# Patient Record
Sex: Female | Born: 1950 | Race: Black or African American | Hispanic: No | Marital: Single | Smoking: Former smoker
Health system: Southern US, Community
[De-identification: ages and names within clinical notes are randomized; demographics above are authoritative.]

## PROBLEM LIST (undated history)

## (undated) DIAGNOSIS — M1990 Unspecified osteoarthritis, unspecified site: Secondary | ICD-10-CM

## (undated) DIAGNOSIS — I330 Acute and subacute infective endocarditis: Secondary | ICD-10-CM

## (undated) DIAGNOSIS — I10 Essential (primary) hypertension: Secondary | ICD-10-CM

## (undated) DIAGNOSIS — S83209A Unspecified tear of unspecified meniscus, current injury, unspecified knee, initial encounter: Secondary | ICD-10-CM

## (undated) DIAGNOSIS — R9431 Abnormal electrocardiogram [ECG] [EKG]: Secondary | ICD-10-CM

## (undated) DIAGNOSIS — R9389 Abnormal findings on diagnostic imaging of other specified body structures: Secondary | ICD-10-CM

## (undated) DIAGNOSIS — R011 Cardiac murmur, unspecified: Secondary | ICD-10-CM

## (undated) DIAGNOSIS — N280 Ischemia and infarction of kidney: Secondary | ICD-10-CM

## (undated) DIAGNOSIS — E785 Hyperlipidemia, unspecified: Secondary | ICD-10-CM

## (undated) DIAGNOSIS — D689 Coagulation defect, unspecified: Secondary | ICD-10-CM

## (undated) HISTORY — PX: POLYPECTOMY: SHX149

## (undated) HISTORY — DX: Abnormal electrocardiogram (ECG) (EKG): R94.31

## (undated) HISTORY — DX: Coagulation defect, unspecified: D68.9

## (undated) HISTORY — DX: Hyperlipidemia, unspecified: E78.5

## (undated) HISTORY — DX: Abnormal findings on diagnostic imaging of other specified body structures: R93.89

---

## 1976-03-27 HISTORY — PX: TUBAL LIGATION: SHX77

## 1997-03-27 HISTORY — PX: KNEE SURGERY: SHX244

## 1998-01-22 ENCOUNTER — Emergency Department (HOSPITAL_COMMUNITY): Admission: EM | Admit: 1998-01-22 | Discharge: 1998-01-22 | Payer: Self-pay | Admitting: Emergency Medicine

## 1998-03-27 DIAGNOSIS — S83209A Unspecified tear of unspecified meniscus, current injury, unspecified knee, initial encounter: Secondary | ICD-10-CM

## 1998-03-27 DIAGNOSIS — M1990 Unspecified osteoarthritis, unspecified site: Secondary | ICD-10-CM

## 1998-03-27 HISTORY — DX: Unspecified tear of unspecified meniscus, current injury, unspecified knee, initial encounter: S83.209A

## 1998-03-27 HISTORY — DX: Unspecified osteoarthritis, unspecified site: M19.90

## 1998-06-24 ENCOUNTER — Ambulatory Visit (HOSPITAL_BASED_OUTPATIENT_CLINIC_OR_DEPARTMENT_OTHER): Admission: RE | Admit: 1998-06-24 | Discharge: 1998-06-24 | Payer: Self-pay | Admitting: *Deleted

## 1998-08-28 ENCOUNTER — Encounter: Payer: Self-pay | Admitting: Emergency Medicine

## 1998-08-28 ENCOUNTER — Emergency Department (HOSPITAL_COMMUNITY): Admission: EM | Admit: 1998-08-28 | Discharge: 1998-08-28 | Payer: Self-pay | Admitting: Emergency Medicine

## 1999-01-11 ENCOUNTER — Other Ambulatory Visit: Admission: RE | Admit: 1999-01-11 | Discharge: 1999-01-11 | Payer: Self-pay | Admitting: Obstetrics

## 1999-03-26 ENCOUNTER — Emergency Department (HOSPITAL_COMMUNITY): Admission: EM | Admit: 1999-03-26 | Discharge: 1999-03-26 | Payer: Self-pay | Admitting: Emergency Medicine

## 1999-03-26 ENCOUNTER — Encounter: Payer: Self-pay | Admitting: Emergency Medicine

## 1999-11-26 ENCOUNTER — Inpatient Hospital Stay (HOSPITAL_COMMUNITY): Admission: AD | Admit: 1999-11-26 | Discharge: 1999-11-26 | Payer: Self-pay | Admitting: Obstetrics

## 2000-01-10 ENCOUNTER — Other Ambulatory Visit: Admission: RE | Admit: 2000-01-10 | Discharge: 2000-01-10 | Payer: Self-pay | Admitting: Obstetrics

## 2000-02-09 ENCOUNTER — Emergency Department (HOSPITAL_COMMUNITY): Admission: EM | Admit: 2000-02-09 | Discharge: 2000-02-09 | Payer: Self-pay | Admitting: Emergency Medicine

## 2000-02-28 ENCOUNTER — Ambulatory Visit (HOSPITAL_COMMUNITY): Admission: RE | Admit: 2000-02-28 | Discharge: 2000-02-28 | Payer: Self-pay | Admitting: Obstetrics

## 2000-02-28 ENCOUNTER — Encounter: Payer: Self-pay | Admitting: Obstetrics

## 2000-04-06 ENCOUNTER — Emergency Department (HOSPITAL_COMMUNITY): Admission: EM | Admit: 2000-04-06 | Discharge: 2000-04-07 | Payer: Self-pay | Admitting: Emergency Medicine

## 2000-12-07 ENCOUNTER — Emergency Department (HOSPITAL_COMMUNITY): Admission: EM | Admit: 2000-12-07 | Discharge: 2000-12-08 | Payer: Self-pay

## 2002-08-03 ENCOUNTER — Emergency Department (HOSPITAL_COMMUNITY): Admission: EM | Admit: 2002-08-03 | Discharge: 2002-08-04 | Payer: Self-pay | Admitting: Emergency Medicine

## 2002-08-04 ENCOUNTER — Encounter: Payer: Self-pay | Admitting: Emergency Medicine

## 2003-03-25 ENCOUNTER — Ambulatory Visit (HOSPITAL_COMMUNITY): Admission: RE | Admit: 2003-03-25 | Discharge: 2003-03-25 | Payer: Self-pay | Admitting: Obstetrics

## 2003-03-27 ENCOUNTER — Emergency Department (HOSPITAL_COMMUNITY): Admission: AD | Admit: 2003-03-27 | Discharge: 2003-03-27 | Payer: Self-pay | Admitting: Internal Medicine

## 2003-06-22 ENCOUNTER — Emergency Department (HOSPITAL_COMMUNITY): Admission: EM | Admit: 2003-06-22 | Discharge: 2003-06-22 | Payer: Self-pay | Admitting: Emergency Medicine

## 2003-10-14 ENCOUNTER — Emergency Department (HOSPITAL_COMMUNITY): Admission: EM | Admit: 2003-10-14 | Discharge: 2003-10-14 | Payer: Self-pay | Admitting: *Deleted

## 2004-01-13 ENCOUNTER — Emergency Department (HOSPITAL_COMMUNITY): Admission: EM | Admit: 2004-01-13 | Discharge: 2004-01-14 | Payer: Self-pay | Admitting: Emergency Medicine

## 2004-05-02 ENCOUNTER — Ambulatory Visit: Payer: Self-pay | Admitting: Internal Medicine

## 2005-01-18 ENCOUNTER — Emergency Department (HOSPITAL_COMMUNITY): Admission: EM | Admit: 2005-01-18 | Discharge: 2005-01-18 | Payer: Self-pay | Admitting: Emergency Medicine

## 2005-08-19 ENCOUNTER — Emergency Department (HOSPITAL_COMMUNITY): Admission: EM | Admit: 2005-08-19 | Discharge: 2005-08-20 | Payer: Self-pay | Admitting: Emergency Medicine

## 2005-11-14 ENCOUNTER — Emergency Department (HOSPITAL_COMMUNITY): Admission: EM | Admit: 2005-11-14 | Discharge: 2005-11-14 | Payer: Self-pay | Admitting: Emergency Medicine

## 2005-11-16 ENCOUNTER — Emergency Department (HOSPITAL_COMMUNITY): Admission: EM | Admit: 2005-11-16 | Discharge: 2005-11-16 | Payer: Self-pay | Admitting: Emergency Medicine

## 2006-02-19 ENCOUNTER — Emergency Department (HOSPITAL_COMMUNITY): Admission: EM | Admit: 2006-02-19 | Discharge: 2006-02-19 | Payer: Self-pay | Admitting: Family Medicine

## 2006-08-10 ENCOUNTER — Emergency Department (HOSPITAL_COMMUNITY): Admission: EM | Admit: 2006-08-10 | Discharge: 2006-08-10 | Payer: Self-pay | Admitting: *Deleted

## 2007-01-11 ENCOUNTER — Emergency Department (HOSPITAL_COMMUNITY): Admission: EM | Admit: 2007-01-11 | Discharge: 2007-01-11 | Payer: Self-pay | Admitting: *Deleted

## 2007-06-04 ENCOUNTER — Emergency Department (HOSPITAL_COMMUNITY): Admission: EM | Admit: 2007-06-04 | Discharge: 2007-06-04 | Payer: Self-pay | Admitting: Emergency Medicine

## 2007-08-10 ENCOUNTER — Emergency Department (HOSPITAL_COMMUNITY): Admission: EM | Admit: 2007-08-10 | Discharge: 2007-08-10 | Payer: Self-pay | Admitting: Emergency Medicine

## 2007-11-29 ENCOUNTER — Emergency Department (HOSPITAL_COMMUNITY): Admission: EM | Admit: 2007-11-29 | Discharge: 2007-11-29 | Payer: Self-pay | Admitting: Emergency Medicine

## 2008-03-27 DIAGNOSIS — I10 Essential (primary) hypertension: Secondary | ICD-10-CM

## 2008-03-27 HISTORY — PX: ABDOMINAL SURGERY: SHX537

## 2008-03-27 HISTORY — PX: COLON SURGERY: SHX602

## 2008-03-27 HISTORY — DX: Essential (primary) hypertension: I10

## 2008-03-28 ENCOUNTER — Emergency Department (HOSPITAL_COMMUNITY): Admission: EM | Admit: 2008-03-28 | Discharge: 2008-03-29 | Payer: Self-pay | Admitting: Emergency Medicine

## 2008-08-06 ENCOUNTER — Emergency Department (HOSPITAL_COMMUNITY): Admission: EM | Admit: 2008-08-06 | Discharge: 2008-08-06 | Payer: Self-pay | Admitting: Emergency Medicine

## 2008-08-08 ENCOUNTER — Inpatient Hospital Stay (HOSPITAL_COMMUNITY): Admission: EM | Admit: 2008-08-08 | Discharge: 2008-08-15 | Payer: Self-pay | Admitting: Emergency Medicine

## 2008-08-08 ENCOUNTER — Emergency Department (HOSPITAL_COMMUNITY): Admission: EM | Admit: 2008-08-08 | Discharge: 2008-08-08 | Payer: Self-pay | Admitting: Emergency Medicine

## 2008-08-16 ENCOUNTER — Inpatient Hospital Stay (HOSPITAL_COMMUNITY): Admission: EM | Admit: 2008-08-16 | Discharge: 2008-08-20 | Payer: Self-pay | Admitting: Emergency Medicine

## 2008-10-24 ENCOUNTER — Emergency Department (HOSPITAL_COMMUNITY): Admission: EM | Admit: 2008-10-24 | Discharge: 2008-10-24 | Payer: Self-pay | Admitting: Emergency Medicine

## 2009-03-04 ENCOUNTER — Emergency Department (HOSPITAL_COMMUNITY): Admission: EM | Admit: 2009-03-04 | Discharge: 2009-03-04 | Payer: Self-pay | Admitting: Emergency Medicine

## 2009-03-27 DIAGNOSIS — E785 Hyperlipidemia, unspecified: Secondary | ICD-10-CM

## 2009-03-27 HISTORY — DX: Hyperlipidemia, unspecified: E78.5

## 2009-06-15 ENCOUNTER — Emergency Department (HOSPITAL_COMMUNITY): Admission: EM | Admit: 2009-06-15 | Discharge: 2009-06-15 | Payer: Self-pay | Admitting: Emergency Medicine

## 2009-08-17 ENCOUNTER — Emergency Department (HOSPITAL_COMMUNITY): Admission: EM | Admit: 2009-08-17 | Discharge: 2009-08-17 | Payer: Self-pay | Admitting: Emergency Medicine

## 2009-10-22 ENCOUNTER — Encounter: Admission: RE | Admit: 2009-10-22 | Discharge: 2009-10-22 | Payer: Self-pay | Admitting: Internal Medicine

## 2009-11-28 ENCOUNTER — Emergency Department (HOSPITAL_COMMUNITY): Admission: EM | Admit: 2009-11-28 | Discharge: 2009-11-28 | Payer: Self-pay | Admitting: Emergency Medicine

## 2010-01-07 ENCOUNTER — Emergency Department (HOSPITAL_COMMUNITY): Admission: EM | Admit: 2010-01-07 | Discharge: 2010-01-08 | Payer: Self-pay | Admitting: Emergency Medicine

## 2010-03-27 DIAGNOSIS — R9431 Abnormal electrocardiogram [ECG] [EKG]: Secondary | ICD-10-CM

## 2010-03-27 HISTORY — DX: Abnormal electrocardiogram (ECG) (EKG): R94.31

## 2010-06-09 LAB — URINALYSIS, ROUTINE W REFLEX MICROSCOPIC
Bilirubin Urine: NEGATIVE
Ketones, ur: NEGATIVE mg/dL
Nitrite: NEGATIVE
Protein, ur: NEGATIVE mg/dL
Urobilinogen, UA: 0.2 mg/dL (ref 0.0–1.0)
pH: 6 (ref 5.0–8.0)

## 2010-06-13 LAB — URINALYSIS, ROUTINE W REFLEX MICROSCOPIC
Bilirubin Urine: NEGATIVE
Glucose, UA: NEGATIVE mg/dL
Hgb urine dipstick: NEGATIVE
Ketones, ur: NEGATIVE mg/dL
Protein, ur: NEGATIVE mg/dL
pH: 5 (ref 5.0–8.0)

## 2010-06-13 LAB — COMPREHENSIVE METABOLIC PANEL
ALT: 15 U/L (ref 0–35)
AST: 20 U/L (ref 0–37)
Albumin: 3.9 g/dL (ref 3.5–5.2)
Alkaline Phosphatase: 73 U/L (ref 39–117)
BUN: 8 mg/dL (ref 6–23)
CO2: 28 mEq/L (ref 19–32)
Calcium: 9.3 mg/dL (ref 8.4–10.5)
Chloride: 106 mEq/L (ref 96–112)
Creatinine, Ser: 0.77 mg/dL (ref 0.4–1.2)
GFR calc Af Amer: >60 mL/min (ref >60)
GFR calc non Af Amer: >60 mL/min (ref >60)
Glucose, Bld: 94 mg/dL (ref 70–99)
Potassium: 4 mEq/L (ref 3.5–5.1)
Sodium: 140 mEq/L (ref 135–145)
Total Bilirubin: 0.4 mg/dL (ref 0.3–1.2)
Total Protein: 6.9 g/dL (ref 6.0–8.3)

## 2010-06-13 LAB — CBC
HCT: 36.1 % (ref 36.0–46.0)
Hemoglobin: 12.3 g/dL (ref 12.0–15.0)
MCHC: 34 g/dL (ref 30.0–36.0)
MCV: 95.4 fL (ref 78.0–100.0)
Platelets: 215 10*3/uL (ref 150–400)
RBC: 3.78 MIL/uL — ABNORMAL LOW (ref 3.87–5.11)
RDW: 14.4 % (ref 11.5–15.5)
WBC: 6.9 10*3/uL (ref 4.0–10.5)

## 2010-06-13 LAB — DIFFERENTIAL
Basophils Absolute: 0.1 10*3/uL (ref 0.0–0.1)
Basophils Relative: 2 % — ABNORMAL HIGH (ref 0–1)
Eosinophils Absolute: 0.2 10*3/uL (ref 0.0–0.7)
Eosinophils Relative: 3 % (ref 0–5)
Lymphocytes Relative: 33 % (ref 12–46)
Lymphs Abs: 2.3 10*3/uL (ref 0.7–4.0)
Monocytes Absolute: 0.4 10*3/uL (ref 0.1–1.0)
Monocytes Relative: 5 % (ref 3–12)
Neutro Abs: 4 10*3/uL (ref 1.7–7.7)
Neutrophils Relative %: 57 % (ref 43–77)

## 2010-06-28 ENCOUNTER — Emergency Department (HOSPITAL_COMMUNITY)
Admission: EM | Admit: 2010-06-28 | Discharge: 2010-06-29 | Disposition: A | Discharge status: Home / Self Care | Payer: BC Managed Care – PPO | Attending: Emergency Medicine | Admitting: Emergency Medicine

## 2010-06-28 DIAGNOSIS — M79609 Pain in unspecified limb: Secondary | ICD-10-CM | POA: Insufficient documentation

## 2010-06-28 DIAGNOSIS — I10 Essential (primary) hypertension: Secondary | ICD-10-CM | POA: Insufficient documentation

## 2010-06-28 DIAGNOSIS — S335XXA Sprain of ligaments of lumbar spine, initial encounter: Secondary | ICD-10-CM | POA: Insufficient documentation

## 2010-06-28 DIAGNOSIS — X500XXA Overexertion from strenuous movement or load, initial encounter: Secondary | ICD-10-CM | POA: Insufficient documentation

## 2010-06-28 DIAGNOSIS — M545 Low back pain, unspecified: Secondary | ICD-10-CM | POA: Insufficient documentation

## 2010-06-28 DIAGNOSIS — Y9289 Other specified places as the place of occurrence of the external cause: Secondary | ICD-10-CM | POA: Insufficient documentation

## 2010-06-28 LAB — POCT CARDIAC MARKERS
Troponin i, poc: <0.05 ng/mL (ref 0.00–0.09)
Troponin i, poc: <0.05 ng/mL (ref 0.00–0.09)

## 2010-06-28 LAB — DIFFERENTIAL
Basophils Absolute: 0.1 10*3/uL (ref 0.0–0.1)
Basophils Relative: 1 % (ref 0–1)
Eosinophils Absolute: 0.2 10*3/uL (ref 0.0–0.7)
Lymphs Abs: 2.4 10*3/uL (ref 0.7–4.0)
Neutrophils Relative %: 63 % (ref 43–77)

## 2010-06-28 LAB — BASIC METABOLIC PANEL
Chloride: 105 mEq/L (ref 96–112)
GFR calc non Af Amer: >60 mL/min (ref >60)
Potassium: 4.1 mEq/L (ref 3.5–5.1)
Sodium: 139 mEq/L (ref 135–145)

## 2010-06-28 LAB — CBC
MCV: 96.8 fL (ref 78.0–100.0)
Platelets: 237 10*3/uL (ref 150–400)
WBC: 8.2 10*3/uL (ref 4.0–10.5)

## 2010-06-28 LAB — D-DIMER, QUANTITATIVE: D-Dimer, Quant: 0.26 ug/mL-FEU (ref 0.00–0.48)

## 2010-07-03 LAB — CBC
MCHC: 31.5 g/dL (ref 30.0–36.0)
Platelets: 224 10*3/uL (ref 150–400)
RDW: 14.1 % (ref 11.5–15.5)

## 2010-07-03 LAB — URINALYSIS, ROUTINE W REFLEX MICROSCOPIC
Nitrite: NEGATIVE
Specific Gravity, Urine: 1.014 (ref 1.005–1.030)
pH: 6 (ref 5.0–8.0)

## 2010-07-03 LAB — COMPREHENSIVE METABOLIC PANEL
ALT: 12 U/L (ref 0–35)
Albumin: 3.8 g/dL (ref 3.5–5.2)
Alkaline Phosphatase: 58 U/L (ref 39–117)
Calcium: 9 mg/dL (ref 8.4–10.5)
Potassium: 3.9 mEq/L (ref 3.5–5.1)
Sodium: 138 mEq/L (ref 135–145)
Total Protein: 6.3 g/dL (ref 6.0–8.3)

## 2010-07-03 LAB — URINE MICROSCOPIC-ADD ON

## 2010-07-03 LAB — DIFFERENTIAL
Basophils Relative: 1 % (ref 0–1)
Lymphs Abs: 2.1 10*3/uL (ref 0.7–4.0)
Monocytes Absolute: 0.5 10*3/uL (ref 0.1–1.0)
Monocytes Relative: 6 % (ref 3–12)
Neutro Abs: 5.2 10*3/uL (ref 1.7–7.7)

## 2010-07-05 LAB — BASIC METABOLIC PANEL
BUN: 9 mg/dL (ref 6–23)
BUN: 9 mg/dL (ref 6–23)
CO2: 22 mEq/L (ref 19–32)
CO2: 31 mEq/L (ref 19–32)
Calcium: 8.3 mg/dL — ABNORMAL LOW (ref 8.4–10.5)
Calcium: 8.6 mg/dL (ref 8.4–10.5)
Calcium: 9.6 mg/dL (ref 8.4–10.5)
Chloride: 103 mEq/L (ref 96–112)
Chloride: 105 mEq/L (ref 96–112)
Chloride: 109 mEq/L (ref 96–112)
Creatinine, Ser: 0.66 mg/dL (ref 0.4–1.2)
Creatinine, Ser: 0.86 mg/dL (ref 0.4–1.2)
GFR calc Af Amer: >60 mL/min (ref >60)
GFR calc Af Amer: >60 mL/min (ref >60)
GFR calc Af Amer: >60 mL/min (ref >60)
GFR calc non Af Amer: >60 mL/min (ref >60)
GFR calc non Af Amer: >60 mL/min (ref >60)
GFR calc non Af Amer: >60 mL/min (ref >60)
GFR calc non Af Amer: >60 mL/min (ref >60)
Glucose, Bld: 101 mg/dL — ABNORMAL HIGH (ref 70–99)
Glucose, Bld: 104 mg/dL — ABNORMAL HIGH (ref 70–99)
Glucose, Bld: 125 mg/dL — ABNORMAL HIGH (ref 70–99)
Glucose, Bld: 135 mg/dL — ABNORMAL HIGH (ref 70–99)
Glucose, Bld: 162 mg/dL — ABNORMAL HIGH (ref 70–99)
Potassium: 3.6 mEq/L (ref 3.5–5.1)
Potassium: 3.6 mEq/L (ref 3.5–5.1)
Potassium: 3.9 mEq/L (ref 3.5–5.1)
Potassium: 4.3 mEq/L (ref 3.5–5.1)
Sodium: 135 mEq/L (ref 135–145)
Sodium: 138 mEq/L (ref 135–145)
Sodium: 140 mEq/L (ref 135–145)
Sodium: 140 mEq/L (ref 135–145)
Sodium: 140 mEq/L (ref 135–145)
Sodium: 141 mEq/L (ref 135–145)

## 2010-07-05 LAB — POCT CARDIAC MARKERS
Troponin i, poc: <0.05 ng/mL (ref 0.00–0.09)
Troponin i, poc: <0.05 ng/mL (ref 0.00–0.09)

## 2010-07-05 LAB — URINALYSIS, ROUTINE W REFLEX MICROSCOPIC
Nitrite: NEGATIVE
Specific Gravity, Urine: 1.017 (ref 1.005–1.030)
pH: 6 (ref 5.0–8.0)

## 2010-07-05 LAB — CBC
HCT: 28.8 % — ABNORMAL LOW (ref 36.0–46.0)
HCT: 28.9 % — ABNORMAL LOW (ref 36.0–46.0)
HCT: 29.1 % — ABNORMAL LOW (ref 36.0–46.0)
HCT: 51.3 % — ABNORMAL HIGH (ref 36.0–46.0)
Hemoglobin: 10 g/dL — ABNORMAL LOW (ref 12.0–15.0)
Hemoglobin: 10 g/dL — ABNORMAL LOW (ref 12.0–15.0)
Hemoglobin: 17.1 g/dL — ABNORMAL HIGH (ref 12.0–15.0)
Hemoglobin: 9.8 g/dL — ABNORMAL LOW (ref 12.0–15.0)
Hemoglobin: 9.8 g/dL — ABNORMAL LOW (ref 12.0–15.0)
MCHC: 33.3 g/dL (ref 30.0–36.0)
MCHC: 34.4 g/dL (ref 30.0–36.0)
MCHC: 33.5 g/dL (ref 30.0–36.0)
MCV: 94.5 fL (ref 78.0–100.0)
MCV: 94.6 fL (ref 78.0–100.0)
MCV: 94.7 fL (ref 78.0–100.0)
MCV: 95.8 fL (ref 78.0–100.0)
Platelets: 178 10*3/uL (ref 150–400)
RBC: 3.07 MIL/uL — ABNORMAL LOW (ref 3.87–5.11)
RBC: 3.1 MIL/uL — ABNORMAL LOW (ref 3.87–5.11)
RBC: 5.36 MIL/uL — ABNORMAL HIGH (ref 3.87–5.11)
RDW: 13.2 % (ref 11.5–15.5)
RDW: 13.4 % (ref 11.5–15.5)
RDW: 13.4 % (ref 11.5–15.5)
RDW: 13.9 % (ref 11.5–15.5)
RDW: 13.7 % (ref 11.5–15.5)
WBC: 6.9 10*3/uL (ref 4.0–10.5)
WBC: 7.6 10*3/uL (ref 4.0–10.5)
WBC: 8.3 10*3/uL (ref 4.0–10.5)
WBC: 8.7 10*3/uL (ref 4.0–10.5)

## 2010-07-05 LAB — DIFFERENTIAL
Basophils Absolute: 0 10*3/uL (ref 0.0–0.1)
Basophils Relative: 0 % (ref 0–1)
Eosinophils Absolute: 0.3 10*3/uL (ref 0.0–0.7)
Eosinophils Relative: 2 % (ref 0–5)
Eosinophils Relative: 5 % (ref 0–5)
Lymphocytes Relative: 24 % (ref 12–46)
Lymphs Abs: 1.9 10*3/uL (ref 0.7–4.0)
Lymphs Abs: 1.6 10*3/uL (ref 0.7–4.0)
Monocytes Absolute: 0.8 10*3/uL (ref 0.1–1.0)
Monocytes Relative: 10 % (ref 3–12)
Monocytes Relative: 9 % (ref 3–12)
Monocytes Relative: 5 % (ref 3–12)
Neutro Abs: 5.9 10*3/uL (ref 1.7–7.7)
Neutrophils Relative %: 68 % (ref 43–77)
Neutrophils Relative %: 65 % (ref 43–77)

## 2010-07-05 LAB — POCT I-STAT, CHEM 8
BUN: 7 mg/dL (ref 6–23)
Calcium, Ion: 1.1 mmol/L — ABNORMAL LOW (ref 1.12–1.32)
Chloride: 103 mEq/L (ref 96–112)
Glucose, Bld: 87 mg/dL (ref 70–99)
TCO2: 25 mmol/L (ref 0–100)

## 2010-07-05 LAB — COMPREHENSIVE METABOLIC PANEL
ALT: 12 U/L (ref 0–35)
AST: 18 U/L (ref 0–37)
Calcium: 9 mg/dL (ref 8.4–10.5)
GFR calc Af Amer: >60 mL/min (ref >60)
Sodium: 141 mEq/L (ref 135–145)
Total Protein: 6.9 g/dL (ref 6.0–8.3)

## 2010-07-05 LAB — URINE MICROSCOPIC-ADD ON

## 2010-07-25 ENCOUNTER — Emergency Department (HOSPITAL_COMMUNITY)
Admission: EM | Admit: 2010-07-25 | Discharge: 2010-07-26 | Disposition: A | Discharge status: Home / Self Care | Payer: Self-pay | Attending: Emergency Medicine | Admitting: Emergency Medicine

## 2010-07-25 DIAGNOSIS — L988 Other specified disorders of the skin and subcutaneous tissue: Secondary | ICD-10-CM | POA: Insufficient documentation

## 2010-07-25 DIAGNOSIS — M545 Low back pain, unspecified: Secondary | ICD-10-CM | POA: Insufficient documentation

## 2010-07-25 DIAGNOSIS — T148XXA Other injury of unspecified body region, initial encounter: Secondary | ICD-10-CM | POA: Insufficient documentation

## 2010-07-25 DIAGNOSIS — I10 Essential (primary) hypertension: Secondary | ICD-10-CM | POA: Insufficient documentation

## 2010-07-25 DIAGNOSIS — X500XXA Overexertion from strenuous movement or load, initial encounter: Secondary | ICD-10-CM | POA: Insufficient documentation

## 2010-07-25 DIAGNOSIS — Y9269 Other specified industrial and construction area as the place of occurrence of the external cause: Secondary | ICD-10-CM | POA: Insufficient documentation

## 2010-07-26 LAB — URINALYSIS, ROUTINE W REFLEX MICROSCOPIC
Glucose, UA: NEGATIVE mg/dL
Ketones, ur: NEGATIVE mg/dL
Specific Gravity, Urine: 1.021 (ref 1.005–1.030)
pH: 5.5 (ref 5.0–8.0)

## 2010-08-09 NOTE — H&P (Signed)
NAME:  [name], [name]              ACCOUNT NO.:  [id number]   MEDICAL RECORD NO.:  [id number]           PATIENT TYPE:   LOCATION:                                 FACILITY:   PHYSICIAN:  Gabrielle Dare. Janee Morn, M.D.DATE OF BIRTH:  November 10, 1950   DATE OF ADMISSION:  DATE OF DISCHARGE:                              HISTORY & PHYSICAL   CHIEF COMPLAINT:  Nausea, vomiting, and abdominal pain.   HISTORY OF PRESENT ILLNESS:  [name] is a 60 year old African  American female who developed generalized abdominal pain at  approximately 3 a.m. this morning.  There was some associated nausea and  vomiting.  The patient came to [facility] Emergency Department.  CT scan  of the abdomen and pelvis was done that demonstrates small-bowel  obstruction that seems to be in her distal small bowel.  The patient has  had bowel movements since she has been here and her pain is somewhat  improved   PAST MEDICAL HISTORY:  Negative.   PAST SURGICAL HISTORY:  Tubal ligation.   ALLERGIES:  PENICILLIN.   MEDICATIONS:  None.   SOCIAL HISTORY:  She smokes cigarettes, does not drink alcohol.  She  works as a Arboriculturist.   REVIEW OF SYSTEMS:  GI complaints as above, otherwise negative.   PHYSICAL EXAMINATION:  VITAL SIGNS:  Temperature 97.2, blood pressure  198/83, heart rate 70, respirations 18.  GENERAL:  She is awake, alert.  HEENT:  Poor dentition.  Pupils are equal.  LUNGS:  Clear to auscultation with good respiratory effort.  HEART:  Regular with no murmurs.  ABDOMEN:  Distended, but soft.  There is mild tenderness.  She has few  bowel sounds present.  No masses are felt.  SKIN:  Warm and dry.  EXTREMITIES:  No edema.  NEUROLOGIC:  No noted deficits.   LABORATORY STUDIES:  Show white blood cell count 6.5, hemoglobin 13.2.  Basic metabolic panel is normal.  Liver function test is within normal  limits.  Lipase is 23.   IMPRESSION AND PLAN:  1. Partial small-bowel obstruction possibly secondary to  adhesions.      Plan will be to admit, give her IV fluids, and      bowel rest.  We will place NG tube if she develops further nausea      and vomiting.  If she does not open up with the next 24-48 hours,      she may need surgery.  2. Hypertension.  We will treat her symptomatically here and she will      need a primary care Giancarlo Askren on discharge.      Gabrielle Dare Janee Morn, M.D.  Electronically Signed     BET/MEDQ  D:  08/08/2008  T:  08/09/2008  Job:  [id number]

## 2010-08-09 NOTE — Op Note (Signed)
NAME:  [name], [name]              ACCOUNT NO.:  [id number]   MEDICAL RECORD NO.:  [id number]           PATIENT TYPE:   LOCATION:                                 FACILITY:   PHYSICIAN:  Sandria Bales. Ezzard Standing, Denise.D.  DATE OF BIRTH:  14-Mar-1951   DATE OF PROCEDURE:  08/09/2008  DATE OF DISCHARGE:                               OPERATIVE REPORT   Date of Surgery - 09 Aug 2008   PREOPERATIVE DIAGNOSIS:  Small bowel obstruction.   POSTOPERATIVE DIAGNOSIS:  Closed loop small bowel obstruction.   PROCEDURE:  Laparoscopic converted to open enterolysis of adhesions.   SURGEON:  Sandria Bales. Ezzard Standing, MD   ANESTHESIA:  General endotracheal.   ESTIMATED BLOOD LOSS:  Minimal.   INDICATIONS FOR PROCEDURE:  [name] is a 60 year old black female who  was admitted to [facility] by Dr. Violeta Gelinas on Aug 08, 2008.  She has no primary medical doctor.  She presented with a bowel  obstruction which after 24 hours of n.p.o. and IV fluids is no better by  x-ray.  Clinically, she looks good, but she also has bumped her white  blood count up, so I discussed with her about proceeding with  laparoscopic exploration and possible open surgery.   The potential complications of exploration include, but are not limited  to, bleeding, infection, the need for bowel resection, and the  possibility I cannot do this laparoscopically I have to do it open.   OPERATIVE NOTE:  The patient placed in a supine position.  Both her arms  were tucked.  She had an OG tube, Foley catheter in place.  Her abdomen  was prepped with DuraPrep and then a time-out was held identifying the  patient and the procedure.   I started with an infraumbilical incision with sharp dissection carried  down to the abdominal cavity.  I placed a 12 mm Hasson trocar into the  infraumbilical incision.  I placed a 5-mm trocar in the right abdomen  and a 5-mm trocar in the left abdomen.  She had noticeably dilated bowel  which already had some  inflammatory exudate and some redness to the  serosa of the bowel.  I tried to get to the pelvis where I could not  find any adhesions and I could not move the bowel well enough without  fear of perforating the bowel and I could not find an adhesive band.   Because the bowel was so distended and I was afraid of injurying the  bowel, I went on and made a midline incision for abdominal exploration.  I eviscerated her small bowel and  pulling the bowel up I snapped an  adhesion that looked like an omental adhesion to the retroperitoneum.  I  identified another adhesions that I divided.  I then ran the entire  small bowel from the ligament of Treitz to the terminal ileum and I  found a close looped obstruction with evidence of a band across the  bowel.  The bowel which was closed loop was hemorrhagic looking but  still appeared viable.  I did not think at  this time she needed a bowel  resection.   I milked some of the contents back to the stomach.  I replaced the OG  with an NG tube.  I sucked out about 500 mL of succus entericus.  I  reinspected the bowel which looked viable.  I irrigated the abdomen with  a liter of saline.  I returned all the bowel to the abdominal cavity.  I  closed the abdomen with 2 running #1 PDS sutures, closed the skin with a  skin staple.   The patient tolerated the procedure well, was transported to recovery  room in good condition.  We will leave her Foley catheter and NG tube in  overnight.  Her sponge and needle count were correct at the end of the  case.      Sandria Bales. Ezzard Standing, Denise.D.  Electronically Signed     DHN/MEDQ  D:  08/09/2008  T:  08/10/2008  Job:  [id number]

## 2010-08-09 NOTE — Discharge Summary (Signed)
[name], [name]              ACCOUNT NO.:  [id number]   MEDICAL RECORD NO.:  [id number]          PATIENT TYPE:  INP   LOCATION:  5121                         FACILITY:  [facility]   PHYSICIAN:  Clovis Pu. Cornett, M.D.DATE OF BIRTH:  01-28-1951   DATE OF ADMISSION:  08/16/2008  DATE OF DISCHARGE:  08/20/2008                               DISCHARGE SUMMARY   CHIEF COMPLAINT/REASON FOR ADMISSION:  Ms. [name] is a 60 year old  female patient who underwent a laparoscopic converted to open  enterolysis of adhesions by Dr. Ezzard Standing on Aug 09, 2008 for a closed loop  small bowel obstruction.  She remained in the hospital for several days  postop had difficulty with an ileus and actually about 48 hours before  discharge had to have her diet placed back to n.p.o./clears on Friday  before discharge because of nausea and vomiting due to the ileus.  Subsequently, she markedly improved over the week and was discharged  home but represented back to the hospital on Aug 16, 2008 for nausea  without emesis and feeling of bloating.  On exam her abdomen was  distended with a few bowel sounds, nontender.  Incisions were okay.  Abdominal x-rays showed air-fluid levels but some air in the colon  consistent with an ileus.  She was subsequently admitted with  postoperative ileus and plans were to make her n.p.o., bowel rest and  began IV fluid hydration.   ADMITTING DIAGNOSES:  1. Postoperative ileus.  2. Uncontrolled hypertension new diagnosis last admission.   HOSPITAL COURSE:  The patient was admitted, placed on n.p.o. status, V  fluid hydration, started later on IV medications, converted to oral  medications for hypertension.  She had been placed on clonidine and  labetalol the previous admission.  Over the next several days, the  patient progressed and was able to have her diet advanced.  Her pain was  controlled with combination of Toradol and Tylenol, minimize the IV  narcotics and by hospital day  #3 which would be postoperative day #10  from the initial surgery the patient was tolerating full liquid diet,  was passing flatus and her diet was advanced to full liquids and pain  medications were changed to Vicodin and p.o. ibuprofen.   By hospital day #4 postoperative day #11 the patient was afebrile, BP  was 149/77, pulse 65 and regular, respirations 18.  She was satting 97%  on room air.  Her abdomen was benign.  Her staples had been removed this  admission.  Steri-Strips were intact.  Bowel sounds were present.  She  had had a bowel movement and was tolerating full liquid diet and was  requesting egg, sausage and grits for breakfast.  Plan was to advance  diet, allow the patient to have a solid food for breakfast and lunch and  if tolerated this could discharge home after 3 p.m. today.   FINAL DISCHARGE DIAGNOSES:  1. Postoperative ileus resolved.  2. Hypertension, better controlled on two medications.   DISCHARGE MEDICATIONS:  The patient was in the following home  medications:  Vicodin 1-2 tablets every 4 hours as needed  for pain.   New medications include:  1. Over-the-counter ibuprofen 2-3 tablets every 8 hours as needed for      pain in addition to Vicodin.  2. Clonidine 0.2 mg b.i.d. for hypertension.  3. Labetalol 200 mg p.o. b.i.d. for hypertension.   OTHER INSTRUCTIONS:  Diet no restrictions.  Return to work in 5 weeks.   ACTIVITY:  Increase activity slowly.  May walk up steps.  May shower.  No lifting more than 15 pounds for 5 weeks.  No driving for 1 week.   WOUND CARE:  Allow Steri-Strips to fall off.   FOLLOW UP:  1. She needs to call Dr. Allene Pyo office to be seen in 1-2 weeks or if      she has made an appointment she needs to keep previous appointment.  2. She is to follow up with her family doctor regarding high blood      pressure and the fact she was started on new medications.  I have      given her two refills on the antihypertensive medications in  the      event she has difficulty getting an appointment with her primary or      establishing with a primary care physician in the event she does      not have a regular primary care physician.      [name], N.P.      Thomas A. Cornett, M.D.  Electronically Signed    ALE/MEDQ  D:  08/20/2008  T:  08/20/2008  Job:  [id number]   cc:   Sandria Bales. Ezzard Standing, M.D.

## 2010-10-13 ENCOUNTER — Other Ambulatory Visit: Payer: Self-pay | Admitting: Internal Medicine

## 2010-10-13 DIAGNOSIS — Z1231 Encounter for screening mammogram for malignant neoplasm of breast: Secondary | ICD-10-CM

## 2010-10-28 ENCOUNTER — Ambulatory Visit
Admission: RE | Admit: 2010-10-28 | Discharge: 2010-10-28 | Disposition: A | Discharge status: Home / Self Care | Payer: BC Managed Care – PPO | Source: Ambulatory Visit | Attending: Internal Medicine | Admitting: Internal Medicine

## 2010-10-28 ENCOUNTER — Ambulatory Visit: Payer: Self-pay

## 2010-10-28 DIAGNOSIS — Z1231 Encounter for screening mammogram for malignant neoplasm of breast: Secondary | ICD-10-CM

## 2010-12-06 ENCOUNTER — Emergency Department (HOSPITAL_COMMUNITY)
Admission: EM | Admit: 2010-12-06 | Discharge: 2010-12-07 | Disposition: A | Discharge status: Home / Self Care | Payer: BC Managed Care – PPO | Attending: Emergency Medicine | Admitting: Emergency Medicine

## 2010-12-06 DIAGNOSIS — Z79899 Other long term (current) drug therapy: Secondary | ICD-10-CM | POA: Insufficient documentation

## 2010-12-06 DIAGNOSIS — L298 Other pruritus: Secondary | ICD-10-CM | POA: Insufficient documentation

## 2010-12-06 DIAGNOSIS — L2989 Other pruritus: Secondary | ICD-10-CM | POA: Insufficient documentation

## 2010-12-06 DIAGNOSIS — I10 Essential (primary) hypertension: Secondary | ICD-10-CM | POA: Insufficient documentation

## 2010-12-06 DIAGNOSIS — R21 Rash and other nonspecific skin eruption: Secondary | ICD-10-CM | POA: Insufficient documentation

## 2010-12-06 DIAGNOSIS — M129 Arthropathy, unspecified: Secondary | ICD-10-CM | POA: Insufficient documentation

## 2011-01-20 ENCOUNTER — Observation Stay (HOSPITAL_COMMUNITY)
Admission: EM | Admit: 2011-01-20 | Discharge: 2011-01-22 | DRG: 464 | Disposition: A | Discharge status: Home / Self Care | Payer: BC Managed Care – PPO | Attending: Internal Medicine | Admitting: Internal Medicine

## 2011-01-20 DIAGNOSIS — R9431 Abnormal electrocardiogram [ECG] [EKG]: Principal | ICD-10-CM | POA: Insufficient documentation

## 2011-01-20 DIAGNOSIS — R5381 Other malaise: Secondary | ICD-10-CM | POA: Insufficient documentation

## 2011-01-20 DIAGNOSIS — I10 Essential (primary) hypertension: Secondary | ICD-10-CM | POA: Insufficient documentation

## 2011-01-20 DIAGNOSIS — E785 Hyperlipidemia, unspecified: Secondary | ICD-10-CM | POA: Insufficient documentation

## 2011-01-20 DIAGNOSIS — J069 Acute upper respiratory infection, unspecified: Secondary | ICD-10-CM | POA: Insufficient documentation

## 2011-01-21 ENCOUNTER — Emergency Department (HOSPITAL_COMMUNITY): Payer: BC Managed Care – PPO

## 2011-01-21 LAB — CBC
HCT: 39.1 % (ref 36.0–46.0)
Hemoglobin: 12.7 g/dL (ref 12.0–15.0)
Hemoglobin: 11.5 g/dL — ABNORMAL LOW (ref 12.0–15.0)
MCH: 31.4 pg (ref 26.0–34.0)
MCH: 31.8 pg (ref 26.0–34.0)
MCHC: 33.7 g/dL (ref 30.0–36.0)
MCV: 96.5 fL (ref 78.0–100.0)
MCV: 94.2 fL (ref 78.0–100.0)
Platelets: 216 10*3/uL (ref 150–400)
RBC: 4.05 MIL/uL (ref 3.87–5.11)
RBC: 3.62 MIL/uL — ABNORMAL LOW (ref 3.87–5.11)
WBC: 13.9 10*3/uL — ABNORMAL HIGH (ref 4.0–10.5)

## 2011-01-21 LAB — HEPATIC FUNCTION PANEL
ALT: 13 U/L (ref 0–35)
Alkaline Phosphatase: 77 U/L (ref 39–117)
Bilirubin, Direct: 0.1 mg/dL (ref 0.0–0.3)
Indirect Bilirubin: 0.5 mg/dL (ref 0.3–0.9)
Total Protein: 6.6 g/dL (ref 6.0–8.3)

## 2011-01-21 LAB — POCT I-STAT TROPONIN I: Troponin i, poc: 0 ng/mL (ref 0.00–0.08)

## 2011-01-21 LAB — POCT I-STAT, CHEM 8
BUN: 16 mg/dL (ref 6–23)
Calcium, Ion: 1.19 mmol/L (ref 1.12–1.32)
Chloride: 103 mEq/L (ref 96–112)
Potassium: 3.7 mEq/L (ref 3.5–5.1)

## 2011-01-21 LAB — DIFFERENTIAL
Lymphocytes Relative: 17 % (ref 12–46)
Lymphs Abs: 2.3 10*3/uL (ref 0.7–4.0)
Monocytes Relative: 5 % (ref 3–12)
Neutro Abs: 10.5 10*3/uL — ABNORMAL HIGH (ref 1.7–7.7)
Neutrophils Relative %: 76 % (ref 43–77)

## 2011-01-21 LAB — URINALYSIS, ROUTINE W REFLEX MICROSCOPIC
Glucose, UA: NEGATIVE mg/dL
Hgb urine dipstick: NEGATIVE
Specific Gravity, Urine: 1.018 (ref 1.005–1.030)

## 2011-01-21 LAB — URINE MICROSCOPIC-ADD ON

## 2011-01-21 LAB — SEDIMENTATION RATE: Sed Rate: 22 mm/hr (ref 0–22)

## 2011-01-22 LAB — URINE CULTURE

## 2011-01-25 NOTE — H&P (Signed)
NAMEKEATON, STIREWALT NO.:  [id number]  MEDICAL RECORD NO.:  [id number]  LOCATION:  2037                         FACILITY:  [facility]  PHYSICIAN:  Tera Mater. Evlyn Kanner, M.D. DATE OF BIRTH:  1950-12-17  DATE OF ADMISSION:  01/20/2011 DATE OF DISCHARGE:                             HISTORY & PHYSICAL   Ms Trego is a 60 year old black female with history of hypertension, hyperlipidemia, and prior abnormal EKG.  She presents today for evaluation of malaise.  The workup included among other things an EKG, which shows some diffuse T-wave changes.  Based on this and the fact that this appeared to have changed, she is brought in for evaluation. She has had a visit with [place] in August that was relatively stable.  She has had known EKG abnormality with the last check on Aug 02, 2010. Clearly, it has changed, however, since then.  She has been relatively healthy.  Her weight has been stable in the past year.  She cannotreally remember her last illnesses of note.  She has some GI issues back in April.  She has had a workup in the past with cardiac issues in our chart and has had an echo.  She has had no breathing trouble or chest pain.  Her bowels have been working well.  She has had no fevers, chills, or sweats.  She has just been a bit achy in the last few days. She notes no headaches or visual complaints.  She has had no nocturia. She has been sleeping well.  Weight has been stable.  She has noticed no peripheral edema.  She has had no awakenings at night.  She does not find it harder to walk across the room or get out come up stairs.  Her only exposure has been she has had her son who had a viral illness.  An echocardiogram dated February 15, 2009, showed normal resting LV function, normal chamber size, normal aortic root, no pericardial effusion, normal mitral tricuspid and aortic valves.  Pulmonic valve was not visualized.  Borderline LVH was present.  No aortic stenosis  or insufficiency was noted.  PAST SURGERY HISTORY: 1. Abnormal EKG. 2. Hyperlipidemia. 3. Hypertension. 4. Prior small bowel obstruction. 5. She has had a left ankle fracture.  SURGICAL SURGERY:  Small bowel obstruction surgery back in 2010, right knee surgery, and a tubal ligation.  SOCIAL HISTORY:  She is a single mother of 2 grown children.  She has not smoked since May 2010.  She has not been a smoker.  Mother has hypertension hyperlipidemia, congestive heart failure, but is not alive.  Father has type 2 diabetes, COPD, and possibly cancer her.  MEDICATION LIST: 1. Clonidine 0.2 twice a day. 2. Trandate or labetalol 200 mg twice daily. 3. Meloxicam 15 once daily. 4. Benicar 40 once daily.  ALLERGIES:  Listed as PENICILLIN.  PHYSICAL EXAMINATION:  Here on the floor. VITAL SIGNS:  Blood pressure is 164/70, pulse 83, respirations 14, temperature 99.1, and O2 saturation 99%. GENERAL:  She is a healthy-appearing black female with appearance consistent with stated age. HEENT:  Extraocular movements are intact.  There is no lid lag, exophthalmos, or periorbital edema.  Eyes  are conjugate.  Oral mucous membranes are moist and pharynx appears clear. NECK:  Supple.  No thyroid enlargement.  No bruits are heard. LUNGS:  Clear without wheezes, rales, or rhonchi.  She is moving air well.  No accessory muscle use. HEART:  Regular with systolic murmur that is soft.  I do not hear any extra sounds. ABDOMEN:  Soft and nontender with no masses. EXTREMITIES:  Strong distal pulses.  No edema. NEUROLOGIC:  The patient is awake and alert.  She is mentating well. Speech is clear.  No tremors present. SKIN:  No pigmentary changes or acanthosis.  No evidence hallucination or delusions.  LAB DATA:  Her troponins have been negative now for cycle to 2.  Urine was specific gravity 1.018, pH 5.5, small leukocytes, but only 3-6 white cells.  White count is 13,900, hemoglobin 12.7, and platelets  236,000. Chemistries:  Sodium was 139, potassium 3.8, chloride 103, CO2 was 27, BUN 16, creatinine 0.80, glucose is 94.  RADIOLOGY TESTING:  Two-view chest, no evidence acute cardiopulmonary disease.  No cardiomegaly is noted.  In summary, we have a 60 year old black female presenting with fairly nonspecific symptoms of malaise and now diffuse EKG changes.  Her EKG reviewed here does not show flipped Ts especially in V2 through V6, now with deep downward deflections.  Also changed are in II, III, and F, these II, II, and F also deeply down.  Flat Ts are present in aVL and V1.  This clearly has a bit progressed since the last check.  The differential of these diffuse EKG changes is large.  I do not think this represents a myocardial infarction.  We could be seeing a myocarditis or pleural effusion or perhaps a hypothyroidism.  At the present time, Cardiology was called, but declined to admit.  We are going to check an echo, a sedimentation rate, and TSH.  I suspect this should be a short hospital stay.  There is no evidence of any ongoing ischemia.  Some of the workup may need to be done as an outpatient.  Certainly if ejection fraction is down or other problems, we will be following those as needed.          ______________________________ Tera Mater Evlyn Kanner, M.D.     SAS/MEDQ  D:  01/21/2011  T:  01/21/2011  Job:  [id number]  Electronically Signed by Adrian Prince M.D. on 01/25/2011 04:10:37 PM

## 2011-01-25 NOTE — Discharge Summary (Signed)
  [name], KIDNEY NO.:  [id number]  MEDICAL RECORD NO.:  [id number]  LOCATION:  2037                         FACILITY:  [facility]  PHYSICIAN:  [name], M.D. DATE OF BIRTH:  1951-01-18  DATE OF ADMISSION:  01/21/2011 DATE OF DISCHARGE:  01/22/2011                              DISCHARGE SUMMARY   DISCHARGE DIAGNOSES: 1. Abnormal EKG, echo pending.  No further cardiac issues. 2. Generalized myalgias and weakness, clinically somewhat improved.  CONSULTATIONS:  None.  PROCEDURES:  Echocardiogram.  [name] is a 60 year old, black female who presented the emergency room with weakness.  An EKG was done and found diffuse T-wave abnormalities.  Cardiology was called and they refused to admit and somehow I have not come by to see the patient either.  Fortunately, the patient has done particularly well.  She has had an echo, results of which are pending.  She is feeling stronger.  The achiness is gone away. She has only had one low-grade fever to 99.3 and none since.  Blood pressure is stable at 122/64.  She is eating well.  I think we can continue this workup as an outpatient.  She has no chest pain or shortness of breath.  LAB DATA:  Thus far included TSH is normal at 1.724.  Sedimentation rate is 22.  Liver function testing was perfectly normal.  White count was 10,300, hemoglobin 10.5, and platelets 216,000.  Troponin was cycled and was 0.00 and 0.00.  Urine microscopic with 3-6 whites, 0-2 reds, yellow and cloudy with small leukocytes. Initial white count was 13,900.  I- STAT revealed a sodium of 139, potassium 3.7, chloride 103, CO2 is 27, BUN 16, creatinine 0.8, glucose of 94.  Two-view chest x-ray showed no active cardiopulmonary disease.  Her EKG is actually fairly abnormal, shows a sinus rhythm.  There are deep T-wave inversions in lead II, more minor in leads 3 and F, deep inversions in V2, V3, V4, V5, and lesser in V6.  V1 is sort of flat,  aVL is flat and minimal inversion in lead 1. She is upright in aVR.  This was a difference from before.  In summary, we have admission of a 60 year old black female with weakness and abnormal EKG.  Workup is still in progress.  Hopefully, we can resolve these issues fairly promptly.  I do not see any direct evidence of myocarditis from the lab testing.  There is not a thyroid dysfunction.  She has followup tomorrow in our office.          ______________________________ [name], M.D.     SAS/MEDQ  D:  01/22/2011  T:  01/22/2011  Job:  [id number]  Electronically Signed by Adrian Prince M.D. on 01/25/2011 04:10:40 PM

## 2011-02-22 ENCOUNTER — Encounter: Payer: Self-pay | Admitting: Emergency Medicine

## 2011-02-22 ENCOUNTER — Emergency Department (HOSPITAL_COMMUNITY)
Admission: EM | Admit: 2011-02-22 | Discharge: 2011-02-23 | Disposition: A | Discharge status: Home / Self Care | Payer: BC Managed Care – PPO | Attending: Emergency Medicine | Admitting: Emergency Medicine

## 2011-02-22 DIAGNOSIS — M25561 Pain in right knee: Secondary | ICD-10-CM

## 2011-02-22 DIAGNOSIS — M7989 Other specified soft tissue disorders: Secondary | ICD-10-CM | POA: Insufficient documentation

## 2011-02-22 DIAGNOSIS — M25562 Pain in left knee: Secondary | ICD-10-CM

## 2011-02-22 DIAGNOSIS — M25569 Pain in unspecified knee: Secondary | ICD-10-CM | POA: Insufficient documentation

## 2011-02-22 DIAGNOSIS — I10 Essential (primary) hypertension: Secondary | ICD-10-CM | POA: Insufficient documentation

## 2011-02-22 DIAGNOSIS — M25469 Effusion, unspecified knee: Secondary | ICD-10-CM | POA: Insufficient documentation

## 2011-02-22 HISTORY — DX: Essential (primary) hypertension: I10

## 2011-02-22 NOTE — ED Notes (Signed)
C/o bilateral knees swelling x 1 week.  No known injury.

## 2011-02-23 ENCOUNTER — Emergency Department (HOSPITAL_COMMUNITY): Payer: BC Managed Care – PPO

## 2011-02-23 MED ORDER — OXYCODONE-ACETAMINOPHEN 5-325 MG PO TABS: 1.0000 | ORAL_TABLET | ORAL | Status: AC | PRN

## 2011-02-23 MED ORDER — IBUPROFEN 200 MG PO TABS
600.0000 mg | ORAL_TABLET | Freq: Once | ORAL | Status: AC
  Administered 2011-02-23: 600 mg via ORAL
  Filled 2011-02-23: qty 3

## 2011-02-23 MED ORDER — OXYCODONE-ACETAMINOPHEN 5-325 MG PO TABS
2.0000 | ORAL_TABLET | Freq: Once | ORAL | Status: AC
  Administered 2011-02-23: 2 via ORAL
  Filled 2011-02-23: qty 2

## 2011-02-23 NOTE — ED Provider Notes (Signed)
History     CSN: [id number] Arrival date & time: 02/22/2011 11:53 PM   First MD Initiated Contact with Patient 02/23/11 0210      Chief Complaint  Patient presents with  . Joint Swelling    (Consider location/radiation/quality/duration/timing/severity/associated sxs/prior treatment) The history is provided by the patient.   patient reports bilateral knee pain right greater than left for 1 week.  Her symptoms are worsened by ambulating.  Improved by nothing.  She's tried her Mobic at home without improvement.  She reports she's had knee pain intermittently for years however it worsened over the last week.  She denies fevers and chills.  She denies erythema.  She does report some mild swelling to the right knee.  Her pain is constant and it is moderate in severity  Past Medical History  Diagnosis Date  . Hypertension     History reviewed. No pertinent past surgical history.  No family history on file.  History  Substance Use Topics  . Smoking status: Former Games developer  . Smokeless tobacco: Not on file  . Alcohol Use: No    OB History    Grav Para Term Preterm Abortions TAB SAB Ect Mult Living                  Review of Systems  All other systems reviewed and are negative.    Allergies  Penicillins  Home Medications   Current Outpatient Rx  Name Route Sig Dispense Refill  . CLONIDINE HCL 0.2 MG PO TABS Oral Take 0.2 mg by mouth 2 (two) times daily.      Marland Kitchen LABETALOL HCL 200 MG PO TABS Oral Take 200 mg by mouth 2 (two) times daily.      . MELOXICAM 15 MG PO TABS Oral Take 15 mg by mouth daily.      Carma Leaven M PLUS PO TABS Oral Take 1 tablet by mouth daily.      Marland Kitchen RABEPRAZOLE SODIUM 20 MG PO TBEC Oral Take 20 mg by mouth daily.        BP 189/74  Pulse 59  Temp(Src) 97.4 F (36.3 C) (Oral)  Resp 18  SpO2 99%  Physical Exam  Constitutional: She is oriented to person, place, and time. She appears well-developed and well-nourished.  HENT:  Head: Normocephalic.    Eyes: EOM are normal.  Neck: Normal range of motion.  Pulmonary/Chest: Effort normal.  Musculoskeletal: Normal range of motion.       All joint effusion of right knee.  Right knee with mild pain with range of motion however the patient is able to fully range that knee.  Right knee without erythema or warmth.  Left knee without evidence of joint effusion.  There is no erythema or warmth of the left knee.  Bilateral lower extremities with normal pulses and sensation bilateral  Neurological: She is alert and oriented to person, place, and time.  Psychiatric: She has a normal mood and affect.    ED Course  Procedures (including critical care time)  Labs Reviewed - No data to display Dg Knee Complete 4 Views Left  02/23/2011  *RADIOLOGY REPORT*  Clinical Data: Left knee pain and swelling.  LEFT KNEE - COMPLETE 4+ VIEW  Comparison: Left tibia / fibula radiographs performed 02/19/2006  Findings: There is no evidence of fracture or dislocation.  The joint spaces are preserved.  No significant degenerative change is seen; the patellofemoral joint is grossly unremarkable in appearance.  No significant joint effusion is seen.  The visualized soft tissues are normal in appearance.  IMPRESSION: No evidence of fracture or dislocation.  Original Report Authenticated By: Tonia Ghent, M.D.   Dg Knee Complete 4 Views Right  02/23/2011  *RADIOLOGY REPORT*  Clinical Data: Right knee pain and swelling.  RIGHT KNEE - COMPLETE 4+ VIEW  Comparison: None.  Findings: There is no evidence of fracture or dislocation.  The joint spaces are preserved.  Marginal osteophytes are seen arising at the medial and lateral compartments.  A small joint effusion is seen, and there is mild apparent edema within Hoffa's fat pad.  The visualized soft tissues are otherwise unremarkable in appearance.  IMPRESSION:  1.  No evidence of fracture or dislocation. 2.  Small joint effusion seen; mild apparent edema within Hoffa's fat pad. 3.   Mild degenerative change at the right knee.  Original Report Authenticated By: Tonia Ghent, M.D.   Personally reviewed the x-rays  1. Pain in right knee   2. Pain in left knee       MDM  I suspect this is arthritis of her right knee.  She also reports some pain in her left knee however the right is her major complaint.  Doubt septic arthritis.  Doubt gout.  Home with pain medicine.  Patient will be referred to both orthopedics and back to her primary care physician.  Having some unreasonable for the primary care physician to initiate a basic rheumatologic workup as an outpatient.        Lyanne Co, MD 02/23/11 [phone]

## 2011-02-23 NOTE — ED Notes (Signed)
Patient c/o bilateral knee pain , states her knees have been hurting x 1 week. Denies injury.

## 2011-04-13 ENCOUNTER — Other Ambulatory Visit: Payer: Self-pay | Admitting: Oncology

## 2011-05-20 ENCOUNTER — Emergency Department (HOSPITAL_COMMUNITY): Payer: BC Managed Care – PPO

## 2011-05-20 ENCOUNTER — Emergency Department (HOSPITAL_COMMUNITY)
Admission: EM | Admit: 2011-05-20 | Discharge: 2011-05-20 | Disposition: A | Discharge status: Home / Self Care | Payer: BC Managed Care – PPO | Attending: Emergency Medicine | Admitting: Emergency Medicine

## 2011-05-20 ENCOUNTER — Encounter (HOSPITAL_COMMUNITY): Payer: Self-pay | Admitting: *Deleted

## 2011-05-20 DIAGNOSIS — H9209 Otalgia, unspecified ear: Secondary | ICD-10-CM | POA: Insufficient documentation

## 2011-05-20 DIAGNOSIS — M25569 Pain in unspecified knee: Secondary | ICD-10-CM | POA: Insufficient documentation

## 2011-05-20 DIAGNOSIS — M25469 Effusion, unspecified knee: Secondary | ICD-10-CM | POA: Insufficient documentation

## 2011-05-20 DIAGNOSIS — G8929 Other chronic pain: Secondary | ICD-10-CM | POA: Insufficient documentation

## 2011-05-20 DIAGNOSIS — S00419A Abrasion of unspecified ear, initial encounter: Secondary | ICD-10-CM

## 2011-05-20 DIAGNOSIS — M25561 Pain in right knee: Secondary | ICD-10-CM

## 2011-05-20 HISTORY — DX: Unspecified osteoarthritis, unspecified site: M19.90

## 2011-05-20 MED ORDER — CIPROFLOXACIN-HYDROCORTISONE 0.2-1 % OT SUSP
3.0000 [drp] | Freq: Two times a day (BID) | OTIC | Status: DC
  Administered 2011-05-20: 3 [drp] via OTIC
  Filled 2011-05-20: qty 10

## 2011-05-20 NOTE — ED Provider Notes (Signed)
History     CSN: [id number]  Arrival date & time 05/20/11  1916   First MD Initiated Contact with Patient 05/20/11 2139      Chief Complaint  Patient presents with  . Otalgia  . Knee Pain    (Consider location/radiation/quality/duration/timing/severity/associated sxs/prior treatment) Patient is a 61 y.o. female presenting with ear pain and knee pain. The history is provided by the patient.  Otalgia This is a new problem. The current episode started yesterday. There is pain in the right ear. The problem occurs constantly. The problem has not changed since onset.There has been no fever. The pain is moderate. Pertinent negatives include no headaches, no hearing loss, no rhinorrhea, no sore throat, no abdominal pain, no vomiting, no neck pain, no cough and no rash. Ear discharge: small amt blood.  Knee Pain This is a chronic (no new injury) problem. The current episode started more than 1 year ago. The problem occurs intermittently. The problem has been waxing and waning. Associated symptoms include joint swelling. Pertinent negatives include no abdominal pain, chest pain, chills, congestion, coughing, fever, headaches, nausea, neck pain, rash, sore throat, vomiting or weakness. The symptoms are aggravated by walking. She has tried nothing for the symptoms.    Past Medical History  Diagnosis Date  . Hypertension   . Arthritis     Past Surgical History  Procedure Date  . Abdominal surgery   . Knee surgery     No family history on file.  History  Substance Use Topics  . Smoking status: Former Games developer  . Smokeless tobacco: Not on file  . Alcohol Use: No     Review of Systems  Constitutional: Negative for fever and chills.  HENT: Positive for ear pain. Negative for hearing loss, nosebleeds, congestion, sore throat, rhinorrhea, trouble swallowing, neck pain and neck stiffness. Ear discharge: small amt blood.   Eyes: Positive for visual disturbance. Negative for pain.    Respiratory: Negative for cough and shortness of breath.   Cardiovascular: Negative for chest pain.  Gastrointestinal: Negative for nausea, vomiting and abdominal pain.  Musculoskeletal: Positive for joint swelling. Negative for back pain and gait problem.  Skin: Negative for rash and wound.  Neurological: Negative for dizziness, syncope, weakness and headaches.  Psychiatric/Behavioral: Negative for confusion.    Allergies  Penicillins  Home Medications   Current Outpatient Rx  Name Route Sig Dispense Refill  . ANTIPYRINE-BENZOCAINE 5.4-1.4 % OT SOLN Both Ears Place 3 drops into both ears every 2 (two) hours as needed. For ear pain    . CLONIDINE HCL 0.2 MG PO TABS Oral Take 0.2 mg by mouth 2 (two) times daily.     Marland Kitchen LABETALOL HCL 200 MG PO TABS Oral Take 200 mg by mouth 2 (two) times daily.      Carma Leaven M PLUS PO TABS Oral Take 1 tablet by mouth daily.        BP 131/64  Pulse 65  Temp(Src) 98.2 F (36.8 C) (Oral)  SpO2 99%  Physical Exam  Nursing note and vitals reviewed. Constitutional: She is oriented to person, place, and time. She appears well-developed and well-nourished. No distress.  HENT:  Head: Normocephalic and atraumatic.  Nose: Nose normal.  Mouth/Throat: Oropharynx is clear and moist. No oropharyngeal exudate.       Abrasion to 6:00 location in the right ear canal with small amount of dried blood present. No tenderness to palpation of the tragus or pinna. No posterior auricular pain to palpation. Bilateral TMs  are normal. Hearing is intact to finger rub bilaterally.  Eyes: Conjunctivae are normal. Pupils are equal, round, and reactive to light.  Neck: Normal range of motion. Neck supple.  Cardiovascular: Normal rate and regular rhythm.   Pulmonary/Chest: Effort normal. No respiratory distress.  Abdominal: Soft. She exhibits no distension. There is no tenderness.  Musculoskeletal: Normal range of motion.       Right knee: She exhibits swelling. She exhibits  normal range of motion, no ecchymosis, no deformity, no erythema, normal alignment, no LCL laxity, normal patellar mobility and no MCL laxity. tenderness found. Lateral joint line tenderness noted. No medial joint line and no patellar tendon tenderness noted.       Legs: Lymphadenopathy:    She has no cervical adenopathy.  Neurological: She is alert and oriented to person, place, and time. No cranial nerve deficit.  Skin: Skin is warm and dry. No rash noted.  Psychiatric: She has a normal mood and affect.    ED Course  Procedures (including critical care time)  Labs Reviewed - No data to display Dg Knee Complete 4 Views Right  05/20/2011  *RADIOLOGY REPORT*  Clinical Data: 61 year old female with pain and swelling.  RIGHT KNEE - COMPLETE 4+ VIEW  Comparison: 02/23/2011.  Findings: Moderate joint effusion similar to prior exam. Osteopenia.  Stable joint spaces.  Lateral compartment degenerative spurring.  No acute fracture or dislocation.  IMPRESSION: Moderate joint effusion.  No acute fracture or dislocation.  Original Report Authenticated By: Ulla Potash III, M.D.     1. Right knee pain   2. Ear canal abrasion       MDM  Chronic knee pain no new injury. X-ray with moderate joint effusion. Mild tenderness to palpation over the lateral joint line. Knee sleeve placed for comfort and 2-D been reduction of swelling. Discussed with patient that an orthopedic followup may be appropriate given the duration of her pain.  Abrasion to the right ear canal. No surrounding erythema or other signs of infection. Will place the patient on a short course of antibiotic ear drops to prevent infection. Advised against use of Q-tips in the ear.        Shaaron Adler, [place] 05/20/11 2228

## 2011-05-20 NOTE — Discharge Instructions (Signed)
Use the antibiotic ear drops twice a day for the next one week. You can use ibuprofen as needed for pain. Wear the knee sleeve as needed to help reduce swelling and for comfort.     Knee Pain The knee is the complex joint between your thigh and your lower leg. It is made up of bones, tendons, ligaments, and cartilage. The bones that make up the knee are:  The femur in the thigh.   The tibia and fibula in the lower leg.   The patella or kneecap riding in the groove on the lower femur.  CAUSES  Knee pain is a common complaint with many causes. A few of these causes are:  Injury, such as:   A ruptured ligament or tendon injury.   Torn cartilage.   Medical conditions, such as:   Gout   Arthritis   Infections   Overuse, over training or overdoing a physical activity.  Knee pain can be minor or severe. Knee pain can accompany debilitating injury. Minor knee problems often respond well to self-care measures or get well on their own. More serious injuries may need medical intervention or even surgery. SYMPTOMS The knee is complex. Symptoms of knee problems can vary widely. Some of the problems are:  Pain with movement and weight bearing.   Swelling and tenderness.   Buckling of the knee.   Inability to straighten or extend your knee.   Your knee locks and you cannot straighten it.   Warmth and redness with pain and fever.   Deformity or dislocation of the kneecap.  DIAGNOSIS  Determining what is wrong may be very straight forward such as when there is an injury. It can also be challenging because of the complexity of the knee. Tests to make a diagnosis may include:  Your caregiver taking a history and doing a physical exam.   Routine X-rays can be used to rule out other problems. X-rays will not reveal a cartilage tear. Some injuries of the knee can be diagnosed by:   Arthroscopy a surgical technique by which a small video camera is inserted through tiny incisions on the  sides of the knee. This procedure is used to examine and repair internal knee joint problems. Tiny instruments can be used during arthroscopy to repair the torn knee cartilage (meniscus).   Arthrography is a radiology technique. A contrast liquid is directly injected into the knee joint. Internal structures of the knee joint then become visible on X-ray film.   An MRI scan is a non x-ray radiology procedure in which magnetic fields and a computer produce two- or three-dimensional images of the inside of the knee. Cartilage tears are often visible using an MRI scanner. MRI scans have largely replaced arthrography in diagnosing cartilage tears of the knee.   Blood work.   Examination of the fluid that helps to lubricate the knee joint (synovial fluid). This is done by taking a sample out using a needle and a syringe.  TREATMENT The treatment of knee problems depends on the cause. Some of these treatments are:  Depending on the injury, proper casting, splinting, surgery or physical therapy care will be needed.   Give yourself adequate recovery time. Do not overuse your joints. If you begin to get sore during workout routines, back off. Slow down or do fewer repetitions.   For repetitive activities such as cycling or running, maintain your strength and nutrition.   Alternate muscle groups. For example if you are a weight lifter, work  the upper body on one day and the lower body the next.   Either tight or weak muscles do not give the proper support for your knee. Tight or weak muscles do not absorb the stress placed on the knee joint. Keep the muscles surrounding the knee strong.   Take care of mechanical problems.   If you have flat feet, orthotics or special shoes may help. See your caregiver if you need help.   Arch supports, sometimes with wedges on the inner or outer aspect of the heel, can help. These can shift pressure away from the side of the knee most bothered by osteoarthritis.   A  brace called an "unloader" brace also may be used to help ease the pressure on the most arthritic side of the knee.   If your caregiver has prescribed crutches, braces, wraps or ice, use as directed. The acronym for this is PRICE. This means protection, rest, ice, compression and elevation.   Nonsteroidal anti-inflammatory drugs (NSAID's), can help relieve pain. But if taken immediately after an injury, they may actually increase swelling. Take NSAID's with food in your stomach. Stop them if you develop stomach problems. Do not take these if you have a history of ulcers, stomach pain or bleeding from the bowel. Do not take without your caregiver's approval if you have problems with fluid retention, heart failure, or kidney problems.   For ongoing knee problems, physical therapy may be helpful.   Glucosamine and chondroitin are over-the-counter dietary supplements. Both may help relieve the pain of osteoarthritis in the knee. These medicines are different from the usual anti-inflammatory drugs. Glucosamine may decrease the rate of cartilage destruction.   Injections of a corticosteroid drug into your knee joint may help reduce the symptoms of an arthritis flare-up. They may provide pain relief that lasts a few months. You may have to wait a few months between injections. The injections do have a small increased risk of infection, water retention and elevated blood sugar levels.   Hyaluronic acid injected into damaged joints may ease pain and provide lubrication. These injections may work by reducing inflammation. A series of shots may give relief for as long as 6 months.   Topical painkillers. Applying certain ointments to your skin may help relieve the pain and stiffness of osteoarthritis. Ask your pharmacist for suggestions. Many over the-counter products are approved for temporary relief of arthritis pain.   In some countries, doctors often prescribe topical NSAID's for relief of chronic conditions  such as arthritis and tendinitis. A review of treatment with NSAID creams found that they worked as well as oral medications but without the serious side effects.  PREVENTION  Maintain a healthy weight. Extra pounds put more strain on your joints.   Get strong, stay limber. Weak muscles are a common cause of knee injuries. Stretching is important. Include flexibility exercises in your workouts.   Be smart about exercise. If you have osteoarthritis, chronic knee pain or recurring injuries, you may need to change the way you exercise. This does not mean you have to stop being active. If your knees ache after jogging or playing basketball, consider switching to swimming, water aerobics or other low-impact activities, at least for a few days a week. Sometimes limiting high-impact activities will provide relief.   Make sure your shoes fit well. Choose footwear that is right for your sport.   Protect your knees. Use the proper gear for knee-sensitive activities. Use kneepads when playing volleyball or laying carpet. Buckle  your seat belt every time you drive. Most shattered kneecaps occur in car accidents.   Rest when you are tired.  SEEK MEDICAL CARE IF:  You have knee pain that is continual and does not seem to be getting better.  SEEK IMMEDIATE MEDICAL CARE IF:  Your knee joint feels hot to the touch and you have a high fever. MAKE SURE YOU:   Understand these instructions.   Will watch your condition.   Will get help right away if you are not doing well or get worse.  Document Released: 01/08/2007 Document Revised: 11/23/2010 Document Reviewed: 01/08/2007 [facility] Patient Information 2012 [place], [place].       Abrasions Abrasions are skin scrapes. Their treatment depends on how large and deep the abrasion is. Abrasions do not extend through all layers of the skin. A cut or lesion through all skin layers is called a laceration. HOME CARE INSTRUCTIONS   If you were given a dressing,  change it at least once a day or as instructed by your caregiver. If the bandage sticks, soak it off with a solution of water or hydrogen peroxide.   Twice a day, wash the area with soap and water to remove all the cream/ointment. You may do this in a sink, under a tub faucet, or in a shower. Rinse off the soap and pat dry with a clean towel. Look for signs of infection (see below).   Reapply cream/ointment according to your caregiver's instruction. This will help prevent infection and keep the bandage from sticking. Telfa or gauze over the wound and under the dressing or wrap will also help keep the bandage from sticking.   If the bandage becomes wet, dirty, or develops a foul smell, change it as soon as possible.   Only take over-the-counter or prescription medicines for pain, discomfort, or fever as directed by your caregiver.  SEEK IMMEDIATE MEDICAL CARE IF:   Increasing pain in the wound.   Signs of infection develop: redness, swelling, surrounding area is tender to touch, or pus coming from the wound.   You have a fever.   Any foul smell coming from the wound or dressing.  Most skin wounds heal within ten days. Facial wounds heal faster. However, an infection may occur despite proper treatment. You should have the wound checked for signs of infection within 24 to 48 hours or sooner if problems arise. If you were not given a wound-check appointment, look closely at the wound yourself on the second day for early signs of infection listed above. MAKE SURE YOU:   Understand these instructions.   Will watch your condition.   Will get help right away if you are not doing well or get worse.  Document Released: 12/21/2004 Document Revised: 11/23/2010 Document Reviewed: 10/30/2007 [facility] Patient Information 2012 [place], [place].

## 2011-05-20 NOTE — ED Notes (Signed)
Called ortho tech to apply knee sleeve.

## 2011-05-20 NOTE — Progress Notes (Signed)
Orthopedic Tech Progress Note Patient Details:  [name] 01-Jul-1950 [id number]  Other Ortho Devices Type of Ortho Device: Knee Sleeve Ortho Device Location: (R) LE Ortho Device Interventions: Casandra Doffing 05/20/2011, 10:06 PM

## 2011-05-20 NOTE — ED Notes (Signed)
Pt is here for pain in her right ear that began this morning.  Pt states that it feels like an ear infection.  Pt has had some bleeding from her right ear since she cleaned it this am.  Pt can hear out of her right ear.  Pt also has pain in her right knee, pt states that she arthritis in that knee and she feels like it is flared up.  No injury with this.  Pt ambulatory.  No fever

## 2011-05-21 NOTE — ED Provider Notes (Signed)
Medical screening examination/treatment/procedure(s) were performed by non-physician practitioner and as supervising physician I was immediately available for consultation/collaboration.   Lyanne Co, MD 05/21/11 [phone]

## 2011-06-01 ENCOUNTER — Encounter (HOSPITAL_COMMUNITY): Payer: Self-pay | Admitting: Emergency Medicine

## 2011-06-01 ENCOUNTER — Other Ambulatory Visit: Payer: Self-pay

## 2011-06-01 ENCOUNTER — Emergency Department (HOSPITAL_COMMUNITY)
Admission: EM | Admit: 2011-06-01 | Discharge: 2011-06-01 | Disposition: A | Discharge status: Home / Self Care | Payer: BC Managed Care – PPO | Attending: Emergency Medicine | Admitting: Emergency Medicine

## 2011-06-01 ENCOUNTER — Emergency Department (HOSPITAL_COMMUNITY): Payer: BC Managed Care – PPO

## 2011-06-01 DIAGNOSIS — R51 Headache: Secondary | ICD-10-CM | POA: Insufficient documentation

## 2011-06-01 DIAGNOSIS — M542 Cervicalgia: Secondary | ICD-10-CM | POA: Insufficient documentation

## 2011-06-01 DIAGNOSIS — I10 Essential (primary) hypertension: Secondary | ICD-10-CM | POA: Insufficient documentation

## 2011-06-01 DIAGNOSIS — H9209 Otalgia, unspecified ear: Secondary | ICD-10-CM | POA: Insufficient documentation

## 2011-06-01 DIAGNOSIS — M129 Arthropathy, unspecified: Secondary | ICD-10-CM | POA: Insufficient documentation

## 2011-06-01 DIAGNOSIS — Z79899 Other long term (current) drug therapy: Secondary | ICD-10-CM | POA: Insufficient documentation

## 2011-06-01 LAB — POCT I-STAT, CHEM 8
BUN: 17 mg/dL (ref 6–23)
Chloride: 107 mEq/L (ref 96–112)
Creatinine, Ser: 0.7 mg/dL (ref 0.50–1.10)
Hemoglobin: 13.6 g/dL (ref 12.0–15.0)
Potassium: 3.6 mEq/L (ref 3.5–5.1)
Sodium: 142 mEq/L (ref 135–145)

## 2011-06-01 NOTE — ED Notes (Signed)
Patient is resting comfortably. 

## 2011-06-01 NOTE — ED Notes (Signed)
Pt ambulated with a steady gait;VSS; A&Ox3; no signs of distress; respirations even and unlabored; skin warm and dry; no questions at this time.  

## 2011-06-01 NOTE — ED Notes (Signed)
Pt. Reports elevated blood pressure today , states checked her bp at [facility] = 188/71.  Denies nausea or headache.

## 2011-06-01 NOTE — ED Notes (Signed)
Pt returned from CT; no signs of distress.  

## 2011-06-01 NOTE — ED Notes (Signed)
Pt complaining chair uncomfortable; moved to room with a bed.

## 2011-06-01 NOTE — ED Notes (Signed)
Patient transported to CT 

## 2011-06-01 NOTE — Discharge Instructions (Signed)

## 2011-06-01 NOTE — ED Provider Notes (Signed)
Medical screening examination/treatment/procedure(s) were performed by non-physician practitioner and as supervising physician I was immediately available for consultation/collaboration.  Olof Marcil P Trezure Cronk, MD 06/01/11 0400 

## 2011-06-01 NOTE — ED Provider Notes (Signed)
History     CSN: [id number]  Arrival date & time 06/01/11  0010   First MD Initiated Contact with Patient 06/01/11 0136      Chief Complaint  Patient presents with  . Hypertension    (Consider location/radiation/quality/duration/timing/severity/associated sxs/prior treatment) HPI Comments: Patient reports, posterior left neck discomfort, right ear pain, and hypertension.  She is checked, it several times at outpatient pharmacies with elevated readings.  She states she is taking her antihypertensives on a regular basis.  She has not called Dr.Avva, since she noticed this on Sunday  Patient is a 61 y.o. female presenting with hypertension. The history is provided by the patient.  Hypertension This is a new problem. The current episode started yesterday. The problem occurs constantly. Pertinent negatives include no abdominal pain, chills, congestion, fever, rash, sore throat, visual change or weakness. The symptoms are aggravated by exertion. She has tried nothing for the symptoms. The treatment provided no relief.    Past Medical History  Diagnosis Date  . Hypertension   . Arthritis     Past Surgical History  Procedure Date  . Abdominal surgery   . Knee surgery     No family history on file.  History  Substance Use Topics  . Smoking status: Former Games developer  . Smokeless tobacco: Not on file  . Alcohol Use: No    OB History    Grav Para Term Preterm Abortions TAB SAB Ect Mult Living                  Review of Systems  Constitutional: Negative for fever and chills.  HENT: Positive for ear pain. Negative for congestion and sore throat.   Eyes: Negative for visual disturbance.  Respiratory: Negative for shortness of breath.   Cardiovascular: Negative for palpitations and leg swelling.  Gastrointestinal: Negative for abdominal pain.  Genitourinary: Negative for dysuria.  Musculoskeletal: Negative for back pain.  Skin: Negative for rash.  Neurological: Negative for  dizziness and weakness.    Allergies  Penicillins  Home Medications   Current Outpatient Rx  Name Route Sig Dispense Refill  . ANTIPYRINE-BENZOCAINE 5.4-1.4 % OT SOLN Both Ears Place 3 drops into both ears every 2 (two) hours as needed. For ear pain    . CLONIDINE HCL 0.2 MG PO TABS Oral Take 0.2 mg by mouth 2 (two) times daily.     . IBUPROFEN 200 MG PO TABS Oral Take 400 mg by mouth every 6 (six) hours as needed. For pain or fever    . LABETALOL HCL 200 MG PO TABS Oral Take 200 mg by mouth 2 (two) times daily.      Carma Leaven M PLUS PO TABS Oral Take 1 tablet by mouth daily.        BP 159/72  Pulse 63  Temp(Src) 97.4 F (36.3 C) (Oral)  Resp 18  SpO2 99%  Physical Exam  Constitutional: She is oriented to person, place, and time. She appears well-developed and well-nourished.  HENT:  Head: Normocephalic.  Eyes: Pupils are equal, round, and reactive to light.  Neck: Normal range of motion.  Cardiovascular: Normal rate.   Pulmonary/Chest: Effort normal.  Musculoskeletal: Normal range of motion.  Neurological: She is alert and oriented to person, place, and time.  Skin: Skin is warm and dry.    ED Course  Procedures (including critical care time)   Labs Reviewed  POCT I-STAT, CHEM 8   Ct Head Wo Contrast  06/01/2011  *RADIOLOGY REPORT*  Clinical  Data: Hypertension, headache, left neck pain  CT HEAD WITHOUT CONTRAST  Technique:  Contiguous axial images were obtained from the base of the skull through the vertex without contrast.  Comparison: None  Findings: Normal ventricular morphology. No midline shift or mass effect. Incidental note of benign basal ganglia calcifications. Otherwise normal appearance of brain parenchyma. No intracranial hemorrhage, mass lesion or evidence of acute infarction. No extra-axial fluid collections. Visualized paranasal sinuses and mastoid air cells clear. No acute osseous findings.  IMPRESSION: No acute intracranial abnormalities.  Original Report  Authenticated By: Lollie Marrow, M.D.     1. Neck pain on left side   2. Hypertension     ED ECG REPORT   Date: 06/01/2011  EKG Time: 3:00 AM  Rate: 68  Rhythm: normal sinus rhythm,  unchanged from previous tracings  Axisnormal  Intervals:none  ST&T Change: LVH ST and T wave abnormal but same as last EKG  Narrative Interpretation: stable abnormal EKG            MDM  We'll check i-STAT, and EKG, and considering head CT to rule out stroke, although I think this is unlikely        Arman Filter, NP 06/01/11 0300  Arman Filter, NP 06/01/11 0301  Arman Filter, NP 06/01/11 0301

## 2011-07-29 ENCOUNTER — Encounter (HOSPITAL_COMMUNITY): Payer: Self-pay | Admitting: *Deleted

## 2011-07-29 ENCOUNTER — Emergency Department (HOSPITAL_COMMUNITY): Payer: BC Managed Care – PPO

## 2011-07-29 ENCOUNTER — Emergency Department (HOSPITAL_COMMUNITY)
Admission: EM | Admit: 2011-07-29 | Discharge: 2011-07-29 | Disposition: A | Discharge status: Home / Self Care | Payer: BC Managed Care – PPO | Attending: Emergency Medicine | Admitting: Emergency Medicine

## 2011-07-29 DIAGNOSIS — R059 Cough, unspecified: Secondary | ICD-10-CM | POA: Insufficient documentation

## 2011-07-29 DIAGNOSIS — B349 Viral infection, unspecified: Secondary | ICD-10-CM

## 2011-07-29 DIAGNOSIS — I10 Essential (primary) hypertension: Secondary | ICD-10-CM | POA: Insufficient documentation

## 2011-07-29 DIAGNOSIS — R05 Cough: Secondary | ICD-10-CM | POA: Insufficient documentation

## 2011-07-29 DIAGNOSIS — B9789 Other viral agents as the cause of diseases classified elsewhere: Secondary | ICD-10-CM | POA: Insufficient documentation

## 2011-07-29 LAB — POCT I-STAT, CHEM 8
BUN: 11 mg/dL (ref 6–23)
Chloride: 103 mEq/L (ref 96–112)
Glucose, Bld: 88 mg/dL (ref 70–99)
HCT: 40 % (ref 36.0–46.0)
Potassium: 4 mEq/L (ref 3.5–5.1)

## 2011-07-29 MED ORDER — HYDROCOD POLST-CHLORPHEN POLST 10-8 MG/5ML PO LQCR: 5.0000 mL | Freq: Two times a day (BID) | ORAL | Status: DC | PRN

## 2011-07-29 MED ORDER — ALBUTEROL SULFATE HFA 108 (90 BASE) MCG/ACT IN AERS
2.0000 | INHALATION_SPRAY | Freq: Once | RESPIRATORY_TRACT | Status: AC
  Administered 2011-07-29: 2 via RESPIRATORY_TRACT
  Filled 2011-07-29: qty 6.7

## 2011-07-29 NOTE — ED Provider Notes (Signed)
History     CSN: [id number]  Arrival date & time 07/29/11  0003   First MD Initiated Contact with Patient 07/29/11 0048     1:22 AM HPI Patient reports flulike symptoms began several days ago. States symptoms include cough, myalgias, stomach cramping, fatigue, sinus congestion. Reports cough is productive with yellow sputum. Denies fever, nausea, vomiting, diarrhea, neck pain, sore throat, and ear pain.  Patient is a 61 y.o. female presenting with flu symptoms. The history is provided by the patient.  Influenza This is a new problem. The current episode started in the past 7 days. The problem occurs constantly. The problem has been unchanged. Associated symptoms include chills, congestion and coughing. Pertinent negatives include no abdominal pain, chest pain, diaphoresis, fatigue, fever, headaches, myalgias, nausea, neck pain, numbness, sore throat, vomiting or weakness. The symptoms are aggravated by nothing. Treatments tried: sudafed. The treatment provided no relief.    Past Medical History  Diagnosis Date  . Hypertension   . Arthritis     Past Surgical History  Procedure Date  . Abdominal surgery   . Knee surgery     No family history on file.  History  Substance Use Topics  . Smoking status: Former Smoker    Quit date: 07/29/2010  . Smokeless tobacco: Not on file  . Alcohol Use: No    OB History    Grav Para Term Preterm Abortions TAB SAB Ect Mult Living                  Review of Systems  Constitutional: Positive for chills. Negative for fever, diaphoresis and fatigue.  HENT: Positive for congestion and rhinorrhea. Negative for sore throat, sneezing, trouble swallowing, neck pain, postnasal drip and sinus pressure.   Respiratory: Positive for cough. Negative for shortness of breath and wheezing.   Cardiovascular: Negative for chest pain and leg swelling.  Gastrointestinal: Negative for nausea, vomiting and abdominal pain.  Genitourinary: Negative for dysuria,  urgency and hematuria.  Musculoskeletal: Negative for myalgias.  Neurological: Negative for dizziness, weakness, numbness and headaches.  All other systems reviewed and are negative.    Allergies  Penicillins  Home Medications   Current Outpatient Rx  Name Route Sig Dispense Refill  . CLONIDINE HCL 0.2 MG PO TABS Oral Take 0.2 mg by mouth 2 (two) times daily.     . IBUPROFEN 200 MG PO TABS Oral Take 200-800 mg by mouth every 6 (six) hours as needed. For pain or fever    . LABETALOL HCL 200 MG PO TABS Oral Take 200 mg by mouth 2 (two) times daily.      . ADULT MULTIVITAMIN W/MINERALS CH Oral Take 1 tablet by mouth daily.      BP 189/87  Pulse 73  Temp(Src) 98.3 F (36.8 C) (Oral)  Resp 16  SpO2 100%  Physical Exam  Vitals reviewed. Constitutional: She is oriented to person, place, and time. Vital signs are normal. She appears well-developed and well-nourished.  HENT:  Head: Normocephalic and atraumatic.  Right Ear: External ear normal.  Left Ear: External ear normal.  Nose: Nose normal.  Mouth/Throat: Oropharynx is clear and moist. No oropharyngeal exudate.  Eyes: Conjunctivae are normal. Pupils are equal, round, and reactive to light.  Neck: Normal range of motion. Neck supple.  Cardiovascular: Normal rate, regular rhythm and normal heart sounds.  Exam reveals no friction rub.   No murmur heard. Pulmonary/Chest: Effort normal and breath sounds normal. She has no wheezes. She has no rhonchi.  She has no rales. She exhibits no tenderness.       Dry hacking cough   Abdominal: Soft. Bowel sounds are normal. She exhibits no distension and no mass. There is no tenderness. There is no rebound and no guarding.  Musculoskeletal: Normal range of motion.  Neurological: She is alert and oriented to person, place, and time. Coordination normal.  Skin: Skin is warm and dry. No rash noted. No erythema. No pallor.    ED Course  Procedures  Results for orders placed during the  hospital encounter of 07/29/11  POCT I-STAT, CHEM 8      Component Value Range   Sodium 142  135 - 145 (mEq/L)   Potassium 4.0  3.5 - 5.1 (mEq/L)   Chloride 103  96 - 112 (mEq/L)   BUN 11  6 - 23 (mg/dL)   Creatinine, Ser 1.61  0.50 - 1.10 (mg/dL)   Glucose, Bld 88  70 - 99 (mg/dL)   Calcium, Ion 0.96  0.45 - 1.32 (mmol/L)   TCO2 29  0 - 100 (mmol/L)   Hemoglobin 13.6  12.0 - 15.0 (g/dL)   HCT 40.9  81.1 - 91.4 (%)   Dg Chest 2 View  07/29/2011  *RADIOLOGY REPORT*  Clinical Data: Productive cough; history of smoking.  CHEST - 2 VIEW  Comparison: Chest radiograph performed 01/21/2011  Findings: The lungs are well-aerated.  Mild chronic peribronchial thickening is noted.  There is no evidence of focal opacification, pleural effusion or pneumothorax.  The heart is normal in size; calcification is seen within the aortic arch.  No acute osseous abnormalities are seen.  Mild right convex lumbar scoliosis is noted.  IMPRESSION:  1.  No acute cardiopulmonary process seen. 2.  Mild right convex lumbar scoliosis noted.  Original Report Authenticated By: Tonia Ghent, M.D.     MDM   Patient reports symptoms improved after inhaler. Will discharge home with diagnosis of viral infection. Advised return for worsening symptoms. Patient voices understanding and is ready for discharge      Thomasene Lot, Cordelia Poche 07/29/11 7829

## 2011-07-29 NOTE — ED Notes (Signed)
PA aware of BP and pt ok for d/c.  Pt taking BP meds when she gets home.

## 2011-07-29 NOTE — ED Notes (Signed)
Pt back from x-ray.  Pt reports feeling better after inhaler treatment.

## 2011-07-29 NOTE — ED Notes (Signed)
C/o cough and body aches & intermitant stomach cramping, (denies: fever, nvd, dizziness). States, "back feels tired & have sinus congestion". Productive cough yellow.

## 2011-07-29 NOTE — ED Notes (Signed)
Patient transported to X-ray 

## 2011-07-30 NOTE — ED Provider Notes (Signed)
Medical screening examination/treatment/procedure(s) were performed by non-physician practitioner and as supervising physician I was immediately available for consultation/collaboration.  Shelda Jakes, MD 07/30/11 2046

## 2011-10-09 ENCOUNTER — Other Ambulatory Visit: Payer: Self-pay | Admitting: Internal Medicine

## 2011-10-09 DIAGNOSIS — Z1231 Encounter for screening mammogram for malignant neoplasm of breast: Secondary | ICD-10-CM

## 2011-10-17 ENCOUNTER — Emergency Department (HOSPITAL_COMMUNITY)
Admission: EM | Admit: 2011-10-17 | Discharge: 2011-10-17 | Disposition: A | Discharge status: Home / Self Care | Payer: BC Managed Care – PPO | Attending: Emergency Medicine | Admitting: Emergency Medicine

## 2011-10-17 ENCOUNTER — Encounter (HOSPITAL_COMMUNITY): Payer: Self-pay | Admitting: Emergency Medicine

## 2011-10-17 ENCOUNTER — Emergency Department (HOSPITAL_COMMUNITY): Payer: BC Managed Care – PPO

## 2011-10-17 DIAGNOSIS — S6390XA Sprain of unspecified part of unspecified wrist and hand, initial encounter: Secondary | ICD-10-CM | POA: Insufficient documentation

## 2011-10-17 DIAGNOSIS — IMO0002 Reserved for concepts with insufficient information to code with codable children: Secondary | ICD-10-CM | POA: Insufficient documentation

## 2011-10-17 DIAGNOSIS — I10 Essential (primary) hypertension: Secondary | ICD-10-CM | POA: Insufficient documentation

## 2011-10-17 DIAGNOSIS — M25569 Pain in unspecified knee: Secondary | ICD-10-CM | POA: Insufficient documentation

## 2011-10-17 DIAGNOSIS — S63601A Unspecified sprain of right thumb, initial encounter: Secondary | ICD-10-CM

## 2011-10-17 MED ORDER — ACETAMINOPHEN 325 MG PO TABS
650.0000 mg | ORAL_TABLET | Freq: Once | ORAL | Status: AC
  Administered 2011-10-17: 650 mg via ORAL
  Filled 2011-10-17: qty 2

## 2011-10-17 MED ORDER — HYDROCODONE-ACETAMINOPHEN 5-325 MG PO TABS: 2.0000 | ORAL_TABLET | ORAL | Status: AC | PRN

## 2011-10-17 NOTE — ED Notes (Addendum)
Pt reports for 3-4 days pt has had swelling to R thumb and R knee; pt reports that slammed thumb in door, but does not know of injury to knee; pt CMS intact, was ambulatory at triage; pt does have swelling to R thumb and knee;  BP slightly high at triage- pt reports has not taken her second dose of BP meds yet

## 2011-10-17 NOTE — ED Notes (Signed)
Transported to [employer] via wheelchair

## 2011-10-17 NOTE — Progress Notes (Signed)
Orthopedic Tech Progress Note Patient Details:  [name] [birth year] [id number]  Ortho Devices Type of Ortho Device: Thumb spica splint Splint Material: Plaster Ortho Device/Splint Location: (R) UE Ortho Device/Splint Interventions: Application   Jennye Moccasin 10/17/2011, 7:22 PM

## 2011-10-17 NOTE — ED Notes (Signed)
Transported from [employer] via wheelchair per [employer]

## 2011-10-17 NOTE — ED Provider Notes (Addendum)
History  This chart was scribed for Doug Sou, MD by Ladona Ridgel Day. This patient was seen in room TR11C/TR11C and the patient's care was started at 1653.   CSN: [id number]  Arrival date & time 10/17/11  1653   None     Chief Complaint  Patient presents with  . Knee Pain  . Finger Injury    The history is provided by the patient. No language interpreter was used.   [name] is a 61 y.o. female who presents to the Emergency Department with chronic knee pain complaining of constant swelling/pain of her right knee suffers from right knee pain for one year worse with walking improved with rest. She states a previous knee surgery in 2000 and has always been giving her problems. She also states right thumb pain that has worsened after she jammed it on a door 2 weeks ago and now presents with swelling pain is worst at MCP joint worse with movement improved with remaining still. She has tried taking ibuprofen without any relief from her pain symptoms. She is a previous smoker, denies drinking alcohol or taking illegal drugs. She takes medicine for her HTN, denies any other injuries or illnesses at this time.    Past Medical History  Diagnosis Date  . Hypertension   . Arthritis     Past Surgical History  Procedure Date  . Knee surgery   . Abdominal surgery     for bowel blockage    History reviewed. No pertinent family history.  History  Substance Use Topics  . Smoking status: Former Smoker    Quit date: 07/29/2010  . Smokeless tobacco: Not on file  . Alcohol Use: No    OB History    Grav Para Term Preterm Abortions TAB SAB Ect Mult Living                  Review of Systems  Constitutional: Negative.  Negative for fever and chills.  HENT: Negative.  Negative for congestion, rhinorrhea, neck pain and neck stiffness.   Respiratory: Negative.  Negative for shortness of breath.   Cardiovascular: Negative.  Negative for chest pain.  Gastrointestinal: Negative.  Negative  for nausea, vomiting and abdominal pain.  Musculoskeletal: Positive for joint swelling ( right thumb/right knee swelling) and arthralgias. Negative for back pain.  Skin: Negative.  Negative for color change and pallor.  Neurological: Negative.  Negative for syncope.  Hematological: Negative.   Psychiatric/Behavioral: Negative.   All other systems reviewed and are negative.    Allergies  Penicillins  Home Medications   Current Outpatient Rx  Name Route Sig Dispense Refill  . CLONIDINE HCL 0.2 MG PO TABS Oral Take 0.2 mg by mouth 2 (two) times daily.     . IBUPROFEN 200 MG PO TABS Oral Take 200-800 mg by mouth every 6 (six) hours as needed. For pain    . LABETALOL HCL 200 MG PO TABS Oral Take 200 mg by mouth 2 (two) times daily.      . ADULT MULTIVITAMIN W/MINERALS CH Oral Take 1 tablet by mouth daily.    Marland Kitchen PRAVASTATIN SODIUM 40 MG PO TABS Oral Take 40 mg by mouth daily.      Triage Vitals: BP 186/67  Pulse 61  Temp 98.6 F (37 C) (Oral)  Resp 16  SpO2 97%  Physical Exam  Nursing note and vitals reviewed. Constitutional: She is oriented to person, place, and time. She appears well-developed and well-nourished.  HENT:  Head: Normocephalic and  atraumatic.  Eyes: Conjunctivae and EOM are normal.  Neck: Neck supple. No tracheal deviation present. No thyromegaly present.  Cardiovascular: Normal rate.   Pulmonary/Chest: Effort normal.  Abdominal: She exhibits no distension.  Musculoskeletal: Normal range of motion. She exhibits no edema and no tenderness.       Right upper extremity skin intact hand mildly swollen and tender at MCP joint of thumb. Pain with active motion. Right lower extremity tender anterior knee with mild swelling not red or warm  Neurological: She is alert and oriented to person, place, and time. Coordination normal.       Ambulatory upon exam and gait intact.   Skin: Skin is warm and dry. No rash noted.  Psychiatric: She has a normal mood and affect.    X-rays reviewed by me ED Course  Procedures (including critical care time) DIAGNOSTIC STUDIES: Oxygen Saturation is 97% on room air, adequate by my interpretation.    COORDINATION OF CARE: At 705 PM Discussed treatment plan with patient which includes splint for her thumb, and pain medicine. Patient agrees.   Labs Reviewed - No data to display Dg Knee Complete 4 Views Right  10/17/2011  *RADIOLOGY REPORT*  Clinical Data: Knee pain and swelling  RIGHT KNEE - COMPLETE 4+ VIEW  Comparison: 05/20/2011  Findings: Depression of the lateral tibial plateau is unchanged may be due to old injury.  There is spurring medially and laterally. There is a joint effusion.  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  Joint effusion.  Negative for acute fracture.  Original Report Authenticated By: Camelia Phenes, M.D.   Dg Finger Thumb Right  10/17/2011  *RADIOLOGY REPORT*  Clinical Data: Thumb injury 2 weeks ago.  Pain and swelling  RIGHT THUMB 2+V  Comparison: None.  Findings: Negative for fracture. There appears to be subluxation of the first metacarpal phalangeal joint which may indicate ligament injury or ligament laxity.  Mild degenerative change in the first metacarpal phalangeal joint.  IMPRESSION:  Negative for fracture.  Mild subluxation of the first MCP which may be due to acute or chronic injury.  Original Report Authenticated By: Camelia Phenes, M.D.     No diagnosis found.  X-rays reviewed by me  MDM  Right thumb pain due to subacute injury and sprain. Effusion right knee seen on x-ray likely chronic, strongly doubt septic arthritis with chronic knee pain. Most likely secondary to osteoarthritis Plan thumb spica splint Orthopedic referral Prescription hydrocodone-A. Pap Patient is encouraged to take her blood pressure medicine when she gets home she has not taken it today Diagnosis #1 sprain right thumb #2 chronic pain  right knee #3 hypertension  I personally performed the services described in this documentation, which was scribed in my presence. The recorded information has been reviewed and considered.      In the for right does  Doug Sou, MD 10/17/11 0981  Doug Sou, MD 10/17/11 1924

## 2011-11-03 ENCOUNTER — Ambulatory Visit: Payer: BC Managed Care – PPO

## 2011-11-23 ENCOUNTER — Ambulatory Visit
Admission: RE | Admit: 2011-11-23 | Discharge: 2011-11-23 | Disposition: A | Discharge status: Home / Self Care | Payer: BC Managed Care – PPO | Source: Ambulatory Visit | Attending: Internal Medicine | Admitting: Internal Medicine

## 2011-11-23 DIAGNOSIS — Z1231 Encounter for screening mammogram for malignant neoplasm of breast: Secondary | ICD-10-CM

## 2011-12-27 DIAGNOSIS — K5900 Constipation, unspecified: Secondary | ICD-10-CM | POA: Insufficient documentation

## 2011-12-27 DIAGNOSIS — I10 Essential (primary) hypertension: Secondary | ICD-10-CM | POA: Insufficient documentation

## 2011-12-27 DIAGNOSIS — R109 Unspecified abdominal pain: Secondary | ICD-10-CM | POA: Insufficient documentation

## 2011-12-27 DIAGNOSIS — M2550 Pain in unspecified joint: Secondary | ICD-10-CM | POA: Insufficient documentation

## 2011-12-27 NOTE — ED Notes (Signed)
Pt reports legs hurting, having headache, and mid abd pain since Sunday--reports she states that this symptoms are similar to when she had a bowel blockage; LBM 10/1 --used stool softener

## 2011-12-28 ENCOUNTER — Encounter (HOSPITAL_COMMUNITY): Payer: Self-pay | Admitting: Emergency Medicine

## 2011-12-28 ENCOUNTER — Emergency Department (HOSPITAL_COMMUNITY): Payer: BC Managed Care – PPO

## 2011-12-28 ENCOUNTER — Emergency Department (HOSPITAL_COMMUNITY)
Admission: EM | Admit: 2011-12-28 | Discharge: 2011-12-28 | Disposition: A | Discharge status: Home / Self Care | Payer: BC Managed Care – PPO | Attending: Emergency Medicine | Admitting: Emergency Medicine

## 2011-12-28 DIAGNOSIS — I10 Essential (primary) hypertension: Secondary | ICD-10-CM

## 2011-12-28 DIAGNOSIS — M2550 Pain in unspecified joint: Secondary | ICD-10-CM

## 2011-12-28 DIAGNOSIS — K5900 Constipation, unspecified: Secondary | ICD-10-CM

## 2011-12-28 LAB — CBC WITH DIFFERENTIAL/PLATELET
Eosinophils Relative: 3 % (ref 0–5)
HCT: 37.1 % (ref 36.0–46.0)
Lymphocytes Relative: 27 % (ref 12–46)
Lymphs Abs: 2.8 10*3/uL (ref 0.7–4.0)
MCV: 94.2 fL (ref 78.0–100.0)
Neutro Abs: 6.6 10*3/uL (ref 1.7–7.7)
Platelets: 222 10*3/uL (ref 150–400)
RBC: 3.94 MIL/uL (ref 3.87–5.11)
WBC: 10.2 10*3/uL (ref 4.0–10.5)

## 2011-12-28 LAB — URINALYSIS, ROUTINE W REFLEX MICROSCOPIC
Glucose, UA: NEGATIVE mg/dL
Hgb urine dipstick: NEGATIVE
Leukocytes, UA: NEGATIVE
Protein, ur: NEGATIVE mg/dL
Specific Gravity, Urine: 1.017 (ref 1.005–1.030)
pH: 6.5 (ref 5.0–8.0)

## 2011-12-28 LAB — BASIC METABOLIC PANEL
CO2: 26 mEq/L (ref 19–32)
Calcium: 9.4 mg/dL (ref 8.4–10.5)
Chloride: 107 mEq/L (ref 96–112)
Glucose, Bld: 88 mg/dL (ref 70–99)
Sodium: 143 mEq/L (ref 135–145)

## 2011-12-28 MED ORDER — IBUPROFEN 800 MG PO TABS
800.0000 mg | ORAL_TABLET | Freq: Once | ORAL | Status: AC
  Administered 2011-12-28: 800 mg via ORAL
  Filled 2011-12-28: qty 1

## 2011-12-28 MED ORDER — POLYETHYLENE GLYCOL 3350 17 G PO PACK: 17.0000 g | PACK | Freq: Every day | ORAL | Status: DC

## 2011-12-28 MED ORDER — LABETALOL HCL 200 MG PO TABS
200.0000 mg | ORAL_TABLET | Freq: Once | ORAL | Status: AC
  Administered 2011-12-28: 200 mg via ORAL
  Filled 2011-12-28: qty 1

## 2011-12-28 MED ORDER — LABETALOL HCL 5 MG/ML IV SOLN
20.0000 mg | Freq: Once | INTRAVENOUS | Status: AC
  Administered 2011-12-28: 20 mg via INTRAVENOUS
  Filled 2011-12-28: qty 4

## 2011-12-28 NOTE — ED Provider Notes (Signed)
History     CSN: [id number]  Arrival date & time 12/27/11  2349   First MD Initiated Contact with Patient 12/28/11 0116      Chief Complaint  Patient presents with  . Multiple Complaints     (Consider location/radiation/quality/duration/timing/severity/associated sxs/prior treatment) HPI 61 year old female presents to emergency department with several complaints. She reports tenderness in bilateral knees and the bottoms of her feet, and constipation. Patient is concerned that she may have a another bowel obstruction. She reports she had similar symptoms with leg pain when she had her prior bowel obstruction. Patient reports she had a bowel movement today, has been using over-the-counter stool softeners to assist with having BM. She's reports she stays hungry all the time no matter how much she eats, but has nausea. She denies any vomiting. She denies any chest pain or shortness of breath. She denies any abdominal distention or abdominal pain. No fevers no chills no sick contacts. She reports a mild dull headache. She reports Dr. Jacinto Halim., her cardiologist, recently changed her blood pressure medications. She is unsure of the strength and name of the new medications, but has been taking them as prescribed since Monday. Past Medical History  Diagnosis Date  . Hypertension   . Arthritis     Past Surgical History  Procedure Date  . Knee surgery   . Abdominal surgery     for bowel blockage  . Tubal ligation     History reviewed. No pertinent family history.  History  Substance Use Topics  . Smoking status: Former Smoker    Quit date: 07/29/2010  . Smokeless tobacco: Not on file  . Alcohol Use: No    OB History    Grav Para Term Preterm Abortions TAB SAB Ect Mult Living                  Review of Systems  All other systems reviewed and are negative.    Allergies  Penicillins  Home Medications   Current Outpatient Rx  Name Route Sig Dispense Refill  . IBUPROFEN 200  MG PO TABS Oral Take 200-800 mg by mouth every 6 (six) hours as needed. For pain    . LABETALOL HCL 200 MG PO TABS Oral Take 200 mg by mouth 2 (two) times daily.      . ADULT MULTIVITAMIN W/MINERALS CH Oral Take 1 tablet by mouth daily.    Marland Kitchen PRESCRIPTION MEDICATION Oral Take 1 tablet by mouth daily.    . CRESTOR PO Oral Take 1 tablet by mouth daily.      BP 196/92  Pulse 70  Temp 98.3 F (36.8 C) (Oral)  Resp 16  SpO2 99%  Physical Exam  Nursing note and vitals reviewed. Constitutional: She is oriented to person, place, and time. She appears well-developed and well-nourished. No distress.       Hypertension noted. Patient was unaware that her blood pressure was elevated.  HENT:  Head: Normocephalic and atraumatic.  Nose: Nose normal.  Mouth/Throat: Oropharynx is clear and moist.  Eyes: Conjunctivae normal and EOM are normal. Pupils are equal, round, and reactive to light.  Neck: Normal range of motion. Neck supple. No JVD present. No tracheal deviation present. No thyromegaly present.  Cardiovascular: Normal rate, regular rhythm, normal heart sounds and intact distal pulses.  Exam reveals no gallop and no friction rub.   No murmur heard. Pulmonary/Chest: Effort normal and breath sounds normal. No stridor. No respiratory distress. She has no wheezes. She has no rales.  She exhibits no tenderness.  Abdominal: Soft. Bowel sounds are normal. She exhibits no distension and no mass. There is no tenderness. There is no rebound and no guarding.  Musculoskeletal: Normal range of motion. She exhibits tenderness (mild tenderness with palpation of right knee, no crepitus no effusion no deformity noted). She exhibits no edema.  Lymphadenopathy:    She has no cervical adenopathy.  Neurological: She is alert and oriented to person, place, and time. No cranial nerve deficit. She exhibits normal muscle tone. Coordination normal.  Skin: Skin is warm and dry. No rash noted. No erythema. No pallor.    Psychiatric: She has a normal mood and affect. Her behavior is normal. Judgment and thought content normal.    ED Course  Procedures (including critical care time)   Labs Reviewed  CBC WITH DIFFERENTIAL  BASIC METABOLIC PANEL  URINALYSIS, ROUTINE W REFLEX MICROSCOPIC   Dg Abd Acute W/chest  12/28/2011  *RADIOLOGY REPORT*  Clinical Data: Mid abdominal pain  ACUTE ABDOMEN SERIES (ABDOMEN 2 VIEW & CHEST 1 VIEW)  Comparison: 01/08/2010 lumbar spine radiograph, 08/17/2009 CT abdomen pelvis  Findings: Lungs are clear.  Aortic atherosclerosis.  S-shaped scoliosis.  No free intraperitoneal air.  Nonobstructive bowel gas pattern.  Organ outlines normal where seen.  Pelvic calcification likely corresponds to a fibroid.  IMPRESSION: Nonobstructive bowel gas pattern.   Original Report Authenticated By: Waneta Martins, M.D.      1. Constipation   2. Hypertension   3. Arthralgia of multiple joints       MDM  61 year old female who has concerns for repeat bowel obstruction, but has no significant symptoms other than constipation which has improved with over-the-counter stool softeners. Patient is noted to be hypertensive. This may be contributing to her headache. I am unsure the significance of the pain in bilateral feet and knees, exam of these areas shows only some mild tenderness in the right knee which may be due to arthritis. Unable to verify patient's home medications with her pharmacy as they are closed. Will treat with labetalol as it appears patient is on this medication at home. We'll check baseline labs. Will refer back to her cardiologist for management of her blood pressure medications and her primary care Dr. if all workup here is negative.        Olivia Mackie, MD 12/28/11 [phone]

## 2012-03-07 ENCOUNTER — Encounter (HOSPITAL_COMMUNITY): Payer: Self-pay

## 2012-03-07 ENCOUNTER — Emergency Department (HOSPITAL_COMMUNITY)
Admission: EM | Admit: 2012-03-07 | Discharge: 2012-03-07 | Disposition: A | Discharge status: Home / Self Care | Payer: BC Managed Care – PPO | Attending: Emergency Medicine | Admitting: Emergency Medicine

## 2012-03-07 DIAGNOSIS — I10 Essential (primary) hypertension: Secondary | ICD-10-CM | POA: Insufficient documentation

## 2012-03-07 DIAGNOSIS — J019 Acute sinusitis, unspecified: Secondary | ICD-10-CM | POA: Insufficient documentation

## 2012-03-07 DIAGNOSIS — G8929 Other chronic pain: Secondary | ICD-10-CM | POA: Insufficient documentation

## 2012-03-07 DIAGNOSIS — Z791 Long term (current) use of non-steroidal anti-inflammatories (NSAID): Secondary | ICD-10-CM | POA: Insufficient documentation

## 2012-03-07 DIAGNOSIS — Z87891 Personal history of nicotine dependence: Secondary | ICD-10-CM | POA: Insufficient documentation

## 2012-03-07 DIAGNOSIS — Z79899 Other long term (current) drug therapy: Secondary | ICD-10-CM | POA: Insufficient documentation

## 2012-03-07 DIAGNOSIS — J329 Chronic sinusitis, unspecified: Secondary | ICD-10-CM

## 2012-03-07 DIAGNOSIS — M129 Arthropathy, unspecified: Secondary | ICD-10-CM | POA: Insufficient documentation

## 2012-03-07 DIAGNOSIS — K029 Dental caries, unspecified: Secondary | ICD-10-CM | POA: Insufficient documentation

## 2012-03-07 MED ORDER — ACETAMINOPHEN 325 MG PO TABS
975.0000 mg | ORAL_TABLET | Freq: Once | ORAL | Status: AC
  Administered 2012-03-07: 975 mg via ORAL
  Filled 2012-03-07: qty 3

## 2012-03-07 MED ORDER — AZITHROMYCIN 250 MG PO TABS
500.0000 mg | ORAL_TABLET | Freq: Once | ORAL | Status: AC
  Administered 2012-03-07: 500 mg via ORAL
  Filled 2012-03-07: qty 2

## 2012-03-07 MED ORDER — AZITHROMYCIN 250 MG PO TABS: 250.0000 mg | ORAL_TABLET | Freq: Every day | ORAL | Status: DC

## 2012-03-07 NOTE — ED Provider Notes (Signed)
History   This chart was scribed for Doug Sou, MD by Leone Payor, ED Scribe. This patient was seen in room TR06C/TR06C and the patient's care was started at 1125.   CSN: [id number]  Arrival date & time 03/07/12  1048   None     Chief Complaint  Patient presents with  . Cough     The history is provided by the patient. No language interpreter was used.    [name] is a 61 y.o. female who presents to the Emergency Department complaining of new, gradually worsening cough productive of yellow and white mucous starting 1 week ago. Pt states taking Sudafed with mild relief. She has associated chills, head congestion, ear pain, mild runny nose. Pt denies any fever. Pt also complains of intermittent stomach cramps of a couple months, unchanged for which she has been followed by by her primary care physician Dr.AVVA. She states she eats normally and has normal bowel movements, denies losing any weight.    PCP Dr. Virgel Manifold at [facility]  Pt has h/o HTN, arthritis, knee surgery, abdominal surgery, tubal ligation.  Pt is a former smoker but denies alcohol use. Past Medical History  Diagnosis Date  . Hypertension   . Arthritis     Past Surgical History  Procedure Date  . Knee surgery   . Abdominal surgery     for bowel blockage  . Tubal ligation     No family history on file.  History  Substance Use Topics  . Smoking status: Former Smoker    Quit date: 07/29/2010  . Smokeless tobacco: Not on file  . Alcohol Use: No    No OB history provided.   Review of Systems  Constitutional: Positive for chills. Negative for fever.  HENT: Positive for ear pain, congestion (head congestion) and rhinorrhea.        Dental pain  Respiratory: Positive for cough (productive of yellow and white mucous ).   Cardiovascular: Negative.   Gastrointestinal: Positive for abdominal pain.  Musculoskeletal: Negative.   Skin: Negative.   Neurological: Negative.   Hematological:  Negative.   Psychiatric/Behavioral: Negative.   All other systems reviewed and are negative.    Allergies  Penicillins  Home Medications   Current Outpatient Rx  Name  Route  Sig  Dispense  Refill  . IBUPROFEN 200 MG PO TABS   Oral   Take 200-800 mg by mouth every 6 (six) hours as needed. For pain         . LABETALOL HCL 200 MG PO TABS   Oral   Take 200 mg by mouth 2 (two) times daily.           . ADULT MULTIVITAMIN W/MINERALS CH   Oral   Take 1 tablet by mouth daily.           BP 147/57  Pulse 59  Temp 98.8 F (37.1 C) (Oral)  Resp 18  SpO2 100%  Physical Exam  Nursing note and vitals reviewed. Constitutional: She appears well-developed and well-nourished.  HENT:  Head: Normocephalic and atraumatic.       Poor dentition diffusely multiple dental caries bilateral tympanic membranes normal  Eyes: Conjunctivae normal are normal. Pupils are equal, round, and reactive to light.  Neck: Neck supple. No tracheal deviation present. No thyromegaly present.  Cardiovascular: Normal rate and regular rhythm.   No murmur heard. Pulmonary/Chest: Effort normal and breath sounds normal.  Abdominal: Soft. Bowel sounds are normal. She exhibits no distension. There is  no tenderness.  Musculoskeletal: Normal range of motion. She exhibits no edema and no tenderness.  Lymphadenopathy:    She has no cervical adenopathy.  Neurological: She is alert. Coordination normal.  Skin: Skin is warm and dry. No rash noted.  Psychiatric: She has a normal mood and affect.    ED Course  Procedures (including critical care time)  DIAGNOSTIC STUDIES: Oxygen Saturation is 100% on room air, normal by my interpretation.    COORDINATION OF CARE:     Labs Reviewed - No data to display No results found.   No diagnosis found.    MDM  Patient suffered from sinus pain. In light of poor dentition will treat with antibiotic, prescription Zithromax. Tylenol for pain. Dental referral. She  is encouraged to follow up with Dr. Laban Emperor regarding chronic abdominal pain.  Diagnosis #1 sinusitis #2 cough #3 chronic abdominal pain #4 multiple dental caries        Doug Sou, MD 03/07/12 2032

## 2012-03-07 NOTE — Discharge Instructions (Signed)
Dental Caries   Use saline nasal spray one squirt in each nostril every 2 hours while awake. Take Tylenol as directed for pain. Call Dr.Avva to further investigate your abdominal cramps Tooth decay (dental caries, cavities) is the most common of all oral diseases. It occurs in all ages but is more common in children and young adults.  CAUSES  Bacteria in your mouth combine with foods (particularly sugars and starches) to produce plaque. Plaque is a substance that sticks to the hard surfaces of teeth. The bacteria in the plaque produce acids that attack the enamel of teeth. Repeated acid attacks dissolve the enamel and create holes in the teeth. Root surfaces of teeth may also get these holes.  Other contributing factors include:   Frequent snacking and drinking of cavity-producing foods and liquids.  Poor oral hygiene.  Dry mouth.  Substance abuse such as methamphetamine.  Broken or poor fitting dental restorations.  Eating disorders.  Gastroesophageal reflux disease (GERD).  Certain radiation treatments to the head and neck. SYMPTOMS  At first, dental decay appears as white, chalky areas on the enamel. In this early stage, symptoms are seldom present. As the decay progresses, pits and holes may appear on the enamel surfaces. Progression of the decay will lead to softening of the hard layers of the tooth. At this point you may experience some pain or achy feeling after sweet, hot, or cold foods or drinks are consumed. If left untreated, the decay will reach the internal structures of the tooth and produce severe pain. Extensive dental treatment, such as root canal therapy, may be needed to save the tooth at this late stage of decay development.  DIAGNOSIS  Most cavities will be detected during regular check-ups. A thorough medical and dental history will be taken by the dentist. The dentist will use instruments to check the surfaces of your teeth for any breakdown or discoloration. Some  dentists have special instruments, such as lasers, that detect tooth decay. Dental X-rays may also show some cavities that are not visible to the eye (such as between the contact areas of the teeth). TREATMENT  Treatment involves removal of the tooth decay and replacement with a restorative material such as silver, gold, or composite (white) material. However, if the decay involves a large area of the tooth and there is little remaining healthy tooth structure, a cap (crown) will be fitted over the remaining structure. If the decay involves the center part of the tooth (pulp), root canal treatment will be needed before any type of dental restoration is placed. If the tooth is severely destroyed by the decay process, leaving the remaining tooth structures unrestorable, the tooth will need to be pulled (extracted). Some early tooth decay may be reversed by fluoride treatments and thorough brushing and flossing at home. PREVENTION   Eat healthy foods. Restrict the amount of sugary, starchy foods and liquids you consume. Avoid frequent snacking and drinking of unhealthy foods and liquids.  Sealants can help with prevention of cavities. Sealants are composite resins applied onto the biting surfaces of teeth at risk for decay. They smooth out the pits and grooves and prevent food from being trapped in them. This is done in early childhood before tooth decay has started.  Fluoride tablets may also be prescribed to children between 6 months and 2 years of age if your drinking water is not fluoridated. The fluoride absorbed by the tooth enamel makes teeth less susceptible to decay. Thorough daily cleaning with a toothbrush and dental  floss is the best way to prevent cavities. Use of a fluoride toothpaste is highly recommended. Fluoride mouth rinses may be used in specific cases.  Topical application of fluoride by your dentist is important in children.  Regular visits with a dentist for checkups and cleanings  are also important. SEEK IMMEDIATE DENTAL CARE IF:  You have a fever.  You develop redness and swelling of your face, jaw, or neck.  You develop swelling around a tooth.  You are unable to open your mouth or cannot swallow.  You have severe pain uncontrolled by pain medicine. Document Released: 12/03/2001 Document Revised: 06/05/2011 Document Reviewed: 08/18/2010 [facility] Patient Information 2013 [place], [place].

## 2012-03-07 NOTE — ED Notes (Addendum)
Pt sts she feels like she has an sinus infection. Has had a cough for a week, with yellow and white mucus. No fever. Does get cold chills occasionally

## 2012-05-06 ENCOUNTER — Other Ambulatory Visit: Payer: Self-pay | Admitting: Orthopedic Surgery

## 2012-05-14 ENCOUNTER — Encounter (HOSPITAL_BASED_OUTPATIENT_CLINIC_OR_DEPARTMENT_OTHER): Payer: Self-pay | Admitting: *Deleted

## 2012-05-14 NOTE — Progress Notes (Signed)
Pt may need to r/s if WC does not approve Will come for bmet if surg Friday-had ekg 3/13

## 2012-05-17 ENCOUNTER — Ambulatory Visit (HOSPITAL_BASED_OUTPATIENT_CLINIC_OR_DEPARTMENT_OTHER): Admit: 2012-05-17 | Payer: BC Managed Care – PPO | Admitting: Orthopedic Surgery

## 2012-05-17 HISTORY — DX: Cardiac murmur, unspecified: R01.1

## 2012-05-17 SURGERY — REPAIR, LIGAMENT, ULNAR COLLATERAL
Anesthesia: Choice | Site: Thumb | Laterality: Right

## 2012-05-29 ENCOUNTER — Other Ambulatory Visit: Payer: Self-pay | Admitting: Orthopedic Surgery

## 2012-05-30 NOTE — Progress Notes (Signed)
Spoke with patient to obtain pre-operative information. Patient stated she did not schedule the surgery for 06/05/12 and that date would not work for her. Is waiting on WC . Spoke with Dr. Merrilee Seashore office who thought surgery date had been scheduled by WC. They will contact patient and arrange to reschedule patient for a different date.

## 2012-06-05 ENCOUNTER — Ambulatory Visit (HOSPITAL_BASED_OUTPATIENT_CLINIC_OR_DEPARTMENT_OTHER): Admit: 2012-06-05 | Payer: BC Managed Care – PPO | Admitting: Orthopedic Surgery

## 2012-06-05 ENCOUNTER — Encounter (HOSPITAL_BASED_OUTPATIENT_CLINIC_OR_DEPARTMENT_OTHER): Payer: Self-pay

## 2012-06-05 SURGERY — REPAIR, LIGAMENT, ULNAR COLLATERAL
Anesthesia: Choice | Site: Thumb | Laterality: Right

## 2012-06-07 ENCOUNTER — Encounter (HOSPITAL_COMMUNITY): Payer: Self-pay | Admitting: *Deleted

## 2012-06-07 DIAGNOSIS — M25569 Pain in unspecified knee: Secondary | ICD-10-CM | POA: Insufficient documentation

## 2012-06-07 DIAGNOSIS — Z8719 Personal history of other diseases of the digestive system: Secondary | ICD-10-CM | POA: Insufficient documentation

## 2012-06-07 DIAGNOSIS — M25469 Effusion, unspecified knee: Secondary | ICD-10-CM | POA: Insufficient documentation

## 2012-06-07 DIAGNOSIS — Z87891 Personal history of nicotine dependence: Secondary | ICD-10-CM | POA: Insufficient documentation

## 2012-06-07 DIAGNOSIS — Z79899 Other long term (current) drug therapy: Secondary | ICD-10-CM | POA: Insufficient documentation

## 2012-06-07 DIAGNOSIS — R350 Frequency of micturition: Secondary | ICD-10-CM | POA: Insufficient documentation

## 2012-06-07 DIAGNOSIS — R011 Cardiac murmur, unspecified: Secondary | ICD-10-CM | POA: Insufficient documentation

## 2012-06-07 DIAGNOSIS — I10 Essential (primary) hypertension: Secondary | ICD-10-CM | POA: Insufficient documentation

## 2012-06-07 DIAGNOSIS — Z8739 Personal history of other diseases of the musculoskeletal system and connective tissue: Secondary | ICD-10-CM | POA: Insufficient documentation

## 2012-06-07 NOTE — ED Notes (Signed)
C/o bilateral leg pain & swelling, also bilateral upper groin/leg pain, and urinary frequency & general skin itching (denies: vaginal pain, fever, nvd), also mentions back feels tired.

## 2012-06-08 ENCOUNTER — Emergency Department (HOSPITAL_COMMUNITY)
Admission: EM | Admit: 2012-06-08 | Discharge: 2012-06-08 | Disposition: A | Discharge status: Home / Self Care | Payer: BC Managed Care – PPO | Attending: Emergency Medicine | Admitting: Emergency Medicine

## 2012-06-08 ENCOUNTER — Emergency Department (HOSPITAL_COMMUNITY): Payer: BC Managed Care – PPO

## 2012-06-08 DIAGNOSIS — M25561 Pain in right knee: Secondary | ICD-10-CM

## 2012-06-08 DIAGNOSIS — M25461 Effusion, right knee: Secondary | ICD-10-CM

## 2012-06-08 LAB — URINALYSIS, ROUTINE W REFLEX MICROSCOPIC
Bilirubin Urine: NEGATIVE
Glucose, UA: NEGATIVE mg/dL
Ketones, ur: NEGATIVE mg/dL
Leukocytes, UA: NEGATIVE
Protein, ur: NEGATIVE mg/dL

## 2012-06-08 NOTE — ED Notes (Signed)
Pt refused ace wrap said she has some at home

## 2012-06-08 NOTE — ED Notes (Signed)
Pt at d/c desk, wanting to speak with CN, pt "unhappy with and disagrees with dx".

## 2012-06-08 NOTE — ED Notes (Signed)
Refused ice to knee.

## 2012-06-08 NOTE — ED Provider Notes (Signed)
History     CSN: [id number]  Arrival date & time 06/07/12  2348   First MD Initiated Contact with Patient 06/08/12 [phone]      Chief Complaint  Patient presents with  . Leg Pain   HPI  History provided by the patient. Patient is a 62 year old female with history of hypertension and arthritis who presents with complaints of increasing right knee pain and swelling. Patient reports his symptoms have been increasing over the past few days. Pain is worse with some movements and walking. Patient has tried to rest the leg and use ibuprofen without significant improvements. Patient also complains of some urinary frequency and pressure that radiates some into bilateral thighs as if she was "having a urine infection". She denies dysuria or hematuria. Denies any flank pain. Denies any fever, chills or sweats. Denies any skin changes, erythema or increased warmth. Denies any other aggravating or alleviating factors. Denies any other associated symptoms.    Past Medical History  Diagnosis Date  . Hypertension   . Arthritis   . GI bleed     hx  . Heart murmur     Past Surgical History  Procedure Laterality Date  . Knee surgery    . Abdominal surgery      for bowel blockage  . Tubal ligation    . Colonoscopy  2010    History reviewed. No pertinent family history.  History  Substance Use Topics  . Smoking status: Former Smoker    Quit date: 07/29/2010  . Smokeless tobacco: Not on file  . Alcohol Use: No    OB History   Grav Para Term Preterm Abortions TAB SAB Ect Mult Living                  Review of Systems  Respiratory: Negative for shortness of breath.   Cardiovascular: Negative for chest pain.  All other systems reviewed and are negative.    Allergies  Penicillins  Home Medications   Current Outpatient Rx  Name  Route  Sig  Dispense  Refill  . diltiazem (DILACOR XR) 180 MG 24 hr capsule   Oral   Take 180 mg by mouth daily.         Marland Kitchen ibuprofen (ADVIL,MOTRIN)  200 MG tablet   Oral   Take 200-800 mg by mouth every 6 (six) hours as needed. For pain         . labetalol (NORMODYNE) 200 MG tablet   Oral   Take 200 mg by mouth 2 (two) times daily.           . Multiple Vitamin (MULITIVITAMIN WITH MINERALS) TABS   Oral   Take 1 tablet by mouth daily.         . pravastatin (PRAVACHOL) 40 MG tablet   Oral   Take 40 mg by mouth every evening.         . rosuvastatin (CRESTOR) 20 MG tablet   Oral   Take 20 mg by mouth daily.           BP 134/55  Pulse 54  Temp(Src) 97.8 F (36.6 C) (Oral)  Resp 18  SpO2 100%  Physical Exam  Nursing note and vitals reviewed. Constitutional: She is oriented to person, place, and time. She appears well-developed and well-nourished. No distress.  HENT:  Head: Normocephalic.  Cardiovascular: Normal rate and regular rhythm.   Pulmonary/Chest: Effort normal and breath sounds normal. No respiratory distress.  Abdominal: Soft.  Musculoskeletal: Normal range of  motion. She exhibits edema and tenderness.  Mild to moderate swelling around the right knee. No significant reduced range of motion. There is positive ballottement and signs of effusion. Negative anterior posterior drawer test. No increased laxity with valgus rare stress. Normal distal sensations and pulses in foot.  Neurological: She is alert and oriented to person, place, and time.  Skin: Skin is warm and dry. No rash noted.  Psychiatric: She has a normal mood and affect. Her behavior is normal.    ED Course  Procedures   Results for orders placed during the hospital encounter of 06/08/12  URINALYSIS, ROUTINE W REFLEX MICROSCOPIC      Result Value Range   Color, Urine YELLOW  YELLOW   APPearance CLEAR  CLEAR   Specific Gravity, Urine 1.020  1.005 - 1.030   pH 5.5  5.0 - 8.0   Glucose, UA NEGATIVE  NEGATIVE mg/dL   Hgb urine dipstick NEGATIVE  NEGATIVE   Bilirubin Urine NEGATIVE  NEGATIVE   Ketones, ur NEGATIVE  NEGATIVE mg/dL    Protein, ur NEGATIVE  NEGATIVE mg/dL   Urobilinogen, UA 0.2  0.0 - 1.0 mg/dL   Nitrite NEGATIVE  NEGATIVE   Leukocytes, UA NEGATIVE  NEGATIVE      Dg Knee Complete 4 Views Right  06/08/2012  *RADIOLOGY REPORT*  Clinical Data: Pain and limited range of motion in the right knee.  RIGHT KNEE - COMPLETE 4+ VIEW  Comparison: 10/17/2011 and 05/20/2011.  Findings: Again demonstrated, there is depression of the lateral tibial plateau with cortical irregularity and productive osteophytosis consistent with old fracture deformity and stable since previous study.  No acute fractures are appreciated.  Mild degenerative changes in the medial compartment. Small right knee effusion, larger than on the previous study.  This could indicate occult fracture, infection or inflammation, or response injury. Correlation with clinical examination is recommended.  Patella appears intact.  No focal bone lesion or bone destruction.  IMPRESSION: Old fracture deformity of the lateral tibial plateau is stable.  No acute fractures demonstrated.  Small effusion.   Original Report Authenticated By: Burman Nieves, M.D.      1. Knee pain, right   2. Knee effusion, right       MDM  Patient seen and evaluated. Patient in no acute distress. Patient has slight limp favoring right knee but otherwise normal gait. No specific injury or trauma.       Angus Seller, PA-C 06/08/12 [phone]

## 2012-06-08 NOTE — ED Provider Notes (Signed)
Medical screening examination/treatment/procedure(s) were performed by non-physician practitioner and as supervising physician I was immediately available for consultation/collaboration.   Hanley Seamen, MD 06/08/12 [phone]

## 2012-06-08 NOTE — ED Notes (Signed)
CN speaking with pt

## 2012-06-08 NOTE — ED Notes (Signed)
Refused ace wrap, ice and pain meds at this time. Would not sign for her D/C papers or listen to instructions. States she is tired and ready to go home.

## 2012-07-06 ENCOUNTER — Emergency Department (HOSPITAL_COMMUNITY)
Admission: EM | Admit: 2012-07-06 | Discharge: 2012-07-06 | Disposition: A | Discharge status: Home / Self Care | Payer: BC Managed Care – PPO | Attending: Emergency Medicine | Admitting: Emergency Medicine

## 2012-07-06 ENCOUNTER — Encounter (HOSPITAL_COMMUNITY): Payer: Self-pay | Admitting: Physical Medicine and Rehabilitation

## 2012-07-06 ENCOUNTER — Emergency Department (HOSPITAL_COMMUNITY): Payer: BC Managed Care – PPO

## 2012-07-06 DIAGNOSIS — Z87891 Personal history of nicotine dependence: Secondary | ICD-10-CM | POA: Insufficient documentation

## 2012-07-06 DIAGNOSIS — Z8739 Personal history of other diseases of the musculoskeletal system and connective tissue: Secondary | ICD-10-CM | POA: Insufficient documentation

## 2012-07-06 DIAGNOSIS — R109 Unspecified abdominal pain: Secondary | ICD-10-CM | POA: Insufficient documentation

## 2012-07-06 DIAGNOSIS — R011 Cardiac murmur, unspecified: Secondary | ICD-10-CM | POA: Insufficient documentation

## 2012-07-06 DIAGNOSIS — R1110 Vomiting, unspecified: Secondary | ICD-10-CM | POA: Insufficient documentation

## 2012-07-06 DIAGNOSIS — Z8719 Personal history of other diseases of the digestive system: Secondary | ICD-10-CM | POA: Insufficient documentation

## 2012-07-06 DIAGNOSIS — I10 Essential (primary) hypertension: Secondary | ICD-10-CM | POA: Insufficient documentation

## 2012-07-06 DIAGNOSIS — Z79899 Other long term (current) drug therapy: Secondary | ICD-10-CM | POA: Insufficient documentation

## 2012-07-06 DIAGNOSIS — R197 Diarrhea, unspecified: Secondary | ICD-10-CM | POA: Insufficient documentation

## 2012-07-06 LAB — URINALYSIS, ROUTINE W REFLEX MICROSCOPIC
Glucose, UA: NEGATIVE mg/dL
Specific Gravity, Urine: 1.013 (ref 1.005–1.030)
Urobilinogen, UA: 0.2 mg/dL (ref 0.0–1.0)

## 2012-07-06 LAB — CBC WITH DIFFERENTIAL/PLATELET
Basophils Absolute: 0 10*3/uL (ref 0.0–0.1)
Eosinophils Absolute: 0.2 10*3/uL (ref 0.0–0.7)
Eosinophils Relative: 3 % (ref 0–5)
MCH: 31.9 pg (ref 26.0–34.0)
MCV: 91.4 fL (ref 78.0–100.0)
Platelets: 273 10*3/uL (ref 150–400)
RDW: 13.4 % (ref 11.5–15.5)
WBC: 8.4 10*3/uL (ref 4.0–10.5)

## 2012-07-06 LAB — COMPREHENSIVE METABOLIC PANEL
AST: 25 U/L (ref 0–37)
Albumin: 3.8 g/dL (ref 3.5–5.2)
Calcium: 9.5 mg/dL (ref 8.4–10.5)
Creatinine, Ser: 0.64 mg/dL (ref 0.50–1.10)
Total Protein: 8 g/dL (ref 6.0–8.3)

## 2012-07-06 LAB — URINE MICROSCOPIC-ADD ON

## 2012-07-06 LAB — LIPASE, BLOOD: Lipase: 22 U/L (ref 11–59)

## 2012-07-06 MED ORDER — SODIUM CHLORIDE 0.9 % IV SOLN
1000.0000 mL | Freq: Once | INTRAVENOUS | Status: AC
  Administered 2012-07-06: 1000 mL via INTRAVENOUS

## 2012-07-06 MED ORDER — SODIUM CHLORIDE 0.9 % IV SOLN: 1000.0000 mL | INTRAVENOUS | Status: DC

## 2012-07-06 MED ORDER — ONDANSETRON 8 MG PO TBDP: 8.0000 mg | ORAL_TABLET | Freq: Three times a day (TID) | ORAL | Status: DC | PRN

## 2012-07-06 MED ORDER — ONDANSETRON HCL 4 MG/2ML IJ SOLN
4.0000 mg | Freq: Once | INTRAMUSCULAR | Status: AC
  Administered 2012-07-06: 4 mg via INTRAVENOUS
  Filled 2012-07-06: qty 2

## 2012-07-06 NOTE — ED Notes (Signed)
Pt aware of need of urine specimen; pt unable to urinate at this time

## 2012-07-06 NOTE — ED Provider Notes (Signed)
History    CSN: [id number] Arrival date & time 07/06/12  1134 First MD Initiated Contact with Patient 07/06/12 1206      Chief Complaint  Patient presents with  . Abdominal Pain  . Emesis  . Diarrhea    Patient is a 62 y.o. female presenting with abdominal pain, vomiting, and diarrhea. The history is provided by the patient.  Abdominal Pain Pain location:  RLQ and LLQ Pain quality: aching   Pain radiates to:  Does not radiate Pain severity:  Moderate Onset quality:  Gradual Duration:  1 day Timing:  Constant Context comment:  Pt noticed it after eating yesterday afternoon. Associated symptoms: diarrhea and vomiting   Emesis Associated symptoms: abdominal pain and diarrhea   Diarrhea Associated symptoms: abdominal pain and vomiting   She has vomited about 10 times and has had the same amount of diarrhea.  NO blood in her stool.  She has had prior abdominal surgery for a blockage.  Past Medical History  Diagnosis Date  . Hypertension   . Arthritis   . GI bleed     hx  . Heart murmur     Past Surgical History  Procedure Laterality Date  . Knee surgery    . Abdominal surgery      for bowel blockage  . Tubal ligation    . Colonoscopy  2010    No family history on file.  History  Substance Use Topics  . Smoking status: Former Smoker    Quit date: 07/29/2010  . Smokeless tobacco: Not on file  . Alcohol Use: No    OB History   Grav Para Term Preterm Abortions TAB SAB Ect Mult Living                  Review of Systems  Gastrointestinal: Positive for vomiting, abdominal pain and diarrhea.  All other systems reviewed and are negative.    Allergies  Penicillins  Home Medications   Current Outpatient Rx  Name  Route  Sig  Dispense  Refill  . diltiazem (DILACOR XR) 180 MG 24 hr capsule   Oral   Take 180 mg by mouth daily.         Marland Kitchen ibuprofen (ADVIL,MOTRIN) 200 MG tablet   Oral   Take 200-800 mg by mouth every 6 (six) hours as needed. For pain          . labetalol (NORMODYNE) 200 MG tablet   Oral   Take 200 mg by mouth 2 (two) times daily.           . Multiple Vitamin (MULITIVITAMIN WITH MINERALS) TABS   Oral   Take 1 tablet by mouth daily.         . rosuvastatin (CRESTOR) 20 MG tablet   Oral   Take 20 mg by mouth daily.         . ondansetron (ZOFRAN ODT) 8 MG disintegrating tablet   Oral   Take 1 tablet (8 mg total) by mouth every 8 (eight) hours as needed for nausea.   20 tablet   0     BP 142/81  Pulse 66  Temp(Src) 98.4 F (36.9 C) (Oral)  Resp 18  SpO2 100%  Physical Exam  Nursing note and vitals reviewed. Constitutional: She appears well-developed and well-nourished. No distress.  HENT:  Head: Normocephalic and atraumatic.  Right Ear: External ear normal.  Left Ear: External ear normal.  Eyes: Conjunctivae are normal. Right eye exhibits no discharge. Left eye exhibits  no discharge. No scleral icterus.  Neck: Neck supple. No tracheal deviation present.  Cardiovascular: Normal rate, regular rhythm and intact distal pulses.   Pulmonary/Chest: Effort normal and breath sounds normal. No stridor. No respiratory distress. She has no wheezes. She has no rales.  Abdominal: Soft. Bowel sounds are normal. She exhibits no distension. There is no tenderness. There is no rebound and no guarding.  Musculoskeletal: She exhibits no edema and no tenderness.  Neurological: She is alert. She has normal strength. No sensory deficit. Cranial nerve deficit:  no gross defecits noted. She exhibits normal muscle tone. She displays no seizure activity. Coordination normal.  Skin: Skin is warm and dry. No rash noted.  Psychiatric: She has a normal mood and affect.    ED Course  Procedures (including critical care time)  Labs Reviewed  COMPREHENSIVE METABOLIC PANEL - Abnormal; Notable for the following:    Glucose, Bld 102 (*)    All other components within normal limits  CBC WITH DIFFERENTIAL  LIPASE, BLOOD   URINALYSIS, ROUTINE W REFLEX MICROSCOPIC   Dg Abd Acute W/chest  07/06/2012  *RADIOLOGY REPORT*  Clinical Data: Abdominal pain.  Nausea and vomiting.  Diarrhea. Current history of hypertension and hypercholesterolemia.  ACUTE ABDOMEN SERIES (ABDOMEN 2 VIEW & CHEST 1 VIEW)  Comparison: Acute abdomen series 12/28/2011.  Findings: Bowel gas pattern unremarkable without evidence of obstruction or significant ileus.  No evidence of free air or significant air fluid levels on the erect image.  Expected stool burden in the colon.  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  Phleboliths low in the right side of the pelvis.  No visible opaque urinary tract calculi.  Thoracolumbar scoliosis convex right and severe degenerative changes throughout the lumbar spine.  Cardiac silhouette normal in size, unchanged.  Thoracic aorta atherosclerotic, unchanged.  Hilar and mediastinal contours otherwise unremarkable.  Lungs clear.  Bronchovascular markings normal.  Pulmonary vascularity normal.  No pneumothorax.  No pleural effusions.  IMPRESSION: No acute abdominal or pulmonary abnormalities.   Original Report Authenticated By: Hulan Saas, M.D.      1. Vomiting and diarrhea       MDM  Obstruction. While she was in the emergency department she has been able to eat and drink. She is feeling better after nausea medications and fluids.  Suspect viral GE.  No obstruction noted.  Will dc home.        Celene Kras, MD 07/06/12 [phone]

## 2012-07-06 NOTE — ED Notes (Signed)
Pt undressed, in gown, on continuous pulse oximetry and blood pressure cuff 

## 2012-07-06 NOTE — ED Notes (Signed)
Pt up ambulatory to the bathroom at this time; pt will attempt to provide an urine specimen as well

## 2012-07-06 NOTE — ED Notes (Signed)
Pt presents to department for evaluation of N/V/D and lower abdominal pain. Ongoing x3 week. No relief of symptoms. 8/10 pain at the time. Denies urinary problems. Denies vaginal bleeding/discharge. Pt is conscious alert and oriented x4.

## 2012-07-06 NOTE — ED Notes (Signed)
Pt has returned from being out of the department; placed back on continuous pulse oximetry and blood pressure cuff 

## 2012-10-06 ENCOUNTER — Emergency Department (HOSPITAL_COMMUNITY)
Admission: EM | Admit: 2012-10-06 | Discharge: 2012-10-06 | Disposition: A | Discharge status: Home / Self Care | Payer: BC Managed Care – PPO | Attending: Emergency Medicine | Admitting: Emergency Medicine

## 2012-10-06 ENCOUNTER — Encounter (HOSPITAL_COMMUNITY): Payer: Self-pay | Admitting: *Deleted

## 2012-10-06 ENCOUNTER — Emergency Department (HOSPITAL_COMMUNITY): Payer: BC Managed Care – PPO

## 2012-10-06 DIAGNOSIS — M545 Low back pain, unspecified: Secondary | ICD-10-CM | POA: Insufficient documentation

## 2012-10-06 DIAGNOSIS — Z9889 Other specified postprocedural states: Secondary | ICD-10-CM | POA: Insufficient documentation

## 2012-10-06 DIAGNOSIS — M5136 Other intervertebral disc degeneration, lumbar region: Secondary | ICD-10-CM

## 2012-10-06 DIAGNOSIS — M1710 Unilateral primary osteoarthritis, unspecified knee: Secondary | ICD-10-CM | POA: Insufficient documentation

## 2012-10-06 DIAGNOSIS — Z880 Allergy status to penicillin: Secondary | ICD-10-CM | POA: Insufficient documentation

## 2012-10-06 DIAGNOSIS — M4180 Other forms of scoliosis, site unspecified: Secondary | ICD-10-CM

## 2012-10-06 DIAGNOSIS — R011 Cardiac murmur, unspecified: Secondary | ICD-10-CM | POA: Insufficient documentation

## 2012-10-06 DIAGNOSIS — M4120 Other idiopathic scoliosis, site unspecified: Secondary | ICD-10-CM | POA: Insufficient documentation

## 2012-10-06 DIAGNOSIS — M51379 Other intervertebral disc degeneration, lumbosacral region without mention of lumbar back pain or lower extremity pain: Secondary | ICD-10-CM | POA: Insufficient documentation

## 2012-10-06 DIAGNOSIS — M1711 Unilateral primary osteoarthritis, right knee: Secondary | ICD-10-CM

## 2012-10-06 DIAGNOSIS — Z79899 Other long term (current) drug therapy: Secondary | ICD-10-CM | POA: Insufficient documentation

## 2012-10-06 DIAGNOSIS — Z8719 Personal history of other diseases of the digestive system: Secondary | ICD-10-CM | POA: Insufficient documentation

## 2012-10-06 DIAGNOSIS — I10 Essential (primary) hypertension: Secondary | ICD-10-CM | POA: Insufficient documentation

## 2012-10-06 DIAGNOSIS — M5137 Other intervertebral disc degeneration, lumbosacral region: Secondary | ICD-10-CM | POA: Insufficient documentation

## 2012-10-06 DIAGNOSIS — IMO0002 Reserved for concepts with insufficient information to code with codable children: Secondary | ICD-10-CM | POA: Insufficient documentation

## 2012-10-06 DIAGNOSIS — Z87891 Personal history of nicotine dependence: Secondary | ICD-10-CM | POA: Insufficient documentation

## 2012-10-06 LAB — URINALYSIS, ROUTINE W REFLEX MICROSCOPIC
Leukocytes, UA: NEGATIVE
Nitrite: NEGATIVE
Protein, ur: NEGATIVE mg/dL
Specific Gravity, Urine: 1.019 (ref 1.005–1.030)
Urobilinogen, UA: 0.2 mg/dL (ref 0.0–1.0)

## 2012-10-06 LAB — CBC WITH DIFFERENTIAL/PLATELET
Basophils Absolute: 0.1 10*3/uL (ref 0.0–0.1)
Basophils Relative: 1 % (ref 0–1)
Eosinophils Absolute: 0.3 10*3/uL (ref 0.0–0.7)
HCT: 36.1 % (ref 36.0–46.0)
Hemoglobin: 12.3 g/dL (ref 12.0–15.0)
Lymphocytes Relative: 25 % (ref 12–46)
Lymphs Abs: 1.9 10*3/uL (ref 0.7–4.0)
MCV: 93.3 fL (ref 78.0–100.0)
RBC: 3.87 MIL/uL (ref 3.87–5.11)
RDW: 13.7 % (ref 11.5–15.5)
WBC: 7.7 10*3/uL (ref 4.0–10.5)

## 2012-10-06 LAB — COMPREHENSIVE METABOLIC PANEL
Albumin: 3.8 g/dL (ref 3.5–5.2)
Alkaline Phosphatase: 86 U/L (ref 39–117)
BUN: 16 mg/dL (ref 6–23)
Potassium: 3.9 mEq/L (ref 3.5–5.1)
Sodium: 138 mEq/L (ref 135–145)
Total Protein: 7 g/dL (ref 6.0–8.3)

## 2012-10-06 MED ORDER — IBUPROFEN 800 MG PO TABS
800.0000 mg | ORAL_TABLET | Freq: Once | ORAL | Status: AC
  Administered 2012-10-06: 800 mg via ORAL
  Filled 2012-10-06: qty 1

## 2012-10-06 MED ORDER — MELOXICAM 15 MG PO TABS: 15.0000 mg | ORAL_TABLET | Freq: Every day | ORAL | Status: DC

## 2012-10-06 NOTE — ED Notes (Addendum)
Pt is here with bilateral knee pain and right lower lateral abdominal pain (3 days).  No nvd.  Pt is complaining of lower back pain with walking

## 2012-10-06 NOTE — ED Provider Notes (Signed)
History    CSN: [id number] Arrival date & time 10/06/12  1020  First MD Initiated Contact with Patient 10/06/12 1117     No chief complaint on file.  (Consider location/radiation/quality/duration/timing/severity/associated sxs/prior Treatment) HPI  [name] is a(n) 62 y.o. female who presents  With chief complaint of bilateral knee and lower back pain.  Patient states that she has had approximately one week of right-sided lower back pain.  She has noticed some swelling.  She states that the pain is constant however she has mild relief with 400 mg ibuprofen twice a day.  Pain is worse when getting up from a sitting position.  She has some relief with ambulation and that lying flat.  Denies weakness, loss of bowel/bladder function or saddle anesthesia. Denies neck stiffness, headache, rash.  Denies fever or recent procedures to back.  Patient denies any urinary symptoms.  She has no symptoms of ureteral colic or urinary tract infection.  She denies any abdominal pain, nausea, vomiting, diarrhea, hematochezia or melena.  Patient denies constipation. The patient also complains of bilateral knee pain.  She states that her knees always have pain she has a history of osteoarthritis and previous arthroscopy of the right knee.  She states that she has noticed swelling and increased pain over a week.  She has only been taking ibuprofen.  She denies any injury to her back or knee. Denies fevers, chills, myalgias, arthralgias. Denies DOE, SOB, chest tightness or pressure, radiation to left arm, jaw or back, or diaphoresis.Denies headaches, light headedness, weakness, visual disturbances.     Past Medical History  Diagnosis Date  . Hypertension   . Arthritis   . GI bleed     hx  . Heart murmur    Past Surgical History  Procedure Laterality Date  . Knee surgery    . Abdominal surgery      for bowel blockage  . Tubal ligation    . Colonoscopy  2010   No family history on file. History   Substance Use Topics  . Smoking status: Former Smoker    Quit date: 07/29/2010  . Smokeless tobacco: Not on file  . Alcohol Use: No   OB History   Grav Para Term Preterm Abortions TAB SAB Ect Mult Living                 Review of Systems Ten systems reviewed and are negative for acute change, except as noted in the HPI.   Allergies  Penicillins  Home Medications   Current Outpatient Rx  Name  Route  Sig  Dispense  Refill  . diltiazem (DILACOR XR) 180 MG 24 hr capsule   Oral   Take 180 mg by mouth daily.         Marland Kitchen ibuprofen (ADVIL,MOTRIN) 200 MG tablet   Oral   Take 200-800 mg by mouth every 6 (six) hours as needed. For pain         . labetalol (NORMODYNE) 200 MG tablet   Oral   Take 200 mg by mouth 2 (two) times daily.           . Multiple Vitamin (MULITIVITAMIN WITH MINERALS) TABS   Oral   Take 1 tablet by mouth daily.         . rosuvastatin (CRESTOR) 20 MG tablet   Oral   Take 20 mg by mouth daily.          BP 153/71  Pulse 60  Temp(Src) 98.2 F (36.8  C) (Oral)  Resp 18  SpO2 100% Physical Exam Physical Exam  Nursing note and vitals reviewed. Constitutional: She is oriented to person, place, and time. She appears well-developed and well-nourished. No distress.  HENT:  Head: Normocephalic and atraumatic.  Eyes: Conjunctivae normal and EOM are normal. Pupils are equal, round, and reactive to light. No scleral icterus.  Neck: Normal range of motion.  Cardiovascular: Normal rate, regular rhythm and normal heart sounds.  Exam reveals no gallop and no friction rub.   No murmur heard. Pulmonary/Chest: Effort normal and breath sounds normal. No respiratory distress.  Abdominal: Soft. Bowel sounds are normal. She exhibits no distension and no mass. There is no tenderness. There is no guarding.  no CVA tenderness  Musculoskeletal:   Back: No midline tenderness.  Patient appears to have dextro scoliosis of the lumbar spine and lower thoracic spine  making the appearance of the right side different from the left.  She is no tenderness to palpation in the muscles.  Range of motion is not limited due to her pain.  Knee: Patient was range of motion bilateral knees.  No crepitus.  She is tender to palpation along the medial joint line bilaterally.  Patient has positive effusion and ballottement test in the right knee.  Multiple well-healed scars from previous arthroscopy.  Heat redness.  Gait is antalgic.   Neurological: She is alert and oriented to person, place, and time.  normal deep tendon reflexes and strength. Skin: Skin is warm and dry. She is not diaphoretic.    ED Course  Procedures (including critical care time) Labs Reviewed  COMPREHENSIVE METABOLIC PANEL - Abnormal; Notable for the following:    GFR calc non Af Amer 75 (*)    GFR calc Af Amer 86 (*)    All other components within normal limits  URINALYSIS, ROUTINE W REFLEX MICROSCOPIC - Abnormal; Notable for the following:    Color, Urine AMBER (*)    All other components within normal limits  CBC WITH DIFFERENTIAL   Dg Lumbar Spine Complete  10/06/2012   *RADIOLOGY REPORT*  Clinical Data: Low back pain.  No known injury.  LUMBAR SPINE - COMPLETE 4+ VIEW  Comparison: None.  Findings: No evidence of acute fracture, spondylolysis, or spondylolisthesis.  Moderate to severe degenerative disc disease is seen at all lumbar levels.  There is moderate rotatory dextroscoliosis.  Lower lumbar facet DJD seen mainly at levels of L4-5 and L5-S1.  IMPRESSION:  1.  No acute findings. 2.  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   Original Report Authenticated By: Myles Rosenthal, M.D.   Dg Knee Complete 4 Views Left  10/06/2012   *RADIOLOGY REPORT*  Clinical Data: Bilateral knee pain.  LEFT KNEE - COMPLETE 4+ VIEW  Comparison: Plain films left knee 02/23/2011.  Findings: Joint spaces are preserved.  There is no fracture or dislocation.  No joint effusion is identified.   IMPRESSION: Negative exam.   Original Report Authenticated By: Holley Dexter, M.D.   Dg Knee Complete 4 Views Right  10/06/2012   *RADIOLOGY REPORT*  Clinical Data: Bilateral knee pain.  RIGHT KNEE - COMPLETE 4+ VIEW  Comparison: Plain films right knee 06/08/2012.  Findings: There is no acute bony or joint abnormality. 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  No joint effusion is noted.  IMPRESSION: No acute finding.  Tricompartmental osteoarthritis.   Original Report Authenticated By: Holley Dexter, M.D.   1. DDD (degenerative disc disease), lumbar   2. Dextroscoliosis   3.  Osteoarthritis of right knee     MDM  11:55 AM Filed Vitals:   10/06/12 1027  BP: 153/71  Pulse: 60  Temp: 98.2 F (36.8 C)  Resp: 18   Patient's symptoms appear to be musculoskeletal in origin.  Am obtaining imaging.  Labs are unremarkable and urine is negative.  I feel the patient is under treating her pain with only 400 mg of ibuprofen twice a day.   12:56 PM Paitent with OA of the Right knee, Severe Lumbar DDD with rotary dextroscoliosis. Patient will be discharged with NSAIDS and follow up with her orthopedist.     Arthor Captain, PA-C 10/06/12 [phone]

## 2012-10-07 ENCOUNTER — Other Ambulatory Visit: Payer: Self-pay

## 2012-10-07 DIAGNOSIS — Z1231 Encounter for screening mammogram for malignant neoplasm of breast: Secondary | ICD-10-CM

## 2012-10-09 NOTE — ED Provider Notes (Signed)
Medical screening examination/treatment/procedure(s) were performed by non-physician practitioner and as supervising physician I was immediately available for consultation/collaboration. Devoria Albe, MD, Armando Gang   Ward Givens, MD 10/09/12 [phone]

## 2012-10-23 ENCOUNTER — Emergency Department (INDEPENDENT_AMBULATORY_CARE_PROVIDER_SITE_OTHER)
Admission: EM | Admit: 2012-10-23 | Discharge: 2012-10-23 | Disposition: A | Discharge status: Home / Self Care | Payer: Worker's Compensation | Source: Home / Self Care

## 2012-10-23 ENCOUNTER — Encounter (HOSPITAL_COMMUNITY): Payer: Self-pay | Admitting: *Deleted

## 2012-10-23 DIAGNOSIS — T6391XA Toxic effect of contact with unspecified venomous animal, accidental (unintentional), initial encounter: Secondary | ICD-10-CM

## 2012-10-23 DIAGNOSIS — T63461A Toxic effect of venom of wasps, accidental (unintentional), initial encounter: Secondary | ICD-10-CM

## 2012-10-23 MED ORDER — TRIAMCINOLONE ACETONIDE 0.025 % EX OINT: TOPICAL_OINTMENT | Freq: Two times a day (BID) | CUTANEOUS | Status: DC

## 2012-10-23 NOTE — ED Notes (Signed)
C/o bee sting to R upper forehead.  Still having pain.  Minimal swelling noted.

## 2012-10-23 NOTE — ED Provider Notes (Signed)
[name] is a 62 y.o. female who presents to Urgent Care today for bee sting upper right forehead. Patient was stung by a bee last night while at work. She continues to have some pain at the site of the bee sting on her. Forehead and is here today for evaluation and management. She tried taking some ibuprofen which helped a bit. She denies any swelling itching or trouble breathing or swallowing. She feels well otherwise and denies any allergies to bee stings. The pain as moderate and not worse with anything.    PMH reviewed. Hypertension. Allergic to penicillins History  Substance Use Topics  . Smoking status: Former Smoker    Quit date: 07/29/2010  . Smokeless tobacco: Not on file  . Alcohol Use: No   ROS as above Medications reviewed. No current facility-administered medications for this encounter.   Current Outpatient Prescriptions  Medication Sig Dispense Refill  . diltiazem (DILACOR XR) 180 MG 24 hr capsule Take 180 mg by mouth daily.      Marland Kitchen labetalol (NORMODYNE) 200 MG tablet Take 200 mg by mouth 2 (two) times daily.        . Multiple Vitamin (MULITIVITAMIN WITH MINERALS) TABS Take 1 tablet by mouth daily.      Marland Kitchen OVER THE COUNTER MEDICATION Apply 1 application topically 2 (two) times daily as needed. Anti-Itch cream      . rosuvastatin (CRESTOR) 20 MG tablet Take 20 mg by mouth daily.      Marland Kitchen triamcinolone (KENALOG) 0.025 % ointment Apply topically 2 (two) times daily.  15 g  0    Exam:  BP 162/51  Pulse 54  Temp(Src) 98.2 F (36.8 C) (Oral)  Resp 16  SpO2 100% Gen: Well NAD HEENT: EOMI,  MMM Lungs: CTABL Nl WOB Heart: RRR no MRG Abd: NABS, NT, ND Exts: Non edematous BL  LE, warm and well perfused.  Skin: Tiny papule at the site of the bee sting in the upper right forehead without any surrounding erythema or induration or tenderness.   No results found for this or any previous visit (from the past 24 hour(s)). No results found.  Assessment and Plan: 62 y.o.  female with bee sting right upper forehead. Reassured patient. Prescribe small amount of triamcinolone cream as needed. Back to work Monday. Discussed warning signs or symptoms. Please see discharge instructions. Patient expresses understanding.      Rodolph Bong, MD 10/23/12 [phone]

## 2012-11-24 ENCOUNTER — Emergency Department (HOSPITAL_COMMUNITY)
Admission: EM | Admit: 2012-11-24 | Discharge: 2012-11-24 | Disposition: A | Discharge status: Home / Self Care | Payer: BC Managed Care – PPO | Attending: Emergency Medicine | Admitting: Emergency Medicine

## 2012-11-24 ENCOUNTER — Emergency Department (HOSPITAL_COMMUNITY): Payer: BC Managed Care – PPO

## 2012-11-24 ENCOUNTER — Encounter (HOSPITAL_COMMUNITY): Payer: Self-pay

## 2012-11-24 DIAGNOSIS — Z79899 Other long term (current) drug therapy: Secondary | ICD-10-CM | POA: Insufficient documentation

## 2012-11-24 DIAGNOSIS — Z9889 Other specified postprocedural states: Secondary | ICD-10-CM | POA: Insufficient documentation

## 2012-11-24 DIAGNOSIS — Z880 Allergy status to penicillin: Secondary | ICD-10-CM | POA: Insufficient documentation

## 2012-11-24 DIAGNOSIS — M549 Dorsalgia, unspecified: Secondary | ICD-10-CM

## 2012-11-24 DIAGNOSIS — I10 Essential (primary) hypertension: Secondary | ICD-10-CM | POA: Insufficient documentation

## 2012-11-24 DIAGNOSIS — M1710 Unilateral primary osteoarthritis, unspecified knee: Secondary | ICD-10-CM | POA: Insufficient documentation

## 2012-11-24 DIAGNOSIS — G8929 Other chronic pain: Secondary | ICD-10-CM | POA: Insufficient documentation

## 2012-11-24 DIAGNOSIS — M545 Low back pain, unspecified: Secondary | ICD-10-CM | POA: Insufficient documentation

## 2012-11-24 DIAGNOSIS — R110 Nausea: Secondary | ICD-10-CM | POA: Insufficient documentation

## 2012-11-24 DIAGNOSIS — Z87891 Personal history of nicotine dependence: Secondary | ICD-10-CM | POA: Insufficient documentation

## 2012-11-24 DIAGNOSIS — R011 Cardiac murmur, unspecified: Secondary | ICD-10-CM | POA: Insufficient documentation

## 2012-11-24 DIAGNOSIS — Z8719 Personal history of other diseases of the digestive system: Secondary | ICD-10-CM | POA: Insufficient documentation

## 2012-11-24 MED ORDER — KETOROLAC TROMETHAMINE 30 MG/ML IJ SOLN
30.0000 mg | Freq: Once | INTRAMUSCULAR | Status: AC
  Administered 2012-11-24: 30 mg via INTRAMUSCULAR
  Filled 2012-11-24: qty 1

## 2012-11-24 MED ORDER — HYDROCODONE-ACETAMINOPHEN 5-325 MG PO TABS
1.0000 | ORAL_TABLET | Freq: Once | ORAL | Status: DC
  Filled 2012-11-24: qty 1

## 2012-11-24 MED ORDER — HYDROCODONE-ACETAMINOPHEN 5-325 MG PO TABS: 1.0000 | ORAL_TABLET | Freq: Four times a day (QID) | ORAL | Status: DC | PRN

## 2012-11-24 NOTE — ED Notes (Signed)
Dr. Horton at the bedside.  

## 2012-11-24 NOTE — ED Notes (Signed)
MD spoke at length with patient about plan of care and discharge. She recommended OTC Motrin for inflammation/pain at home. Pt refused plan of care. Prescription written.

## 2012-11-24 NOTE — ED Provider Notes (Addendum)
CSN: [id number]     Arrival date & time 11/24/12  0820 History   First MD Initiated Contact with Patient 11/24/12 [phone]     Chief Complaint  Patient presents with  . Back Pain  . Knee Pain   (Consider location/radiation/quality/duration/timing/severity/associated sxs/prior Treatment) HPI This is a 62 year old female with a history of hypertension, arthritis who presents with back pain and knee pain. She was seen in early July for the same. At that time she was noted to have arthritis of the knee.  Patient reports that she has had the same pain since discharge from the hospital. She's been taking Tylenol but it has not been helping. She states her pain is getting worse. She states that she now has increasing pain in her left knee more than her right knee. She has been ambulatory. She denies any injury. Patient also reports low back pain. It is left-sided. She denies any weakness, numbness, or tingling in her bilateral lower extremities. She denies any urinary retention or bowel incontinence.  Patient further denies any fever, headache, chest pain, shortness of breath , abdominal pain coming urinary symptoms, focal weakness or numbness. She does endorse intermittent nausea which is not new. Past Medical History  Diagnosis Date  . Hypertension   . Arthritis   . GI bleed     hx  . Heart murmur    Past Surgical History  Procedure Laterality Date  . Knee surgery Right 1999  . Tubal ligation  1979  . Abdominal surgery  2010    for bowel blockage   Family History  Problem Relation Age of Onset  . Hypertension Mother   . Heart failure Father   . Hypertension Father    History  Substance Use Topics  . Smoking status: Former Smoker    Quit date: 07/29/2010  . Smokeless tobacco: Not on file  . Alcohol Use: No   OB History   Grav Para Term Preterm Abortions TAB SAB Ect Mult Living                 Review of Systems  Constitutional: Negative for fever.  HENT: Negative for neck  stiffness.   Respiratory: Negative for cough, chest tightness and shortness of breath.   Cardiovascular: Negative for chest pain.  Gastrointestinal: Positive for nausea. Negative for vomiting and abdominal pain.  Genitourinary: Negative for dysuria.  Musculoskeletal: Positive for back pain, joint swelling and arthralgias. Negative for gait problem.  Skin: Negative for wound.  Neurological: Negative for weakness and numbness.  Psychiatric/Behavioral: The patient is not nervous/anxious.   All other systems reviewed and are negative.    Allergies  Penicillins  Home Medications   Current Outpatient Rx  Name  Route  Sig  Dispense  Refill  . acetaminophen (TYLENOL) 500 MG tablet   Oral   Take 1,000 mg by mouth every 6 (six) hours as needed for pain.         Marland Kitchen diltiazem (DILACOR XR) 180 MG 24 hr capsule   Oral   Take 180 mg by mouth daily.         Marland Kitchen labetalol (NORMODYNE) 200 MG tablet   Oral   Take 200 mg by mouth 2 (two) times daily.          . Multiple Vitamin (MULITIVITAMIN WITH MINERALS) TABS   Oral   Take 1 tablet by mouth daily.         Marland Kitchen OVER THE COUNTER MEDICATION   Topical   Apply 1  application topically 2 (two) times daily as needed. Anti-Itch cream         . rosuvastatin (CRESTOR) 20 MG tablet   Oral   Take 20 mg by mouth daily.          BP 170/66  Pulse 57  Temp(Src) 97.6 F (36.4 C) (Oral)  Resp 16  Ht 5\' 7"  (1.702 m)  Wt 150 lb (68.04 kg)  BMI 23.49 kg/m2  SpO2 100% Physical Exam  Nursing note and vitals reviewed. Constitutional: She is oriented to person, place, and time. She appears well-developed. No distress.  HENT:  Head: Normocephalic and atraumatic.  Neck: Normal range of motion. Neck supple.  Cardiovascular: Normal rate, regular rhythm and normal heart sounds.   No murmur heard. Pulmonary/Chest: Effort normal and breath sounds normal. No respiratory distress.  Abdominal: Soft. Bowel sounds are normal. There is no tenderness.   Musculoskeletal:  No midline tenderness to palpation of the thoracic or lumbar spines. There is tenderness to palpation over the left paraspinous muscles without spasm noted.  Focused examination of the patient's left knee shows a small knee effusion. There is tenderness to palpation along the heel joint line. Patient has full range of motion both passively and actively. There is no evidence of erythema. Right knee also has a moderate effusion. There is full range of motion of this knee.  Neurological: She is alert and oriented to person, place, and time. She has normal reflexes.  Skin: Skin is warm and dry.  Psychiatric: She has a normal mood and affect.    ED Course  Procedures (including critical care time) Labs Review Labs Reviewed - No data to display Imaging Review Dg Knee 2 Views Left  11/24/2012   *RADIOLOGY REPORT*  Clinical Data: Knee pain  LEFT KNEE - 1-2 VIEW  Comparison: 10/06/2012  Findings: Normal alignment without fracture or effusion.  Preserved joint spaces.  No significant arthropathy.  No soft tissue abnormality.  IMPRESSION: No acute finding   Original Report Authenticated By: Judie Petit. Miles Costain, M.D.   Dg Knee 2 Views Right  11/24/2012   *RADIOLOGY REPORT*  Clinical Data: Knee pain  RIGHT KNEE - 1-2 VIEW  Comparison: 10/06/2012  Findings: Similar pattern of tricompartmental osteoarthritis, more pronounced in the lateral compartment.  Small joint effusion on the lateral view.  Normal alignment without fracture.  IMPRESSION: Tricompartmental osteoarthritis.  Small joint effusion.   Original Report Authenticated By: Judie Petit. Miles Costain, M.D.    MDM   1. Arthritis of knee   2. Back pain      This is a 62 year old female who presents with worsening of her chronic low back pain and knee pain. She is nontoxic-appearing on exam. She has no evidence of cauda equina. Both knee joints have full range of motion of have low suspicion for septic joint. Patient endorses that her pain is the same pain  she had one month ago when she presented but that it is just worse and she can't take it anymore. She does have a primary care physician but has not followed up with them. Plain films of bilateral knees were obtained. Patient was given IM Toradol after review of her labs shows a stable creatinine which is normal. The patient is driving and narcotic pain medication was deferred. Bilateral knee films show continued arthritis in the right knee and normal left knee films. Patient was encouraged to use Tylenol and ibuprofen as needed for both her back and knee pain. I discussed with her that narcotic pain  medication was inappropriate for these chronic symptoms.  She will followup with her primary care physician.  After history, exam, and medical workup I feel the patient has been appropriately medically screened and is safe for discharge home. Pertinent diagnoses were discussed with the patient. Patient was given return precautions.  Shon Baton, MD 11/24/12 [phone]  I was called back into the room following discharge as the patient was requesting pain medication. I had a long discussion with the patient regarding pain management home. After the Toradol, she continued to endorse left knee pain. Reexamination continues to show good range of motion and no swelling or erythema to the joint. I still have very low suspicion for septic knee.   I feel the patient's pain is most likely secondary to inflammatory change and arthritic changes. The most appropriate pain management is anti-inflammatories including ibuprofen scheduled for the next several days. The patient seemed very frustrated with this response. Given that she was unable to sleep last night secondary to her pain, I told her I would prescribe her 2 pills of Norco but that I still felt the most appropriate treatment of her pain was anti-inflammatories. She was encouraged to followup with her primary care physician for continued pain management. Patient was  given strict return precautions if she has developed fever, redness or increasing swelling of the site. She stated understanding.  Shon Baton, MD 11/24/12 1016

## 2012-11-24 NOTE — ED Notes (Signed)
Pt states she is having lower back pain and left knee and leg pain x 2 weeks. Also reports some nausea for the past 2 days.

## 2012-11-26 ENCOUNTER — Ambulatory Visit
Admission: RE | Admit: 2012-11-26 | Discharge: 2012-11-26 | Disposition: A | Discharge status: Home / Self Care | Payer: BC Managed Care – PPO | Source: Ambulatory Visit

## 2012-11-26 DIAGNOSIS — Z1231 Encounter for screening mammogram for malignant neoplasm of breast: Secondary | ICD-10-CM

## 2013-02-15 ENCOUNTER — Encounter (HOSPITAL_COMMUNITY): Payer: Self-pay | Admitting: Emergency Medicine

## 2013-02-15 ENCOUNTER — Emergency Department (HOSPITAL_COMMUNITY)
Admission: EM | Admit: 2013-02-15 | Discharge: 2013-02-15 | Disposition: A | Discharge status: Home / Self Care | Payer: BC Managed Care – PPO | Attending: Emergency Medicine | Admitting: Emergency Medicine

## 2013-02-15 DIAGNOSIS — Z79899 Other long term (current) drug therapy: Secondary | ICD-10-CM | POA: Insufficient documentation

## 2013-02-15 DIAGNOSIS — IMO0001 Reserved for inherently not codable concepts without codable children: Secondary | ICD-10-CM | POA: Insufficient documentation

## 2013-02-15 DIAGNOSIS — Z880 Allergy status to penicillin: Secondary | ICD-10-CM | POA: Insufficient documentation

## 2013-02-15 DIAGNOSIS — R6889 Other general symptoms and signs: Secondary | ICD-10-CM

## 2013-02-15 DIAGNOSIS — M549 Dorsalgia, unspecified: Secondary | ICD-10-CM | POA: Insufficient documentation

## 2013-02-15 DIAGNOSIS — R51 Headache: Secondary | ICD-10-CM | POA: Insufficient documentation

## 2013-02-15 DIAGNOSIS — G8929 Other chronic pain: Secondary | ICD-10-CM

## 2013-02-15 DIAGNOSIS — R112 Nausea with vomiting, unspecified: Secondary | ICD-10-CM | POA: Insufficient documentation

## 2013-02-15 DIAGNOSIS — R109 Unspecified abdominal pain: Secondary | ICD-10-CM | POA: Insufficient documentation

## 2013-02-15 DIAGNOSIS — M129 Arthropathy, unspecified: Secondary | ICD-10-CM | POA: Insufficient documentation

## 2013-02-15 DIAGNOSIS — M25569 Pain in unspecified knee: Secondary | ICD-10-CM | POA: Insufficient documentation

## 2013-02-15 DIAGNOSIS — I10 Essential (primary) hypertension: Secondary | ICD-10-CM | POA: Insufficient documentation

## 2013-02-15 DIAGNOSIS — Z91040 Latex allergy status: Secondary | ICD-10-CM | POA: Insufficient documentation

## 2013-02-15 DIAGNOSIS — K0889 Other specified disorders of teeth and supporting structures: Secondary | ICD-10-CM

## 2013-02-15 DIAGNOSIS — Z87891 Personal history of nicotine dependence: Secondary | ICD-10-CM | POA: Insufficient documentation

## 2013-02-15 DIAGNOSIS — K089 Disorder of teeth and supporting structures, unspecified: Secondary | ICD-10-CM | POA: Insufficient documentation

## 2013-02-15 DIAGNOSIS — R011 Cardiac murmur, unspecified: Secondary | ICD-10-CM | POA: Insufficient documentation

## 2013-02-15 DIAGNOSIS — R519 Headache, unspecified: Secondary | ICD-10-CM

## 2013-02-15 DIAGNOSIS — Z8719 Personal history of other diseases of the digestive system: Secondary | ICD-10-CM | POA: Insufficient documentation

## 2013-02-15 DIAGNOSIS — R197 Diarrhea, unspecified: Secondary | ICD-10-CM | POA: Insufficient documentation

## 2013-02-15 LAB — URINALYSIS, ROUTINE W REFLEX MICROSCOPIC
Bilirubin Urine: NEGATIVE
Glucose, UA: NEGATIVE mg/dL
Hgb urine dipstick: NEGATIVE
Ketones, ur: NEGATIVE mg/dL
Leukocytes, UA: NEGATIVE
Nitrite: NEGATIVE
Protein, ur: NEGATIVE mg/dL
Specific Gravity, Urine: 1.023 (ref 1.005–1.030)
Urobilinogen, UA: 0.2 mg/dL (ref 0.0–1.0)
pH: 6 (ref 5.0–8.0)

## 2013-02-15 LAB — CBC WITH DIFFERENTIAL/PLATELET
Basophils Absolute: 0 10*3/uL (ref 0.0–0.1)
Basophils Relative: 0 % (ref 0–1)
Eosinophils Absolute: 0.3 10*3/uL (ref 0.0–0.7)
Eosinophils Relative: 4 % (ref 0–5)
HCT: 37 % (ref 36.0–46.0)
Hemoglobin: 12.1 g/dL (ref 12.0–15.0)
Lymphocytes Relative: 23 % (ref 12–46)
Lymphs Abs: 2 10*3/uL (ref 0.7–4.0)
MCH: 31.4 pg (ref 26.0–34.0)
MCHC: 32.7 g/dL (ref 30.0–36.0)
MCV: 96.1 fL (ref 78.0–100.0)
Monocytes Absolute: 0.6 10*3/uL (ref 0.1–1.0)
Monocytes Relative: 6 % (ref 3–12)
Neutro Abs: 6 10*3/uL (ref 1.7–7.7)
Neutrophils Relative %: 67 % (ref 43–77)
Platelets: 269 10*3/uL (ref 150–400)
RBC: 3.85 MIL/uL — ABNORMAL LOW (ref 3.87–5.11)
RDW: 13.8 % (ref 11.5–15.5)
WBC: 9 10*3/uL (ref 4.0–10.5)

## 2013-02-15 LAB — COMPREHENSIVE METABOLIC PANEL
ALT: 16 U/L (ref 0–35)
AST: 25 U/L (ref 0–37)
Albumin: 3.6 g/dL (ref 3.5–5.2)
Alkaline Phosphatase: 90 U/L (ref 39–117)
BUN: 17 mg/dL (ref 6–23)
CO2: 28 mEq/L (ref 19–32)
Calcium: 9.4 mg/dL (ref 8.4–10.5)
Chloride: 105 mEq/L (ref 96–112)
Creatinine, Ser: 0.8 mg/dL (ref 0.50–1.10)
GFR calc Af Amer: 89 mL/min — ABNORMAL LOW (ref >90)
GFR calc non Af Amer: 77 mL/min — ABNORMAL LOW (ref >90)
Glucose, Bld: 77 mg/dL (ref 70–99)
Potassium: 3.6 mEq/L (ref 3.5–5.1)
Sodium: 144 mEq/L (ref 135–145)
Total Bilirubin: 0.3 mg/dL (ref 0.3–1.2)
Total Protein: 6.9 g/dL (ref 6.0–8.3)

## 2013-02-15 MED ORDER — METOCLOPRAMIDE HCL 10 MG PO TABS: 10.0000 mg | ORAL_TABLET | Freq: Four times a day (QID) | ORAL | Status: DC | PRN

## 2013-02-15 MED ORDER — MORPHINE SULFATE 4 MG/ML IJ SOLN
4.0000 mg | Freq: Once | INTRAMUSCULAR | Status: AC
  Administered 2013-02-15: 4 mg via INTRAVENOUS
  Filled 2013-02-15: qty 1

## 2013-02-15 NOTE — ED Provider Notes (Signed)
CSN: [id number]     Arrival date & time 02/15/13  1650 History   First MD Initiated Contact with Patient 02/15/13 1732     Chief Complaint  Patient presents with  . Headache  . Abdominal Pain   (Consider location/radiation/quality/duration/timing/severity/associated sxs/prior Treatment) HPI Pt is a 62yo female with hx of HTN and GI bleed c/o right frontal headache similar to previous headaches that started on Wednesday, 11/19.  Pain is constant, aching and throbbing, 7/10.  Has taken tylenol sinus with moderate relief at times.  Also c/o intermittent bilateral side abdominal pain that is aching and sharp on occasion but denies pain at this time. Denies fever, n/v/d. Reports hx of bowel blockage requiring abdominal surgery in 2010 but reports normal BMs. Denies recent travel or sick contacts. Denies dizziness, chest pain, SOB, change in vision. Denies numbness or tingling.   Past Medical History  Diagnosis Date  . Hypertension   . Arthritis   . GI bleed     hx  . Heart murmur    Past Surgical History  Procedure Laterality Date  . Knee surgery Right 1999  . Tubal ligation  1979  . Abdominal surgery  2010    for bowel blockage   Family History  Problem Relation Age of Onset  . Hypertension Mother   . Heart failure Father   . Hypertension Father    History  Substance Use Topics  . Smoking status: Former Smoker    Quit date: 07/29/2010  . Smokeless tobacco: Not on file  . Alcohol Use: No   OB History   Grav Para Term Preterm Abortions TAB SAB Ect Mult Living                 Review of Systems  Constitutional: Negative for fever and chills.  Respiratory: Negative for shortness of breath.   Cardiovascular: Negative for chest pain.  Gastrointestinal: Positive for nausea, vomiting, abdominal pain and diarrhea.  Genitourinary: Positive for flank pain. Negative for dysuria, urgency, frequency, decreased urine volume, vaginal bleeding, vaginal discharge, vaginal pain, menstrual  problem and pelvic pain.  Neurological: Positive for headaches. Negative for dizziness and light-headedness.  All other systems reviewed and are negative.    Allergies  Shrimp; Latex; and Penicillins  Home Medications   Current Outpatient Rx  Name  Route  Sig  Dispense  Refill  . Aspirin-Caffeine (BAYER BACK & BODY PAIN EX ST) 500-32.5 MG TABS   Oral   Take 1 tablet by mouth daily as needed (for pain).         Marland Kitchen diltiazem (DILACOR XR) 180 MG 24 hr capsule   Oral   Take 180 mg by mouth daily.         Marland Kitchen labetalol (NORMODYNE) 200 MG tablet   Oral   Take 200 mg by mouth 2 (two) times daily.          . Multiple Vitamin (MULITIVITAMIN WITH MINERALS) TABS   Oral   Take 1 tablet by mouth daily.         . Pseudoephedrine-Naproxen Na (ALEVE-D SINUS & COLD) 120-220 MG TB12   Oral   Take 1 tablet by mouth daily as needed (for sinus relief).         . rosuvastatin (CRESTOR) 20 MG tablet   Oral   Take 20 mg by mouth every morning.          . metoCLOPramide (REGLAN) 10 MG tablet   Oral   Take 1 tablet (10 mg  total) by mouth every 6 (six) hours as needed for nausea or vomiting (nausea and vomiting associated with headaches.).   15 tablet   0    BP 169/65  Pulse 71  Temp(Src) 97.9 F (36.6 C) (Oral)  Resp 20  Ht 5\' 3"  (1.6 m)  Wt 152 lb (68.947 kg)  BMI 26.93 kg/m2  SpO2 100% Physical Exam  Nursing note and vitals reviewed. Constitutional: She is oriented to person, place, and time. She appears well-developed and well-nourished. No distress.  Pt lying comfortably in exam bed, lights in room turned off. NAD.     HENT:  Head: Normocephalic and atraumatic.  Eyes: Conjunctivae and EOM are normal. Pupils are equal, round, and reactive to light. Right eye exhibits no discharge. Left eye exhibits no discharge. No scleral icterus.  Neck: Normal range of motion. Neck supple.  Cardiovascular: Normal rate, regular rhythm and normal heart sounds.   Pulmonary/Chest:  Effort normal and breath sounds normal. No respiratory distress. She has no wheezes. She has no rales. She exhibits no tenderness.  Abdominal: Soft. Bowel sounds are normal. She exhibits no distension and no mass. There is no tenderness. There is no rebound and no guarding.  Soft, non-distended, non-tender  Musculoskeletal: Normal range of motion. She exhibits no edema and no tenderness.  Neurological: She is alert and oriented to person, place, and time. No cranial nerve deficit. Coordination normal.  CN II-XII in tact, no focal deficit, nl finger to nose coordination. Nl sensation, 5/5 strength in all major muscle groups. Neg romberg and nl gait.  Skin: Skin is warm and dry. She is not diaphoretic.    ED Course  Procedures (including critical care time) Labs Review Labs Reviewed  CBC WITH DIFFERENTIAL - Abnormal; Notable for the following:    RBC 3.85 (*)    All other components within normal limits  COMPREHENSIVE METABOLIC PANEL - Abnormal; Notable for the following:    GFR calc non Af Amer 77 (*)    GFR calc Af Amer 89 (*)    All other components within normal limits  URINALYSIS, ROUTINE W REFLEX MICROSCOPIC   Imaging Review No results found.  EKG Interpretation   None       MDM   1. Drug-seeking behavior   2. Multiple complaints   3. Headache   4. Chronic knee pain, right   5. Tooth pain   6. Side pain    Pt initially c/o right frontal headache, described as gradual in onset, similar to previous believes it may be a migraine or tension headache. Also c/o intermittent side pain.  On exam: normal neuro exam, abdomen: soft, non-distended, non-tender. Pt stated her sides no longer hurt.  Will get basic labs due to pt's age as well as UA for c/o side pain although unlikely as pt denies vaginal or urinary complaints, no pelvic exam necessary at this time.  Tx for pain: morphine   CBC and CMP: unremarkable.   UA: unremarkable.  When pt was given pain medication, pt was  asked by nurse how she was doing, pt stated when she is in pain her BP goes up and stated her right knee was in pain.  Pt did not mention headache or abdominal pain.     When I went to check on pt after morphine was given, pt asked what was still being put into her IV (pointing to saline bag).  I asked again how pain was and pt asked if she was still receiving pain  medication and wanted to know how long it takes to kick in.  Pt would not answer 'yes' or 'no' as to whether or not her pain had improved after being given medication.  When UA was being discussed with pt, she asked if tingling in her foot could be due to gout and also asked if her teeth could be source of her pain.  Also demanded to have a [place] sandwhich before being discharged home.  Tried to ask pt how her headache was doing but pt would not answer and kept asking different questions about her chronic knee pain and if she could get a free dental referral, although she did not mention dental pain during initial H&P.  Discussed with pt: labs were normal and her neuro exam was also normal.  Pt advised to f/u with Dr. Leanord Asal for dental pain in 24-48hrs and to f/u with her PCP for ongoing healthcare needs as well as her orthopedist for chronic knee pain.  Pt demanded to speak with Dr. Silverio Lay who also examined pt and agreed she may be discharged home, will give Reglan PRN for her headaches.  Return precautions provided.    Junius Finner, PA-C 02/15/13 2042

## 2013-02-15 NOTE — ED Notes (Signed)
Pt pulled out her IV and placed in garbage.  When ask why she did this pt stated that she was getting ready to be discharged.

## 2013-02-15 NOTE — ED Provider Notes (Signed)
Medical screening examination/treatment/procedure(s) were conducted as a shared visit with non-physician practitioner(s) and myself.  I personally evaluated the patient during the encounter.  EKG Interpretation   None        [name] is a 62 y.o. female here with pain. States headaches, toothache, knee pain, back pain. Upon further questioning, she has some myalgias and sinus congestion. Labs nl, UA nl. I think she likely has some myalgias. She is well appearing. She asked to speak to me. I updated her with her results and reassured her. Will give prn reglan for headaches and refer her to a dentist.    Richardean Canal, MD 02/15/13 2200

## 2013-02-15 NOTE — ED Notes (Signed)
Pt states she's had a frontal headache since Wednesday.  Pt states she's taken Advil Sinus with no relief.  Pt also c/o lower abdominal pain since Wednesday as well.  Pt denies N/V/D.

## 2013-02-15 NOTE — ED Provider Notes (Signed)
9:45 - Patient, on her way out of the ED following d/c, read through her diagnoses and became very angry about her diagnosis of "drug seeking behavior". She, at this time, insisted to speak with a provider. I sat down with the patient and explained that neither the doctor or physician assistant who took care of her today were still in the emergency department. I told the patient that I am not able to speak to the reasons why she was diagnosed with "drug seeking behavior" and that I have no ability to change this diagnosis in her chart. Patient verbalized understanding. I did tell the patient that I would contact Dr. Silverio Lay and Junius Finner regarding her complaint and that she would be contacted by a hospital administrator regarding her experience early this coming week. Patient verbalized comfort and understanding with this plan as well. I asked the patient whether she had any additional complaints at this time to which she replied, "no". Patient left the emergency department today with no unaddressed concerns. Dr. Wayland Salinas made aware.  Antony Madura, PA-C 02/15/13 2211

## 2013-02-15 NOTE — ED Notes (Signed)
Pt st's she has had a headache for approx 3 days.   St's has taken OTC meds without relief.  Pt alert and oriented x's 3, skin warm and dry color appropriate.  Pt denies any nausea or vomiting

## 2013-02-16 NOTE — ED Provider Notes (Signed)
Medical screening examination/treatment/procedure(s) were performed by non-physician practitioner and as supervising physician I was immediately available for consultation/collaboration.  EKG Interpretation   None         Joya Gaskins, MD 02/16/13 [phone]

## 2013-05-30 ENCOUNTER — Encounter (HOSPITAL_COMMUNITY): Payer: Self-pay | Admitting: Emergency Medicine

## 2013-05-30 DIAGNOSIS — G8929 Other chronic pain: Secondary | ICD-10-CM | POA: Insufficient documentation

## 2013-05-30 DIAGNOSIS — Z8719 Personal history of other diseases of the digestive system: Secondary | ICD-10-CM | POA: Insufficient documentation

## 2013-05-30 DIAGNOSIS — Z91040 Latex allergy status: Secondary | ICD-10-CM | POA: Insufficient documentation

## 2013-05-30 DIAGNOSIS — H6120 Impacted cerumen, unspecified ear: Secondary | ICD-10-CM | POA: Insufficient documentation

## 2013-05-30 DIAGNOSIS — R011 Cardiac murmur, unspecified: Secondary | ICD-10-CM | POA: Insufficient documentation

## 2013-05-30 DIAGNOSIS — Z87891 Personal history of nicotine dependence: Secondary | ICD-10-CM | POA: Insufficient documentation

## 2013-05-30 DIAGNOSIS — I10 Essential (primary) hypertension: Secondary | ICD-10-CM | POA: Insufficient documentation

## 2013-05-30 DIAGNOSIS — Z79899 Other long term (current) drug therapy: Secondary | ICD-10-CM | POA: Insufficient documentation

## 2013-05-30 DIAGNOSIS — M25569 Pain in unspecified knee: Secondary | ICD-10-CM | POA: Insufficient documentation

## 2013-05-30 DIAGNOSIS — M129 Arthropathy, unspecified: Secondary | ICD-10-CM | POA: Insufficient documentation

## 2013-05-30 DIAGNOSIS — Z880 Allergy status to penicillin: Secondary | ICD-10-CM | POA: Insufficient documentation

## 2013-05-30 NOTE — ED Notes (Signed)
The pt is c/o lt ear pain for 2 days.  Unknown if temp

## 2013-05-31 ENCOUNTER — Emergency Department (HOSPITAL_COMMUNITY)
Admission: EM | Admit: 2013-05-31 | Discharge: 2013-05-31 | Disposition: A | Discharge status: Home / Self Care | Payer: BC Managed Care – PPO | Attending: Emergency Medicine | Admitting: Emergency Medicine

## 2013-05-31 DIAGNOSIS — H6120 Impacted cerumen, unspecified ear: Secondary | ICD-10-CM

## 2013-05-31 MED ORDER — DOCUSATE SODIUM 50 MG/5ML PO LIQD
50.0000 mg | Freq: Once | ORAL | Status: AC
  Administered 2013-05-31: 50 mg via OTIC
  Filled 2013-05-31: qty 10

## 2013-05-31 MED ORDER — IBUPROFEN 400 MG PO TABS
800.0000 mg | ORAL_TABLET | Freq: Once | ORAL | Status: AC
  Administered 2013-05-31: 800 mg via ORAL
  Filled 2013-05-31: qty 2

## 2013-05-31 NOTE — Discharge Instructions (Signed)
Cerumen Impaction A cerumen impaction is when the wax in your ear forms a plug. This plug usually causes reduced hearing. Sometimes it also causes an earache or dizziness. Removing a cerumen impaction can be difficult and painful. The wax sticks to the ear canal. The canal is sensitive and bleeds easily. If you try to remove a heavy wax buildup with a cotton tipped swab, you may push it in further. Irrigation with water, suction, and small ear curettes may be used to clear out the wax. If the impaction is fixed to the skin in the ear canal, ear drops may be needed for a few days to loosen the wax. People who build up a lot of wax frequently can use ear wax removal products available in your local drugstore. SEEK MEDICAL CARE IF:  You develop an earache, increased hearing loss, or marked dizziness. Document Released: 04/20/2004 Document Revised: 06/05/2011 Document Reviewed: 06/10/2009 [employer] Patient Information 2014 [employer].  

## 2013-05-31 NOTE — ED Provider Notes (Signed)
CSN: [id number]     Arrival date & time 05/30/13  2346 History   First MD Initiated Contact with Patient 05/31/13 0051     Chief Complaint  Patient presents with  . Otalgia     (Consider location/radiation/quality/duration/timing/severity/associated sxs/prior Treatment) Patient is a 63 y.o. female presenting with ear pain. The history is provided by the patient.  Otalgia Location:  Left Quality:  Aching Severity:  Mild Onset quality:  Gradual Duration:  2 days Timing:  Constant Relieved by:  None tried Worsened by:  Nothing tried Ineffective treatments:  None tried Associated symptoms: no ear discharge, no fever, no headaches, no rhinorrhea, no sore throat and no tinnitus   Risk factors: no chronic ear infection     Past Medical History  Diagnosis Date  . Hypertension   . Arthritis   . GI bleed     hx  . Heart murmur    Past Surgical History  Procedure Laterality Date  . Knee surgery Right 1999  . Tubal ligation  1979  . Abdominal surgery  2010    for bowel blockage   Family History  Problem Relation Age of Onset  . Hypertension Mother   . Heart failure Father   . Hypertension Father    History  Substance Use Topics  . Smoking status: Former Smoker    Quit date: 07/29/2010  . Smokeless tobacco: Not on file  . Alcohol Use: No   OB History   Grav Para Term Preterm Abortions TAB SAB Ect Mult Living                 Review of Systems  Constitutional: Negative for fever and chills.  HENT: Positive for ear pain. Negative for ear discharge, rhinorrhea, sore throat and tinnitus.   Musculoskeletal: Positive for arthralgias.  Skin: Negative for wound.  Neurological: Negative for dizziness and headaches.      Allergies  Shrimp; Latex; and Penicillins  Home Medications   Current Outpatient Rx  Name  Route  Sig  Dispense  Refill  . Aspirin-Caffeine (BAYER BACK & BODY PAIN EX ST) 500-32.5 MG TABS   Oral   Take 1 tablet by mouth daily as needed (for  pain).         Marland Kitchen diltiazem (DILACOR XR) 180 MG 24 hr capsule   Oral   Take 180 mg by mouth daily.         Marland Kitchen labetalol (NORMODYNE) 200 MG tablet   Oral   Take 200 mg by mouth 2 (two) times daily.          . metoCLOPramide (REGLAN) 10 MG tablet   Oral   Take 1 tablet (10 mg total) by mouth every 6 (six) hours as needed for nausea or vomiting (nausea and vomiting associated with headaches.).   15 tablet   0   . Multiple Vitamin (MULITIVITAMIN WITH MINERALS) TABS   Oral   Take 1 tablet by mouth daily.         . Pseudoephedrine-Naproxen Na (ALEVE-D SINUS & COLD) 120-220 MG TB12   Oral   Take 1 tablet by mouth daily as needed (for sinus relief).         . rosuvastatin (CRESTOR) 20 MG tablet   Oral   Take 20 mg by mouth every morning.           BP 199/67  Pulse 59  Temp(Src) 97.8 F (36.6 C) (Oral)  Resp 18  SpO2 100% Physical Exam  Nursing note and  vitals reviewed. Constitutional: She is oriented to person, place, and time. She appears well-developed and well-nourished.  HENT:  Head: Normocephalic.  Right Ear: Hearing, tympanic membrane and external ear normal.  Left Ear: External ear normal. No swelling.  Cerumen impaction L ear   Eyes: Pupils are equal, round, and reactive to light.  Neck: Normal range of motion.  Cardiovascular: Normal rate.   Pulmonary/Chest: Effort normal.  Musculoskeletal: Normal range of motion. She exhibits no edema and no tenderness.  Chronic bilateral knee pain   Lymphadenopathy:    She has no cervical adenopathy.  Neurological: She is alert and oriented to person, place, and time.  Skin: Skin is warm and dry. No erythema.    ED Course  Procedures (including critical care time) Labs Review Labs Reviewed - No data to display Imaging Review No results found.   EKG Interpretation None      MDM   Final diagnoses:  Cerumen impaction         Garald Balding, NP 05/31/13 3810

## 2013-06-01 NOTE — ED Provider Notes (Signed)
Medical screening examination/treatment/procedure(s) were performed by non-physician practitioner and as supervising physician I was immediately available for consultation/collaboration.   Teressa Lower, MD 06/01/13 [phone]

## 2013-06-04 ENCOUNTER — Encounter (HOSPITAL_COMMUNITY): Payer: Self-pay | Admitting: Emergency Medicine

## 2013-06-04 ENCOUNTER — Emergency Department (HOSPITAL_COMMUNITY)
Admission: EM | Admit: 2013-06-04 | Discharge: 2013-06-05 | Disposition: A | Discharge status: Home / Self Care | Payer: BC Managed Care – PPO | Attending: Emergency Medicine | Admitting: Emergency Medicine

## 2013-06-04 DIAGNOSIS — R011 Cardiac murmur, unspecified: Secondary | ICD-10-CM | POA: Insufficient documentation

## 2013-06-04 DIAGNOSIS — M25469 Effusion, unspecified knee: Secondary | ICD-10-CM | POA: Insufficient documentation

## 2013-06-04 DIAGNOSIS — Z880 Allergy status to penicillin: Secondary | ICD-10-CM | POA: Insufficient documentation

## 2013-06-04 DIAGNOSIS — M25462 Effusion, left knee: Secondary | ICD-10-CM

## 2013-06-04 DIAGNOSIS — I10 Essential (primary) hypertension: Secondary | ICD-10-CM | POA: Insufficient documentation

## 2013-06-04 DIAGNOSIS — Z79899 Other long term (current) drug therapy: Secondary | ICD-10-CM | POA: Insufficient documentation

## 2013-06-04 DIAGNOSIS — M129 Arthropathy, unspecified: Secondary | ICD-10-CM | POA: Insufficient documentation

## 2013-06-04 DIAGNOSIS — Z91040 Latex allergy status: Secondary | ICD-10-CM | POA: Insufficient documentation

## 2013-06-04 DIAGNOSIS — Z87891 Personal history of nicotine dependence: Secondary | ICD-10-CM | POA: Insufficient documentation

## 2013-06-04 DIAGNOSIS — Z9889 Other specified postprocedural states: Secondary | ICD-10-CM | POA: Insufficient documentation

## 2013-06-04 DIAGNOSIS — Z8719 Personal history of other diseases of the digestive system: Secondary | ICD-10-CM | POA: Insufficient documentation

## 2013-06-04 NOTE — ED Notes (Signed)
Pt reports non-traumatic left knee pain, progressively worse - pt saw her orthopedist yesterday and was given a cortisone injection into the left knee, pt states she has never experienced pain this severe to her knee before. Pt tearful during assessment.

## 2013-06-05 ENCOUNTER — Emergency Department (HOSPITAL_COMMUNITY): Payer: BC Managed Care – PPO

## 2013-06-05 MED ORDER — IBUPROFEN 200 MG PO TABS
400.0000 mg | ORAL_TABLET | Freq: Once | ORAL | Status: AC
  Administered 2013-06-05: 400 mg via ORAL
  Filled 2013-06-05: qty 2

## 2013-06-05 MED ORDER — HYDROCODONE-ACETAMINOPHEN 5-325 MG PO TABS: 1.0000 | ORAL_TABLET | Freq: Four times a day (QID) | ORAL | Status: DC | PRN

## 2013-06-05 MED ORDER — HYDROCODONE-ACETAMINOPHEN 5-325 MG PO TABS
1.0000 | ORAL_TABLET | Freq: Once | ORAL | Status: AC
  Administered 2013-06-05: 1 via ORAL
  Filled 2013-06-05: qty 1

## 2013-06-05 NOTE — Discharge Instructions (Signed)
Knee Effusion  Knee effusion means you have fluid in your knee. The knee may be more difficult to bend and move. HOME CARE  Use crutches or a brace as told by your doctor.  Put ice on the injured area.  Put ice in a plastic bag.  Place a towel between your skin and the bag.  Leave the ice on for 15-20 minutes, 03-04 times a day.  Raise (elevate) your knee as much as possible.  Only take medicine as told by your doctor.  You may need to do strengthening exercises. Ask your doctor.  Continue with your normal diet and activities as told by your doctor. GET HELP RIGHT AWAY IF:  You have more puffiness (swelling) in your knee.  You see redness, puffiness, or have more pain in your knee.  You have a temperature by mouth above 102 F (38.9 C).  You get a rash.  You have trouble breathing.  You have a reaction to any medicine you are taking.  You have a lot of pain when you move your knee. MAKE SURE YOU:  Understand these instructions.  Will watch your condition.  Will get help right away if you are not doing well or get worse. Document Released: 04/15/2010 Document Revised: 06/05/2011 Document Reviewed: 04/15/2010 [facility] Patient Information 2014 [place], [place]. Call your orthopedist in the morning to set appointment for followup

## 2013-06-05 NOTE — ED Provider Notes (Signed)
CSN: [id number]     Arrival date & time 06/04/13  2339 History   First MD Initiated Contact with Patient 06/05/13 [phone]     Chief Complaint  Patient presents with  . Knee Pain     (Consider location/radiation/quality/duration/timing/severity/associated sxs/prior Treatment) HPI Comments: Patient states, that she was seen by [facility] orthopedics.  2, days, ago, and had her left knee joint injected.  This has not helped.  Her pain at all.  She has not taken any other pain medicine.  She has not contacted [facility] orthopedics to inform them that the injection.  Has not relieved her discomfort  Patient is a 63 y.o. female presenting with knee pain. The history is provided by the patient.  Knee Pain Location:  Knee Injury: no   Knee location:  L knee Pain details:    Quality:  Aching   Radiates to:  Does not radiate   Severity:  Severe   Onset quality:  Unable to specify   Timing:  Constant   Progression:  Worsening Chronicity:  Chronic Dislocation: no   Foreign body present:  No foreign bodies Tetanus status:  Unknown Prior injury to area:  No Relieved by:  Nothing Worsened by:  Nothing tried Ineffective treatments:  None tried Associated symptoms: no fever     Past Medical History  Diagnosis Date  . Hypertension   . Arthritis   . GI bleed     hx  . Heart murmur    Past Surgical History  Procedure Laterality Date  . Knee surgery Right 1999  . Tubal ligation  1979  . Abdominal surgery  2010    for bowel blockage   Family History  Problem Relation Age of Onset  . Hypertension Mother   . Heart failure Father   . Hypertension Father    History  Substance Use Topics  . Smoking status: Former Smoker    Quit date: 07/29/2010  . Smokeless tobacco: Not on file  . Alcohol Use: No   OB History   Grav Para Term Preterm Abortions TAB SAB Ect Mult Living                 Review of Systems  Constitutional: Negative for fever.  Musculoskeletal: Positive for joint  swelling.  Skin: Negative for rash and wound.  All other systems reviewed and are negative.      Allergies  Shrimp; Latex; and Penicillins  Home Medications   Current Outpatient Rx  Name  Route  Sig  Dispense  Refill  . Aspirin-Caffeine (BAYER BACK & BODY PAIN EX ST) 500-32.5 MG TABS   Oral   Take 1 tablet by mouth daily as needed (for pain).         Marland Kitchen diltiazem (DILACOR XR) 180 MG 24 hr capsule   Oral   Take 180 mg by mouth daily.         Marland Kitchen labetalol (NORMODYNE) 200 MG tablet   Oral   Take 200 mg by mouth 2 (two) times daily.          . Multiple Vitamin (MULITIVITAMIN WITH MINERALS) TABS   Oral   Take 1 tablet by mouth daily.         . rosuvastatin (CRESTOR) 20 MG tablet   Oral   Take 20 mg by mouth every morning.          Marland Kitchen HYDROcodone-acetaminophen (NORCO/VICODIN) 5-325 MG per tablet   Oral   Take 1 tablet by mouth every 6 (six) hours  as needed for moderate pain.   8 tablet   0    BP 192/87  Pulse 82  Temp(Src) 98.5 F (36.9 C) (Oral)  Resp 22  Ht 5\' 3"  (1.6 m)  Wt 150 lb (68.04 kg)  BMI 26.58 kg/m2  SpO2 100% Physical Exam  Vitals reviewed. Constitutional: She appears well-developed and well-nourished.  HENT:  Head: Normocephalic.  Eyes: Pupils are equal, round, and reactive to light.  Neck: Normal range of motion.  Cardiovascular: Normal rate.   Pulmonary/Chest: Effort normal.  Musculoskeletal: She exhibits no edema and no tenderness.  Neurological: She is alert.  Skin: Skin is warm. No rash noted. No erythema.    ED Course  Procedures (including critical care time) Labs Review Labs Reviewed - No data to display Imaging Review Dg Knee Complete 4 Views Left  06/05/2013   CLINICAL DATA Left knee pain.  EXAM LEFT KNEE - COMPLETE 4+ VIEW  COMPARISON November 24, 2012.  FINDINGS There is no evidence of fracture or dislocation. Mild suprapatellar joint effusion is noted. There is no evidence of arthropathy or other focal bone abnormality.  Soft tissues are unremarkable.  IMPRESSION Mild suprapatellar joint effusion is noted. No other abnormality seen in the left knee.  SIGNATURE  Electronically Signed   By: Sabino Dick M.D.   On: 06/05/2013 02:59     EKG Interpretation None      MDM  X-ray show, she is a small joint, effusion.  She's been placed in a knee sleeve for compression.  I suspect, applied.  She's been instructed to elevate her leg and follow up with her orthopedist Final diagnoses:  Knee effusion, left         Garald Balding, NP 06/05/13 [phone]

## 2013-06-06 NOTE — ED Provider Notes (Signed)
Medical screening examination/treatment/procedure(s) were performed by non-physician practitioner and as supervising physician I was immediately available for consultation/collaboration.   EKG Interpretation None       Varney Biles, MD 06/06/13 1930

## 2013-07-10 ENCOUNTER — Encounter (HOSPITAL_COMMUNITY): Payer: Self-pay | Admitting: Emergency Medicine

## 2013-07-10 ENCOUNTER — Emergency Department (HOSPITAL_COMMUNITY)
Admission: EM | Admit: 2013-07-10 | Discharge: 2013-07-11 | Disposition: A | Discharge status: Home / Self Care | Payer: BC Managed Care – PPO | Attending: Emergency Medicine | Admitting: Emergency Medicine

## 2013-07-10 DIAGNOSIS — M1712 Unilateral primary osteoarthritis, left knee: Secondary | ICD-10-CM

## 2013-07-10 DIAGNOSIS — Z91040 Latex allergy status: Secondary | ICD-10-CM | POA: Insufficient documentation

## 2013-07-10 DIAGNOSIS — Z79899 Other long term (current) drug therapy: Secondary | ICD-10-CM | POA: Insufficient documentation

## 2013-07-10 DIAGNOSIS — I10 Essential (primary) hypertension: Secondary | ICD-10-CM | POA: Insufficient documentation

## 2013-07-10 DIAGNOSIS — M1710 Unilateral primary osteoarthritis, unspecified knee: Secondary | ICD-10-CM | POA: Insufficient documentation

## 2013-07-10 DIAGNOSIS — IMO0002 Reserved for concepts with insufficient information to code with codable children: Secondary | ICD-10-CM | POA: Insufficient documentation

## 2013-07-10 DIAGNOSIS — Z87891 Personal history of nicotine dependence: Secondary | ICD-10-CM | POA: Insufficient documentation

## 2013-07-10 DIAGNOSIS — K029 Dental caries, unspecified: Secondary | ICD-10-CM | POA: Insufficient documentation

## 2013-07-10 DIAGNOSIS — Z880 Allergy status to penicillin: Secondary | ICD-10-CM | POA: Insufficient documentation

## 2013-07-10 DIAGNOSIS — R011 Cardiac murmur, unspecified: Secondary | ICD-10-CM | POA: Insufficient documentation

## 2013-07-10 DIAGNOSIS — Z8719 Personal history of other diseases of the digestive system: Secondary | ICD-10-CM | POA: Insufficient documentation

## 2013-07-10 NOTE — ED Notes (Signed)
Pt reports hx of arthritis and c/o of L knee pain that has been on going for some time. Pt states that she is limping which is new to her and has swelling to L knee.. Pt alert and sitting in wheelchair.

## 2013-07-11 ENCOUNTER — Emergency Department (HOSPITAL_COMMUNITY): Payer: BC Managed Care – PPO

## 2013-07-11 MED ORDER — KETOROLAC TROMETHAMINE 60 MG/2ML IM SOLN
30.0000 mg | Freq: Once | INTRAMUSCULAR | Status: AC
  Administered 2013-07-11: 30 mg via INTRAMUSCULAR
  Filled 2013-07-11: qty 2

## 2013-07-11 MED ORDER — HYDROCODONE-ACETAMINOPHEN 5-325 MG PO TABS
1.0000 | ORAL_TABLET | Freq: Four times a day (QID) | ORAL | Status: DC | PRN
Start: 2013-07-11 — End: 2013-09-22

## 2013-07-11 NOTE — ED Provider Notes (Signed)
CSN: [id number]     Arrival date & time 07/10/13  2339 History   First MD Initiated Contact with Patient 07/11/13 0010     Chief Complaint  Patient presents with  . Knee Pain     (Consider location/radiation/quality/duration/timing/severity/associated sxs/prior Treatment) HPI This is a 63 year old female with a history of left knee pain for the last 2 months. It is acutely worsened over the last several days and the pain is now severe. Pain is worse with palpation, movement or ambulation. She is having to ambulate with a cane now. She denies any acute injury. She's been taking ibuprofen but this is no longer relieving her pain. There is some swelling of that knee.  Past Medical History  Diagnosis Date  . Hypertension   . Arthritis   . GI bleed     hx  . Heart murmur    Past Surgical History  Procedure Laterality Date  . Knee surgery Right 1999  . Tubal ligation  1979  . Abdominal surgery  2010    for bowel blockage   Family History  Problem Relation Age of Onset  . Hypertension Mother   . Heart failure Father   . Hypertension Father    History  Substance Use Topics  . Smoking status: Former Smoker    Quit date: 07/29/2010  . Smokeless tobacco: Not on file  . Alcohol Use: No   OB History   Grav Para Term Preterm Abortions TAB SAB Ect Mult Living                 Review of Systems  All other systems reviewed and are negative.   Allergies  Shrimp; Latex; and Penicillins  Home Medications   Prior to Admission medications   Medication Sig Start Date End Date Taking? Authorizing Provider  Aspirin-Caffeine (BAYER BACK & BODY PAIN EX ST) 500-32.5 MG TABS Take 1 tablet by mouth daily as needed (for pain).    Historical Provider, MD  diltiazem (DILACOR XR) 180 MG 24 hr capsule Take 180 mg by mouth daily.    Historical Provider, MD  HYDROcodone-acetaminophen (NORCO/VICODIN) 5-325 MG per tablet Take 1 tablet by mouth every 6 (six) hours as needed for moderate pain.  06/05/13   Garald Balding, NP  labetalol (NORMODYNE) 200 MG tablet Take 200 mg by mouth 2 (two) times daily.     Historical Provider, MD  Multiple Vitamin (MULITIVITAMIN WITH MINERALS) TABS Take 1 tablet by mouth daily.    Historical Provider, MD  rosuvastatin (CRESTOR) 20 MG tablet Take 20 mg by mouth every morning.     Historical Provider, MD   BP 192/79  Pulse 82  Temp(Src) 98.2 F (36.8 C) (Oral)  Resp 20  SpO2 99%  Physical Exam General: Well-developed, well-nourished female in no acute distress; appearance consistent with age of record HENT: normocephalic; atraumatic; poor dentition with multiple caries Eyes: pupils equal, round and reactive to light; extraocular muscles intact Neck: supple Heart: regular rate and rhythm Lungs: clear to auscultation bilaterally Abdomen: soft; nondistended; nontender; bowel sounds present Extremities: Surgical changes to right knee; small effusion of left knee with tenderness to percussion of the patella and pain on passive range of motion without gross instability; pulses normal Neurologic: Awake, alert and oriented; motor function intact in all extremities and symmetric; no facial droop Skin: Warm and dry Psychiatric: Tearful    ED Course  Procedures (including critical care time)    MDM  Nursing notes and vitals signs, including pulse oximetry,  reviewed.  Summary of this visit's results, reviewed by myself:  Imaging Studies: Dg Knee Complete 4 Views Left  07/11/2013   CLINICAL DATA:  No known injury, knee pain  EXAM: LEFT KNEE - COMPLETE 4+ VIEW  COMPARISON:  DG KNEE COMPLETE 4 VIEWS*L* dated 06/05/2013  FINDINGS: No acute fracture or dislocation. Small joint effusion. 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  IMPRESSION: No acute osseous injury of the left knee.  Small joint effusion.   Electronically Signed   By: Kathreen Devoid   On: 07/11/2013 01:10   1:25 AM Significant relief with IM Toradol. Her right knee surgery  was done by [facility] and we will refer her back for evaluation of her left knee.     Wynetta Fines, MD 07/11/13 [phone]

## 2013-07-11 NOTE — Discharge Instructions (Signed)
Osteoarthritis Osteoarthritis is a disease that causes soreness and swelling (inflammation) of a joint. It occurs when the cartilage at the affected joint wears down. Cartilage acts as a cushion, covering the ends of bones where they meet to form a joint. Osteoarthritis is the most common form of arthritis. It often occurs in older people. The joints affected most often by this condition include those in the:  Ends of the fingers.  Thumbs.  Neck.  Lower back.  Knees.  Hips. CAUSES  Over time, the cartilage that covers the ends of bones begins to wear away. This causes bone to rub on bone, producing pain and stiffness in the affected joints.  RISK FACTORS Certain factors can increase your chances of having osteoarthritis, including:  Older age.  Excessive body weight.  Overuse of joints. SIGNS AND SYMPTOMS   Pain, swelling, and stiffness in the joint.  Over time, the joint may lose its normal shape.  Small deposits of bone (osteophytes) may grow on the edges of the joint.  Bits of bone or cartilage can break off and float inside the joint space. This may cause more pain and damage. DIAGNOSIS  Your health care provider will do a physical exam and ask about your symptoms. Various tests may be ordered, such as:  X-rays of the affected joint.  An MRI scan.  Blood tests to rule out other types of arthritis.  Joint fluid tests. This involves using a needle to draw fluid from the joint and examining the fluid under a microscope. TREATMENT  Goals of treatment are to control pain and improve joint function. Treatment plans may include:  A prescribed exercise program that allows for rest and joint relief.  A weight control plan.  Pain relief techniques, such as:  Properly applied heat and cold.  Electric pulses delivered to nerve endings under the skin (transcutaneous electrical nerve stimulation, TENS).  Massage.  Certain nutritional supplements.  Medicines to  control pain, such as:  Acetaminophen.  Nonsteroidal anti-inflammatory drugs (NSAIDs), such as naproxen.  Narcotic or central-acting agents, such as tramadol.  Corticosteroids. These can be given orally or as an injection.  Surgery to reposition the bones and relieve pain (osteotomy) or to remove loose pieces of bone and cartilage. Joint replacement may be needed in advanced states of osteoarthritis. HOME CARE INSTRUCTIONS   Only take over-the-counter or prescription medicines as directed by your health care provider. Take all medicines exactly as instructed.  Maintain a healthy weight. Follow your health care provider's instructions for weight control. This may include dietary instructions.  Exercise as directed. Your health care provider can recommend specific types of exercise. These may include:  Strengthening exercises These are done to strengthen the muscles that support joints affected by arthritis. They can be performed with weights or with exercise bands to add resistance.  Aerobic activities These are exercises, such as brisk walking or low-impact aerobics, that get your heart pumping.  Range-of-motion activities These keep your joints limber.  Balance and agility exercises These help you maintain daily living skills.  Rest your affected joints as directed by your health care provider.  Follow up with your health care provider as directed. SEEK MEDICAL CARE IF:   Your skin turns red.  You develop a rash in addition to your joint pain.  You have worsening joint pain. SEEK IMMEDIATE MEDICAL CARE IF:  You have a significant loss of weight or appetite.  You have a fever along with joint or muscle aches.  You have   night sweats. FOR MORE INFORMATION  National Institute of Arthritis and Musculoskeletal and Skin Diseases: www.niams.nih.gov National Institute on Aging: www.nia.nih.gov American College of Rheumatology: www.rheumatology.org Document Released: 03/13/2005  Document Revised: 01/01/2013 Document Reviewed: 11/18/2012 [employer] Patient Information 2014 [employer].  

## 2013-07-29 ENCOUNTER — Other Ambulatory Visit: Payer: Self-pay | Admitting: Orthopedic Surgery

## 2013-09-19 ENCOUNTER — Other Ambulatory Visit: Payer: Self-pay | Admitting: Orthopedic Surgery

## 2013-09-22 ENCOUNTER — Encounter (HOSPITAL_COMMUNITY): Payer: Self-pay | Admitting: Pharmacy Technician

## 2013-09-23 NOTE — Patient Instructions (Addendum)
[name]  09/23/2013                           YOUR PROCEDURE IS SCHEDULED ON: 09/25/13 at 10:00 am               [place]  SIGNS TO SHORT STAY CENTER                 ARRIVE AT SHORT STAY AT: 8:00 am               CALL THIS NUMBER IF ANY PROBLEMS THE DAY OF SURGERY :               [phone]                                REMEMBER:   Do not eat food or drink liquids AFTER MIDNIGHT                 Take these medicines the morning of surgery with               A SIPS OF WATER :   DILTIAZEM / LABETALOL      Do not wear jewelry, make-up   Do not wear lotions, powders, or perfumes.   Do not shave legs or underarms 12 hrs. before surgery (men may shave face)  Do not bring valuables to the hospital.  Contacts, dentures or bridgework may not be worn into surgery.  Leave suitcase in the car. After surgery it may be brought to your room.  For patients admitted to the hospital more than one night, checkout time is            11:00 AM                                                       The day of discharge.   Patients discharged the day of surgery will not be allowed to drive home.            If going home same day of surgery, must have someone stay with you              FIRST 24 hrs at home and arrange for some one to drive you              home from hospital.   ________________________________________________________________________                                                                        [place]  Before surgery, you can play an important role.  Because skin is not sterile, your skin needs to be as free of germs as possible.  You can reduce the number of germs on your skin  by washing with CHG (chlorahexidine gluconate) soap before surgery.  CHG is an antiseptic cleaner which kills germs and bonds with the skin to continue killing germs even after  washing. Please DO NOT use if you have an allergy to CHG or antibacterial soaps.  If your skin becomes reddened/irritated stop using the CHG and inform your nurse when you arrive at Short Stay. Do not shave (including legs and underarms) for at least 48 hours prior to the first CHG shower.  You may shave your face. Please follow these instructions carefully:   1.  Shower with CHG Soap the night before surgery and the  morning of Surgery.   2.  If you choose to wash your hair, wash your hair first as usual with your  normal  Shampoo.   3.  After you shampoo, rinse your hair and body thoroughly to remove the  shampoo.                                         4.  Use CHG as you would any other liquid soap.  You can apply chg directly  to the skin and wash . Gently wash with scrungie or clean wascloth    5.  Apply the CHG Soap to your body ONLY FROM THE NECK DOWN.   Do not use on open                           Wound or open sores. Avoid contact with eyes, ears mouth and genitals (private parts).                        Genitals (private parts) with your normal soap.              6.  Wash thoroughly, paying special attention to the area where your surgery  will be performed.   7.  Thoroughly rinse your body with warm water from the neck down.   8.  DO NOT shower/wash with your normal soap after using and rinsing off  the CHG Soap .                9.  Pat yourself dry with a clean towel.             10.  Wear clean pajamas.             11.  Place clean sheets on your bed the night of your first shower and do not  sleep with pets.  Day of Surgery : Do not apply any lotions/deodorants the morning of surgery.  Please wear clean clothes to the hospital/surgery center.  FAILURE TO FOLLOW THESE INSTRUCTIONS MAY RESULT IN THE CANCELLATION OF YOUR SURGERY    PATIENT  SIGNATURE_________________________________  ______________________________________________________________________     [name]  An incentive spirometer is a tool that can help keep your lungs clear and active. This tool measures how well you are filling your lungs with each breath. Taking long deep breaths may help reverse or decrease the chance of developing breathing (pulmonary) problems (especially infection) following:  A long period of time when you are unable to move or be active. BEFORE THE PROCEDURE   If the spirometer includes an indicator to show your best effort, your nurse or respiratory therapist will set it to a desired goal.  If possible, sit up straight or lean slightly forward. Try not to slouch.  Hold the incentive spirometer in an upright position. INSTRUCTIONS FOR USE  1. Sit on the edge of your bed if possible, or sit up as far as you can in bed or on a chair. 2. Hold the incentive spirometer in an upright position. 3. Breathe out normally. 4. Place the mouthpiece in your mouth and seal your lips tightly around it. 5. Breathe in slowly and as deeply as possible, raising the piston or the ball toward the top of the column. 6. Hold your breath for 3-5 seconds or for as long as possible. Allow the piston or ball to fall to the bottom of the column. 7. Remove the mouthpiece from your mouth and breathe out normally. 8. Rest for a few seconds and repeat Steps 1 through 7 at least 10 times every 1-2 hours when you are awake. Take your time and take a few normal breaths between deep breaths. 9. The spirometer may include an indicator to show your best effort. Use the indicator as a goal to work toward during each repetition. 10. After each set of 10 deep breaths, practice coughing to be sure your lungs are clear. If you have an incision (the cut made at the time of surgery), support your incision when coughing by placing a pillow or rolled up towels firmly  against it. Once you are able to get out of bed, walk around indoors and cough well. You may stop using the incentive spirometer when instructed by your caregiver.  RISKS AND COMPLICATIONS  Take your time so you do not get dizzy or light-headed.  If you are in pain, you may need to take or ask for pain medication before doing incentive spirometry. It is harder to take a deep breath if you are having pain. AFTER USE  Rest and breathe slowly and easily.  It can be helpful to keep track of a log of your progress. Your caregiver can provide you with a simple table to help with this. If you are using the spirometer at home, follow these instructions: [place] IF:   You are having difficultly using the spirometer.  You have trouble using the spirometer as often as instructed.  Your pain medication is not giving enough relief while using the spirometer.  You develop fever of 100.5 F (38.1 C) or higher. SEEK IMMEDIATE MEDICAL CARE IF:   You cough up bloody sputum that had not been present before.  You develop fever of 102 F (38.9 C) or greater.  You develop worsening pain at or near the incision site. MAKE SURE YOU:   Understand these instructions.  Will watch your condition.  Will get help right away if you are not doing well or get worse. Document Released: 07/24/2006 Document Revised: 06/05/2011 Document Reviewed: 09/24/2006 [facility] Patient Information 2014 [place], [place].   ________________________________________________________________________

## 2013-09-24 ENCOUNTER — Encounter (INDEPENDENT_AMBULATORY_CARE_PROVIDER_SITE_OTHER): Payer: Self-pay

## 2013-09-24 ENCOUNTER — Other Ambulatory Visit: Payer: Self-pay | Admitting: Orthopedic Surgery

## 2013-09-24 ENCOUNTER — Encounter (HOSPITAL_COMMUNITY)
Admission: RE | Admit: 2013-09-24 | Discharge: 2013-09-24 | Disposition: A | Discharge status: Home / Self Care | Payer: BC Managed Care – PPO | Source: Ambulatory Visit | Attending: Specialist | Admitting: Specialist

## 2013-09-24 ENCOUNTER — Encounter (HOSPITAL_COMMUNITY): Payer: Self-pay

## 2013-09-24 ENCOUNTER — Ambulatory Visit (HOSPITAL_COMMUNITY)
Admission: RE | Admit: 2013-09-24 | Discharge: 2013-09-24 | Disposition: A | Discharge status: Home / Self Care | Payer: BC Managed Care – PPO | Source: Ambulatory Visit | Attending: Anesthesiology | Admitting: Anesthesiology

## 2013-09-24 HISTORY — DX: Unspecified tear of unspecified meniscus, current injury, unspecified knee, initial encounter: S83.209A

## 2013-09-24 LAB — CBC
HCT: 40.4 % (ref 36.0–46.0)
HEMOGLOBIN: 13 g/dL (ref 12.0–15.0)
MCH: 30.5 pg (ref 26.0–34.0)
MCHC: 32.2 g/dL (ref 30.0–36.0)
MCV: 94.8 fL (ref 78.0–100.0)
Platelets: 322 10*3/uL (ref 150–400)
RBC: 4.26 MIL/uL (ref 3.87–5.11)
RDW: 14.1 % (ref 11.5–15.5)
WBC: 8 10*3/uL (ref 4.0–10.5)

## 2013-09-24 LAB — BASIC METABOLIC PANEL
Anion gap: 11 (ref 5–15)
BUN: 20 mg/dL (ref 6–23)
CO2: 28 mEq/L (ref 19–32)
Calcium: 9.5 mg/dL (ref 8.4–10.5)
Chloride: 103 mEq/L (ref 96–112)
Creatinine, Ser: 0.84 mg/dL (ref 0.50–1.10)
GFR calc Af Amer: 83 mL/min — ABNORMAL LOW (ref >90)
GFR, EST NON AFRICAN AMERICAN: 72 mL/min — AB (ref >90)
GLUCOSE: 80 mg/dL (ref 70–99)
POTASSIUM: 4 meq/L (ref 3.7–5.3)
Sodium: 142 mEq/L (ref 137–147)

## 2013-09-24 NOTE — H&P (Signed)
[name] is an 63 y.o. female.   Chief Complaint: L knee pain HPI: The patient is a 62 year old female who presents today for follow up of their knee. The patient is being followed for their left knee pain. They are now 9 1/2 months out from when symptoms began. Symptoms reported today include: pain, giving way and instability. and report their pain level to be mild. Current treatment includes: NSAIDs (Bayer prn). The following medication has been used for pain control: Hydrocodone (prn).The patient is working with restrictions of no pivoting, squatting, or kneeling, and should sit and stand as needed.  [name] follows up for her left knee, reports ongoing severe pain both with activity and with rest. She has been trying to limit activity as much as possible. She does sometimes have trouble sleeping at night due to pain. She is working with restrictions, she is a Sports coach. She also notes ongoing instability and swelling. She had no relief with prior steriod injection. She has a hinged brace at home.  She did not have any initial injury to the knee. She has never been told she has RA, CVA, sickle cell, lupus. She is a former smoker but is not currently smoking. She is not a heavy drinker. She does have high cholesterol.  Past Medical History  Diagnosis Date  . Hypertension   . Arthritis   . Heart murmur   . Meniscus tear     L KNEE    Past Surgical History  Procedure Laterality Date  . Knee surgery Right 1999  . Tubal ligation  1979  . Abdominal surgery  2010    for bowel blockage    Family History  Problem Relation Age of Onset  . Hypertension Mother   . Heart failure Father   . Hypertension Father    Social History:  reports that she quit smoking about 3 years ago. She does not have any smokeless tobacco history on file. She reports that she does not drink alcohol or use illicit drugs.  Allergies:  Allergies  Allergen Reactions  . Shrimp [Shellfish Allergy] Hives and  Itching  . Ace Inhibitors   . Latex Itching  . Penicillins Itching and Rash     (Not in a hospital admission)  Results for orders placed during the hospital encounter of 09/24/13 (from the past 48 hour(s))  CBC     Status: None   Collection Time    09/24/13 10:00 AM      Result Value Ref Range   WBC 8.0  4.0 - 10.5 K/uL   RBC 4.26  3.87 - 5.11 MIL/uL   Hemoglobin 13.0  12.0 - 15.0 g/dL   HCT 40.4  36.0 - 46.0 %   MCV 94.8  78.0 - 100.0 fL   MCH 30.5  26.0 - 34.0 pg   MCHC 32.2  30.0 - 36.0 g/dL   RDW 14.1  11.5 - 15.5 %   Platelets 322  150 - 400 K/uL  BASIC METABOLIC PANEL     Status: Abnormal   Collection Time    09/24/13 10:00 AM      Result Value Ref Range   Sodium 142  137 - 147 mEq/L   Potassium 4.0  3.7 - 5.3 mEq/L   Chloride 103  96 - 112 mEq/L   CO2 28  19 - 32 mEq/L   Glucose, Bld 80  70 - 99 mg/dL   BUN 20  6 - 23 mg/dL   Creatinine, Ser 0.84  0.50 - 1.10 mg/dL   Calcium 9.5  8.4 - 10.5 mg/dL   GFR calc non Af Amer 72 (*) >90 mL/min   GFR calc Af Amer 83 (*) >90 mL/min   Comment: (NOTE)     The eGFR has been calculated using the CKD EPI equation.     This calculation has not been validated in all clinical situations.     eGFR's persistently <90 mL/min signify possible Chronic Kidney     Disease.   Anion gap 11  5 - 15   Dg Chest 2 View  09/24/2013   CLINICAL DATA:  Preop knee arthroscopy  EXAM: CHEST  2 VIEW  COMPARISON:  07/06/2012  FINDINGS: The heart size and mediastinal contours are within normal limits. Both lungs are clear. The visualized skeletal structures are unremarkable.  IMPRESSION: No active cardiopulmonary disease.   Electronically Signed   By: Franchot Gallo M.D.   On: 09/24/2013 10:43    Review of Systems  Constitutional: Negative.   HENT: Negative.   Eyes: Negative.   Respiratory: Negative.   Cardiovascular: Negative.   Gastrointestinal: Negative.   Genitourinary: Negative.   Musculoskeletal: Positive for joint pain.  Skin: Negative.    Neurological: Negative.   Psychiatric/Behavioral: Negative.     There were no vitals taken for this visit. Physical Exam  Constitutional: She is oriented to person, place, and time. She appears well-developed and well-nourished.  HENT:  Head: Normocephalic and atraumatic.  Eyes: Conjunctivae and EOM are normal. Pupils are equal, round, and reactive to light.  Neck: Normal range of motion. Neck supple.  GI: Soft. Bowel sounds are normal.  Musculoskeletal:  Left Knee: Inspection and Palpation:Tenderness- medial knee tender to palpation (medial femoral condyle) and medial joint line tender to palpation. no tenderness to palpation of the superior calf, no tenderness to palpation of the pes anserine bursa, no tenderness to palpation of the quadriceps tendon, no tenderness to palpation of the patellar tendon, no tenderness to palpation of the patella, no tenderness to palpation of the lateral joint line, no tenderness to palpation of the fibular head and no tenderness to palpation of the peroneal nerve. Crepitus- mild. Patellar Tendon- no pain to palpation of the patellar tendon. Swelling- periarticular swelling present. Effusion- mild. Tissue tension/texture is - soft. Pulses- 2+. Sensation- intact to light touch. Skin- Color- no ecchymosis and no erythema. Strength and Tone:Quadriceps- 5/5. Hamstrings- 5/5. ROM: Flexion:AROM- 110 . Extension:AROM- 0 . Stability- Valgus Laxity at 30- None. Valgus Laxity at 0- None. Varus Laxity at 30- None. Varus Laxity at 0- None. Lachman- Negative. Anterior Drawer Test- Negative. Posterior Drawer Test- Negative. Deformities/Malalignments/Discrepancies- no deformities noted. Special Tests:McMurray Test (lateral) - negative. McMurray Test (medial)- painful. Patellar Compression Pain- mild pain.  Neurological: She is alert and oriented to person, place, and time. She has normal reflexes.  Skin: Skin is warm and dry.   Psychiatric: She has a normal mood and affect.    AP standing B/L knee xrays from 12/2012 with more notable valgus alignment right knee, nearly bone-on-bone R lateral compartment. Left knee mild tricompartmental DJD.  MRI L knee images and report reviewed today with extruded MMT, LMT, 1.9 x 1.5 cm area of AVN over the entire weightbearing surface medial femoral condyle with associated marrow edema. Moderate effusion. Baker's cyst.  Repeat standing xrays 07/2013 demonstrate moderate L knee medial joint space narrowing, with visible AVN lesion medial femoral condyle on AP view.   Assessment/Plan L knee MMT, LMT, DJD, AVN medial femoral condyle  Pt with ongoing L knee pain medial compartment x 11.5 months without injury, refractory to relative rest, activity modification, steroid injection, NSAIDs, pain medications, HEP, bracing, ice, elevation, with moderate DJD on xrays, meniscal tears, DJD and AVN medial femoral condyle noted on MRI and on updated xrays. Her AVN lesion covers the weightbearing surface of the medial femoral condyle and is causing her constant pain, also contributing to her instability along with her MMT. We discussed tx options. She does still have an open joint space, it has not fully collapsed and she does not have bone-on-bone changes. Given her ongoing symptoms and radiographic findings, would recommend proceeding with L knee arthroscopy, partial medial and lateral meniscectomies, debridement, and microfracture/drilling medial femoral condyle. Discussed the procedure itself as well as risks, complications and alternatives, including but not limited to DVT, PE, infx, bleeding, failure of procedure, need for secondary procedure, ongoing pain/symptoms, anesthesia risk, even stroke or death. Also discussed typical post-op protocols, time out of work, ice and elevation, HEP/quad strengthening, possible formal PT. Discussed need for DVT ppx post-op with ASA per protocol. We discussed the  possibility of worsening pain and stiffness post-operatively due to DJD and the possible need for future steroid injections, viscosupplementation, repeat arthroscopy, even TKA All questions were answered. Patient desires to proceed with surgery. We discussed restricted weightbearing for even up to 6-8 weeks post-op to allow this to heal as her AVN lesion covers the weightbearing surface of the medial femoral condyle. Recommend short term disability for work. In the interim, she will continue to work with restrictions. We discussed TKA if refractory. Will Rx Norco for pain, continue bracing and limiting her activity and weightbearing in the interim, ice and elevation for swelling. She has no hx of MRSA or DVT. PCN allergy rash only, no anaphylaxis. Will proceed accodingly and she will follow up 10-14 days post-op for suture removal. She will call with any questions or concerns in the interim.  Plan L knee arthroscopy, partial medial and lateral meniscectomies, debridement, microfracture/drilling  Markell Sciascia M. PA-C for Dr. Tonita Cong 09/24/2013, 9:02 PM

## 2013-09-25 ENCOUNTER — Encounter (HOSPITAL_COMMUNITY)
Admission: RE | Disposition: A | Discharge status: Home / Self Care | Payer: Self-pay | Source: Ambulatory Visit | Attending: Specialist

## 2013-09-25 ENCOUNTER — Ambulatory Visit (HOSPITAL_COMMUNITY): Payer: BC Managed Care – PPO | Admitting: *Deleted

## 2013-09-25 ENCOUNTER — Encounter (HOSPITAL_COMMUNITY): Payer: Self-pay | Admitting: *Deleted

## 2013-09-25 ENCOUNTER — Encounter (HOSPITAL_COMMUNITY): Payer: BC Managed Care – PPO | Admitting: *Deleted

## 2013-09-25 ENCOUNTER — Ambulatory Visit (HOSPITAL_COMMUNITY)
Admission: RE | Admit: 2013-09-25 | Discharge: 2013-09-25 | Disposition: A | Discharge status: Home / Self Care | Payer: BC Managed Care – PPO | Source: Ambulatory Visit | Attending: Specialist | Admitting: Specialist

## 2013-09-25 DIAGNOSIS — Z91040 Latex allergy status: Secondary | ICD-10-CM | POA: Insufficient documentation

## 2013-09-25 DIAGNOSIS — M899 Disorder of bone, unspecified: Secondary | ICD-10-CM | POA: Insufficient documentation

## 2013-09-25 DIAGNOSIS — M23302 Other meniscus derangements, unspecified lateral meniscus, unspecified knee: Secondary | ICD-10-CM | POA: Insufficient documentation

## 2013-09-25 DIAGNOSIS — M949 Disorder of cartilage, unspecified: Secondary | ICD-10-CM

## 2013-09-25 DIAGNOSIS — M659 Unspecified synovitis and tenosynovitis, unspecified site: Secondary | ICD-10-CM | POA: Insufficient documentation

## 2013-09-25 DIAGNOSIS — S83242A Other tear of medial meniscus, current injury, left knee, initial encounter: Secondary | ICD-10-CM

## 2013-09-25 DIAGNOSIS — Z79899 Other long term (current) drug therapy: Secondary | ICD-10-CM | POA: Insufficient documentation

## 2013-09-25 DIAGNOSIS — Z87891 Personal history of nicotine dependence: Secondary | ICD-10-CM | POA: Insufficient documentation

## 2013-09-25 DIAGNOSIS — M23305 Other meniscus derangements, unspecified medial meniscus, unspecified knee: Secondary | ICD-10-CM | POA: Insufficient documentation

## 2013-09-25 DIAGNOSIS — M1710 Unilateral primary osteoarthritis, unspecified knee: Secondary | ICD-10-CM | POA: Insufficient documentation

## 2013-09-25 DIAGNOSIS — M8708 Idiopathic aseptic necrosis of bone, other site: Secondary | ICD-10-CM | POA: Insufficient documentation

## 2013-09-25 DIAGNOSIS — I10 Essential (primary) hypertension: Secondary | ICD-10-CM | POA: Insufficient documentation

## 2013-09-25 DIAGNOSIS — Z880 Allergy status to penicillin: Secondary | ICD-10-CM | POA: Insufficient documentation

## 2013-09-25 HISTORY — PX: KNEE ARTHROSCOPY: SHX127

## 2013-09-25 SURGERY — ARTHROSCOPY, KNEE
Anesthesia: General | Site: Knee | Laterality: Left

## 2013-09-25 MED ORDER — MIDAZOLAM HCL 2 MG/2ML IJ SOLN
INTRAMUSCULAR | Status: AC
  Filled 2013-09-25: qty 2

## 2013-09-25 MED ORDER — CEFAZOLIN SODIUM-DEXTROSE 2-3 GM-% IV SOLR
INTRAVENOUS | Status: AC
  Filled 2013-09-25: qty 50

## 2013-09-25 MED ORDER — FENTANYL CITRATE 0.05 MG/ML IJ SOLN
INTRAMUSCULAR | Status: AC
  Filled 2013-09-25: qty 2

## 2013-09-25 MED ORDER — BUPIVACAINE-EPINEPHRINE (PF) 0.5% -1:200000 IJ SOLN
INTRAMUSCULAR | Status: AC
  Filled 2013-09-25: qty 30

## 2013-09-25 MED ORDER — DOCUSATE SODIUM 100 MG PO CAPS: 100.0000 mg | ORAL_CAPSULE | Freq: Two times a day (BID) | ORAL | Status: DC | PRN

## 2013-09-25 MED ORDER — MIDAZOLAM HCL 5 MG/5ML IJ SOLN
INTRAMUSCULAR | Status: DC | PRN
  Administered 2013-09-25 (×2): 1 mg via INTRAVENOUS

## 2013-09-25 MED ORDER — HYDRALAZINE HCL 20 MG/ML IJ SOLN
INTRAMUSCULAR | Status: DC | PRN
  Administered 2013-09-25: 5 mg via INTRAVENOUS

## 2013-09-25 MED ORDER — HYDRALAZINE HCL 20 MG/ML IJ SOLN
INTRAMUSCULAR | Status: AC
  Filled 2013-09-25: qty 1

## 2013-09-25 MED ORDER — KETOROLAC TROMETHAMINE 15 MG/ML IJ SOLN
INTRAMUSCULAR | Status: DC | PRN
  Administered 2013-09-25: 15 mg via INTRAVENOUS

## 2013-09-25 MED ORDER — BUPIVACAINE-EPINEPHRINE 0.5% -1:200000 IJ SOLN
INTRAMUSCULAR | Status: DC | PRN
  Administered 2013-09-25: 20 mL

## 2013-09-25 MED ORDER — LIDOCAINE HCL (CARDIAC) 20 MG/ML IV SOLN
INTRAVENOUS | Status: AC
  Filled 2013-09-25: qty 5

## 2013-09-25 MED ORDER — LIDOCAINE HCL 1 % IJ SOLN
INTRAMUSCULAR | Status: DC | PRN
  Administered 2013-09-25: 50 mg via INTRADERMAL

## 2013-09-25 MED ORDER — EPINEPHRINE HCL 1 MG/ML IJ SOLN
INTRAMUSCULAR | Status: AC
Start: 2013-09-25 — End: 2013-09-25
  Filled 2013-09-25: qty 2

## 2013-09-25 MED ORDER — EPINEPHRINE HCL 1 MG/ML IJ SOLN
INTRAMUSCULAR | Status: DC | PRN
  Administered 2013-09-25: 2 mL

## 2013-09-25 MED ORDER — LACTATED RINGERS IV SOLN
INTRAVENOUS | Status: DC
  Administered 2013-09-25: 1000 mL via INTRAVENOUS

## 2013-09-25 MED ORDER — METHOCARBAMOL 500 MG PO TABS: 500.0000 mg | ORAL_TABLET | Freq: Three times a day (TID) | ORAL | Status: DC | PRN

## 2013-09-25 MED ORDER — FENTANYL CITRATE 0.05 MG/ML IJ SOLN
INTRAMUSCULAR | Status: DC | PRN
  Administered 2013-09-25: 50 ug via INTRAVENOUS
  Administered 2013-09-25 (×2): 25 ug via INTRAVENOUS
  Administered 2013-09-25: 50 ug via INTRAVENOUS
  Administered 2013-09-25 (×2): 25 ug via INTRAVENOUS

## 2013-09-25 MED ORDER — ONDANSETRON HCL 4 MG/2ML IJ SOLN
INTRAMUSCULAR | Status: DC | PRN
  Administered 2013-09-25: 4 mg via INTRAVENOUS

## 2013-09-25 MED ORDER — PROPOFOL 10 MG/ML IV BOLUS
INTRAVENOUS | Status: AC
  Filled 2013-09-25: qty 20

## 2013-09-25 MED ORDER — CEFAZOLIN SODIUM-DEXTROSE 2-3 GM-% IV SOLR
2.0000 g | INTRAVENOUS | Status: AC
  Administered 2013-09-25: 2 g via INTRAVENOUS

## 2013-09-25 MED ORDER — LACTATED RINGERS IR SOLN
Status: DC | PRN
  Administered 2013-09-25: 6000 mL

## 2013-09-25 MED ORDER — PROPOFOL 10 MG/ML IV BOLUS
INTRAVENOUS | Status: DC | PRN
  Administered 2013-09-25: 180 mg via INTRAVENOUS

## 2013-09-25 MED ORDER — HYDROCODONE-ACETAMINOPHEN 7.5-325 MG PO TABS: 1.0000 | ORAL_TABLET | ORAL | Status: DC | PRN

## 2013-09-25 SURGICAL SUPPLY — 17 items
BLADE 4.2CUDA (BLADE) ×2 IMPLANT
BLADE CUDA SHAVER 3.5 (BLADE) ×3 IMPLANT
CLOTH 2% CHLOROHEXIDINE 3PK (PERSONAL CARE ITEMS) ×3 IMPLANT
DRSG EMULSION OIL 3X3 NADH (GAUZE/BANDAGES/DRESSINGS) ×3 IMPLANT
DURAPREP 26ML APPLICATOR (WOUND CARE) ×3 IMPLANT
GLOVE BIOGEL PI IND STRL 7.5 (GLOVE) IMPLANT
GLOVE BIOGEL PI INDICATOR 7.5 (GLOVE) ×2
GLOVE SURG SS PI 7.5 STRL IVOR (GLOVE) ×2 IMPLANT
GLOVE SURG SS PI 8.0 STRL IVOR (GLOVE) ×4 IMPLANT
GOWN STRL REUS W/TWL XL LVL3 (GOWN DISPOSABLE) ×5 IMPLANT
MANIFOLD NEPTUNE II (INSTRUMENTS) ×3 IMPLANT
PACK ARTHROSCOPY WL (CUSTOM PROCEDURE TRAY) ×3 IMPLANT
PADDING CAST COTTON 6X4 STRL (CAST SUPPLIES) ×3 IMPLANT
SET ARTHROSCOPY TUBING (MISCELLANEOUS) ×3
SET ARTHROSCOPY TUBING LN (MISCELLANEOUS) ×1 IMPLANT
SUT ETHILON 4 0 PS 2 18 (SUTURE) ×3 IMPLANT
WRAP KNEE MAXI GEL POST OP (GAUZE/BANDAGES/DRESSINGS) ×3 IMPLANT

## 2013-09-25 NOTE — Progress Notes (Signed)
Crutches given to patient for home use with instructions given on use by [facility]

## 2013-09-25 NOTE — Progress Notes (Signed)
Orthopedic Tech Progress Note Patient Details:  [name] 28-Oct-1950 [id number] Fit pt. for crutches.  Provided training on same. Ortho Devices Type of Ortho Device: Crutches   Darrol Poke 09/25/2013, 12:52 PM

## 2013-09-25 NOTE — Transfer of Care (Signed)
Immediate Anesthesia Transfer of Care Note  Patient: [name]  Procedure(s) Performed: Procedure(s): LEFT KNEE ARTHROSCOPY WITH PARTIAL MEDIAL AND LATERAL  MENISCECTOMY/DEBRIDEMENT/MICRO FRACTURE MEDIAL FEMORAL CONDYLE, REMOVAL OF OSTEOCONDRAL FRAGMENT (Left)  Patient Location: PACU  Anesthesia Type:General  Level of Consciousness: awake, alert , oriented and patient cooperative  Airway & Oxygen Therapy: Patient Spontanous Breathing and Patient connected to face mask oxygen  Post-op Assessment: Report given to PACU RN and Post -op Vital signs reviewed and stable  Post vital signs: Reviewed and stable  Complications: No apparent anesthesia complications

## 2013-09-25 NOTE — Brief Op Note (Signed)
09/25/2013  11:04 AM  PATIENT:  [name]  63 y.o. female  PRE-OPERATIVE DIAGNOSIS:  left knee medial mensical tear and degenerative joint disease  POST-OPERATIVE DIAGNOSIS:  left knee medial and lateral mensical tear and degenerative joint disease, avascular necrosis  PROCEDURE:  Procedure(s): LEFT KNEE ARTHROSCOPY WITH PARTIAL MEDIAL AND LATERAL  MENISCECTOMY/DEBRIDEMENT/MICRO FRACTURE MEDIAL FEMORAL CONDYLE, REMOVAL OF OSTEOCONDRAL FRAGMENT (Left)  SURGEON:  Surgeon(s) and Role:    * Johnn Hai, MD - Primary  PHYSICIAN ASSISTANT:   ASSISTANTS: Bissell   ANESTHESIA:   general  EBL:     BLOOD ADMINISTERED:none  DRAINS: none   LOCAL MEDICATIONS USED:  MARCAINE     SPECIMEN:  No Specimen  DISPOSITION OF SPECIMEN:  N/A  COUNTS:  YES  TOURNIQUET:  * No tourniquets in log *  DICTATION: .Other Dictation: Dictation Number [id number]  PLAN OF CARE: Discharge to home after PACU  PATIENT DISPOSITION:  PACU - hemodynamically stable.   Delay start of Pharmacological VTE agent (>24hrs) due to surgical blood loss or risk of bleeding: no

## 2013-09-25 NOTE — Discharge Instructions (Signed)
ARTHROSCOPIC KNEE SURGERY HOME CARE INSTRUCTIONS   PAIN You will be expected to have a moderate amount of pain in the affected knee for approximately two weeks.  However, the first two to four days will be the most severe in terms of the pain you will experience.  Prescriptions have been provided for you to take as needed for the pain.  The pain can be markedly reduced by using the ice/compressive bandage given.  Exchange the ice packs whenever they thaw.  During the night, keep the bandage on because it will still provide some compression for the swelling.  Also, keep the leg elevated on pillows above your heart, and this will help alleviate the pain and swelling.  MEDICATION Prescriptions have been provided to take as needed for pain. To prevent blood clots, take Aspirin 325mg  daily with a meal if not on a blood thinner and if no history of stomach ulcers.  ACTIVITY It is preferred that you stay on bedrest for approximately 24 hours.  However, you may go to the bathroom with help.  After this, you can start to be up and about progressively more.  Remember that the swelling may still increase after three to four days if you are up and doing too much.  You may put as much weight on the affected leg as pain will allow.  Use your crutches for comfort and safety.  However, as soon as you are able, you may discard the crutches and go without them.   DRESSING Keep the current dressing as dry as possible.  Two days after your surgery, you may remove the ice/compressive wrap, and surgical dressing.  You may now take a shower, but do not scrub the sounds directly with soap.  Let water rinse over these and gently wipe with your hand.  Reapply band-aids over the puncture wounds and more gauze if needed.  A slight amount of thin drainage can be normal at this time, and do not let it frighten you.  Reapply the ice/compressive wrap.  You may now repeat this every day each time you shower.  SYMPTOMS TO REPORT TO  YOUR DOCTOR  -Extreme pain.  -Extreme swelling.  -Temperature above 101 degrees that does not come down with acetaminophen     (Tylenol).  -Any changes in the feeling, color or movement of your toes.  -Extreme redness, heat, swelling or drainage at your incision  EXERCISE It is preferred that you begin to exercise on the day of your surgery.  Straight leg raises and short arc quads should be begun the afternoon or evening of surgery and continued until you come back for your follow-up appointment.   Attached is an instruction sheet on how to perform these two simple exercises.  Do these at least three times per day if not more.  You may bend your knee as much as is comfortable.  The puncture wounds may occasionally be slightly uncomfortable with bending of the knee.  Do not let this frighten you.  It is important to keep your knee motion, but do not overdo it.  If you have significant pain, simply do not bend the knee as far.   You will be given more exercises to perform at your first return visit.   Weight bear with knee immobilizer on as tolerated. May remove when sitting or lying down.   RETURN APPOINTMENT Please make an appointment to be seen by your doctor in 14 days from your surgery.  Patient Signature:  ________________________________________________________  Nurse's Signature:  ________________________________________________________

## 2013-09-25 NOTE — Anesthesia Preprocedure Evaluation (Signed)
Anesthesia Evaluation  Patient identified by MRN, date of birth, ID band Patient awake    Reviewed: Allergy & Precautions, H&P , NPO status , Patient's Chart, lab work & pertinent test results  Airway Mallampati: II TM Distance: >3 FB     Dental  (+) Dental Advisory Given   Pulmonary former smoker,  breath sounds clear to auscultation        Cardiovascular hypertension, Pt. on medications + Valvular Problems/Murmurs Rhythm:Regular Rate:Normal     Neuro/Psych negative neurological ROS  negative psych ROS   GI/Hepatic negative GI ROS, Neg liver ROS,   Endo/Other  negative endocrine ROS  Renal/GU negative Renal ROS     Musculoskeletal negative musculoskeletal ROS (+)   Abdominal   Peds  Hematology negative hematology ROS (+)   Anesthesia Other Findings   Reproductive/Obstetrics negative OB ROS                           Anesthesia Physical Anesthesia Plan  ASA: II  Anesthesia Plan: General   Post-op Pain Management:    Induction: Intravenous  Airway Management Planned: LMA  Additional Equipment:   Intra-op Plan:   Post-operative Plan: Extubation in OR  Informed Consent: I have reviewed the patients History and Physical, chart, labs and discussed the procedure including the risks, benefits and alternatives for the proposed anesthesia with the patient or authorized representative who has indicated his/her understanding and acceptance.   Dental advisory given  Plan Discussed with: CRNA  Anesthesia Plan Comments:         Anesthesia Quick Evaluation

## 2013-09-25 NOTE — Op Note (Signed)
NAME:  [name], [name] NO.:  [id number]  MEDICAL RECORD NO.:  [id number]  LOCATION:  [facility]                         FACILITY:  [facility]  PHYSICIAN:  Susa Day, M.D.    DATE OF BIRTH:  1950/07/04  DATE OF PROCEDURE:  09/25/2013 DATE OF DISCHARGE:  09/25/2013                              OPERATIVE REPORT   PREOPERATIVE DIAGNOSES: 1. Avascular necrosis medial femoral condyle. 2. Medial and lateral meniscus. 3. Osteochondral defect, medial femoral condyle.  POSTOPERATIVE DIAGNOSES: 1. Avascular necrosis medial femoral condyle. 2. Medial and lateral meniscus. 3. Osteochondral defect, medial femoral condyle. 4. Synovitis.  PROCEDURE PERFORMED: 1. Left knee arthroscopy. 2. Partial medial and lateral meniscectomies. 3. Removal of osteochondral fragment, medial femoral condyle. 4. Microfracture technique, medial femoral condyle. 5. Synovectomy medial suprapatellar pouch.  ANESTHESIA:  General.  ASSISTANT:  Cleophas Dunker, PA, who was needed to hold the leg in the valgus abducted position to gain access to the medial femoral condyle for excision of the osteochondral fragment and microfracture technique.  BRIEF HISTORY:  A 63 year old with knee pain, locking, popping, giving way.  We began by MRI, chondral fragment noted, indicated for excision. Meniscal tears were noted, indicated for partial meniscectomy.  We discussed risks and benefits including bleeding, infection, damage to neurovascular structures, no change in symptoms, worsening of symptoms, DVT, PE, anesthetic complications, and need for total knee arthroplasty, etc.  DESCRIPTION OF PROCEDURE:  The patient was in supine position, after the induction of adequate general anesthesia, 2 g Kefzol, left lower extremity was prepped, draped in usual sterile fashion.  A lateral parapatellar portal was fashioned with a #11 blade.  Ingress cannula atraumatically placed.  Irrigant was utilized to insufflate  the joint. Under direct visualization, a medial parapatellar portal was fashioned with a #11 blade after localization with 18-gauge needle sparing the medial meniscus.  Noted was a large defect medial femoral condyle and osteochondral fragment displaced hinged posteriorly this was on the posterior portion of the medial femoral condyle.  There was a complex tear of the medial meniscus.  Hypertrophic synovitis was noted. Meticulously excised, the osteochondral fragment removing the fragments with pituitary and a straight biter.  This is mainly from the posterior weightbearing surface.  Measuring 25 x 20 mm in length.  An approximately 3 mm in depth.  After excision of the osteochondral fragment and the edges to a good stable osteochondral architecture. There was a complex tear of the medial meniscus.  We excised with a straight basket, contoured with 3.5 Cuda and 4.2 Cuda shaver to a stable base.  Evacuated all loose fragments.  We then used the microfracture technique with a curved awl penetrating the osseous surface after shaving.  We took 3 fill 4 mm depth.  These were then spaced by 4-5 mm and we used anywhere from 15-20 of them.  With separate microfracture techniques, we actually got good bleeding bone with each microfracture and then we debrided the following that.  This was to a good stable base.  Again, this was on the posterior weightbearing surface.  We brought her knee into extension and the actual extension weightbearing surface was intact and the surface of the tibia showed some minor  grade 3 change with no grade 4 changes.  The ACL and PCL was unremarkable.  Lateral compartment revealed a radial tear lateral meniscus.  We debrided that and excised it to a stable base.  Approximately 50% of posterior third and middle third of the meniscus medially was excised and approximately 30% of the middle third of the lateral meniscus was excised.  The femoral condyle and tibial  plateau was unremarkable and there was no osteochondral defect.  Suprapatellar pouch revealed normal patellofemoral tracking.  There was hypertrophic synovium here and in the medial compartment.  We proceeded with extensive synovectomy for that purpose.  Next, revisited all compartments, no further pathology amenable to arthroscopic intervention; therefore, removed all instrumentation.  Portals were closed with 4-0 nylon simple sutures.  A 0.25% Marcaine with epinephrine was infiltrated in the joint.  Wound was dressed sterilely.  Awoken without difficulty, placed in a knee immobilizer, extubated, and transported to the recovery room in satisfactory condition.  The patient tolerated the procedure well.  No complications.  Again, Cleophas Dunker, PA she was utilized in access to the posterior medial aspect of the femoral condyle which was difficult and allowed access for the microfracture technique.     Susa Day, M.D.     Geralynn Rile  D:  09/25/2013  T:  09/25/2013  Job:  [id number]

## 2013-09-25 NOTE — Anesthesia Postprocedure Evaluation (Signed)
Anesthesia Post Note  Patient: [name]  Procedure(s) Performed: Procedure(s) (LRB): LEFT KNEE ARTHROSCOPY WITH PARTIAL MEDIAL AND LATERAL  MENISCECTOMY/DEBRIDEMENT/MICRO FRACTURE MEDIAL FEMORAL CONDYLE, REMOVAL OF OSTEOCONDRAL FRAGMENT (Left)  Anesthesia type: General  Patient location: PACU  Post pain: Pain level controlled  Post assessment: Post-op Vital signs reviewed  Last Vitals: BP 136/60  Pulse 55  Temp(Src) 36.3 C (Oral)  Resp 16  SpO2 100%  Post vital signs: Reviewed  Level of consciousness: sedated  Complications: No apparent anesthesia complications

## 2013-09-25 NOTE — Progress Notes (Signed)
Patient instructed to use immobilizer on leg when up

## 2013-09-25 NOTE — Interval H&P Note (Signed)
History and Physical Interval Note:  09/25/2013 7:23 AM  [name]  has presented today for surgery, with the diagnosis of left knee medial mensical tear and DJD  The various methods of treatment have been discussed with the patient and family. After consideration of risks, benefits and other options for treatment, the patient has consented to  Procedure(s): LEFT KNEE ARTHROSCOPY WITH PARTIAL LATERAL AND MEDIAL MENISECTOMY/DEBRIDEMENT/MICRO FRACTURE/DRILLING/MEDIAL FEMORAL CONDYLE (Left) as a surgical intervention .  The patient's history has been reviewed, patient examined, no change in status, stable for surgery.  I have reviewed the patient's chart and labs.  Questions were answered to the patient's satisfaction.     Rever Pichette C

## 2013-09-25 NOTE — H&P (View-Only) (Signed)
[name] is an 63 y.o. female.   Chief Complaint: L knee pain HPI: The patient is a 63 year old female who presents today for follow up of their knee. The patient is being followed for their left knee pain. They are now 9 1/2 months out from when symptoms began. Symptoms reported today include: pain, giving way and instability. and report their pain level to be mild. Current treatment includes: NSAIDs (Bayer prn). The following medication has been used for pain control: Hydrocodone (prn).The patient is working with restrictions of no pivoting, squatting, or kneeling, and should sit and stand as needed.  [name] follows up for her left knee, reports ongoing severe pain both with activity and with rest. [name] has been trying to limit activity as much as possible. [name] does sometimes have trouble sleeping at night due to pain. 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 [name] also notes ongoing instability and swelling. [name] had no relief with prior steriod injection. [name] has a hinged brace at home.  [name] did not have any initial injury to the knee. [name] has never been told [name] has RA, CVA, sickle cell, lupus. [name] is a former smoker but is not currently smoking. [name] is not a heavy drinker. [name] does have high cholesterol.  Past Medical History  Diagnosis Date  . Hypertension   . Arthritis   . Heart murmur   . Meniscus tear     L KNEE    Past Surgical History  Procedure Laterality Date  . Knee surgery Right 1999  . Tubal ligation  1979  . Abdominal surgery  2010    for bowel blockage    Family History  Problem Relation Age of Onset  . Hypertension Mother   . Heart failure Father   . Hypertension Father    Social History:  reports that [name] quit smoking about 3 years ago. [name] does not have any smokeless tobacco history on file. [name] reports that [name] does not drink alcohol or use illicit drugs.  Allergies:  Allergies  Allergen Reactions  . Shrimp [Shellfish Allergy] Hives and  Itching  . Ace Inhibitors   . Latex Itching  . Penicillins Itching and Rash     (Not in a hospital admission)  Results for orders placed during the hospital encounter of 09/24/13 (from the past 48 hour(s))  CBC     Status: None   Collection Time    09/24/13 10:00 AM      Result Value Ref Range   WBC 8.0  4.0 - 10.5 K/uL   RBC 4.26  3.87 - 5.11 MIL/uL   Hemoglobin 13.0  12.0 - 15.0 g/dL   HCT 40.4  36.0 - 46.0 %   MCV 94.8  78.0 - 100.0 fL   MCH 30.5  26.0 - 34.0 pg   MCHC 32.2  30.0 - 36.0 g/dL   RDW 14.1  11.5 - 15.5 %   Platelets 322  150 - 400 K/uL  BASIC METABOLIC PANEL     Status: Abnormal   Collection Time    09/24/13 10:00 AM      Result Value Ref Range   Sodium 142  137 - 147 mEq/L   Potassium 4.0  3.7 - 5.3 mEq/L   Chloride 103  96 - 112 mEq/L   CO2 28  19 - 32 mEq/L   Glucose, Bld 80  70 - 99 mg/dL   BUN 20  6 - 23 mg/dL   Creatinine, Ser 0.84  0.50 - 1.10 mg/dL   Calcium 9.5  8.4 - 10.5 mg/dL   GFR calc non Af Amer 72 (*) >90 mL/min   GFR calc Af Amer 83 (*) >90 mL/min   Comment: (NOTE)     The eGFR has been calculated using the CKD EPI equation.     This calculation has not been validated in all clinical situations.     eGFR's persistently <90 mL/min signify possible Chronic Kidney     Disease.   Anion gap 11  5 - 15   Dg Chest 2 View  09/24/2013   CLINICAL DATA:  Preop knee arthroscopy  EXAM: CHEST  2 VIEW  COMPARISON:  07/06/2012  FINDINGS: The heart size and mediastinal contours are within normal limits. Both lungs are clear. The visualized skeletal structures are unremarkable.  IMPRESSION: No active cardiopulmonary disease.   Electronically Signed   By: Franchot Gallo M.D.   On: 09/24/2013 10:43    Review of Systems  Constitutional: Negative.   HENT: Negative.   Eyes: Negative.   Respiratory: Negative.   Cardiovascular: Negative.   Gastrointestinal: Negative.   Genitourinary: Negative.   Musculoskeletal: Positive for joint pain.  Skin: Negative.    Neurological: Negative.   Psychiatric/Behavioral: Negative.     There were no vitals taken for this visit. Physical Exam  Constitutional: [name] is oriented to person, place, and time. [name] appears well-developed and well-nourished.  HENT:  Head: Normocephalic and atraumatic.  Eyes: Conjunctivae and EOM are normal. Pupils are equal, round, and reactive to light.  Neck: Normal range of motion. Neck supple.  GI: Soft. Bowel sounds are normal.  Musculoskeletal:  Left Knee: Inspection and Palpation:Tenderness- medial knee tender to palpation (medial femoral condyle) and medial joint line tender to palpation. no tenderness to palpation of the superior calf, no tenderness to palpation of the pes anserine bursa, no tenderness to palpation of the quadriceps tendon, no tenderness to palpation of the patellar tendon, no tenderness to palpation of the patella, no tenderness to palpation of the lateral joint line, no tenderness to palpation of the fibular head and no tenderness to palpation of the peroneal nerve. Crepitus- mild. Patellar Tendon- no pain to palpation of the patellar tendon. Swelling- periarticular swelling present. Effusion- mild. Tissue tension/texture is - soft. Pulses- 2+. Sensation- intact to light touch. Skin- Color- no ecchymosis and no erythema. Strength and Tone:Quadriceps- 5/5. Hamstrings- 5/5. ROM: Flexion:AROM- 110 . Extension:AROM- 0 . Stability- Valgus Laxity at 30- None. Valgus Laxity at 0- None. Varus Laxity at 30- None. Varus Laxity at 0- None. Lachman- Negative. Anterior Drawer Test- Negative. Posterior Drawer Test- Negative. Deformities/Malalignments/Discrepancies- no deformities noted. Special Tests:McMurray Test (lateral) - negative. McMurray Test (medial)- painful. Patellar Compression Pain- mild pain.  Neurological: [name] is alert and oriented to person, place, and time. [name] has normal reflexes.  Skin: Skin is warm and dry.   Psychiatric: [name] has a normal mood and affect.    AP standing B/L knee xrays from 12/2012 with more notable valgus alignment right knee, nearly bone-on-bone R lateral compartment. Left knee mild tricompartmental DJD.  MRI L knee images and report reviewed today with extruded MMT, LMT, 1.9 x 1.5 cm area of AVN over the entire weightbearing surface medial femoral condyle with associated marrow edema. Moderate effusion. Baker's cyst.  Repeat standing xrays 07/2013 demonstrate moderate L knee medial joint space narrowing, with visible AVN lesion medial femoral condyle on AP view.   Assessment/Plan L knee MMT, LMT, DJD, AVN medial femoral condyle  Pt with ongoing L knee pain medial compartment x 11.5 months without injury, refractory to relative rest, activity modification, steroid injection, NSAIDs, pain medications, HEP, bracing, ice, elevation, with moderate DJD on xrays, meniscal tears, DJD and AVN medial femoral condyle noted on MRI and on updated xrays. Her AVN lesion covers the weightbearing surface of the medial femoral condyle and is causing her constant pain, also contributing to her instability along with her MMT. We discussed tx options. [name] does still have an open joint space, it has not fully collapsed and [name] does not have bone-on-bone changes. Given her ongoing symptoms and radiographic findings, would recommend proceeding with L knee arthroscopy, partial medial and lateral meniscectomies, debridement, and microfracture/drilling medial femoral condyle. Discussed the procedure itself as well as risks, complications and alternatives, including but not limited to DVT, PE, infx, bleeding, failure of procedure, need for secondary procedure, ongoing pain/symptoms, anesthesia risk, even stroke or death. Also discussed typical post-op protocols, time out of work, ice and elevation, HEP/quad strengthening, possible formal PT. Discussed need for DVT ppx post-op with ASA per protocol. We discussed the  possibility of worsening pain and stiffness post-operatively due to DJD and the possible need for future steroid injections, viscosupplementation, repeat arthroscopy, even TKA All questions were answered. Patient desires to proceed with surgery. We discussed restricted weightbearing for even up to 6-8 weeks post-op to allow this to heal as her AVN lesion covers the weightbearing surface of the medial femoral condyle. Recommend short term disability for work. In the interim, [name] will continue to work with restrictions. We discussed TKA if refractory. Will Rx Norco for pain, continue bracing and limiting her activity and weightbearing in the interim, ice and elevation for swelling. [name] has no hx of MRSA or DVT. PCN allergy rash only, no anaphylaxis. Will proceed accodingly and [name] will follow up 10-14 days post-op for suture removal. [name] will call with any questions or concerns in the interim.  Plan L knee arthroscopy, partial medial and lateral meniscectomies, debridement, microfracture/drilling  Aileana Hodder M. PA-C for Dr. Tonita Cong 09/24/2013, 9:02 PM

## 2013-09-29 ENCOUNTER — Encounter (HOSPITAL_COMMUNITY): Payer: Self-pay | Admitting: Specialist

## 2013-10-21 ENCOUNTER — Emergency Department (HOSPITAL_COMMUNITY)
Admission: EM | Admit: 2013-10-21 | Discharge: 2013-10-21 | Disposition: A | Discharge status: Home / Self Care | Payer: BC Managed Care – PPO | Attending: Emergency Medicine | Admitting: Emergency Medicine

## 2013-10-21 ENCOUNTER — Encounter (HOSPITAL_COMMUNITY): Payer: Self-pay | Admitting: Emergency Medicine

## 2013-10-21 DIAGNOSIS — I10 Essential (primary) hypertension: Secondary | ICD-10-CM | POA: Insufficient documentation

## 2013-10-21 DIAGNOSIS — R011 Cardiac murmur, unspecified: Secondary | ICD-10-CM | POA: Insufficient documentation

## 2013-10-21 DIAGNOSIS — H5710 Ocular pain, unspecified eye: Secondary | ICD-10-CM | POA: Insufficient documentation

## 2013-10-21 DIAGNOSIS — Z91040 Latex allergy status: Secondary | ICD-10-CM | POA: Insufficient documentation

## 2013-10-21 DIAGNOSIS — Z87891 Personal history of nicotine dependence: Secondary | ICD-10-CM | POA: Insufficient documentation

## 2013-10-21 DIAGNOSIS — H00019 Hordeolum externum unspecified eye, unspecified eyelid: Secondary | ICD-10-CM | POA: Insufficient documentation

## 2013-10-21 DIAGNOSIS — M129 Arthropathy, unspecified: Secondary | ICD-10-CM | POA: Insufficient documentation

## 2013-10-21 DIAGNOSIS — Z79899 Other long term (current) drug therapy: Secondary | ICD-10-CM | POA: Insufficient documentation

## 2013-10-21 DIAGNOSIS — Z880 Allergy status to penicillin: Secondary | ICD-10-CM | POA: Insufficient documentation

## 2013-10-21 DIAGNOSIS — H00026 Hordeolum internum left eye, unspecified eyelid: Secondary | ICD-10-CM

## 2013-10-21 MED ORDER — POLYMYXIN B-TRIMETHOPRIM 10000-0.1 UNIT/ML-% OP SOLN: 1.0000 [drp] | OPHTHALMIC | Status: DC

## 2013-10-21 NOTE — Discharge Instructions (Signed)
Use antibiotic eyedrops until symptoms resolve. Refer to attached documents for more information.

## 2013-10-21 NOTE — ED Notes (Signed)
Reports waking up this am with pain and itching to left eye, denies injury to her eye and denies vision changes.

## 2013-10-21 NOTE — ED Provider Notes (Signed)
Medical screening examination/treatment/procedure(s) were performed by non-physician practitioner and as supervising physician I was immediately available for consultation/collaboration.   EKG Interpretation None        Hoy Morn, MD 10/21/13 1640

## 2013-10-21 NOTE — ED Notes (Signed)
PT denies injury to eye, but states she noticed edema and pain yesterday that persists today. Mild periorbital edema noted to L eye. Sclera and iris clear. Denies visual changes (blurred, double vision).

## 2013-10-21 NOTE — ED Provider Notes (Signed)
CSN: [id number]     Arrival date & time 10/21/13  1339 History  This chart was scribed for Alvina Chou PA-C working with Hoy Morn, MD by Stacy Gardner, ED scribe. This patient was seen in room TR04C/TR04C and the patient's care was started at 2:31 PM. First MD Initiated Contact with Patient 10/21/13 1357     Chief Complaint  Patient presents with  . Eye Pain     (Consider location/radiation/quality/duration/timing/severity/associated sxs/prior Treatment) Patient is a 63 y.o. female presenting with eye pain. The history is provided by the patient and medical records. No language interpreter was used.  Eye Pain   HPI Comments: [name] is a 63 y.o. female with hx of HTN who presents to the Emergency Department complaining of left eye pain after waking up this morning. She has the associated symptoms of eye itching.  Denies changes to vision. Denies injury or trauma to her eye. Denies vision changes.    Past Medical History  Diagnosis Date  . Hypertension   . Arthritis   . Heart murmur   . Meniscus tear     L KNEE   Past Surgical History  Procedure Laterality Date  . Knee surgery Right 1999  . Tubal ligation  1979  . Abdominal surgery  2010    for bowel blockage  . Knee arthroscopy Left 09/25/2013    Procedure: LEFT KNEE ARTHROSCOPY WITH PARTIAL MEDIAL AND LATERAL  MENISCECTOMY/DEBRIDEMENT/MICRO FRACTURE MEDIAL FEMORAL CONDYLE, REMOVAL OF OSTEOCONDRAL FRAGMENT;  Surgeon: Johnn Hai, MD;  Location: [facility];  Service: Orthopedics;  Laterality: Left;   Family History  Problem Relation Age of Onset  . Hypertension Mother   . Heart failure Father   . Hypertension Father    History  Substance Use Topics  . Smoking status: Former Smoker    Quit date: 07/29/2010  . Smokeless tobacco: Not on file  . Alcohol Use: No   OB History   Grav Para Term Preterm Abortions TAB SAB Ect Mult Living                 Review of Systems  Eyes: Positive for pain and  itching. Negative for visual disturbance.  All other systems reviewed and are negative.     Allergies  Shrimp; Ace inhibitors; Latex; and Penicillins  Home Medications   Prior to Admission medications   Medication Sig Start Date End Date Taking? Authorizing Provider  acetaminophen (TYLENOL) 500 MG tablet Take 1,000 mg by mouth every 6 (six) hours as needed for mild pain.    Historical Provider, MD  Aspirin-Caffeine (BAYER BACK & BODY PAIN EX ST) 500-32.5 MG TABS Take 1 tablet by mouth daily as needed (for pain).    Historical Provider, MD  diltiazem (DILACOR XR) 180 MG 24 hr capsule Take 180 mg by mouth every morning.     Historical Provider, MD  docusate sodium (COLACE) 100 MG capsule Take 1 capsule (100 mg total) by mouth 2 (two) times daily as needed for mild constipation. 09/25/13   Johnn Hai, MD  HYDROcodone-acetaminophen (NORCO) 7.5-325 MG per tablet Take 1 tablet by mouth every 4 (four) hours as needed. 09/25/13   Johnn Hai, MD  ibuprofen (ADVIL,MOTRIN) 200 MG tablet Take 400 mg by mouth every 6 (six) hours as needed for headache or mild pain.    Historical Provider, MD  labetalol (NORMODYNE) 200 MG tablet Take 200 mg by mouth 2 (two) times daily.     Historical Provider, MD  methocarbamol (  ROBAXIN) 500 MG tablet Take 1 tablet (500 mg total) by mouth 3 (three) times daily between meals as needed for muscle spasms. 09/25/13   Johnn Hai, MD  Multiple Vitamin (MULITIVITAMIN WITH MINERALS) TABS Take 1 tablet by mouth daily.    Historical Provider, MD   BP 150/64  Pulse 62  Temp(Src) 98.1 F (36.7 C) (Oral)  Resp 17  Ht 5\' 5"  (1.651 m)  Wt 155 lb (70.308 kg)  BMI 25.79 kg/m2  SpO2 97% Physical Exam  Nursing note and vitals reviewed. Constitutional: She is oriented to person, place, and time. She appears well-developed and well-nourished. No distress.  HENT:  Head: Normocephalic and atraumatic.  Left lower lid internal hordeolum. No drainage. There is tenderness to  palpation of the left lower lid.   Eyes: Conjunctivae and EOM are normal.  Neck: Normal range of motion.  Cardiovascular: Normal rate and regular rhythm.  Exam reveals no gallop and no friction rub.   No murmur heard. Pulmonary/Chest: Effort normal and breath sounds normal. She has no wheezes. She has no rales. She exhibits no tenderness.  Musculoskeletal: Normal range of motion.  Neurological: She is alert and oriented to person, place, and time. Coordination normal.  Speech is goal-oriented. Moves limbs without ataxia.   Skin: Skin is warm and dry.  Psychiatric: She has a normal mood and affect. Her behavior is normal.    ED Course  Procedures (including critical care time) DIAGNOSTIC STUDIES: Oxygen Saturation is 97% on room air, normal by my interpretation.    COORDINATION OF CARE:  2:30 PM Discussed course of care with pt . Pt understands and agrees.   Labs Review Labs Reviewed - No data to display  Imaging Review No results found.   EKG Interpretation None      MDM   Final diagnoses:  Hordeolum eyelid, internal, left    2:38 PM Patient will be discharged with polytrim eyedrops for hordeolum. Vitals stable and patient afebrile.   I personally performed the services described in this documentation, which was scribed in my presence. The recorded information has been reviewed and is accurate.    Alvina Chou, PA-C 10/21/13 1440

## 2013-11-11 ENCOUNTER — Encounter (HOSPITAL_COMMUNITY): Payer: Self-pay | Admitting: Emergency Medicine

## 2013-11-11 ENCOUNTER — Emergency Department (HOSPITAL_COMMUNITY)
Admission: EM | Admit: 2013-11-11 | Discharge: 2013-11-11 | Disposition: A | Discharge status: Home / Self Care | Payer: BC Managed Care – PPO | Attending: Emergency Medicine | Admitting: Emergency Medicine

## 2013-11-11 DIAGNOSIS — Z7982 Long term (current) use of aspirin: Secondary | ICD-10-CM | POA: Diagnosis not present

## 2013-11-11 DIAGNOSIS — M129 Arthropathy, unspecified: Secondary | ICD-10-CM | POA: Diagnosis not present

## 2013-11-11 DIAGNOSIS — Z87891 Personal history of nicotine dependence: Secondary | ICD-10-CM | POA: Insufficient documentation

## 2013-11-11 DIAGNOSIS — Z87828 Personal history of other (healed) physical injury and trauma: Secondary | ICD-10-CM | POA: Insufficient documentation

## 2013-11-11 DIAGNOSIS — Z79899 Other long term (current) drug therapy: Secondary | ICD-10-CM | POA: Insufficient documentation

## 2013-11-11 DIAGNOSIS — Z880 Allergy status to penicillin: Secondary | ICD-10-CM | POA: Diagnosis not present

## 2013-11-11 DIAGNOSIS — Z91040 Latex allergy status: Secondary | ICD-10-CM | POA: Insufficient documentation

## 2013-11-11 DIAGNOSIS — R011 Cardiac murmur, unspecified: Secondary | ICD-10-CM | POA: Diagnosis not present

## 2013-11-11 DIAGNOSIS — R1084 Generalized abdominal pain: Secondary | ICD-10-CM | POA: Diagnosis present

## 2013-11-11 DIAGNOSIS — I10 Essential (primary) hypertension: Secondary | ICD-10-CM | POA: Diagnosis not present

## 2013-11-11 LAB — URINALYSIS, ROUTINE W REFLEX MICROSCOPIC
Bilirubin Urine: NEGATIVE
GLUCOSE, UA: NEGATIVE mg/dL
HGB URINE DIPSTICK: NEGATIVE
KETONES UR: NEGATIVE mg/dL
Leukocytes, UA: NEGATIVE
Nitrite: NEGATIVE
PROTEIN: NEGATIVE mg/dL
Specific Gravity, Urine: 1.025 (ref 1.005–1.030)
UROBILINOGEN UA: 0.2 mg/dL (ref 0.0–1.0)
pH: 5.5 (ref 5.0–8.0)

## 2013-11-11 LAB — CBC WITH DIFFERENTIAL/PLATELET
Basophils Absolute: 0.1 10*3/uL (ref 0.0–0.1)
Basophils Relative: 1 % (ref 0–1)
EOS ABS: 0.3 10*3/uL (ref 0.0–0.7)
EOS PCT: 3 % (ref 0–5)
HEMATOCRIT: 37.6 % (ref 36.0–46.0)
HEMOGLOBIN: 12.6 g/dL (ref 12.0–15.0)
Lymphocytes Relative: 23 % (ref 12–46)
Lymphs Abs: 2.7 10*3/uL (ref 0.7–4.0)
MCH: 31.5 pg (ref 26.0–34.0)
MCHC: 33.5 g/dL (ref 30.0–36.0)
MCV: 94 fL (ref 78.0–100.0)
MONOS PCT: 6 % (ref 3–12)
Monocytes Absolute: 0.7 10*3/uL (ref 0.1–1.0)
Neutro Abs: 8.1 10*3/uL — ABNORMAL HIGH (ref 1.7–7.7)
Neutrophils Relative %: 67 % (ref 43–77)
Platelets: 296 10*3/uL (ref 150–400)
RBC: 4 MIL/uL (ref 3.87–5.11)
RDW: 14.1 % (ref 11.5–15.5)
WBC: 11.9 10*3/uL — ABNORMAL HIGH (ref 4.0–10.5)

## 2013-11-11 LAB — COMPREHENSIVE METABOLIC PANEL
ALK PHOS: 104 U/L (ref 39–117)
ALT: 30 U/L (ref 0–35)
AST: 31 U/L (ref 0–37)
Albumin: 4 g/dL (ref 3.5–5.2)
Anion gap: 14 (ref 5–15)
BUN: 17 mg/dL (ref 6–23)
CALCIUM: 9.9 mg/dL (ref 8.4–10.5)
CO2: 25 mEq/L (ref 19–32)
Chloride: 103 mEq/L (ref 96–112)
Creatinine, Ser: 0.68 mg/dL (ref 0.50–1.10)
GLUCOSE: 93 mg/dL (ref 70–99)
Potassium: 3.8 mEq/L (ref 3.7–5.3)
Sodium: 142 mEq/L (ref 137–147)
TOTAL PROTEIN: 8.1 g/dL (ref 6.0–8.3)
Total Bilirubin: 0.4 mg/dL (ref 0.3–1.2)

## 2013-11-11 LAB — I-STAT CG4 LACTIC ACID, ED: Lactic Acid, Venous: 0.81 mmol/L (ref 0.5–2.2)

## 2013-11-11 LAB — LIPASE, BLOOD: Lipase: 20 U/L (ref 11–59)

## 2013-11-11 MED ORDER — DICYCLOMINE HCL 20 MG PO TABS: 20.0000 mg | ORAL_TABLET | Freq: Two times a day (BID) | ORAL | Status: DC

## 2013-11-11 MED ORDER — OMEPRAZOLE 20 MG PO CPDR: DELAYED_RELEASE_CAPSULE | ORAL | Status: DC

## 2013-11-11 NOTE — ED Provider Notes (Signed)
CSN: [id number]     Arrival date & time 11/11/13  0533 History   First MD Initiated Contact with Patient 11/11/13 [phone]     Chief Complaint  Patient presents with  . Abdominal Pain     (Consider location/radiation/quality/duration/timing/severity/associated sxs/prior Treatment) HPI Comments: Patients with history of small bowel obstruction presents with complaint of left abdominal cramping that is been intermittent for the past several months. Pain occurs infrequently. This morning it was worse and it lasted several minutes before resolving completely. Patient did not have associated symptoms including fever, nausea, vomiting, diarrhea. Her last bowel movement was this morning and was normal without blood. No urinary symptoms other than increased frequency. She denies dysuria or hematuria. She states that she does not have a history of a colonoscopy. Patient is a former smoker without a significant history of PAD. Pain is not brought on by oral intake. No treatments prior to arrival. Onset of symptoms acute. Course is resolved. Nothing makes symptoms better or worse.  Patient is a 63 y.o. female presenting with abdominal pain. The history is provided by the patient and medical records.  Abdominal Pain Associated symptoms: no chest pain, no cough, no diarrhea, no dysuria, no fever, no nausea, no sore throat and no vomiting     Past Medical History  Diagnosis Date  . Hypertension   . Arthritis   . Heart murmur   . Meniscus tear     L KNEE   Past Surgical History  Procedure Laterality Date  . Knee surgery Right 1999  . Tubal ligation  1979  . Abdominal surgery  2010    for bowel blockage  . Knee arthroscopy Left 09/25/2013    Procedure: LEFT KNEE ARTHROSCOPY WITH PARTIAL MEDIAL AND LATERAL  MENISCECTOMY/DEBRIDEMENT/MICRO FRACTURE MEDIAL FEMORAL CONDYLE, REMOVAL OF OSTEOCONDRAL FRAGMENT;  Surgeon: Johnn Hai, MD;  Location: [facility];  Service: Orthopedics;  Laterality: Left;   Family  History  Problem Relation Age of Onset  . Hypertension Mother   . Heart failure Father   . Hypertension Father    History  Substance Use Topics  . Smoking status: Former Smoker    Quit date: 07/29/2010  . Smokeless tobacco: Never Used  . Alcohol Use: No   OB History   Grav Para Term Preterm Abortions TAB SAB Ect Mult Living                 Review of Systems  Constitutional: Negative for fever.  HENT: Negative for rhinorrhea and sore throat.   Eyes: Negative for redness.  Respiratory: Negative for cough.   Cardiovascular: Negative for chest pain.  Gastrointestinal: Positive for abdominal pain. Negative for nausea, vomiting and diarrhea.  Genitourinary: Negative for dysuria.  Musculoskeletal: Negative for myalgias.  Skin: Negative for rash.  Neurological: Negative for headaches.    Allergies  Shrimp; Ace inhibitors; Latex; and Penicillins  Home Medications   Prior to Admission medications   Medication Sig Start Date End Date Taking? Authorizing Provider  aspirin 325 MG EC tablet Take 325 mg by mouth daily.   Yes Historical Provider, MD  diltiazem (DILACOR XR) 180 MG 24 hr capsule Take 180 mg by mouth every morning.    Yes Historical Provider, MD  HYDROcodone-acetaminophen (NORCO) 7.5-325 MG per tablet Take 1 tablet by mouth every 6 (six) hours as needed for moderate pain.   Yes Historical Provider, MD  labetalol (NORMODYNE) 200 MG tablet Take 200 mg by mouth 2 (two) times daily.    Yes Historical  Provider, MD  Multiple Vitamin (MULITIVITAMIN WITH MINERALS) TABS Take 1 tablet by mouth daily.   Yes Historical Provider, MD  trimethoprim-polymyxin b (POLYTRIM) ophthalmic solution Place 1 drop into the left eye every 4 (four) hours. 10/21/13  Yes Kaitlyn Szekalski, PA-C   BP 175/82  Pulse 70  Temp(Src) 98 F (36.7 C) (Oral)  Resp 16  Ht 5\' 5"  (1.651 m)  Wt 150 lb (68.04 kg)  BMI 24.96 kg/m2  SpO2 100%  Physical Exam  Nursing note and vitals reviewed. Constitutional:  She appears well-developed and well-nourished.  HENT:  Head: Normocephalic and atraumatic.  Eyes: Conjunctivae are normal. Right eye exhibits no discharge. Left eye exhibits no discharge.  Neck: Normal range of motion. Neck supple.  Cardiovascular: Normal rate, regular rhythm and normal heart sounds.   Pulmonary/Chest: Effort normal and breath sounds normal.  Abdominal: Soft. There is no tenderness.  Neurological: She is alert.  Skin: Skin is warm and dry.  Psychiatric: She has a normal mood and affect.    ED Course  Procedures (including critical care time) Labs Review Labs Reviewed  CBC WITH DIFFERENTIAL - Abnormal; Notable for the following:    WBC 11.9 (*)    Neutro Abs 8.1 (*)    All other components within normal limits  COMPREHENSIVE METABOLIC PANEL  LIPASE, BLOOD  URINALYSIS, ROUTINE W REFLEX MICROSCOPIC  I-STAT CG4 LACTIC ACID, ED    Imaging Review No results found.   EKG Interpretation None      6:10 AM Patient seen and examined. Work-up initiated. Pt currently asymptomatic.    Vital signs reviewed and are as follows: BP 175/82  Pulse 70  Temp(Src) 98 F (36.7 C) (Oral)  Resp 16  Ht 5\' 5"  (1.651 m)  Wt 150 lb (68.04 kg)  BMI 24.96 kg/m2  SpO2 100%  8:27 AM Labs reviewed with patient. On re-exam, she continues to have no tenderness. Abdomen is soft. She has not had recurrence of pain while in ED.   Will d/c to home with trial of bentyl and omeprazole. She sees [facility] and can follow-up for recheck.   The patient was urged to return to the Emergency Department immediately with worsening of current symptoms, worsening abdominal pain, persistent vomiting, blood noted in stools, fever, or any other concerns. The patient verbalized understanding.    MDM   Final diagnoses:  Generalized abdominal pain   Patient with generalized abdominal cramping, resolved in ED. Abd is soft, non-tender here. Labs ordered and are reassuring. Mild leukocytosis is  only abnormal finding. Lactate ordered to screen for mesenteric ischemia and is neg. No focal right upper or right lower quadrant tenderness. Symptoms not consistent with SBO. Given benign exam and reassuring labs, do not feel as though imaging is indicated at this time. Very low suspicion for serious intraabdominal pathology.   No dangerous or life-threatening conditions suspected or identified by history, physical exam, and by work-up. No indications for hospitalization identified.     Carlisle Cater, PA-C 11/11/13 0830

## 2013-11-11 NOTE — Discharge Instructions (Signed)
Please read and follow all provided instructions.  Your diagnoses today include:  1. Generalized abdominal pain     Tests performed today include:  Blood counts and electrolytes  Blood tests to check liver and kidney function  Blood tests to check pancreas function  Urine test to look for infection and pregnancy (in women)  Vital signs. See below for your results today.   Medications prescribed:   Omeprazole (Prilosec) - stomach acid reducer  This medication can be found over-the-counter   Bentyl - medication for abdominal spasm  Take any prescribed medications only as directed.  Home care instructions:   Follow any educational materials contained in this packet.  Follow-up instructions: Please follow-up with your primary care provider in the next 2 days for further evaluation of your symptoms.    Return instructions:  SEEK IMMEDIATE MEDICAL ATTENTION IF:  The pain does not go away or becomes severe   A temperature above 101F develops   Repeated vomiting occurs (multiple episodes)   The pain becomes localized to portions of the abdomen. The right side could possibly be appendicitis. In an adult, the left lower portion of the abdomen could be colitis or diverticulitis.   Blood is being passed in stools or vomit (bright red or black tarry stools)   You develop chest pain, difficulty breathing, dizziness or fainting, or become confused, poorly responsive, or inconsolable (young children)  If you have any other emergent concerns regarding your health  Additional Information: Abdominal (belly) pain can be caused by many things. Your caregiver performed an examination and possibly ordered blood/urine tests and imaging (CT scan, x-rays, ultrasound). Many cases can be observed and treated at home after initial evaluation in the emergency department. Even though you are being discharged home, abdominal pain can be unpredictable. Therefore, you need a repeated exam if your  pain does not resolve, returns, or worsens. Most patients with abdominal pain don't have to be admitted to the hospital or have surgery, but serious problems like appendicitis and gallbladder attacks can start out as nonspecific pain. Many abdominal conditions cannot be diagnosed in one visit, so follow-up evaluations are very important.  Your vital signs today were: BP 158/69   Pulse 68   Temp(Src) 97.6 F (36.4 C) (Oral)   Resp 18   Ht 5\' 5"  (1.651 m)   Wt 150 lb (68.04 kg)   BMI 24.96 kg/m2   SpO2 99% If your blood pressure (bp) was elevated above 135/85 this visit, please have this repeated by your doctor within one month. --------------

## 2013-11-11 NOTE — ED Notes (Signed)
Bed: MP53 Expected date:  Expected time:  Means of arrival:  Comments: EMS/abdominal pain for 6 months-hypertensive

## 2013-11-11 NOTE — ED Notes (Signed)
Per EMS, pt has been having LLQ ABD pain for the last six months. Pt has been to her PCP and they have been unable to find any cause. Pt is "tired of the pain" and called EMS.

## 2013-11-15 NOTE — ED Provider Notes (Signed)
Medical screening examination/treatment/procedure(s) were performed by non-physician practitioner and as supervising physician I was immediately available for consultation/collaboration.   EKG Interpretation None       Varney Biles, MD 11/15/13 [phone]

## 2013-12-11 ENCOUNTER — Encounter: Payer: Self-pay | Admitting: Family Medicine

## 2013-12-11 ENCOUNTER — Ambulatory Visit: Payer: BC Managed Care – PPO | Attending: Family Medicine | Admitting: Family Medicine

## 2013-12-11 VITALS — BP 159/68 | HR 59 | Temp 98.0°F | Resp 16 | Ht 62.0 in | Wt 152.0 lb

## 2013-12-11 DIAGNOSIS — Z91199 Patient's noncompliance with other medical treatment and regimen due to unspecified reason: Secondary | ICD-10-CM | POA: Insufficient documentation

## 2013-12-11 DIAGNOSIS — Z7982 Long term (current) use of aspirin: Secondary | ICD-10-CM | POA: Insufficient documentation

## 2013-12-11 DIAGNOSIS — Z9119 Patient's noncompliance with other medical treatment and regimen: Secondary | ICD-10-CM | POA: Diagnosis not present

## 2013-12-11 DIAGNOSIS — I10 Essential (primary) hypertension: Secondary | ICD-10-CM | POA: Diagnosis not present

## 2013-12-11 MED ORDER — DILTIAZEM HCL ER COATED BEADS 180 MG PO CP24: 180.0000 mg | ORAL_CAPSULE | Freq: Every day | ORAL | Status: DC

## 2013-12-11 NOTE — Progress Notes (Signed)
   Subjective:    Patient ID: [name], female    DOB: 1951/02/16, 63 y.o.   MRN: [id number] CC: establish care, HTN  HPI  1. CHRONIC HYPERTENSION  Disease Monitoring  Blood pressure range: checks at drug store, cannot recall number   Chest pain: no   Dyspnea: no   Claudication: no   Medication compliance: yes, but out since 11/16/13  Medication Side Effects  Lightheadedness: no   Urinary frequency: no \  Edema: no   Impotence: no   Preventitive Healthcare:  Exercise: not now following knee surgery 7/15, previously walked 2 mi twice a week   Diet Pattern: 3 meals per day   Salt Restriction: yes, use Ms. Dash   Soc Hx: non smoker  Review of Systems As per HPI     Objective:   Physical Exam BP 159/68  Pulse 59  Temp(Src) 98 F (36.7 C) (Oral)  Resp 16  Ht 5\' 2"  (1.575 m)  Wt 152 lb (68.947 kg)  BMI 27.79 kg/m2  SpO2 99% General appearance: alert, cooperative and no distress Lungs: clear to auscultation bilaterally Heart: regular rate and rhythm and systolic murmur: early systolic 3/6, decrescendo at 2nd right intercostal space Extremities: extremities normal, atraumatic, no cyanosis or edema       Assessment & Plan:

## 2013-12-11 NOTE — Assessment & Plan Note (Signed)
A: BP above goal Meds: non compliant P: Restart diltiazem F/u in 3 months CMP

## 2013-12-11 NOTE — Patient Instructions (Signed)
Ms. Gettinger,  Thank you for coming in today. It was a pleasure meeting you. I look forward to be a primary doctor.  1. For high blood pressure: Restart diltiazem Return in 3 months for HTN f/u and physical   Dr. Adrian Blackwater

## 2013-12-12 LAB — COMPLETE METABOLIC PANEL WITH GFR
ALT: 15 U/L (ref 0–35)
AST: 19 U/L (ref 0–37)
Albumin: 4 g/dL (ref 3.5–5.2)
Alkaline Phosphatase: 91 U/L (ref 39–117)
BUN: 12 mg/dL (ref 6–23)
CALCIUM: 9.2 mg/dL (ref 8.4–10.5)
CHLORIDE: 102 meq/L (ref 96–112)
CO2: 28 meq/L (ref 19–32)
CREATININE: 0.73 mg/dL (ref 0.50–1.10)
GFR, EST NON AFRICAN AMERICAN: 87 mL/min
Glucose, Bld: 83 mg/dL (ref 70–99)
Potassium: 3.9 mEq/L (ref 3.5–5.3)
Sodium: 140 mEq/L (ref 135–145)
Total Bilirubin: 0.5 mg/dL (ref 0.2–1.2)
Total Protein: 6.9 g/dL (ref 6.0–8.3)

## 2013-12-17 ENCOUNTER — Other Ambulatory Visit: Payer: Self-pay

## 2013-12-17 DIAGNOSIS — Z1231 Encounter for screening mammogram for malignant neoplasm of breast: Secondary | ICD-10-CM

## 2013-12-18 ENCOUNTER — Telehealth: Payer: Self-pay | Admitting: *Deleted

## 2013-12-18 NOTE — Telephone Encounter (Signed)
Pt aware of lab results 

## 2013-12-18 NOTE — Telephone Encounter (Signed)
Message copied by Betti Cruz on Thu Dec 18, 2013  2:41 PM ------      Message from: Boykin Nearing      Created: Fri Dec 12, 2013  9:39 AM       Normal CMP      Continue current medication regimen ------

## 2014-01-02 ENCOUNTER — Ambulatory Visit
Admission: RE | Admit: 2014-01-02 | Discharge: 2014-01-02 | Disposition: A | Discharge status: Home / Self Care | Payer: Worker's Compensation | Source: Ambulatory Visit

## 2014-01-02 ENCOUNTER — Telehealth: Payer: Self-pay | Admitting: Family Medicine

## 2014-01-02 ENCOUNTER — Other Ambulatory Visit: Payer: Self-pay | Admitting: Family Medicine

## 2014-01-02 DIAGNOSIS — I10 Essential (primary) hypertension: Secondary | ICD-10-CM

## 2014-01-02 DIAGNOSIS — Z1231 Encounter for screening mammogram for malignant neoplasm of breast: Secondary | ICD-10-CM

## 2014-01-02 MED ORDER — LABETALOL HCL 100 MG PO TABS: 100.0000 mg | ORAL_TABLET | Freq: Two times a day (BID) | ORAL | Status: DC

## 2014-01-02 NOTE — Assessment & Plan Note (Signed)
A: patient came in today. Upset. Requesting labetalol which she has reportedly been on since 2014. Taking diltiazem. P: BP elevated today, pulse 83  Changed to labetalol. Dc cardizem RN f/u in 2 weeks if BP elevated and pulse can tolerate it, increase labetalol to 200 mg BID

## 2014-01-02 NOTE — Telephone Encounter (Signed)
Pt presented to [facility] pharmacy and tried to obtain refill for labetalol (NORMODYNE,TRANDATE) but was told that it is not part of her current med list by pharmacy.  Pt states this is a medication she takes regularly and requests a scripts.  Pt is present at the clinic and nurse was notified of pt's concern.

## 2014-01-08 DIAGNOSIS — M1990 Unspecified osteoarthritis, unspecified site: Secondary | ICD-10-CM | POA: Insufficient documentation

## 2014-01-08 DIAGNOSIS — E876 Hypokalemia: Secondary | ICD-10-CM | POA: Diagnosis not present

## 2014-01-08 DIAGNOSIS — Z880 Allergy status to penicillin: Secondary | ICD-10-CM | POA: Insufficient documentation

## 2014-01-08 DIAGNOSIS — Z87828 Personal history of other (healed) physical injury and trauma: Secondary | ICD-10-CM | POA: Diagnosis not present

## 2014-01-08 DIAGNOSIS — R51 Headache: Secondary | ICD-10-CM | POA: Insufficient documentation

## 2014-01-08 DIAGNOSIS — Z79899 Other long term (current) drug therapy: Secondary | ICD-10-CM | POA: Insufficient documentation

## 2014-01-08 DIAGNOSIS — K029 Dental caries, unspecified: Secondary | ICD-10-CM | POA: Diagnosis not present

## 2014-01-08 DIAGNOSIS — Z91040 Latex allergy status: Secondary | ICD-10-CM | POA: Diagnosis not present

## 2014-01-08 DIAGNOSIS — K088 Other specified disorders of teeth and supporting structures: Secondary | ICD-10-CM | POA: Insufficient documentation

## 2014-01-08 DIAGNOSIS — R011 Cardiac murmur, unspecified: Secondary | ICD-10-CM | POA: Diagnosis not present

## 2014-01-08 DIAGNOSIS — I10 Essential (primary) hypertension: Secondary | ICD-10-CM | POA: Insufficient documentation

## 2014-01-08 DIAGNOSIS — Z87891 Personal history of nicotine dependence: Secondary | ICD-10-CM | POA: Insufficient documentation

## 2014-01-09 ENCOUNTER — Emergency Department (HOSPITAL_COMMUNITY)
Admission: EM | Admit: 2014-01-09 | Discharge: 2014-01-09 | Disposition: A | Discharge status: Home / Self Care | Payer: BC Managed Care – PPO | Attending: Emergency Medicine | Admitting: Emergency Medicine

## 2014-01-09 ENCOUNTER — Encounter (HOSPITAL_COMMUNITY): Payer: Self-pay | Admitting: Emergency Medicine

## 2014-01-09 DIAGNOSIS — R519 Headache, unspecified: Secondary | ICD-10-CM

## 2014-01-09 DIAGNOSIS — E876 Hypokalemia: Secondary | ICD-10-CM

## 2014-01-09 DIAGNOSIS — K029 Dental caries, unspecified: Secondary | ICD-10-CM

## 2014-01-09 DIAGNOSIS — R51 Headache: Secondary | ICD-10-CM

## 2014-01-09 LAB — CBC WITH DIFFERENTIAL/PLATELET
Basophils Absolute: 0.1 10*3/uL (ref 0.0–0.1)
Basophils Relative: 1 % (ref 0–1)
EOS ABS: 0.3 10*3/uL (ref 0.0–0.7)
Eosinophils Relative: 3 % (ref 0–5)
HCT: 35.4 % — ABNORMAL LOW (ref 36.0–46.0)
HEMOGLOBIN: 11.3 g/dL — AB (ref 12.0–15.0)
LYMPHS ABS: 2.4 10*3/uL (ref 0.7–4.0)
LYMPHS PCT: 30 % (ref 12–46)
MCH: 30.3 pg (ref 26.0–34.0)
MCHC: 31.9 g/dL (ref 30.0–36.0)
MCV: 94.9 fL (ref 78.0–100.0)
MONOS PCT: 5 % (ref 3–12)
Monocytes Absolute: 0.4 10*3/uL (ref 0.1–1.0)
NEUTROS ABS: 4.9 10*3/uL (ref 1.7–7.7)
Neutrophils Relative %: 61 % (ref 43–77)
PLATELETS: 325 10*3/uL (ref 150–400)
RBC: 3.73 MIL/uL — ABNORMAL LOW (ref 3.87–5.11)
RDW: 13.7 % (ref 11.5–15.5)
WBC: 8.1 10*3/uL (ref 4.0–10.5)

## 2014-01-09 LAB — COMPREHENSIVE METABOLIC PANEL
ALK PHOS: 97 U/L (ref 39–117)
ALT: 17 U/L (ref 0–35)
AST: 21 U/L (ref 0–37)
Albumin: 3.5 g/dL (ref 3.5–5.2)
Anion gap: 11 (ref 5–15)
BILIRUBIN TOTAL: 0.2 mg/dL — AB (ref 0.3–1.2)
BUN: 12 mg/dL (ref 6–23)
CHLORIDE: 103 meq/L (ref 96–112)
CO2: 26 meq/L (ref 19–32)
Calcium: 9.4 mg/dL (ref 8.4–10.5)
Creatinine, Ser: 0.72 mg/dL (ref 0.50–1.10)
GFR calc non Af Amer: 89 mL/min — ABNORMAL LOW (ref >90)
GLUCOSE: 111 mg/dL — AB (ref 70–99)
POTASSIUM: 3.2 meq/L — AB (ref 3.7–5.3)
SODIUM: 140 meq/L (ref 137–147)
TOTAL PROTEIN: 7.3 g/dL (ref 6.0–8.3)

## 2014-01-09 MED ORDER — POTASSIUM CHLORIDE CRYS ER 20 MEQ PO TBCR: 20.0000 meq | EXTENDED_RELEASE_TABLET | Freq: Two times a day (BID) | ORAL | Status: DC

## 2014-01-09 MED ORDER — ERYTHROMYCIN BASE 500 MG PO TABS: 500.0000 mg | ORAL_TABLET | Freq: Four times a day (QID) | ORAL | Status: DC

## 2014-01-09 MED ORDER — SODIUM CHLORIDE 0.9 % IV SOLN
INTRAVENOUS | Status: DC
  Administered 2014-01-09: 02:00:00 via INTRAVENOUS

## 2014-01-09 MED ORDER — METOCLOPRAMIDE HCL 5 MG/ML IJ SOLN
10.0000 mg | Freq: Once | INTRAMUSCULAR | Status: AC
  Administered 2014-01-09: 10 mg via INTRAVENOUS
  Filled 2014-01-09: qty 2

## 2014-01-09 MED ORDER — DIPHENHYDRAMINE HCL 50 MG/ML IJ SOLN
25.0000 mg | Freq: Once | INTRAMUSCULAR | Status: AC
  Administered 2014-01-09: 25 mg via INTRAVENOUS
  Filled 2014-01-09: qty 1

## 2014-01-09 MED ORDER — POTASSIUM CHLORIDE CRYS ER 20 MEQ PO TBCR
40.0000 meq | EXTENDED_RELEASE_TABLET | Freq: Once | ORAL | Status: AC
  Administered 2014-01-09: 40 meq via ORAL
  Filled 2014-01-09: qty 2

## 2014-01-09 MED ORDER — TRAMADOL HCL 50 MG PO TABS: 50.0000 mg | ORAL_TABLET | Freq: Four times a day (QID) | ORAL | Status: DC | PRN

## 2014-01-09 NOTE — ED Notes (Signed)
Pt. reports elevated blood pressure  190/100  this evening with headache / sinus pressure and right lower dental pain .

## 2014-01-09 NOTE — Discharge Instructions (Signed)
Call the dentist office today to get an appointment to have your teeth evaluated. Take the antibiotics until gone. Call Dr Adrian Blackwater office today to clarify your blood pressure medications. Recheck as needed.

## 2014-01-09 NOTE — ED Provider Notes (Signed)
CSN: [id number]     Arrival date & time 01/08/14  2354 History   First MD Initiated Contact with Patient 01/09/14 0053     Chief Complaint  Patient presents with  . Hypertension  . Headache     (Consider location/radiation/quality/duration/timing/severity/associated sxs/prior Treatment) HPI Patient states this morning she started having a frontal headache that she describes as pressure without throbbing. She denies nausea, vomiting, or blurred vision. She denies any numbness or tingling of her extremities. She states she gets these headaches before and she relates it to tension although she denies being under any extra stress today. She states the last time she had the headache was 3 months ago.  Patient also states she has been having sneezing and a dry cough without fever. She denies sore throat or rhinorrhea. She states she went to the fire department today her blood pressure was 190/100. She states about one week ago she changed physicians and her labetalol was changed from 200 mg twice a day to 100 mg twice a day. She states she was told to stop the diltiazem.  PCP [facility] Dr Gwendel Hanson  Past Medical History  Diagnosis Date  . Heart murmur   . Meniscus tear     L KNEE  . Arthritis 2000  . Hypertension 2010   Past Surgical History  Procedure Laterality Date  . Knee surgery Right 1999  . Tubal ligation  1979  . Abdominal surgery  2010    for bowel blockage  . Knee arthroscopy Left 09/25/2013    Procedure: LEFT KNEE ARTHROSCOPY WITH PARTIAL MEDIAL AND LATERAL  MENISCECTOMY/DEBRIDEMENT/MICRO FRACTURE MEDIAL FEMORAL CONDYLE, REMOVAL OF OSTEOCONDRAL FRAGMENT;  Surgeon: Johnn Hai, MD;  Location: [facility];  Service: Orthopedics;  Laterality: Left;   Family History  Problem Relation Age of Onset  . Hypertension Mother   . Heart disease Mother     has a pacemaker   . Heart failure Father   . Hypertension Father   . Cancer Neg Hx    History  Substance Use  Topics  . Smoking status: Former Smoker    Quit date: 07/29/2010  . Smokeless tobacco: Never Used  . Alcohol Use: No   Lives at home   OB History   Grav Para Term Preterm Abortions TAB SAB Ect Mult Living                 Review of Systems  All other systems reviewed and are negative.     Allergies  Shrimp; Ace inhibitors; Latex; and Penicillins  Home Medications   Prior to Admission medications   Medication Sig Start Date End Date Taking? Authorizing Provider  aspirin 325 MG EC tablet Take 325 mg by mouth daily.   Yes Historical Provider, MD  labetalol (NORMODYNE) 100 MG tablet Take 1 tablet (100 mg total) by mouth 2 (two) times daily. 01/02/14  Yes Josalyn C Funches, MD  Multiple Vitamin (MULITIVITAMIN WITH MINERALS) TABS Take 1 tablet by mouth daily.   Yes Historical Provider, MD  erythromycin (E-MYCIN) 500 MG tablet Take 1 tablet (500 mg total) by mouth 4 (four) times daily. 01/09/14   Janice Norrie, MD  potassium chloride SA (K-DUR,KLOR-CON) 20 MEQ tablet Take 1 tablet (20 mEq total) by mouth 2 (two) times daily. 01/09/14   Janice Norrie, MD  traMADol (ULTRAM) 50 MG tablet Take 1 tablet (50 mg total) by mouth every 6 (six) hours as needed for moderate pain. 01/09/14   Janice Norrie,  MD   BP 164/70  Pulse 70  Temp(Src) 98.2 F (36.8 C) (Oral)  Resp 11  Ht 5\' 5"  (1.651 m)  Wt 150 lb (68.04 kg)  BMI 24.96 kg/m2  SpO2 100%  Vital signs normal except hypertension, at the time of my exam her blood pressure was 147/65  Physical Exam  Nursing note and vitals reviewed. Constitutional: She is oriented to person, place, and time. She appears well-developed and well-nourished.  Non-toxic appearance. She does not appear ill. No distress.  HENT:  Head: Normocephalic and atraumatic.  Right Ear: External ear normal.  Left Ear: External ear normal.  Nose: Nose normal. No mucosal edema or rhinorrhea.  Mouth/Throat: Mucous membranes are normal. No dental abscesses or uvula swelling.   Patient is noted to have multiple teeth that are rotted to the gumline. She indicates the ones on her right lower jaw near the incisors are the ones hurting today.  Eyes: Conjunctivae and EOM are normal. Pupils are equal, round, and reactive to light.  Neck: Normal range of motion and full passive range of motion without pain. Neck supple.  Cardiovascular: Normal rate, regular rhythm and normal heart sounds.  Exam reveals no gallop and no friction rub.   No murmur heard. Pulmonary/Chest: Effort normal and breath sounds normal. No respiratory distress. She has no wheezes. She has no rhonchi. She has no rales. She exhibits no tenderness and no crepitus.  Abdominal: Soft. Normal appearance and bowel sounds are normal. She exhibits no distension. There is no tenderness. There is no rebound and no guarding.  Musculoskeletal: Normal range of motion. She exhibits no edema and no tenderness.  Moves all extremities well.   Neurological: She is alert and oriented to person, place, and time. She has normal strength. No cranial nerve deficit.  Skin: Skin is warm, dry and intact. No rash noted. No erythema. No pallor.  Psychiatric: She has a normal mood and affect. Her speech is normal and behavior is normal. Her mood appears not anxious.    ED Course  Procedures (including critical care time)  Medications  0.9 %  sodium chloride infusion ( Intravenous New Bag/Given 01/09/14 0152)  metoCLOPramide (REGLAN) injection 10 mg (10 mg Intravenous Given 01/09/14 0153)  diphenhydrAMINE (BENADRYL) injection 25 mg (25 mg Intravenous Given 01/09/14 0153)  potassium chloride SA (K-DUR,KLOR-CON) CR tablet 40 mEq (40 mEq Oral Given 01/09/14 0152)    Review of patient's records shows Dr. Adrian Blackwater had told her to restart the diltiazem.There was no mention of the labetalol.  Recheck at discharge. Patient states her headache is gone. Patient is sleeping in no distress.  Labs Review Results for orders placed during the  hospital encounter of 01/09/14  CBC WITH DIFFERENTIAL      Result Value Ref Range   WBC 8.1  4.0 - 10.5 K/uL   RBC 3.73 (*) 3.87 - 5.11 MIL/uL   Hemoglobin 11.3 (*) 12.0 - 15.0 g/dL   HCT 35.4 (*) 36.0 - 46.0 %   MCV 94.9  78.0 - 100.0 fL   MCH 30.3  26.0 - 34.0 pg   MCHC 31.9  30.0 - 36.0 g/dL   RDW 13.7  11.5 - 15.5 %   Platelets 325  150 - 400 K/uL   Neutrophils Relative % 61  43 - 77 %   Neutro Abs 4.9  1.7 - 7.7 K/uL   Lymphocytes Relative 30  12 - 46 %   Lymphs Abs 2.4  0.7 - 4.0 K/uL   Monocytes  Relative 5  3 - 12 %   Monocytes Absolute 0.4  0.1 - 1.0 K/uL   Eosinophils Relative 3  0 - 5 %   Eosinophils Absolute 0.3  0.0 - 0.7 K/uL   Basophils Relative 1  0 - 1 %   Basophils Absolute 0.1  0.0 - 0.1 K/uL  COMPREHENSIVE METABOLIC PANEL      Result Value Ref Range   Sodium 140  137 - 147 mEq/L   Potassium 3.2 (*) 3.7 - 5.3 mEq/L   Chloride 103  96 - 112 mEq/L   CO2 26  19 - 32 mEq/L   Glucose, Bld 111 (*) 70 - 99 mg/dL   BUN 12  6 - 23 mg/dL   Creatinine, Ser 0.72  0.50 - 1.10 mg/dL   Calcium 9.4  8.4 - 10.5 mg/dL   Total Protein 7.3  6.0 - 8.3 g/dL   Albumin 3.5  3.5 - 5.2 g/dL   AST 21  0 - 37 U/L   ALT 17  0 - 35 U/L   Alkaline Phosphatase 97  39 - 117 U/L   Total Bilirubin 0.2 (*) 0.3 - 1.2 mg/dL   GFR calc non Af Amer 89 (*) >90 mL/min   GFR calc Af Amer >90  >90 mL/min   Anion gap 11  5 - 15   Laboratory interpretation all normal except hypokalemia     Imaging Review No results found.   EKG Interpretation None      MDM   Final diagnoses:  Headache, unspecified headache type  Hypokalemia  Pain due to dental caries    New Prescriptions   ERYTHROMYCIN (E-MYCIN) 500 MG TABLET    Take 1 tablet (500 mg total) by mouth 4 (four) times daily.   POTASSIUM CHLORIDE SA (K-DUR,KLOR-CON) 20 MEQ TABLET    Take 1 tablet (20 mEq total) by mouth 2 (two) times daily.   TRAMADOL (ULTRAM) 50 MG TABLET    Take 1 tablet (50 mg total) by mouth every 6 (six) hours  as needed for moderate pain.    Plan discharge   Rolland Porter, MD, Alanson Aly, MD 01/09/14 [id number]

## 2014-01-12 ENCOUNTER — Emergency Department (HOSPITAL_COMMUNITY)
Admission: EM | Admit: 2014-01-12 | Discharge: 2014-01-13 | Disposition: A | Discharge status: Home / Self Care | Payer: BC Managed Care – PPO | Attending: Emergency Medicine | Admitting: Emergency Medicine

## 2014-01-12 ENCOUNTER — Encounter (HOSPITAL_COMMUNITY): Payer: Self-pay | Admitting: Emergency Medicine

## 2014-01-12 ENCOUNTER — Telehealth: Payer: Self-pay | Admitting: *Deleted

## 2014-01-12 DIAGNOSIS — Z880 Allergy status to penicillin: Secondary | ICD-10-CM | POA: Diagnosis not present

## 2014-01-12 DIAGNOSIS — R109 Unspecified abdominal pain: Secondary | ICD-10-CM | POA: Diagnosis present

## 2014-01-12 DIAGNOSIS — Z9851 Tubal ligation status: Secondary | ICD-10-CM | POA: Insufficient documentation

## 2014-01-12 DIAGNOSIS — Z792 Long term (current) use of antibiotics: Secondary | ICD-10-CM | POA: Insufficient documentation

## 2014-01-12 DIAGNOSIS — Z79899 Other long term (current) drug therapy: Secondary | ICD-10-CM | POA: Insufficient documentation

## 2014-01-12 DIAGNOSIS — M1990 Unspecified osteoarthritis, unspecified site: Secondary | ICD-10-CM | POA: Diagnosis not present

## 2014-01-12 DIAGNOSIS — Z9889 Other specified postprocedural states: Secondary | ICD-10-CM | POA: Diagnosis not present

## 2014-01-12 DIAGNOSIS — Z87891 Personal history of nicotine dependence: Secondary | ICD-10-CM | POA: Diagnosis not present

## 2014-01-12 DIAGNOSIS — I10 Essential (primary) hypertension: Secondary | ICD-10-CM | POA: Diagnosis not present

## 2014-01-12 DIAGNOSIS — R011 Cardiac murmur, unspecified: Secondary | ICD-10-CM | POA: Diagnosis not present

## 2014-01-12 LAB — CBC WITH DIFFERENTIAL/PLATELET
Basophils Absolute: 0.1 10*3/uL (ref 0.0–0.1)
Basophils Relative: 1 % (ref 0–1)
EOS ABS: 0.3 10*3/uL (ref 0.0–0.7)
Eosinophils Relative: 3 % (ref 0–5)
HEMATOCRIT: 37 % (ref 36.0–46.0)
HEMOGLOBIN: 12.4 g/dL (ref 12.0–15.0)
Lymphocytes Relative: 30 % (ref 12–46)
Lymphs Abs: 2.6 10*3/uL (ref 0.7–4.0)
MCH: 31.3 pg (ref 26.0–34.0)
MCHC: 33.5 g/dL (ref 30.0–36.0)
MCV: 93.4 fL (ref 78.0–100.0)
MONO ABS: 0.4 10*3/uL (ref 0.1–1.0)
MONOS PCT: 5 % (ref 3–12)
NEUTROS PCT: 61 % (ref 43–77)
Neutro Abs: 5.5 10*3/uL (ref 1.7–7.7)
Platelets: 364 10*3/uL (ref 150–400)
RBC: 3.96 MIL/uL (ref 3.87–5.11)
RDW: 13.5 % (ref 11.5–15.5)
WBC: 8.8 10*3/uL (ref 4.0–10.5)

## 2014-01-12 LAB — URINALYSIS, ROUTINE W REFLEX MICROSCOPIC
Bilirubin Urine: NEGATIVE
Glucose, UA: NEGATIVE mg/dL
Hgb urine dipstick: NEGATIVE
Ketones, ur: NEGATIVE mg/dL
LEUKOCYTES UA: NEGATIVE
NITRITE: NEGATIVE
Protein, ur: NEGATIVE mg/dL
SPECIFIC GRAVITY, URINE: 1.015 (ref 1.005–1.030)
Urobilinogen, UA: 0.2 mg/dL (ref 0.0–1.0)
pH: 6 (ref 5.0–8.0)

## 2014-01-12 LAB — COMPREHENSIVE METABOLIC PANEL
ALBUMIN: 3.8 g/dL (ref 3.5–5.2)
ALT: 16 U/L (ref 0–35)
ANION GAP: 13 (ref 5–15)
AST: 22 U/L (ref 0–37)
Alkaline Phosphatase: 97 U/L (ref 39–117)
BUN: 11 mg/dL (ref 6–23)
CO2: 27 mEq/L (ref 19–32)
Calcium: 9.4 mg/dL (ref 8.4–10.5)
Chloride: 100 mEq/L (ref 96–112)
Creatinine, Ser: 0.71 mg/dL (ref 0.50–1.10)
GFR calc Af Amer: >90 mL/min (ref >90)
GFR calc non Af Amer: 89 mL/min — ABNORMAL LOW (ref >90)
Glucose, Bld: 88 mg/dL (ref 70–99)
Potassium: 3.7 mEq/L (ref 3.7–5.3)
Sodium: 140 mEq/L (ref 137–147)
TOTAL PROTEIN: 8 g/dL (ref 6.0–8.3)
Total Bilirubin: 0.2 mg/dL — ABNORMAL LOW (ref 0.3–1.2)

## 2014-01-12 MED ORDER — IOHEXOL 300 MG/ML  SOLN
20.0000 mL | INTRAMUSCULAR | Status: AC
  Administered 2014-01-12: 20 mL via ORAL

## 2014-01-12 MED ORDER — FENTANYL CITRATE 0.05 MG/ML IJ SOLN
50.0000 ug | Freq: Once | INTRAMUSCULAR | Status: AC
  Administered 2014-01-12: 50 ug via INTRAVENOUS
  Filled 2014-01-12: qty 2

## 2014-01-12 MED ORDER — ONDANSETRON HCL 4 MG/2ML IJ SOLN
4.0000 mg | Freq: Once | INTRAMUSCULAR | Status: AC
  Administered 2014-01-12: 4 mg via INTRAVENOUS
  Filled 2014-01-12: qty 2

## 2014-01-12 MED ORDER — SODIUM CHLORIDE 0.9 % IV BOLUS (SEPSIS)
500.0000 mL | Freq: Once | INTRAVENOUS | Status: AC
  Administered 2014-01-12: 500 mL via INTRAVENOUS

## 2014-01-12 NOTE — ED Notes (Signed)
Pt to ED with c/o lower abd pain onset after eating at [facility] earlier today.  Also c/o nausea without vomiting.   Pt st's she has had lower back pain x's 2 days and thinks she may have UTI as well

## 2014-01-12 NOTE — Telephone Encounter (Signed)
Pt aware of mammogram results 

## 2014-01-12 NOTE — ED Provider Notes (Signed)
CSN: [id number]     Arrival date & time 01/12/14  1910 History   First MD Initiated Contact with Patient 01/12/14 2200     Chief Complaint  Patient presents with  . Abdominal Pain     (Consider location/radiation/quality/duration/timing/severity/associated sxs/prior Treatment) Patient is a 63 y.o. female presenting with abdominal pain. The history is provided by the patient.  Abdominal Pain Associated symptoms: nausea   Associated symptoms: no chest pain, no diarrhea, no shortness of breath and no vomiting    patient's had lower abdominal pain for the last few days. It is dull and crampy. She states she's had some urinary frequency. She's had nausea without vomiting. No diarrhea or constipation. No fevers. She states she's had some pain in her back also. No trauma.  Past Medical History  Diagnosis Date  . Heart murmur   . Meniscus tear     L KNEE  . Arthritis 2000  . Hypertension 2010   Past Surgical History  Procedure Laterality Date  . Knee surgery Right 1999  . Tubal ligation  1979  . Abdominal surgery  2010    for bowel blockage  . Knee arthroscopy Left 09/25/2013    Procedure: LEFT KNEE ARTHROSCOPY WITH PARTIAL MEDIAL AND LATERAL  MENISCECTOMY/DEBRIDEMENT/MICRO FRACTURE MEDIAL FEMORAL CONDYLE, REMOVAL OF OSTEOCONDRAL FRAGMENT;  Surgeon: Johnn Hai, MD;  Location: [facility];  Service: Orthopedics;  Laterality: Left;   Family History  Problem Relation Age of Onset  . Hypertension Mother   . Heart disease Mother     has a pacemaker   . Heart failure Father   . Hypertension Father   . Cancer Neg Hx    History  Substance Use Topics  . Smoking status: Former Smoker    Quit date: 07/29/2010  . Smokeless tobacco: Never Used  . Alcohol Use: No   OB History   Grav Para Term Preterm Abortions TAB SAB Ect Mult Living                 Review of Systems  Constitutional: Negative for activity change and appetite change.  Eyes: Negative for pain.  Respiratory: Negative  for chest tightness and shortness of breath.   Cardiovascular: Negative for chest pain and leg swelling.  Gastrointestinal: Positive for nausea and abdominal pain. Negative for vomiting and diarrhea.  Genitourinary: Positive for frequency. Negative for flank pain.  Musculoskeletal: Negative for back pain and neck stiffness.  Skin: Negative for rash.  Neurological: Negative for weakness, numbness and headaches.  Psychiatric/Behavioral: Negative for behavioral problems.      Allergies  Shrimp; Ace inhibitors; Latex; and Penicillins  Home Medications   Prior to Admission medications   Medication Sig Start Date End Date Taking? Authorizing Provider  aspirin 325 MG EC tablet Take 325 mg by mouth daily.   Yes Historical Provider, MD  labetalol (NORMODYNE) 100 MG tablet Take 1 tablet (100 mg total) by mouth 2 (two) times daily. 01/02/14  Yes Josalyn C Funches, MD  Multiple Vitamin (MULITIVITAMIN WITH MINERALS) TABS Take 1 tablet by mouth daily.   Yes Historical Provider, MD  potassium chloride SA (K-DUR,KLOR-CON) 20 MEQ tablet Take 1 tablet (20 mEq total) by mouth 2 (two) times daily. 01/09/14  Yes Janice Norrie, MD  erythromycin (E-MYCIN) 500 MG tablet Take 1 tablet (500 mg total) by mouth 4 (four) times daily. 01/09/14   Janice Norrie, MD  traMADol (ULTRAM) 50 MG tablet Take 1 tablet (50 mg total) by mouth every 6 (six) hours  as needed for moderate pain. 01/09/14   Janice Norrie, MD   BP 172/77  Pulse 65  Temp(Src) 97.9 F (36.6 C) (Oral)  Resp 22  Ht 5\' 5"  (1.651 m)  Wt 153 lb 7 oz (69.599 kg)  BMI 25.53 kg/m2  SpO2 100% Physical Exam  Nursing note and vitals reviewed. Constitutional: She is oriented to person, place, and time. She appears well-developed and well-nourished.  HENT:  Head: Normocephalic and atraumatic.  Eyes: EOM are normal. Pupils are equal, round, and reactive to light.  Cardiovascular: Normal rate, regular rhythm and normal heart sounds.   No murmur  heard. Pulmonary/Chest: Effort normal and breath sounds normal. No respiratory distress. She has no wheezes. She has no rales.  Abdominal: Soft. She exhibits no distension. There is tenderness. There is no rebound and no guarding.  Tenderness in right lower quadrant. No rebound or guarding. No hernias palpated  Musculoskeletal: Normal range of motion.  Neurological: She is alert and oriented to person, place, and time. No cranial nerve deficit.  Skin: Skin is warm and dry.  Psychiatric: She has a normal mood and affect. Her speech is normal.    ED Course  Procedures (including critical care time) Labs Review Labs Reviewed  COMPREHENSIVE METABOLIC PANEL - Abnormal; Notable for the following:    Total Bilirubin 0.2 (*)    GFR calc non Af Amer 89 (*)    All other components within normal limits  CBC WITH DIFFERENTIAL  URINALYSIS, ROUTINE W REFLEX MICROSCOPIC    Imaging Review No results found.   EKG Interpretation None      MDM   Final diagnoses:  None    Patient with right lower quadrant abdominal pain. Lab work reassuring. No UTI. Due to tenderness will get CT scan. If negative can likely discharge home.    Jasper Riling. Alvino Chapel, MD 01/12/14 2350

## 2014-01-13 ENCOUNTER — Encounter (HOSPITAL_COMMUNITY): Payer: Self-pay

## 2014-01-13 ENCOUNTER — Emergency Department (HOSPITAL_COMMUNITY): Payer: BC Managed Care – PPO

## 2014-01-13 MED ORDER — ONDANSETRON 4 MG PO TBDP: 4.0000 mg | ORAL_TABLET | Freq: Three times a day (TID) | ORAL | Status: DC | PRN

## 2014-01-13 MED ORDER — IOHEXOL 300 MG/ML  SOLN
100.0000 mL | Freq: Once | INTRAMUSCULAR | Status: AC | PRN
  Administered 2014-01-13: 100 mL via INTRAVENOUS

## 2014-01-13 MED ORDER — HYDROCODONE-ACETAMINOPHEN 5-325 MG PO TABS: 1.0000 | ORAL_TABLET | Freq: Two times a day (BID) | ORAL | Status: DC | PRN

## 2014-01-13 NOTE — ED Notes (Signed)
MD at bedside. 

## 2014-01-13 NOTE — Discharge Instructions (Signed)
Abdominal Pain Ms. Toppins, you were seen today for abdominal pain. Your laboratory studies and CT scan did not reveal a cause for your abdominal pain. Followup with your regular Dr. within 3 days for continued treatment. If any of your symptoms worsen come back to the emergency department immediately for repeat evaluation. Thank you. Many things can cause belly (abdominal) pain. Most times, the belly pain is not dangerous. Many cases of belly pain can be watched and treated at home. HOME CARE   Do not take medicines that help you go poop (laxatives) unless told to by your doctor.  Only take medicine as told by your doctor.  Eat or drink as told by your doctor. Your doctor will tell you if you should be on a special diet. GET HELP IF:  You do not know what is causing your belly pain.  You have belly pain while you are sick to your stomach (nauseous) or have runny poop (diarrhea).  You have pain while you pee or poop.  Your belly pain wakes you up at night.  You have belly pain that gets worse or better when you eat.  You have belly pain that gets worse when you eat fatty foods.  You have a fever. GET HELP RIGHT AWAY IF:   The pain does not go away within 2 hours.  You keep throwing up (vomiting).  The pain changes and is only in the right or left part of the belly.  You have bloody or tarry looking poop. MAKE SURE YOU:   Understand these instructions.  Will watch your condition.  Will get help right away if you are not doing well or get worse. Document Released: 08/30/2007 Document Revised: 03/18/2013 Document Reviewed: 11/20/2012 [facility] Patient Information 2015 [place], [place]. This information is not intended to replace advice given to you by your health care provider. Make sure you discuss any questions you have with your health care provider.

## 2014-01-13 NOTE — ED Provider Notes (Signed)
Patient was signed out to me and pending CT scan. CT scan did not reveal any cause for right lower quadrant pain, appendix was seen and was normal. Upon my repeat assessment patient's abdomen is soft and nontender. She states she still has pain in her belly allergies no tenderness on exam. She was told to hold her primary care physician within 3 days, will be discharged with pain medication and nausea medication. Her vital signs remain within her normal limits and she is safe for discharge.  Everlene Balls, MD 01/13/14 [phone]

## 2014-01-16 ENCOUNTER — Ambulatory Visit: Payer: BC Managed Care – PPO | Attending: Family Medicine

## 2014-01-16 MED ORDER — ONDANSETRON 4 MG PO TBDP: 4.0000 mg | ORAL_TABLET | Freq: Three times a day (TID) | ORAL | Status: DC | PRN

## 2014-01-16 MED ORDER — AMLODIPINE BESYLATE 10 MG PO TABS: 10.0000 mg | ORAL_TABLET | Freq: Every day | ORAL | Status: DC

## 2014-01-16 NOTE — Progress Notes (Unsigned)
Patient ID: [name], female   DOB: [birth year], 63 y.o.   MRN: [id number] Pt comes in for blood pressure recheck s/p hypertension during last visit Pt requested Labetalol medication which is effective for her C/o slight headache. Denies chest pain or sob BP- 176/72 64 Reported to Dr. Adrian Blackwater for review Orders to add Amlodipine 10 mg tab to regimen

## 2014-01-16 NOTE — Patient Instructions (Signed)
Start taking Labetalol 100 mg ad ordered 1 tablet twice a day Start new medication Norvasc 10 mg tab and return in 2 weeks for nurse visit BP check

## 2014-02-17 ENCOUNTER — Other Ambulatory Visit: Payer: Self-pay | Admitting: Family Medicine

## 2014-02-18 ENCOUNTER — Telehealth: Payer: Self-pay | Admitting: Family Medicine

## 2014-02-18 DIAGNOSIS — I10 Essential (primary) hypertension: Secondary | ICD-10-CM

## 2014-02-18 MED ORDER — LABETALOL HCL 100 MG PO TABS: 100.0000 mg | ORAL_TABLET | Freq: Two times a day (BID) | ORAL | Status: DC

## 2014-02-18 NOTE — Telephone Encounter (Signed)
Please call patient,  One month labetolol refill sent to [facility]. F/u appt needed for HTN w/in next 1-3 weeks

## 2014-02-23 NOTE — Telephone Encounter (Signed)
Left message with information, if any question please return  Call    Please call patient,  One month labetolol refill sent to [facility]. F/u appt needed for HTN w/in next 1-3 weeks

## 2014-03-24 ENCOUNTER — Encounter: Payer: Self-pay | Admitting: Family Medicine

## 2014-03-24 ENCOUNTER — Ambulatory Visit: Payer: BC Managed Care – PPO | Attending: Family Medicine | Admitting: Family Medicine

## 2014-03-24 DIAGNOSIS — I10 Essential (primary) hypertension: Secondary | ICD-10-CM

## 2014-03-24 MED ORDER — CLONIDINE HCL 0.1 MG PO TABS
0.1000 mg | ORAL_TABLET | Freq: Once | ORAL | Status: AC
Start: 2014-03-24 — End: 2014-03-24
  Administered 2014-03-24: 0.1 mg via ORAL

## 2014-03-24 MED ORDER — AMLODIPINE BESYLATE 10 MG PO TABS: 10.0000 mg | ORAL_TABLET | Freq: Every day | ORAL | Status: DC

## 2014-03-24 MED ORDER — LABETALOL HCL 200 MG PO TABS: 200.0000 mg | ORAL_TABLET | Freq: Two times a day (BID) | ORAL | Status: DC

## 2014-03-24 NOTE — Progress Notes (Signed)
   Subjective:    Patient ID: [name], female    DOB: 05/23/1950, 63 y.o.   MRN: [id number] CC: medical clearance for L knee replacement  HPI 63 yo F with history of b/l knee OA s/p b/l knee arthroscopy presents for f/u visit:   1. HTN: taking labetalol and Norvasc. No medication this AM. Denies HA, CP, SOB, LE edema.   Soc Hx: former smoker, quit in 2012  Review of Systems As per HPI     Objective:   Physical Exam BP 180/80 mmHg  Pulse 79  Temp(Src) 98.1 F (36.7 C) (Oral)  Resp 18  Ht 5\' 2"  (1.575 m)  Wt 151 lb (68.493 kg)  BMI 27.61 kg/m2  SpO2 99%  BP Readings from Last 3 Encounters:  03/24/14 180/80  01/16/14 176/72  01/13/14 153/71  General appearance: alert, cooperative and no distress Lungs: clear to auscultation bilaterally Heart: regular rate and rhythm, S1, S2 normal, no murmur, click, rub or gallop  Ext: no edema   Clonidine 0.1 mg x one given in office      Assessment & Plan:

## 2014-03-24 NOTE — Assessment & Plan Note (Signed)
A: elevated above goal Med: questionable compliance  P:  Refilled labetalol 200 mg BID and Norvasc 10 mg daily to onsite pharmacy Please bring medications to follow up appointment.  Close f/u in 10-14 days with RN F/u with me in 4 weeks  Will need two normal BP readings prior to surgical clearance.

## 2014-03-24 NOTE — Progress Notes (Signed)
Needs clearance for surgery. Stated going to have Lt Knee surgery on surgery

## 2014-03-24 NOTE — Patient Instructions (Addendum)
Ms. Hoey,  Thank you for coming in today,  Refilled labetalol 200 mg BID and Norvasc 10 mg daily to onsite pharmacy Please bring medications to follow up appointment.   Close f/u in 10-14 days with RN for HTN F/u with me in 3- 4 weeks for HTN   Will need two normal BP readings prior to surgical clearance.   Dr. Adrian Blackwater

## 2014-03-25 ENCOUNTER — Telehealth: Payer: Self-pay | Admitting: Family Medicine

## 2014-03-25 NOTE — Telephone Encounter (Signed)
Expand All Collapse All   Patient presents to clinic today to drop off paperwork from [facility] for pre-operative clearance. Upon completion, patient would like form faxed back to [facility] at [phone]. Please follow up and assist.

## 2014-03-25 NOTE — Telephone Encounter (Signed)
Patient presents to clinic today to drop off paperwork from [facility] for pre-operative clearance. Upon completion, patient would like form faxed back to [facility] at [phone]. Please follow up and assist.

## 2014-03-26 NOTE — Telephone Encounter (Signed)
As we discussed in the office, patient will need f/u in the office for BP control prior to being cleared for surgery. Patient must schedule nurse visit and PCP visit if she has not already done so.  BP Readings from Last 3 Encounters:  03/24/14 170/78  01/16/14 176/72  01/13/14 153/71

## 2014-04-04 ENCOUNTER — Encounter (HOSPITAL_COMMUNITY): Payer: Self-pay | Admitting: Family Medicine

## 2014-04-04 ENCOUNTER — Emergency Department (HOSPITAL_COMMUNITY)
Admission: EM | Admit: 2014-04-04 | Discharge: 2014-04-04 | Disposition: A | Discharge status: Home / Self Care | Payer: BC Managed Care – PPO | Attending: Emergency Medicine | Admitting: Emergency Medicine

## 2014-04-04 DIAGNOSIS — M79675 Pain in left toe(s): Secondary | ICD-10-CM

## 2014-04-04 DIAGNOSIS — I10 Essential (primary) hypertension: Secondary | ICD-10-CM | POA: Insufficient documentation

## 2014-04-04 DIAGNOSIS — Z79899 Other long term (current) drug therapy: Secondary | ICD-10-CM | POA: Diagnosis not present

## 2014-04-04 DIAGNOSIS — R109 Unspecified abdominal pain: Secondary | ICD-10-CM | POA: Diagnosis not present

## 2014-04-04 DIAGNOSIS — R011 Cardiac murmur, unspecified: Secondary | ICD-10-CM | POA: Diagnosis not present

## 2014-04-04 DIAGNOSIS — K0510 Chronic gingivitis, plaque induced: Secondary | ICD-10-CM

## 2014-04-04 DIAGNOSIS — Z91040 Latex allergy status: Secondary | ICD-10-CM | POA: Diagnosis not present

## 2014-04-04 DIAGNOSIS — W2203XA Walked into furniture, initial encounter: Secondary | ICD-10-CM | POA: Diagnosis not present

## 2014-04-04 DIAGNOSIS — S99922A Unspecified injury of left foot, initial encounter: Secondary | ICD-10-CM | POA: Diagnosis not present

## 2014-04-04 DIAGNOSIS — K047 Periapical abscess without sinus: Secondary | ICD-10-CM | POA: Diagnosis not present

## 2014-04-04 DIAGNOSIS — Y9289 Other specified places as the place of occurrence of the external cause: Secondary | ICD-10-CM | POA: Insufficient documentation

## 2014-04-04 DIAGNOSIS — Z87891 Personal history of nicotine dependence: Secondary | ICD-10-CM | POA: Diagnosis not present

## 2014-04-04 DIAGNOSIS — Z7982 Long term (current) use of aspirin: Secondary | ICD-10-CM | POA: Insufficient documentation

## 2014-04-04 DIAGNOSIS — Y9389 Activity, other specified: Secondary | ICD-10-CM | POA: Diagnosis not present

## 2014-04-04 DIAGNOSIS — K0501 Acute gingivitis, non-plaque induced: Secondary | ICD-10-CM | POA: Diagnosis not present

## 2014-04-04 DIAGNOSIS — Z880 Allergy status to penicillin: Secondary | ICD-10-CM | POA: Insufficient documentation

## 2014-04-04 DIAGNOSIS — Y998 Other external cause status: Secondary | ICD-10-CM | POA: Insufficient documentation

## 2014-04-04 DIAGNOSIS — M1990 Unspecified osteoarthritis, unspecified site: Secondary | ICD-10-CM | POA: Insufficient documentation

## 2014-04-04 DIAGNOSIS — K088 Other specified disorders of teeth and supporting structures: Secondary | ICD-10-CM | POA: Diagnosis present

## 2014-04-04 LAB — CBC WITH DIFFERENTIAL/PLATELET
BASOS PCT: 1 % (ref 0–1)
Basophils Absolute: 0.1 10*3/uL (ref 0.0–0.1)
EOS PCT: 2 % (ref 0–5)
Eosinophils Absolute: 0.2 10*3/uL (ref 0.0–0.7)
HCT: 37.5 % (ref 36.0–46.0)
Hemoglobin: 12.4 g/dL (ref 12.0–15.0)
Lymphocytes Relative: 28 % (ref 12–46)
Lymphs Abs: 2.4 10*3/uL (ref 0.7–4.0)
MCH: 31.3 pg (ref 26.0–34.0)
MCHC: 33.1 g/dL (ref 30.0–36.0)
MCV: 94.7 fL (ref 78.0–100.0)
MONO ABS: 0.4 10*3/uL (ref 0.1–1.0)
MONOS PCT: 5 % (ref 3–12)
Neutro Abs: 5.5 10*3/uL (ref 1.7–7.7)
Neutrophils Relative %: 64 % (ref 43–77)
Platelets: 288 10*3/uL (ref 150–400)
RBC: 3.96 MIL/uL (ref 3.87–5.11)
RDW: 13.5 % (ref 11.5–15.5)
WBC: 8.5 10*3/uL (ref 4.0–10.5)

## 2014-04-04 LAB — URINALYSIS, ROUTINE W REFLEX MICROSCOPIC
Bilirubin Urine: NEGATIVE
GLUCOSE, UA: NEGATIVE mg/dL
Hgb urine dipstick: NEGATIVE
Ketones, ur: NEGATIVE mg/dL
LEUKOCYTES UA: NEGATIVE
Nitrite: NEGATIVE
Protein, ur: NEGATIVE mg/dL
SPECIFIC GRAVITY, URINE: 1.018 (ref 1.005–1.030)
UROBILINOGEN UA: 0.2 mg/dL (ref 0.0–1.0)
pH: 5.5 (ref 5.0–8.0)

## 2014-04-04 LAB — I-STAT CHEM 8, ED
BUN: 9 mg/dL (ref 6–23)
CALCIUM ION: 1.13 mmol/L (ref 1.13–1.30)
CHLORIDE: 105 meq/L (ref 96–112)
Creatinine, Ser: 0.7 mg/dL (ref 0.50–1.10)
Glucose, Bld: 87 mg/dL (ref 70–99)
HEMATOCRIT: 40 % (ref 36.0–46.0)
Hemoglobin: 13.6 g/dL (ref 12.0–15.0)
Potassium: 3.6 mmol/L (ref 3.5–5.1)
Sodium: 142 mmol/L (ref 135–145)
TCO2: 25 mmol/L (ref 0–100)

## 2014-04-04 MED ORDER — CLINDAMYCIN HCL 150 MG PO CAPS: 300.0000 mg | ORAL_CAPSULE | Freq: Four times a day (QID) | ORAL | Status: DC

## 2014-04-04 MED ORDER — OXYCODONE-ACETAMINOPHEN 5-325 MG PO TABS
1.0000 | ORAL_TABLET | Freq: Once | ORAL | Status: AC
  Administered 2014-04-04: 1 via ORAL
  Filled 2014-04-04: qty 1

## 2014-04-04 MED ORDER — HYDROCODONE-ACETAMINOPHEN 5-325 MG PO TABS: 1.0000 | ORAL_TABLET | Freq: Four times a day (QID) | ORAL | Status: DC | PRN

## 2014-04-04 NOTE — Discharge Instructions (Signed)
Ibuprofen for pain. norco for severe pain. Take clindamycin for infection until all gone. Please follow up with your primary care doctor as scheduled for recheck and with a dentist.   Dental Abscess A dental abscess is a collection of infected fluid (pus) from a bacterial infection in the inner part of the tooth (pulp). It usually occurs at the end of the tooth's root.  CAUSES   Severe tooth decay.  Trauma to the tooth that allows bacteria to enter into the pulp, such as a broken or chipped tooth. SYMPTOMS   Severe pain in and around the infected tooth.  Swelling and redness around the abscessed tooth or in the mouth or face.  Tenderness.  Pus drainage.  Bad breath.  Bitter taste in the mouth.  Difficulty swallowing.  Difficulty opening the mouth.  Nausea.  Vomiting.  Chills.  Swollen neck glands. DIAGNOSIS   A medical and dental history will be taken.  An examination will be performed by tapping on the abscessed tooth.  X-rays may be taken of the tooth to identify the abscess. TREATMENT The goal of treatment is to eliminate the infection. You may be prescribed antibiotic medicine to stop the infection from spreading. A root canal may be performed to save the tooth. If the tooth cannot be saved, it may be pulled (extracted) and the abscess may be drained.  HOME CARE INSTRUCTIONS  Only take over-the-counter or prescription medicines for pain, fever, or discomfort as directed by your caregiver.  Rinse your mouth (gargle) often with salt water ( tsp salt in 8 oz [250 ml] of warm water) to relieve pain or swelling.  Do not drive after taking pain medicine (narcotics).  Do not apply heat to the outside of your face.  Return to your dentist for further treatment as directed. SEEK MEDICAL CARE IF:  Your pain is not helped by medicine.  Your pain is getting worse instead of better. SEEK IMMEDIATE MEDICAL CARE IF:  You have a fever or persistent symptoms for more  than 2-3 days.  You have a fever and your symptoms suddenly get worse.  You have chills or a very bad headache.  You have problems breathing or swallowing.  You have trouble opening your mouth.  You have swelling in the neck or around the eye. Document Released: 03/13/2005 Document Revised: 12/06/2011 Document Reviewed: 06/21/2010 [facility] Patient Information 2015 [place], [place]. This information is not intended to replace advice given to you by your health care provider. Make sure you discuss any questions you have with your health care provider.

## 2014-04-04 NOTE — ED Notes (Signed)
Per pt sts right side pain, nausea, dental pain and toe pain.

## 2014-04-04 NOTE — ED Provider Notes (Signed)
CSN: [id number]     Arrival date & time 04/04/14  1242 History   First MD Initiated Contact with Patient 04/04/14 1256     Chief Complaint  Patient presents with  . Abdominal Pain  . Dental Pain  . Toe Pain     (Consider location/radiation/quality/duration/timing/severity/associated sxs/prior Treatment) HPI [name] is a 64 y.o. female with past hx of htn, presents to ED with multiple complaints. Pt states she is having dental pain with gum swelling, left great toe pain, abdominal pain. States toothache started 1 week ago, states has bad dentition, unable to see a dentist due to no insurance. States toe pain began 3 days ago after she hit it on a piece of furniture. No bruising, swelling. No difficulty walking. Pain only with palpation. Pt has not tried anything for pain. Pt also states she developed right sided abdominal pain/flank pain. Onset of pain today. Hx of the same. Denies fever, chills, nausea, vomiting, urinary sytmptoms. No other complaints.   Past Medical History  Diagnosis Date  . Heart murmur   . Meniscus tear     L KNEE  . Arthritis 2000  . Hypertension 2010   Past Surgical History  Procedure Laterality Date  . Knee surgery Right 1999  . Tubal ligation  1979  . Abdominal surgery  2010    for bowel blockage  . Knee arthroscopy Left 09/25/2013    Procedure: LEFT KNEE ARTHROSCOPY WITH PARTIAL MEDIAL AND LATERAL  MENISCECTOMY/DEBRIDEMENT/MICRO FRACTURE MEDIAL FEMORAL CONDYLE, REMOVAL OF OSTEOCONDRAL FRAGMENT;  Surgeon: Johnn Hai, MD;  Location: [facility];  Service: Orthopedics;  Laterality: Left;   Family History  Problem Relation Age of Onset  . Hypertension Mother   . Heart disease Mother     has a pacemaker   . Heart failure Father   . Hypertension Father   . Cancer Neg Hx    History  Substance Use Topics  . Smoking status: Former Smoker    Quit date: 07/29/2010  . Smokeless tobacco: Never Used  . Alcohol Use: No   OB History    No data available      Review of Systems  Constitutional: Negative for fever and chills.  HENT: Positive for dental problem. Negative for facial swelling.   Respiratory: Negative for cough, chest tightness and shortness of breath.   Cardiovascular: Negative for chest pain, palpitations and leg swelling.  Gastrointestinal: Positive for abdominal pain. Negative for nausea, vomiting and diarrhea.  Genitourinary: Negative for dysuria, flank pain, vaginal bleeding, vaginal discharge, vaginal pain and pelvic pain.  Musculoskeletal: Positive for joint swelling and arthralgias. Negative for neck pain and neck stiffness.  Skin: Negative for rash.  Neurological: Negative for dizziness, weakness and headaches.  All other systems reviewed and are negative.     Allergies  Shrimp; Ace inhibitors; Latex; and Penicillins  Home Medications   Prior to Admission medications   Medication Sig Start Date End Date Taking? Authorizing Provider  amLODipine (NORVASC) 10 MG tablet Take 1 tablet (10 mg total) by mouth daily. 03/24/14   Minerva Ends, MD  aspirin 325 MG EC tablet Take 325 mg by mouth daily.    Historical Provider, MD  labetalol (NORMODYNE) 200 MG tablet Take 1 tablet (200 mg total) by mouth 2 (two) times daily. 03/24/14   Minerva Ends, MD  Multiple Vitamin (MULITIVITAMIN WITH MINERALS) TABS Take 1 tablet by mouth daily.    Historical Provider, MD  potassium chloride SA (K-DUR,KLOR-CON) 20 MEQ tablet Take 1  tablet (20 mEq total) by mouth 2 (two) times daily. 01/09/14   Janice Norrie, MD   BP 133/55 mmHg  Pulse 66  Temp(Src) 98.3 F (36.8 C)  Resp 18  SpO2 100% Physical Exam  Constitutional: She is oriented to person, place, and time. She appears well-developed and well-nourished. No distress.  HENT:  Head: Normocephalic.  Poor dentition, most teeth missing. Multiple decayed teeth. Lower gum erythematous, there is a dental abscess over right lower lateral incisor. TTP. No swelling under the tongue. No  trismus  Eyes: Conjunctivae are normal.  Neck: Neck supple.  Cardiovascular: Normal rate, regular rhythm and normal heart sounds.   Pulmonary/Chest: Effort normal and breath sounds normal. No respiratory distress. She has no wheezes. She has no rales.  Abdominal: Soft. Bowel sounds are normal. She exhibits no distension. There is no tenderness. There is no rebound.  Musculoskeletal: She exhibits no edema.  Left great toe with onychomycosis, otherwise normal, non tender, full rom.   Lymphadenopathy:    She has no cervical adenopathy.  Neurological: She is alert and oriented to person, place, and time.  Skin: Skin is warm and dry.  Psychiatric: She has a normal mood and affect. Her behavior is normal.  Nursing note and vitals reviewed.   ED Course  Procedures (including critical care time) Labs Review Labs Reviewed  CBC WITH DIFFERENTIAL  URINALYSIS, ROUTINE W REFLEX MICROSCOPIC  I-STAT CHEM 8, ED    Imaging Review No results found.   EKG Interpretation None      MDM   Final diagnoses:  Dental abscess  Gingivitis  Abdominal pain, unspecified abdominal location  Great toe pain, left   Pt here with multiple complaints. Complaining of dental pain, abdominal pain, left great toe pain. Patient does have a dental abscess, gingivitis, multiple dental caries. She will need antibiotics and pain management. Her abdomen on exam is nontender, no peritoneal signs, no concern for acute abdomen or intra-abdominal infections. Patient's toe has fungal infection of the nail, otherwise normal, minimal tenderness, no bruising, do not think she needs imaging at this time. She is ambulating on it without difficulties. Will get labs, urinalysis, Percocet for pain.  3:34 PM Pt's labs and UA negative. Home with clindamycin, norco. Pt has an apt with pcp in 3 days, will give referral to dentist.   Filed Vitals:   04/04/14 1345 04/04/14 1400 04/04/14 1530 04/04/14 1531  BP: 133/55 124/54 141/63    Pulse: 66 64 59   Temp:      Resp:   16 16  SpO2: 100% 99% 99%      Renold Genta, PA-C 04/04/14 [place], MD 04/04/14 1735

## 2014-04-07 ENCOUNTER — Ambulatory Visit: Payer: BC Managed Care – PPO | Attending: Family Medicine | Admitting: *Deleted

## 2014-04-07 VITALS — BP 129/65 | HR 63 | Temp 98.1°F | Resp 16

## 2014-04-07 DIAGNOSIS — I10 Essential (primary) hypertension: Secondary | ICD-10-CM

## 2014-04-07 DIAGNOSIS — Z87891 Personal history of nicotine dependence: Secondary | ICD-10-CM | POA: Insufficient documentation

## 2014-04-07 NOTE — Patient Instructions (Signed)
DASH Eating Plan °DASH stands for "Dietary Approaches to Stop Hypertension." The DASH eating plan is a healthy eating plan that has been shown to reduce high blood pressure (hypertension). Additional health benefits may include reducing the risk of type 2 diabetes mellitus, heart disease, and stroke. The DASH eating plan may also help with weight loss. °WHAT DO I NEED TO KNOW ABOUT THE DASH EATING PLAN? °For the DASH eating plan, you will follow these general guidelines: °· Choose foods with a percent daily value for sodium of less than 5% (as listed on the food label). °· Use salt-free seasonings or herbs instead of table salt or sea salt. °· Check with your health care provider or pharmacist before using salt substitutes. °· Eat lower-sodium products, often labeled as "lower sodium" or "no salt added." °· Eat fresh foods. °· Eat more vegetables, fruits, and low-fat dairy products. °· Choose whole grains. Look for the word "whole" as the first word in the ingredient list. °· Choose fish and skinless chicken or turkey more often than red meat. Limit fish, poultry, and meat to 6 oz (170 g) each day. °· Limit sweets, desserts, sugars, and sugary drinks. °· Choose heart-healthy fats. °· Limit cheese to 1 oz (28 g) per day. °· Eat more home-cooked food and less restaurant, buffet, and fast food. °· Limit fried foods. °· Cook foods using methods other than frying. °· Limit canned vegetables. If you do use them, rinse them well to decrease the sodium. °· When eating at a restaurant, ask that your food be prepared with less salt, or no salt if possible. °WHAT FOODS CAN I EAT? °Seek help from a dietitian for individual calorie needs. °Grains °Whole grain or whole wheat bread. Brown rice. Whole grain or whole wheat pasta. Quinoa, bulgur, and whole grain cereals. Low-sodium cereals. Corn or whole wheat flour tortillas. Whole grain cornbread. Whole grain crackers. Low-sodium crackers. °Vegetables °Fresh or frozen vegetables  (raw, steamed, roasted, or grilled). Low-sodium or reduced-sodium tomato and vegetable juices. Low-sodium or reduced-sodium tomato sauce and paste. Low-sodium or reduced-sodium canned vegetables.  °Fruits °All fresh, canned (in natural juice), or frozen fruits. °Meat and Other Protein Products °Ground beef (85% or leaner), grass-fed beef, or beef trimmed of fat. Skinless chicken or turkey. Ground chicken or turkey. Pork trimmed of fat. All fish and seafood. Eggs. Dried beans, peas, or lentils. Unsalted nuts and seeds. Unsalted canned beans. °Dairy °Low-fat dairy products, such as skim or 1% milk, 2% or reduced-fat cheeses, low-fat ricotta or cottage cheese, or plain low-fat yogurt. Low-sodium or reduced-sodium cheeses. °Fats and Oils °Tub margarines without trans fats. Light or reduced-fat mayonnaise and salad dressings (reduced sodium). Avocado. Safflower, olive, or canola oils. Natural peanut or almond butter. °Other °Unsalted popcorn and pretzels. °The items listed above may not be a complete list of recommended foods or beverages. Contact your dietitian for more options. °WHAT FOODS ARE NOT RECOMMENDED? °Grains °White bread. White pasta. White rice. Refined cornbread. Bagels and croissants. Crackers that contain trans fat. °Vegetables °Creamed or fried vegetables. Vegetables in a cheese sauce. Regular canned vegetables. Regular canned tomato sauce and paste. Regular tomato and vegetable juices. °Fruits °Dried fruits. Canned fruit in light or heavy syrup. Fruit juice. °Meat and Other Protein Products °Fatty cuts of meat. Ribs, chicken wings, bacon, sausage, bologna, salami, chitterlings, fatback, hot dogs, bratwurst, and packaged luncheon meats. Salted nuts and seeds. Canned beans with salt. °Dairy °Whole or 2% milk, cream, half-and-half, and cream cheese. Whole-fat or sweetened yogurt. Full-fat   cheeses or blue cheese. Nondairy creamers and whipped toppings. Processed cheese, cheese spreads, or cheese  curds. °Condiments °Onion and garlic salt, seasoned salt, table salt, and sea salt. Canned and packaged gravies. Worcestershire sauce. Tartar sauce. Barbecue sauce. Teriyaki sauce. Soy sauce, including reduced sodium. Steak sauce. Fish sauce. Oyster sauce. Cocktail sauce. Horseradish. Ketchup and mustard. Meat flavorings and tenderizers. Bouillon cubes. Hot sauce. Tabasco sauce. Marinades. Taco seasonings. Relishes. °Fats and Oils °Butter, stick margarine, lard, shortening, ghee, and bacon fat. Coconut, palm kernel, or palm oils. Regular salad dressings. °Other °Pickles and olives. Salted popcorn and pretzels. °The items listed above may not be a complete list of foods and beverages to avoid. Contact your dietitian for more information. °WHERE CAN I FIND MORE INFORMATION? °National Heart, [employer], [employer]: www.nhlbi.nih.gov/health/health-topics/topics/dash/ °Document Released: 03/02/2011 Document Revised: 07/28/2013 Document Reviewed: 01/15/2013 °ExitCare® Patient Information ©2015 [employer]. This information is not intended to replace advice given to you by your health care provider. Make sure you discuss any questions you have with your health care provider. ° °

## 2014-04-07 NOTE — Progress Notes (Signed)
Patient presents for BP check Med list reviewed; states taking all meds as directed States just left dentist office where 4 teeth were pulled Discussed need for low sodium diet and using Mrs. Dash as alternative to salt  BP 129/65 P 63 R 16  T 98.1 oral SPO2  100%  Provided literature on DASH Eating Plan  Patient advised to call for med refills at least 7 days before running out so as not to go without. Patient aware that she is to f/u with PCP 4 weeks from last visit (Due 04/21/14)

## 2014-04-16 ENCOUNTER — Ambulatory Visit: Payer: Self-pay | Admitting: Family Medicine

## 2014-04-21 ENCOUNTER — Telehealth: Payer: Self-pay | Admitting: *Deleted

## 2014-04-21 NOTE — Telephone Encounter (Signed)
Left voice message to return call Pt need F/U visit for BP check before clearance for surgery per Dr Adrian Blackwater

## 2014-04-22 ENCOUNTER — Encounter: Payer: Self-pay | Admitting: Family Medicine

## 2014-04-22 NOTE — Progress Notes (Signed)
Patient ID: [name], female   DOB: [birth year], 64 y.o.   MRN: [id number] Medical clearance form filled out and placed in to be faxed box. Recommended postponing Sx until pt's BP normal x 2 visit. BP normal x 1 visit so far.  Next visit on 04/27/14

## 2014-04-27 ENCOUNTER — Ambulatory Visit: Payer: BC Managed Care – PPO | Attending: Family Medicine | Admitting: Family Medicine

## 2014-04-27 ENCOUNTER — Encounter: Payer: Self-pay | Admitting: Family Medicine

## 2014-04-27 VITALS — BP 150/80 | HR 60 | Temp 98.0°F | Resp 16 | Ht 65.0 in | Wt 156.0 lb

## 2014-04-27 DIAGNOSIS — H5213 Myopia, bilateral: Secondary | ICD-10-CM | POA: Diagnosis not present

## 2014-04-27 DIAGNOSIS — H5210 Myopia, unspecified eye: Secondary | ICD-10-CM | POA: Insufficient documentation

## 2014-04-27 DIAGNOSIS — I10 Essential (primary) hypertension: Secondary | ICD-10-CM | POA: Insufficient documentation

## 2014-04-27 DIAGNOSIS — Z87891 Personal history of nicotine dependence: Secondary | ICD-10-CM | POA: Diagnosis not present

## 2014-04-27 DIAGNOSIS — R9431 Abnormal electrocardiogram [ECG] [EKG]: Secondary | ICD-10-CM | POA: Insufficient documentation

## 2014-04-27 MED ORDER — LABETALOL HCL 200 MG PO TABS: 200.0000 mg | ORAL_TABLET | Freq: Two times a day (BID) | ORAL | Status: DC

## 2014-04-27 NOTE — Progress Notes (Signed)
Blood pressure recheck in order to get medical clearance for surgery Patient took HTN  medications this morning

## 2014-04-27 NOTE — Assessment & Plan Note (Signed)
A; reported hx of nearsightedness, request eye exam P: Vision screen done Referral to optometry

## 2014-04-27 NOTE — Progress Notes (Signed)
   Subjective:    Patient ID: [name], female    DOB: [birth year], 64 y.o.   MRN: [id number] CC: f/u HTN, request referral to eye doctor  HPI 64 yo F f/u:  1. HTN: compliant with norvasc and labetalol. Took medication this AM. Denies HA, vision changes, CP, SOB, swelling and fatigue. Is awaiting medical clearance letter for L knee surgery to be done by. Dr. Tonita Cong at [place] once BP controlled.   2. Request vision screen: hx of nearsightedness. No eye exam in many years. Denies vision change.   Soc Hx: former smoker, quit in 2012  Review of Systems As per HPI     Objective:   Physical Exam BP 158/74 mmHg  Pulse 60  Temp(Src) 98 F (36.7 C)  Resp 16  Ht 5\' 5"  (1.651 m)  Wt 156 lb (70.761 kg)  BMI 25.96 kg/m2  SpO2 100%  BP Readings from Last 3 Encounters:  04/27/14 158/74  04/07/14 129/65  04/04/14 141/63  General appearance: alert, cooperative and no distress Lungs: clear to auscultation bilaterally Heart: regular rate and rhythm, S1, S2 normal, no murmur, click, rub or gallop Extremities: extremities normal, atraumatic, no cyanosis or edema     Assessment & Plan:

## 2014-04-27 NOTE — Patient Instructions (Addendum)
Mrs. Hamor,  Thank you for coming in today.  1. HTN: goal BP is < 140/90  BP Readings from Last 3 Encounters:  04/27/14 150/80  04/07/14 129/65  04/04/14 141/63   Please continue current regimen. F/u in 1 week with nurse for BP check. If doing well at this appt will send the medical clearance letter to your surgeon.   2. Vision screen: 20/40 referral to optometry.   See me in 3 months for HTN.   Dr. Adrian Blackwater

## 2014-04-27 NOTE — Assessment & Plan Note (Signed)
A: improved, at goal last visit with RN. Slightly above this visit. ? White coat HTN.  Med: compliant P: Continue current BP meds F/u in 1 week with RN F/u with me in 3 months  Medical clearance letter to Dr. Bernadette Hoit written

## 2014-05-15 ENCOUNTER — Ambulatory Visit: Payer: BC Managed Care – PPO | Attending: Family Medicine | Admitting: *Deleted

## 2014-05-15 VITALS — BP 139/75 | HR 75 | Temp 98.1°F | Resp 20

## 2014-05-15 DIAGNOSIS — R9431 Abnormal electrocardiogram [ECG] [EKG]: Secondary | ICD-10-CM | POA: Diagnosis not present

## 2014-05-15 DIAGNOSIS — I10 Essential (primary) hypertension: Secondary | ICD-10-CM | POA: Diagnosis not present

## 2014-05-15 NOTE — Progress Notes (Signed)
Patient presents for BP check Med list reviewed; states taking all meds as directed Using mrs Dash instead of salt. Choosing low sodium foods Discussed walking 30 minutes per day for exercise  BP 139/75 P 75 R 20  T  98.1 oral SPO2  100%  Patient advised to call for med refills at least 7 days before running out so as not to go without. Patient aware that she is to f/u with PCP 3 months from last visit (Due 07/26/14)

## 2014-05-15 NOTE — Patient Instructions (Signed)

## 2014-06-05 ENCOUNTER — Encounter (HOSPITAL_COMMUNITY): Payer: Self-pay | Admitting: Emergency Medicine

## 2014-06-05 ENCOUNTER — Emergency Department (HOSPITAL_COMMUNITY)
Admission: EM | Admit: 2014-06-05 | Discharge: 2014-06-05 | Disposition: A | Discharge status: Home / Self Care | Payer: BC Managed Care – PPO | Attending: Emergency Medicine | Admitting: Emergency Medicine

## 2014-06-05 DIAGNOSIS — R202 Paresthesia of skin: Secondary | ICD-10-CM | POA: Diagnosis not present

## 2014-06-05 DIAGNOSIS — H578 Other specified disorders of eye and adnexa: Secondary | ICD-10-CM | POA: Diagnosis not present

## 2014-06-05 DIAGNOSIS — J302 Other seasonal allergic rhinitis: Secondary | ICD-10-CM | POA: Insufficient documentation

## 2014-06-05 DIAGNOSIS — R011 Cardiac murmur, unspecified: Secondary | ICD-10-CM | POA: Insufficient documentation

## 2014-06-05 DIAGNOSIS — M7989 Other specified soft tissue disorders: Secondary | ICD-10-CM | POA: Insufficient documentation

## 2014-06-05 DIAGNOSIS — R1084 Generalized abdominal pain: Secondary | ICD-10-CM | POA: Diagnosis present

## 2014-06-05 DIAGNOSIS — M1990 Unspecified osteoarthritis, unspecified site: Secondary | ICD-10-CM | POA: Diagnosis not present

## 2014-06-05 DIAGNOSIS — R0981 Nasal congestion: Secondary | ICD-10-CM | POA: Diagnosis not present

## 2014-06-05 DIAGNOSIS — J3489 Other specified disorders of nose and nasal sinuses: Secondary | ICD-10-CM | POA: Insufficient documentation

## 2014-06-05 DIAGNOSIS — J3089 Other allergic rhinitis: Secondary | ICD-10-CM

## 2014-06-05 DIAGNOSIS — Z880 Allergy status to penicillin: Secondary | ICD-10-CM | POA: Insufficient documentation

## 2014-06-05 DIAGNOSIS — Z7982 Long term (current) use of aspirin: Secondary | ICD-10-CM | POA: Insufficient documentation

## 2014-06-05 DIAGNOSIS — Z87891 Personal history of nicotine dependence: Secondary | ICD-10-CM | POA: Diagnosis not present

## 2014-06-05 DIAGNOSIS — M791 Myalgia: Secondary | ICD-10-CM | POA: Insufficient documentation

## 2014-06-05 DIAGNOSIS — Z79899 Other long term (current) drug therapy: Secondary | ICD-10-CM | POA: Diagnosis not present

## 2014-06-05 DIAGNOSIS — Z91040 Latex allergy status: Secondary | ICD-10-CM | POA: Insufficient documentation

## 2014-06-05 DIAGNOSIS — I10 Essential (primary) hypertension: Secondary | ICD-10-CM | POA: Insufficient documentation

## 2014-06-05 LAB — URINALYSIS, ROUTINE W REFLEX MICROSCOPIC
Bilirubin Urine: NEGATIVE
GLUCOSE, UA: NEGATIVE mg/dL
HGB URINE DIPSTICK: NEGATIVE
Ketones, ur: NEGATIVE mg/dL
Leukocytes, UA: NEGATIVE
Nitrite: NEGATIVE
PH: 5.5 (ref 5.0–8.0)
Protein, ur: NEGATIVE mg/dL
SPECIFIC GRAVITY, URINE: 1.018 (ref 1.005–1.030)
Urobilinogen, UA: 0.2 mg/dL (ref 0.0–1.0)

## 2014-06-05 LAB — I-STAT CHEM 8, ED
BUN: 10 mg/dL (ref 6–23)
CREATININE: 0.8 mg/dL (ref 0.50–1.10)
Calcium, Ion: 1.19 mmol/L (ref 1.13–1.30)
Chloride: 102 mmol/L (ref 96–112)
Glucose, Bld: 86 mg/dL (ref 70–99)
HCT: 41 % (ref 36.0–46.0)
Hemoglobin: 13.9 g/dL (ref 12.0–15.0)
Potassium: 3.3 mmol/L — ABNORMAL LOW (ref 3.5–5.1)
Sodium: 140 mmol/L (ref 135–145)
TCO2: 20 mmol/L (ref 0–100)

## 2014-06-05 LAB — CBC WITH DIFFERENTIAL/PLATELET
BASOS ABS: 0.1 10*3/uL (ref 0.0–0.1)
BASOS PCT: 1 % (ref 0–1)
Eosinophils Absolute: 0.4 10*3/uL (ref 0.0–0.7)
Eosinophils Relative: 4 % (ref 0–5)
HCT: 36.7 % (ref 36.0–46.0)
Hemoglobin: 12.1 g/dL (ref 12.0–15.0)
LYMPHS ABS: 3.3 10*3/uL (ref 0.7–4.0)
Lymphocytes Relative: 36 % (ref 12–46)
MCH: 30.1 pg (ref 26.0–34.0)
MCHC: 33 g/dL (ref 30.0–36.0)
MCV: 91.3 fL (ref 78.0–100.0)
Monocytes Absolute: 0.5 10*3/uL (ref 0.1–1.0)
Monocytes Relative: 5 % (ref 3–12)
Neutro Abs: 4.8 10*3/uL (ref 1.7–7.7)
Neutrophils Relative %: 54 % (ref 43–77)
Platelets: 255 10*3/uL (ref 150–400)
RBC: 4.02 MIL/uL (ref 3.87–5.11)
RDW: 13.7 % (ref 11.5–15.5)
WBC: 9.1 10*3/uL (ref 4.0–10.5)

## 2014-06-05 MED ORDER — LORATADINE 10 MG PO TABS: 10.0000 mg | ORAL_TABLET | Freq: Every day | ORAL | Status: DC | PRN

## 2014-06-05 NOTE — ED Notes (Signed)
Patient not happy with diagnoses, patient wants to be seen by another physician.  Patient was told that she is discharged and can sign back in to be seen by another physician.

## 2014-06-05 NOTE — ED Provider Notes (Signed)
CSN: [id number]     Arrival date & time 06/05/14  1947 History  This chart was scribed for Julianne Rice, MD by Delphia Grates, ED Scribe. This patient was seen in room D36C/D36C and the patient's care was started at 11:07 PM.     Chief Complaint  Patient presents with  . Abdominal Pain    The history is provided by the patient. No language interpreter was used.     HPI Comments: [name] is a 64 y.o. female who presents to the Emergency Department with multiple complaints. Initially the patient complained of a week of bilateral eye itching and redness. She denies any trauma. She admits to nasal congestion and sinus pressure. She's been taking Benadryl for this. She now complains of bilateral feet swelling with tingling sensation. This is also been going on for about a week. No known trauma. No recent extended travel or surgeries. The patient did not volunteer that she had abdominal pain but when asked specifically she admitted to generalized cramping abdominal pain onset 1 week ago. She reports normal BMs. She denies nausea, vomiting. She's had no fever or chills. She's had urinary frequency without dysuria, hematuria or urgency. She states she drinks a lot of water.  PCP: Minerva Ends, MD  Past Medical History  Diagnosis Date  . Heart murmur   . Meniscus tear     L KNEE  . Arthritis 2000  . Hypertension 2010  . Abnormal EKG 2012    hospitalized for T wave inversion in lateral leads with [facility] chest pains, normal ECHO, negative trops, no cardiology consult. recent EKG 7/15 stable T wave inversions.    Past Surgical History  Procedure Laterality Date  . Knee surgery Right 1999  . Tubal ligation  1979  . Abdominal surgery  2010    for bowel blockage  . Knee arthroscopy Left 09/25/2013    Procedure: LEFT KNEE ARTHROSCOPY WITH PARTIAL MEDIAL AND LATERAL  MENISCECTOMY/DEBRIDEMENT/MICRO FRACTURE MEDIAL FEMORAL CONDYLE, REMOVAL OF OSTEOCONDRAL FRAGMENT;  Surgeon: Johnn Hai, MD;  Location: [facility];  Service: Orthopedics;  Laterality: Left;   Family History  Problem Relation Age of Onset  . Hypertension Mother   . Heart disease Mother     has a pacemaker   . Heart failure Father   . Hypertension Father   . Cancer Neg Hx    History  Substance Use Topics  . Smoking status: Former Smoker    Quit date: 07/29/2010  . Smokeless tobacco: Never Used  . Alcohol Use: No   OB History    No data available     Review of Systems  Constitutional: Negative for fever and chills.  HENT: Positive for congestion, rhinorrhea and sinus pressure.   Eyes: Positive for redness and itching. Negative for visual disturbance.  Respiratory: Negative for cough, chest tightness and shortness of breath.   Cardiovascular: Positive for leg swelling. Negative for chest pain and palpitations.  Gastrointestinal: Positive for abdominal pain. Negative for nausea, vomiting, diarrhea, constipation and abdominal distention.  Genitourinary: Negative for dysuria, hematuria and flank pain.  Musculoskeletal: Positive for myalgias. Negative for back pain, neck pain and neck stiffness.  Skin: Negative for rash and wound.  Neurological: Negative for dizziness, syncope, weakness, numbness and headaches.  All other systems reviewed and are negative.     Allergies  Shrimp; Ace inhibitors; Latex; and Penicillins  Home Medications   Prior to Admission medications   Medication Sig Start Date End Date Taking? Authorizing Provider  amLODipine (NORVASC) 10 MG tablet Take 1 tablet (10 mg total) by mouth daily. 03/24/14   Boykin Nearing, MD  aspirin 325 MG EC tablet Take 325 mg by mouth daily.    Historical Provider, MD  labetalol (NORMODYNE) 200 MG tablet Take 1 tablet (200 mg total) by mouth 2 (two) times daily. 04/27/14   Boykin Nearing, MD  loratadine (CLARITIN) 10 MG tablet Take 1 tablet (10 mg total) by mouth daily as needed for allergies, rhinitis or itching. 06/05/14   Julianne Rice,  MD  Multiple Vitamin (MULITIVITAMIN WITH MINERALS) TABS Take 1 tablet by mouth daily.    Historical Provider, MD   Triage Vitals: BP 164/75 mmHg  Pulse 69  Temp(Src) 97.8 F (36.6 C) (Oral)  Resp 18  Ht 5\' 5"  (1.651 m)  Wt 152 lb (68.947 kg)  BMI 25.29 kg/m2  SpO2 96%  Physical Exam  Constitutional: She is oriented to person, place, and time. She appears well-developed and well-nourished. No distress.  Patient is in no distress. She appears very comfortable.  HENT:  Head: Normocephalic and atraumatic.  Mouth/Throat: Oropharynx is clear and moist. No oropharyngeal exudate.  Rhinorrhea present. Mild nasal tyurbinate swelling bilaterally. No sinus tenderness with percussion.  Eyes: EOM are normal. Pupils are equal, round, and reactive to light.  Very mild conjunctival injection bilaterally. Clear discharge. No foreign bodies visualized.  Neck: Normal range of motion. Neck supple. No tracheal deviation present.  Cardiovascular: Normal rate and regular rhythm.  Exam reveals no gallop and no friction rub.   No murmur heard. Pulmonary/Chest: Effort normal and breath sounds normal. No respiratory distress. She has no wheezes. She has no rales. She exhibits no tenderness.  Abdominal: Soft. Bowel sounds are normal. She exhibits no distension and no mass. There is no tenderness. There is no rebound and no guarding.  Musculoskeletal: Normal range of motion. She exhibits no edema or tenderness.  No CVA tenderness bilaterally. No calf tenderness with percussion. No obvious swelling. No pitting edema. 2+ dorsalis pedis and posterior tibial pulses. No joint effusions. No warmth or redness noted to the lower extremities.  Neurological: She is alert and oriented to person, place, and time.  5/5 motor in all extremities. Sensation is fully intact.  Skin: Skin is warm and dry. No rash noted. No erythema.  Psychiatric: She has a normal mood and affect. Her behavior is normal.  Nursing note and vitals  reviewed.   ED Course  Procedures (including critical care time)  DIAGNOSTIC STUDIES: Oxygen Saturation is 96% on room air, adequate by my interpretation.    COORDINATION OF CARE: At 2313 Discussed treatment plan with patient. Patient agrees.   Labs Review Labs Reviewed  I-STAT CHEM 8, ED - Abnormal; Notable for the following:    Potassium 3.3 (*)    All other components within normal limits  CBC WITH DIFFERENTIAL/PLATELET  URINALYSIS, ROUTINE W REFLEX MICROSCOPIC    Imaging Review No results found.   EKG Interpretation None      MDM   Final diagnoses:  Environmental and seasonal allergies  Generalized abdominal pain   I personally performed the services described in this documentation, which was scribed in my presence. The recorded information has been reviewed and is accurate.  Patient with multiple complaints. Rhinorrhea and eye itching likely due to seasonal allergies. Will start on Claritin. Abdominal exam is benign. She has normal abdominal labs with a normal white blood cell count and UA. Do not believe imaging is necessary at this point. I  cannot appreciate any swelling to her lower extremities. She has no calf tenderness and I have low suspicion for DVT. She's advised to keep her feet elevated and to follow-up with her primary physician for continued symptoms.   Julianne Rice, MD 06/05/14 2337

## 2014-06-05 NOTE — Discharge Instructions (Signed)
Hay Fever Hay fever is an allergic reaction to particles in the air. It cannot be passed from person to person. It cannot be cured, but it can be controlled. CAUSES  Hay fever is caused by something that triggers an allergic reaction (allergens). The following are examples of allergens:  Ragweed.  Feathers.  Animal dander.  Grass and tree pollens.  Cigarette smoke.  House dust.  Pollution. SYMPTOMS   Sneezing.  Runny or stuffy nose.  Tearing eyes.  Itchy eyes, nose, mouth, throat, skin, or other area.  Sore throat.  Headache.  Decreased sense of smell or taste. DIAGNOSIS Your caregiver will perform a physical exam and ask questions about the symptoms you are having.Allergy testing may be done to determine exactly what triggers your hay fever.  TREATMENT   Over-the-counter medicines may help symptoms. These include:  Antihistamines.  Decongestants. These may help with nasal congestion.  Your caregiver may prescribe medicines if over-the-counter medicines do not work.  Some people benefit from allergy shots when other medicines are not helpful. HOME CARE INSTRUCTIONS   Avoid the allergen that is causing your symptoms, if possible.  Take all medicine as told by your caregiver. SEEK MEDICAL CARE IF:   You have severe allergy symptoms and your current medicines are not helping.  Your treatment was working at one time, but you are now experiencing symptoms.  You have sinus congestion and pressure.  You develop a fever or headache.  You have thick nasal discharge.  You have asthma and have a worsening cough and wheezing. SEEK IMMEDIATE MEDICAL CARE IF:   You have swelling of your tongue or lips.  You have trouble breathing.  You feel lightheaded or like you are going to faint.  You have cold sweats.  You have a fever. Document Released: 03/13/2005 Document Revised: 06/05/2011 Document Reviewed: 06/08/2010 [facility] Patient Information 2015  [place], [place]. This information is not intended to replace advice given to you by your health care provider. Make sure you discuss any questions you have with your health care provider.   Abdominal Pain, Women Abdominal (stomach, pelvic, or belly) pain can be caused by many things. It is important to tell your doctor:  The location of the pain.  Does it come and go or is it present all the time?  Are there things that start the pain (eating certain foods, exercise)?  Are there other symptoms associated with the pain (fever, nausea, vomiting, diarrhea)? All of this is helpful to know when trying to find the cause of the pain. CAUSES   Stomach: virus or bacteria infection, or ulcer.  Intestine: appendicitis (inflamed appendix), regional ileitis (Crohn's disease), ulcerative colitis (inflamed colon), irritable bowel syndrome, diverticulitis (inflamed diverticulum of the colon), or cancer of the stomach or intestine.  Gallbladder disease or stones in the gallbladder.  Kidney disease, kidney stones, or infection.  Pancreas infection or cancer.  Fibromyalgia (pain disorder).  Diseases of the female organs:  Uterus: fibroid (non-cancerous) tumors or infection.  Fallopian tubes: infection or tubal pregnancy.  Ovary: cysts or tumors.  Pelvic adhesions (scar tissue).  Endometriosis (uterus lining tissue growing in the pelvis and on the pelvic organs).  Pelvic congestion syndrome (female organs filling up with blood just before the menstrual period).  Pain with the menstrual period.  Pain with ovulation (producing an egg).  Pain with an IUD (intrauterine device, birth control) in the uterus.  Cancer of the female organs.  Functional pain (pain not caused by a disease, may improve  without treatment).  Psychological pain.  Depression. DIAGNOSIS  Your doctor will decide the seriousness of your pain by doing an examination.  Blood tests.  X-rays.  Ultrasound.  CT scan  (computed tomography, special type of X-ray).  MRI (magnetic resonance imaging).  Cultures, for infection.  Barium enema (dye inserted in the large intestine, to better view it with X-rays).  Colonoscopy (looking in intestine with a lighted tube).  Laparoscopy (minor surgery, looking in abdomen with a lighted tube).  Major abdominal exploratory surgery (looking in abdomen with a large incision). TREATMENT  The treatment will depend on the cause of the pain.   Many cases can be observed and treated at home.  Over-the-counter medicines recommended by your caregiver.  Prescription medicine.  Antibiotics, for infection.  Birth control pills, for painful periods or for ovulation pain.  Hormone treatment, for endometriosis.  Nerve blocking injections.  Physical therapy.  Antidepressants.  Counseling with a psychologist or psychiatrist.  Minor or major surgery. HOME CARE INSTRUCTIONS   Do not take laxatives, unless directed by your caregiver.  Take over-the-counter pain medicine only if ordered by your caregiver. Do not take aspirin because it can cause an upset stomach or bleeding.  Try a clear liquid diet (broth or water) as ordered by your caregiver. Slowly move to a bland diet, as tolerated, if the pain is related to the stomach or intestine.  Have a thermometer and take your temperature several times a day, and record it.  Bed rest and sleep, if it helps the pain.  Avoid sexual intercourse, if it causes pain.  Avoid stressful situations.  Keep your follow-up appointments and tests, as your caregiver orders.  If the pain does not go away with medicine or surgery, you may try:  Acupuncture.  Relaxation exercises (yoga, meditation).  Group therapy.  Counseling. SEEK MEDICAL CARE IF:   You notice certain foods cause stomach pain.  Your home care treatment is not helping your pain.  You need stronger pain medicine.  You want your IUD removed.  You  feel faint or lightheaded.  You develop nausea and vomiting.  You develop a rash.  You are having side effects or an allergy to your medicine. SEEK IMMEDIATE MEDICAL CARE IF:   Your pain does not go away or gets worse.  You have a fever.  Your pain is felt only in portions of the abdomen. The right side could possibly be appendicitis. The left lower portion of the abdomen could be colitis or diverticulitis.  You are passing blood in your stools (bright red or black tarry stools, with or without vomiting).  You have blood in your urine.  You develop chills, with or without a fever.  You pass out. MAKE SURE YOU:   Understand these instructions.  Will watch your condition.  Will get help right away if you are not doing well or get worse. Document Released: 01/08/2007 Document Revised: 07/28/2013 Document Reviewed: 01/28/2009 [facility] Patient Information 2015 [place], [place]. This information is not intended to replace advice given to you by your health care provider. Make sure you discuss any questions you have with your health care provider.

## 2014-06-05 NOTE — ED Notes (Signed)
Pt c/o abd pain with urinary frequency, burning, itching of eyes.  Pt c/o swelling to bil ankles and pain in bottom of feet.  Onset 1 week ago

## 2014-06-06 NOTE — ED Notes (Signed)
Pt has ripped up her prescription.

## 2014-06-10 ENCOUNTER — Ambulatory Visit: Payer: Self-pay | Admitting: Family Medicine

## 2014-06-11 ENCOUNTER — Ambulatory Visit: Payer: BC Managed Care – PPO | Attending: Family Medicine | Admitting: Family Medicine

## 2014-06-11 ENCOUNTER — Encounter: Payer: Self-pay | Admitting: Family Medicine

## 2014-06-11 VITALS — BP 151/76 | HR 62 | Temp 97.9°F | Resp 16 | Ht 65.0 in | Wt 156.0 lb

## 2014-06-11 DIAGNOSIS — M25473 Effusion, unspecified ankle: Secondary | ICD-10-CM | POA: Diagnosis not present

## 2014-06-11 DIAGNOSIS — R198 Other specified symptoms and signs involving the digestive system and abdomen: Secondary | ICD-10-CM | POA: Insufficient documentation

## 2014-06-11 DIAGNOSIS — Z0000 Encounter for general adult medical examination without abnormal findings: Secondary | ICD-10-CM | POA: Diagnosis not present

## 2014-06-11 DIAGNOSIS — H578 Other specified disorders of eye and adnexa: Secondary | ICD-10-CM | POA: Insufficient documentation

## 2014-06-11 DIAGNOSIS — B351 Tinea unguium: Secondary | ICD-10-CM

## 2014-06-11 DIAGNOSIS — Z114 Encounter for screening for human immunodeficiency virus [HIV]: Secondary | ICD-10-CM

## 2014-06-11 DIAGNOSIS — M79676 Pain in unspecified toe(s): Secondary | ICD-10-CM

## 2014-06-11 DIAGNOSIS — R350 Frequency of micturition: Secondary | ICD-10-CM | POA: Insufficient documentation

## 2014-06-11 DIAGNOSIS — R1912 Hyperactive bowel sounds: Secondary | ICD-10-CM | POA: Insufficient documentation

## 2014-06-11 DIAGNOSIS — M7989 Other specified soft tissue disorders: Secondary | ICD-10-CM | POA: Diagnosis not present

## 2014-06-11 DIAGNOSIS — H1013 Acute atopic conjunctivitis, bilateral: Secondary | ICD-10-CM | POA: Insufficient documentation

## 2014-06-11 DIAGNOSIS — Z87891 Personal history of nicotine dependence: Secondary | ICD-10-CM | POA: Insufficient documentation

## 2014-06-11 DIAGNOSIS — J309 Allergic rhinitis, unspecified: Secondary | ICD-10-CM

## 2014-06-11 LAB — URIC ACID: Uric Acid, Serum: 6 mg/dL (ref 2.4–7.0)

## 2014-06-11 LAB — POCT GLYCOSYLATED HEMOGLOBIN (HGB A1C): HEMOGLOBIN A1C: 5.6

## 2014-06-11 MED ORDER — LORATADINE 10 MG PO TABS: 10.0000 mg | ORAL_TABLET | Freq: Every day | ORAL | Status: DC | PRN

## 2014-06-11 MED ORDER — TERBINAFINE HCL 250 MG PO TABS: 250.0000 mg | ORAL_TABLET | Freq: Every day | ORAL | Status: DC

## 2014-06-11 MED ORDER — MOMETASONE FUROATE 50 MCG/ACT NA SUSP: 2.0000 | Freq: Every day | NASAL | Status: DC

## 2014-06-11 NOTE — Patient Instructions (Addendum)
[name],  1. Urinary frequency: I suspect overactive bladder Checking A1c to confirm that there is no diabetes  Patient information: Pelvic muscle (Kegel) exercises (The Basics)  What are pelvic muscle exercises? - Pelvic muscle exercises are exercises that strengthen the muscles that control the flow of urine and bowel movements. These exercises are also known as "Kegel" exercises. They can help keep you from leaking urine, gas, or bowel movements, if leaks are  How do I learn how to do Kegel exercises? - First ask your doctor or nurse how to do them right. He or she can help you get started. You will need to learn which muscles to tighten. It is sometimes hard to figure out the right muscles.  A woman might learn to do Kegel exercises by:  ?Putting a finger inside her vagina and squeezing the muscles around her finger; or  ?Pretending that she is sitting on a marble and has to pick up the marble using her vagina A man might learn to do Kegels by tightening his butt muscles as if he were holding back gas.  Both men and women can also learn to do Kegel exercises by stopping and starting the flow of urine. If you do this, make sure to do this only once or twice to figure out the correct muscles. Some doctors think you should not do this at all, because if you get in the habit of doing it, it could cause a bladder infection.  No matter how you learn to do Kegel exercises, the important thing to know is that the muscles involved are not in your belly or your thighs.  After you learn which muscles to tighten, you can do the exercises in any position (sitting in a chair or lying down). You do not need to do them while you are in the bathroom.   How often should I do the exercises? - Do the exercises 3 times a day, on 3 or 4 days a week. Each time, flex your muscles 8 to 12 times, and hold them tight for 6 to 8 seconds each time you tighten. Keep up this routine for at least 3 to 4 months. You will  probably notice results, but it might take a little time.  How do Kegel exercises help? - Kegel exercises can help: ?Reduce urine leaks in people who have "stress incontinence," which means they leak urine when they cough, laugh, sneeze, or strain ?Control sudden urges to urinate that happen to people with "urgency incontinence." (Urgency incontinence is also known as urge incontinence.) ?Control the release of gas or bowel movements ?Reduce pressure or bulging in the vagina caused by pelvic organ prolapse. (If you have a bulge in the vagina, see your doctor or nurse to find out the cause.)   2. Swollen ankles w/o pain Continue BP medicine to control BP to goal of < 150/90 Checking uric acid to rule out gout   3. Increased bowel sounds: w/o pain, constipation or vomiting Referral to GI for screening colonoscopy   4. Red eyes and swollen nose- allergic rhinoconjunctivitis  claritin daily nasonex daily   5. Painful toenail fungus: lamisil x 12 weeks   Call if 4 weeks if still having urinary frequency I will refer to urology. F/u in 4 weeks with RN for BP check  F/u with me in 3 months   Dr. Adrian Blackwater

## 2014-06-11 NOTE — Progress Notes (Signed)
ED F/U  Complaining of redness on both eye  Swelling and burning on feet x 3 weeks

## 2014-06-11 NOTE — Progress Notes (Signed)
   Subjective:    Patient ID: [name], female    DOB: [birth year], 64 y.o.   MRN: [id number] CC: eye redness, swelling and burning in feet, urinary frequency, increased bowel sounds  HPI  1. Eye redness: mild. None now. No itching.   2. Swelling in ankles: medial ankles. No pain. In ankles. No injury. No redness  3. Increased bowel sounds: no pain. N/V/diarrhea.   4. Urinary frequency: no dysuria. No hematuria. Urinates 10 times per day and 2-3 times per night.   Soc Hx: former smoker, quit  Review of Systems As per HPI     Objective:   Physical Exam BP 151/76 mmHg  Pulse 62  Temp(Src) 97.9 F (36.6 C) (Oral)  Resp 16  Ht 5\' 5"  (1.651 m)  Wt 156 lb (70.761 kg)  BMI 25.96 kg/m2  SpO2 100% General appearance: alert, cooperative and no distress  Eyes: PERRLA, normal conjunctiva  Lungs: clear to auscultation bilaterally Heart: regular rate and rhythm, S1, S2 normal, no murmur, click, rub or gallop Abdomen: soft, non-tender; bowel sounds increased; no masses,  no organomegaly,  Extremities: extremities normal, atraumatic, no cyanosis or edema thickened and yellow toenails     Assessment & Plan:

## 2014-06-12 LAB — HIV ANTIBODY (ROUTINE TESTING W REFLEX): HIV 1&2 Ab, 4th Generation: NONREACTIVE

## 2014-06-12 NOTE — Assessment & Plan Note (Signed)
Painful toenail fungus: lamisil x 12 weeks

## 2014-06-12 NOTE — Assessment & Plan Note (Signed)
Red eyes and swollen nose- allergic rhinoconjunctivitis  claritin daily nasonex daily

## 2014-06-12 NOTE — Assessment & Plan Note (Signed)
Screening for HIV.

## 2014-06-12 NOTE — Assessment & Plan Note (Signed)
Swollen ankles w/o pain Continue BP medicine to control BP to goal of < 150/90 Checking uric acid to rule out gout

## 2014-06-12 NOTE — Assessment & Plan Note (Signed)
Increased bowel sounds: w/o pain, constipation or vomiting Referral to GI for screening colonoscopy

## 2014-06-12 NOTE — Assessment & Plan Note (Signed)
1. Urinary frequency: I suspect overactive bladder Checking A1c to confirm that there is no diabetes

## 2014-06-17 ENCOUNTER — Telehealth: Payer: Self-pay | Admitting: *Deleted

## 2014-06-17 NOTE — Telephone Encounter (Signed)
-----   Message from Boykin Nearing, MD sent at 06/12/2014  9:39 AM EDT ----- Screening HIV negative Uric acid normal

## 2014-07-07 ENCOUNTER — Ambulatory Visit: Payer: BC Managed Care – PPO | Attending: Family Medicine | Admitting: *Deleted

## 2014-07-07 VITALS — BP 133/65 | HR 59 | Temp 98.1°F | Resp 16

## 2014-07-07 DIAGNOSIS — I10 Essential (primary) hypertension: Secondary | ICD-10-CM | POA: Diagnosis not present

## 2014-07-07 DIAGNOSIS — Z0130 Encounter for examination of blood pressure without abnormal findings: Secondary | ICD-10-CM

## 2014-07-07 DIAGNOSIS — Z87891 Personal history of nicotine dependence: Secondary | ICD-10-CM | POA: Diagnosis not present

## 2014-07-07 NOTE — Progress Notes (Signed)
Patient presents for BP check Med list reviewed; states taking all meds as directed Patient is not adding salt to foods or cooking with salt, and using Mrs Deliah Boston as alternative to salt.  Patient walking 1 mile per day 2 days per week for exercise Patient denies headaches, blurred vision, SHOB, chest pain or pressure States urinary frequency is less. Continues to do kegel exercises  BP 133/65 P 59  R 16  T  98.1 oral SPO2 99%  Patient advised to call for med refills at least 7 days before running out so as not to go without.  Patient aware that she is to f/u with PCP 3 months from last visit (Due 09/11/14)

## 2014-07-08 ENCOUNTER — Encounter: Payer: Self-pay | Admitting: Family Medicine

## 2014-07-08 DIAGNOSIS — M1711 Unilateral primary osteoarthritis, right knee: Secondary | ICD-10-CM | POA: Insufficient documentation

## 2014-07-08 DIAGNOSIS — M170 Bilateral primary osteoarthritis of knee: Secondary | ICD-10-CM | POA: Insufficient documentation

## 2014-07-08 DIAGNOSIS — M1712 Unilateral primary osteoarthritis, left knee: Secondary | ICD-10-CM | POA: Insufficient documentation

## 2014-07-08 DIAGNOSIS — M47816 Spondylosis without myelopathy or radiculopathy, lumbar region: Secondary | ICD-10-CM | POA: Insufficient documentation

## 2014-07-13 ENCOUNTER — Encounter: Payer: Self-pay | Admitting: Gastroenterology

## 2014-07-20 ENCOUNTER — Encounter: Payer: Self-pay | Admitting: Gastroenterology

## 2014-09-05 ENCOUNTER — Encounter (HOSPITAL_COMMUNITY): Payer: Self-pay | Admitting: Emergency Medicine

## 2014-09-05 ENCOUNTER — Emergency Department (HOSPITAL_COMMUNITY)
Admission: EM | Admit: 2014-09-05 | Discharge: 2014-09-05 | Disposition: A | Discharge status: Home / Self Care | Payer: BC Managed Care – PPO | Attending: Emergency Medicine | Admitting: Emergency Medicine

## 2014-09-05 ENCOUNTER — Emergency Department (HOSPITAL_COMMUNITY): Payer: BC Managed Care – PPO

## 2014-09-05 DIAGNOSIS — Z87828 Personal history of other (healed) physical injury and trauma: Secondary | ICD-10-CM | POA: Insufficient documentation

## 2014-09-05 DIAGNOSIS — Z91040 Latex allergy status: Secondary | ICD-10-CM | POA: Diagnosis not present

## 2014-09-05 DIAGNOSIS — Z9851 Tubal ligation status: Secondary | ICD-10-CM | POA: Insufficient documentation

## 2014-09-05 DIAGNOSIS — Z87891 Personal history of nicotine dependence: Secondary | ICD-10-CM | POA: Insufficient documentation

## 2014-09-05 DIAGNOSIS — Z79899 Other long term (current) drug therapy: Secondary | ICD-10-CM | POA: Insufficient documentation

## 2014-09-05 DIAGNOSIS — R011 Cardiac murmur, unspecified: Secondary | ICD-10-CM | POA: Diagnosis not present

## 2014-09-05 DIAGNOSIS — Z9889 Other specified postprocedural states: Secondary | ICD-10-CM | POA: Diagnosis not present

## 2014-09-05 DIAGNOSIS — I10 Essential (primary) hypertension: Secondary | ICD-10-CM | POA: Diagnosis not present

## 2014-09-05 DIAGNOSIS — M545 Low back pain, unspecified: Secondary | ICD-10-CM

## 2014-09-05 DIAGNOSIS — R1033 Periumbilical pain: Secondary | ICD-10-CM | POA: Diagnosis not present

## 2014-09-05 DIAGNOSIS — Z7982 Long term (current) use of aspirin: Secondary | ICD-10-CM | POA: Insufficient documentation

## 2014-09-05 DIAGNOSIS — Z880 Allergy status to penicillin: Secondary | ICD-10-CM | POA: Insufficient documentation

## 2014-09-05 DIAGNOSIS — M1990 Unspecified osteoarthritis, unspecified site: Secondary | ICD-10-CM | POA: Insufficient documentation

## 2014-09-05 DIAGNOSIS — R1013 Epigastric pain: Secondary | ICD-10-CM | POA: Diagnosis present

## 2014-09-05 LAB — COMPREHENSIVE METABOLIC PANEL
ALBUMIN: 3.8 g/dL (ref 3.5–5.0)
ALK PHOS: 89 U/L (ref 38–126)
ALT: 19 U/L (ref 14–54)
ANION GAP: 9 (ref 5–15)
AST: 23 U/L (ref 15–41)
BUN: 7 mg/dL (ref 6–20)
CO2: 26 mmol/L (ref 22–32)
CREATININE: 0.85 mg/dL (ref 0.44–1.00)
Calcium: 9.2 mg/dL (ref 8.9–10.3)
Chloride: 105 mmol/L (ref 101–111)
GFR calc non Af Amer: >60 mL/min (ref >60)
GLUCOSE: 101 mg/dL — AB (ref 65–99)
Potassium: 3.2 mmol/L — ABNORMAL LOW (ref 3.5–5.1)
SODIUM: 140 mmol/L (ref 135–145)
Total Bilirubin: 0.5 mg/dL (ref 0.3–1.2)
Total Protein: 7.2 g/dL (ref 6.5–8.1)

## 2014-09-05 LAB — URINALYSIS, ROUTINE W REFLEX MICROSCOPIC
Bilirubin Urine: NEGATIVE
Glucose, UA: NEGATIVE mg/dL
HGB URINE DIPSTICK: NEGATIVE
KETONES UR: NEGATIVE mg/dL
LEUKOCYTES UA: NEGATIVE
Nitrite: NEGATIVE
Protein, ur: NEGATIVE mg/dL
SPECIFIC GRAVITY, URINE: 1.014 (ref 1.005–1.030)
UROBILINOGEN UA: 0.2 mg/dL (ref 0.0–1.0)
pH: 5 (ref 5.0–8.0)

## 2014-09-05 LAB — CBC WITH DIFFERENTIAL/PLATELET
Basophils Absolute: 0 10*3/uL (ref 0.0–0.1)
Basophils Relative: 0 % (ref 0–1)
EOS ABS: 0.2 10*3/uL (ref 0.0–0.7)
Eosinophils Relative: 2 % (ref 0–5)
HCT: 38.1 % (ref 36.0–46.0)
Hemoglobin: 12.5 g/dL (ref 12.0–15.0)
Lymphocytes Relative: 31 % (ref 12–46)
Lymphs Abs: 2.9 10*3/uL (ref 0.7–4.0)
MCH: 31.1 pg (ref 26.0–34.0)
MCHC: 32.8 g/dL (ref 30.0–36.0)
MCV: 94.8 fL (ref 78.0–100.0)
MONO ABS: 0.5 10*3/uL (ref 0.1–1.0)
Monocytes Relative: 5 % (ref 3–12)
NEUTROS PCT: 62 % (ref 43–77)
Neutro Abs: 5.8 10*3/uL (ref 1.7–7.7)
Platelets: 280 10*3/uL (ref 150–400)
RBC: 4.02 MIL/uL (ref 3.87–5.11)
RDW: 14.4 % (ref 11.5–15.5)
WBC: 9.5 10*3/uL (ref 4.0–10.5)

## 2014-09-05 MED ORDER — IBUPROFEN 400 MG PO TABS
600.0000 mg | ORAL_TABLET | Freq: Once | ORAL | Status: AC
  Administered 2014-09-05: 600 mg via ORAL
  Filled 2014-09-05 (×2): qty 1

## 2014-09-05 NOTE — ED Provider Notes (Signed)
CSN: [id number]     Arrival date & time 09/05/14  1513 History   First MD Initiated Contact with Patient 09/05/14 1650     Chief Complaint  Patient presents with  . Abdominal Pain    The patient has multiple complaints,  She says her stomach hurts, her mouth hurts and she has swelling in her feet.     (Consider location/radiation/quality/duration/timing/severity/associated sxs/prior Treatment) Patient is a 64 y.o. female presenting with abdominal pain. The history is provided by the patient.  Abdominal Pain Pain location:  Epigastric Pain quality: cramping   Pain radiates to:  Does not radiate Pain severity:  Moderate Onset quality:  Gradual Duration: 6 months. Timing:  Intermittent Progression:  Waxing and waning Chronicity:  Chronic Context: previous surgery (bowel obstruction 2010)   Context: not alcohol use, not recent illness and not recent travel   Relieved by:  Nothing Worsened by:  Nothing tried Ineffective treatments:  None tried Associated symptoms: no anorexia, no chest pain, no cough, no diarrhea, no fever, no hematochezia, no melena, no nausea, no shortness of breath and no vomiting   Risk factors: no alcohol abuse     Past Medical History  Diagnosis Date  . Heart murmur   . Meniscus tear     L KNEE  . Arthritis 2000  . Hypertension 2010  . Abnormal EKG 2012    hospitalized for T wave inversion in lateral leads with [facility] chest pains, normal ECHO, negative trops, no cardiology consult. recent EKG 7/15 stable T wave inversions.    Past Surgical History  Procedure Laterality Date  . Knee surgery Right 1999  . Tubal ligation  1979  . Abdominal surgery  2010    for bowel blockage  . Knee arthroscopy Left 09/25/2013    Procedure: LEFT KNEE ARTHROSCOPY WITH PARTIAL MEDIAL AND LATERAL  MENISCECTOMY/DEBRIDEMENT/MICRO FRACTURE MEDIAL FEMORAL CONDYLE, REMOVAL OF OSTEOCONDRAL FRAGMENT;  Surgeon: Johnn Hai, MD;  Location: [facility];  Service: Orthopedics;  Laterality:  Left;   Family History  Problem Relation Age of Onset  . Hypertension Mother   . Heart disease Mother     has a pacemaker   . Heart failure Father   . Hypertension Father   . Cancer Neg Hx    History  Substance Use Topics  . Smoking status: Former Smoker    Quit date: 07/29/2010  . Smokeless tobacco: Never Used  . Alcohol Use: No   OB History    No data available     Review of Systems  Constitutional: Negative for fever.  Respiratory: Negative for cough and shortness of breath.   Cardiovascular: Negative for chest pain.  Gastrointestinal: Positive for abdominal pain. Negative for nausea, vomiting, diarrhea, melena, hematochezia and anorexia.  All other systems reviewed and are negative.     Allergies  Shrimp; Latex; and Penicillins  Home Medications   Prior to Admission medications   Medication Sig Start Date End Date Taking? Authorizing Provider  amLODipine (NORVASC) 10 MG tablet Take 1 tablet (10 mg total) by mouth daily. 03/24/14  Yes Boykin Nearing, MD  aspirin EC 81 MG tablet Take 81 mg by mouth daily.   Yes Historical Provider, MD  Aspirin-Acetaminophen-Caffeine (EXCEDRIN PO) Take 1 tablet by mouth 2 (two) times daily as needed (pain).   Yes Historical Provider, MD  labetalol (NORMODYNE) 200 MG tablet Take 1 tablet (200 mg total) by mouth 2 (two) times daily. 04/27/14  Yes Boykin Nearing, MD  Multiple Vitamin (MULITIVITAMIN WITH MINERALS) TABS  Take 1 tablet by mouth daily.   Yes Historical Provider, MD  PRESCRIPTION MEDICATION Take by mouth See admin instructions. Pt is required to take antibiotic prior to any dental work.   Yes Historical Provider, MD   BP 151/66 mmHg  Pulse 64  Temp(Src) 98.2 F (36.8 C) (Oral)  Resp 16  SpO2 99% Physical Exam  Constitutional: She is oriented to person, place, and time. She appears well-developed and well-nourished. No distress.  HENT:  Head: Normocephalic.  Eyes: Conjunctivae are normal.  Neck: Neck supple. No  tracheal deviation present.  Cardiovascular: Normal rate and regular rhythm.   Pulmonary/Chest: Effort normal. No respiratory distress.  Abdominal: Soft. She exhibits no distension.  Neurological: She is alert and oriented to person, place, and time.  Skin: Skin is warm and dry.  Psychiatric: She has a normal mood and affect.    ED Course  Procedures (including critical care time) Labs Review Labs Reviewed  COMPREHENSIVE METABOLIC PANEL - Abnormal; Notable for the following:    Potassium 3.2 (*)    Glucose, Bld 101 (*)    All other components within normal limits  CBC WITH DIFFERENTIAL/PLATELET  URINALYSIS, ROUTINE W REFLEX MICROSCOPIC (NOT AT [facility])    Imaging Review Dg Abd 1 View  09/05/2014   CLINICAL DATA:  Subacute abdominal pain.  EXAM: ABDOMEN - 1 VIEW  COMPARISON:  01/13/2014 CT and prior studies  FINDINGS: Bowel gas pattern is unremarkable.  No dilated bowel loops are identified.  A severe lumbar scoliosis and degenerative disc disease again noted.  No suspicious calcifications are identified. Calcified fibroid within the pelvis is noted.  IMPRESSION: No acute abnormalities.  Unremarkable bowel gas pattern.   Electronically Signed   By: Margarette Canada M.D.   On: 09/05/2014 18:06   I independently viewed and interpreted the above radiology studies and agree with radiologist report.    EKG Interpretation None      MDM   Final diagnoses:  Periumbilical abdominal pain  Bilateral low back pain without sciatica    64 year old female presents with intermittent periumbilical abdominal pain that has been ongoing for 6 months for which she has been seen multiple times. She complains that pain has not stopped and she also has some soreness in her lower back. I suspect musculoskeletal pain in the lower back, presentation is not consistent with appendicitis, pancreatitis, diverticulitis, or acute cholecystitis. The patient has no leukocytosis, she is not febrile, and otherwise  well-appearing and has a pending appointment with the health and wellness [facility]. The patient has a remote history of small bowel distraction had an insidious onset previously so screening x-ray was performed to evaluate for obstructive pattern and none was identified. Plan to follow up with PCP as needed and return precautions discussed for worsening or new concerning symptoms.   Leo Grosser, MD 09/05/14 Vernelle Emerald  Noemi Chapel, MD 09/07/14 [phone]

## 2014-09-05 NOTE — ED Notes (Signed)
The patient has multiple complaints,  She says her stomach hurts, her mouth hurts and she has swelling in her feet.  She says she has an appointment with the [facility] and wellness center on June 16th but she says she cannot wait.  She is hurting everywhere and she is urinating fervently.

## 2014-09-05 NOTE — Discharge Instructions (Signed)
Abdominal Pain, Women °Abdominal (stomach, pelvic, or belly) pain can be caused by many things. It is important to tell your doctor: °· The location of the pain. °· Does it come and go or is it present all the time? °· Are there things that start the pain (eating certain foods, exercise)? °· Are there other symptoms associated with the pain (fever, nausea, vomiting, diarrhea)? °All of this is helpful to know when trying to find the cause of the pain. °CAUSES  °· Stomach: virus or bacteria infection, or ulcer. °· Intestine: appendicitis (inflamed appendix), regional ileitis (Crohn's disease), ulcerative colitis (inflamed colon), irritable bowel syndrome, diverticulitis (inflamed diverticulum of the colon), or cancer of the stomach or intestine. °· Gallbladder disease or stones in the gallbladder. °· Kidney disease, kidney stones, or infection. °· Pancreas infection or cancer. °· Fibromyalgia (pain disorder). °· Diseases of the female organs: °¨ Uterus: fibroid (non-cancerous) tumors or infection. °¨ Fallopian tubes: infection or tubal pregnancy. °¨ Ovary: cysts or tumors. °¨ Pelvic adhesions (scar tissue). °¨ Endometriosis (uterus lining tissue growing in the pelvis and on the pelvic organs). °¨ Pelvic congestion syndrome (female organs filling up with blood just before the menstrual period). °¨ Pain with the menstrual period. °¨ Pain with ovulation (producing an egg). °¨ Pain with an IUD (intrauterine device, birth control) in the uterus. °¨ Cancer of the female organs. °· Functional pain (pain not caused by a disease, may improve without treatment). °· Psychological pain. °· Depression. °DIAGNOSIS  °Your doctor will decide the seriousness of your pain by doing an examination. °· Blood tests. °· X-rays. °· Ultrasound. °· CT scan (computed tomography, special type of X-ray). °· MRI (magnetic resonance imaging). °· Cultures, for infection. °· Barium enema (dye inserted in the large intestine, to better view it with  X-rays). °· Colonoscopy (looking in intestine with a lighted tube). °· Laparoscopy (minor surgery, looking in abdomen with a lighted tube). °· Major abdominal exploratory surgery (looking in abdomen with a large incision). °TREATMENT  °The treatment will depend on the cause of the pain.  °· Many cases can be observed and treated at home. °· Over-the-counter medicines recommended by your caregiver. °· Prescription medicine. °· Antibiotics, for infection. °· Birth control pills, for painful periods or for ovulation pain. °· Hormone treatment, for endometriosis. °· Nerve blocking injections. °· Physical therapy. °· Antidepressants. °· Counseling with a psychologist or psychiatrist. °· Minor or major surgery. °HOME CARE INSTRUCTIONS  °· Do not take laxatives, unless directed by your caregiver. °· Take over-the-counter pain medicine only if ordered by your caregiver. Do not take aspirin because it can cause an upset stomach or bleeding. °· Try a clear liquid diet (broth or water) as ordered by your caregiver. Slowly move to a bland diet, as tolerated, if the pain is related to the stomach or intestine. °· Have a thermometer and take your temperature several times a day, and record it. °· Bed rest and sleep, if it helps the pain. °· Avoid sexual intercourse, if it causes pain. °· Avoid stressful situations. °· Keep your follow-up appointments and tests, as your caregiver orders. °· If the pain does not go away with medicine or surgery, you may try: °¨ Acupuncture. °¨ Relaxation exercises (yoga, meditation). °¨ Group therapy. °¨ Counseling. °SEEK MEDICAL CARE IF:  °· You notice certain foods cause stomach pain. °· Your home care treatment is not helping your pain. °· You need stronger pain medicine. °· You want your IUD removed. °· You feel faint or   lightheaded. °· You develop nausea and vomiting. °· You develop a rash. °· You are having side effects or an allergy to your medicine. °SEEK IMMEDIATE MEDICAL CARE IF:  °· Your  pain does not go away or gets worse. °· You have a fever. °· Your pain is felt only in portions of the abdomen. The right side could possibly be appendicitis. The left lower portion of the abdomen could be colitis or diverticulitis. °· You are passing blood in your stools (bright red or black tarry stools, with or without vomiting). °· You have blood in your urine. °· You develop chills, with or without a fever. °· You pass out. °MAKE SURE YOU:  °· Understand these instructions. °· Will watch your condition. °· Will get help right away if you are not doing well or get worse. °Document Released: 01/08/2007 Document Revised: 07/28/2013 Document Reviewed: 01/28/2009 °Exit[employer]® Patient Information ©2015 [employer]. This information is not intended to replace advice given to you by your health care provider. Make sure you discuss any questions you have with your health care provider. ° °

## 2014-09-05 NOTE — ED Provider Notes (Signed)
64 year old female, history of subacute to chronic abdominal discomfort, she reports that it is lower, infraumbilical and lower abdominal. She was seen in October of last year and had a CT scan which showed no intra-abdominal pathology, reports that over the last week it has been intermittent, it does not prevent her from eating though she does have some postprandial pain amount it also appears to be present in the morning. At this time her pain is minimal, on my exam she has a soft heart murmur, systolic, clear lungs, abdomen is soft and nontender, she does not wince or guard, there is no peritoneal signs, no pain at McBurney's point, no right upper quadrant tenderness or Murphy sign. No lower extremity edema, the patient was informed of these results including normal blood work, was informed of her heart murmur which she states she already knew about, is following up with her family doctor on the 16th in 5 days, will give GI referral and start Bentyl, the patient is in agreement with the plan.  I saw and evaluated the patient, reviewed the resident's note and I agree with the findings and plan.   Final diagnoses:  Periumbilical abdominal pain  Bilateral low back pain without sciatica      Noemi Chapel, MD 09/07/14 1234

## 2014-09-08 ENCOUNTER — Encounter: Payer: Self-pay | Admitting: Gastroenterology

## 2014-09-10 ENCOUNTER — Encounter: Payer: Self-pay | Admitting: Family Medicine

## 2014-09-10 ENCOUNTER — Other Ambulatory Visit: Payer: Self-pay

## 2014-09-10 ENCOUNTER — Ambulatory Visit: Payer: BC Managed Care – PPO | Attending: Family Medicine | Admitting: Family Medicine

## 2014-09-10 VITALS — BP 142/71 | HR 64 | Temp 98.4°F | Resp 16 | Ht 65.0 in | Wt 162.0 lb

## 2014-09-10 DIAGNOSIS — I10 Essential (primary) hypertension: Secondary | ICD-10-CM

## 2014-09-10 DIAGNOSIS — Z87891 Personal history of nicotine dependence: Secondary | ICD-10-CM | POA: Insufficient documentation

## 2014-09-10 DIAGNOSIS — M1712 Unilateral primary osteoarthritis, left knee: Secondary | ICD-10-CM | POA: Diagnosis not present

## 2014-09-10 NOTE — Progress Notes (Signed)
   Subjective:    Patient ID: [name], female    DOB: [birth year], 64 y.o.   MRN: [id number] CC: f.u HTN, medical clearance for L knee surgery  HPI  1. CHRONIC HYPERTENSION  Disease Monitoring  Blood pressure range: not checking   Chest pain: no   Dyspnea: no   Claudication: no   Medication compliance: yes  Medication Side Effects  Lightheadedness: no     2. Medical clearance: requesting medical clearance for L knee surgery. Patient has L knee scope last year. Still with pain. No history of anesthesia complications. No bleeding or bruising. No CP or SOB.   Soc Hx: former smoker, quit in 2012  Review of Systems  Constitutional: Negative for fever and chills.  Respiratory: Negative for shortness of breath.   Cardiovascular: Negative for chest pain.  Hematological: Does not bruise/bleed easily.      Objective:   Physical Exam BP 142/71 mmHg  Pulse 64  Temp(Src) 98.4 F (36.9 C) (Oral)  Resp 16  Ht 5\' 5"  (1.651 m)  Wt 162 lb (73.483 kg)  BMI 26.96 kg/m2  SpO2 99%  BP Readings from Last 3 Encounters:  09/10/14 142/71  09/05/14 147/58  07/07/14 133/65  General appearance: alert, cooperative and no distress Lungs: clear to auscultation bilaterally Heart: S1, S2 normal and systolic murmur: early systolic 2/6, blowing throughout the precordium Extremities: extremities normal, atraumatic, no cyanosis or edema  EKG: normal EKG, normal sinus rhythm, unchanged from previous tracings.      Assessment & Plan:

## 2014-09-10 NOTE — Assessment & Plan Note (Signed)
A; planning for L knee surgery. EKG stable. BP improved P; cleared for surgery

## 2014-09-10 NOTE — Progress Notes (Signed)
F/U HTN Stated taking medication as prescribed NO Hx Tobacco  Complaining of ankle swelling

## 2014-09-10 NOTE — Patient Instructions (Signed)
[name],   Thank you for coming in today  1. HTN: BP well controlled  2. Cleared for surgery, see form  F/u in 3 months    Dr. Adrian Blackwater

## 2014-09-10 NOTE — Assessment & Plan Note (Signed)
A; improved control Med: compliant P: continue current regimen

## 2014-09-15 ENCOUNTER — Telehealth: Payer: Self-pay | Admitting: *Deleted

## 2014-09-15 NOTE — Telephone Encounter (Signed)
Dr. Fuller Plan,  This patient is scheduled for direct colonoscopy October 06, 2014. She states she has no previous GI history in terms of screening. It appears she had an ED visit 09/10/14 for abdominal pain with reported history of prior 6 months. Do you feel this patient needs an office visit prior to colon or okay to proceed with direct? If office visit needed, okay with extender or with you?  Thank you, Olivia Mackie

## 2014-09-16 NOTE — Telephone Encounter (Signed)
OK for direct colonoscopy

## 2014-09-17 NOTE — Telephone Encounter (Signed)
To proceed as scheduled

## 2014-09-25 HISTORY — PX: COLONOSCOPY: SHX174

## 2014-09-30 ENCOUNTER — Ambulatory Visit (AMBULATORY_SURGERY_CENTER): Payer: Self-pay

## 2014-09-30 VITALS — Ht 65.0 in | Wt 162.0 lb

## 2014-09-30 DIAGNOSIS — Z1211 Encounter for screening for malignant neoplasm of colon: Secondary | ICD-10-CM

## 2014-09-30 MED ORDER — NA SULFATE-K SULFATE-MG SULF 17.5-3.13-1.6 GM/177ML PO SOLN: 1.0000 | Freq: Once | ORAL | Status: DC

## 2014-09-30 NOTE — Progress Notes (Signed)
Not on home 02 Not on diet drugs No egg or soy allergies No anesthesia complications

## 2014-10-06 ENCOUNTER — Ambulatory Visit (AMBULATORY_SURGERY_CENTER): Payer: Commercial Managed Care - HMO | Admitting: Gastroenterology

## 2014-10-06 ENCOUNTER — Encounter: Payer: Self-pay | Admitting: Gastroenterology

## 2014-10-06 VITALS — BP 154/67 | HR 57 | Temp 96.4°F | Resp 19 | Ht 65.0 in | Wt 162.0 lb

## 2014-10-06 DIAGNOSIS — D125 Benign neoplasm of sigmoid colon: Secondary | ICD-10-CM | POA: Diagnosis not present

## 2014-10-06 DIAGNOSIS — Z1211 Encounter for screening for malignant neoplasm of colon: Secondary | ICD-10-CM | POA: Diagnosis present

## 2014-10-06 DIAGNOSIS — D128 Benign neoplasm of rectum: Secondary | ICD-10-CM

## 2014-10-06 DIAGNOSIS — D120 Benign neoplasm of cecum: Secondary | ICD-10-CM | POA: Diagnosis not present

## 2014-10-06 DIAGNOSIS — D129 Benign neoplasm of anus and anal canal: Secondary | ICD-10-CM | POA: Diagnosis not present

## 2014-10-06 DIAGNOSIS — D122 Benign neoplasm of ascending colon: Secondary | ICD-10-CM | POA: Diagnosis not present

## 2014-10-06 MED ORDER — SODIUM CHLORIDE 0.9 % IV SOLN: 500.0000 mL | INTRAVENOUS | Status: DC

## 2014-10-06 NOTE — Op Note (Signed)
[facility]  [address] [place], [address]   COLONOSCOPY PROCEDURE REPORT PATIENT: [name], [name]  MR#: [id number] BIRTHDATE: 1950-06-19 , 3  yrs. old GENDER: female ENDOSCOPIST: Ladene Artist, MD, [facility] REFERRED [id number], Josalyn PROCEDURE DATE:  10/06/2014 PROCEDURE:   Colonoscopy, screening, Colonoscopy with biopsy, and Colonoscopy with snare polypectomy First Screening Colonoscopy - Avg.  risk and is 50 yrs.  old or older Yes.  Prior Negative Screening - Now for repeat screening. N/A  History of Adenoma - Now for follow-up colonoscopy & has been > or = to 3 yrs.  N/A  Polyps removed today? Yes ASA CLASS:   Class II INDICATIONS:Screening for colonic neoplasia and Colorectal Neoplasm Risk Assessment for this procedure is average risk. MEDICATIONS: Monitored anesthesia care and Propofol 200 mg IV DESCRIPTION OF PROCEDURE:   After the risks benefits and alternatives of the procedure were thoroughly explained, informed consent was obtained.  The digital rectal exam revealed no abnormalities of the rectum.   The LB [id number]-[id number]  endoscope was introduced through the anus and advanced to the cecum, which was identified by both the appendix and ileocecal valve. No adverse events experienced.   The quality of the prep was good.  (Suprep was used)  The instrument was then slowly withdrawn as the colon was fully examined. Estimated blood loss is zero unless otherwise noted in this procedure report.  COLON FINDINGS: Three sessile polyps measuring 6-7 mm in size were found in the sigmoid colon, ascending colon, and at the cecum. Polypectomies were performed with a cold snare.  The resection was complete, the polyp tissue was completely retrieved and sent to histology.   Three sessile polyps measuring 3 mm in size were found in the sigmoid colon.  Polypectomies were performed with cold forceps.  The resection was complete, the polyp tissue was completely  retrieved and sent to histology.   A semi-pedunculated polyp measuring 10 mm in size was found in the rectum.  A polypectomy was performed using snare cautery.   The examination was otherwise normal.  Retroflexed views revealed internal Grade I hemorrhoids. The time to cecum = 1.3 Withdrawal time = 11.2   The scope was withdrawn and the procedure completed. COMPLICATIONS: There were no immediate complications.  ENDOSCOPIC IMPRESSION: 1.   Three sessile polyps in the sigmoid colon, ascending colon, and at the cecum; polypectomies performed with a cold snare 2.   Three sessile polyps in the sigmoid colon; polypectomies performed with cold forceps 3.   Semi-pedunculated polyp in the rectum; polypectomy performed using snare cautery 4.   Grade l internal hemorrhoids  RECOMMENDATIONS: 1.  Hold Aspirin and all other NSAIDS for 2 weeks. 2.  Await pathology results 3.  Repeat colonoscopy in 3 years if largest polyp adenomatous or if 3 or more others adenomatous; 5  years if 1-2 smaller polyps adenomatous; otherwise 10 years  eSigned:  Ladene Artist, MD, [facility] 10/06/2014 11:55 AM

## 2014-10-06 NOTE — Patient Instructions (Signed)
YOU HAD AN ENDOSCOPIC PROCEDURE TODAY AT [facility]:   Refer to the procedure report that was given to you for any specific questions about what was found during the examination.  If the procedure report does not answer your questions, please call your gastroenterologist to clarify.  If you requested that your care partner not be given the details of your procedure findings, then the procedure report has been included in a sealed envelope for you to review at your convenience later.  YOU SHOULD EXPECT: Some feelings of bloating in the abdomen. Passage of more gas than usual.  Walking can help get rid of the air that was put into your GI tract during the procedure and reduce the bloating. If you had a lower endoscopy (such as a colonoscopy or flexible sigmoidoscopy) you may notice spotting of blood in your stool or on the toilet paper. If you underwent a bowel prep for your procedure, you may not have a normal bowel movement for a few days.  Please Note:  You might notice some irritation and congestion in your nose or some drainage.  This is from the oxygen used during your procedure.  There is no need for concern and it should clear up in a day or so.  SYMPTOMS TO REPORT IMMEDIATELY:   Following lower endoscopy (colonoscopy or flexible sigmoidoscopy):  Excessive amounts of blood in the stool  Significant tenderness or worsening of abdominal pains  Swelling of the abdomen that is new, acute  Fever of 100F or higher   For urgent or emergent issues, a gastroenterologist can be reached at any hour by calling [phone].   DIET: Your first meal following the procedure should be a small meal and then it is ok to progress to your normal diet. Heavy or fried foods are harder to digest and may make you feel nauseous or bloated.  Likewise, meals heavy in dairy and vegetables can increase bloating.  Drink plenty of fluids but you should avoid alcoholic beverages for 24  hours.  ACTIVITY:  You should plan to take it easy for the rest of today and you should NOT DRIVE or use heavy machinery until tomorrow (because of the sedation medicines used during the test).    FOLLOW UP: Our staff will call the number listed on your records the next business day following your procedure to check on you and address any questions or concerns that you may have regarding the information given to you following your procedure. If we do not reach you, we will leave a message.  However, if you are feeling well and you are not experiencing any problems, there is no need to return our call.  We will assume that you have returned to your regular daily activities without incident.  If any biopsies were taken you will be contacted by phone or by letter within the next 1-3 weeks.  Please call us at [phone] if you have not heard about the biopsies in 3 weeks.    SIGNATURES/CONFIDENTIALITY: You and/or your care partner have signed paperwork which will be entered into your electronic medical record.  These signatures attest to the fact that that the information above on your After Visit Summary has been reviewed and is understood.  Full responsibility of the confidentiality of this discharge information lies with you and/or your care-partner.  Hold aspirin and all other NSAIDS for 2 weeks Await pathology report Repeat Colonoscopy in 3 years based on pathology report; otherwise 10 years

## 2014-10-06 NOTE — Progress Notes (Signed)
Called to room to assist during endoscopic procedure.  Patient ID and intended procedure confirmed with present staff. Received instructions for my participation in the procedure from the performing physician.  

## 2014-10-06 NOTE — Progress Notes (Signed)
Report to PACU, RN, vss, BBS= Clear.  

## 2014-10-07 ENCOUNTER — Telehealth: Payer: Self-pay | Admitting: *Deleted

## 2014-10-07 NOTE — Telephone Encounter (Signed)
Number identifier, left message, follow-up  

## 2014-10-14 ENCOUNTER — Encounter (HOSPITAL_COMMUNITY): Payer: Self-pay

## 2014-10-14 DIAGNOSIS — M1990 Unspecified osteoarthritis, unspecified site: Secondary | ICD-10-CM | POA: Insufficient documentation

## 2014-10-14 DIAGNOSIS — Z8639 Personal history of other endocrine, nutritional and metabolic disease: Secondary | ICD-10-CM | POA: Insufficient documentation

## 2014-10-14 DIAGNOSIS — K088 Other specified disorders of teeth and supporting structures: Secondary | ICD-10-CM | POA: Insufficient documentation

## 2014-10-14 DIAGNOSIS — Z880 Allergy status to penicillin: Secondary | ICD-10-CM | POA: Diagnosis not present

## 2014-10-14 DIAGNOSIS — Z7982 Long term (current) use of aspirin: Secondary | ICD-10-CM | POA: Diagnosis not present

## 2014-10-14 DIAGNOSIS — Z87828 Personal history of other (healed) physical injury and trauma: Secondary | ICD-10-CM | POA: Diagnosis not present

## 2014-10-14 DIAGNOSIS — M79672 Pain in left foot: Secondary | ICD-10-CM | POA: Insufficient documentation

## 2014-10-14 DIAGNOSIS — Z91040 Latex allergy status: Secondary | ICD-10-CM | POA: Insufficient documentation

## 2014-10-14 DIAGNOSIS — M79671 Pain in right foot: Secondary | ICD-10-CM | POA: Diagnosis not present

## 2014-10-14 DIAGNOSIS — Z79899 Other long term (current) drug therapy: Secondary | ICD-10-CM | POA: Diagnosis not present

## 2014-10-14 DIAGNOSIS — R011 Cardiac murmur, unspecified: Secondary | ICD-10-CM | POA: Insufficient documentation

## 2014-10-14 DIAGNOSIS — I10 Essential (primary) hypertension: Secondary | ICD-10-CM | POA: Diagnosis not present

## 2014-10-14 NOTE — ED Notes (Signed)
Pt reports bilateral foot pain onset Sunday, denies injury or fall. She also is reporting dental pain to her only three front teeth. Nothing she has tried has relieved the pain.

## 2014-10-15 ENCOUNTER — Telehealth: Payer: Self-pay | Admitting: Family Medicine

## 2014-10-15 ENCOUNTER — Emergency Department (HOSPITAL_COMMUNITY)
Admission: EM | Admit: 2014-10-15 | Discharge: 2014-10-15 | Disposition: A | Discharge status: Home / Self Care | Payer: BC Managed Care – PPO | Attending: Emergency Medicine | Admitting: Emergency Medicine

## 2014-10-15 ENCOUNTER — Encounter: Payer: Self-pay | Admitting: Gastroenterology

## 2014-10-15 DIAGNOSIS — M79672 Pain in left foot: Secondary | ICD-10-CM

## 2014-10-15 DIAGNOSIS — K088 Other specified disorders of teeth and supporting structures: Secondary | ICD-10-CM | POA: Diagnosis not present

## 2014-10-15 DIAGNOSIS — M79671 Pain in right foot: Secondary | ICD-10-CM

## 2014-10-15 DIAGNOSIS — K0889 Other specified disorders of teeth and supporting structures: Secondary | ICD-10-CM

## 2014-10-15 MED ORDER — CLINDAMYCIN HCL 150 MG PO CAPS
450.0000 mg | ORAL_CAPSULE | Freq: Once | ORAL | Status: AC
  Administered 2014-10-15: 450 mg via ORAL
  Filled 2014-10-15: qty 3

## 2014-10-15 MED ORDER — TRAMADOL HCL 50 MG PO TABS
50.0000 mg | ORAL_TABLET | Freq: Once | ORAL | Status: AC
  Administered 2014-10-15: 50 mg via ORAL
  Filled 2014-10-15: qty 1

## 2014-10-15 MED ORDER — TRAMADOL HCL 50 MG PO TABS: 50.0000 mg | ORAL_TABLET | Freq: Four times a day (QID) | ORAL | Status: DC | PRN

## 2014-10-15 MED ORDER — CLINDAMYCIN HCL 150 MG PO CAPS: 450.0000 mg | ORAL_CAPSULE | Freq: Three times a day (TID) | ORAL | Status: DC

## 2014-10-15 NOTE — Discharge Instructions (Signed)
Dental Pain  Toothache is pain in or around a tooth. It may get worse with chewing or with cold or heat.   HOME CARE  · Your dentist may use a numbing medicine during treatment. If so, you may need to avoid eating until the medicine wears off. Ask your dentist about this.  · Only take medicine as told by your dentist or doctor.  · Avoid chewing food near the painful tooth until after all treatment is done. Ask your dentist about this.  GET HELP RIGHT AWAY IF:   · The problem gets worse or new problems appear.  · You have a fever.  · There is redness and puffiness (swelling) of the face, jaw, or neck.  · You cannot open your mouth.  · There is pain in the jaw.  · There is very bad pain that is not helped by medicine.  MAKE SURE YOU:   · Understand these instructions.  · Will watch your condition.  · Will get help right away if you are not doing well or get worse.  Document Released: 08/30/2007 Document Revised: 06/05/2011 Document Reviewed: 08/30/2007  [employer]® Patient Information ©2015 [employer]. This information is not intended to replace advice given to you by your health care provider. Make sure you discuss any questions you have with your health care provider.

## 2014-10-15 NOTE — Telephone Encounter (Signed)
Patient called stating that she now has swelling in her ankles and the bottom of her feet hurt. She would like to speak to nurse. Please f/u with pt.

## 2014-10-15 NOTE — ED Provider Notes (Signed)
CSN: [id number]     Arrival date & time 10/14/14  2338 History   First MD Initiated Contact with Patient 10/15/14 0013     Chief Complaint  Patient presents with  . Dental Pain  . Foot Pain     (Consider location/radiation/quality/duration/timing/severity/associated sxs/prior Treatment) HPI   Patient presents with dental pain to the remaining teeth on her left lower jaw which are #23-25, with remaining teeth extracted.  She had increasing pain today but no swelling, drainage, redness.  There is no new acute issue, just continued dental pain with poor dental health.  Patient states that she recently had an extraction, but wished the dentist pulled them all out.  She denies any fever, chills, nausea, vomiting, difficulty speaking or swallowing, has no shortness of breath.   Patient is also complaining of bilateral ankle, feet pain and swelling, which occurred after changing her blood pressure medication, which is managed by [facility].  Chart review states patient is on Norvasc.  She says she woke up this morning with pain on the bottom of her feet, which is made worse with walking.  She said her ankles have been swollen for some time, and she asked her provider at her recent visit, but did not have a workup for it.  She has no difficulty ambulating, although his complaining of increased pain at this time rated 10 out of 10.     Past Medical History  Diagnosis Date  . Meniscus tear     L KNEE  . Arthritis 2000  . Hypertension 2010  . Abnormal EKG 2012    hospitalized for T wave inversion in lateral leads with MSK chest pains, normal ECHO, negative trops, no cardiology consult. recent EKG 7/15 stable T wave inversions.   . Heart murmur   . Hyperlipidemia    Past Surgical History  Procedure Laterality Date  . Knee surgery Right 1999  . Tubal ligation  1979  . Abdominal surgery  2010    for bowel blockage  . Knee arthroscopy Left 09/25/2013    Procedure: LEFT KNEE  ARTHROSCOPY WITH PARTIAL MEDIAL AND LATERAL  MENISCECTOMY/DEBRIDEMENT/MICRO FRACTURE MEDIAL FEMORAL CONDYLE, REMOVAL OF OSTEOCONDRAL FRAGMENT;  Surgeon: Johnn Hai, MD;  Location: [facility];  Service: Orthopedics;  Laterality: Left;   Family History  Problem Relation Age of Onset  . Hypertension Mother   . Heart disease Mother     has a pacemaker   . Heart failure Father   . Hypertension Father   . Cancer Neg Hx   . Colon cancer Neg Hx    History  Substance Use Topics  . Smoking status: Former Smoker    Quit date: 07/29/2010  . Smokeless tobacco: Never Used  . Alcohol Use: No   OB History    No data available     Review of Systems  Constitutional: Negative.   HENT: Negative.  Negative for mouth sores, sore throat and trouble swallowing.   Respiratory: Negative.   Cardiovascular: Negative.   Gastrointestinal: Negative.   Musculoskeletal: Positive for joint swelling and arthralgias. Negative for gait problem.  Neurological: Negative.       Allergies  Shrimp; Latex; and Penicillins  Home Medications   Prior to Admission medications   Medication Sig Start Date End Date Taking? Authorizing Provider  amLODipine (NORVASC) 10 MG tablet Take 1 tablet (10 mg total) by mouth daily. 03/24/14   Boykin Nearing, MD  aspirin EC 81 MG tablet Take 81 mg by mouth  daily.    Historical Provider, MD  Aspirin-Acetaminophen-Caffeine (EXCEDRIN PO) Take 1 tablet by mouth 2 (two) times daily as needed (pain).    Historical Provider, MD  labetalol (NORMODYNE) 200 MG tablet Take 1 tablet (200 mg total) by mouth 2 (two) times daily. 04/27/14   Boykin Nearing, MD  Multiple Vitamin (MULITIVITAMIN WITH MINERALS) TABS Take 1 tablet by mouth daily.    Historical Provider, MD  PRESCRIPTION MEDICATION Take by mouth See admin instructions. Pt is required to take antibiotic prior to any dental work.    Historical Provider, MD   BP 166/72 mmHg  Pulse 73  Temp(Src) 97.4 F (36.3 C) (Oral)  Resp 18  Ht  5\' 5"  (1.651 m)  Wt 162 lb 7 oz (73.681 kg)  BMI 27.03 kg/m2  SpO2 97% Physical Exam  Constitutional: She is oriented to person, place, and time. She appears well-developed and well-nourished. No distress.  Chronically ill appearing female appears older than stated age, in no acute distress, sleeping in ER bed with the lights out  HENT:  Head: Normocephalic and atraumatic.  Right Ear: External ear normal.  Left Ear: External ear normal.  Nose: Nose normal.  Mouth/Throat: Oropharynx is clear and moist. No oropharyngeal exudate.  Poor dentition of tooth #23, 24 and 25, no oral mucosa or gingival edema, erythema or area of fluctuance. No sublingual tenderness, no trismus  Eyes: Conjunctivae and EOM are normal. Pupils are equal, round, and reactive to light. Right eye exhibits no discharge. Left eye exhibits no discharge. No scleral icterus.  Neck: Normal range of motion. No JVD present. No tracheal deviation present. No thyromegaly present.  Cardiovascular: Normal rate, regular rhythm, normal heart sounds and intact distal pulses.  Exam reveals no gallop, no friction rub and no decreased pulses.   No murmur heard. Normal dorsal pedis pulses bilaterally Normal cap refill throughout  Pulmonary/Chest: Effort normal and breath sounds normal. No respiratory distress. She has no wheezes. She has no rales. She exhibits no tenderness.  Abdominal: Soft. Bowel sounds are normal. She exhibits no distension and no mass. There is no tenderness. There is no rebound and no guarding.  Musculoskeletal: Normal range of motion. She exhibits no edema or tenderness.       Right ankle: Normal. She exhibits normal range of motion and no swelling. No tenderness. Achilles tendon normal.       Left ankle: Normal. She exhibits normal range of motion and no swelling. No tenderness. Achilles tendon normal.       Right foot: Normal.       Left foot: Normal.  Lymphadenopathy:    She has no cervical adenopathy.   Neurological: She is alert and oriented to person, place, and time. She has normal reflexes. No cranial nerve deficit. She exhibits normal muscle tone. Coordination normal.  Skin: Skin is warm, dry and intact. No rash noted. She is not diaphoretic. No cyanosis or erythema. No pallor. Nails show no clubbing.  No pitting edema bilaterally of lower extremities Plantar aspect of both feet, diffusely red to pink, no visible rash, skin intact  Psychiatric: She has a normal mood and affect. Her behavior is normal. Judgment and thought content normal.  Nursing note and vitals reviewed.   ED Course  Procedures (including critical care time) Labs Review Labs Reviewed - No data to display  Imaging Review No results found.   EKG Interpretation None      MDM   Final diagnoses:  None   Patient with toothache.  No gross abscess.  Exam unconcerning for Ludwig's angina or spread of infection.  Will treat with penicillin and pain medicine.  Urged patient to follow-up with dentist.    Patient complaining of bilateral lower extremity swelling and pain most likely due to change in blood pressure medicines, likely secondary to Norvasc. No visible edema of foot or joint.   Patient was educated about side effects of this medication, encouraged to follow up on concerns with her primary care provider.  Patient still complaining of pain was given a tramadol here in the ER.  She has been observed ambulate without difficulty while here in the ER. She has been sleeping in the room, is afebrile, not tachycardic, and has never appeared to be in any distress.  Patient was given dental resources and clindamycin was tramadol for pain. I'll encourage the patient to follow up on these symptoms with her primary care provider.       Delsa Grana, PA-C 10/15/14 [id number]  Varney Biles, MD 10/15/14 [phone]

## 2014-10-29 ENCOUNTER — Telehealth: Payer: Self-pay | Admitting: Family Medicine

## 2014-10-29 DIAGNOSIS — I10 Essential (primary) hypertension: Secondary | ICD-10-CM

## 2014-10-29 MED ORDER — AMLODIPINE BESYLATE 10 MG PO TABS: 10.0000 mg | ORAL_TABLET | Freq: Every day | ORAL | Status: DC

## 2014-10-29 NOTE — Telephone Encounter (Signed)
Called patient left VM. norvasc refilled  Patient can come in from 5:30 to 7 PM today for podiatry clinic here Advised ice Advised tramadol Advised f/u appt

## 2014-10-29 NOTE — Telephone Encounter (Signed)
Patient is requesting a refill on amLODipine (NORVASC) 10 MG tablet. She is also needing a nurse to call to give her some advise as she was hospitalized end of July and is having burning sensation and pain on feet. Thank you.

## 2014-11-10 ENCOUNTER — Ambulatory Visit: Payer: BC Managed Care – PPO | Attending: Family Medicine | Admitting: Family Medicine

## 2014-11-10 ENCOUNTER — Other Ambulatory Visit: Payer: Self-pay

## 2014-11-10 ENCOUNTER — Encounter: Payer: Self-pay | Admitting: Family Medicine

## 2014-11-10 VITALS — BP 142/78 | HR 66 | Temp 98.5°F | Resp 16 | Ht 65.0 in | Wt 160.0 lb

## 2014-11-10 DIAGNOSIS — K088 Other specified disorders of teeth and supporting structures: Secondary | ICD-10-CM | POA: Insufficient documentation

## 2014-11-10 DIAGNOSIS — M79672 Pain in left foot: Secondary | ICD-10-CM | POA: Diagnosis not present

## 2014-11-10 DIAGNOSIS — M79673 Pain in unspecified foot: Secondary | ICD-10-CM | POA: Diagnosis present

## 2014-11-10 DIAGNOSIS — Z87891 Personal history of nicotine dependence: Secondary | ICD-10-CM | POA: Insufficient documentation

## 2014-11-10 DIAGNOSIS — K0510 Chronic gingivitis, plaque induced: Secondary | ICD-10-CM

## 2014-11-10 DIAGNOSIS — M79671 Pain in right foot: Secondary | ICD-10-CM | POA: Diagnosis not present

## 2014-11-10 DIAGNOSIS — K30 Functional dyspepsia: Secondary | ICD-10-CM | POA: Insufficient documentation

## 2014-11-10 DIAGNOSIS — Z1231 Encounter for screening mammogram for malignant neoplasm of breast: Secondary | ICD-10-CM

## 2014-11-10 LAB — POCT GLYCOSYLATED HEMOGLOBIN (HGB A1C): HEMOGLOBIN A1C: 5.5

## 2014-11-10 LAB — GLUCOSE, POCT (MANUAL RESULT ENTRY): POC Glucose: 94 mg/dl (ref 70–99)

## 2014-11-10 MED ORDER — OMEPRAZOLE 20 MG PO CPDR: 20.0000 mg | DELAYED_RELEASE_CAPSULE | Freq: Two times a day (BID) | ORAL | Status: DC

## 2014-11-10 MED ORDER — TRAMADOL HCL 50 MG PO TABS: 50.0000 mg | ORAL_TABLET | Freq: Three times a day (TID) | ORAL | Status: DC | PRN

## 2014-11-10 NOTE — Assessment & Plan Note (Signed)
Gingivitis:  List of dentist provided  Finish clindamycin if stomach upset does not worsen

## 2014-11-10 NOTE — Assessment & Plan Note (Signed)
Foot pain: w/o lesions or trauma. Normal blood sugar and A1c.  DDx: arthritis or neuropathy  P: tramadol refilled

## 2014-11-10 NOTE — Progress Notes (Signed)
Complaining of burning and pain on feet x 1 month Abdominal burning no nausea, diarrhea and gas, taking Pepto-bismol and not helping BP elevated today, stated been taking medication as prescribed  No Hx tobacco.

## 2014-11-10 NOTE — Progress Notes (Signed)
   Subjective:    Patient ID: [name], female    DOB: 09-28-1950, 64 y.o.   MRN: [id number] CC: burning pain in feet, HTN f/u  HPI  64 yo F presents for f/u visit  1. Burning pain in feet: x 3 weeks. Patient recently had a bad rash on palms and soles that turned out to be a severe form of tinea. This has resolved with antifungal cream. She no longer has rash but has burning sensation on the soles of both feet. No hand involvement. No back or leg pain. She has mild swelling on the outside of both ankles.   2. Dental pain: patient has swelling and gum pain. She has very poor dentition. Just 3 lower teeth left. Taking an extended course of clindamycin prescribed at the ED. She is looking for a dentist who accepts medicaid.   3. GI upset: x 3 weeks. Mil diarrhea and nausea.  With abdominal cramping and burning senssaton. No emesis, fever, blood in stool. On extended course of clindamycin for gingivitis.   Social History  Substance Use Topics  . Smoking status: Former Smoker    Quit date: 07/29/2010  . Smokeless tobacco: Never Used  . Alcohol Use: No   Review of Systems  Constitutional: Negative for fever and chills.  HENT: Positive for dental problem.   Eyes: Negative for visual disturbance.  Respiratory: Negative for shortness of breath.   Cardiovascular: Negative for chest pain.  Gastrointestinal: Positive for nausea and diarrhea. Negative for vomiting, abdominal pain, blood in stool and abdominal distention.  Musculoskeletal: Positive for myalgias. Negative for back pain and arthralgias.  Skin: Negative for rash.  Allergic/Immunologic: Negative for immunocompromised state.  Hematological: Negative for adenopathy. Does not bruise/bleed easily.  Psychiatric/Behavioral: Negative for suicidal ideas and dysphoric mood.       Objective:   Physical Exam  Constitutional: She is oriented to person, place, and time. She appears well-developed and well-nourished. No distress.  HENT:    Mouth/Throat: Oropharynx is clear and moist and mucous membranes are normal. Abnormal dentition. No dental abscesses.    Eyes: Conjunctivae are normal. Pupils are equal, round, and reactive to light.  Cardiovascular: Normal rate, regular rhythm, normal heart sounds and intact distal pulses.   Pulmonary/Chest: Effort normal and breath sounds normal.  Abdominal: Soft. Normal appearance and bowel sounds are normal. She exhibits no distension and no mass. There is no tenderness.  Musculoskeletal: She exhibits no edema.  Neurological: She is alert and oriented to person, place, and time.  Skin: Skin is warm and dry. No rash noted.  Psychiatric: She has a normal mood and affect.  BP 142/78 mmHg  Pulse 66  Temp(Src) 98.5 F (36.9 C) (Oral)  Resp 16  Ht 5\' 5"  (1.651 m)  Wt 160 lb (72.576 kg)  BMI 26.63 kg/m2  SpO2 99%  BP Readings from Last 3 Encounters:  11/10/14 142/78  10/15/14 140/74  10/06/14 154/67   Wt Readings from Last 3 Encounters:  11/10/14 160 lb (72.576 kg)  10/14/14 162 lb 7 oz (73.681 kg)  10/06/14 162 lb (73.483 kg)    Lab Results  Component Value Date   HGBA1C 5.60 06/11/2014  CBG 94  Lab Results  Component Value Date   HGBA1C 5.50 11/10/2014       Assessment & Plan:

## 2014-11-10 NOTE — Patient Instructions (Addendum)
[name],  1. Foot pain: tramadol refilled Blood sugar is normal   2. Gingivitis:  List of dentist provided  Finish clindamycin if stomach upset does not worsen  3. Stomach upset: I suspect the antibiotics are upsetting your stomach If upset worsens stop antibiotics Eat yogurt   F/u in 4 weeks with RN for flu shot  F/u with me in 3 months   Dr. Adrian Blackwater

## 2014-11-10 NOTE — Assessment & Plan Note (Signed)
Stomach upset: I suspect the antibiotics are upsetting your stomach If upset worsens stop antibiotics Eat yogurt  prilosec ordered

## 2014-12-01 ENCOUNTER — Telehealth: Payer: Self-pay | Admitting: Family Medicine

## 2014-12-01 DIAGNOSIS — M79676 Pain in unspecified toe(s): Principal | ICD-10-CM

## 2014-12-01 DIAGNOSIS — B351 Tinea unguium: Secondary | ICD-10-CM

## 2014-12-01 NOTE — Telephone Encounter (Signed)
Patient called requesting a referral for a Foot doctor Please follow up.

## 2014-12-02 ENCOUNTER — Telehealth: Payer: Self-pay | Admitting: Family Medicine

## 2014-12-02 DIAGNOSIS — M1712 Unilateral primary osteoarthritis, left knee: Secondary | ICD-10-CM

## 2014-12-02 NOTE — Telephone Encounter (Signed)
Patient called in regards of a referral for [facility]. Please f/u.

## 2014-12-02 NOTE — Telephone Encounter (Signed)
Referral placed.

## 2014-12-03 ENCOUNTER — Telehealth: Payer: Self-pay | Admitting: Family Medicine

## 2014-12-03 NOTE — Telephone Encounter (Signed)
Patient came in requesting a referral for an Orthopedic.

## 2014-12-03 NOTE — Telephone Encounter (Signed)
Patient presents to the clinic wanting to inquire about the status of her Ins[employer] application. Please follow up with patient.

## 2014-12-04 ENCOUNTER — Telehealth: Payer: Self-pay | Admitting: Clinical

## 2014-12-04 NOTE — Telephone Encounter (Signed)
F/u w pt on her Ensure application- since she has previously participated in the program, she does not qualify to participate in the "Feed the [employer] Nutritional Supplement Study".

## 2014-12-09 NOTE — Telephone Encounter (Signed)
Ortho referral placed, please inform patient

## 2014-12-13 ENCOUNTER — Emergency Department (HOSPITAL_COMMUNITY)
Admission: EM | Admit: 2014-12-13 | Discharge: 2014-12-13 | Disposition: A | Discharge status: Home / Self Care | Payer: Commercial Managed Care - HMO | Attending: Emergency Medicine | Admitting: Emergency Medicine

## 2014-12-13 ENCOUNTER — Encounter (HOSPITAL_COMMUNITY): Payer: Self-pay | Admitting: Emergency Medicine

## 2014-12-13 ENCOUNTER — Emergency Department (HOSPITAL_COMMUNITY): Payer: Commercial Managed Care - HMO

## 2014-12-13 DIAGNOSIS — Z880 Allergy status to penicillin: Secondary | ICD-10-CM | POA: Insufficient documentation

## 2014-12-13 DIAGNOSIS — Z91040 Latex allergy status: Secondary | ICD-10-CM | POA: Diagnosis not present

## 2014-12-13 DIAGNOSIS — M1990 Unspecified osteoarthritis, unspecified site: Secondary | ICD-10-CM | POA: Diagnosis not present

## 2014-12-13 DIAGNOSIS — M79671 Pain in right foot: Secondary | ICD-10-CM | POA: Diagnosis not present

## 2014-12-13 DIAGNOSIS — M722 Plantar fascial fibromatosis: Secondary | ICD-10-CM | POA: Insufficient documentation

## 2014-12-13 DIAGNOSIS — G8929 Other chronic pain: Secondary | ICD-10-CM | POA: Insufficient documentation

## 2014-12-13 DIAGNOSIS — G44229 Chronic tension-type headache, not intractable: Secondary | ICD-10-CM | POA: Diagnosis not present

## 2014-12-13 DIAGNOSIS — K089 Disorder of teeth and supporting structures, unspecified: Secondary | ICD-10-CM

## 2014-12-13 DIAGNOSIS — Z87891 Personal history of nicotine dependence: Secondary | ICD-10-CM | POA: Diagnosis not present

## 2014-12-13 DIAGNOSIS — Z7982 Long term (current) use of aspirin: Secondary | ICD-10-CM | POA: Diagnosis not present

## 2014-12-13 DIAGNOSIS — M79672 Pain in left foot: Secondary | ICD-10-CM | POA: Diagnosis not present

## 2014-12-13 DIAGNOSIS — K088 Other specified disorders of teeth and supporting structures: Secondary | ICD-10-CM | POA: Diagnosis present

## 2014-12-13 DIAGNOSIS — I10 Essential (primary) hypertension: Secondary | ICD-10-CM | POA: Insufficient documentation

## 2014-12-13 DIAGNOSIS — Z79899 Other long term (current) drug therapy: Secondary | ICD-10-CM | POA: Insufficient documentation

## 2014-12-13 DIAGNOSIS — R011 Cardiac murmur, unspecified: Secondary | ICD-10-CM | POA: Insufficient documentation

## 2014-12-13 DIAGNOSIS — Z792 Long term (current) use of antibiotics: Secondary | ICD-10-CM | POA: Diagnosis not present

## 2014-12-13 DIAGNOSIS — R0981 Nasal congestion: Secondary | ICD-10-CM | POA: Diagnosis not present

## 2014-12-13 DIAGNOSIS — Z8639 Personal history of other endocrine, nutritional and metabolic disease: Secondary | ICD-10-CM | POA: Diagnosis not present

## 2014-12-13 MED ORDER — ACETAMINOPHEN 500 MG PO TABS
1000.0000 mg | ORAL_TABLET | Freq: Once | ORAL | Status: AC
  Administered 2014-12-13: 1000 mg via ORAL
  Filled 2014-12-13: qty 2

## 2014-12-13 MED ORDER — IBUPROFEN 800 MG PO TABS
800.0000 mg | ORAL_TABLET | Freq: Once | ORAL | Status: AC
  Administered 2014-12-13: 800 mg via ORAL
  Filled 2014-12-13: qty 1

## 2014-12-13 NOTE — Progress Notes (Signed)
PA-c asked CM to speak to pt re: Dental Care. Pt reports that she has Medicare and her dental services are not covered. Pt reports she had received "dental care in the past and it was "paid for by [facility]" This RNCM noted  dental caries and missing teeth when pt talking. CM gave her self pay list of Dental Providers who may see her for reduced fee in the area and told her to call for appt on Monday 12/14/2014.  This CM will leave facesheet for AM CM to follow up and call pt to assure appropriate arrangements have been made. Unsure of [facility] assisted Dental Care will follow up and make pt aware. Delrae Sawyers, [place]

## 2014-12-13 NOTE — ED Notes (Signed)
MD at bedside. During triage assessment patient c/o right pain radiating down into right foot. Upon MD palpation patient c/o left foot pain.

## 2014-12-13 NOTE — ED Provider Notes (Signed)
CSN: [id number]     Arrival date & time 12/13/14  0818 History   First MD Initiated Contact with Patient 12/13/14 [phone]     Chief Complaint  Patient presents with  . Leg Pain    Pain down right leg  . Facial Pain    Patient states, "I think I have a sinus infection"  . Dental Pain     (Consider location/radiation/quality/duration/timing/severity/associated sxs/prior Treatment) Patient is a 64 y.o. female presenting with general illness. The history is provided by the patient.  Illness Severity:  Mild Onset quality:  Gradual Duration:  2 days Timing:  Constant Progression:  Unchanged Chronicity:  Recurrent Associated symptoms: congestion and myalgias   Associated symptoms: no chest pain, no fever, no headaches, no nausea, no rhinorrhea, no shortness of breath, no vomiting and no wheezing    64 yo F with multiple complaints. Patient states she's been having worsening of her chronic bilateral knee pain. This been an ongoing problem for quite some time. Patient denies any injuries. Worse with range of motion.  Patient also complaining of dental pain. Patient only has 3 teeth remaining. Has not been to see a dentist because she cannot afford it. Denies any fevers or chills denies difficulty with swallowing. Patient is not tried anything for this at home.  Patient also complaining of bilateral foot pain. Pain worse to the third metatarsal. Denies any injury. Denies prolonged standing or walking.  Past Medical History  Diagnosis Date  . Meniscus tear     L KNEE  . Arthritis 2000  . Hypertension 2010  . Abnormal EKG 2012    hospitalized for T wave inversion in lateral leads with [facility] chest pains, normal ECHO, negative trops, no cardiology consult. recent EKG 7/15 stable T wave inversions.   . Heart murmur   . Hyperlipidemia    Past Surgical History  Procedure Laterality Date  . Knee surgery Right 1999  . Tubal ligation  1979  . Abdominal surgery  2010    for bowel blockage  .  Knee arthroscopy Left 09/25/2013    Procedure: LEFT KNEE ARTHROSCOPY WITH PARTIAL MEDIAL AND LATERAL  MENISCECTOMY/DEBRIDEMENT/MICRO FRACTURE MEDIAL FEMORAL CONDYLE, REMOVAL OF OSTEOCONDRAL FRAGMENT;  Surgeon: Johnn Hai, MD;  Location: [facility];  Service: Orthopedics;  Laterality: Left;   Family History  Problem Relation Age of Onset  . Hypertension Mother   . Heart disease Mother     has a pacemaker   . Heart failure Father   . Hypertension Father   . Cancer Neg Hx   . Colon cancer Neg Hx    Social History  Substance Use Topics  . Smoking status: Former Smoker    Quit date: 07/29/2010  . Smokeless tobacco: Never Used  . Alcohol Use: No   OB History    No data available     Review of Systems  Constitutional: Negative for fever and chills.  HENT: Positive for congestion. Negative for rhinorrhea.   Eyes: Negative for redness and visual disturbance.  Respiratory: Negative for shortness of breath and wheezing.   Cardiovascular: Negative for chest pain and palpitations.  Gastrointestinal: Negative for nausea and vomiting.  Genitourinary: Negative for dysuria and urgency.  Musculoskeletal: Positive for myalgias and arthralgias.  Skin: Negative for pallor and wound.  Neurological: Negative for dizziness and headaches.      Allergies  Shrimp; Latex; and Penicillins  Home Medications   Prior to Admission medications   Medication Sig Start Date End Date Taking? Authorizing Provider  amLODipine (NORVASC) 10 MG tablet Take 1 tablet (10 mg total) by mouth daily. 10/29/14   Boykin Nearing, MD  aspirin EC 81 MG tablet Take 81 mg by mouth daily.    Historical Provider, MD  Aspirin-Acetaminophen-Caffeine (EXCEDRIN PO) Take 1 tablet by mouth 2 (two) times daily as needed (pain).    Historical Provider, MD  clindamycin (CLEOCIN) 150 MG capsule Take 3 capsules (450 mg total) by mouth 3 (three) times daily. May dispense as 150mg  capsules 10/15/14   Delsa Grana, PA-C  labetalol  (NORMODYNE) 200 MG tablet Take 1 tablet (200 mg total) by mouth 2 (two) times daily. 04/27/14   Boykin Nearing, MD  Multiple Vitamin (MULITIVITAMIN WITH MINERALS) TABS Take 1 tablet by mouth daily.    Historical Provider, MD  omeprazole (PRILOSEC) 20 MG capsule Take 1 capsule (20 mg total) by mouth 2 (two) times daily before a meal. Take 30 minutes before meals 11/10/14   Josalyn Funches, MD  traMADol (ULTRAM) 50 MG tablet Take 1 tablet (50 mg total) by mouth every 8 (eight) hours as needed. 11/10/14   Josalyn Funches, MD   BP 134/61 mmHg  Pulse 63  Temp(Src) 97.9 F (36.6 C) (Oral)  Resp 16  Ht 5\' 5"  (1.651 m)  Wt 160 lb (72.576 kg)  BMI 26.63 kg/m2  SpO2 100% Physical Exam  Constitutional: She is oriented to person, place, and time. She appears well-developed and well-nourished. No distress.  HENT:  Head: Normocephalic and atraumatic.  No sinus tenderness to palpation  3 remaining teeth to left lower midline without any acute swelling fluctuance.  No sublingual swelling. Tolerating secretions without difficulty.  Eyes: EOM are normal. Pupils are equal, round, and reactive to light.  Neck: Normal range of motion. Neck supple.  Cardiovascular: Normal rate and regular rhythm.  Exam reveals no gallop and no friction rub.   No murmur heard. Pulmonary/Chest: Effort normal. She has no wheezes. She has no rales.  Abdominal: Soft. She exhibits no distension. There is no tenderness. There is no rebound and no guarding.  Musculoskeletal: She exhibits tenderness (tender to palpation about both knee joints. Tender palpation about the third metatarsal). She exhibits no edema.  Neurological: She is alert and oriented to person, place, and time.  Skin: Skin is warm and dry. She is not diaphoretic.  Psychiatric: She has a normal mood and affect. Her behavior is normal.    ED Course  Procedures (including critical care time) Labs Review Labs Reviewed - No data to display  Imaging Review Dg Foot  Complete Left  12/13/2014   CLINICAL DATA:  Pt is having pain on the plantar surface of her left foot, near the third distal metatarsal. No known injury. Hx of arthritis, pt recently had an arthroscopy of the left knee and has had difficulty walking. No Hx of DM  EXAM: LEFT FOOT - COMPLETE 3+ VIEW  COMPARISON:  11/16/2005  FINDINGS: There is no evidence of fracture or dislocation. There is no evidence of arthropathy or other focal bone abnormality. Soft tissues are unremarkable.  IMPRESSION: Negative.   Electronically Signed   By: Lucrezia Europe M.D.   On: 12/13/2014 09:36   I have personally reviewed and evaluated these images and lab results as part of my medical decision-making.   EKG Interpretation None      MDM   Final diagnoses:  Pain in both feet  Plantar fasciitis, bilateral  Chronic dental pain  Chronic tension-type headache, not intractable    64 yo  F with multiple complaints. Discussed at length patient see a dentist for her dental pain. No current signs of infection. Chronic knee pain without injury see no reason for imaging. Patient with pain to the third metatarsal bilaterally. Worst on the left. Will x-ray to rule out occult fracture or possible stress fracture. If negative likely plantar fasciitis.  X-rays negative. Discussed with case manager about patient finding dental follow-up. With no signs of infection will not treat dental pain with antibiotics. Treat conservatively with NSAIDs and Tylenol.    I have discussed the diagnosis/risks/treatment options with the patient and believe the pt to be eligible for discharge home to follow-up with PCP/dentist. We also discussed returning to the ED immediately if new or worsening sx occur. We discussed the sx which are most concerning (e.g., sudden worsening symptoms, fever, inability to swallow) that necessitate immediate return. Medications administered to the patient during their visit and any new prescriptions provided to the patient  are listed below.  Medications given during this visit Medications  ibuprofen (ADVIL,MOTRIN) tablet 800 mg (800 mg Oral Given 12/13/14 0928)  acetaminophen (TYLENOL) tablet 1,000 mg (1,000 mg Oral Given 12/13/14 1031)    Discharge Medication List as of 12/13/2014  9:51 AM       The patient appears reasonably screen and/or stabilized for discharge and I doubt any other medical condition or other [facility] requiring further screening, evaluation, or treatment in the ED at this time prior to discharge.    Deno Etienne, DO 12/13/14 1200

## 2014-12-13 NOTE — Discharge Instructions (Signed)
Dental list          updated 1.22.15 These dentists all accept Medicaid.  The list is for your convenience in choosing your childs dentist. Estos dentistas aceptan Medicaid.  La lista es para su [place] y es una cortesa.     [facility]     [phone] [place] [address] Se habla espaol From 61 to 65 years old Parent may go with child Anette Riedel DDS     [phone] [address] [place]  [address] Se habla espaol From 56 to 6 years old Parent may NOT go with child  Rolene Arbour DMD    [phone] [place] [address] Se habla espaol [place] spoken From 106 years old Parent may go with child [facility]     [phone] [place]. [place] [address] Se habla espaol From 51 to 47 years old Parent may NOT go with child  Marcelo Baldy DDS     [phone] [facility]      [address]  [place] [address] No se habla espaol From teeth coming in Parent may go with child  [facility]     [phone] [address] [place]. [place] [address] Requires certification. Call for information. Requiere certificacin. Llame para informacin. Algunos dias se habla espaol  From birth to 78 years Parent possibly goes with child  Kandice Hams DDS     [place]  [address] [place] [address] Se habla espaol From 18 months to 18 years  Parent may go with child  J. [place] DDS    [place] DDS [address] [place] [address] Se habla espaol From 12 year old Parent may go with child  Shelton Silvas DDS    [phone] [place] [address] Se habla espaol  From 59 months old Parent may go with child Ivory Broad DDS    [phone] [address] [place] [address] Se habla espaol From 24 to 49 years old Parent may go with child  [facility]    [phone] [address] [place] [address] No se habla espaol From birth Parent may not go with child     Dental Pain Toothache is pain in or around a tooth. It may get worse with chewing or with cold or heat.  HOME CARE  Your dentist may use a numbing medicine during treatment. If so, you may need to avoid eating until the medicine wears off. Ask your dentist about this.  Only take medicine as told by your dentist or doctor.  Avoid chewing food near the painful tooth until after all treatment is done. Ask your dentist about this. GET HELP RIGHT AWAY IF:   The problem gets worse or new problems appear.  You have a fever.  There is redness and puffiness (swelling) of the face, jaw, or neck.  You cannot open your mouth.  There is pain in the jaw.  There is very bad pain that is not helped by medicine. MAKE SURE YOU:   Understand these instructions.  Will watch your condition.  Will get help right away if you are not doing well or get worse. Document Released: 08/30/2007 Document Revised: 06/05/2011 Document Reviewed: 08/30/2007 [facility] Patient Information 2015 [place], [place]. This information is not intended to replace advice given to you by your health care provider.  Make sure you discuss any questions you have with your health care provider. ° °

## 2014-12-13 NOTE — ED Notes (Signed)
32 yof c/o leg, facial, and dental pain. Patient states leg pain began 2 days ago. She states its a sharp pain that radiates down her right leg and into her right foot. Patient also c/o facial pain that began 3 days ago. Patient states "There is just a lot of pressure. I think I have a sinus infection". Patient states she did try taking day-quil at home yesterday with little relief. Denies runny nose, sore throat, diarrhea, or nausea. States she does have a cough and headache. Dental pain has been ongoing for 2 months. Patient states her insurance will not cover a dental appointment without a referral so she has not been to the dentist to address this concern.

## 2014-12-22 ENCOUNTER — Other Ambulatory Visit: Payer: Self-pay | Admitting: Family Medicine

## 2014-12-22 DIAGNOSIS — K0510 Chronic gingivitis, plaque induced: Secondary | ICD-10-CM

## 2015-01-05 ENCOUNTER — Ambulatory Visit
Admission: RE | Admit: 2015-01-05 | Discharge: 2015-01-05 | Disposition: A | Discharge status: Home / Self Care | Payer: Commercial Managed Care - HMO | Source: Ambulatory Visit

## 2015-01-05 DIAGNOSIS — Z1231 Encounter for screening mammogram for malignant neoplasm of breast: Secondary | ICD-10-CM

## 2015-01-12 ENCOUNTER — Telehealth: Payer: Self-pay | Admitting: Licensed Clinical Social Worker

## 2015-01-12 NOTE — Telephone Encounter (Signed)
CSW received voicemail from Ms. Joya Gaskins.  CSW returned call, unable to leave message at this time.

## 2015-01-13 ENCOUNTER — Ambulatory Visit: Payer: Commercial Managed Care - HMO | Admitting: Podiatry

## 2015-02-09 ENCOUNTER — Telehealth: Payer: Self-pay | Admitting: Family Medicine

## 2015-02-09 NOTE — Telephone Encounter (Signed)
Pt. Called stating that she was referred to a low cost dentist and the dentist where she was referred is not low cost. Pt. Would like to know if she can be referred to another dentist. Pt. Stated she is in a lot of pain. Please f/u with pt.

## 2015-02-15 NOTE — Telephone Encounter (Signed)
Patient called stating that she is having a lot of gas pain and bowel issues. Patient stated that it might be related to the surgery she had. Please f/u

## 2015-02-15 NOTE — Telephone Encounter (Signed)
Returned pt call. LVM to return call 

## 2015-02-17 ENCOUNTER — Ambulatory Visit: Payer: Commercial Managed Care - HMO | Admitting: Podiatry

## 2015-02-21 ENCOUNTER — Encounter (HOSPITAL_COMMUNITY): Payer: Self-pay | Admitting: Family Medicine

## 2015-02-21 ENCOUNTER — Emergency Department (HOSPITAL_COMMUNITY)
Admission: EM | Admit: 2015-02-21 | Discharge: 2015-02-21 | Disposition: A | Discharge status: Home / Self Care | Payer: Commercial Managed Care - HMO | Attending: Emergency Medicine | Admitting: Emergency Medicine

## 2015-02-21 DIAGNOSIS — M1990 Unspecified osteoarthritis, unspecified site: Secondary | ICD-10-CM | POA: Diagnosis not present

## 2015-02-21 DIAGNOSIS — R1030 Lower abdominal pain, unspecified: Secondary | ICD-10-CM | POA: Diagnosis present

## 2015-02-21 DIAGNOSIS — Z7982 Long term (current) use of aspirin: Secondary | ICD-10-CM | POA: Insufficient documentation

## 2015-02-21 DIAGNOSIS — K0889 Other specified disorders of teeth and supporting structures: Secondary | ICD-10-CM | POA: Insufficient documentation

## 2015-02-21 DIAGNOSIS — R011 Cardiac murmur, unspecified: Secondary | ICD-10-CM | POA: Diagnosis not present

## 2015-02-21 DIAGNOSIS — Z87828 Personal history of other (healed) physical injury and trauma: Secondary | ICD-10-CM | POA: Insufficient documentation

## 2015-02-21 DIAGNOSIS — M2550 Pain in unspecified joint: Secondary | ICD-10-CM | POA: Insufficient documentation

## 2015-02-21 DIAGNOSIS — Z91040 Latex allergy status: Secondary | ICD-10-CM | POA: Insufficient documentation

## 2015-02-21 DIAGNOSIS — Z880 Allergy status to penicillin: Secondary | ICD-10-CM | POA: Diagnosis not present

## 2015-02-21 DIAGNOSIS — Z9851 Tubal ligation status: Secondary | ICD-10-CM | POA: Diagnosis not present

## 2015-02-21 DIAGNOSIS — R109 Unspecified abdominal pain: Secondary | ICD-10-CM

## 2015-02-21 DIAGNOSIS — Z8639 Personal history of other endocrine, nutritional and metabolic disease: Secondary | ICD-10-CM | POA: Diagnosis not present

## 2015-02-21 DIAGNOSIS — I10 Essential (primary) hypertension: Secondary | ICD-10-CM | POA: Insufficient documentation

## 2015-02-21 DIAGNOSIS — K029 Dental caries, unspecified: Secondary | ICD-10-CM | POA: Diagnosis not present

## 2015-02-21 DIAGNOSIS — Z79899 Other long term (current) drug therapy: Secondary | ICD-10-CM | POA: Diagnosis not present

## 2015-02-21 DIAGNOSIS — K5900 Constipation, unspecified: Secondary | ICD-10-CM | POA: Diagnosis not present

## 2015-02-21 DIAGNOSIS — Z87891 Personal history of nicotine dependence: Secondary | ICD-10-CM | POA: Diagnosis not present

## 2015-02-21 LAB — COMPREHENSIVE METABOLIC PANEL
ALBUMIN: 3.4 g/dL — AB (ref 3.5–5.0)
ALT: 15 U/L (ref 14–54)
ANION GAP: 6 (ref 5–15)
AST: 19 U/L (ref 15–41)
Alkaline Phosphatase: 84 U/L (ref 38–126)
BUN: <5 mg/dL — ABNORMAL LOW (ref 6–20)
CO2: 28 mmol/L (ref 22–32)
Calcium: 9.1 mg/dL (ref 8.9–10.3)
Chloride: 106 mmol/L (ref 101–111)
Creatinine, Ser: 0.76 mg/dL (ref 0.44–1.00)
GFR calc non Af Amer: >60 mL/min (ref >60)
Glucose, Bld: 79 mg/dL (ref 65–99)
POTASSIUM: 3.8 mmol/L (ref 3.5–5.1)
Sodium: 140 mmol/L (ref 135–145)
Total Bilirubin: 0.5 mg/dL (ref 0.3–1.2)
Total Protein: 6.5 g/dL (ref 6.5–8.1)

## 2015-02-21 LAB — URINALYSIS, ROUTINE W REFLEX MICROSCOPIC
Bilirubin Urine: NEGATIVE
Glucose, UA: NEGATIVE mg/dL
HGB URINE DIPSTICK: NEGATIVE
KETONES UR: NEGATIVE mg/dL
Nitrite: NEGATIVE
PROTEIN: NEGATIVE mg/dL
Specific Gravity, Urine: 1.018 (ref 1.005–1.030)
pH: 5 (ref 5.0–8.0)

## 2015-02-21 LAB — CBC WITH DIFFERENTIAL/PLATELET
BASOS PCT: 1 %
Basophils Absolute: 0.1 10*3/uL (ref 0.0–0.1)
Eosinophils Absolute: 0.3 10*3/uL (ref 0.0–0.7)
Eosinophils Relative: 3 %
HEMATOCRIT: 36.8 % (ref 36.0–46.0)
HEMOGLOBIN: 11.9 g/dL — AB (ref 12.0–15.0)
LYMPHS PCT: 25 %
Lymphs Abs: 1.8 10*3/uL (ref 0.7–4.0)
MCH: 31.2 pg (ref 26.0–34.0)
MCHC: 32.3 g/dL (ref 30.0–36.0)
MCV: 96.6 fL (ref 78.0–100.0)
MONOS PCT: 5 %
Monocytes Absolute: 0.4 10*3/uL (ref 0.1–1.0)
NEUTROS ABS: 4.8 10*3/uL (ref 1.7–7.7)
Neutrophils Relative %: 66 %
Platelets: 267 10*3/uL (ref 150–400)
RBC: 3.81 MIL/uL — ABNORMAL LOW (ref 3.87–5.11)
RDW: 14 % (ref 11.5–15.5)
WBC: 7.3 10*3/uL (ref 4.0–10.5)

## 2015-02-21 LAB — URINE MICROSCOPIC-ADD ON: RBC / HPF: NONE SEEN RBC/hpf (ref 0–5)

## 2015-02-21 MED ORDER — SIMETHICONE 125 MG PO CAPS: 125.0000 mg | ORAL_CAPSULE | Freq: Four times a day (QID) | ORAL | Status: DC | PRN

## 2015-02-21 MED ORDER — KETOROLAC TROMETHAMINE 30 MG/ML IJ SOLN
60.0000 mg | Freq: Once | INTRAMUSCULAR | Status: AC
  Administered 2015-02-21: 60 mg via INTRAMUSCULAR
  Filled 2015-02-21: qty 2

## 2015-02-21 MED ORDER — ALUMINUM & MAGNESIUM HYDROXIDE 200-200 MG/5ML PO SUSP: 10.0000 mL | Freq: Four times a day (QID) | ORAL | Status: DC | PRN

## 2015-02-21 MED ORDER — POLYETHYLENE GLYCOL 3350 17 G PO PACK: 17.0000 g | PACK | Freq: Every day | ORAL | Status: DC

## 2015-02-21 NOTE — ED Provider Notes (Signed)
CSN: [id number]     Arrival date & time 02/21/15  0725 History   First MD Initiated Contact with Patient 02/21/15 [phone]     Chief Complaint  Patient presents with  . Abdominal Pain  . Dental Pain   (Consider location/radiation/quality/duration/timing/severity/associated sxs/prior Treatment) HPI  Patient is a 64 year old female with history of HTN presenting with abdominal pain and dental pain.  Patient reports that she has had abdominal pain for the past several weeks. Pain is described as a burning sensation that comes and goes in her lower abdomen. Patient reports a prior history of bowel blockage and is concerned that her current pain may be related to that. No nausea or vomiting. No diarrhea. Reports that she has had smaller, hard stools over the past couple of weeks. Patient has not tried anything for the pain. Pain is not made better or worsened with bowel movements. Eating does not affect the pain. No fevers or chills. No dysuria.   Patient also reports several months of dental pain. She was seen in the ED 2 months ago for the same complaint. Reports that she has been trying to get in to see a dentist, but cannot afford it. She has not noticed any drainage.   Past Medical History  Diagnosis Date  . Meniscus tear     L KNEE  . Arthritis 2000  . Hypertension 2010  . Abnormal EKG 2012    hospitalized for T wave inversion in lateral leads with MSK chest pains, normal ECHO, negative trops, no cardiology consult. recent EKG 7/15 stable T wave inversions.   . Heart murmur   . Hyperlipidemia    Past Surgical History  Procedure Laterality Date  . Knee surgery Right 1999  . Tubal ligation  1979  . Abdominal surgery  2010    for bowel blockage  . Knee arthroscopy Left 09/25/2013    Procedure: LEFT KNEE ARTHROSCOPY WITH PARTIAL MEDIAL AND LATERAL  MENISCECTOMY/DEBRIDEMENT/MICRO FRACTURE MEDIAL FEMORAL CONDYLE, REMOVAL OF OSTEOCONDRAL FRAGMENT;  Surgeon: Johnn Hai, MD;  Location: [facility]  [facility];  Service: Orthopedics;  Laterality: Left;   Family History  Problem Relation Age of Onset  . Hypertension Mother   . Heart disease Mother     has a pacemaker   . Heart failure Father   . Hypertension Father   . Cancer Neg Hx   . Colon cancer Neg Hx    Social History  Substance Use Topics  . Smoking status: Former Smoker    Quit date: 07/29/2010  . Smokeless tobacco: Never Used  . Alcohol Use: No   OB History    No data available     Review of Systems  Constitutional: Negative for fever and chills.  HENT: Negative.   Eyes: Negative.   Respiratory: Negative for shortness of breath.   Cardiovascular: Negative for chest pain.  Gastrointestinal: Positive for abdominal pain and abdominal distention. Negative for nausea, vomiting and diarrhea.  Endocrine: Negative.   Genitourinary: Negative for dysuria.  Musculoskeletal: Positive for arthralgias.  Skin: Negative.   Allergic/Immunologic: Negative.   Neurological: Negative.   Hematological: Negative.   Psychiatric/Behavioral: Negative.       Allergies  Shrimp; Latex; and Penicillins  Home Medications   Prior to Admission medications   Medication Sig Start Date End Date Taking? Authorizing Provider  amLODipine (NORVASC) 10 MG tablet Take 1 tablet (10 mg total) by mouth daily. 10/29/14  Yes Boykin Nearing, MD  aspirin EC 81 MG tablet Take 81 mg  by mouth daily.   Yes Historical Provider, MD  Aspirin-Acetaminophen-Caffeine (EXCEDRIN PO) Take 1 tablet by mouth 2 (two) times daily as needed (pain).   Yes Historical Provider, MD  labetalol (NORMODYNE) 200 MG tablet Take 1 tablet (200 mg total) by mouth 2 (two) times daily. 04/27/14  Yes Boykin Nearing, MD  Multiple Vitamin (MULITIVITAMIN WITH MINERALS) TABS Take 1 tablet by mouth daily.   Yes Historical Provider, MD  aluminum-magnesium hydroxide 200-200 MG/5ML suspension Take 10 mLs by mouth every 6 (six) hours as needed for indigestion. 02/21/15   Vivi Barrack, MD   clindamycin (CLEOCIN) 150 MG capsule Take 3 capsules (450 mg total) by mouth 3 (three) times daily. May dispense as 150mg  capsules Patient not taking: Reported on 02/21/2015 10/15/14   Delsa Grana, PA-C  omeprazole (PRILOSEC) 20 MG capsule Take 1 capsule (20 mg total) by mouth 2 (two) times daily before a meal. Take 30 minutes before meals Patient not taking: Reported on 02/21/2015 11/10/14   Boykin Nearing, MD  polyethylene glycol (MIRALAX) packet Take 17 g by mouth daily. 02/21/15   Vivi Barrack, MD  Simethicone 125 MG CAPS Take 1 capsule (125 mg total) by mouth 4 (four) times daily as needed. 02/21/15   Vivi Barrack, MD  traMADol (ULTRAM) 50 MG tablet Take 1 tablet (50 mg total) by mouth every 8 (eight) hours as needed. Patient not taking: Reported on 02/21/2015 11/10/14   Josalyn Funches, MD   BP 144/59 mmHg  Pulse 61  Temp(Src) 98.1 F (36.7 C) (Oral)  Resp 18  SpO2 100% Physical Exam  Constitutional: She is oriented to person, place, and time. She appears well-developed and well-nourished. No distress.  HENT:  Head: Normocephalic and atraumatic.  Mouth/Throat: Oropharynx is clear and moist. Abnormal dentition. Dental caries present. No dental abscesses.  Three teeth on left lower mandible without erythema, edema, fluctuance, or drainage.   Eyes: EOM are normal. Pupils are equal, round, and reactive to light.  Neck: Neck supple.  Cardiovascular: Normal rate, regular rhythm and normal heart sounds.   Pulmonary/Chest: Effort normal and breath sounds normal. No respiratory distress.  Abdominal: Soft. Bowel sounds are normal. She exhibits no distension. There is no tenderness. There is no rebound and no guarding.  Musculoskeletal: Normal range of motion. She exhibits no edema.  Neurological: She is alert and oriented to person, place, and time. No cranial nerve deficit.  Skin: Skin is warm and dry. No erythema.  Psychiatric: She has a normal mood and affect. Her behavior is normal.   Nursing note and vitals reviewed.   ED Course  Procedures (including critical care time) Labs Review Labs Reviewed  URINALYSIS, ROUTINE W REFLEX MICROSCOPIC (NOT AT [facility]) - Abnormal; Notable for the following:    APPearance CLOUDY (*)    Leukocytes, UA SMALL (*)    All other components within normal limits  COMPREHENSIVE METABOLIC PANEL - Abnormal; Notable for the following:    BUN <5 (*)    Albumin 3.4 (*)    All other components within normal limits  CBC WITH DIFFERENTIAL/PLATELET - Abnormal; Notable for the following:    RBC 3.81 (*)    Hemoglobin 11.9 (*)    All other components within normal limits  URINE MICROSCOPIC-ADD ON - Abnormal; Notable for the following:    Squamous Epithelial / LPF 6-30 (*)    Bacteria, UA RARE (*)    All other components within normal limits    Imaging Review No results found. I have  personally reviewed and evaluated these images and lab results as part of my medical decision-making.   EKG Interpretation None      MDM   Final diagnoses:  Abdominal pain, unspecified abdominal location  Constipation, unspecified constipation type   Patient is a 64 year old female with history of HTN presenting with 2-3 weeks of abdominal pain and several months of dental pain. Abdominal exam benign with no focal tenderness or peritoneal signs. Patient with poor dentition, but no signs of infection or abscess. CBC, CMP, and UA here negative. Abdominal pain likely due to constipation and gas pains. No evidence for obstruction or other acute intra-abdominal etiologies. Will discharge patient home with simethicone and miralax. Instructed patient to follow up with PCP for continued management of her abdominal pain. Patient may need a referral to GI if continues to have abdominal pain. Return precautions reviewed.   Vivi Barrack, MD 02/21/15 [id number]  Carmin Muskrat, MD 02/27/15 [phone]

## 2015-02-21 NOTE — ED Notes (Signed)
Pt comfortable with discharge and follow up instructions. Pt declines wheelchair, escorted to waiting area by this RN. Prescriptions x3. 

## 2015-02-21 NOTE — Discharge Instructions (Signed)

## 2015-02-21 NOTE — ED Notes (Addendum)
Pt presents from home with c/o left lower dental pain x32mos and lower abdominal pain with associated nausea x2weeks.

## 2015-02-24 ENCOUNTER — Encounter: Payer: Self-pay | Admitting: Podiatry

## 2015-02-24 ENCOUNTER — Ambulatory Visit (INDEPENDENT_AMBULATORY_CARE_PROVIDER_SITE_OTHER): Payer: Commercial Managed Care - HMO | Admitting: Podiatry

## 2015-02-24 VITALS — BP 142/86 | HR 69 | Resp 12

## 2015-02-24 DIAGNOSIS — M7741 Metatarsalgia, right foot: Secondary | ICD-10-CM

## 2015-02-24 DIAGNOSIS — M7742 Metatarsalgia, left foot: Secondary | ICD-10-CM | POA: Diagnosis not present

## 2015-02-24 DIAGNOSIS — B351 Tinea unguium: Secondary | ICD-10-CM | POA: Diagnosis not present

## 2015-02-24 NOTE — Progress Notes (Signed)
   Subjective:    Patient ID: [name], female    DOB: 1950/04/16, 64 y.o.   MRN: [id number]  HPI   This patient presents today complaining of bilateral plantar foot pain in the MPJ area with standing and walking relieved with rest and elevation. The symptoms have been present for approximately 2 months and are gradually increasing over time. She says that she wears athletic style shoes which have slightly reduced some of discomfort in these areas. She also says her toenails are long and thick and is requesting debridement of the toenails. She denies any recent podiatric care or professional evaluation for these problems   Review of Systems  Eyes: Positive for redness.  Gastrointestinal: Positive for abdominal pain and abdominal distention.       Objective:   Physical Exam  Orientated 3  Vascular: No peripheral edema bilaterally DP and PT pulses 2/4 bilaterally Capillary reflex immediate bilaterally  Neurological: Sensation to 10 g monofilament wire intact 5/5 bilaterally Vibratory sensation reactive bilaterally Ankle reflex equal and reactive bilaterally  Dermatological: Texture and turgor within normal limits bilaterally The toenails are elongated, brittle, hypertrophic, deformed and tender to direct palpation 6-10 No skin lesions bilaterally  Musculoskeletal: No deformities noted bilaterally Palpable tenderness plantar second and third MPJ bilaterally without any palpable lesions Fat pads MPJ, heels bilaterally are adequate There is no pain or crepitus on range of motion MPJs 1 through 5 bilaterally       Assessment & Plan:   Assessment: Satisfactory neurovascular status Metatarsalgia bilaterally Mycotic toenails 6-10  Plan: Today I review the results of the examination with patient today. I made aware that she had a low-grade irritation in the ball area of the right and left feet (metatarsalgia bilaterally) I recommended she wear relatively new athletic  or running style shoes. He offers self pay silicone pads and she declined purchase. I also suggested she could buy an over-the-counter arch support with a soft metatarsal raise to wear inside her athletic style shoes  The toenails 6-10 were debrided mechanically and electrically without any bleeding  Reappoint at patient's request

## 2015-02-24 NOTE — Patient Instructions (Signed)
Purchase an over-the-counter soft arch support with a metatarsal raise for the right and left feet Wear new shoes such as new balance running or walking shoes with the soft arch supports as described above

## 2015-03-09 ENCOUNTER — Ambulatory Visit: Payer: Commercial Managed Care - HMO | Attending: Family Medicine | Admitting: Family Medicine

## 2015-03-09 ENCOUNTER — Encounter: Payer: Self-pay | Admitting: Family Medicine

## 2015-03-09 VITALS — BP 148/62 | HR 63 | Temp 97.4°F | Resp 16 | Ht 65.0 in | Wt 157.0 lb

## 2015-03-09 DIAGNOSIS — Z87891 Personal history of nicotine dependence: Secondary | ICD-10-CM | POA: Diagnosis not present

## 2015-03-09 DIAGNOSIS — L819 Disorder of pigmentation, unspecified: Secondary | ICD-10-CM

## 2015-03-09 DIAGNOSIS — Z7982 Long term (current) use of aspirin: Secondary | ICD-10-CM | POA: Diagnosis not present

## 2015-03-09 DIAGNOSIS — Z0000 Encounter for general adult medical examination without abnormal findings: Secondary | ICD-10-CM | POA: Diagnosis present

## 2015-03-09 DIAGNOSIS — K648 Other hemorrhoids: Secondary | ICD-10-CM | POA: Diagnosis not present

## 2015-03-09 DIAGNOSIS — R109 Unspecified abdominal pain: Secondary | ICD-10-CM | POA: Insufficient documentation

## 2015-03-09 DIAGNOSIS — L816 Other disorders of diminished melanin formation: Secondary | ICD-10-CM

## 2015-03-09 DIAGNOSIS — J309 Allergic rhinitis, unspecified: Secondary | ICD-10-CM | POA: Insufficient documentation

## 2015-03-09 DIAGNOSIS — J302 Other seasonal allergic rhinitis: Secondary | ICD-10-CM

## 2015-03-09 DIAGNOSIS — R1030 Lower abdominal pain, unspecified: Secondary | ICD-10-CM

## 2015-03-09 MED ORDER — FLUTICASONE PROPIONATE 50 MCG/ACT NA SUSP: 2.0000 | Freq: Every day | NASAL | Status: DC

## 2015-03-09 NOTE — Assessment & Plan Note (Signed)
Skin hypopigmentation on legs  Derm referral placed for possible light therapy

## 2015-03-09 NOTE — Progress Notes (Signed)
Patient ID: Kadasha Mall, female   DOB: 1950/07/01, 64 y.o.   MRN: [id number]   Subjective:  Patient ID: [name], female    DOB: July 17, 1950  Age: 64 y.o. MRN: [id number]  CC: Abdominal Pain   HPI Fredrica Scheidel presents for    1. ED f/u abdominal pain: she was seen in ED on 02/21/15 for lower abdominal pain. Pain has improved. Still comes and goes. Stools are soft and pass easily. Stools was black for a few days last week. She has known internal hemorrhoids seen on colonoscopy done in 08/2014. Pain is described as cramping. There is at times associated nausea. No emesis or fever. She does notice increase gas and cramping when she eats dairy products.   2. Skin discoloration: on b/l shins. First on R leg then on L. Painless. No new skin products.   Social History  Substance Use Topics  . Smoking status: Former Smoker    Quit date: 07/29/2010  . Smokeless tobacco: Never Used  . Alcohol Use: No    Outpatient Prescriptions Prior to Visit  Medication Sig Dispense Refill  . amLODipine (NORVASC) 10 MG tablet Take 1 tablet (10 mg total) by mouth daily. 30 tablet 11  . aspirin EC 81 MG tablet Take 81 mg by mouth daily.    . Aspirin-Acetaminophen-Caffeine (EXCEDRIN PO) Take 1 tablet by mouth 2 (two) times daily as needed (pain).    Marland Kitchen labetalol (NORMODYNE) 200 MG tablet Take 1 tablet (200 mg total) by mouth 2 (two) times daily. 180 tablet 1  . Multiple Vitamin (MULITIVITAMIN WITH MINERALS) TABS Take 1 tablet by mouth daily.    Marland Kitchen omeprazole (PRILOSEC) 20 MG capsule Take 1 capsule (20 mg total) by mouth 2 (two) times daily before a meal. Take 30 minutes before meals 30 capsule 0  . polyethylene glycol (MIRALAX) packet Take 17 g by mouth daily. 14 each 0  . Simethicone 125 MG CAPS Take 1 capsule (125 mg total) by mouth 4 (four) times daily as needed. 28 each 0  . aluminum-magnesium hydroxide 200-200 MG/5ML suspension Take 10 mLs by mouth every 6 (six) hours as needed for indigestion. (Patient  not taking: Reported on 03/09/2015) 355 mL 0  . clindamycin (CLEOCIN) 150 MG capsule Take 3 capsules (450 mg total) by mouth 3 (three) times daily. May dispense as 150mg  capsules (Patient not taking: Reported on 03/09/2015) 90 capsule 0  . traMADol (ULTRAM) 50 MG tablet Take 1 tablet (50 mg total) by mouth every 8 (eight) hours as needed. (Patient not taking: Reported on 03/09/2015) 30 tablet 0   No facility-administered medications prior to visit.    ROS Review of Systems  Constitutional: Negative for fever and chills.  Eyes: Negative for visual disturbance.  Respiratory: Negative for shortness of breath.   Cardiovascular: Negative for chest pain.  Gastrointestinal: Positive for nausea and abdominal pain. Negative for vomiting, diarrhea, constipation, blood in stool, anal bleeding and rectal pain.  Musculoskeletal: Negative for back pain and arthralgias.  Skin: Positive for color change. Negative for rash.  Allergic/Immunologic: Negative for immunocompromised state.  Hematological: Negative for adenopathy. Does not bruise/bleed easily.  Psychiatric/Behavioral: Negative for suicidal ideas and dysphoric mood.    Objective:  BP 148/62 mmHg  Pulse 63  Temp(Src) 97.4 F (36.3 C) (Oral)  Resp 16  Ht 5\' 5"  (1.651 m)  Wt 157 lb (71.215 kg)  BMI 26.13 kg/m2  SpO2 100%  BP/Weight 03/09/2015 02/24/2015 [id number]  Systolic BP 123456 [id number]  Diastolic BP 62  86 68  Wt. (Lbs) 157 - -  BMI 26.13 - -   Physical Exam  Constitutional: She is oriented to person, place, and time. She appears well-developed and well-nourished. No distress.  HENT:  Head: Normocephalic and atraumatic.  Nose: Mucosal edema present.  Cardiovascular: Normal rate, regular rhythm, normal heart sounds and intact distal pulses.   Pulmonary/Chest: Effort normal and breath sounds normal.  Abdominal: Soft. Bowel sounds are normal. She exhibits no distension and no mass. There is no tenderness. There is no rebound and no  guarding.  Genitourinary:     Musculoskeletal: She exhibits no edema.  Neurological: She is alert and oriented to person, place, and time.  Skin: Skin is warm and dry. No rash noted.  Psychiatric: She has a normal mood and affect.    Assessment & Plan:   Problem List Items Addressed This Visit    Allergic rhinitis   Seasonal allergies   Relevant Medications   fluticasone (FLONASE) 50 MCG/ACT nasal spray    Other Visit Diagnoses    Healthcare maintenance    -  Primary    Relevant Orders    Flu Vaccine QUAD 36+ mos IM (Completed)    Skin hypopigmentation        Relevant Orders    Ambulatory referral to Dermatology       No orders of the defined types were placed in this encounter.    Follow-up: No Follow-up on file.   Boykin Nearing MD

## 2015-03-09 NOTE — Assessment & Plan Note (Signed)
I suspect lactose intolerance Asked patient to keep a diary.  Avoid lactose as much as possible

## 2015-03-09 NOTE — Progress Notes (Signed)
F/U abdominal pain  No NVD No pain today  No tobacco user  No suicidal thought in the past two weeks

## 2015-03-09 NOTE — Patient Instructions (Addendum)
Lakara was seen today for abdominal pain.  Diagnoses and all orders for this visit:  Healthcare maintenance -     Flu Vaccine QUAD 36+ mos IM  Other seasonal allergic rhinitis  Seasonal allergies -     fluticasone (FLONASE) 50 MCG/ACT nasal spray; Place 2 sprays into both nostrils daily.  Skin hypopigmentation -     Ambulatory referral to Dermatology   F/u in 6 weeks for pap smear  Dr. Adrian Blackwater

## 2015-03-10 ENCOUNTER — Other Ambulatory Visit: Payer: Self-pay | Admitting: Family Medicine

## 2015-03-11 ENCOUNTER — Other Ambulatory Visit: Payer: Self-pay | Admitting: *Deleted

## 2015-03-11 DIAGNOSIS — I10 Essential (primary) hypertension: Secondary | ICD-10-CM

## 2015-03-11 MED ORDER — LABETALOL HCL 200 MG PO TABS: 200.0000 mg | ORAL_TABLET | Freq: Two times a day (BID) | ORAL | Status: DC

## 2015-03-28 DIAGNOSIS — N280 Ischemia and infarction of kidney: Secondary | ICD-10-CM

## 2015-03-28 HISTORY — DX: Ischemia and infarction of kidney: N28.0

## 2015-04-11 ENCOUNTER — Emergency Department (HOSPITAL_COMMUNITY)
Admission: EM | Admit: 2015-04-11 | Discharge: 2015-04-11 | Disposition: A | Discharge status: Home / Self Care | Payer: PPO | Attending: Emergency Medicine | Admitting: Emergency Medicine

## 2015-04-11 DIAGNOSIS — Z87828 Personal history of other (healed) physical injury and trauma: Secondary | ICD-10-CM | POA: Diagnosis not present

## 2015-04-11 DIAGNOSIS — I10 Essential (primary) hypertension: Secondary | ICD-10-CM | POA: Insufficient documentation

## 2015-04-11 DIAGNOSIS — Z91040 Latex allergy status: Secondary | ICD-10-CM | POA: Diagnosis not present

## 2015-04-11 DIAGNOSIS — M1990 Unspecified osteoarthritis, unspecified site: Secondary | ICD-10-CM | POA: Insufficient documentation

## 2015-04-11 DIAGNOSIS — R011 Cardiac murmur, unspecified: Secondary | ICD-10-CM | POA: Insufficient documentation

## 2015-04-11 DIAGNOSIS — Z8639 Personal history of other endocrine, nutritional and metabolic disease: Secondary | ICD-10-CM | POA: Diagnosis not present

## 2015-04-11 DIAGNOSIS — Z880 Allergy status to penicillin: Secondary | ICD-10-CM | POA: Insufficient documentation

## 2015-04-11 DIAGNOSIS — Z7982 Long term (current) use of aspirin: Secondary | ICD-10-CM | POA: Insufficient documentation

## 2015-04-11 DIAGNOSIS — Z7951 Long term (current) use of inhaled steroids: Secondary | ICD-10-CM | POA: Diagnosis not present

## 2015-04-11 DIAGNOSIS — Z87891 Personal history of nicotine dependence: Secondary | ICD-10-CM | POA: Insufficient documentation

## 2015-04-11 DIAGNOSIS — J069 Acute upper respiratory infection, unspecified: Secondary | ICD-10-CM | POA: Insufficient documentation

## 2015-04-11 DIAGNOSIS — K0889 Other specified disorders of teeth and supporting structures: Secondary | ICD-10-CM | POA: Insufficient documentation

## 2015-04-11 DIAGNOSIS — Z79899 Other long term (current) drug therapy: Secondary | ICD-10-CM | POA: Insufficient documentation

## 2015-04-11 DIAGNOSIS — K002 Abnormalities of size and form of teeth: Secondary | ICD-10-CM | POA: Diagnosis not present

## 2015-04-11 DIAGNOSIS — R05 Cough: Secondary | ICD-10-CM | POA: Diagnosis not present

## 2015-04-11 MED ORDER — GUAIFENESIN 100 MG/5ML PO LIQD: 100.0000 mg | ORAL | Status: DC | PRN

## 2015-04-11 MED ORDER — IBUPROFEN 400 MG PO TABS: 400.0000 mg | ORAL_TABLET | Freq: Four times a day (QID) | ORAL | Status: DC | PRN

## 2015-04-11 NOTE — ED Notes (Signed)
Pt in from home c/o generalized body aches, non productive cough, pt c/o L ear pain, pt denies n/v/d, pt c/o L lower dental pain onset with broken tooth present, poor dental hygeine noted, A&O x4

## 2015-04-11 NOTE — ED Provider Notes (Signed)
CSN: [id number]     Arrival date & time 04/11/15  [id number] History  By signing my name below, I, [name], attest that this documentation has been prepared under the direction and in the presence of [employer], PA-C. Electronically Signed: Irene Huang, ED Scribe. 04/11/2015. 9:30 AM.   Chief Complaint  Patient presents with  . Cough  . Dental Pain   The history is provided by the patient. No language interpreter was used.   HPI Comments: [name] is a 65 y.o. female with a hx of HTN who presents to the Emergency Department complaining of a gradually worsening, non-productive cough onset 3 days ago. Pt reports associated generalized myalgias and left ear pain. Pt was [facility] at [facility] for these symptoms and given Flonase, which she has been using to relief. Pt also presents with left lower dental pain following a broken tooth. She denies fevers, chills, SOB, chest pain, leg swelling, nausea, vomiting, or diarrhea. Pt received her flu shot in December and states that her arm still hurts at the injection site. She takes amlodipine for her HTN daily. Pt does not have a dentist.    Past Medical History  Diagnosis Date  . Meniscus tear     L KNEE  . Arthritis 2000  . Hypertension 2010  . Abnormal EKG 2012    hospitalized for T wave inversion in lateral leads with MSK chest pains, normal ECHO, negative trops, no cardiology consult. recent EKG 7/15 stable T wave inversions.   . Heart murmur   . Hyperlipidemia    Past Surgical History  Procedure Laterality Date  . Knee surgery Right 1999  . Tubal ligation  1979  . Abdominal surgery  2010    for bowel blockage  . Knee arthroscopy Left 09/25/2013    Procedure: LEFT KNEE ARTHROSCOPY WITH PARTIAL MEDIAL AND LATERAL  MENISCECTOMY/DEBRIDEMENT/MICRO FRACTURE MEDIAL FEMORAL CONDYLE, REMOVAL OF OSTEOCONDRAL FRAGMENT;  Surgeon: Johnn Hai, MD;  Location: [facility];  Service: Orthopedics;  Laterality: Left;   Family  History  Problem Relation Age of Onset  . Hypertension Mother   . Heart disease Mother     has a pacemaker   . Heart failure Father   . Hypertension Father   . Cancer Neg Hx   . Colon cancer Neg Hx    Social History  Substance Use Topics  . Smoking status: Former Smoker    Quit date: 07/29/2010  . Smokeless tobacco: Never Used  . Alcohol Use: No   OB History    No data available     Review of Systems 10 Systems reviewed and all are negative for acute change except as noted in the HPI.  Allergies  Shrimp; Latex; and Penicillins  Home Medications   Prior to Admission medications   Medication Sig Start Date End Date Taking? Authorizing Provider  amLODipine (NORVASC) 10 MG tablet Take 1 tablet (10 mg total) by mouth daily. 10/29/14   Boykin Nearing, MD  aspirin EC 81 MG tablet Take 81 mg by mouth daily.    Historical Provider, MD  Aspirin-Acetaminophen-Caffeine (EXCEDRIN PO) Take 1 tablet by mouth 2 (two) times daily as needed (pain).    Historical Provider, MD  fluticasone (FLONASE) 50 MCG/ACT nasal spray Place 2 sprays into both nostrils daily. 03/09/15   Josalyn Funches, MD  guaiFENesin (ROBITUSSIN) 100 MG/5ML liquid Take 5-10 mLs (100-200 mg total) by mouth every 4 (four) hours as needed for cough. 04/11/15   Comer Locket, PA-C  ibuprofen (ADVIL,MOTRIN) 400 MG tablet Take 1 tablet (400 mg total) by mouth every 6 (six) hours as needed. 04/11/15   Comer Locket, PA-C  labetalol (NORMODYNE) 200 MG tablet Take 1 tablet (200 mg total) by mouth 2 (two) times daily. 03/11/15   Boykin Nearing, MD  Multiple Vitamin (MULITIVITAMIN WITH MINERALS) TABS Take 1 tablet by mouth daily.    Historical Provider, MD  omeprazole (PRILOSEC) 20 MG capsule Take 1 capsule (20 mg total) by mouth 2 (two) times daily before a meal. Take 30 minutes before meals 11/10/14   Josalyn Funches, MD  polyethylene glycol (MIRALAX) packet Take 17 g by mouth daily. 02/21/15   Vivi Barrack, MD  Simethicone 125  MG CAPS Take 1 capsule (125 mg total) by mouth 4 (four) times daily as needed. 02/21/15   Vivi Barrack, MD   BP 141/64 mmHg  Pulse 61  Temp(Src) 97.9 F (36.6 C) (Oral)  Resp 16  Ht 5\' 5"  (1.651 m)  Wt 72.576 kg  BMI 26.63 kg/m2  SpO2 98% Physical Exam  Constitutional: She is oriented to person, place, and time. She appears well-developed and well-nourished. No distress.  HENT:  Head: Normocephalic and atraumatic.  Right Ear: External ear normal.  Left Ear: External ear normal.  Mouth/Throat: Oropharynx is clear and moist. No oropharyngeal exudate.  Discomfort located to the left mandibular canine's. Overall poor dentition. Multiple missing teeth with active caries. Mucous membranes are moist. No unilateral tonsillar swelling, uvula midline, no glossal swelling or elevation. No trismus. No fluctuance or evidence of a drainable abscess. No other evidence of emergent infection, Retropharyngeal or Peritonsillar abscess, Ludwig or Vincents angina. Tolerating secretions well. Patent airway Left TM with cerumen impaction. Right TM normal. Right TM normal. Left TM with cerumen impaction. No other evidence of infection   Eyes: Conjunctivae and EOM are normal.  Neck: Normal range of motion. Neck supple. No JVD present. No tracheal deviation present.  Cardiovascular: Normal rate and regular rhythm.   Pulmonary/Chest: Effort normal and breath sounds normal. No respiratory distress. She has no wheezes. She has no rales.  Musculoskeletal: Normal range of motion.  Lymphadenopathy:    She has no cervical adenopathy.  Neurological: She is alert and oriented to person, place, and time.  Skin: Skin is warm and dry.  Psychiatric: She has a normal mood and affect. Her behavior is normal.  Nursing note and vitals reviewed.   ED Course  Procedures (including critical care time) DIAGNOSTIC STUDIES: Oxygen Saturation is 98% on RA, normal by my interpretation.    COORDINATION OF CARE: 9:31  AM-Discussed treatment plan which includes antitussive and motrin with pt at bedside and pt agreed to plan.    Labs Review Labs Reviewed - No data to display  Imaging Review No results found. I have personally reviewed and evaluated these images and lab results as part of my medical decision-making.   EKG Interpretation None      Filed Vitals:   04/11/15 0858  BP: 141/64  Pulse: 61  Temp: 97.9 F (36.6 C)  Resp: 16    Meds given in ED:  Medications - No data to display  New Prescriptions   GUAIFENESIN (ROBITUSSIN) 100 MG/5ML LIQUID    Take 5-10 mLs (100-200 mg total) by mouth every 4 (four) hours as needed for cough.   IBUPROFEN (ADVIL,MOTRIN) 400 MG TABLET    Take 1 tablet (400 mg total) by mouth every 6 (six) hours as needed.    MDM  Patients  symptoms are consistent with URI, likely viral etiology. Benign cardiopulmonary exam with stable vital signs and afebrile. No further imaging is indicated. Discussed that antibiotics are not indicated for viral infections. Pt will be discharged with symptomatic treatment.  Patient with toothache.  Chronically poor dentition. No gross abscess.  Exam unconcerning for Ludwig's angina or spread of infection.  Urged patient to follow-up with dentist.  Referral given. Will also be able to follow-up with PCP. Verbalizes understanding and is agreeable with plan. Pt is hemodynamically stable & in NAD prior to dc.   Final diagnoses:  URI (upper respiratory infection)    I personally performed the services described in this documentation, which was scribed in my presence. The recorded information has been reviewed and is accurate.    Comer Locket, PA-C 04/11/15 Sandy Level, MD 04/13/15 1515

## 2015-04-11 NOTE — Discharge Instructions (Signed)
Your symptoms are likely due to a virus and will resolve on their own in the next week or so. Please take your medications as prescribed. Follow-up with your doctor as needed. It is also important for you to follow-up with dentistry for definitive dental care. Please use the attached references to help find a dentist. Return to ED for any new or worsening symptoms.  Upper Respiratory Infection, Adult Most upper respiratory infections (URIs) are caused by a virus. A URI affects the nose, throat, and upper air passages. The most common type of URI is often called "the common cold." HOME CARE   Take medicines only as told by your doctor.  Gargle warm saltwater or take cough drops to comfort your throat as told by your doctor.  Use a warm mist humidifier or inhale steam from a shower to increase air moisture. This may make it easier to breathe.  Drink enough fluid to keep your pee (urine) clear or pale yellow.  Eat soups and other clear broths.  Have a healthy diet.  Rest as needed.  Go back to work when your fever is gone or your doctor says it is okay.  You may need to stay home longer to avoid giving your URI to others.  You can also wear a face mask and wash your hands often to prevent spread of the virus.  Use your inhaler more if you have asthma.  Do not use any tobacco products, including cigarettes, chewing tobacco, or electronic cigarettes. If you need help quitting, ask your doctor. GET HELP IF:  You are getting worse, not better.  Your symptoms are not helped by medicine.  You have chills.  You are getting more short of breath.  You have brown or red mucus.  You have yellow or brown discharge from your nose.  You have pain in your face, especially when you bend forward.  You have a fever.  You have puffy (swollen) neck glands.  You have pain while swallowing.  You have white areas in the back of your throat. GET HELP RIGHT AWAY IF:   You have very bad or  constant:  Headache.  Ear pain.  Pain in your forehead, behind your eyes, and over your cheekbones (sinus pain).  Chest pain.  You have long-lasting (chronic) lung disease and any of the following:  Wheezing.  Long-lasting cough.  Coughing up blood.  A change in your usual mucus.  You have a stiff neck.  You have changes in your:  Vision.  Hearing.  Thinking.  Mood. MAKE SURE YOU:   Understand these instructions.  Will watch your condition.  Will get help right away if you are not doing well or get worse.   This information is not intended to replace advice given to you by your health care provider. Make sure you discuss any questions you have with your health care provider.   Document Released: 08/30/2007 Document Revised: 07/28/2014 Document Reviewed: 06/18/2013 Elsevier Interactive Patient Education 2016 [employer]  Community Service Learning [facility]  [address]  [place], [place] [address]  Phone [phone]  The [place] in [place], [place], exemplifies the Health Net vision to improve the health and quality of life of all [place] by Regulatory affairs officer with a passion to care for the underserved and by leading the nation in community-based, service learning oral health education. We are  committed to offering comprehensive general dental services for adults, children and special needs patients in a safe, caring and professional setting.  Appointments: Our clinic is open Monday through Friday 8:00 a.m. until 5:00 p.m. The amount of time scheduled for an appointment depends on the patients specific needs. We ask that you keep your appointed time for care or provide 24-hour notice of all appointment changes. Parents or legal guardians must accompany minor children.  Payment for Services:  Medicaid and other insurance plans are welcome. Payment for services is due when services are rendered and may be made by cash or credit card. If you have dental insurance, we will assist you with your claim submission.   Emergencies: Emergency services will be provided Monday through Friday on a walk-in basis. Please arrive early for emergency services. After hours emergency services will be provided for patients of record as required.  Services:  Comprehensive General Dentistry  Childrens Dentistry  Oral Surgery - Extractions  Root Canals  Sealants and Tooth Colored Fillings  Crowns and Bridges  Dentures and Partial Dentures  Implant Services  Periodontal Services and Location manager  3-D/[facility]   Emergency Department Resource Guide 1) Find a Doctor and Pay Out of Pocket Although you won't have to find out who is covered by your insurance plan, it is a good idea to ask around and get recommendations. You will then need to call the office and see if the doctor you have chosen will accept you as a new patient and what types of options they offer for patients who are self-pay. Some doctors offer discounts or will set up payment plans for their patients who do not have insurance, but you will need to ask so you aren't surprised when you get to your appointment.  2) Contact Your Local Health Department Not all health departments have doctors that can see patients for sick visits, but many do, so it is worth a call to see if yours does. If you don't know where your local health department is, you can check in your phone book. The CDC also has a tool to help you locate your state's health department, and many state websites also have listings of all of their local health departments.  3) Find a [facility] If your illness is not likely to be very severe or complicated, you may want to try a walk in clinic. These are popping up all over the  country in pharmacies, drugstores, and shopping centers. They're usually staffed by nurse practitioners or physician assistants that have been trained to treat common illnesses and complaints. They're usually fairly quick and inexpensive. However, if you have serious medical issues or chronic medical problems, these are probably not your best option.  No Primary Care Doctor: - Call Health Connect at  [phone] - they can help you locate a primary care doctor that  accepts your insurance, provides certain services, etc. - Physician Referral Service- [phone]  Chronic Pain Problems: Organization         Address  Phone   Notes  [facility]  [phone] Patients need to be referred by their primary care doctor.   Medication Assistance: Organization         Address  Phone   Notes  [facility] Medication [facility] [address] [place], [place] [address] [phone] --Must be a resident of [facility] -- Must have NO insurance coverage whatsoever (  no Medicaid/ Medicare, etc.) -- The pt. MUST have a primary care doctor that directs their care regularly and follows them in the community   MedAssist  [phone]   [employer]  [phone]    Agencies that provide inexpensive medical care: Organization         Address  Phone   Notes  [place]  [phone]   [facility]    [phone]   [facility] [place], [place] [address] [phone]   [place] [address] [place] [phone]   Planned Parenthood    [phone]   [facility]    [phone]   [place] and [address], [place] Phone:  [phone], Fax:  [phone] Hours of Operation:  9 am - 6 pm, M-F.  Also accepts Medicaid/Medicare and self-pay.  [facility] for [address], [address], [place] Phone: [phone], Fax: [phone]. Hours of Operation:  8:30 am - 5:30 pm, M-F.  Also accepts Medicaid and self-pay.  [facility] [place] [address], [place] Phone: [phone]   [place], [place], [place] [phone], Ext. [phone] Mondays & Thursdays: 7-9 AM.  First 15 patients are seen on a first come, first serve basis.    [place] Providers:  Organization         Address  Phone   Notes  [facility] [address], [place] [phone] Also accepts self-pay patients.  [facility] [id number] [place], [place]  [phone]   [place], [address], [place] [phone]   [facility] [address] [place] [phone]   Lucianne Lei [address], [place]   [phone] Only accepts [place] Access [place] patients after they have their name applied to their card.   Self-Pay (no insurance) in [facility]:  Organization         Address  Phone   Notes  Sickle Cell Patients, [facility] [place] [phone]   [facility] [place] [phone]   [facility] [place]  [place], [place], [place] [phone]   [facility]/Dr. Osei-Bonsu  [address], [place] or [address], [place] [phone] Phone number for both Center and [place] locations is the same.  [facility] [address], [place] [phone]   [facility] [address] [place] or [address] [phone]   [facility] [address] [place] [phone], phone; [phone], fax Sees patients 1st and 3rd Saturday of every month.  Must  not qualify for public or private insurance (i.e. Medicaid, Medicare, [place] Health Choice, Veterans' Benefits)  Household income should be no more than 200% of the poverty level The clinic cannot treat you if you are pregnant or think you are pregnant  Sexually transmitted diseases are not treated at the clinic.    Dental Care: Organization         Address  Phone  Notes  [facility] Department of Public  [facility] [address] [place], [place] [phone] Accepts children up to age 74 who are enrolled in [place] or [place]; pregnant women with a Medicaid card; and children who have applied for Medicaid or [place] Health Choice, but were declined, whose parents can pay a reduced fee at time of service.  [facility] Department of [facility]  [address], [place] [phone] Accepts children up to age 38 who are enrolled in [place] or [place]; pregnant women with a Medicaid card; and children who have applied for Medicaid or [place] Health Choice, but were declined, whose parents can pay a reduced fee at time of service.  [place] Adult Dental Access PROGRAM  [place] [phone] Patients are seen by appointment only. Walk-ins are not accepted. [place] will see patients 38 years of age and older. Monday - Tuesday (8am-5pm) Most Wednesdays (8:30-5pm) $30 per visit, cash only  [facility] Adult Dental Access PROGRAM  [address], [facility] [phone] Patients are seen by appointment only. Walk-ins are not accepted. [place] will see patients 62 years of age and older. One Wednesday Evening (Monthly: Volunteer Based).  $30 per visit, cash only  [place]  [phone] for adults; Children under age 36, call Graduate Pediatric Dentistry at [phone]. Children aged 88-14, please call [phone] to request a pediatric application.  Dental services are  provided in all areas of dental care including fillings, crowns and bridges, complete and partial dentures, implants, gum treatment, root canals, and extractions. Preventive care is also provided. Treatment is provided to both adults and children. Patients are selected via a lottery and there is often a waiting list.   [facility] [address], [place]  [phone] [email or web address]   [facility] [address] [place], [place] [phone], Ext. [phone] Second and Fourth Thursday of each month, opens at 6:30 AM; Clinic ends at 9 AM.  Patients are seen on a first-come first-served basis, and a limited number are seen during each clinic.   [facility]  [address] Hillard Danker [place], [place] [phone]   Eligibility Requirements You must have lived in [place], [place], or [place] counties for at least the last three months.   You cannot be eligible for state or federal sponsored [employer], including [employer], [place], or [employer].   You generally cannot be eligible for healthcare insurance through your employer.    How to apply: Eligibility screenings are held every Tuesday and Wednesday afternoon from 1:00 pm until 4:00 pm. You do not need an appointment for the interview!  [facility] [address], [place], [place]   [place]  [place] Department  [place]  [phone]    Behavioral Health Resources in the Community: Intensive Outpatient Programs Organization         Address  Phone  Notes  [place]. [address], [place], [place] [phone]   [facility] Outpatient [address] [place], [place]   ADS: Alcohol & Drug Svcs [address], [place], [place]   [place] [address],  [place], [place] or [phone]   Substance Abuse Resources Organization         Address  Phone  Notes  Alcohol and Drug Services  [place]  [phone]   [facility] [place]  [phone]   Chinita Pester  [phone]   Residential & Outpatient Substance Abuse Program  [phone]   Psychological Services Organization         Address  Phone  Notes  [facility] [place]  [place]  [phone]   [place] [address] [place] or [phone]    Mobile Crisis Teams Organization         Address  Phone  Notes  [facility], Mobile Crisis Care Unit  [phone]   [facility] Psychotherapeutic Services  [address] [place], [place]   [place] Levels [address] [place] [phone]    Self-Help/Support Groups Organization         Address  Phone             Notes  [place]. of [place] - variety of support groups  [place] Call for more information  Narcotics Anonymous (NA), [facility] [address], [employer] [place]  2 meetings at this location   Special educational needs teacher         Address  Phone  Notes  ASAP Residential Treatment [place],    [place]  [phone]   [facility]  [address], [place] [phone], [place], [place]   [place], [place] [phone] Admissions: 8am-3pm M-F  Incentives Substance [place] [address],    [place], [place] [phone]   [facility] [address] [place], [place], [place]   [facility] Adult Mental Health Services [address],  [place], [place]   [facility] Programs - Intensive Outpatient [address], [place], [place]   [facility] [place].,  [place], [place] [phone] or  [phone]   [facility]idential Treatment Services (RTS) [address], [place], [place] Accepts Medicaid  [facility] [place] [address],  [place] [phone] Substance Abuse/Addiction Treatment   [facility] Organization         Address  Phone  Notes  [facility]  [phone]   Domenic Schwab, PhD [address] Arlis Porta [place], [place]   [phone] or [phone]   [place], [place] [phone]   [facility] [address], [place], [place] [phone] Insurance/Medicaid/sponsorship through [facility] and Families [address]                                    [place], [place] [phone] [place] [address], [place] [phone]    Dr. Adele Schilder  [phone]   [facility] Dept. 1) [address] [place] 2) [place] 3)  [address], [place] [phone]  [phone]   [place] [phone] or [phone] (After Hours)

## 2015-05-11 ENCOUNTER — Encounter (HOSPITAL_COMMUNITY): Payer: Self-pay | Admitting: *Deleted

## 2015-05-11 ENCOUNTER — Emergency Department (HOSPITAL_COMMUNITY): Payer: PPO

## 2015-05-11 DIAGNOSIS — M25561 Pain in right knee: Secondary | ICD-10-CM | POA: Diagnosis not present

## 2015-05-11 DIAGNOSIS — M179 Osteoarthritis of knee, unspecified: Secondary | ICD-10-CM | POA: Diagnosis not present

## 2015-05-11 DIAGNOSIS — Z7982 Long term (current) use of aspirin: Secondary | ICD-10-CM | POA: Insufficient documentation

## 2015-05-11 DIAGNOSIS — Z79899 Other long term (current) drug therapy: Secondary | ICD-10-CM | POA: Diagnosis not present

## 2015-05-11 DIAGNOSIS — R011 Cardiac murmur, unspecified: Secondary | ICD-10-CM | POA: Diagnosis not present

## 2015-05-11 DIAGNOSIS — M170 Bilateral primary osteoarthritis of knee: Secondary | ICD-10-CM | POA: Diagnosis not present

## 2015-05-11 DIAGNOSIS — Z91040 Latex allergy status: Secondary | ICD-10-CM | POA: Diagnosis not present

## 2015-05-11 DIAGNOSIS — Y9389 Activity, other specified: Secondary | ICD-10-CM | POA: Diagnosis not present

## 2015-05-11 DIAGNOSIS — Z880 Allergy status to penicillin: Secondary | ICD-10-CM | POA: Diagnosis not present

## 2015-05-11 DIAGNOSIS — Z87828 Personal history of other (healed) physical injury and trauma: Secondary | ICD-10-CM | POA: Insufficient documentation

## 2015-05-11 DIAGNOSIS — Z87891 Personal history of nicotine dependence: Secondary | ICD-10-CM | POA: Insufficient documentation

## 2015-05-11 DIAGNOSIS — M25461 Effusion, right knee: Secondary | ICD-10-CM | POA: Diagnosis not present

## 2015-05-11 DIAGNOSIS — Z8639 Personal history of other endocrine, nutritional and metabolic disease: Secondary | ICD-10-CM | POA: Diagnosis not present

## 2015-05-11 DIAGNOSIS — M25462 Effusion, left knee: Secondary | ICD-10-CM | POA: Diagnosis not present

## 2015-05-11 DIAGNOSIS — Z7951 Long term (current) use of inhaled steroids: Secondary | ICD-10-CM | POA: Insufficient documentation

## 2015-05-11 DIAGNOSIS — I10 Essential (primary) hypertension: Secondary | ICD-10-CM | POA: Diagnosis not present

## 2015-05-11 DIAGNOSIS — M7051 Other bursitis of knee, right knee: Secondary | ICD-10-CM | POA: Diagnosis not present

## 2015-05-11 NOTE — ED Notes (Signed)
Pt now c/o left knee pain and would like XR done right left knee

## 2015-05-11 NOTE — ED Notes (Addendum)
Pt c/o right knee pain for three days. Denies any injury. Right knee is significantly more swollen than her left knee. Pt has strong popliteal pulse.

## 2015-05-12 ENCOUNTER — Emergency Department (HOSPITAL_COMMUNITY)
Admission: EM | Admit: 2015-05-12 | Discharge: 2015-05-12 | Disposition: A | Discharge status: Home / Self Care | Payer: PPO | Attending: Emergency Medicine | Admitting: Emergency Medicine

## 2015-05-12 DIAGNOSIS — M170 Bilateral primary osteoarthritis of knee: Secondary | ICD-10-CM

## 2015-05-12 DIAGNOSIS — M25462 Effusion, left knee: Secondary | ICD-10-CM

## 2015-05-12 DIAGNOSIS — M7051 Other bursitis of knee, right knee: Secondary | ICD-10-CM

## 2015-05-12 DIAGNOSIS — M25461 Effusion, right knee: Secondary | ICD-10-CM

## 2015-05-12 MED ORDER — PREDNISONE 10 MG (21) PO TBPK: 10.0000 mg | ORAL_TABLET | Freq: Every day | ORAL | Status: DC

## 2015-05-12 MED ORDER — HYDROCODONE-ACETAMINOPHEN 5-325 MG PO TABS
1.0000 | ORAL_TABLET | Freq: Four times a day (QID) | ORAL | Status: DC | PRN
Start: 2015-05-12 — End: 2015-06-14

## 2015-05-12 MED ORDER — PREDNISONE 20 MG PO TABS
60.0000 mg | ORAL_TABLET | Freq: Once | ORAL | Status: AC
  Administered 2015-05-12: 60 mg via ORAL
  Filled 2015-05-12: qty 3

## 2015-05-12 MED ORDER — DICYCLOMINE HCL 10 MG PO CAPS: 20.0000 mg | ORAL_CAPSULE | Freq: Once | ORAL | Status: DC

## 2015-05-12 MED ORDER — KETOROLAC TROMETHAMINE 30 MG/ML IJ SOLN: 60.0000 mg | Freq: Once | INTRAMUSCULAR | Status: DC

## 2015-05-12 NOTE — ED Provider Notes (Signed)
TIME SEEN: 5:00 AM  CHIEF COMPLAINT: bilateral knee pain  HPI: Pt is a 65 y.o. with HTN, HLD, prior bilateral knee pain with surgery previously years ago to the R knee who presents to ED with bilateral knee swelling and pain worse on the R than L.  No injury.  Able to ambulate.  No numbness, weakness.  No fever.  No h/o gout.  No h/o IVDA, DM, HIV, immunocompromised state.  No calf tenderness or swelling.  No h/o DVT.  Pain is worse with walking and standing.  ROS: See HPI Constitutional: no fever  Eyes: no drainage  ENT: no runny nose   Cardiovascular:  no chest pain  Resp: no SOB  GI: no vomiting GU: no dysuria Integumentary: no rash  Allergy: no hives  Musculoskeletal: no leg swelling  Neurological: no slurred speech ROS otherwise negative  PAST MEDICAL HISTORY/PAST SURGICAL HISTORY:  Past Medical History  Diagnosis Date  . Meniscus tear     L KNEE  . Arthritis 2000  . Hypertension 2010  . Abnormal EKG 2012    hospitalized for T wave inversion in lateral leads with MSK chest pains, normal ECHO, negative trops, no cardiology consult. recent EKG 7/15 stable T wave inversions.   . Heart murmur   . Hyperlipidemia     MEDICATIONS:  Prior to Admission medications   Medication Sig Start Date End Date Taking? Authorizing Provider  amLODipine (NORVASC) 10 MG tablet Take 1 tablet (10 mg total) by mouth daily. 10/29/14   Boykin Nearing, MD  aspirin EC 81 MG tablet Take 81 mg by mouth daily.    Historical Provider, MD  Aspirin-Acetaminophen-Caffeine (EXCEDRIN PO) Take 1 tablet by mouth 2 (two) times daily as needed (pain).    Historical Provider, MD  fluticasone (FLONASE) 50 MCG/ACT nasal spray Place 2 sprays into both nostrils daily. 03/09/15   Josalyn Funches, MD  guaiFENesin (ROBITUSSIN) 100 MG/5ML liquid Take 5-10 mLs (100-200 mg total) by mouth every 4 (four) hours as needed for cough. 04/11/15   Comer Locket, PA-C  ibuprofen (ADVIL,MOTRIN) 400 MG tablet Take 1 tablet (400 mg  total) by mouth every 6 (six) hours as needed. 04/11/15   Comer Locket, PA-C  labetalol (NORMODYNE) 200 MG tablet Take 1 tablet (200 mg total) by mouth 2 (two) times daily. 03/11/15   Boykin Nearing, MD  Multiple Vitamin (MULITIVITAMIN WITH MINERALS) TABS Take 1 tablet by mouth daily.    Historical Provider, MD  omeprazole (PRILOSEC) 20 MG capsule Take 1 capsule (20 mg total) by mouth 2 (two) times daily before a meal. Take 30 minutes before meals 11/10/14   Josalyn Funches, MD  polyethylene glycol (MIRALAX) packet Take 17 g by mouth daily. 02/21/15   Vivi Barrack, MD  Simethicone 125 MG CAPS Take 1 capsule (125 mg total) by mouth 4 (four) times daily as needed. 02/21/15   Vivi Barrack, MD    ALLERGIES:  Allergies  Allergen Reactions  . Shrimp [Shellfish Allergy] Hives and Itching  . Latex Itching  . Penicillins Itching and Rash   SOCIAL HISTORY:  Social History  Substance Use Topics  . Smoking status: Former Smoker    Quit date: 07/29/2010  . Smokeless tobacco: Never Used  . Alcohol Use: No    FAMILY HISTORY: Family History  Problem Relation Age of Onset  . Hypertension Mother   . Heart disease Mother     has a pacemaker   . Heart failure Father   . Hypertension Father   .  Cancer Neg Hx   . Colon cancer Neg Hx     EXAM: BP 136/62 mmHg  Pulse 63  Temp(Src) 97.7 F (36.5 C) (Oral)  Resp 20  Ht 5\' 5"  (1.651 m)  Wt 155 lb 4 oz (70.421 kg)  BMI 25.83 kg/m2  SpO2 98% CONSTITUTIONAL: Alert and oriented and responds appropriately to questions. Well-appearing; well-nourished HEAD: Normocephalic EYES: Conjunctivae clear, PERRL ENT: normal nose; no rhinorrhea; moist mucous membranes; pharynx without lesions noted NECK: Supple, no meningismus, no LAD  CARD: RRR; S1 and S2 appreciated; no murmurs, no clicks, no rubs, no gallops RESP: Normal chest excursion without splinting or tachypnea; breath sounds clear and equal bilaterally; no wheezes, no rhonchi, no rales, no  hypoxia or respiratory distress, speaking full sentences ABD/GI: Normal bowel sounds; non-distended; soft, non-tender, no rebound, no guarding, no peritoneal signs BACK:  The back appears normal and is non-tender to palpation, there is no CVA tenderness EXT: B/L knee effusions R > L with soft tissue swelling.  Pain with flexion.  No ligamentous laxity.  No erythema or warmth.  No induration.  2+ DP pulses bilaterally.  Normal sensation diffusely.  Normal ROM in all joints; otherwise extremities are non-tender to palpation; no edema; normal capillary refill; no cyanosis, no calf tenderness or swelling    SKIN: Normal color for age and race; warm NEURO: Moves all extremities equally, sensation to light touch intact diffusely, cranial nerves II through XII intact PSYCH: The patient's mood and manner are appropriate. Grooming and personal hygiene are appropriate.  MEDICAL DECISION MAKING: Pt with bilateral knee swelling, R worse than L.  Consistent with bursitis, osteoarthritis.  No ligamentous laxity.  Xrays show no fracture.  No sign of infection on exam.  Doubt gout, DVT.  NV intact distally.  WIll dc with pain meds and on steroid taper.  Dr. Tonita Cong is her orthopedist.  Have recommended outpt follow up.  Recommended ice, elevation.  Pt verbalized understanding and is comfortable with this plan.       [place], DO 05/12/15 [phone]

## 2015-05-12 NOTE — ED Notes (Signed)
Pt states that she has taken tramadol to help with her pain, her last dose was at 8 pm, she states that this did not help with her pain.

## 2015-05-12 NOTE — ED Notes (Signed)
Pt is getting dressed. Registration at bedside.

## 2015-05-12 NOTE — Discharge Instructions (Signed)
Bursitis Bursitis is inflammation and irritation of a bursa, which is one of the small, fluid-filled sacs that cushion and protect the moving parts of your body. These sacs are located between bones and muscles, muscle attachments, or skin areas next to bones. A bursa protects these structures from the wear and tear that results from frequent movement. An inflamed bursa causes pain and swelling. Fluid may build up inside the sac. Bursitis is most common near joints, especially the knees, elbows, hips, and shoulders. CAUSES Bursitis can be caused by:   Injury from:  A direct blow, like falling on your knee or elbow.  Overuse of a joint (repetitive stress).  Infection. This can happen if bacteria gets into a bursa through a cut or scrape near a joint.  Diseases that cause joint inflammation, such as gout and rheumatoid arthritis. RISK FACTORS You may be at risk for bursitis if you:   Have a job or hobby that involves a lot of repetitive stress on your joints.  Have a condition that weakens your body's defense system (immune system), such as diabetes, cancer, or HIV.  Lift and reach overhead often.  Kneel or lean on hard surfaces often.  Run or walk often. SIGNS AND SYMPTOMS The most common signs and symptoms of bursitis are:  Pain that gets worse when you move the affected body part or put weight on it.  Inflammation.  Stiffness. Other signs and symptoms may include:  Redness.  Tenderness.  Warmth.  Pain that continues after rest.  Fever and chills. This may occur in bursitis caused by infection. DIAGNOSIS Bursitis may be diagnosed by:   Medical history and physical exam.  MRI.  A procedure to drain fluid from the bursa with a needle (aspiration). The fluid may be checked for signs of infection or gout.  Blood tests to rule out other causes of inflammation. TREATMENT  Bursitis can usually be treated at home with rest, ice, compression, and elevation (RICE). For  mild bursitis, RICE treatment may be all you need. Other treatments may include:  Nonsteroidal anti-inflammatory drugs (NSAIDs) to treat pain and inflammation.  Corticosteroids to fight inflammation. You may have these drugs injected into and around the area of bursitis.  Aspiration of bursitis fluid to relieve pain and improve movement.  Antibiotic medicine to treat an infected bursa.  A splint, brace, or walking aid.  Physical therapy if you continue to have pain or limited movement.  Surgery to remove a damaged or infected bursa. This may be needed if you have a very bad case of bursitis or if other treatments have not worked. HOME CARE INSTRUCTIONS   Take medicines only as directed by your health care provider.  If you were prescribed an antibiotic medicine, finish it all even if you start to feel better.  Rest the affected area as directed by your health care provider.  Keep the area elevated.  Avoid activities that make pain worse.  Apply ice to the injured area:  Place ice in a plastic bag.  Place a towel between your skin and the bag.  Leave the ice on for 20 minutes, 2-3 times a day.  Use splints, braces, pads, or walking aids as directed by your health care provider.  Keep all follow-up visits as directed by your health care provider. This is important. PREVENTION   Wear knee pads if you kneel often.  Wear sturdy running or walking shoes that fit you well.  Take regular breaks from repetitive activity.  Warm  up by stretching before doing any strenuous activity.  Maintain a healthy weight or lose weight as recommended by your health care provider. Ask your health care provider if you need help.  Exercise regularly. Start any new physical activity gradually. SEEK MEDICAL CARE IF:   Your bursitis is not responding to treatment or home care.  You have a fever.  You have chills.   This information is not intended to replace advice given to you by your  health care provider. Make sure you discuss any questions you have with your health care provider.   Document Released: 03/10/2000 Document Revised: 12/02/2014 Document Reviewed: 06/02/2013 Elsevier Interactive Patient Education 2016 Else[employer]  Knee Effusion Knee effusion means that you have excess fluid in your knee joint. This can cause pain and swelling in your knee. This may make your knee more difficult to bend and move. That is because there is increased pain and pressure in the joint. If there is fluid in your knee, it often means that something is wrong inside your knee, such as severe arthritis, abnormal inflammation, or an infection. Another common cause of knee effusion is an injury to the knee muscles, ligaments, or cartilage. HOME CARE INSTRUCTIONS  Use crutches as directed by your health care provider.  Wear a knee brace as directed by your health care provider.  Apply ice to the swollen area:  Put ice in a plastic bag.  Place a towel between your skin and the bag.  Leave the ice on for 20 minutes, 2-3 times per day.  Keep your knee raised (elevated) when you are sitting or lying down.  Take medicines only as directed by your health care provider.  Do any rehabilitation or strengthening exercises as directed by your health care provider.  Rest your knee as directed by your health care provider. You may start doing your normal activities again when your health care provider approves.   Keep all follow-up visits as directed by your health care provider. This is important. SEEK MEDICAL CARE IF:  You have ongoing (persistent) pain in your knee. SEEK IMMEDIATE MEDICAL CARE IF:  You have increased swelling or redness of your knee.  You have severe pain in your knee.  You have a fever.   This information is not intended to replace advice given to you by your health care provider. Make sure you discuss any questions you have with your health care provider.     Document Released: 06/03/2003 Document Revised: 04/03/2014 Document Reviewed: 10/27/2013 Elsevier Interactive Patient Education 2016 Elsevier Inc.  Osteoarthritis Osteoarthritis is a disease that causes soreness and inflammation of a joint. It occurs when the cartilage at the affected joint wears down. Cartilage acts as a cushion, covering the ends of bones where they meet to form a joint. Osteoarthritis is the most common form of arthritis. It often occurs in older people. The joints affected most often by this condition include those in the:  Ends of the fingers.  Thumbs.  Neck.  Lower back.  Knees.  Hips. CAUSES  Over time, the cartilage that covers the ends of bones begins to wear away. This causes bone to rub on bone, producing pain and stiffness in the affected joints.  RISK FACTORS Certain factors can increase your chances of having osteoarthritis, including:  Older age.  Excessive body weight.  Overuse of joints.  Previous joint injury. SIGNS AND SYMPTOMS   Pain, swelling, and stiffness in the joint.  Over time, the joint may lose its  normal shape.  Small deposits of bone (osteophytes) may grow on the edges of the joint.  Bits of bone or cartilage can break off and float inside the joint space. This may cause more pain and damage. DIAGNOSIS  Your health care provider will do a physical exam and ask about your symptoms. Various tests may be ordered, such as:  X-rays of the affected joint.  Blood tests to rule out other types of arthritis. Additional tests may be used to diagnose your condition. TREATMENT  Goals of treatment are to control pain and improve joint function. Treatment plans may include:  A prescribed exercise program that allows for rest and joint relief.  A weight control plan.  Pain relief techniques, such as:  Properly applied heat and cold.  Electric pulses delivered to nerve endings under the skin (transcutaneous electrical nerve  stimulation [TENS]).  Massage.  Certain nutritional supplements.  Medicines to control pain, such as:  Acetaminophen.  Nonsteroidal anti-inflammatory drugs (NSAIDs), such as naproxen.  Narcotic or central-acting agents, such as tramadol.  Corticosteroids. These can be given orally or as an injection.  Surgery to reposition the bones and relieve pain (osteotomy) or to remove loose pieces of bone and cartilage. Joint replacement may be needed in advanced states of osteoarthritis. HOME CARE INSTRUCTIONS   Take medicines only as directed by your health care provider.  Maintain a healthy weight. Follow your health care provider's instructions for weight control. This may include dietary instructions.  Exercise as directed. Your health care provider can recommend specific types of exercise. These may include:  Strengthening exercises. These are done to strengthen the muscles that support joints affected by arthritis. They can be performed with weights or with exercise bands to add resistance.  Aerobic activities. These are exercises, such as brisk walking or low-impact aerobics, that get your heart pumping.  Range-of-motion activities. These keep your joints limber.  Balance and agility exercises. These help you maintain daily living skills.  Rest your affected joints as directed by your health care provider.  Keep all follow-up visits as directed by your health care provider. SEEK MEDICAL CARE IF:   Your skin turns red.  You develop a rash in addition to your joint pain.  You have worsening joint pain.  You have a fever along with joint or muscle aches. SEEK IMMEDIATE MEDICAL CARE IF:  You have a significant loss of weight or appetite.  You have night sweats. [place] of Arthritis and Musculoskeletal and Skin Diseases: www.niams.SouthExposed.es  [employer] on Aging: http://kim-miller.com/  American College of Rheumatology:  www.rheumatology.org   This information is not intended to replace advice given to you by your health care provider. Make sure you discuss any questions you have with your health care provider.   Document Released: 03/13/2005 Document Revised: 04/03/2014 Document Reviewed: 11/18/2012 Elsevier Interactive Patient Education 2016 [place] for Routine Care of Injuries Theroutine careofmanyinjuriesincludes rest, ice, compression, and elevation (RICE therapy). RICE therapy is often recommended for injuries to soft tissues, such as a muscle strain, ligament injuries, bruises, and overuse injuries. It can also be used for some bony injuries. Using RICE therapy can help to relieve pain, lessen swelling, and enable your body to heal. Rest Rest is required to allow your body to heal. This usually involves reducing your normal activities and avoiding use of the injured part of your body. Generally, you can return to your normal activities when you are comfortable and have  been given permission by your health care provider. Ice Icing your injury helps to keep the swelling down, and it lessens pain. Do not apply ice directly to your skin.  Put ice in a plastic bag.  Place a towel between your skin and the bag.  Leave the ice on for 20 minutes, 2-3 times a day. Do this for as long as you are directed by your health care provider. Compression Compression means putting pressure on the injured area. Compression helps to keep swelling down, gives support, and helps with discomfort. Compression may be done with an elastic bandage. If an elastic bandage has been applied, follow these general tips:  Remove and reapply the bandage every 3-4 hours or as directed by your health care provider.  Make sure the bandage is not wrapped too tightly, because this can cut off circulation. If part of your body beyond the bandage becomes blue, numb, cold, swollen, or more painful, your bandage is most likely  too tight. If this occurs, remove your bandage and reapply it more loosely.  See your health care provider if the bandage seems to be making your problems worse rather than better. Elevation Elevation means keeping the injured area raised. This helps to lessen swelling and decrease pain. If possible, your injured area should be elevated at or above the level of your heart or the center of your chest. [place]? You should seek medical care if:  Your pain and swelling continue.  Your symptoms are getting worse rather than improving. These symptoms may indicate that further evaluation or further X-rays are needed. Sometimes, X-rays may not show a small broken bone (fracture) until a number of days later. Make a follow-up appointment with your health care provider. WHEN SHOULD I SEEK IMMEDIATE MEDICAL CARE? You should seek immediate medical care if:  You have sudden severe pain at or below the area of your injury.  You have redness or increased swelling around your injury.  You have tingling or numbness at or below the area of your injury that does not improve after you remove the elastic bandage.   This information is not intended to replace advice given to you by your health care provider. Make sure you discuss any questions you have with your health care provider.   Document Released: 06/25/2000 Document Revised: 12/02/2014 Document Reviewed: 02/18/2014 Elsevier Interactive Patient Education [employer].

## 2015-05-12 NOTE — ED Notes (Signed)
Pt found to be sleeping when this RN entered room.

## 2015-06-08 ENCOUNTER — Emergency Department (HOSPITAL_COMMUNITY)
Admission: EM | Admit: 2015-06-08 | Discharge: 2015-06-08 | Disposition: A | Discharge status: Home / Self Care | Payer: PPO | Attending: Emergency Medicine | Admitting: Emergency Medicine

## 2015-06-08 ENCOUNTER — Encounter (HOSPITAL_COMMUNITY): Payer: Self-pay | Admitting: Emergency Medicine

## 2015-06-08 DIAGNOSIS — M79662 Pain in left lower leg: Secondary | ICD-10-CM | POA: Diagnosis not present

## 2015-06-08 DIAGNOSIS — R1030 Lower abdominal pain, unspecified: Secondary | ICD-10-CM | POA: Diagnosis not present

## 2015-06-08 DIAGNOSIS — I10 Essential (primary) hypertension: Secondary | ICD-10-CM | POA: Diagnosis not present

## 2015-06-08 LAB — COMPREHENSIVE METABOLIC PANEL
ALBUMIN: 3.8 g/dL (ref 3.5–5.0)
ALK PHOS: 103 U/L (ref 38–126)
ALT: 20 U/L (ref 14–54)
AST: 26 U/L (ref 15–41)
Anion gap: 12 (ref 5–15)
BUN: 5 mg/dL — AB (ref 6–20)
CALCIUM: 9.6 mg/dL (ref 8.9–10.3)
CO2: 25 mmol/L (ref 22–32)
CREATININE: 0.73 mg/dL (ref 0.44–1.00)
Chloride: 106 mmol/L (ref 101–111)
GFR calc Af Amer: >60 mL/min (ref >60)
GFR calc non Af Amer: >60 mL/min (ref >60)
GLUCOSE: 116 mg/dL — AB (ref 65–99)
Potassium: 3.9 mmol/L (ref 3.5–5.1)
SODIUM: 143 mmol/L (ref 135–145)
Total Bilirubin: 0.5 mg/dL (ref 0.3–1.2)
Total Protein: 7.3 g/dL (ref 6.5–8.1)

## 2015-06-08 LAB — URINALYSIS, ROUTINE W REFLEX MICROSCOPIC
BILIRUBIN URINE: NEGATIVE
Glucose, UA: NEGATIVE mg/dL
HGB URINE DIPSTICK: NEGATIVE
Ketones, ur: NEGATIVE mg/dL
Nitrite: NEGATIVE
Protein, ur: NEGATIVE mg/dL
SPECIFIC GRAVITY, URINE: 1.014 (ref 1.005–1.030)
pH: 6 (ref 5.0–8.0)

## 2015-06-08 LAB — CBC
HCT: 41.7 % (ref 36.0–46.0)
HEMOGLOBIN: 13.4 g/dL (ref 12.0–15.0)
MCH: 30.7 pg (ref 26.0–34.0)
MCHC: 32.1 g/dL (ref 30.0–36.0)
MCV: 95.4 fL (ref 78.0–100.0)
PLATELETS: 290 10*3/uL (ref 150–400)
RBC: 4.37 MIL/uL (ref 3.87–5.11)
RDW: 13.9 % (ref 11.5–15.5)
WBC: 14.3 10*3/uL — ABNORMAL HIGH (ref 4.0–10.5)

## 2015-06-08 LAB — URINE MICROSCOPIC-ADD ON

## 2015-06-08 LAB — LIPASE, BLOOD: Lipase: 26 U/L (ref 11–51)

## 2015-06-08 MED ORDER — OXYCODONE-ACETAMINOPHEN 5-325 MG PO TABS
ORAL_TABLET | ORAL | Status: AC
  Filled 2015-06-08: qty 1

## 2015-06-08 MED ORDER — OXYCODONE-ACETAMINOPHEN 5-325 MG PO TABS
1.0000 | ORAL_TABLET | Freq: Once | ORAL | Status: AC
  Administered 2015-06-08: 1 via ORAL

## 2015-06-08 NOTE — ED Notes (Signed)
Pt. reports low abdominal pain with loose stools onset today , denies fever or emesis , pt. added left shin pain with mild swelling onset last week , denies injury.

## 2015-06-08 NOTE — ED Notes (Signed)
Called Pt twice no answer. A visitor that knows that pt, stated that the Pt said she was leaving.

## 2015-06-14 ENCOUNTER — Ambulatory Visit: Payer: PPO | Attending: Family Medicine | Admitting: Family Medicine

## 2015-06-14 ENCOUNTER — Encounter: Payer: Self-pay | Admitting: Family Medicine

## 2015-06-14 VITALS — BP 158/73 | HR 73 | Temp 98.4°F | Resp 16 | Ht 65.0 in | Wt 158.0 lb

## 2015-06-14 DIAGNOSIS — H53422 Scotoma of blind spot area, left eye: Secondary | ICD-10-CM

## 2015-06-14 DIAGNOSIS — Z7982 Long term (current) use of aspirin: Secondary | ICD-10-CM | POA: Diagnosis not present

## 2015-06-14 DIAGNOSIS — H53452 Other localized visual field defect, left eye: Secondary | ICD-10-CM | POA: Insufficient documentation

## 2015-06-14 DIAGNOSIS — I10 Essential (primary) hypertension: Secondary | ICD-10-CM | POA: Diagnosis not present

## 2015-06-14 DIAGNOSIS — Z79899 Other long term (current) drug therapy: Secondary | ICD-10-CM | POA: Insufficient documentation

## 2015-06-14 DIAGNOSIS — M79605 Pain in left leg: Secondary | ICD-10-CM

## 2015-06-14 DIAGNOSIS — M79604 Pain in right leg: Secondary | ICD-10-CM

## 2015-06-14 DIAGNOSIS — H53429 Scotoma of blind spot area, unspecified eye: Secondary | ICD-10-CM | POA: Insufficient documentation

## 2015-06-14 HISTORY — DX: Pain in right leg: M79.604

## 2015-06-14 HISTORY — DX: Pain in left leg: M79.605

## 2015-06-14 MED ORDER — TRAMADOL HCL 50 MG PO TABS: 50.0000 mg | ORAL_TABLET | Freq: Two times a day (BID) | ORAL | Status: DC | PRN

## 2015-06-14 MED ORDER — AMLODIPINE BESYLATE 10 MG PO TABS: 10.0000 mg | ORAL_TABLET | Freq: Every day | ORAL | Status: DC

## 2015-06-14 MED ORDER — CARVEDILOL 6.25 MG PO TABS: 6.2500 mg | ORAL_TABLET | Freq: Two times a day (BID) | ORAL | Status: DC

## 2015-06-14 NOTE — Progress Notes (Signed)
Subjective:  Patient ID: [name], female    DOB: 04/20/1950  Age: 65 y.o. MRN: [id number]  CC: Hypertension   HPI [name] presents for   1. CHRONIC HYPERTENSION  Disease Monitoring  Blood pressure range: not checking   Chest pain: no   Dyspnea: no   Claudication: no   Medication compliance: yes  Medication Side Effects  Lightheadedness: no   Urinary frequency: no    2. Leg pain: has bump on L shin since she was a child. Two weeks ago area became red and tender. No known trauma. Pain is 8/10.  Tramadol and ice has helped.    3. Decrease vision: in L ear. Dark spot in visual field when she looks to the left. Mom with glaucoma. No eye pain or trauma.   Social History  Substance Use Topics  . Smoking status: Former Smoker    Quit date: 07/29/2010  . Smokeless tobacco: Never Used  . Alcohol Use: No    Outpatient Prescriptions Prior to Visit  Medication Sig Dispense Refill  . amLODipine (NORVASC) 10 MG tablet Take 1 tablet (10 mg total) by mouth daily. 30 tablet 11  . aspirin EC 81 MG tablet Take 81 mg by mouth daily.    . Aspirin-Acetaminophen-Caffeine (EXCEDRIN PO) Take 1 tablet by mouth 2 (two) times daily as needed (pain).    . fluticasone (FLONASE) 50 MCG/ACT nasal spray Place 2 sprays into both nostrils daily. 16 g 6  . guaiFENesin (ROBITUSSIN) 100 MG/5ML liquid Take 5-10 mLs (100-200 mg total) by mouth every 4 (four) hours as needed for cough. 60 mL 0  . HYDROcodone-acetaminophen (NORCO/VICODIN) 5-325 MG tablet Take 1-2 tablets by mouth every 6 (six) hours as needed. 15 tablet 0  . ibuprofen (ADVIL,MOTRIN) 400 MG tablet Take 1 tablet (400 mg total) by mouth every 6 (six) hours as needed. 30 tablet 0  . labetalol (NORMODYNE) 200 MG tablet Take 1 tablet (200 mg total) by mouth 2 (two) times daily. 60 tablet 3  . Multiple Vitamin (MULITIVITAMIN WITH MINERALS) TABS Take 1 tablet by mouth daily.    Marland Kitchen omeprazole (PRILOSEC) 20 MG capsule Take 1 capsule (20 mg  total) by mouth 2 (two) times daily before a meal. Take 30 minutes before meals 30 capsule 0  . polyethylene glycol (MIRALAX) packet Take 17 g by mouth daily. 14 each 0  . predniSONE (STERAPRED UNI-PAK 21 TAB) 10 MG (21) TBPK tablet Take 1 tablet (10 mg total) by mouth daily. Take 6 tabs by mouth daily  for 2 days, then 5 tabs for 2 days, then 4 tabs for 2 days, then 3 tabs for 2 days, 2 tabs for 2 days, then 1 tab by mouth daily for 2 days 42 tablet 0  . Simethicone 125 MG CAPS Take 1 capsule (125 mg total) by mouth 4 (four) times daily as needed. 28 each 0  . traMADol (ULTRAM) 50 MG tablet Take 50 mg by mouth every 6 (six) hours as needed for moderate pain.     No facility-administered medications prior to visit.    ROS Review of Systems  Constitutional: Negative for fever and chills.  Eyes: Negative for visual disturbance.  Respiratory: Negative for shortness of breath.   Cardiovascular: Negative for chest pain.  Gastrointestinal: Negative for abdominal pain and blood in stool.  Musculoskeletal: Positive for arthralgias. Negative for back pain.  Skin: Negative for rash.  Allergic/Immunologic: Negative for immunocompromised state.  Hematological: Negative for adenopathy. Does not bruise/bleed easily.  Psychiatric/Behavioral: Negative for suicidal ideas and dysphoric mood.    Objective:  BP 158/73 mmHg  Pulse 73  Temp(Src) 98.4 F (36.9 C)  Resp 16  Ht 5\' 5"  (1.651 m)  Wt 158 lb (71.668 kg)  BMI 26.29 kg/m2  SpO2 98%  BP/Weight 06/14/2015 06/08/2015 [id number]  Systolic BP 0000000 [id number]  Diastolic BP 73 74 57  Wt. (Lbs) 158 155 -  BMI 26.29 25.79 25.83   Physical Exam  Constitutional: She is oriented to person, place, and time. She appears well-developed and well-nourished. No distress.  HENT:  Head: Normocephalic and atraumatic.  Mouth/Throat: Abnormal dentition.  Cardiovascular: Normal rate, regular rhythm, normal heart sounds and intact distal pulses.   Pulmonary/Chest:  Effort normal and breath sounds normal.  Musculoskeletal: She exhibits no edema.       Legs: Neurological: She is alert and oriented to person, place, and time.  Skin: Skin is warm and dry. No rash noted.  Psychiatric: She has a normal mood and affect.     Assessment & Plan:   There are no diagnoses linked to this encounter. [name] was seen today for hypertension.  Diagnoses and all orders for this visit:  Essential hypertension -     carvedilol (COREG) 6.25 MG tablet; Take 1 tablet (6.25 mg total) by mouth 2 (two) times daily with a meal. -     amLODipine (NORVASC) 10 MG tablet; Take 1 tablet (10 mg total) by mouth daily.  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   Meds ordered this encounter  Medications  . carvedilol (COREG) 6.25 MG tablet    Sig: Take 1 tablet (6.25 mg total) by mouth 2 (two) times daily with a meal.    Dispense:  60 tablet    Refill:  3  . amLODipine (NORVASC) 10 MG tablet    Sig: Take 1 tablet (10 mg total) by mouth daily.    Dispense:  30 tablet    Refill:  11  . traMADol (ULTRAM) 50 MG tablet    Sig: Take 1 tablet (50 mg total) by mouth every 12 (twelve) hours as needed for moderate pain.    Dispense:  60 tablet    Refill:  0    Follow-up: No Follow-up on file.   Boykin Nearing MD

## 2015-06-14 NOTE — Patient Instructions (Addendum)
[name] was seen today for hypertension.  Diagnoses and all orders for this visit:  Essential hypertension -     carvedilol (COREG) 6.25 MG tablet; Take 1 tablet (6.25 mg total) by mouth 2 (two) times daily with a meal. -     amLODipine (NORVASC) 10 MG tablet; Take 1 tablet (10 mg total) by mouth daily.  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  STOP labetalol   F/u in 4 weeks for HTN and pap smear   Dr. Adrian Blackwater

## 2015-06-14 NOTE — Progress Notes (Signed)
F/U HTN C/C knot on lt lowe leg  Pain scale #8 Former smoker No suicidal thoughts in the past two weeks

## 2015-06-14 NOTE — Assessment & Plan Note (Signed)
A: HTN elevated BP Med: compliant P:  Change to coreg Continue norvasc 10 mg daily

## 2015-06-14 NOTE — Assessment & Plan Note (Signed)
Opthalmology referral placed  

## 2015-06-14 NOTE — Assessment & Plan Note (Signed)
A: leg pain with firm nodule P: Leg x-ray ordered Tramadol for pain

## 2015-06-15 ENCOUNTER — Encounter: Payer: Self-pay | Admitting: Family Medicine

## 2015-06-22 ENCOUNTER — Ambulatory Visit (HOSPITAL_COMMUNITY)
Admission: RE | Admit: 2015-06-22 | Discharge: 2015-06-22 | Disposition: A | Discharge status: Home / Self Care | Payer: PPO | Source: Ambulatory Visit | Attending: Family Medicine | Admitting: Family Medicine

## 2015-06-22 DIAGNOSIS — M79662 Pain in left lower leg: Secondary | ICD-10-CM | POA: Diagnosis not present

## 2015-06-22 DIAGNOSIS — M79605 Pain in left leg: Secondary | ICD-10-CM

## 2015-06-25 ENCOUNTER — Telehealth: Payer: Self-pay | Admitting: *Deleted

## 2015-06-25 NOTE — Telephone Encounter (Signed)
-----   Message from Arnoldo Morale, MD sent at 06/25/2015  8:10 AM EDT ----- Please inform her that her x-ray of the left tibia reveals no acute bony abnormality

## 2015-06-25 NOTE — Telephone Encounter (Signed)
LVM to return call.

## 2015-06-26 ENCOUNTER — Emergency Department (HOSPITAL_COMMUNITY)
Admission: EM | Admit: 2015-06-26 | Discharge: 2015-06-26 | Disposition: A | Discharge status: Home / Self Care | Payer: PPO | Attending: Emergency Medicine | Admitting: Emergency Medicine

## 2015-06-26 ENCOUNTER — Encounter (HOSPITAL_COMMUNITY): Payer: Self-pay | Admitting: Emergency Medicine

## 2015-06-26 DIAGNOSIS — R350 Frequency of micturition: Secondary | ICD-10-CM | POA: Insufficient documentation

## 2015-06-26 DIAGNOSIS — Z7951 Long term (current) use of inhaled steroids: Secondary | ICD-10-CM | POA: Insufficient documentation

## 2015-06-26 DIAGNOSIS — Z79899 Other long term (current) drug therapy: Secondary | ICD-10-CM | POA: Insufficient documentation

## 2015-06-26 DIAGNOSIS — Z8639 Personal history of other endocrine, nutritional and metabolic disease: Secondary | ICD-10-CM | POA: Diagnosis not present

## 2015-06-26 DIAGNOSIS — R011 Cardiac murmur, unspecified: Secondary | ICD-10-CM | POA: Insufficient documentation

## 2015-06-26 DIAGNOSIS — M1990 Unspecified osteoarthritis, unspecified site: Secondary | ICD-10-CM | POA: Insufficient documentation

## 2015-06-26 DIAGNOSIS — Z87828 Personal history of other (healed) physical injury and trauma: Secondary | ICD-10-CM | POA: Diagnosis not present

## 2015-06-26 DIAGNOSIS — M545 Low back pain, unspecified: Secondary | ICD-10-CM

## 2015-06-26 DIAGNOSIS — I10 Essential (primary) hypertension: Secondary | ICD-10-CM | POA: Insufficient documentation

## 2015-06-26 DIAGNOSIS — Z91040 Latex allergy status: Secondary | ICD-10-CM | POA: Diagnosis not present

## 2015-06-26 DIAGNOSIS — K029 Dental caries, unspecified: Secondary | ICD-10-CM

## 2015-06-26 DIAGNOSIS — Z7982 Long term (current) use of aspirin: Secondary | ICD-10-CM | POA: Insufficient documentation

## 2015-06-26 DIAGNOSIS — R110 Nausea: Secondary | ICD-10-CM | POA: Diagnosis not present

## 2015-06-26 DIAGNOSIS — Z880 Allergy status to penicillin: Secondary | ICD-10-CM | POA: Insufficient documentation

## 2015-06-26 DIAGNOSIS — Z87891 Personal history of nicotine dependence: Secondary | ICD-10-CM | POA: Insufficient documentation

## 2015-06-26 DIAGNOSIS — K0889 Other specified disorders of teeth and supporting structures: Secondary | ICD-10-CM | POA: Diagnosis not present

## 2015-06-26 LAB — URINALYSIS, ROUTINE W REFLEX MICROSCOPIC
BILIRUBIN URINE: NEGATIVE
Glucose, UA: NEGATIVE mg/dL
Hgb urine dipstick: NEGATIVE
Ketones, ur: NEGATIVE mg/dL
Leukocytes, UA: NEGATIVE
NITRITE: NEGATIVE
PH: 5.5 (ref 5.0–8.0)
Protein, ur: NEGATIVE mg/dL
SPECIFIC GRAVITY, URINE: 1.014 (ref 1.005–1.030)

## 2015-06-26 MED ORDER — CLINDAMYCIN HCL 300 MG PO CAPS
300.0000 mg | ORAL_CAPSULE | Freq: Four times a day (QID) | ORAL | Status: DC
Start: 2015-06-26 — End: 2015-08-08

## 2015-06-26 MED ORDER — KETOROLAC TROMETHAMINE 30 MG/ML IJ SOLN
30.0000 mg | Freq: Once | INTRAMUSCULAR | Status: AC
  Administered 2015-06-26: 30 mg via INTRAMUSCULAR
  Filled 2015-06-26: qty 1

## 2015-06-26 MED ORDER — METHOCARBAMOL 500 MG PO TABS: 500.0000 mg | ORAL_TABLET | Freq: Two times a day (BID) | ORAL | Status: DC

## 2015-06-26 MED ORDER — NAPROXEN 500 MG PO TABS: 500.0000 mg | ORAL_TABLET | Freq: Two times a day (BID) | ORAL | Status: DC

## 2015-06-26 MED ORDER — AMOXICILLIN-POT CLAVULANATE 875-125 MG PO TABS: 1.0000 | ORAL_TABLET | Freq: Two times a day (BID) | ORAL | Status: DC

## 2015-06-26 MED ORDER — ACETAMINOPHEN 325 MG PO TABS
650.0000 mg | ORAL_TABLET | Freq: Once | ORAL | Status: AC
  Administered 2015-06-26: 650 mg via ORAL
  Filled 2015-06-26: qty 2

## 2015-06-26 NOTE — ED Provider Notes (Signed)
CSN: [id number]     Arrival date & time 06/26/15  1226 History  By signing my name below, I, Dora Sims, attest that this documentation has been prepared under the direction and in the presence of non-physician practitioner, Delrae Rend, PA-C. Electronically Signed: Dora Sims, Scribe. 06/26/2015. 12:37 PM.   Chief Complaint  Patient presents with  . Back Pain    The history is provided by the patient. No language interpreter was used.     HPI Comments: [name] is a 65 y.o. female with h/o HTN, HLD, and arthritis who presents to the Emergency Department complaining of sudden onset, constant, worsening, right-sided lower back pain beginning yesterday. Pt notes that she woke up yesterday morning with significant right-sided lower back pain and denies any recent exercise or activity change. She endorses lower back pain exacerbation with movement. Pt has applied heat to her right lower back with no relief; she also took Tylenol around 7AM this morning with minimal relief. She endorses associated increased urinary frequency and intermittent nausea as well. Pt states that she does not experience lower back pain at baseline. She denies dysuria, hematuria, abdominal pain, vomiting, or any other associated symptoms.  Pt additionally complains of left-sided dental pain. She notes that she has experienced dental pain for an extended period of time and has taken antibiotics previously with no relief. 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  Past Medical History  Diagnosis Date  . Meniscus tear     L KNEE  . Arthritis 2000  . Hypertension 2010  . Abnormal EKG 2012    hospitalized for T wave inversion in lateral leads with MSK chest pains, normal ECHO, negative trops, no cardiology consult. recent EKG 7/15 stable T wave inversions.   . Heart murmur   . Hyperlipidemia    Past Surgical History  Procedure Laterality  Date  . Knee surgery Right 1999  . Tubal ligation  1979  . Abdominal surgery  2010    for bowel blockage  . Knee arthroscopy Left 09/25/2013    Procedure: LEFT KNEE ARTHROSCOPY WITH PARTIAL MEDIAL AND LATERAL  MENISCECTOMY/DEBRIDEMENT/MICRO FRACTURE MEDIAL FEMORAL CONDYLE, REMOVAL OF OSTEOCONDRAL FRAGMENT;  Surgeon: Johnn Hai, MD;  Location: [facility];  Service: Orthopedics;  Laterality: Left;   Family History  Problem Relation Age of Onset  . Hypertension Mother   . Heart disease Mother     has a pacemaker   . Heart failure Father   . Hypertension Father   . Cancer Neg Hx   . Colon cancer Neg Hx    Social History  Substance Use Topics  . Smoking status: Former Smoker    Quit date: 07/29/2010  . Smokeless tobacco: Never Used  . Alcohol Use: No   OB History    No data available     Review of Systems  HENT: Positive for dental problem (left).   Gastrointestinal: Positive for nausea. Negative for vomiting and abdominal pain.  Genitourinary: Positive for frequency. Negative for dysuria.       Negative for hematuria  Musculoskeletal: Positive for back pain (right-sided, lower).  All other systems reviewed and are negative.     Allergies  Shrimp; Latex; and Penicillins  Home Medications   Prior to Admission medications   Medication Sig Start Date End Date Taking? Authorizing Provider  amLODipine (NORVASC) 10 MG tablet Take 1 tablet (10 mg total) by mouth daily. 06/14/15  Boykin Nearing, MD  aspirin EC 81 MG tablet Take 81 mg by mouth daily.    Historical Provider, MD  Aspirin-Acetaminophen-Caffeine (EXCEDRIN PO) Take 1 tablet by mouth 2 (two) times daily as needed (pain).    Historical Provider, MD  carvedilol (COREG) 6.25 MG tablet Take 1 tablet (6.25 mg total) by mouth 2 (two) times daily with a meal. 06/14/15   Josalyn Funches, MD  fluticasone (FLONASE) 50 MCG/ACT nasal spray Place 2 sprays into both nostrils daily. 03/09/15   Josalyn Funches, MD  ibuprofen  (ADVIL,MOTRIN) 400 MG tablet Take 1 tablet (400 mg total) by mouth every 6 (six) hours as needed. 04/11/15   Comer Locket, PA-C  Multiple Vitamin (MULITIVITAMIN WITH MINERALS) TABS Take 1 tablet by mouth daily.    Historical Provider, MD  omeprazole (PRILOSEC) 20 MG capsule Take 1 capsule (20 mg total) by mouth 2 (two) times daily before a meal. Take 30 minutes before meals 11/10/14   Josalyn Funches, MD  polyethylene glycol (MIRALAX) packet Take 17 g by mouth daily. 02/21/15   Vivi Barrack, MD  traMADol (ULTRAM) 50 MG tablet Take 1 tablet (50 mg total) by mouth every 12 (twelve) hours as needed for moderate pain. 06/14/15   Josalyn Funches, MD   BP 138/71 mmHg  Pulse 64  Temp(Src) 97.8 F (36.6 C) (Oral)  Resp 18  Ht 5\' 5"  (1.651 m)  Wt 157 lb (71.215 kg)  BMI 26.13 kg/m2  SpO2 100% Physical Exam  Constitutional: She is oriented to person, place, and time. She appears well-developed and well-nourished. No distress.  Appears uncomfortable but NAD  HENT:  Head: Normocephalic and atraumatic.  Mouth with multiple edentulous areas. Remaining teeth are grossly carious with much plaque buildup and gingival edema. There are no visible abscesses. No posterior oropharyngeal edema. Uvula midline. No trismus.   Eyes: Conjunctivae and EOM are normal.  Neck: Neck supple. No tracheal deviation present.  Cardiovascular: Normal rate.   Pulmonary/Chest: Effort normal. No respiratory distress.  Abdominal:  No abdominal tenderness. Abdomen is soft. No guarding or rebound. No CVA tenderness.  Musculoskeletal: Normal range of motion.  +R SI joint tenderness and bilateral right lumbar paraspinal tenderness. No c-spine, t-spine, or l-spine tenderness. No stepoff or deformity.  Strength intact in bilateral UE and LE  Neurological: She is alert and oriented to person, place, and time.  Skin: Skin is warm and dry.  Psychiatric: She has a normal mood and affect. Her behavior is normal.  Nursing note and  vitals reviewed.   ED Course  Procedures (including critical care time)  DIAGNOSTIC STUDIES: Oxygen Saturation is 100% on RA, normal by my interpretation.    COORDINATION OF CARE: 12:37 PM Will administer Toradol injection and tylenol tablet 650 mg. Will order Urinalysis. Discussed treatment plan with pt at bedside and pt agreed to plan.  Labs Review Labs Reviewed - No data to display  Imaging Review No results found. I have personally reviewed and evaluated these lab results as part of my medical decision-making.   EKG Interpretation None      MDM   Final diagnoses:  Right-sided low back pain without sciatica  Pain due to dental caries    UA negative. Suspect msk strain/spasm, possible sacroiliitis. Pain improved with toradol and tylenol in the ED. Neuro exam is intact. No red flags for cauda equina, epidural abscess, or other emergent spinal pathology. Pt has tramadol at home for chronic knee arthritis. Rx given for naproxen and robaxin as well. Instructed  to f/u with PCP for ongoing evaluation and management of back pain. ER return precautions given.  Pt also with multiple teeth requiring extraction. No abscesses to drain in the ED at this time. Pt declines dental block. Pain improved with toradol and tylenol. Rx given for clinda given pt's PCN allergy. Dental resource guide given. Discussed with pt she will need to see dentist within one week for definitive treatment of her dental issues.   I personally performed the services described in this documentation, which was scribed in my presence. The recorded information has been reviewed and is accurate.    Anne Ng, PA-C 06/26/15 1343  Tanna Furry, MD 07/01/15 2329

## 2015-06-26 NOTE — ED Notes (Signed)
Pt complaint of lower back pain onset yesterday; denies injury or hx of same.

## 2015-06-26 NOTE — Discharge Instructions (Signed)
You may take the Tramadol you have at home in addition to the prescriptions I gave you to help with your back pain. The pain medicine should help with your toothache as well. Please take the entire course of antibiotics as prescribed. Please see a dentist as soon as possible for further evaluation and possible extractions of your teeth. Return to the ER for new or worsening symptoms.[facility] [employer] [address] is a great source of information about community services available.  Access by dialing 2-1-1 from anywhere in [place], or by website -  CustodianSupply.fi.   Other Local Resources (Updated 03/2015)  Dental  Care   Services    Phone Number and Address  Cost  [facility] For children 21 - 57 years of age:   Cleaning  Tooth brushing/flossing instruction  Sealants, fillings, crowns  Extractions  Emergency treatment  [phone] [address], [place] [address] Charges based on family income.  Medicaid and some insurance plans accepted.     [facility] - [facility], fillings, crowns  Extractions  Emergency treatment [phone] [address] [place], [place]  Pregnant women 67 years of age or older with a Medicaid card  [facility] - [place]  Cleaning  Sealants, fillings, crowns  Extractions  Emergency treatment [phone] [address] [place], [place] Pregnant women 64 years of age or older with a Medicaid card  [facility] For children 58 - 7 years of age:   Cleaning  Tooth brushing/flossing instruction  Sealants, fillings, crowns  Extractions  Emergency treatment Limited orthodontic services for patients with Medicaid [phone] [address], [place] [address] Medicaid and [facility] Health Choice cover for children up to age 10  and pregnant women.  Parents of children up to age 61 without Medicaid pay a reduced fee at time of service.  [facility] For children 13 - 70 years of age:   Cleaning  Tooth brushing/flossing instruction  Sealants, fillings, crowns  Extractions  Emergency treatment Limited orthodontic services for patients with Medicaid [phone] [place], [place].  Medicaid and [place] Health Choice cover for children up to age 76 and pregnant women.  Parents of children up to age 67 without Medicaid pay a reduced fee.  [facility]  Sealants, fillings, crowns  Extractions  Hours: Tuesdays and Thursdays, 4:15 - 8 pm [phone] [address] [place], [place] [address] Services free of charge to [facility] residents ages 18-64 who do not have health insurance, Medicare, [place], or [place] benefits and fall within federal poverty guidelines  [place] care in addition to primary medical care, nutritional counseling, and pharmacy:  Engineer, drilling, fillings, crowns  Extractions                  [phone] [facility], [place], [place], [place], [place], [facility], [facility], [facility] [place], [place], [place], most insurance.  Also provides services available to all with fees adjusted based on ability  to pay.    [facility]  Cleaning  Tooth brushing/flossing instruction  Sealants, fillings, crowns  Extractions  Emergency treatment Hours: Tuesdays, Thursdays, and Fridays  from 8 am to 5 pm by appointment only. [phone] [place], [place] [address] [facility] residents with Medicaid (depending on eligibility) and children with [facility] Health Choice - call for more information.  [facility]  Extractions only  Hours: 2nd and 4th Thursday of each month from 6:30 am - 9 am.   [phone] ext. [address] [place], [place] [address] Ages 44 and older only.  Patients are seen on a first come, first served basis.  [employer]  [employer]  Extractions  Orthodontics  Endodontics  Implants/Crowns/Bridges  Complete and partial dentures [phone] [place], [place] Patients must complete an application for services.  There is often a waiting list.

## 2015-06-28 NOTE — Telephone Encounter (Signed)
Pt. Returned call. Please f/u with pt. °

## 2015-06-28 NOTE — Telephone Encounter (Signed)
Pt returned call  Xray results given  Pt verbalized understanding

## 2015-06-29 ENCOUNTER — Telehealth: Payer: Self-pay | Admitting: *Deleted

## 2015-06-29 DIAGNOSIS — H539 Unspecified visual disturbance: Secondary | ICD-10-CM | POA: Diagnosis not present

## 2015-06-29 DIAGNOSIS — H04123 Dry eye syndrome of bilateral lacrimal glands: Secondary | ICD-10-CM | POA: Diagnosis not present

## 2015-06-29 DIAGNOSIS — H2513 Age-related nuclear cataract, bilateral: Secondary | ICD-10-CM | POA: Diagnosis not present

## 2015-06-30 ENCOUNTER — Telehealth: Payer: Self-pay | Admitting: Family Medicine

## 2015-06-30 DIAGNOSIS — M47816 Spondylosis without myelopathy or radiculopathy, lumbar region: Secondary | ICD-10-CM

## 2015-06-30 DIAGNOSIS — M545 Low back pain, unspecified: Secondary | ICD-10-CM

## 2015-06-30 NOTE — Telephone Encounter (Signed)
Patient called stating that she was prescribed methocarbamol (ROBAXIN) 500 MG tablet at the hospital, patient stated that her insurance needs prior auth in order to get the medication. Patients insurance phone number: [phone]   Patient stated that if her insurance does not approve this medication she would like to have something else prescribed similar to the medication.   Please f/u with pt.

## 2015-07-01 MED ORDER — CYCLOBENZAPRINE HCL 10 MG PO TABS: 10.0000 mg | ORAL_TABLET | Freq: Three times a day (TID) | ORAL | Status: DC | PRN

## 2015-07-01 NOTE — Telephone Encounter (Signed)
Med changed to flexeril which is a similar muscle relaxer Please inform patient

## 2015-07-02 DIAGNOSIS — H04123 Dry eye syndrome of bilateral lacrimal glands: Secondary | ICD-10-CM | POA: Diagnosis not present

## 2015-07-02 DIAGNOSIS — H5310 Unspecified subjective visual disturbances: Secondary | ICD-10-CM | POA: Diagnosis not present

## 2015-07-02 DIAGNOSIS — H2513 Age-related nuclear cataract, bilateral: Secondary | ICD-10-CM | POA: Diagnosis not present

## 2015-07-05 ENCOUNTER — Telehealth: Payer: Self-pay | Admitting: Family Medicine

## 2015-07-05 NOTE — Telephone Encounter (Signed)
Patient called stating that Flexeril is not covered by her insurance and is requesting an alternative. Please follow up.

## 2015-07-08 DIAGNOSIS — M545 Low back pain: Principal | ICD-10-CM

## 2015-07-08 DIAGNOSIS — M549 Dorsalgia, unspecified: Secondary | ICD-10-CM | POA: Insufficient documentation

## 2015-07-08 MED ORDER — TIZANIDINE HCL 4 MG PO TABS: 4.0000 mg | ORAL_TABLET | Freq: Four times a day (QID) | ORAL | Status: DC | PRN

## 2015-07-08 NOTE — Telephone Encounter (Signed)
Pt stated Rx Flexeril no cover by insurance  Pt insurance [employer]

## 2015-07-08 NOTE — Telephone Encounter (Signed)
Looking for alternatives What insurance does patient have?

## 2015-07-08 NOTE — Telephone Encounter (Signed)
zanaflex covered by her insurance and sent in.

## 2015-07-23 ENCOUNTER — Other Ambulatory Visit: Payer: Self-pay | Admitting: Family Medicine

## 2015-07-23 DIAGNOSIS — M1712 Unilateral primary osteoarthritis, left knee: Secondary | ICD-10-CM

## 2015-07-23 DIAGNOSIS — M545 Low back pain, unspecified: Secondary | ICD-10-CM

## 2015-07-23 DIAGNOSIS — M47816 Spondylosis without myelopathy or radiculopathy, lumbar region: Secondary | ICD-10-CM

## 2015-07-23 NOTE — Telephone Encounter (Signed)
Patient called and requested a med refill for traMADol (ULTRAM) 50 MG tablet. Please f/u with pt.

## 2015-07-27 NOTE — Telephone Encounter (Signed)
Tramadol refilled.

## 2015-07-27 NOTE — Telephone Encounter (Signed)
Pt pharmacy [facility] requesting Refill Rx Tramadol

## 2015-07-28 NOTE — Telephone Encounter (Signed)
Pt notified Rx at front office ready to be pickup 

## 2015-08-05 ENCOUNTER — Telehealth: Payer: Self-pay | Admitting: Family Medicine

## 2015-08-05 NOTE — Telephone Encounter (Signed)
Pt. Is calling in regards to signing up for ensure.  Pt. States nurse mentioned she should sign up for Ensure.  Please follow up with patient as to were she should sign up or does she get the supply from our clinic

## 2015-08-08 ENCOUNTER — Emergency Department (HOSPITAL_COMMUNITY)
Admission: EM | Admit: 2015-08-08 | Discharge: 2015-08-08 | Disposition: A | Discharge status: Home / Self Care | Payer: PPO | Attending: Emergency Medicine | Admitting: Emergency Medicine

## 2015-08-08 ENCOUNTER — Emergency Department (HOSPITAL_COMMUNITY): Payer: PPO

## 2015-08-08 ENCOUNTER — Encounter (HOSPITAL_COMMUNITY): Payer: Self-pay | Admitting: *Deleted

## 2015-08-08 DIAGNOSIS — Z8639 Personal history of other endocrine, nutritional and metabolic disease: Secondary | ICD-10-CM | POA: Insufficient documentation

## 2015-08-08 DIAGNOSIS — I10 Essential (primary) hypertension: Secondary | ICD-10-CM | POA: Insufficient documentation

## 2015-08-08 DIAGNOSIS — Z79899 Other long term (current) drug therapy: Secondary | ICD-10-CM | POA: Insufficient documentation

## 2015-08-08 DIAGNOSIS — M1990 Unspecified osteoarthritis, unspecified site: Secondary | ICD-10-CM | POA: Diagnosis not present

## 2015-08-08 DIAGNOSIS — M545 Low back pain, unspecified: Secondary | ICD-10-CM

## 2015-08-08 DIAGNOSIS — Z7982 Long term (current) use of aspirin: Secondary | ICD-10-CM | POA: Diagnosis not present

## 2015-08-08 DIAGNOSIS — Z87891 Personal history of nicotine dependence: Secondary | ICD-10-CM | POA: Diagnosis not present

## 2015-08-08 DIAGNOSIS — R011 Cardiac murmur, unspecified: Secondary | ICD-10-CM | POA: Diagnosis not present

## 2015-08-08 DIAGNOSIS — R42 Dizziness and giddiness: Secondary | ICD-10-CM | POA: Diagnosis not present

## 2015-08-08 DIAGNOSIS — G8929 Other chronic pain: Secondary | ICD-10-CM | POA: Diagnosis not present

## 2015-08-08 DIAGNOSIS — Z87828 Personal history of other (healed) physical injury and trauma: Secondary | ICD-10-CM | POA: Diagnosis not present

## 2015-08-08 DIAGNOSIS — M549 Dorsalgia, unspecified: Secondary | ICD-10-CM | POA: Insufficient documentation

## 2015-08-08 DIAGNOSIS — Z91040 Latex allergy status: Secondary | ICD-10-CM | POA: Diagnosis not present

## 2015-08-08 DIAGNOSIS — Z880 Allergy status to penicillin: Secondary | ICD-10-CM | POA: Insufficient documentation

## 2015-08-08 LAB — BASIC METABOLIC PANEL
ANION GAP: 11 (ref 5–15)
BUN: 7 mg/dL (ref 6–20)
CALCIUM: 9.2 mg/dL (ref 8.9–10.3)
CO2: 27 mmol/L (ref 22–32)
CREATININE: 0.71 mg/dL (ref 0.44–1.00)
Chloride: 104 mmol/L (ref 101–111)
Glucose, Bld: 88 mg/dL (ref 65–99)
Potassium: 3.6 mmol/L (ref 3.5–5.1)
SODIUM: 142 mmol/L (ref 135–145)

## 2015-08-08 LAB — URINALYSIS, ROUTINE W REFLEX MICROSCOPIC
BILIRUBIN URINE: NEGATIVE
Glucose, UA: NEGATIVE mg/dL
HGB URINE DIPSTICK: NEGATIVE
KETONES UR: NEGATIVE mg/dL
Leukocytes, UA: NEGATIVE
Nitrite: NEGATIVE
PH: 6 (ref 5.0–8.0)
Protein, ur: NEGATIVE mg/dL
SPECIFIC GRAVITY, URINE: 1.015 (ref 1.005–1.030)

## 2015-08-08 LAB — CBC WITH DIFFERENTIAL/PLATELET
BASOS ABS: 0.1 10*3/uL (ref 0.0–0.1)
Basophils Relative: 1 %
EOS ABS: 0.3 10*3/uL (ref 0.0–0.7)
Eosinophils Relative: 3 %
HCT: 37.9 % (ref 36.0–46.0)
Hemoglobin: 12 g/dL (ref 12.0–15.0)
LYMPHS ABS: 2.2 10*3/uL (ref 0.7–4.0)
Lymphocytes Relative: 24 %
MCH: 30.2 pg (ref 26.0–34.0)
MCHC: 31.7 g/dL (ref 30.0–36.0)
MCV: 95.5 fL (ref 78.0–100.0)
Monocytes Absolute: 0.6 10*3/uL (ref 0.1–1.0)
Monocytes Relative: 6 %
NEUTROS ABS: 6 10*3/uL (ref 1.7–7.7)
Neutrophils Relative %: 66 %
PLATELETS: 281 10*3/uL (ref 150–400)
RBC: 3.97 MIL/uL (ref 3.87–5.11)
RDW: 13.4 % (ref 11.5–15.5)
WBC: 9.2 10*3/uL (ref 4.0–10.5)

## 2015-08-08 MED ORDER — IBUPROFEN 600 MG PO TABS: 600.0000 mg | ORAL_TABLET | Freq: Four times a day (QID) | ORAL | Status: DC | PRN

## 2015-08-08 MED ORDER — TIZANIDINE HCL 4 MG PO TABS: 4.0000 mg | ORAL_TABLET | Freq: Four times a day (QID) | ORAL | Status: DC | PRN

## 2015-08-08 MED ORDER — IBUPROFEN 400 MG PO TABS
600.0000 mg | ORAL_TABLET | Freq: Once | ORAL | Status: AC
  Administered 2015-08-08: 600 mg via ORAL
  Filled 2015-08-08: qty 1

## 2015-08-08 NOTE — ED Provider Notes (Signed)
CSN: [id number]     Arrival date & time 08/08/15  0535 History   First MD Initiated Contact with Patient 08/08/15 [phone]     Chief Complaint  Patient presents with  . Generalized Body Aches     (Consider location/radiation/quality/duration/timing/severity/associated sxs/prior Treatment) The history is provided by the patient.    Pt presents with low back pain with radiation down the back of her bilateral legs.   The back pain has been ongoing for about 1 year, seems worse over the past 2 days.  States the pain down the backs of her legs just began over the past week.  Has been prescribed tramadol and zanaflex for same.  The zanaflex has helped but she only has one pill left.  Does have urinary frequency that is unchanged for months, has had negative UA previously.  Denies any dysuria.  Also notes she woke up with bruises on her anterior thighs that appeared and she does not know why.  She also c/o left ear, feels she is hearing a washing machine in it.  This has been ongoing for some time, is intermittent, there is no pain or drainage or fevers associated with it.   Denies fevers, chills, abdominal pain, loss of control of bowel or bladder, weakness of numbness of the extremities, saddle anesthesia, bowel or urinary changes.  Denies hx CA, IVDU.   Past Medical History  Diagnosis Date  . Meniscus tear     L KNEE  . Arthritis 2000  . Hypertension 2010  . Abnormal EKG 2012    hospitalized for T wave inversion in lateral leads with MSK chest pains, normal ECHO, negative trops, no cardiology consult. recent EKG 7/15 stable T wave inversions.   . Heart murmur   . Hyperlipidemia    Past Surgical History  Procedure Laterality Date  . Knee surgery Right 1999  . Tubal ligation  1979  . Abdominal surgery  2010    for bowel blockage  . Knee arthroscopy Left 09/25/2013    Procedure: LEFT KNEE ARTHROSCOPY WITH PARTIAL MEDIAL AND LATERAL  MENISCECTOMY/DEBRIDEMENT/MICRO FRACTURE MEDIAL FEMORAL CONDYLE,  REMOVAL OF OSTEOCONDRAL FRAGMENT;  Surgeon: Javier Docker, MD;  Location: [facility];  Service: Orthopedics;  Laterality: Left;   Family History  Problem Relation Age of Onset  . Hypertension Mother   . Heart disease Mother     has a pacemaker   . Heart failure Father   . Hypertension Father   . Cancer Neg Hx   . Colon cancer Neg Hx    Social History  Substance Use Topics  . Smoking status: Former Smoker    Quit date: 07/29/2010  . Smokeless tobacco: Never Used  . Alcohol Use: No   OB History    No data available     Review of Systems  All other systems reviewed and are negative.     Allergies  Shrimp; Latex; and Penicillins  Home Medications   Prior to Admission medications   Medication Sig Start Date End Date Taking? Authorizing Provider  amLODipine (NORVASC) 10 MG tablet Take 1 tablet (10 mg total) by mouth daily. 06/14/15   Dessa Phi, MD  aspirin EC 81 MG tablet Take 81 mg by mouth daily.    Historical Provider, MD  Aspirin-Acetaminophen-Caffeine (EXCEDRIN PO) Take 1 tablet by mouth 2 (two) times daily as needed (pain).    Historical Provider, MD  carvedilol (COREG) 6.25 MG tablet Take 1 tablet (6.25 mg total) by mouth 2 (two) times daily with  a meal. 06/14/15   Boykin Nearing, MD  ibuprofen (ADVIL,MOTRIN) 400 MG tablet Take 1 tablet (400 mg total) by mouth every 6 (six) hours as needed. 04/11/15   Comer Locket, PA-C  Multiple Vitamin (MULITIVITAMIN WITH MINERALS) TABS Take 1 tablet by mouth daily.    Historical Provider, MD  naproxen (NAPROSYN) 500 MG tablet Take 1 tablet (500 mg total) by mouth 2 (two) times daily. 06/26/15   Olivia Canter Sam, PA-C  omeprazole (PRILOSEC) 20 MG capsule Take 1 capsule (20 mg total) by mouth 2 (two) times daily before a meal. Take 30 minutes before meals 11/10/14   Josalyn Funches, MD  polyethylene glycol (MIRALAX) packet Take 17 g by mouth daily. 02/21/15   Vivi Barrack, MD  tiZANidine (ZANAFLEX) 4 MG tablet Take 1 tablet (4 mg  total) by mouth every 6 (six) hours as needed for muscle spasms. 07/08/15   Josalyn Funches, MD  traMADol (ULTRAM) 50 MG tablet TAKE 1 TABLET BY MOUTH EVERY 12 HOURS AS NEEDED FOR MODERATE PAIN 07/27/15   Josalyn Funches, MD   BP 169/79 mmHg  Pulse 73  Temp(Src) 97.5 F (36.4 C) (Oral)  Resp 17  Ht 5\' 5"  (1.651 m)  Wt 70.308 kg  BMI 25.79 kg/m2  SpO2 96% Physical Exam  Constitutional: She appears well-developed and well-nourished. No distress.  HENT:  Head: Normocephalic and atraumatic.  Right TM and canal normal.  Left TM scarred, canal with small amount of wax.    Neck: Neck supple.  Pulmonary/Chest: Effort normal.  Abdominal: Soft. She exhibits no distension and no mass. There is no tenderness. There is no rebound and no guarding.  Musculoskeletal: Normal range of motion. She exhibits no edema.  Spine nontender, no crepitus, or stepoffs. Lower extremities:  Strength 5/5, sensation intact, distal pulses intact.     Pt sits straight up and lies back down frequently while discussing her back pain, lifts legs easily from the stretcher.  No joint erythema, warmth.    Neurological: She is alert.  Stands and ambulates without assistance.   Skin: She is not diaphoretic.  Single small bruise noted on right anterior thigh  Nursing note and vitals reviewed.   ED Course  Procedures (including critical care time) Labs Review Labs Reviewed  URINALYSIS, ROUTINE W REFLEX MICROSCOPIC (NOT AT [facility]) - Abnormal; Notable for the following:    APPearance CLOUDY (*)    All other components within normal limits  URINE CULTURE  CBC WITH DIFFERENTIAL/PLATELET  BASIC METABOLIC PANEL    Imaging Review Dg Lumbar Spine Complete  08/08/2015  CLINICAL DATA:  65 year old female with history of right lower lumbar pain radiating into the right leg for 1 week. EXAM: LUMBAR SPINE - COMPLETE 4+ VIEW COMPARISON:  No priors. FINDINGS: Severe dextroscoliosis of the lumbar spine convex to the right at the level  of L3. No definite acute displaced fractures in the lumbar spine. Multilevel degenerative disc disease, most severe at L3-L4. Multilevel facet arthropathy. No definite defects of the pars interarticularis are noted. Calcified fibroid in the anatomic pelvis. IMPRESSION: 1. 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 No acute findings. Electronically Signed   By: Vinnie Langton M.D.   On: 08/08/2015 07:54   I have personally reviewed and evaluated these images and lab results as part of my medical decision-making.   EKG Interpretation None      MDM   Final diagnoses:  Chronic back pain    Afebrile,  nontoxic patient with exacerbation of chronic back pain.  No red flags with history or exam.  Neurovascularly intact.  Xray shows DDD and scoliosis.  D/C home with refill of zanaflex (10 pills), motrin, close PCP follow up.   Discussed result, findings, treatment, and follow up  with patient.  Pt given return precautions.  Pt verbalizes understanding and agrees with plan.           Clayton Bibles, PA-C 08/08/15 [place], MD 08/08/15 1640

## 2015-08-08 NOTE — Discharge Instructions (Signed)
Read the information below.  Use the prescribed medication as directed.  Please discuss all new medications with your pharmacist.  You may return to the Emergency Department at any time for worsening condition or any new symptoms that concern you.     If you develop fevers, loss of control of bowel or bladder, weakness or numbness in your legs, or are unable to walk, return to the ER for a recheck.    Back Pain, Adult Back pain is very common. The pain often gets better over time. The cause of back pain is usually not dangerous. Most people can learn to manage their back pain on their own.  HOME CARE  Watch your back pain for any changes. The following actions may help to lessen any pain you are feeling:  Stay active. Start with short walks on flat ground if you can. Try to walk farther each day.  Exercise regularly as told by your doctor. Exercise helps your back heal faster. It also helps avoid future injury by keeping your muscles strong and flexible.  Do not sit, drive, or stand in one place for more than 30 minutes.  Do not stay in bed. Resting more than 1-2 days can slow down your recovery.  Be careful when you bend or lift an object. Use good form when lifting:  Bend at your knees.  Keep the object close to your body.  Do not twist.  Sleep on a firm mattress. Lie on your side, and bend your knees. If you lie on your back, put a pillow under your knees.  Take medicines only as told by your doctor.  Put ice on the injured area.  Put ice in a plastic bag.  Place a towel between your skin and the bag.  Leave the ice on for 20 minutes, 2-3 times a day for the first 2-3 days. After that, you can switch between ice and heat packs.  Avoid feeling anxious or stressed. Find good ways to deal with stress, such as exercise.  Maintain a healthy weight. Extra weight puts stress on your back. GET HELP IF:   You have pain that does not go away with rest or medicine.  You have  worsening pain that goes down into your legs or buttocks.  You have pain that does not get better in one week.  You have pain at night.  You lose weight.  You have a fever or chills. GET HELP RIGHT AWAY IF:   You cannot control when you poop (bowel movement) or pee (urinate).  Your arms or legs feel weak.  Your arms or legs lose feeling (numbness).  You feel sick to your stomach (nauseous) or throw up (vomit).  You have belly (abdominal) pain.  You feel like you may pass out (faint).   This information is not intended to replace advice given to you by your health care provider. Make sure you discuss any questions you have with your health care provider.   Document Released: 08/30/2007 Document Revised: 04/03/2014 Document Reviewed: 07/15/2013 Elsevier Interactive Patient Education 2016 Elsevier Inc[employer]  Pain Medicine Instructions HOW CAN PAIN MEDICINE AFFECT ME? You were prescribed pain medicine. This medicine may:  Make you tired or sleepy.  Affect how well you can:  Drive  Do certain activities. Pain medicine may not make all of your pain go away. You should be comfortable enough to:  Move.  Breathe.  Take care of yourself. HOW OFTEN SHOULD I TAKE PAIN MEDICINE AND HOW [facility]  SHOULD I TAKE?  Take pain medicine only as told by your doctor and only as needed for pain.  You do not need to take pain medicine if you are not having pain, unless your doctor tells you to do that.  You can take less than the prescribed dose if you find that less medicine helps your pain. WHAT SHOULD I AVOID WHILE I AM TAKING PAIN MEDICINE? Follow these instructions after you start taking pain medicine, while you are taking the medicine, and for 8 hours after you stop taking the medicine:  Do not drive.  Do not use machinery.  Do not use power tools.  Do not sign legal documents.  Do not drink alcohol.  Do not take sleeping pills.  Do not take care of children by  yourself.  Do not do any activities that involve climbing or being in high places.  Do not go into any body of water unless there is an adult nearby who can watch and help you. This includes:  [place].  Rivers.  Oceans.  Spas.  Swimming pools. HOW CAN I KEEP OTHERS SAFE WHILE I AM TAKING PAIN MEDICINE?  Store your pain medicine as told by your doctor. Make sure that you keep it where children and pets cannot reach it.  Do not share your pain medicine with anyone.  Do not save any leftover pills. If you have any leftover pain medicine, get rid of it or destroy it as told by your doctor. WHAT ELSE DO I NEED TO KNOW ABOUT TAKING PAIN MEDICINE?  Use a poop (stool) softener if you have trouble pooping (constipation) because of your pain medicine. Eating more fruits and vegetables also helps with constipation.  Write down the times when you take your pain medicine. Look at the times before you take your next dose of medicine.  If your pain is very bad, do not take more pills than told by your doctor. Call your doctor for help.  Your pain medicine might have acetaminophen in it. Do not take any other acetaminophen while you are taking this medicine. An overdose of acetaminophen can do very bad damage to your liver. If you are taking any medicines in addition to your pain medicine, check the active ingredients on those medicines to see if acetaminophen is listed. WHEN SHOULD I CALL MY DOCTOR?  Your medicine is not helping the pain.  You do either of these soon after you take the medicine:  Throw up (vomit).  Have watery poop (diarrhea).  You have new pain in areas that did not hurt before.  You have an allergic reaction to your medicine. This may include:  Feeling itchy.  Swelling.  Feeling dizzy.  Getting a new rash. WHEN SHOULD I CALL 911 OR GO TO THE EMERGENCY ROOM?  You feel dizzy or you faint.  You feel very confused.  You throw up again and again.  Your skin or  lips turn pale or bluish in color.  You are:  Short of breath.  Breathing much more slowly than usual.  You have a very bad allergic reaction to your medicine. This includes:  Developing a swollen tongue.  Having trouble breathing.   This information is not intended to replace advice given to you by your health care provider. Make sure you discuss any questions you have with your health care provider.   Document Released: 08/30/2007 Document Revised: 07/28/2014 Document Reviewed: 01/15/2014 Elsevier Interactive Patient Education [employer].

## 2015-08-08 NOTE — ED Notes (Signed)
The pt is c/o lower back lower abd and leg pain bi-laterally with bruises for 2 days

## 2015-08-09 LAB — URINE CULTURE

## 2015-08-10 NOTE — Telephone Encounter (Signed)
Patient called in regards to ensure. Please follow up.

## 2015-08-20 ENCOUNTER — Other Ambulatory Visit: Payer: Self-pay | Admitting: Family Medicine

## 2015-08-20 ENCOUNTER — Telehealth: Payer: Self-pay | Admitting: Family Medicine

## 2015-08-20 NOTE — Telephone Encounter (Signed)
Patient is requesting a prescription refill tylenol #3 and Zanaflex....  Please follow up with patient

## 2015-08-24 ENCOUNTER — Other Ambulatory Visit (HOSPITAL_COMMUNITY)
Admission: RE | Admit: 2015-08-24 | Discharge: 2015-08-24 | Disposition: A | Discharge status: Home / Self Care | Payer: PPO | Source: Ambulatory Visit | Attending: Family Medicine | Admitting: Family Medicine

## 2015-08-24 ENCOUNTER — Encounter: Payer: Self-pay | Admitting: Family Medicine

## 2015-08-24 ENCOUNTER — Ambulatory Visit: Payer: PPO | Attending: Family Medicine | Admitting: Family Medicine

## 2015-08-24 VITALS — BP 111/57 | HR 57 | Temp 98.1°F | Resp 16 | Ht 65.0 in | Wt 155.0 lb

## 2015-08-24 DIAGNOSIS — Z124 Encounter for screening for malignant neoplasm of cervix: Secondary | ICD-10-CM | POA: Diagnosis not present

## 2015-08-24 DIAGNOSIS — Z1151 Encounter for screening for human papillomavirus (HPV): Secondary | ICD-10-CM | POA: Insufficient documentation

## 2015-08-24 DIAGNOSIS — L80 Vitiligo: Secondary | ICD-10-CM | POA: Diagnosis not present

## 2015-08-24 DIAGNOSIS — Z01411 Encounter for gynecological examination (general) (routine) with abnormal findings: Secondary | ICD-10-CM | POA: Diagnosis not present

## 2015-08-24 DIAGNOSIS — Z01419 Encounter for gynecological examination (general) (routine) without abnormal findings: Secondary | ICD-10-CM | POA: Insufficient documentation

## 2015-08-24 MED ORDER — BETAMETHASONE DIPROPIONATE 0.05 % EX CREA: TOPICAL_CREAM | Freq: Two times a day (BID) | CUTANEOUS | Status: DC

## 2015-08-24 NOTE — Progress Notes (Signed)
Subjective:  Patient ID: [name], female    DOB: 10/31/1950  Age: 65 y.o. MRN: [id number]  CC: Gynecologic Exam   HPI [name] presents for    1. Pap: no pain with intercourse. Sexually active. No pain. No vaginal discharge.  2. Light spots on legs: enlarging. I referred her to derm in 02/2015. She did not go. Areas are slightly pruritic.    Social History  Substance Use Topics  . Smoking status: Former Smoker    Quit date: 07/29/2010  . Smokeless tobacco: Never Used  . Alcohol Use: No    Outpatient Prescriptions Prior to Visit  Medication Sig Dispense Refill  . amLODipine (NORVASC) 10 MG tablet Take 1 tablet (10 mg total) by mouth daily. 30 tablet 11  . aspirin EC 81 MG tablet Take 81 mg by mouth daily.    . Aspirin-Acetaminophen-Caffeine (EXCEDRIN PO) Take 1 tablet by mouth 2 (two) times daily as needed (pain).    . carvedilol (COREG) 6.25 MG tablet Take 1 tablet (6.25 mg total) by mouth 2 (two) times daily with a meal. 60 tablet 3  . ibuprofen (ADVIL,MOTRIN) 600 MG tablet Take 1 tablet (600 mg total) by mouth every 6 (six) hours as needed for mild pain or moderate pain. 10 tablet 0  . Multiple Vitamin (MULITIVITAMIN WITH MINERALS) TABS Take 1 tablet by mouth daily.    Marland Kitchen tiZANidine (ZANAFLEX) 4 MG tablet TAKE 1 TABLET BY MOUTH EVERY 6 HOURS AS NEEDED FOR MUSCLE SPASMS 30 tablet 0  . traMADol (ULTRAM) 50 MG tablet TAKE 1 TABLET BY MOUTH EVERY 12 HOURS AS NEEDED FOR MODERATE PAIN 60 tablet 2   No facility-administered medications prior to visit.    ROS Review of Systems  Constitutional: Negative for fever and chills.  Eyes: Negative for visual disturbance.  Respiratory: Negative for shortness of breath.   Cardiovascular: Negative for chest pain.  Gastrointestinal: Negative for abdominal pain and blood in stool.  Genitourinary: Negative for vaginal bleeding, vaginal discharge, vaginal pain and pelvic pain.  Musculoskeletal: Positive for arthralgias (R knee ).  Negative for back pain.  Skin: Positive for color change (anterior shins ). Negative for rash.  Allergic/Immunologic: Negative for immunocompromised state.  Hematological: Negative for adenopathy. Does not bruise/bleed easily.  Psychiatric/Behavioral: Negative for suicidal ideas and dysphoric mood.    Objective:  BP 111/57 mmHg  Pulse 57  Temp(Src) 98.1 F (36.7 C) (Oral)  Resp 16  Ht 5\' 5"  (1.651 m)  Wt 155 lb (70.308 kg)  BMI 25.79 kg/m2  SpO2 99%  BP/Weight 08/08/2015 06/26/2015 [id number]  Systolic BP [id number]  Diastolic BP 70 71 73  Wt. (Lbs) 155 157 158  BMI 25.79 26.13 26.29   Physical Exam  Constitutional: She appears well-developed and well-nourished. No distress.  Pulmonary/Chest: Effort normal.  Genitourinary: Vagina normal and uterus normal. Pelvic exam was performed with patient prone. There is no rash, tenderness or lesion on the right labia. There is no rash, tenderness or lesion on the left labia. Cervix exhibits no motion tenderness, no discharge and no friability.    Musculoskeletal: She exhibits no edema.  Lymphadenopathy:       Right: No inguinal adenopathy present.       Left: No inguinal adenopathy present.  Skin: Skin is warm and dry. No rash noted.        Assessment & Plan:   There are no diagnoses linked to this encounter. [name] was seen today for gynecologic exam.  Diagnoses and  all orders for this visit:  Pap smear for cervical cancer screening -     Cytology - PAP  Vitiligo -     betamethasone dipropionate (DIPROLENE) 0.05 % cream; Apply topically 2 (two) times daily.   Meds ordered this encounter  Medications  . betamethasone dipropionate (DIPROLENE) 0.05 % cream    Sig: Apply topically 2 (two) times daily.    Dispense:  30 g    Refill:  0    Follow-up: No Follow-up on file.   Boykin Nearing MD

## 2015-08-24 NOTE — Progress Notes (Signed)
Gyn physical  Sexually active, no pain with intercourse  No vaginal discharge  No pain today  No suicidal thoughts in the past two weeks

## 2015-08-24 NOTE — Telephone Encounter (Signed)
Pt was seen today.

## 2015-08-24 NOTE — Patient Instructions (Addendum)
[name] was seen today for gynecologic exam.  Diagnoses and all orders for this visit:  Pap smear for cervical cancer screening -     Cytology - PAP  Vitiligo -     betamethasone dipropionate (DIPROLENE) 0.05 % cream; Apply topically 2 (two) times daily.  apply diprolene to legs  stop diprolene you notice skin thinning or pain at site of application   F/u in 3 months for HTN   Dr. Adrian Blackwater

## 2015-08-25 LAB — CERVICOVAGINAL ANCILLARY ONLY
Chlamydia: NEGATIVE
NEISSERIA GONORRHEA: NEGATIVE
WET PREP (BD AFFIRM): NEGATIVE

## 2015-08-25 LAB — CYTOLOGY - PAP

## 2015-08-30 ENCOUNTER — Telehealth: Payer: Self-pay | Admitting: Family Medicine

## 2015-08-30 ENCOUNTER — Telehealth: Payer: Self-pay | Admitting: *Deleted

## 2015-08-30 DIAGNOSIS — L819 Disorder of pigmentation, unspecified: Secondary | ICD-10-CM

## 2015-08-30 NOTE — Telephone Encounter (Signed)
LVM to return call.

## 2015-08-30 NOTE — Telephone Encounter (Signed)
-----   Message from Boykin Nearing, MD sent at 08/26/2015  9:05 AM EDT ----- Pap and screening HPV negative Screening wet prep and GC/chlam negative

## 2015-08-30 NOTE — Telephone Encounter (Signed)
Pt. Returned call. Pt. Stated that she has been calling since last week. She will be coming to speak with the nurse. Please f/u

## 2015-08-30 NOTE — Telephone Encounter (Signed)
Patient called requesting medication change for ointment that was prescribed, patient states insurance will not cover it. Pt is also requesting pap smear results.

## 2015-08-31 NOTE — Telephone Encounter (Signed)
What type of insurance does patient have? Does she have prescription drug coverage?

## 2015-09-01 NOTE — Telephone Encounter (Signed)
LVM to return call.

## 2015-09-02 NOTE — Telephone Encounter (Signed)
Patient called stated that these medications are covered under her insurance,  Aug-Betcfet 0.5 Ammoniumlac 12% Aklometa 0.5

## 2015-09-06 MED ORDER — BETAMETHASONE DIPROPIONATE AUG 0.05 % EX CREA: TOPICAL_CREAM | Freq: Two times a day (BID) | CUTANEOUS | Status: DC

## 2015-09-06 NOTE — Telephone Encounter (Signed)
Patient called stated that these medications are covered under her insurance,  Aug-Betcfet 0.5 Ammoniumlac 12% Aklometa 0.5

## 2015-09-06 NOTE — Telephone Encounter (Signed)
I think the first medication is Aug-Betamet and this is betamethasone augmented. I confirmed with the formulary list for [employer] so this should be covered and is not really different from the original bethamethasone ordered. I have sent this to her pharmacy.

## 2015-09-06 NOTE — Telephone Encounter (Signed)
Patient called requesting medication change status, please f/up

## 2015-09-12 ENCOUNTER — Encounter (HOSPITAL_COMMUNITY): Payer: Self-pay | Admitting: Emergency Medicine

## 2015-09-12 ENCOUNTER — Telehealth (HOSPITAL_COMMUNITY): Payer: Self-pay

## 2015-09-12 ENCOUNTER — Emergency Department (HOSPITAL_COMMUNITY)
Admission: EM | Admit: 2015-09-12 | Discharge: 2015-09-12 | Disposition: A | Discharge status: Home / Self Care | Payer: PPO | Attending: Emergency Medicine | Admitting: Emergency Medicine

## 2015-09-12 ENCOUNTER — Emergency Department (HOSPITAL_COMMUNITY): Payer: PPO

## 2015-09-12 DIAGNOSIS — S61253A Open bite of left middle finger without damage to nail, initial encounter: Secondary | ICD-10-CM | POA: Diagnosis not present

## 2015-09-12 DIAGNOSIS — S61353A Open bite of left middle finger with damage to nail, initial encounter: Secondary | ICD-10-CM | POA: Diagnosis not present

## 2015-09-12 DIAGNOSIS — Y999 Unspecified external cause status: Secondary | ICD-10-CM | POA: Diagnosis not present

## 2015-09-12 DIAGNOSIS — E785 Hyperlipidemia, unspecified: Secondary | ICD-10-CM | POA: Insufficient documentation

## 2015-09-12 DIAGNOSIS — S60132A Contusion of left middle finger with damage to nail, initial encounter: Secondary | ICD-10-CM | POA: Insufficient documentation

## 2015-09-12 DIAGNOSIS — Z87891 Personal history of nicotine dependence: Secondary | ICD-10-CM | POA: Insufficient documentation

## 2015-09-12 DIAGNOSIS — Y939 Activity, unspecified: Secondary | ICD-10-CM | POA: Insufficient documentation

## 2015-09-12 DIAGNOSIS — I10 Essential (primary) hypertension: Secondary | ICD-10-CM | POA: Diagnosis not present

## 2015-09-12 DIAGNOSIS — S60032A Contusion of left middle finger without damage to nail, initial encounter: Secondary | ICD-10-CM | POA: Diagnosis not present

## 2015-09-12 DIAGNOSIS — W503XXA Accidental bite by another person, initial encounter: Secondary | ICD-10-CM

## 2015-09-12 DIAGNOSIS — Z79899 Other long term (current) drug therapy: Secondary | ICD-10-CM | POA: Insufficient documentation

## 2015-09-12 DIAGNOSIS — Y929 Unspecified place or not applicable: Secondary | ICD-10-CM | POA: Insufficient documentation

## 2015-09-12 DIAGNOSIS — S6992XA Unspecified injury of left wrist, hand and finger(s), initial encounter: Secondary | ICD-10-CM | POA: Diagnosis not present

## 2015-09-12 DIAGNOSIS — S6010XA Contusion of unspecified finger with damage to nail, initial encounter: Secondary | ICD-10-CM

## 2015-09-12 DIAGNOSIS — Z7982 Long term (current) use of aspirin: Secondary | ICD-10-CM | POA: Diagnosis not present

## 2015-09-12 MED ORDER — DOXYCYCLINE HYCLATE 100 MG PO TABS
100.0000 mg | ORAL_TABLET | Freq: Once | ORAL | Status: AC
  Administered 2015-09-12: 100 mg via ORAL
  Filled 2015-09-12: qty 1

## 2015-09-12 MED ORDER — TETANUS-DIPHTH-ACELL PERTUSSIS 5-2.5-18.5 LF-MCG/0.5 IM SUSP
0.5000 mL | Freq: Once | INTRAMUSCULAR | Status: AC
  Administered 2015-09-12: 0.5 mL via INTRAMUSCULAR
  Filled 2015-09-12: qty 0.5

## 2015-09-12 MED ORDER — OXYCODONE-ACETAMINOPHEN 5-325 MG PO TABS
ORAL_TABLET | ORAL | Status: AC
  Filled 2015-09-12: qty 1

## 2015-09-12 MED ORDER — OXYCODONE-ACETAMINOPHEN 5-325 MG PO TABS: 1.0000 | ORAL_TABLET | Freq: Four times a day (QID) | ORAL | Status: DC | PRN

## 2015-09-12 MED ORDER — DOXYCYCLINE HYCLATE 100 MG PO TABS: 100.0000 mg | ORAL_TABLET | Freq: Two times a day (BID) | ORAL | Status: DC

## 2015-09-12 MED ORDER — OXYCODONE-ACETAMINOPHEN 5-325 MG PO TABS
1.0000 | ORAL_TABLET | ORAL | Status: DC | PRN
  Administered 2015-09-12: 1 via ORAL

## 2015-09-12 NOTE — Discharge Instructions (Signed)
You have been given antibiotics to treat your human bite.  Please take this as directed until all tablets have been completed.  Watch the area for any signs on the infection .  The hematoma under your nail is draining.  Due to the slight laceration next to it .  This will be uncomfortable, and discolored for a while .  As the nail grows out.  She will see decrease in the discoloration  If you do not have significant decrease in pain or you notice signs of infection, increased swelling.  Please make an appointment with the hand surgeon for further evaluation   Subungual Hematoma A subungual hematoma is a pocket of blood that collects under the fingernail or toenail. The pressure created by the blood under the nail can cause pain. CAUSES  A subungual hematoma occurs when an injury to the finger or toe causes a blood vessel beneath the nail to break. The injury can occur from a direct blow such as slamming a finger in a door. It can also occur from a repeated injury such as pressure on the foot in a shoe while running. A subungual hematoma is sometimes called runner's toe or tennis toe. SYMPTOMS   Blue or dark blue skin under the nail.  Pain or throbbing in the injured area. DIAGNOSIS  Your caregiver can determine whether you have a subungual hematoma based on your history and a physical exam. If your caregiver thinks you might have a broken (fractured) bone, X-rays may be taken. TREATMENT  Hematomas usually go away on their own over time. Your caregiver may make a hole in the nail to drain the blood. Draining the blood is painless and usually provides significant relief from pain and throbbing. The nail usually grows back normally after this procedure. In some cases, the nail may need to be removed. This is done if there is a cut under the nail that requires stitches (sutures). HOME CARE INSTRUCTIONS   Put ice on the injured area.  Put ice in a plastic bag.  Place a towel between your skin and  the bag.  Leave the ice on for 15-20 minutes, 03-04 times a day for the first 1 to 2 days.  Elevate the injured area to help decrease pain and swelling.  If you were given a bandage, wear it for as long as directed by your caregiver.  If part of your nail falls off, trim the remaining nail gently. This prevents the nail from catching on something and causing further injury.  Only take over-the-counter or prescription medicines for pain, discomfort, or fever as directed by your caregiver. SEEK IMMEDIATE MEDICAL CARE IF:   You have redness or swelling around the nail.  You have yellowish-white fluid (pus) coming from the nail.  Your pain is not controlled with medicine.  You have a fever. MAKE SURE YOU:  Understand these instructions.  Will watch your condition.  Will get help right away if you are not doing well or get worse.   This information is not intended to replace advice given to you by your health care provider. Make sure you discuss any questions you have with your health care provider.   Document Released: 03/10/2000 Document Revised: 06/05/2011 Document Reviewed: 07/29/2014 Elsevier Interactive Patient Education [employer].

## 2015-09-12 NOTE — Telephone Encounter (Signed)
Pharmacy calling for clarification of doxycycline Rx.  Call transferred to prescriber Marquita Palms NP for clarification.
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
## 2015-09-30 ENCOUNTER — Emergency Department (HOSPITAL_COMMUNITY): Payer: PPO

## 2015-09-30 ENCOUNTER — Telehealth: Payer: Self-pay | Admitting: Family Medicine

## 2015-09-30 ENCOUNTER — Inpatient Hospital Stay (HOSPITAL_COMMUNITY)
Admission: EM | Admit: 2015-09-30 | Discharge: 2015-10-04 | DRG: 698 | Disposition: A | Discharge status: Home / Self Care | Payer: PPO | Attending: Internal Medicine | Admitting: Internal Medicine

## 2015-09-30 ENCOUNTER — Encounter (HOSPITAL_COMMUNITY): Payer: Self-pay | Admitting: Emergency Medicine

## 2015-09-30 DIAGNOSIS — R350 Frequency of micturition: Secondary | ICD-10-CM

## 2015-09-30 DIAGNOSIS — Z7982 Long term (current) use of aspirin: Secondary | ICD-10-CM

## 2015-09-30 DIAGNOSIS — I700 Atherosclerosis of aorta: Secondary | ICD-10-CM | POA: Diagnosis not present

## 2015-09-30 DIAGNOSIS — R197 Diarrhea, unspecified: Secondary | ICD-10-CM | POA: Diagnosis not present

## 2015-09-30 DIAGNOSIS — I10 Essential (primary) hypertension: Secondary | ICD-10-CM | POA: Diagnosis not present

## 2015-09-30 DIAGNOSIS — Z91040 Latex allergy status: Secondary | ICD-10-CM

## 2015-09-30 DIAGNOSIS — S61253D Open bite of left middle finger without damage to nail, subsequent encounter: Secondary | ICD-10-CM

## 2015-09-30 DIAGNOSIS — I330 Acute and subacute infective endocarditis: Secondary | ICD-10-CM | POA: Diagnosis not present

## 2015-09-30 DIAGNOSIS — R93429 Abnormal radiologic findings on diagnostic imaging of unspecified kidney: Secondary | ICD-10-CM

## 2015-09-30 DIAGNOSIS — Z91013 Allergy to seafood: Secondary | ICD-10-CM | POA: Diagnosis not present

## 2015-09-30 DIAGNOSIS — I471 Supraventricular tachycardia: Secondary | ICD-10-CM | POA: Diagnosis not present

## 2015-09-30 DIAGNOSIS — Z880 Allergy status to penicillin: Secondary | ICD-10-CM | POA: Diagnosis not present

## 2015-09-30 DIAGNOSIS — Z79899 Other long term (current) drug therapy: Secondary | ICD-10-CM | POA: Diagnosis not present

## 2015-09-30 DIAGNOSIS — R358 Other polyuria: Secondary | ICD-10-CM | POA: Diagnosis not present

## 2015-09-30 DIAGNOSIS — R109 Unspecified abdominal pain: Secondary | ICD-10-CM | POA: Diagnosis not present

## 2015-09-30 DIAGNOSIS — R1084 Generalized abdominal pain: Secondary | ICD-10-CM | POA: Diagnosis not present

## 2015-09-30 DIAGNOSIS — E876 Hypokalemia: Secondary | ICD-10-CM | POA: Diagnosis present

## 2015-09-30 DIAGNOSIS — I38 Endocarditis, valve unspecified: Secondary | ICD-10-CM | POA: Diagnosis not present

## 2015-09-30 DIAGNOSIS — R011 Cardiac murmur, unspecified: Secondary | ICD-10-CM | POA: Diagnosis not present

## 2015-09-30 DIAGNOSIS — R110 Nausea: Secondary | ICD-10-CM | POA: Diagnosis not present

## 2015-09-30 DIAGNOSIS — N280 Ischemia and infarction of kidney: Principal | ICD-10-CM | POA: Diagnosis present

## 2015-09-30 DIAGNOSIS — E785 Hyperlipidemia, unspecified: Secondary | ICD-10-CM | POA: Diagnosis not present

## 2015-09-30 DIAGNOSIS — Z87891 Personal history of nicotine dependence: Secondary | ICD-10-CM

## 2015-09-30 DIAGNOSIS — R10A0 Flank pain, unspecified side: Secondary | ICD-10-CM | POA: Diagnosis present

## 2015-09-30 DIAGNOSIS — I359 Nonrheumatic aortic valve disorder, unspecified: Secondary | ICD-10-CM | POA: Diagnosis not present

## 2015-09-30 HISTORY — DX: Acute and subacute infective endocarditis: I33.0

## 2015-09-30 LAB — URINALYSIS, ROUTINE W REFLEX MICROSCOPIC
Bilirubin Urine: NEGATIVE
Glucose, UA: NEGATIVE mg/dL
Hgb urine dipstick: NEGATIVE
Ketones, ur: NEGATIVE mg/dL
Leukocytes, UA: NEGATIVE
Nitrite: NEGATIVE
Protein, ur: NEGATIVE mg/dL
Specific Gravity, Urine: 1.013 (ref 1.005–1.030)
pH: 6.5 (ref 5.0–8.0)

## 2015-09-30 LAB — COMPREHENSIVE METABOLIC PANEL
ALT: 17 U/L (ref 14–54)
AST: 23 U/L (ref 15–41)
Albumin: 3.8 g/dL (ref 3.5–5.0)
Alkaline Phosphatase: 102 U/L (ref 38–126)
Anion gap: 8 (ref 5–15)
BILIRUBIN TOTAL: 0.6 mg/dL (ref 0.3–1.2)
BUN: 8 mg/dL (ref 6–20)
CHLORIDE: 106 mmol/L (ref 101–111)
CO2: 25 mmol/L (ref 22–32)
CREATININE: 0.72 mg/dL (ref 0.44–1.00)
Calcium: 9.3 mg/dL (ref 8.9–10.3)
Glucose, Bld: 118 mg/dL — ABNORMAL HIGH (ref 65–99)
Potassium: 3.3 mmol/L — ABNORMAL LOW (ref 3.5–5.1)
Sodium: 139 mmol/L (ref 135–145)
TOTAL PROTEIN: 7.3 g/dL (ref 6.5–8.1)

## 2015-09-30 LAB — CBC
HCT: 40.4 % (ref 36.0–46.0)
Hemoglobin: 13 g/dL (ref 12.0–15.0)
MCH: 31 pg (ref 26.0–34.0)
MCHC: 32.2 g/dL (ref 30.0–36.0)
MCV: 96.2 fL (ref 78.0–100.0)
PLATELETS: 301 10*3/uL (ref 150–400)
RBC: 4.2 MIL/uL (ref 3.87–5.11)
RDW: 13.5 % (ref 11.5–15.5)
WBC: 13.1 10*3/uL — AB (ref 4.0–10.5)

## 2015-09-30 LAB — LIPASE, BLOOD: LIPASE: 23 U/L (ref 11–51)

## 2015-09-30 LAB — LACTATE DEHYDROGENASE: LDH: 160 U/L (ref 98–192)

## 2015-09-30 MED ORDER — MORPHINE SULFATE (PF) 4 MG/ML IV SOLN
4.0000 mg | Freq: Once | INTRAVENOUS | Status: AC
  Administered 2015-09-30: 4 mg via INTRAVENOUS
  Filled 2015-09-30: qty 1

## 2015-09-30 MED ORDER — CARVEDILOL 6.25 MG PO TABS
6.2500 mg | ORAL_TABLET | Freq: Two times a day (BID) | ORAL | Status: DC
  Administered 2015-09-30 – 2015-10-04 (×9): 6.25 mg via ORAL
  Filled 2015-09-30 (×9): qty 1

## 2015-09-30 MED ORDER — ENOXAPARIN SODIUM 40 MG/0.4ML ~~LOC~~ SOLN
40.0000 mg | SUBCUTANEOUS | Status: DC
  Filled 2015-09-30: qty 0.4

## 2015-09-30 MED ORDER — HEPARIN BOLUS VIA INFUSION
4000.0000 [IU] | Freq: Once | INTRAVENOUS | Status: AC
  Administered 2015-10-01: 4000 [IU] via INTRAVENOUS
  Filled 2015-09-30: qty 4000

## 2015-09-30 MED ORDER — FLUTICASONE PROPIONATE 50 MCG/ACT NA SUSP
1.0000 | Freq: Every day | NASAL | Status: DC
  Administered 2015-10-04: 1 via NASAL
  Filled 2015-09-30: qty 16

## 2015-09-30 MED ORDER — GADOBENATE DIMEGLUMINE 529 MG/ML IV SOLN
15.0000 mL | Freq: Once | INTRAVENOUS | Status: AC | PRN
  Administered 2015-09-30: 15 mL via INTRAVENOUS

## 2015-09-30 MED ORDER — LORAZEPAM 2 MG/ML IJ SOLN
1.0000 mg | Freq: Once | INTRAMUSCULAR | Status: DC
  Filled 2015-09-30: qty 1

## 2015-09-30 MED ORDER — ONDANSETRON HCL 4 MG/2ML IJ SOLN
4.0000 mg | Freq: Once | INTRAMUSCULAR | Status: AC
  Administered 2015-09-30: 4 mg via INTRAVENOUS
  Filled 2015-09-30: qty 2

## 2015-09-30 MED ORDER — TIZANIDINE HCL 2 MG PO TABS: 2.0000 mg | ORAL_TABLET | Freq: Four times a day (QID) | ORAL | Status: DC | PRN

## 2015-09-30 MED ORDER — POTASSIUM CHLORIDE CRYS ER 20 MEQ PO TBCR
40.0000 meq | EXTENDED_RELEASE_TABLET | Freq: Once | ORAL | Status: AC
  Administered 2015-09-30: 40 meq via ORAL
  Filled 2015-09-30: qty 2

## 2015-09-30 MED ORDER — SODIUM CHLORIDE 0.9% FLUSH
3.0000 mL | Freq: Two times a day (BID) | INTRAVENOUS | Status: DC
  Administered 2015-10-01 – 2015-10-04 (×2): 3 mL via INTRAVENOUS

## 2015-09-30 MED ORDER — ASPIRIN EC 81 MG PO TBEC
81.0000 mg | DELAYED_RELEASE_TABLET | Freq: Every day | ORAL | Status: DC
  Administered 2015-09-30 – 2015-10-04 (×5): 81 mg via ORAL
  Filled 2015-09-30 (×5): qty 1

## 2015-09-30 MED ORDER — ACETAMINOPHEN 325 MG PO TABS
650.0000 mg | ORAL_TABLET | Freq: Four times a day (QID) | ORAL | Status: DC | PRN
  Administered 2015-10-02: 650 mg via ORAL
  Filled 2015-09-30: qty 2

## 2015-09-30 MED ORDER — ONDANSETRON HCL 4 MG PO TABS: 4.0000 mg | ORAL_TABLET | Freq: Four times a day (QID) | ORAL | Status: DC | PRN

## 2015-09-30 MED ORDER — AMLODIPINE BESYLATE 10 MG PO TABS
10.0000 mg | ORAL_TABLET | Freq: Every day | ORAL | Status: DC
  Administered 2015-09-30 – 2015-10-04 (×5): 10 mg via ORAL
  Filled 2015-09-30 (×5): qty 1

## 2015-09-30 MED ORDER — ACETAMINOPHEN 650 MG RE SUPP: 650.0000 mg | Freq: Four times a day (QID) | RECTAL | Status: DC | PRN

## 2015-09-30 MED ORDER — TRAMADOL HCL 50 MG PO TABS
50.0000 mg | ORAL_TABLET | Freq: Two times a day (BID) | ORAL | Status: DC | PRN
  Administered 2015-09-30 – 2015-10-01 (×2): 50 mg via ORAL
  Filled 2015-09-30 (×2): qty 1

## 2015-09-30 MED ORDER — HEPARIN (PORCINE) IN NACL 100-0.45 UNIT/ML-% IJ SOLN
800.0000 [IU]/h | INTRAMUSCULAR | Status: DC
  Administered 2015-10-01: 1000 [IU]/h via INTRAVENOUS
  Administered 2015-10-02 – 2015-10-03 (×2): 800 [IU]/h via INTRAVENOUS
  Filled 2015-09-30 (×5): qty 250

## 2015-09-30 MED ORDER — IOPAMIDOL (ISOVUE-300) INJECTION 61%
INTRAVENOUS | Status: AC
Start: 2015-09-30 — End: 2015-09-30
  Administered 2015-09-30: 100 mL
  Filled 2015-09-30: qty 100
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
## 2015-09-30 NOTE — Progress Notes (Signed)
Arrived to 6n6 from ED. Ambulated to bed from hallway, gait steady, oriented to room and surroundings, family at bedside. Doctors in to see pt at this time

## 2015-09-30 NOTE — Telephone Encounter (Signed)
Pt. Called stating she went to the ED last night and she is still there. Pt. Is waiting for her x-ray results And would like for her PCP to go to the hospital so that she can see the results. Pt. Was told that her PCP  Would be with pt. All day long and she stated to let her PCP know.Pt. Also stated she does not have her  Results for her pap smear. Please d/u with pt.
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
## 2015-09-30 NOTE — ED Provider Notes (Signed)
MRI of the abdomen reveals areas of hypoperfusion in the upper pole of the left kidney. This was discussed with the nephrologist and the radiologist. Concern for embolic source discussed. Admit to internal medicine teaching service for further workup.  Nat Christen, MD 09/30/15 1431

## 2015-09-30 NOTE — Telephone Encounter (Signed)
Called pt. Reached voicemail. Left message to return call at [phone]. 

## 2015-09-30 NOTE — ED Notes (Signed)
EDP at bedside  

## 2015-09-30 NOTE — Progress Notes (Signed)
ANTICOAGULATION CONSULT NOTE - Initial Consult  Pharmacy Consult for Heparin Indication: renal infarct  Patient Measurements: Weight: 158 lb 8.2 oz (71.9 kg)  Vital Signs: Temp: 99 F (37.2 C) (07/06 1838) Temp Source: Oral (07/06 1838) BP: 164/79 mmHg (07/06 1838) Pulse Rate: 64 (07/06 1838)  Labs:  Recent Labs  09/30/15 0026  HGB 13.0  HCT 40.4  PLT 301  CREATININE 0.72    Estimated Creatinine Clearance: 68.8 mL/min (by C-G formula based on Cr of 0.72).   Medical History: Past Medical History  Diagnosis Date  . Meniscus tear     L KNEE  . Arthritis 2000  . Hypertension 2010  . Abnormal EKG 2012    hospitalized for T wave inversion in lateral leads with [facility] chest pains, normal ECHO, negative trops, no cardiology consult. recent EKG 7/15 stable T wave inversions.   . Heart murmur   . Hyperlipidemia     Assessment: 64 year old female beginning heparin for renal infarct  Goal of Therapy:  Heparin level 0.3-0.7 units/ml Monitor platelets by anticoagulation protocol: Yes   Plan:  Heparin 4000 units iv bolus x 1 Heparin drip at 1000 units / hr Daily heparin level, CBC, first at 7 am  Thank you Anette Guarneri, PharmD [phone]  09/30/2015,9:48 PM

## 2015-09-30 NOTE — ED Notes (Signed)
Attempted report 

## 2015-10-01 ENCOUNTER — Observation Stay (HOSPITAL_BASED_OUTPATIENT_CLINIC_OR_DEPARTMENT_OTHER): Payer: PPO

## 2015-10-01 DIAGNOSIS — Z880 Allergy status to penicillin: Secondary | ICD-10-CM | POA: Diagnosis not present

## 2015-10-01 DIAGNOSIS — Z79899 Other long term (current) drug therapy: Secondary | ICD-10-CM | POA: Diagnosis not present

## 2015-10-01 DIAGNOSIS — E785 Hyperlipidemia, unspecified: Secondary | ICD-10-CM | POA: Diagnosis not present

## 2015-10-01 DIAGNOSIS — I10 Essential (primary) hypertension: Secondary | ICD-10-CM | POA: Diagnosis not present

## 2015-10-01 DIAGNOSIS — I700 Atherosclerosis of aorta: Secondary | ICD-10-CM | POA: Diagnosis not present

## 2015-10-01 DIAGNOSIS — N280 Ischemia and infarction of kidney: Secondary | ICD-10-CM | POA: Diagnosis not present

## 2015-10-01 DIAGNOSIS — R197 Diarrhea, unspecified: Secondary | ICD-10-CM | POA: Diagnosis not present

## 2015-10-01 DIAGNOSIS — R350 Frequency of micturition: Secondary | ICD-10-CM | POA: Diagnosis not present

## 2015-10-01 DIAGNOSIS — S61253D Open bite of left middle finger without damage to nail, subsequent encounter: Secondary | ICD-10-CM | POA: Diagnosis not present

## 2015-10-01 DIAGNOSIS — R93429 Abnormal radiologic findings on diagnostic imaging of unspecified kidney: Secondary | ICD-10-CM | POA: Diagnosis not present

## 2015-10-01 DIAGNOSIS — R358 Other polyuria: Secondary | ICD-10-CM

## 2015-10-01 DIAGNOSIS — Z91040 Latex allergy status: Secondary | ICD-10-CM | POA: Diagnosis not present

## 2015-10-01 DIAGNOSIS — Z91013 Allergy to seafood: Secondary | ICD-10-CM | POA: Diagnosis not present

## 2015-10-01 DIAGNOSIS — R011 Cardiac murmur, unspecified: Secondary | ICD-10-CM | POA: Diagnosis not present

## 2015-10-01 DIAGNOSIS — I471 Supraventricular tachycardia: Secondary | ICD-10-CM | POA: Diagnosis not present

## 2015-10-01 DIAGNOSIS — Z7982 Long term (current) use of aspirin: Secondary | ICD-10-CM | POA: Diagnosis not present

## 2015-10-01 DIAGNOSIS — I38 Endocarditis, valve unspecified: Secondary | ICD-10-CM | POA: Diagnosis not present

## 2015-10-01 DIAGNOSIS — R109 Unspecified abdominal pain: Secondary | ICD-10-CM | POA: Diagnosis not present

## 2015-10-01 DIAGNOSIS — E876 Hypokalemia: Secondary | ICD-10-CM | POA: Diagnosis not present

## 2015-10-01 DIAGNOSIS — I359 Nonrheumatic aortic valve disorder, unspecified: Secondary | ICD-10-CM | POA: Diagnosis not present

## 2015-10-01 DIAGNOSIS — Z87891 Personal history of nicotine dependence: Secondary | ICD-10-CM | POA: Diagnosis not present

## 2015-10-01 DIAGNOSIS — R1084 Generalized abdominal pain: Secondary | ICD-10-CM | POA: Diagnosis not present

## 2015-10-01 DIAGNOSIS — I330 Acute and subacute infective endocarditis: Secondary | ICD-10-CM | POA: Diagnosis not present

## 2015-10-01 DIAGNOSIS — R110 Nausea: Secondary | ICD-10-CM | POA: Diagnosis not present

## 2015-10-01 LAB — BASIC METABOLIC PANEL
Anion gap: 5 (ref 5–15)
BUN: 11 mg/dL (ref 6–20)
CO2: 27 mmol/L (ref 22–32)
CREATININE: 0.69 mg/dL (ref 0.44–1.00)
Calcium: 8.9 mg/dL (ref 8.9–10.3)
Chloride: 107 mmol/L (ref 101–111)
GFR calc non Af Amer: >60 mL/min (ref >60)
Glucose, Bld: 91 mg/dL (ref 65–99)
POTASSIUM: 3.8 mmol/L (ref 3.5–5.1)
SODIUM: 139 mmol/L (ref 135–145)

## 2015-10-01 LAB — LIPID PANEL
CHOLESTEROL: 245 mg/dL — AB (ref 0–200)
HDL: 52 mg/dL (ref >40)
LDL Cholesterol: 166 mg/dL — ABNORMAL HIGH (ref 0–99)
Total CHOL/HDL Ratio: 4.7 RATIO
Triglycerides: 133 mg/dL (ref <150)
VLDL: 27 mg/dL (ref 0–40)

## 2015-10-01 LAB — CBC
HCT: 39.8 % (ref 36.0–46.0)
Hemoglobin: 12.9 g/dL (ref 12.0–15.0)
MCH: 31 pg (ref 26.0–34.0)
MCHC: 32.4 g/dL (ref 30.0–36.0)
MCV: 95.7 fL (ref 78.0–100.0)
PLATELETS: 280 10*3/uL (ref 150–400)
RBC: 4.16 MIL/uL (ref 3.87–5.11)
RDW: 13.6 % (ref 11.5–15.5)
WBC: 9.5 10*3/uL (ref 4.0–10.5)

## 2015-10-01 LAB — ECHOCARDIOGRAM COMPLETE: Weight: 2536.17 oz

## 2015-10-01 LAB — HEPARIN LEVEL (UNFRACTIONATED)
Heparin Unfractionated: 0.97 IU/mL — ABNORMAL HIGH (ref 0.30–0.70)
Heparin Unfractionated: 0.56 IU/mL (ref 0.30–0.70)

## 2015-10-01 MED ORDER — ATORVASTATIN CALCIUM 40 MG PO TABS
40.0000 mg | ORAL_TABLET | Freq: Every day | ORAL | Status: DC
Start: 2015-10-01 — End: 2015-10-04
  Administered 2015-10-01 – 2015-10-04 (×4): 40 mg via ORAL
  Filled 2015-10-01 (×4): qty 1

## 2015-10-01 NOTE — Progress Notes (Signed)
ANTICOAGULATION CONSULT NOTE - Follow Up Consult  Pharmacy Consult for Heparin Indication: Renal infarct  Patient Measurements: Weight: 158 lb 8.2 oz (71.9 kg)  IBW: 57 kg Heparin Dosing Weight: 71 kg  Vital Signs: Temp: 98.2 F (36.8 C) (07/07 0616) Temp Source: Oral (07/07 0616) BP: 160/78 mmHg (07/07 0616) Pulse Rate: 64 (07/07 0616)  Labs:  Recent Labs  09/30/15 0026 10/01/15 0505 10/01/15 0714  HGB 13.0 12.9  --   HCT 40.4 39.8  --   PLT 301 280  --   HEPARINUNFRC  --   --  0.97*  CREATININE 0.72 0.69  --     Estimated Creatinine Clearance: 68.8 mL/min (by C-G formula based on Cr of 0.69).   Medications:  Heparin @ 1000 units/hr (10 ml/hr)  Assessment: 46 YOF who presented to the [facility] on 7/6 with complaints of lower abdominal pain with work-up via CT revealing possible renal infarcts. Pharmacy was consulted to start heparin for anticoagulation.   Heparin level this morning is SUPRAtherapeutic (HL 0.97, goal of 0.3-0.7). CBC stable - no overt s/sx of bleeding noted per discussion with the RN. Will reduce the drip rate and recheck a HL in 6 hours.   Goal of Therapy:  Heparin level 0.3-0.7 units/ml Monitor platelets by anticoagulation protocol: Yes   Plan:  1. Reduce Heparin to 800 units/hr (8 ml/hr) 2. Will continue to monitor for any signs/symptoms of bleeding and will follow up with heparin level in 6 hours   Alycia Rossetti, PharmD, BCPS Clinical Pharmacist Pager: [phone] 10/01/2015 8:57 AM

## 2015-10-01 NOTE — Progress Notes (Signed)
ANTICOAGULATION CONSULT NOTE - Initial Consult  Pharmacy Consult for Heparin Indication: renal infarcts  Patient Measurements: Weight: 158 lb 8.2 oz (71.9 kg)  Vital Signs: Temp: 98.2 F (36.8 C) (07/07 0616) Temp Source: Oral (07/07 0616) BP: 160/78 mmHg (07/07 0616) Pulse Rate: 64 (07/07 0616)  Labs:  Recent Labs  09/30/15 0026 10/01/15 0505 10/01/15 0714 10/01/15 1519  HGB 13.0 12.9  --   --   HCT 40.4 39.8  --   --   PLT 301 280  --   --   HEPARINUNFRC  --   --  0.97* 0.56  CREATININE 0.72 0.69  --   --     Estimated Creatinine Clearance: 68.8 mL/min (by C-G formula based on Cr of 0.69).   Medical History: Past Medical History  Diagnosis Date  . Meniscus tear     L KNEE  . Arthritis 2000  . Hypertension 2010  . Abnormal EKG 2012    hospitalized for T wave inversion in lateral leads with MSK chest pains, normal ECHO, negative trops, no cardiology consult. recent EKG 7/15 stable T wave inversions.   . Heart murmur   . Hyperlipidemia     Assessment: 65 year old female continues on heparin for renal infarcts Heparin level now therapeutic  Goal of Therapy:  Heparin level 0.3-0.7 units/ml Monitor platelets by anticoagulation protocol: Yes   Plan:  Continue heparin at 800 units / hr Daily heparin level, CBC  Thank you Anette Guarneri, PharmD [phone]  10/01/2015,4:00 PM

## 2015-10-01 NOTE — Progress Notes (Signed)
   Subjective: Doing well and feels better overall. Abdominal pain has resolved. BM today formed. No palpitations.  Objective: Vital signs in last 24 hours: Filed Vitals:   09/30/15 1530 09/30/15 1838 09/30/15 2221 10/01/15 0616  BP: 141/62 164/79 141/63 160/78  Pulse: 60 64 67 64  Temp:  99 F (37.2 C) 97.7 F (36.5 C) 98.2 F (36.8 C)  TempSrc:  Oral Oral Oral  Resp: 18 18 17 18   Weight:  158 lb 8.2 oz (71.9 kg)    SpO2: 95% 100% 100% 100%   Physical Exam General Apperance: NAD HEENT: Normocephalic, atraumatic, anicteric sclera Neck: Supple, trachea midline Lungs: Clear to auscultation bilaterally. No wheezes, rhonchi or rales. Breathing comfortably Heart: Regular rate and rhythm, no murmur/rub/gallop Abdomen: Soft, nontender, nondistended, no rebound/guarding Extremities: Warm and well perfused Skin: No rashes or lesions Neurologic: Alert and interactive. No gross deficits.  Assessment/Plan: 65 year old woman with history of SBO s/p resection in 2010 who presented to the ED with complaint of lower abdominal pain for the past 3 days.   Left Renal Infarct: MRI with two wedge-shaped areas of hypoperfusion in the upper pole of the left kidney, likely to reflect renal infarcts. Discussed with radiology who think this is more of an acute to subacute infarct. LDH wnl. No arrhythmias on telemetry. -Heparin gtt -TTE to assess for cardioembolic source -Zofran 4 mg Q6H prn -Tramadol 50 mg Q12H prn  Polyuria: Fasting blood glucose 91. -HgbA1c pending.  Hypokalemia: Resolved  HTN: Normotensive. On carvedilol and amlodipine at home.  -Carvedilol 6.25 mg BID -Amlodipine 10 mg daily  Aortic Atherosclerosis:  -On ASA 81 mg daily -Consider addition of statin   FEN: Heart Healthy DVT/PE ppx: Lovenox SQ QD CODE: Full   Dispo: Anticipated discharge in approximately 1-2 day(s).     Milagros Loll, MD 10/01/2015, 7:28 AM Pager: [phone]

## 2015-10-01 NOTE — Progress Notes (Signed)
  Echocardiogram 2D Echocardiogram has been performed.  [name] 10/01/2015, 3:04 PM

## 2015-10-01 NOTE — Progress Notes (Signed)
Subjective: [name] feels better overall, with only one brief, self-limited episode of abdominal pain similar in character to what she experienced prior to admission. She had a BM this AM which was formed. This was her first formed bowel movement in several days. She has not had palpitations, dizziness or pre-syncope in the past. She occasionally has brief episodes of chest pain that last a few seconds and self-resolve, though she has not had an episode in weeks.   Objective: Vital signs in last 24 hours: Filed Vitals:   09/30/15 1530 09/30/15 1838 09/30/15 2221 10/01/15 0616  BP: 141/62 164/79 141/63 160/78  Pulse: 60 64 67 64  Temp:  99 F (37.2 C) 97.7 F (36.5 C) 98.2 F (36.8 C)  TempSrc:  Oral Oral Oral  Resp: 18 18 17 18   Weight:  71.9 kg (158 lb 8.2 oz)    SpO2: 95% 100% 100% 100%   Weight change:   Intake/Output Summary (Last 24 hours) at 10/01/15 [id number] Last data filed at 10/01/15 [id number]  Gross per 24 hour  Intake  572.5 ml  Output      0 ml  Net  572.5 ml   General: pleasant elderly lady lying in bed in NAD OP: poor dentition with 3 bottom front teeth brown/yellow  CV: Regular rate and rhythm, 2/6 holosystolic murmur best heard at LLSB non radiating Pulm: Normal effort, CTAB, no adventitious sounds Abdomen: Soft, NT, ND abdomen, normoactive bowel sounds, vertical scar at midline well-healed Extremities: Left 3rd digit subungual hematoma without erythema or purulence, no LE edema  Skin: hypopigmented macules on bilateral anterior legs, papules on medial left wrist, no osler nodes, janeway lesions or splinter hemorrhages appreciated   Lab Results: @LABTEST2 @  Micro Results: No results found for this or any previous visit (from the past 240 hour(s)).   Studies/Results: TTEcho pending   Medications: I have reviewed the patient's current medications.  Scheduled Meds: . amLODipine  10 mg Oral Daily  . aspirin EC  81 mg Oral Daily  . carvedilol  6.25 mg Oral BID  WC  . fluticasone  1 spray Each Nare Daily  . sodium chloride flush  3 mL Intravenous Q12H   Continuous Infusions: . 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 Herry is a 65 yo female with PMHx of SBO s/p resection in 2010 who presented to the ED with complaint of lower abdominal pain for the past 3 days.   Left Renal Infarct: In discussion with radiology, the imaging findings of well demarcated edema without inflammatory stranding is consistent with an acute infarct. An alternative explanation is an infiltrative process, though the degree of demarcation makes this less likely. The source of the infarct is likely embolic given non-obstructed L renal artery. The embolic source is currently unknown, though aggressive pursuit of source is warranted. Possible etiologies include valvular vegetations or intracardiac thrombus secondary to arrhythmia or hypokinesis. Patient is awaiting trans-thoracic echo and currently being monitored on telemetry. If negative, TEE would be warranted and patient may need outpatient monitoring to try to capture an arrhythmia event. For now, anticoagulation will be continued, though the echocardiography results will help dictate need for this moving forward. LDH was negative.   - Heparin per pharmacy - continue Telemetry - f/u TTE to assess for cardioembolic  source  Abdominal Pain: Patient is subjectively improving. The etiology of her abdominal pain is most likely the renal infarct as above, though a 3 day history of lower mid-abdomen intermittent, sharp, non-radiating pain associated with subjective fever, chills, malaise, bloating, and loose stools could represent concurrent viral gastroenteritis. Functional SBO may  also be occuring given surgical history. Vital signs remain stable and leukocytosis has resolved. CT/MRI show no evidence of GI/GYN pathology. We wil continue to manage symptoms.  -Zofran 4 mg Q6H prn -Tramadol 50 mg Q12H prn  Increased Urinary Frequency: Chronic. Patient denies urgency, dysuria, hematuria, vaginal discharge, vaginal itching or flank pain. No known history of T2DM. UA is normal.  -f/u HgbA1c   HTN: Pressures elevated late yesterday though patient had not received home medication. On carvedilol and amlodipine at home.  - Carvedilol 6.25 mg BID - Amlodipine 10 mg daily  Aortic Atherosclerosis: Seen on MRI. ASCVD risk 15% if no DM, 30% if DM, moderate to high intensity statin recommended.  - On ASA 81 mg daily - start atorvastatin 40mg  daily   DVT/PE ppx: Lovenox SQ QD CODE: Full FEN: Heart Healthy  Dispo: Admit patient to Observation with expected length of stay less than 2 midnights.  This is a Careers information officer Note.  The care of the patient was discussed with Dr. Jacques Earthly and the assessment and plan formulated with their assistance.  Please see their attached note for official documentation of the daily encounter.    Evert Kohl, Med Student 10/01/2015, 8:28 AM

## 2015-10-01 NOTE — Progress Notes (Signed)
  Date: 10/01/2015  Patient name: [name]  Medical record number: [id number]  Date of birth: 1951/02/22   I have seen and evaluated [name] and discussed their care with the Residency Team. [name] has a h/o HTN and HLD and came to ED for abd pain. Her pain started abruptly 3 days prior to admission and is in her suprapubic area and is intermittent and sharp. She also had some loose stools, N, subjective fevers, chills, and malaise over this time. She has urinary freq but no dysuria. She denies h/o palp or tachycardia. She does have occ CP. She denies dizziness.  PMHx, Fam Hx, and/or Soc Hx : PMHX sig for HTN, HLD, OA, and prior SBO. Fam hx sig for heart dz. She quit tobacco one yr prior.   Filed Vitals:   09/30/15 2221 10/01/15 0616  BP: 141/63 160/78  Pulse: 67 64  Temp: 97.7 F (36.5 C) 98.2 F (36.8 C)  Resp: 17 18   NAD, requires assistance to reposition in bed HRRR + sys murmur LCTAB with good air flow ABD + BS, soft Poor dentition  Cr 0.69 LDL 166 T chol 245 Random glucose 91  We reviewed the CT and MRI with the radiologist and they are most c/w an acute renal infarct due to appearance, clear borders, and lack of stranding.  I personally viewed his EKG and confirmed by reading with the official read. Sinus, inverted T in lateral, inf, and pre-cordials  Assessment and Plan: I have seen and evaluated the patient as outlined above. I agree with the formulated Assessment and Plan as detailed in the residents' note, with the following changes:   1. Acute L renal infarct - her L renal artery is fairly clear and her ABD aorta has only minimal atherosclerosis so this is unlikely to be the cause. She has no known hypercoag condition and has gotten to 63 yrs of age without a VTE so unlike to be 2/2 a hypercoag condition. Therefore, a cardioembolic source is most likely. She has no h/o of an arrhythmia and has been in sinus since admission. We are starting with a TTE -  looking for wall motion abnl that might predispose to a thrombus or a clot from paroxsimal A Fib. Those results are pending. If it shows a source for her renal infarct then the appropriate tx will be started. If it is negative, then she will need a TEE - this may be inpt or outpt (would need tx dose anticoag until clot R/O) depending on cards input. She will also need outpt tele monitoring. Of note, there is no thrombus in her renal artery so she needs no thrombolysis or intervention.   2. Hyperlipidemia - agree with statin.  Bartholomew Crews, MD 7/7/20173:09 PM

## 2015-10-01 NOTE — Progress Notes (Signed)
    [facility] has been requested to perform a transesophageal echocardiogram on 10/04/2015 for Left renal artery infarct .  After careful review of history and examination, the risks and benefits of transesophageal echocardiogram have been explained including risks of esophageal damage, perforation (1:10,000 risk), bleeding, pharyngeal hematoma as well as other potential complications associated with conscious sedation including aspiration, arrhythmia, respiratory failure and death. Alternatives to treatment were discussed, questions were answered. Patient is willing to proceed.   I have reviewed the patients labs, Hgb and platelet count are stable. Her blood pressure has been maintained without the need for pressers.   Reino Bellis, NP-C 10/01/2015 4:41 PM

## 2015-10-02 LAB — HEMOGLOBIN A1C
Hgb A1c MFr Bld: 5.4 % (ref 4.8–5.6)
Mean Plasma Glucose: 108 mg/dL

## 2015-10-02 LAB — BASIC METABOLIC PANEL
ANION GAP: 8 (ref 5–15)
BUN: 11 mg/dL (ref 6–20)
CO2: 25 mmol/L (ref 22–32)
Calcium: 9.2 mg/dL (ref 8.9–10.3)
Chloride: 105 mmol/L (ref 101–111)
Creatinine, Ser: 0.62 mg/dL (ref 0.44–1.00)
GFR calc non Af Amer: >60 mL/min (ref >60)
Glucose, Bld: 98 mg/dL (ref 65–99)
Potassium: 3.8 mmol/L (ref 3.5–5.1)
Sodium: 138 mmol/L (ref 135–145)

## 2015-10-02 LAB — CBC
HCT: 40.2 % (ref 36.0–46.0)
HEMOGLOBIN: 12.9 g/dL (ref 12.0–15.0)
MCH: 30.6 pg (ref 26.0–34.0)
MCHC: 32.1 g/dL (ref 30.0–36.0)
MCV: 95.5 fL (ref 78.0–100.0)
Platelets: 288 10*3/uL (ref 150–400)
RBC: 4.21 MIL/uL (ref 3.87–5.11)
RDW: 13.5 % (ref 11.5–15.5)
WBC: 8.4 10*3/uL (ref 4.0–10.5)

## 2015-10-02 LAB — HEPARIN LEVEL (UNFRACTIONATED): Heparin Unfractionated: 0.49 IU/mL (ref 0.30–0.70)

## 2015-10-02 MED ORDER — DOCUSATE SODIUM 100 MG PO CAPS
100.0000 mg | ORAL_CAPSULE | Freq: Two times a day (BID) | ORAL | Status: DC | PRN
  Administered 2015-10-02 – 2015-10-03 (×3): 100 mg via ORAL
  Filled 2015-10-02 (×3): qty 1

## 2015-10-02 NOTE — Progress Notes (Signed)
   Subjective: Doing well overall. She denies abdominal pain. She had a bowel movement today that was formed.  Objective: Vital signs in last 24 hours: Filed Vitals:   10/01/15 0616 10/01/15 1427 10/01/15 2158 10/02/15 0519  BP: 160/78 159/68 140/53 175/77  Pulse: 64 69 62 67  Temp: 98.2 F (36.8 C) 98.6 F (37 C) 97.9 F (36.6 C) 97.8 F (36.6 C)  TempSrc: Oral Oral Oral Oral  Resp: 18 18 18 17   Weight:      SpO2: 100% 100% 99% 96%   Physical Exam General Apperance: NAD HEENT: Normocephalic, atraumatic, anicteric sclera Neck: Supple, trachea midline Lungs: Clear to auscultation bilaterally. No wheezes, rhonchi or rales. Breathing comfortably Heart: Regular rate and rhythm, no murmur/rub/gallop Abdomen: Soft, nontender, nondistended, no rebound/guarding Extremities: Warm and well perfused Skin: No rashes or lesions Neurologic: Alert and interactive. No gross deficits.  Assessment/Plan: 65 year old woman with history of SBO s/p resection in 2010 who presented to the ED with complaint of lower abdominal pain for the past 3 days.   Left Renal Infarct: MRI with two wedge-shaped areas of hypoperfusion in the upper pole of the left kidney, likely to reflect renal infarcts. Discussed with radiology who think this is more of an acute to subacute infarct. No arrhythmias on telemetry. Echo with LV EF 65-70%, no regional wall motion abnormalities, grade 1 diastolic dysfunction -Heparin gtt -TEE to assess for cardioembolic source. Will need event monitor on discharge -Zofran 4 mg Q6H prn -Tramadol 50 mg Q12H prn  Polyuria: Fasting blood glucose 91. -HgbA1c 5.4.  HTN: Normotensive. On carvedilol and amlodipine at home.  -Carvedilol 6.25 mg BID -Amlodipine 10 mg daily  Aortic Atherosclerosis:  -On ASA 81 mg daily -10-year ASCVD risk ?7.5%, atorvastatin 40mg  daily  FEN: Heart Healthy CODE: Full  Dispo: Anticipated discharge in approximately 2 day(s).   LOS: 1 day    Milagros Loll, MD 10/02/2015, 8:35 AM Pager: [phone]

## 2015-10-02 NOTE — Progress Notes (Signed)
ANTICOAGULATION CONSULT NOTE - Follow Up Consult  Pharmacy Consult for Heparin Indication: renal infarcts  Allergies  Allergen Reactions  . Shrimp [Shellfish Allergy] Hives and Itching  . Latex Itching  . Penicillins Itching and Rash    Patient Measurements: Weight: 158 lb 8.2 oz (71.9 kg) IBW: 57 kg Heparin Dosing Weight: 71 kg  Vital Signs: Temp: 97.8 F (36.6 C) (07/08 0519) Temp Source: Oral (07/08 0519) BP: 175/77 mmHg (07/08 0519) Pulse Rate: 67 (07/08 0519)  Labs:  Recent Labs  09/30/15 0026 10/01/15 0505 10/01/15 0714 10/01/15 1519 10/02/15 0610  HGB 13.0 12.9  --   --  12.9  HCT 40.4 39.8  --   --  40.2  PLT 301 280  --   --  288  HEPARINUNFRC  --   --  0.97* 0.56 0.49  CREATININE 0.72 0.69  --   --   --     Estimated Creatinine Clearance: 68.8 mL/min (by C-G formula based on Cr of 0.69).   Medications:  Infusions:  . heparin 800 Units/hr (10/02/15 0030)    Assessment: 65 year old female admitted 7/6 with lower abdominal pain with CT showing possible renal infarcts. Pharmacy consulted to start IV heparin for anticoagulation.   Heparin level is therapeutic on 800 units/hr.  CBC is stable, no bleeding reported.   Goal of Therapy:  Heparin level 0.3-0.7 units/ml Monitor platelets by anticoagulation protocol: Yes   Plan:  Continue heparin at 800 units/hr.  Heparin level and CBC daily while on therapy Continue to monitor H&H and platelets  Sloan Leiter, PharmD, BCPS Clinical Pharmacist [phone] 10/02/2015,8:31 AM

## 2015-10-02 NOTE — Progress Notes (Signed)
Subjective: Patient is feeling well this AM. She is without HA or abdominal but feels fatigued. Patient notes she had one BM this AM that was normal. She is otherwise without complaint.   Objective: Vital signs in last 24 hours: Filed Vitals:   10/01/15 0616 10/01/15 1427 10/01/15 2158 10/02/15 0519  BP: 160/78 159/68 140/53 175/77  Pulse: 64 69 62 67  Temp: 98.2 F (36.8 C) 98.6 F (37 C) 97.9 F (36.6 C) 97.8 F (36.6 C)  TempSrc: Oral Oral Oral Oral  Resp: 18 18 18 17   Weight:      SpO2: 100% 100% 99% 96%   Weight change:   Intake/Output Summary (Last 24 hours) at 10/02/15 1113 Last data filed at 10/02/15 0600  Gross per 24 hour  Intake    675 ml  Output    350 ml  Net    325 ml   Gen: Pleasant lady sitting in chair in NAD  HEENT: poor dentition noted, has 3 lower frontal teeth with apparent disease CV: 2/6 holosystolic murmur best heard LUSB, non-radiating Pulm: Normal effort, CTAB  AB: Soft, NT, ND, +bowel sounds Extremities: Left 3rd digit subungual hematoma, resolving, no LE edema  Skin: Papules on medial left wrist, scattered depigmented macules on bilateral anterior legs  Lab Results: @LABTEST2 @ Micro Results: No results found for this or any previous visit (from the past 240 hour(s)). Studies/Results: No results found. Medications: I have reviewed the patient's current medications. Scheduled Meds: . amLODipine  10 mg Oral Daily  . aspirin EC  81 mg Oral Daily  . atorvastatin  40 mg Oral q1800  . carvedilol  6.25 mg Oral BID WC  . fluticasone  1 spray Each Nare Daily  . sodium chloride flush  3 mL Intravenous Q12H   Continuous Infusions: . 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 Discussed with radiology who think this is more of an acute to subacute infarct. LDH wnl. SVT last evening with widening QRS. Patient will need event monitoring for discarge to evaluate for paroxysmal atrial fibrillation. TEE showed normal valves and no intracardiac thrombus. Will pursue TEE on Monday. Abdominal pain and concominant symptoms have resolved with only lingering fatigue.  -Heparin gtt per pharmacy.  -TEE to assess for cardioembolic source -Zofran 4 mg Q6H prn -Tramadol 50 mg Q12H prn  Polyuria: Unknown etiology. Fasting blood glucose 91. HbA1C 5.4%  HTN: Stable. On carvedilol and amlodipine at home.  -Carvedilol 6.25 mg BID -Amlodipine 10 mg daily  Aortic Atherosclerosis:  -On ASA 81 mg daily -continue atorvastatin 40mg      FEN: Heart Healthy DVT/PE ppx: Lovenox SQ QD CODE: Full  Dispo: Anticipated discharge in approximately 1-2 day(s).   This is a Careers information officer Note.  The care of the patient was discussed with Dr. Jacques Earthly and the assessment and plan formulated with their assistance.  Please see their attached note for official documentation of the daily encounter.   LOS: 1 day  Evert Kohl, Med Student 10/02/2015, 11:13 AM

## 2015-10-02 NOTE — Progress Notes (Signed)
Notified [name] of pt episode of SVT last evening with widening QRS.  Presently, Sinus Rhythm.  Will continue to monitor this today.

## 2015-10-03 LAB — BASIC METABOLIC PANEL
Anion gap: 7 (ref 5–15)
BUN: 9 mg/dL (ref 6–20)
CHLORIDE: 105 mmol/L (ref 101–111)
CO2: 26 mmol/L (ref 22–32)
CREATININE: 0.76 mg/dL (ref 0.44–1.00)
Calcium: 9.4 mg/dL (ref 8.9–10.3)
GFR calc non Af Amer: >60 mL/min (ref >60)
Glucose, Bld: 104 mg/dL — ABNORMAL HIGH (ref 65–99)
Potassium: 3.7 mmol/L (ref 3.5–5.1)
SODIUM: 138 mmol/L (ref 135–145)

## 2015-10-03 LAB — CBC
HEMATOCRIT: 41.7 % (ref 36.0–46.0)
Hemoglobin: 13.5 g/dL (ref 12.0–15.0)
MCH: 31 pg (ref 26.0–34.0)
MCHC: 32.4 g/dL (ref 30.0–36.0)
MCV: 95.6 fL (ref 78.0–100.0)
Platelets: 282 10*3/uL (ref 150–400)
RBC: 4.36 MIL/uL (ref 3.87–5.11)
RDW: 13.3 % (ref 11.5–15.5)
WBC: 8.5 10*3/uL (ref 4.0–10.5)

## 2015-10-03 LAB — HEPARIN LEVEL (UNFRACTIONATED): HEPARIN UNFRACTIONATED: 0.48 [IU]/mL (ref 0.30–0.70)

## 2015-10-03 NOTE — Progress Notes (Signed)
   Subjective: Doing well this morning with no new complaints. Formed BM today.   Objective: Vital signs in last 24 hours: Filed Vitals:   10/02/15 1136 10/02/15 1338 10/02/15 2146 10/03/15 0528  BP: 157/66 140/66 145/70 162/74  Pulse: 61 61 61 66  Temp: 98.2 F (36.8 C) 98.2 F (36.8 C) 97.5 F (36.4 C) 98.6 F (37 C)  TempSrc: Oral Oral Oral Oral  Resp:  17 17 18   Weight:      SpO2: 99% 100% 97% 96%   Physical Exam General Apperance: NAD HEENT: Normocephalic, atraumatic, anicteric sclera Neck: Supple, trachea midline Lungs: Clear to auscultation bilaterally. No wheezes, rhonchi or rales. Breathing comfortably Heart: Regular rate and rhythm, no murmur/rub/gallop Abdomen: Soft, nontender, nondistended, no rebound/guarding Extremities: Warm and well perfused Skin: No rashes or lesions Neurologic: Alert and interactive. No gross deficits.  Assessment/Plan: 65 year old woman with history of SBO s/p resection in 2010 who presented to the ED with complaint of lower abdominal pain for the past 3 days.   Left Renal Infarct: MRI with two wedge-shaped areas of hypoperfusion in the upper pole of the left kidney, likely to reflect renal infarcts. Discussed with radiology who think this is more of an acute to subacute infarct. No arrhythmias on telemetry. Echo with LV EF 65-70%, no regional wall motion abnormalities, grade 1 diastolic dysfunction -Heparin gtt -TEE to assess for cardioembolic source. Will need event monitor on discharge -Zofran 4 mg Q6H prn -Tramadol 50 mg Q12H prn  Polyuria: Fasting blood glucose 91. -HgbA1c 5.4.  HTN: Normotensive. On carvedilol and amlodipine at home.  -Carvedilol 6.25 mg BID -Amlodipine 10 mg daily  Aortic Atherosclerosis:  -On ASA 81 mg daily -10-year ASCVD risk ?7.5%, atorvastatin 40mg  daily  FEN: Heart Healthy CODE: Full  Dispo: Anticipated discharge in approximately 1 day(s).   LOS: 2 days   Milagros Loll, MD 10/03/2015, 11:40  AM Pager: [phone]

## 2015-10-03 NOTE — Progress Notes (Signed)
ANTICOAGULATION CONSULT NOTE - Follow Up Consult  Pharmacy Consult for Heparin Indication: renal infarcts  Allergies  Allergen Reactions  . Shrimp [Shellfish Allergy] Hives and Itching  . Latex Itching  . Penicillins Itching and Rash    Patient Measurements: Weight: 158 lb 8.2 oz (71.9 kg) IBW: 57 kg Heparin Dosing Weight: 71 kg  Vital Signs: Temp: 97.8 F (36.6 C) (07/08 0519) Temp Source: Oral (07/08 0519) BP: 175/77 mmHg (07/08 0519) Pulse Rate: 67 (07/08 0519)  Labs:  Recent Labs  09/30/15 0026 10/01/15 0505 10/01/15 0714 10/01/15 1519 10/02/15 0610  HGB 13.0 12.9  --   --  12.9  HCT 40.4 39.8  --   --  40.2  PLT 301 280  --   --  288  HEPARINUNFRC  --   --  0.97* 0.56 0.49  CREATININE 0.72 0.69  --   --   --     Estimated Creatinine Clearance: 68.8 mL/min (by C-G formula based on Cr of 0.69).   Medications:  Infusions:  . heparin 800 Units/hr (10/03/15 0554)    Assessment: 65 year old female admitted 7/6 with lower abdominal pain with CT showing possible renal infarcts. Pharmacy consulted to start IV heparin for anticoagulation. TTE negative for wall motion abnormality. Plan for TEE on Monday to rule out cardio-embolic source.   Heparin level remains therapeutic on 800 units/hr.  CBC is stable, no bleeding reported.   Goal of Therapy:  Heparin level 0.3-0.7 units/ml Monitor platelets by anticoagulation protocol: Yes   Plan:  Continue heparin at 800 units/hr.  Heparin level and CBC daily while on therapy Continue to monitor H&H and platelets  Sloan Leiter, PharmD, BCPS Clinical Pharmacist [phone] 10/02/2015,8:31 AM

## 2015-10-03 NOTE — Progress Notes (Signed)
Subjective: Patient is feeling well this AM. She is without HA or fatigue. She noted a brief episode of abdominal pain in her typical location that self-resolved. Patient notes she had one BM this AM that was late yesterday that was normal. She is otherwise without complaint.   Objective: Vital signs in last 24 hours: Filed Vitals:   10/02/15 1136 10/02/15 1338 10/02/15 2146 10/03/15 0528  BP: 157/66 140/66 145/70 162/74  Pulse: 61 61 61 66  Temp: 98.2 F (36.8 C) 98.2 F (36.8 C) 97.5 F (36.4 C) 98.6 F (37 C)  TempSrc: Oral Oral Oral Oral  Resp:  17 17 18   Weight:      SpO2: 99% 100% 97% 96%   Weight change:   Intake/Output Summary (Last 24 hours) at 10/03/15 1055 Last data filed at 10/03/15 0527  Gross per 24 hour  Intake    636 ml  Output   1750 ml  Net  -1114 ml   Gen: Pleasant elderly lady lying in bed in NAD HEENT: sclera anicteric CV: RRR Grade II/VI holosystolic murmur best heard along LSB, non-radiating Pulm: Normal effort, CTAB Abdomen: soft, NT, ND, + bowel sounds Extremities: Left 3rd digit subungual hematoma, improving, no LE edema  Skin: Papules on medial left wrist, scattered depigmented macules on bilateral anterior legs  Lab Results: @LABTEST2 @ Micro Results: No results found for this or any previous visit (from the past 240 hour(s)). Studies/Results: No results found.   Medications: I have reviewed the patient's current medications.  Scheduled Meds: . amLODipine  10 mg Oral Daily  . aspirin EC  81 mg Oral Daily  . atorvastatin  40 mg Oral q1800  . carvedilol  6.25 mg Oral BID WC  . fluticasone  1 spray Each Nare Daily  . sodium chloride flush  3 mL Intravenous Q12H   Continuous Infusions: . 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 MRI with two wedge-shaped areas of hypoperfusion in the upper pole of the left kidney, likely to reflect renal infarcts. Discussed with radiology who think this is more of an acute to subacute infarct. Patient will need event monitoring for discharge to evaluate for paroxysmal atrial fibrillation. TTE showed normal valves and no intracardiac thrombus. Will pursue TEE on Monday.  -Heparin gtt per pharmacy.  -TEE to assess for cardioembolic source Monday  - event monitoring for discharge  -Zofran 4 mg Q6H prn -Tramadol 50 mg Q12H prn  HTN: Stable. On carvedilol and amlodipine at home.  -Carvedilol 6.25 mg BID -Amlodipine 10 mg daily  Aortic Atherosclerosis: Mild, found on imaging.  -On ASA 81 mg daily -continue atorvastatin 40mg    FEN: Heart Healthy DVT/PE ppx: Lovenox SQ QD CODE: Full  Dispo: Anticipated discharge in approximately 1-2 day(s).   This is a Careers information officer Note.  The care of the patient was discussed with Dr. Jacques Earthly and the assessment and plan formulated with their assistance.  Please see their attached note for official documentation of the daily encounter.   LOS: 2 days   Evert Kohl, Med Student 10/03/2015, 10:55  AM

## 2015-10-04 ENCOUNTER — Encounter (HOSPITAL_COMMUNITY): Payer: Self-pay

## 2015-10-04 ENCOUNTER — Encounter (HOSPITAL_COMMUNITY)
Admission: EM | Disposition: A | Discharge status: Home / Self Care | Payer: Self-pay | Source: Home / Self Care | Attending: Internal Medicine

## 2015-10-04 ENCOUNTER — Inpatient Hospital Stay (HOSPITAL_COMMUNITY): Payer: PPO

## 2015-10-04 DIAGNOSIS — I38 Endocarditis, valve unspecified: Secondary | ICD-10-CM

## 2015-10-04 DIAGNOSIS — I330 Acute and subacute infective endocarditis: Secondary | ICD-10-CM | POA: Diagnosis present

## 2015-10-04 DIAGNOSIS — I359 Nonrheumatic aortic valve disorder, unspecified: Secondary | ICD-10-CM

## 2015-10-04 HISTORY — PX: TEE WITHOUT CARDIOVERSION: SHX5443

## 2015-10-04 HISTORY — DX: Acute and subacute infective endocarditis: I33.0

## 2015-10-04 LAB — BASIC METABOLIC PANEL
Anion gap: 8 (ref 5–15)
BUN: 8 mg/dL (ref 6–20)
CALCIUM: 9.7 mg/dL (ref 8.9–10.3)
CO2: 27 mmol/L (ref 22–32)
Chloride: 105 mmol/L (ref 101–111)
Creatinine, Ser: 0.76 mg/dL (ref 0.44–1.00)
GFR calc non Af Amer: >60 mL/min (ref >60)
GLUCOSE: 111 mg/dL — AB (ref 65–99)
Potassium: 4.1 mmol/L (ref 3.5–5.1)
Sodium: 140 mmol/L (ref 135–145)

## 2015-10-04 LAB — CBC
HCT: 42.6 % (ref 36.0–46.0)
Hemoglobin: 14 g/dL (ref 12.0–15.0)
MCH: 31.4 pg (ref 26.0–34.0)
MCHC: 32.9 g/dL (ref 30.0–36.0)
MCV: 95.5 fL (ref 78.0–100.0)
PLATELETS: 311 10*3/uL (ref 150–400)
RBC: 4.46 MIL/uL (ref 3.87–5.11)
RDW: 13.5 % (ref 11.5–15.5)
WBC: 9.2 10*3/uL (ref 4.0–10.5)

## 2015-10-04 LAB — SURGICAL PCR SCREEN
MRSA, PCR: NEGATIVE
STAPHYLOCOCCUS AUREUS: NEGATIVE

## 2015-10-04 LAB — HEPARIN LEVEL (UNFRACTIONATED): Heparin Unfractionated: 0.38 IU/mL (ref 0.30–0.70)

## 2015-10-04 SURGERY — ECHOCARDIOGRAM, TRANSESOPHAGEAL
Anesthesia: Moderate Sedation

## 2015-10-04 MED ORDER — ENOXAPARIN SODIUM 80 MG/0.8ML ~~LOC~~ SOLN
70.0000 mg | Freq: Two times a day (BID) | SUBCUTANEOUS | Status: DC
  Administered 2015-10-04: 70 mg via SUBCUTANEOUS
  Filled 2015-10-04: qty 0.8

## 2015-10-04 MED ORDER — LIDOCAINE VISCOUS 2 % MT SOLN
OROMUCOSAL | Status: AC
Start: 2015-10-04 — End: 2015-10-04
  Filled 2015-10-04: qty 15

## 2015-10-04 MED ORDER — MIDAZOLAM HCL 10 MG/2ML IJ SOLN
INTRAMUSCULAR | Status: DC | PRN
  Administered 2015-10-04: 1 mg via INTRAVENOUS
  Administered 2015-10-04: 2 mg via INTRAVENOUS
  Administered 2015-10-04: 1 mg via INTRAVENOUS

## 2015-10-04 MED ORDER — ENOXAPARIN (LOVENOX) PATIENT EDUCATION KIT
PACK | Freq: Once | Status: AC
  Administered 2015-10-04: 20:00:00
  Filled 2015-10-04: qty 1

## 2015-10-04 MED ORDER — ENOXAPARIN SODIUM 80 MG/0.8ML ~~LOC~~ SOLN: 70.0000 mg | Freq: Two times a day (BID) | SUBCUTANEOUS | Status: DC

## 2015-10-04 MED ORDER — MIDAZOLAM HCL 5 MG/ML IJ SOLN
INTRAMUSCULAR | Status: AC
  Filled 2015-10-04: qty 2

## 2015-10-04 MED ORDER — ATORVASTATIN CALCIUM 40 MG PO TABS: 40.0000 mg | ORAL_TABLET | Freq: Every day | ORAL | Status: DC

## 2015-10-04 MED ORDER — FENTANYL CITRATE (PF) 100 MCG/2ML IJ SOLN
INTRAMUSCULAR | Status: AC
  Filled 2015-10-04: qty 2

## 2015-10-04 MED ORDER — BUTAMBEN-TETRACAINE-BENZOCAINE 2-2-14 % EX AERO
INHALATION_SPRAY | CUTANEOUS | Status: DC | PRN
  Administered 2015-10-04: 2 via TOPICAL

## 2015-10-04 MED ORDER — RIVAROXABAN 15 MG PO TABS: 15.0000 mg | ORAL_TABLET | Freq: Two times a day (BID) | ORAL | Status: DC

## 2015-10-04 MED ORDER — HEPARIN (PORCINE) IN NACL 100-0.45 UNIT/ML-% IJ SOLN
850.0000 [IU]/h | INTRAMUSCULAR | Status: DC
  Administered 2015-10-04: 850 [IU]/h via INTRAVENOUS
  Filled 2015-10-04: qty 250

## 2015-10-04 MED ORDER — FENTANYL CITRATE (PF) 100 MCG/2ML IJ SOLN
INTRAMUSCULAR | Status: DC | PRN
  Administered 2015-10-04 (×2): 25 ug via INTRAVENOUS

## 2015-10-04 MED ORDER — SODIUM CHLORIDE 0.9 % IV SOLN: INTRAVENOUS | Status: DC

## 2015-10-04 NOTE — Progress Notes (Signed)
Heparin stopped at 1845hrs as ordered by pharmacy. lovenox to be administered this evening at 1945hrs

## 2015-10-04 NOTE — Progress Notes (Signed)
  Echocardiogram Echocardiogram Transesophageal has been performed.  Bobbye Charleston 10/04/2015, 8:51 AM

## 2015-10-04 NOTE — Discharge Summary (Signed)
Name: [name] MRN: [id number] DOB: [birth year] 65 y.o. PCP: Boykin Nearing, MD  Date of Admission: 09/30/2015 12:35 AM Date of Discharge: 10/04/2015 Attending Physician: Bartholomew Crews, MD  Discharge Diagnosis: Principal Problem:   Renal infarct [facility]) Active Problems:   Abdominal pain   Essential hypertension   HLD (hyperlipidemia)   Aortic atherosclerosis ([facility])   Aortic valve vegetation   Discharge Medications:   Medication List    TAKE these medications        amLODipine 10 MG tablet  Commonly known as:  NORVASC  Take 1 tablet (10 mg total) by mouth daily.     aspirin EC 81 MG tablet  Take 81 mg by mouth daily.     atorvastatin 40 MG tablet  Commonly known as:  LIPITOR  Take 1 tablet (40 mg total) by mouth daily at 6 PM.     carvedilol 6.25 MG tablet  Commonly known as:  COREG  Take 1 tablet (6.25 mg total) by mouth 2 (two) times daily with a meal.     enoxaparin 80 MG/0.8ML injection  Commonly known as:  LOVENOX  Inject 0.7 mLs (70 mg total) into the skin every 12 (twelve) hours.     ibuprofen 600 MG tablet  Commonly known as:  ADVIL,MOTRIN  Take 1 tablet (600 mg total) by mouth every 6 (six) hours as needed for mild pain or moderate pain.     multivitamin with minerals Tabs tablet  Take 1 tablet by mouth daily.     tiZANidine 4 MG tablet  Commonly known as:  ZANAFLEX  TAKE 1 TABLET BY MOUTH EVERY 6 HOURS AS NEEDED FOR MUSCLE SPASMS     traMADol 50 MG tablet  Commonly known as:  ULTRAM  TAKE 1 TABLET BY MOUTH EVERY 12 HOURS AS NEEDED FOR MODERATE PAIN        Disposition and follow-up:   [name] was discharged from [facility] in Stable condition.  At the hospital follow up visit please address:  1.  Left Renal Infarct: If she were to have a recurrent thrombotic event, will need to consider surgery. Also needs to have her teeth extracted. At discharged she was instructed to take Lovenox injections as a bridge to  warfarin. She will need blood cultures rechecked at follow up with her PCP. Antibiotics only if blood cultures turn positive.   2.  Labs / imaging needed at time of follow-up: BMP, CBC, PT/INR, Blood cultures x 2  3.  Pending labs/ test needing follow-up: Blood culture  Follow-up Appointments:     Follow-up Information    Follow up with Minerva Ends, MD. Go on 10/14/2015.   Specialty:  Family Medicine   Why:  Appointment is 04:00PM for hospital follow-up and draw blood cultures    Contact information:   [place] [address] [phone]       Hospital Course by problem list: Principal Problem:   Renal infarct [facility]) Active Problems:   Abdominal pain   Essential hypertension   HLD (hyperlipidemia)   Aortic atherosclerosis (HCC)   Aortic valve vegetation   1. Left Renal Infarct: She presented with lower abdominal pain for the past 3 days. She was admitted for further workup. Her MRI demonstrated two wedge-shaped areas of hypoperfusion in the upper pole of the left kidney, likely to reflect renal infarcts. Upon discussion with radiology, it was felt that the infarcts were more acute to subacute in appearance. She was placed  on a heparin drip. She was placed on telemetry with no evidence of atrial fibrillation or flutter. She had a TEE done which showed moderated dilated left atrium, small mobile thin filamentous density on ventricular side of aortic valve consistent with vegetation. 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 Blood cultures were drawn. She was seen in consultation by cardiology who recommended anticoagulation and if she were to have a recurrent event, to consider surgery. Also needs to have her teeth extracted. At discharged she was instructed to take Lovenox injections as a bridge to warfarin. She will need blood cultures rechecked at follow up with her PCP. Antibiotics only if blood cultures turn positive.   2. Aoric atherosclerosis:  Her 10 year ASCVD risk is greater than 7.5%. She was started on atorvastatin 40mg  daily.  Discharge Vitals:   BP 144/65 mmHg  Pulse 74  Temp(Src) 98.6 F (37 C) (Oral)  Resp 18  Wt 158 lb 8.2 oz (71.9 kg)  SpO2 99%  Pertinent Labs, Studies, and Procedures:  CBC    Component Value Date/Time   WBC 9.2 10/04/2015 1012   RBC 4.46 10/04/2015 1012   HGB 14.0 10/04/2015 1012   HCT 42.6 10/04/2015 1012   PLT 311 10/04/2015 1012   MCV 95.5 10/04/2015 1012   MCH 31.4 10/04/2015 1012   MCHC 32.9 10/04/2015 1012   RDW 13.5 10/04/2015 1012   LYMPHSABS 2.2 08/08/2015 0719   MONOABS 0.6 08/08/2015 0719   EOSABS 0.3 08/08/2015 0719   BASOSABS 0.1 08/08/2015 0719   BMP Latest Ref Rng 10/04/2015 10/03/2015 10/02/2015  Glucose 65 - 99 mg/dL 111(H) 104(H) 98  BUN 6 - 20 mg/dL 8 9 11   Creatinine 0.44 - 1.00 mg/dL 0.76 0.76 0.62  Sodium 135 - 145 mmol/L 140 138 138  Potassium 3.5 - 5.1 mmol/L 4.1 3.7 3.8  Chloride 101 - 111 mmol/L 105 105 105  CO2 22 - 32 mmol/L 27 26 25   Calcium 8.9 - 10.3 mg/dL 9.7 9.4 9.2    Discharge Instructions: Discharge Instructions    Call MD for:  difficulty breathing, headache or visual disturbances    Complete by:  As directed      Call MD for:  persistant dizziness or light-headedness    Complete by:  As directed      Call MD for:  persistant nausea and vomiting    Complete by:  As directed      Call MD for:  severe uncontrolled pain    Complete by:  As directed      Call MD for:  temperature >100.4    Complete by:  As directed      Diet - low sodium heart healthy    Complete by:  As directed      Discharge instructions    Complete by:  As directed   We will call you tomorrow with instructions on how to continue to take your blood thinner. Take your blood thinner (Lovenox) injection twice a day. Please keep a record of your temperature every day. We will call you with a follow up appointment.     Increase activity slowly    Complete by:  As directed              Signed: Milagros Loll, MD 10/04/2015, 6:47 PM   Pager: [phone]

## 2015-10-04 NOTE — Progress Notes (Signed)
ANTICOAGULATION CONSULT NOTE - Follow Up Consult  Pharmacy Consult:  Heparin > Lovenox Indication:  Renal infarcts  Allergies  Allergen Reactions  . Shrimp [Shellfish Allergy] Hives and Itching  . Latex Itching  . Penicillins Itching and Rash    Has patient had a PCN reaction causing immediate rash, facial/tongue/throat swelling, SOB or lightheadedness with hypotension:  Has patient had a PCN reaction causing severe rash involving mucus membranes or skin necrosis:  Has patient had a PCN reaction that required hospitalization  Has patient had a PCN reaction occurring within the last 10 years:  If all of the above answers are "NO", then may proceed with Cephalosporin use.    Patient Measurements: Weight: 158 lb 8.2 oz (71.9 kg) Heparin Dosing Weight: 71 kg  Vital Signs: Temp: 98.6 F (37 C) (07/10 1504) Temp Source: Oral (07/10 1504) BP: 144/65 mmHg (07/10 1504) Pulse Rate: 74 (07/10 1504)  Labs:  Recent Labs  10/02/15 0610 10/02/15 1034 10/03/15 0505 10/04/15 1012  HGB 12.9  --  13.5 14.0  HCT 40.2  --  41.7 42.6  PLT 288  --  282 311  HEPARINUNFRC 0.49  --  0.48 0.38  CREATININE  --  0.62 0.76 0.76    Estimated Creatinine Clearance: 68.8 mL/min (by C-G formula based on Cr of 0.76).    Assessment: 20 YOF with renal infarcts to continue on IV heparin.  Heparin level is therapeutic; no bleeding reported.  PM f/u > asked to change heparin to Lovenox in anticipation of discharge.  CBC, Pltc stable.  Goal of Therapy:  Monitor platelets by anticoagulation protocol: Yes  Plan:  -D/c heparin gtt now. -Give Lovenox dose 1 hr after heparin gtt turned off. -Lovenox kit to patient for education.  Uvaldo Rising, BCPS  Clinical Pharmacist Pager [phone]  10/04/2015 6:25 PM

## 2015-10-04 NOTE — CV Procedure (Signed)
    PROCEDURE NOTE:  Procedure:  Transesophageal echocardiogram Operator:  Fransico Him, MD Indications:  Renal infarct Complications: None  During this procedure the patient is administered a total of Versed 4 mg and Fentanyl 50 mg to achieve and maintain moderate conscious sedation.  The patient's heart rate, blood pressure, and oxygen saturation are monitored continuously during the procedure. The period of conscious sedation is 21 minutes, of which I was present face-to-face 100% of this time.  Results: Normal LV size and function Normal RV size and function Normal RA Moderately dilated  LA Normal TV with trivial TR Normal PV with trivial PR Normal MV Trileaflet aortic valve with mildly thickened and calcified leaflets.  There appears to be a thin mobile density on the ventricular side of the aortic valve leaflet consistent with vegetation.  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   Normal interatrial septum with no evidence of shunt by colorflow dopper or agitated saline contrast injection Normal thoracic and ascending aorta.  The patient tolerated the procedure well and was transferred back to their room in stable condition.  Signed: Fransico Him, MD [facility]

## 2015-10-04 NOTE — Interval H&P Note (Signed)
History and Physical Interval Note:  10/04/2015 8:17 AM  [name]  has presented today for surgery, with the diagnosis of RENAL INFARCT   The various methods of treatment have been discussed with the patient and family. After consideration of risks, benefits and other options for treatment, the patient has consented to  Procedure(s): TRANSESOPHAGEAL ECHOCARDIOGRAM (TEE) (N/A) as a surgical intervention .  The patient's history has been reviewed, patient examined, no change in status, stable for surgery.  I have reviewed the patient's chart and labs.  Questions were answered to the patient's satisfaction.     Fransico Him

## 2015-10-04 NOTE — Care Management Note (Signed)
Case Management Note  Patient Details  Name: [name] MRN: [id number] Date of Birth: 1950/09/13  Subjective/Objective:  Patient presented to [facility] ED with abdominal pains. Patient was diagnosed with Renal Infarct and started on heparin drip.                  Action/Plan: CM received consult from Dr. Randell Patient concerning patient being discharge home today starting on Lovenox coumadin bridge, Patient will receive first dose tonight prior to discharge, and a prescription for Lovenox to fill, patient has [employer] insurance coverage, CM is unable to check benefits at this time.  [facility] RN services recommended for medication management.  CM met with patient bedside to discuss discharge recommendations patient is agreeable with d/c plan. Offered choice patient selected [facility]. Referral faxed to [facility] 336 [phone]. Received fax conformation. Patient is active at the [facility]. CM contacted clinic CM to follow up with patient. she has had 6 ED visits in the past 6 months. This patient would benefit from TCC program,  Patient denies any additional questions or concerns.  No further CM needs identified.  Expected Discharge Date:      10/04/15            Expected Discharge Plan:  [place]  In-House Referral:     Discharge planning Services  CM Consult  Post Acute Care Choice:    Choice offered to:  Patient  DME Arranged:    DME Agency:     HH Arranged:  RN [place] Agency:  [place]  Status of Service:  Completed, signed off  If discussed at [place] of Stay Meetings, dates discussed:    Additional CommentsLaurena Slimmer, RN 10/04/2015, 9:56 PM

## 2015-10-04 NOTE — Progress Notes (Signed)
ANTICOAGULATION CONSULT NOTE - Follow Up Consult  Pharmacy Consult:  Heparin Indication:  Renal infarcts  Allergies  Allergen Reactions  . Shrimp [Shellfish Allergy] Hives and Itching  . Latex Itching  . Penicillins Itching and Rash    Has patient had a PCN reaction causing immediate rash, facial/tongue/throat swelling, SOB or lightheadedness with hypotension:  Has patient had a PCN reaction causing severe rash involving mucus membranes or skin necrosis Has patient had a PCN reaction that required hospitalization  Has patient had a PCN reaction occurring within the last 10 years:  If all of the above answers are "NO", then may proceed with Cephalosporin use.    Patient Measurements: Weight: 158 lb 8.2 oz (71.9 kg) Heparin Dosing Weight: 71 kg  Vital Signs: Temp: 97.6 F (36.4 C) (07/10 0917) Temp Source: Oral (07/10 0917) BP: 147/97 mmHg (07/10 0917) Pulse Rate: 60 (07/10 0917)  Labs:  Recent Labs  10/02/15 0610 10/02/15 1034 10/03/15 0505 10/04/15 1012  HGB 12.9  --  13.5 14.0  HCT 40.2  --  41.7 42.6  PLT 288  --  282 311  HEPARINUNFRC 0.49  --  0.48 0.38  CREATININE  --  0.62 0.76  --     Estimated Creatinine Clearance: 68.8 mL/min (by C-G formula based on Cr of 0.76).    Assessment: 62 YOF with renal infarcts to continue on IV heparin.  Heparin level is therapeutic; no bleeding reported.   Goal of Therapy:  Heparin level 0.3-0.7 units/ml Monitor platelets by anticoagulation protocol: Yes    Plan:  - Increase heparin gtt slightly to 850 units/hr - Daily HL / CBC - F/U plan with positive TEE   Stela Iwasaki D. Mina Marble, PharmD, BCPS Pager:  [phone] 10/04/2015, 11:20 AM
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
## 2015-10-04 NOTE — Progress Notes (Signed)
   Subjective: Doing well overall. Denies abdominal pain.  Objective: Vital signs in last 24 hours: Filed Vitals:   10/04/15 0840 10/04/15 0852 10/04/15 0900 10/04/15 0917  BP: 151/83 150/65 159/62 147/97  Pulse: 79 75  60  Temp:    97.6 F (36.4 C)  TempSrc:    Oral  Resp: 15 17  18   Weight:      SpO2: 100% 97%  100%   Physical Exam General Apperance: NAD HEENT: Normocephalic, atraumatic, anicteric sclera Neck: Supple, trachea midline Lungs: Clear to auscultation bilaterally. No wheezes, rhonchi or rales. Breathing comfortably Heart: Regular rate and rhythm, no murmur/rub/gallop Abdomen: Soft, nontender, nondistended, no rebound/guarding Extremities: Warm and well perfused Skin: No rashes or lesions Neurologic: Alert and interactive. No gross deficits.  Assessment/Plan: 65 year old woman with history of SBO s/p resection in 2010 who presented to the ED with complaint of lower abdominal pain for the past 3 days.   Left Renal Infarct: MRI with two wedge-shaped areas of hypoperfusion in the upper pole of the left kidney, likely to reflect renal infarcts. Discussed with radiology who think this is more of an acute to subacute infarct. No arrhythmias on telemetry. Moderately dilated left atrium, small mobile thin filamentous density on ventricular side of aortic valve consistent with vegetation on TEE. 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 -Heparin gtt. Will discuss with cardiology anticoagulation given not clearly infectious endocarditis. Consider event monitoring given atrial dilation. -Discussed with ID. Will obtain blood cultures x 2 and have her follow up within 1 week for repeat blood cultures. Will have her keep a temperature log. -Zofran 4 mg Q6H prn -Tramadol 50 mg Q12H prn  HTN:  -Carvedilol 6.25 mg BID -Amlodipine 10 mg daily  Aortic Atherosclerosis:  -On ASA 81 mg daily -10-year ASCVD risk ?7.5%, atorvastatin 40mg  daily  FEN: Heart Healthy CODE:  Full  Dispo: Likely home today   LOS: 3 days   Milagros Loll, MD 10/04/2015, 11:56 AM Pager: [phone]

## 2015-10-04 NOTE — Progress Notes (Signed)
Subjective: Patient is feeling better this morning. She has had no abdominal pain and her BMs yesterday and today have been formed. Patient notes her energy today is good. She denies HA, chest pain, dyspnea.   Objective: Vital signs in last 24 hours: Filed Vitals:   10/04/15 0840 10/04/15 0852 10/04/15 0900 10/04/15 0917  BP: 151/83 150/65 159/62 147/97  Pulse: 79 75  60  Temp:    97.6 F (36.4 C)  TempSrc:    Oral  Resp: 15 17  18   Weight:      SpO2: 100% 97%  100%   Weight change:   Intake/Output Summary (Last 24 hours) at 10/04/15 1137 Last data filed at 10/04/15 [id number]  Gross per 24 hour  Intake    300 ml  Output   2000 ml  Net  -1700 ml  Gen: Pleasant elderly lady seated in chair in NAD HEENT: sclera anicteric CV: RRR Grade II/VI holosystolic murmur best heard along LUSB, non-radiating Pulm: Normal effort, CTAB Abdomen: soft, NT, ND, + bowel sounds Extremities: Left 3rd digit subungual hematoma, improving, no LE edema  Skin: Papules on medial left wrist, scattered depigmented macules on bilateral anterior legs  Lab Results: @LABTEST2 @ Micro Results: Recent Results (from the past 240 hour(s))  Surgical pcr screen     Status: None   Collection Time: 10/04/15  1:00 AM  Result Value Ref Range Status   MRSA, PCR NEGATIVE NEGATIVE Final   Staphylococcus aureus NEGATIVE NEGATIVE Final    Comment:        The Xpert SA Assay (FDA approved for NASAL specimens in patients over 33 years of age), is one component of a comprehensive surveillance program.  Test performance has been validated by [facility] for patients greater than or equal to 5 year old. It is not intended to diagnose infection nor to guide or monitor treatment.    Studies/Results: No results found. Medications: I have reviewed the patient's current medications. Scheduled Meds: . amLODipine  10 mg Oral Daily  . aspirin EC  81 mg Oral Daily  . atorvastatin  40 mg Oral q1800  . carvedilol  6.25 mg  Oral BID WC  . fluticasone  1 spray Each Nare Daily  . sodium chloride flush  3 mL Intravenous Q12H   Continuous Infusions: . heparin 850 Units/hr (10/04/15 1136)   PRN Meds:.acetaminophen **OR** acetaminophen, docusate sodium, ondansetron, tiZANidine, traMADol Assessment/Plan: Principal Problem:   Renal infarct ([place]) Active Problems:   Abdominal pain   Essential hypertension   HLD (hyperlipidemia)   Aortic atherosclerosis ([place])  Ms. Ronnald Ramp is a 65 year old woman with history of SBO s/p resection in 2010 who presented to the ED with complaint of lower abdominal pain, admitted for evaluation and being managed for L Renal infarct   Left Renal Infarct: MRI with two wedge-shaped areas of hypoperfusion in the upper pole of the left kidney, likely to reflect renal infarcts, per radiology acute to subacute. Trans-thoracic echo with LV EF 65-70%, no regional wall motion abnormalities, grade 1 diastolic dysfunction. Trans-eshophageal echo showing thin mobile density on the ventricular side of the aortic valve leaflet consistent with vegetation. 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 Study also showed moderately dilated LA. The potential vegetation and Lambl excrescence could represent source of renal infarct. For Lambl excresence, the literature suggests conservative management unless sign of CVA. It appears anticoagulation could be stopped at this time, though will confirm with cardiology. Moderate LA dilation  could be secondary to valvular issue and causing paroxysmal AFib. Patient is without event on telemetry for >36 hours, will discuss with cardiology need for outpatient event monitoring given moderate LA dilation though alternative embolic source.  - stop Heparin gtt -Zofran 4 mg Q6H prn -Tramadol 50 mg Q12H prn  Polyuria: Fasting blood glucose 91. HgbA1c 5.4.  HTN: Normotensive though isolated HTN throughout admission. On carvedilol and amlodipine  at home. Would likely benefit from additional agent like HCTZ in outpatient setting.  -Carvedilol 6.25 mg BID -Amlodipine 10 mg daily  Aortic Atherosclerosis: Found on MRI, 10-year ASCVD risk ?7.5%.  -ASA 81 mg daily -atorvastatin 40mg  daily  FEN: Heart Healthy CODE: Full  Dispo: Anticipated discharge in approximately 0 day(s).   This is a Careers information officer Note.  The care of the patient was discussed with Dr. Jacques Earthly and the assessment and plan formulated with their assistance.  Please see their attached note for official documentation of the daily encounter.   LOS: 3 days   Evert Kohl, Med Student 10/04/2015, 11:37 AM

## 2015-10-04 NOTE — Progress Notes (Signed)
[name] to be D/C'd  per MD order. Discussed with the patient and all questions fully answered.  VSS, Skin clean, dry and intact without evidence of skin break down, no evidence of skin tears noted.  IV catheter discontinued intact. Site without signs and symptoms of complications. Dressing and pressure applied.  An After Visit Summary was printed and given to the patient. Patient received prescription.  D/c education completed with patient/family including follow up instructions, medication list, d/c activities limitations if indicated, with other d/c instructions as indicated by MD - patient able to verbalize understanding, all questions fully answered.  Performed Lovenox education with patient. Had patient perform teach back and demonstration of Lovenox self-administration. Patient self-administered first dose of Lovenox before leaving with family.   Patient instructed to return to ED, call 911, or call MD for any changes in condition.   Patient to be escorted via [place], and D/C home via private auto.

## 2015-10-04 NOTE — Progress Notes (Signed)
Heparin drip rate increased to 8.30ml/hr

## 2015-10-05 ENCOUNTER — Telehealth: Payer: Self-pay

## 2015-10-05 ENCOUNTER — Ambulatory Visit: Payer: Self-pay | Admitting: Pharmacist

## 2015-10-05 NOTE — Progress Notes (Signed)
Pt called back and said that she cannot afford the co pay for lovenox and is asking to have medication switched to something else she can afford. Reporting nurse informed caller that he will page the discharging MD and notify her of her request. MD paged but no return call received yet

## 2015-10-05 NOTE — Progress Notes (Addendum)
Medication Samples have been provided to the patient. Patient has a follow up appointment on 10/14/15 with Dr. Adrian Blackwater  Drug: Enoxaparin (Lovenox) Strength: 80 mg Qty: 20 LOT: [id number] Exp.Date: 11/2017 Dosing instructions: 70 mg BID  The patient has been instructed regarding the correct time, dose, and frequency of taking this medication, including desired effects and most common side effects. Contacted [facility] and found her copay for enoxaparin is $60 per month. Also contacted [facility] to notify them patient may need cost help for ongoing access to enoxaparin.   [name] J 5:27 PM 10/05/2015

## 2015-10-05 NOTE — Telephone Encounter (Signed)
Call placed to  Laurena Slimmer, RN CM to discuss  the patient's referral to  the [facility]. The patient already has an appointment scheduled with her PCP - Dr Adrian Blackwater on 10/14/15 @1600 .  Call placed to the patient to check on her status. She stated that she is "so so" and has all of her medications from the pharmacy except the lovenox. She said that the lovenox is $59 and she can't afford that. She said that she called her insurance company and was instructed to contact her doctor about possibly switching to "something less expensive." The patient did state that she was given a  10 day supply  of lovenox when she left the hospital.  Informed her that this CM will speak to the [facility] and will also discuss with Dr Adrian Blackwater. She did not report any other concerns at this time.   This CM spoke to Phillis Knack, [facility] Pharmacist, who stated that the cost for the lovenox could not be waived due to the patient's insurance, [employer],  but the the charges could be put on an account for the patient to pay off over time.    Update provided to Dr Adrian Blackwater who recommended  that the patient can come to the clinic to have her INR checked with Nicoletta Ba, [place] next week and she can re-assess the lovenox orders. This CM to speak to  Dewaine Oats, [facility] tomorrow about scheduling an appointment for an INR and then follow up with the patient.

## 2015-10-06 ENCOUNTER — Telehealth: Payer: Self-pay

## 2015-10-06 ENCOUNTER — Telehealth: Payer: Self-pay | Admitting: Family Medicine

## 2015-10-06 DIAGNOSIS — N280 Ischemia and infarction of kidney: Secondary | ICD-10-CM | POA: Diagnosis not present

## 2015-10-06 DIAGNOSIS — Z87891 Personal history of nicotine dependence: Secondary | ICD-10-CM | POA: Diagnosis not present

## 2015-10-06 DIAGNOSIS — S6992XS Unspecified injury of left wrist, hand and finger(s), sequela: Secondary | ICD-10-CM

## 2015-10-06 DIAGNOSIS — I10 Essential (primary) hypertension: Secondary | ICD-10-CM | POA: Diagnosis not present

## 2015-10-06 DIAGNOSIS — I700 Atherosclerosis of aorta: Secondary | ICD-10-CM | POA: Diagnosis not present

## 2015-10-06 DIAGNOSIS — Z7901 Long term (current) use of anticoagulants: Secondary | ICD-10-CM | POA: Diagnosis not present

## 2015-10-06 MED ORDER — WARFARIN SODIUM 5 MG PO TABS: 5.0000 mg | ORAL_TABLET | Freq: Every day | ORAL | Status: DC
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
## 2015-10-06 NOTE — Progress Notes (Signed)
Warfarin 5mg  daily sent to patient's pharmacy. Noted case manager note that she spoke to Dr. Adrian Blackwater who said she can have her INR checked with Nicoletta Ba, [place] next week.  Called and discussed new medication with patient on phone. Also discussed dietary recommendations in particular that she should maintain the same level of vitamin K intake every day. She understands she needs to be seen in her clinic early next week for INR check.

## 2015-10-06 NOTE — Telephone Encounter (Signed)
Patient called and is needing clarity as to why she on two blood thinners. Please follow up.

## 2015-10-06 NOTE — Addendum Note (Signed)
Addended by: Jacques Earthly T on: 10/06/2015 09:00 AM   Modules accepted: Orders

## 2015-10-07 ENCOUNTER — Ambulatory Visit: Payer: PPO | Attending: Family Medicine | Admitting: Pharmacist

## 2015-10-07 DIAGNOSIS — N280 Ischemia and infarction of kidney: Secondary | ICD-10-CM

## 2015-10-07 LAB — POCT INR: INR: 1

## 2015-10-07 NOTE — Telephone Encounter (Signed)
Will forward to pcp

## 2015-10-07 NOTE — Telephone Encounter (Signed)
That is a good question,   She is currently on a lovenox to coumadin bridge which means...   She is temporarily on lovenox injections to thin her blood while to takes coumadin and waits for the coumadin to reach the therapeutic level in her blood as indicated by a sustained INR between 2-3.   If she were just on coumadin she would be at high risk for another clot until the INR becomes therapeutic.   Her INR today is 1.0  Once it is 2-3 for two consecutive checks she can stop lovenox.   The INR is a marker of clotting and tells [place] how thin her blood is. The higher the INR number the thinner the blood.   Lab Results  Component Value Date   INR 1.0 10/07/2015

## 2015-10-08 NOTE — Telephone Encounter (Signed)
Contacted pt to inform her that per Dr. Adrian Blackwater she is currently taking tramadol for chronic pain. She will need to check with pharmacy because she was prescribed quantity of 60 with 2 refills on 07/27/15. She will be due for a refill on 10/27/15. Pt did not answer was unable to lvm

## 2015-10-08 NOTE — Telephone Encounter (Signed)
Patient takes tramadol for chronic pain  She should check with pharmacy as she was prescribed 60 with two refills on 07/27/15  She is due for refill on 10/27/15

## 2015-10-08 NOTE — Telephone Encounter (Signed)
Contacted patient to go over the notes Dr. Adrian Blackwater wrote and patient states she needs a refill on her tylenol 3

## 2015-10-09 LAB — CULTURE, BLOOD (ROUTINE X 2)
Culture: NO GROWTH
Culture: NO GROWTH

## 2015-10-11 ENCOUNTER — Telehealth: Payer: Self-pay

## 2015-10-11 ENCOUNTER — Encounter: Payer: Self-pay | Admitting: Pharmacist

## 2015-10-11 ENCOUNTER — Telehealth: Payer: Self-pay | Admitting: Pharmacist

## 2015-10-11 NOTE — Telephone Encounter (Signed)
Pt stated she got her tramadol filled on June 30th and she is out of the tramadol also pt states she got tylenol 3 from the hospital when she injury her finger and they only gave her 10 tablets . Pt states the tylenol 3 works better for her pain than tramadol.

## 2015-10-11 NOTE — Telephone Encounter (Signed)
PT. Called requesting a Rx for Tylenol # 3. Pt. Stated her finger hurts and needs medication. Please f/u with pt.

## 2015-10-11 NOTE — Telephone Encounter (Signed)
Patient should get her tramadol refill for the pharmacy See previous message from 10/08/15

## 2015-10-11 NOTE — Telephone Encounter (Signed)
Will forward to pcp

## 2015-10-11 NOTE — Telephone Encounter (Signed)
Call received from [place], [facility] home health nurse who reported that the patient's INR : 1.4, PT: 17.1. She said that she confirmed that the patient is taking the coumadin 5mg  daily  as well as the lovenox.  She noted that the patient has been taking the coumadin in the morning.   Update provided to Arneta Cliche, [facility].

## 2015-10-11 NOTE — Telephone Encounter (Signed)
Called patient to follow up on warfarin dosing.  INR today was 1.4 at home. Goal is 2-3 and patient currently being bridged on Lovenox. I would like to aggressively treat her to get her INR to goal as cost is an issue for her and I am worried she will not be able to afford a Lovenox refill when she runs out 10/14/15. Plan to take 2.5 mg extra today for a total of 7.5 mg of warfarin today. However, unable to reach patient, left a voicemail for her to return my call.

## 2015-10-11 NOTE — Telephone Encounter (Addendum)
Call placed to the patient to inquire why she did not meet with Nicoletta Ba, [facility] this morning for an INR check. She said that she thought she was supposed to come back to [facility] on 09/2015 , not today. She said that she is taking the coumadin as ordered as well as the lovenox injections. Reminded her why it is important to have her INR checked . Instructed her to come to the [facility] tomorrow at 1000 to have her INR checked by Nicoletta Ba, [facility]. She then stated the her nurse from [facility])  is coming to see today and noted that she is paying $25/visit for the nurse.   Call placed to [facility] and spoke to [facility] who transferred the call to the patient's nurse, Briant Cedar who confirmed that she will be seeing the patient in less than an hour.  Inquired if she would be able to do an INR in the home. She said that she has the equipment/supplies but would need an order. She also noted today will be her last day to see the patient as the insurance company has only authorized 2 visits. She stated that she saw the patient for the first visit last week to instruct her about lovenox administation.   Spoke to Nicoletta Ba, [facility] who approved for the INR to be checked today by the home health nurse. After receiving the INR results, Dewaine Oats, [facility] will determine the plan for follow up.  Call placed to [facility], [facility] nurse and informed her to check the INR at the visit today and call this CM with the results and confirm that the patient has been taking both the lovenox and coumadin.

## 2015-10-12 ENCOUNTER — Ambulatory Visit (HOSPITAL_BASED_OUTPATIENT_CLINIC_OR_DEPARTMENT_OTHER): Payer: PPO | Admitting: Pharmacist

## 2015-10-12 DIAGNOSIS — N280 Ischemia and infarction of kidney: Secondary | ICD-10-CM

## 2015-10-12 LAB — POCT INR: INR: 2.2

## 2015-10-12 MED ORDER — ACETAMINOPHEN-CODEINE #3 300-30 MG PO TABS: 1.0000 | ORAL_TABLET | Freq: Three times a day (TID) | ORAL | Status: DC | PRN

## 2015-10-12 NOTE — Telephone Encounter (Signed)
Noted  Tylenol #3 refilled, 20 pills. Patient called verified name and DOB She is aware that tylenol #3 is ready for pick up

## 2015-10-14 ENCOUNTER — Encounter: Payer: Self-pay | Admitting: Family Medicine

## 2015-10-14 ENCOUNTER — Ambulatory Visit: Payer: PPO | Attending: Family Medicine | Admitting: Family Medicine

## 2015-10-14 VITALS — BP 122/70 | HR 67 | Temp 98.5°F | Ht 65.0 in | Wt 154.6 lb

## 2015-10-14 DIAGNOSIS — I330 Acute and subacute infective endocarditis: Secondary | ICD-10-CM

## 2015-10-14 DIAGNOSIS — M47816 Spondylosis without myelopathy or radiculopathy, lumbar region: Secondary | ICD-10-CM | POA: Diagnosis not present

## 2015-10-14 DIAGNOSIS — K089 Disorder of teeth and supporting structures, unspecified: Secondary | ICD-10-CM

## 2015-10-14 DIAGNOSIS — N280 Ischemia and infarction of kidney: Secondary | ICD-10-CM | POA: Diagnosis not present

## 2015-10-14 DIAGNOSIS — I823 Embolism and thrombosis of renal vein: Secondary | ICD-10-CM | POA: Insufficient documentation

## 2015-10-14 LAB — POCT INR: INR: 3.7

## 2015-10-14 MED ORDER — WARFARIN SODIUM 5 MG PO TABS: 5.0000 mg | ORAL_TABLET | Freq: Every day | ORAL | Status: DC

## 2015-10-14 MED ORDER — TIZANIDINE HCL 4 MG PO TABS: 4.0000 mg | ORAL_TABLET | Freq: Three times a day (TID) | ORAL | Status: DC | PRN

## 2015-10-14 NOTE — Assessment & Plan Note (Signed)
With poor dentition  Repeat CBC and blood cultures Dental referral placed

## 2015-10-14 NOTE — Patient Instructions (Addendum)
[name] was seen today for hospitalization follow-up.  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  Lumbar spondylosis, unspecified spinal osteoarthritis -     tiZANidine (ZANAFLEX) 4 MG tablet; Take 1 tablet (4 mg total) by mouth every 8 (eight) hours as needed for muscle spasms.    Do not take coumadin tomorrow, Start back on coumadin on Saturday   F/u with [facility] for INR check on Tuesday August 25th   F/u with provider in 6 months for HTN and renal infarct   Dr. Adrian Blackwater

## 2015-10-14 NOTE — Assessment & Plan Note (Signed)
No abdominal pain INR is therapeutic x 2  INR is supra therapeutic today   Plan: Dc lovenox conitnue comadin but hold tomorrow's dose Close f/u in 5 days for repeat INR check  Repeat blood Cx, BMP, CBC and dental referral

## 2015-10-14 NOTE — Progress Notes (Signed)
Subjective:  Patient ID: [name], female    DOB: [birth year]  Age: 65 y.o. MRN: [id number]  CC: Hospitalization Follow-up   HPI [name] has HTN, HLD she presents for   1. HFU L renal infarct: she was admitted from 7/6-7/10/17 for loewr abdominal pain. Abdominal  CT and MRI findings demonstrated two wedge-shaped areas of hypoperfusion in the upper pole of the left kidney, likely to reflect renal infarcts.  She was placed on a heparin drip. She was placed on telemetry with no evidence of atrial fibrillation or flutter. She had a TEE done which showed moderated dilated left atrium, small mobile thin filamentous density on ventricular side of aortic valve consistent with vegetation. 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 Blood cultures were drawn. Blood cultures were negative. She was transitioned to lovenox and coumadin. She denies ongoing abdominal pain. She denies bleeding and bruising. She admits to R leg pain today.  Social History  Substance Use Topics  . Smoking status: Former Smoker    Quit date: 07/29/2010  . Smokeless tobacco: Never Used  . Alcohol Use: No    Outpatient Prescriptions Prior to Visit  Medication Sig Dispense Refill  . acetaminophen-codeine (TYLENOL #3) 300-30 MG tablet Take 1 tablet by mouth every 8 (eight) hours as needed for moderate pain. 20 tablet 0  . amLODipine (NORVASC) 10 MG tablet Take 1 tablet (10 mg total) by mouth daily. 30 tablet 11  . aspirin EC 81 MG tablet Take 81 mg by mouth daily.    Marland Kitchen atorvastatin (LIPITOR) 40 MG tablet Take 1 tablet (40 mg total) by mouth daily at 6 PM. 30 tablet 0  . carvedilol (COREG) 6.25 MG tablet Take 1 tablet (6.25 mg total) by mouth 2 (two) times daily with a meal. 60 tablet 3  . Multiple Vitamin (MULITIVITAMIN WITH MINERALS) TABS Take 1 tablet by mouth daily.    . traMADol (ULTRAM) 50 MG tablet TAKE 1 TABLET BY MOUTH EVERY 12 HOURS AS NEEDED FOR MODERATE PAIN 60 tablet 2  . warfarin (COUMADIN)  5 MG tablet Take 1 tablet (5 mg total) by mouth daily. 30 tablet 0  . enoxaparin (LOVENOX) 80 MG/0.8ML injection Inject 0.7 mLs (70 mg total) into the skin every 12 (twelve) hours. (Patient not taking: Reported on 10/14/2015) 3 Syringe 0  . ibuprofen (ADVIL,MOTRIN) 600 MG tablet Take 1 tablet (600 mg total) by mouth every 6 (six) hours as needed for mild pain or moderate pain. (Patient not taking: Reported on 10/14/2015) 10 tablet 0  . tiZANidine (ZANAFLEX) 4 MG tablet TAKE 1 TABLET BY MOUTH EVERY 6 HOURS AS NEEDED FOR MUSCLE SPASMS (Patient not taking: Reported on 10/14/2015) 30 tablet 0   No facility-administered medications prior to visit.    ROS Review of Systems  Constitutional: Negative for fever and chills.  Eyes: Negative for visual disturbance.  Respiratory: Negative for shortness of breath.   Cardiovascular: Negative for chest pain.  Gastrointestinal: Negative for abdominal pain and blood in stool.  Musculoskeletal: Positive for myalgias and back pain. Negative for arthralgias.  Skin: Negative for rash.  Allergic/Immunologic: Negative for immunocompromised state.  Hematological: Negative for adenopathy. Does not bruise/bleed easily.  Psychiatric/Behavioral: Negative for suicidal ideas and dysphoric mood.    Objective:  BP 122/70 mmHg  Pulse 67  Temp(Src) 98.5 F (36.9 C) (Oral)  Ht 5\' 5"  (1.651 m)  Wt 154 lb 9.6 oz (70.126 kg)  BMI 25.73 kg/m2  SpO2 100%  BP/Weight 10/14/2015 10/04/2015 09/30/2015  Systolic BP 123XX123 [id number] -  Diastolic BP 70 65 -  Wt. (Lbs) 154.6 - 158.51  BMI 25.73 - 26.38    Physical Exam  Constitutional: She is oriented to person, place, and time. She appears well-developed and well-nourished. No distress.  HENT:  Head: Normocephalic and atraumatic.  Mouth/Throat: Abnormal dentition.  Cardiovascular: Normal rate, regular rhythm, normal heart sounds and intact distal pulses.   Pulmonary/Chest: Effort normal and breath sounds normal.  Musculoskeletal: She  exhibits no edema or tenderness.  Neurological: She is alert and oriented to person, place, and time.  Skin: Skin is warm and dry. No rash noted.  Psychiatric: She has a normal mood and affect.    Lab Results  Component Value Date   INR 3.7 10/14/2015   INR 2.2 10/12/2015   INR 1.0 10/07/2015    Assessment & Plan:  [name] was seen today for hospitalization follow-up.  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 -     Basic Metabolic Panel -     Culture, blood (single) -     Culture, blood (single) -     CBC  Lumbar spondylosis, unspecified spinal osteoarthritis -     tiZANidine (ZANAFLEX) 4 MG tablet; Take 1 tablet (4 mg total) by mouth every 8 (eight) hours as needed for muscle spasms.  Aortic valve vegetation -     Ambulatory referral to Dentistry  Poor dentition -     Ambulatory referral to Dentistry   No orders of the defined types were placed in this encounter.    Follow-up: Return in about 5 days (around 10/19/2015) for INR check, coumadin clinic .   Boykin Nearing MD

## 2015-10-15 ENCOUNTER — Other Ambulatory Visit: Payer: Self-pay

## 2015-10-15 DIAGNOSIS — I10 Essential (primary) hypertension: Secondary | ICD-10-CM

## 2015-10-15 NOTE — Patient Outreach (Signed)
[facility]) Care Management  10/15/2015  [name] 1950-11-26 [id number]   Telephone Screen  Referral Date: 10/14/15 Referral Source: [facility] Referral Reason: "member expressed concern with being able to afford her medications"   Outreach attempt #1 to patient. Patient reached. Screening completed.  Social: Patient resides in her home. She states that she is independent with ADLs/IADLs. She drives herself to appts. Denies any recent falls. DME in the home include cane and BP monitor.   Conditions: Patient has PMH of HTN,HLD, renal infarct, lumbar spondylosis, aortic valve vegetation and poor dentition. Patient was recently inpatient from 09/30/15-10/04/15. She voices that she is doing okay since discharge. She had MD f/u appt on yesterday.  Medications: Patient states she is taking about four meds. She reports that she was having trouble affording her Lovenox injections. However, she was taken off med on yesterday by MD during office visit and is taking Coumadin. She denies any issues affording other meds.   Appointments: she completed hospital f/u appt with PCP on yesterday.  Consent: Patient gave verbal consent for [facility] services.     Plan: RN CM will notify [facility] administrative assistant of case status. RN CM will send [facility] community referral for TOC case.   Enzo Montgomery, RN,BSN,CCM [place] Management Telephonic Care Management Coordinator Direct Phone: [phone] Toll Free: [phone] Fax: [phone]

## 2015-10-16 LAB — BASIC METABOLIC PANEL
BUN: 8 mg/dL (ref 7–25)
CHLORIDE: 106 mmol/L (ref 98–110)
CO2: 25 mmol/L (ref 20–31)
CREATININE: 0.74 mg/dL (ref 0.50–0.99)
Calcium: 9.4 mg/dL (ref 8.6–10.4)
GLUCOSE: 97 mg/dL (ref 65–99)
POTASSIUM: 3.8 mmol/L (ref 3.5–5.3)
Sodium: 143 mmol/L (ref 135–146)

## 2015-10-16 LAB — CBC
HEMATOCRIT: 40.7 % (ref 35.0–45.0)
Hemoglobin: 13 g/dL (ref 11.7–15.5)
MCH: 31.1 pg (ref 27.0–33.0)
MCHC: 31.9 g/dL — AB (ref 32.0–36.0)
MCV: 97.4 fL (ref 80.0–100.0)
MPV: 11.8 fL (ref 7.5–12.5)
PLATELETS: 249 10*3/uL (ref 140–400)
RBC: 4.18 MIL/uL (ref 3.80–5.10)
RDW: 13.8 % (ref 11.0–15.0)
WBC: 9.6 10*3/uL (ref 3.8–10.8)

## 2015-10-18 ENCOUNTER — Other Ambulatory Visit: Payer: Self-pay

## 2015-10-18 ENCOUNTER — Encounter: Payer: Self-pay | Admitting: Family Medicine

## 2015-10-19 ENCOUNTER — Ambulatory Visit: Payer: PPO | Attending: Family Medicine | Admitting: Pharmacist

## 2015-10-19 DIAGNOSIS — N280 Ischemia and infarction of kidney: Secondary | ICD-10-CM

## 2015-10-19 LAB — POCT INR: INR: 1.9

## 2015-10-19 NOTE — Patient Outreach (Signed)
[place] [facility]) [place] Telephone Outreach for Transition of Care  10/18/2015  [name] 1950/07/14 [id number]  Successful telephonic outreach to patient at home. Provided education regarding confidentiality and verified patient identity.   Patient states that she is doing a lot better since discharge. Currently denies any pain or concerns.  Patient verbalizes understanding of discharge and confirm she received and understands her discharge instructions. She reports she has already followed up with her provider on 10/14/2015 and has been following up for labs for her Coumadin.  Patient lives alone and has transportation to appointments. Patient stated that she does not have money to fill all of her medications. Stated that she has a muscle relaxer (Zanaflex) that is still at the pharmacy because she cannot afford to get it filled yet.   Patient agreeable to a home visit next week.   Plan: Home visit next week. Refer to pharmacy for medication assistance.   Denise R. Elliana Bal, RN, BSN, [place] Management Coordinator [phone]

## 2015-10-20 ENCOUNTER — Telehealth: Payer: Self-pay

## 2015-10-20 ENCOUNTER — Other Ambulatory Visit: Payer: Self-pay | Admitting: Family Medicine

## 2015-10-20 NOTE — Telephone Encounter (Signed)
Contacted pt and went over lab results. Pt is aware of results and doesn't have any questions or concerns

## 2015-10-20 NOTE — Telephone Encounter (Signed)
Rx Requests 

## 2015-10-21 NOTE — Telephone Encounter (Signed)
Attempted to call patient to inquire about clindamycin refill request  Called both #s left VM requesting a call back.  When patient calls please ask if clindamycin is for oral pain? I have refilled the clindamycin She will need to see dentist  She will need f/u appt for any additional refills.

## 2015-10-22 LAB — CULTURE, BLOOD (SINGLE)
ORGANISM ID, BACTERIA: NO GROWTH
Organism ID, Bacteria: NO GROWTH

## 2015-10-25 ENCOUNTER — Telehealth: Payer: Self-pay

## 2015-10-25 NOTE — Telephone Encounter (Signed)
Patient was informed of results 

## 2015-10-26 ENCOUNTER — Other Ambulatory Visit: Payer: Self-pay

## 2015-10-26 ENCOUNTER — Ambulatory Visit: Payer: PPO | Attending: Internal Medicine | Admitting: Pharmacist

## 2015-10-26 DIAGNOSIS — N280 Ischemia and infarction of kidney: Secondary | ICD-10-CM

## 2015-10-26 LAB — POCT INR: INR: 3.8
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
## 2015-10-31 ENCOUNTER — Telehealth: Payer: Self-pay | Admitting: Family Medicine

## 2015-10-31 DIAGNOSIS — M1712 Unilateral primary osteoarthritis, left knee: Secondary | ICD-10-CM

## 2015-10-31 DIAGNOSIS — M545 Low back pain, unspecified: Secondary | ICD-10-CM

## 2015-10-31 DIAGNOSIS — M47816 Spondylosis without myelopathy or radiculopathy, lumbar region: Secondary | ICD-10-CM

## 2015-11-01 ENCOUNTER — Other Ambulatory Visit: Payer: Self-pay | Admitting: Family Medicine

## 2015-11-01 ENCOUNTER — Telehealth: Payer: Self-pay | Admitting: Family Medicine

## 2015-11-01 DIAGNOSIS — M545 Low back pain, unspecified: Secondary | ICD-10-CM

## 2015-11-01 DIAGNOSIS — M47816 Spondylosis without myelopathy or radiculopathy, lumbar region: Secondary | ICD-10-CM

## 2015-11-01 DIAGNOSIS — M1712 Unilateral primary osteoarthritis, left knee: Secondary | ICD-10-CM

## 2015-11-01 NOTE — Telephone Encounter (Signed)
12:30 pm today, patient came to clinic to pick up paper prescription for Tramadol. Before being able to take paper prescription, patient was to read over and sign Controlled Substance contract. After signing contract, patient did not agree to terms in contract which would therefor void and she would be unable to get prscription. Patient also wanted to know why original prescribers name, which was her Orthopedic doctor, name was no on the prescriptions

## 2015-11-01 NOTE — Telephone Encounter (Signed)
Tramadol Rx ready for pick up Patient needs controlled substance contract

## 2015-11-01 NOTE — Telephone Encounter (Signed)
Patient was called and informed of prescription being ready

## 2015-11-02 ENCOUNTER — Encounter: Payer: Self-pay | Admitting: Pharmacist

## 2015-11-02 NOTE — Telephone Encounter (Signed)
12:30 pm today, patient came to clinic to pick up paper prescription for Tramadol. Before being able to take the paper prescription, patient was to read over and sign Controlled Substance contract. After signing contract, patient did not agree to terms in contract which would therefore voids it  and she would be unable to get controlled precriptions. Patient also wanted to know why original prescribers name, which was her Orthopedic doctor, name was not on the prescriptions. I explained to her that whichever doctor your request the medication from, their name will be on the medication. I also told her that if she wants her Orthopedic doctors name on the medication, then you would have to call that off to receive any refills.

## 2015-11-03 ENCOUNTER — Other Ambulatory Visit: Payer: Self-pay | Admitting: Pharmacist

## 2015-11-03 ENCOUNTER — Other Ambulatory Visit: Payer: Self-pay | Admitting: Pulmonary Disease

## 2015-11-03 DIAGNOSIS — N280 Ischemia and infarction of kidney: Secondary | ICD-10-CM

## 2015-11-03 MED ORDER — WARFARIN SODIUM 5 MG PO TABS
ORAL_TABLET | ORAL | 2 refills | Status: DC
Start: 2015-11-03 — End: 2016-01-30

## 2015-11-04 ENCOUNTER — Ambulatory Visit: Payer: PPO | Attending: Internal Medicine | Admitting: Pharmacist

## 2015-11-04 DIAGNOSIS — N280 Ischemia and infarction of kidney: Secondary | ICD-10-CM

## 2015-11-04 LAB — POCT INR: INR: 2.3

## 2015-11-06 ENCOUNTER — Other Ambulatory Visit: Payer: Self-pay | Admitting: Pulmonary Disease

## 2015-11-08 MED ORDER — ATORVASTATIN CALCIUM 40 MG PO TABS: 40.0000 mg | ORAL_TABLET | Freq: Every day | ORAL | 3 refills | Status: DC

## 2015-11-08 NOTE — Telephone Encounter (Signed)
Atorvastatin refilled.  

## 2015-11-08 NOTE — Telephone Encounter (Signed)
Pt. Called requesting a refill on atorvastatin (LIPITOR) 40 MG tablet. Pt. Uses [facility] on [address]. Please f/u

## 2015-11-09 ENCOUNTER — Other Ambulatory Visit: Payer: Self-pay | Admitting: Pharmacist

## 2015-11-09 MED ORDER — ATORVASTATIN CALCIUM 40 MG PO TABS: 40.0000 mg | ORAL_TABLET | Freq: Every day | ORAL | 3 refills | Status: DC

## 2015-11-12 ENCOUNTER — Other Ambulatory Visit: Payer: Self-pay

## 2015-11-15 NOTE — Patient Outreach (Signed)
[facility]) Care Management  11/12/2015  [name] Aug 21, 1950 [id number]  Successful outreach completed with patient. Patient identification verified.   Patient stated that she is doing well. Currently denies any pain.  Followed up regarding anticoagulation therapy. Patient reports that she now goes every 2 weeks to have her levels checked because her levels were good last time. Patient denies any bruising or bleeding. Patient currently denies any needs. Stated that her abdominal pain is better and she has been doing well.  Plan: RNCM will follow up with patient next week and continue to monitor for needs. Will also follow up to determine if patient scheduled an appointment with a dental provider and provide assistance as needed.  [name], RN, BSN, [place] Management Coordinator [phone]

## 2015-11-16 ENCOUNTER — Ambulatory Visit: Payer: PPO | Attending: Internal Medicine | Admitting: Pharmacist

## 2015-11-16 DIAGNOSIS — N280 Ischemia and infarction of kidney: Secondary | ICD-10-CM

## 2015-11-16 LAB — POCT INR: INR: 1.9

## 2015-11-19 ENCOUNTER — Other Ambulatory Visit: Payer: Self-pay

## 2015-11-21 ENCOUNTER — Emergency Department (HOSPITAL_COMMUNITY)
Admission: EM | Admit: 2015-11-21 | Discharge: 2015-11-21 | Disposition: A | Discharge status: Home / Self Care | Payer: PPO | Attending: Emergency Medicine | Admitting: Emergency Medicine

## 2015-11-21 ENCOUNTER — Emergency Department (HOSPITAL_BASED_OUTPATIENT_CLINIC_OR_DEPARTMENT_OTHER): Payer: PPO

## 2015-11-21 ENCOUNTER — Emergency Department (HOSPITAL_COMMUNITY): Payer: PPO

## 2015-11-21 ENCOUNTER — Encounter (HOSPITAL_COMMUNITY): Payer: Self-pay | Admitting: Emergency Medicine

## 2015-11-21 DIAGNOSIS — M545 Low back pain: Secondary | ICD-10-CM | POA: Diagnosis not present

## 2015-11-21 DIAGNOSIS — M549 Dorsalgia, unspecified: Secondary | ICD-10-CM

## 2015-11-21 DIAGNOSIS — I10 Essential (primary) hypertension: Secondary | ICD-10-CM | POA: Diagnosis not present

## 2015-11-21 DIAGNOSIS — Z7901 Long term (current) use of anticoagulants: Secondary | ICD-10-CM | POA: Insufficient documentation

## 2015-11-21 DIAGNOSIS — Z91040 Latex allergy status: Secondary | ICD-10-CM | POA: Insufficient documentation

## 2015-11-21 DIAGNOSIS — M79604 Pain in right leg: Secondary | ICD-10-CM | POA: Diagnosis not present

## 2015-11-21 DIAGNOSIS — M79609 Pain in unspecified limb: Secondary | ICD-10-CM | POA: Diagnosis not present

## 2015-11-21 DIAGNOSIS — M5136 Other intervertebral disc degeneration, lumbar region: Secondary | ICD-10-CM | POA: Diagnosis not present

## 2015-11-21 DIAGNOSIS — Z7982 Long term (current) use of aspirin: Secondary | ICD-10-CM | POA: Diagnosis not present

## 2015-11-21 DIAGNOSIS — Z87891 Personal history of nicotine dependence: Secondary | ICD-10-CM | POA: Diagnosis not present

## 2015-11-21 LAB — BASIC METABOLIC PANEL
ANION GAP: 7 (ref 5–15)
BUN: <5 mg/dL — ABNORMAL LOW (ref 6–20)
CALCIUM: 9.4 mg/dL (ref 8.9–10.3)
CO2: 26 mmol/L (ref 22–32)
CREATININE: 0.68 mg/dL (ref 0.44–1.00)
Chloride: 108 mmol/L (ref 101–111)
GFR calc Af Amer: >60 mL/min (ref >60)
GFR calc non Af Amer: >60 mL/min (ref >60)
GLUCOSE: 94 mg/dL (ref 65–99)
Potassium: 3.4 mmol/L — ABNORMAL LOW (ref 3.5–5.1)
Sodium: 141 mmol/L (ref 135–145)

## 2015-11-21 LAB — CBC WITH DIFFERENTIAL/PLATELET
BASOS PCT: 0 %
Basophils Absolute: 0 10*3/uL (ref 0.0–0.1)
EOS ABS: 0.2 10*3/uL (ref 0.0–0.7)
Eosinophils Relative: 2 %
HCT: 40.9 % (ref 36.0–46.0)
HEMOGLOBIN: 13.1 g/dL (ref 12.0–15.0)
LYMPHS ABS: 2.9 10*3/uL (ref 0.7–4.0)
Lymphocytes Relative: 27 %
MCH: 30.8 pg (ref 26.0–34.0)
MCHC: 32 g/dL (ref 30.0–36.0)
MCV: 96.2 fL (ref 78.0–100.0)
MONO ABS: 0.5 10*3/uL (ref 0.1–1.0)
MONOS PCT: 4 %
Neutro Abs: 7.1 10*3/uL (ref 1.7–7.7)
Neutrophils Relative %: 67 %
PLATELETS: 315 10*3/uL (ref 150–400)
RBC: 4.25 MIL/uL (ref 3.87–5.11)
RDW: 14.2 % (ref 11.5–15.5)
WBC: 10.7 10*3/uL — ABNORMAL HIGH (ref 4.0–10.5)

## 2015-11-21 LAB — PROTIME-INR
INR: 2.78
PROTHROMBIN TIME: 29.9 s — AB (ref 11.4–15.2)

## 2015-11-21 MED ORDER — TRAMADOL HCL 50 MG PO TABS
50.0000 mg | ORAL_TABLET | Freq: Once | ORAL | Status: AC
  Administered 2015-11-21: 50 mg via ORAL
  Filled 2015-11-21: qty 1

## 2015-11-21 MED ORDER — TRAMADOL HCL 50 MG PO TABS: 50.0000 mg | ORAL_TABLET | Freq: Four times a day (QID) | ORAL | 0 refills | Status: DC | PRN

## 2015-11-21 MED ORDER — ACETAMINOPHEN 325 MG PO TABS
650.0000 mg | ORAL_TABLET | Freq: Once | ORAL | Status: AC
  Administered 2015-11-21: 650 mg via ORAL
  Filled 2015-11-21: qty 2

## 2015-11-21 NOTE — Progress Notes (Signed)
VASCULAR LAB PRELIMINARY  PRELIMINARY  PRELIMINARY  PRELIMINARY  Right lower extremity venous duplex completed.    Preliminary report:  There is no DVT or SVT noted in the right lower extremity.   Tu Shimmel, RVT 11/21/2015, 2:49 PM

## 2015-11-21 NOTE — ED Triage Notes (Signed)
Pt. Stated, Im having leg pain in both.  My back is hurting too.  It started Wednesday.

## 2015-11-21 NOTE — ED Notes (Signed)
Patient ambulated from waiting room to treatment room without difficulty.

## 2015-11-21 NOTE — ED Provider Notes (Signed)
[facility] DEPT Provider Note   CSN: [id number] Arrival date & time: 11/21/15  1111     History   Chief Complaint Chief Complaint  Patient presents with  . Leg Pain  . Back Pain    HPI [name] is a 65 y.o. female.  The history is provided by the patient and medical records.    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 Patient reports she has a history of chronic back pain secondary to arthritis and scoliosis. States over the past few days when she attempts to do housework her back feels "tired". She states she does get what she feels are "muscle spasms" as well. States the pain does not radiate anywhere, rather is just an ache in her low back. She has not had any falls or trauma. No fever or chills. No urinary symptoms, specifically no dysuria or hematuria. She does not have any flank or abdominal pain. No nausea or vomiting.  Patient also complains of right leg pain, worse behind the right knee and into the right calf. She states pain is localized here without radiation. States worse with walking and weightbearing. States it feels like a "tightness". States she was told she had a DVT in the past. She reports she has been taking her Coumadin as directed. She had her INR checked about a week ago that was mildly low at 1.9. States her primary care doctor told her to continue her normal regimen without any changes.  She is due for re-check in a few days.    Patient reports she has been taking her home pain meds as directed, states she took the last one today.  Past Medical History:  Diagnosis Date  . Abnormal EKG 2012   hospitalized for T wave inversion in lateral leads with MSK chest pains, normal ECHO, negative trops, no cardiology consult. recent EKG 7/15 stable T wave inversions.   . 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 Arthritis 2000  . Heart murmur   .  Hyperlipidemia   . Hypertension 2010  . Meniscus tear    L KNEE    Patient Active Problem List   Diagnosis Date Noted  . 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 Renal infarct ([place]) 09/30/2015  . Essential hypertension   . HLD (hyperlipidemia)   . Aortic atherosclerosis ([place])   . Right-sided low back pain without sciatica 07/08/2015  . 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 Left leg pain 06/14/2015  . Allergic rhinitis 03/09/2015  . Seasonal allergies 03/09/2015  . Abdominal pain 03/09/2015  . Hypopigmentation 03/09/2015  . Pain in both feet 11/10/2014  . Gingivitis 11/10/2014  . Osteoarthritis of left knee 07/08/2014  . DJD (degenerative joint disease), lumbar 07/08/2014  . Osteoarthritis of right knee 07/08/2014  . Foot swelling 06/11/2014  . Urinary frequency 06/11/2014  . Pain due to onychomycosis of toenail 06/11/2014  . Borborygmi 06/11/2014  . Allergic rhinoconjunctivitis of both eyes 06/11/2014  . Nearsightedness 04/27/2014  . Abnormal EKG   . Hypertension 12/11/2013    Past Surgical History:  Procedure Laterality Date  . ABDOMINAL SURGERY  2010   for bowel blockage  . KNEE ARTHROSCOPY Left 09/25/2013   Procedure: LEFT KNEE ARTHROSCOPY WITH PARTIAL MEDIAL AND LATERAL  MENISCECTOMY/DEBRIDEMENT/MICRO FRACTURE MEDIAL FEMORAL CONDYLE, REMOVAL OF OSTEOCONDRAL FRAGMENT;  Surgeon: Johnn Hai, MD;  Location: [facility] ORS;  Service: Orthopedics;  Laterality: Left;  .  KNEE SURGERY Right 1999  . TEE WITHOUT CARDIOVERSION N/A 10/04/2015   Procedure: TRANSESOPHAGEAL ECHOCARDIOGRAM (TEE);  Surgeon: Sueanne Margarita, MD;  Location: [facility] ENDOSCOPY;  Service: Cardiovascular;  Laterality: N/A;  . TUBAL LIGATION  1979    OB History    No data available       Home Medications    Prior to Admission medications   Medication Sig Start Date End Date Taking? Authorizing Provider  acetaminophen-codeine (TYLENOL #3) 300-30 MG tablet Take 1 tablet by mouth every 8 (eight)  hours as needed for moderate pain. 10/12/15   Josalyn Funches, MD  amLODipine (NORVASC) 10 MG tablet Take 1 tablet (10 mg total) by mouth daily. 06/14/15   Boykin Nearing, MD  aspirin EC 81 MG tablet Take 81 mg by mouth daily.    Historical Provider, MD  atorvastatin (LIPITOR) 40 MG tablet Take 1 tablet (40 mg total) by mouth daily at 6 PM. 11/09/15   Boykin Nearing, MD  carvedilol (COREG) 6.25 MG tablet Take 1 tablet (6.25 mg total) by mouth 2 (two) times daily with a meal. 06/14/15   Josalyn Funches, MD  clindamycin (CLEOCIN) 300 MG capsule TAKE 1 CAPSULE BY MOUTH EVERY 6 HOURS 10/21/15   Josalyn Funches, MD  Multiple Vitamin (MULITIVITAMIN WITH MINERALS) TABS Take 1 tablet by mouth daily.    Historical Provider, MD  tiZANidine (ZANAFLEX) 4 MG tablet Take 1 tablet (4 mg total) by mouth every 8 (eight) hours as needed for muscle spasms. 10/14/15   Josalyn Funches, MD  traMADol (ULTRAM) 50 MG tablet TAKE 1 TABLET BY MOUTH EVERY 12 HOURS AS NEEDED FOR MODERATE PAIN 11/01/15   Boykin Nearing, MD  warfarin (COUMADIN) 5 MG tablet Take as directed by [facility] Clinic 11/03/15   Boykin Nearing, MD    Family History Family History  Problem Relation Age of Onset  . Hypertension Mother   . Heart disease Mother     has a pacemaker   . Heart failure Father   . Hypertension Father   . Cancer Neg Hx   . Colon cancer Neg Hx     Social History Social History  Substance Use Topics  . Smoking status: Former Smoker    Quit date: 07/29/2010  . Smokeless tobacco: Never Used  . Alcohol use No     Allergies   Shrimp [shellfish allergy]; Latex; and Penicillins   Review of Systems Review of Systems  Musculoskeletal: Positive for arthralgias (leg pain) and back pain.  All other systems reviewed and are negative.    Physical Exam Updated Vital Signs BP 142/66   Pulse 69   Temp 98.5 F (36.9 C) (Oral)   Resp 18   Ht 5\' 5"  (1.651 m)   Wt 68.9 kg   SpO2 100%   BMI 25.29 kg/m   Physical Exam    Constitutional: She is oriented to person, place, and time. She appears well-developed and well-nourished.  HENT:  Head: Normocephalic and atraumatic.  Mouth/Throat: Oropharynx is clear and moist.  Eyes: Conjunctivae and EOM are normal. Pupils are equal, round, and reactive to light.  Neck: Normal range of motion.  Cardiovascular: Normal rate, regular rhythm and normal heart sounds.   Pulmonary/Chest: Effort normal and breath sounds normal.  Abdominal: Soft. Bowel sounds are normal.  Musculoskeletal: Normal range of motion.  Tenderness of the right lumbar musculature; no midline step-off or deformity, full range of motion maintained without any difficulty, able to sit up and back in bed multiple times during exam  without noted pain; - SLR bilaterally; normal gait; normal strength/sensation of both legs  Right leg overall normal in appearance; endorses pain behind right knee and into proximal calf; there is no noted swelling or overlying skin changes; DP pulse intact Full range of motion of the right knee, no swelling or effusion  Neurological: She is alert and oriented to person, place, and time.  Skin: Skin is warm and dry.  Psychiatric: She has a normal mood and affect.  Nursing note and vitals reviewed.    ED Treatments / Results  Labs (all labs ordered are listed, but only abnormal results are displayed) Labs Reviewed  CBC WITH DIFFERENTIAL/PLATELET - Abnormal; Notable for the following:       Result Value   WBC 10.7 (*)    All other components within normal limits  PROTIME-INR - Abnormal; Notable for the following:    Prothrombin Time 29.9 (*)    All other components within normal limits  BASIC METABOLIC PANEL - Abnormal; Notable for the following:    Potassium 3.4 (*)    BUN <5 (*)    All other components within normal limits    EKG  EKG Interpretation None       Radiology Dg Lumbar Spine Complete  Result Date: 11/21/2015 CLINICAL DATA:  Lower back pain that  radiates down right leg for the past 2 days. EXAM: LUMBAR SPINE - COMPLETE 4+ VIEW COMPARISON:  08/08/2015 FINDINGS: Convex rightward lumbar scoliosis again noted, stable. Bones are diffusely demineralized. Loss of intervertebral disc height with endplate degeneration again noted at L3-4 L4-5. SI joints are unremarkable. Coarse calcification over the central pelvis likely related to uterine fibroid. Bones are diffusely demineralized. IMPRESSION: Stable exam.  Scoliosis with lower lumbar degenerative disc disease. Electronically Signed   By: Misty Stanley M.D.   On: 11/21/2015 14:41   VASCULAR LAB PRELIMINARY  PRELIMINARY  PRELIMINARY  PRELIMINARY  Right lower extremity venous duplex completed.    Preliminary report:  There is no DVT or SVT noted in the right lower extremity.   KANADY, CANDACE, RVT 11/21/2015, 2:49 PM   Procedures Procedures (including critical care time)  Medications Ordered in ED Medications - No data to display   Initial Impression / Assessment and Plan / ED Course  I have reviewed the triage vital signs and the nursing notes.  Pertinent labs & imaging results that were available during my care of the patient were reviewed by me and considered in my medical decision making (see chart for details).  Clinical Course   65 year old female here with back and right leg pain. She has hx of lumbar radiculopathy, however her symptoms today seem to be 2 separate issues based on her exam findings. Pack pain is described as a "ache", worse with exertional activities such as housework. She does not have any focal neurologic deficits here suggest cauda equina. She is afebrile and nontoxic, do not clinically suspect epidural abscess or other emergent infectious pathology. She does have history of arthritis and scoliosis. Denies new injury or fall. She also ran out of her pain medication today. Will obtain screening x-ray.  Patient also with right leg pain. This is localized behind  the right knee and his proximal calf. Right knee is atraumatic in appearance, no swelling or bony deformity. No overlying erythema. Do not suspect septic joint or acute fracture. She reports history of DVT and is on Coumadin. Her level one week ago was subtherapeutic at 1.9. Will recheck labs and repeat INR today.  Also obtain venous duplex.  X-ray with continued DDD, no acute findings.  Venous duplex negative for DVT.  Her lab work is reassuring. Pain improved here with her usual dose of tramadol. Feel she is stable for discharge. She will follow-up with her primary care doctor.  Discussed plan with patient, she acknowledged understanding and agreed with plan of care.  Return precautions given for new or worsening symptoms.  Final Clinical Impressions(s) / ED Diagnoses   Final diagnoses:  Back pain, unspecified location  Right leg pain    New Prescriptions New Prescriptions   TRAMADOL (ULTRAM) 50 MG TABLET    Take 1 tablet (50 mg total) by mouth every 6 (six) hours as needed.     Larene Pickett, PA-C 11/21/15 [place], MD 11/21/15 [phone]

## 2015-11-21 NOTE — Discharge Instructions (Signed)
Take the prescribed medication as directed. °Follow-up with your primary care doctor. °Return to the ED for new or worsening symptoms. °

## 2015-11-21 NOTE — ED Provider Notes (Signed)
Complains of low back pain rating to right foot for the past week. She's had intermittent similar pain for one year. Pain is worse with walking or changing positions improved with remaining still. No other associated symptoms. On exam no distress abdomen soft nontender. Back without point tenderness or flank tenderness. All 4 extremities without redness swelling or tenderness neurovascularly intact. Gait normal   Orlie Dakin, MD 11/21/15 1529

## 2015-11-22 ENCOUNTER — Telehealth: Payer: Self-pay | Admitting: Family Medicine

## 2015-11-22 ENCOUNTER — Other Ambulatory Visit: Payer: Self-pay

## 2015-11-22 ENCOUNTER — Other Ambulatory Visit: Payer: Self-pay | Admitting: Family Medicine

## 2015-11-22 DIAGNOSIS — I10 Essential (primary) hypertension: Secondary | ICD-10-CM

## 2015-11-22 NOTE — Telephone Encounter (Signed)
Pt called the office to place a request for blood pressure med refills on carvedilol (COREG) 6.25 MG tablet and amLODipine (NORVASC) 10 MG tablet. Needs to be called in to [facility] on [address]. Please follow up.   Thank you

## 2015-11-22 NOTE — Telephone Encounter (Signed)
Will route to PCP 

## 2015-11-22 NOTE — Patient Outreach (Signed)
[facility]) Care Management  11/19/2015  [name] [birth year]  [id number]  Successful outreach completed with patient. Patient identification verified.  Patient stated that she is doing well. Denies any pain at present. Denies any signs and symptoms of bleeding. Patient reports she continues to follow up for her blood draws for her Coumadin.  Patient stated that she has not yet made her dental appointment. Stated that she just has not got around to it. RNCM offered assistance with scheduling and patient declined, stating that she would do it next week.  Patient continues to be able to verbalize understanding of signs/symptoms to watch for with Coumadin therapy. Currently has no other concerns. RNCM will outreach patient later this month for additional support and education.

## 2015-11-23 ENCOUNTER — Ambulatory Visit: Payer: PPO | Attending: Internal Medicine | Admitting: Pharmacist

## 2015-11-23 DIAGNOSIS — N280 Ischemia and infarction of kidney: Secondary | ICD-10-CM

## 2015-11-23 LAB — POCT INR: INR: 2.6

## 2015-11-25 MED ORDER — CARVEDILOL 6.25 MG PO TABS: 6.2500 mg | ORAL_TABLET | Freq: Two times a day (BID) | ORAL | 3 refills | Status: DC

## 2015-11-25 MED ORDER — AMLODIPINE BESYLATE 10 MG PO TABS: 10.0000 mg | ORAL_TABLET | Freq: Every day | ORAL | 11 refills | Status: DC

## 2015-11-26 NOTE — Telephone Encounter (Signed)
This encounter was created in error - please disregard.

## 2015-12-07 ENCOUNTER — Encounter: Payer: Self-pay | Admitting: Pharmacist

## 2015-12-07 ENCOUNTER — Ambulatory Visit: Payer: PPO | Attending: Family Medicine | Admitting: Pharmacist

## 2015-12-07 DIAGNOSIS — N280 Ischemia and infarction of kidney: Secondary | ICD-10-CM

## 2015-12-07 LAB — POCT INR: INR: 2.1

## 2015-12-08 ENCOUNTER — Other Ambulatory Visit: Payer: Self-pay | Admitting: Family Medicine

## 2015-12-08 DIAGNOSIS — Z1231 Encounter for screening mammogram for malignant neoplasm of breast: Secondary | ICD-10-CM

## 2015-12-20 ENCOUNTER — Telehealth: Payer: Self-pay | Admitting: Family Medicine

## 2015-12-20 NOTE — Telephone Encounter (Signed)
Please call patient Tramadol ready for pick up

## 2015-12-22 ENCOUNTER — Other Ambulatory Visit: Payer: Self-pay | Admitting: Family Medicine

## 2015-12-22 ENCOUNTER — Telehealth: Payer: Self-pay | Admitting: Family Medicine

## 2015-12-22 DIAGNOSIS — I10 Essential (primary) hypertension: Secondary | ICD-10-CM

## 2015-12-22 NOTE — Telephone Encounter (Signed)
Patient walked in and stated she was told to PCP to walk in at anytime to be seen if swelling of her feet occur.  Patient walked in asking to be seen, was offered an appointment for 9/28 the next day.  Patient became upset and stated she will go to the ED

## 2015-12-23 NOTE — Telephone Encounter (Signed)
Pt. Called stating that her ankle is swollen and that her arm is bruised.  Pt. Would like to speak with the nurse please f/u with pt.

## 2015-12-25 ENCOUNTER — Other Ambulatory Visit: Payer: Self-pay

## 2015-12-25 NOTE — Patient Outreach (Signed)
[facility]) Care Management  12/25/15  [name] 03-05-1951 [id number]  Attempted to reach patient without success. Unable to leave a message.  [place] R. Shadoe Bethel, RN, BSN, [place] Management Coordinator [phone]

## 2015-12-27 NOTE — Telephone Encounter (Signed)
Pt was called on 10/2 no answer or VM pick up.

## 2015-12-28 ENCOUNTER — Encounter: Payer: Self-pay | Admitting: Pharmacist

## 2015-12-29 ENCOUNTER — Ambulatory Visit: Payer: PPO | Attending: Internal Medicine | Admitting: Pharmacist

## 2015-12-29 DIAGNOSIS — N280 Ischemia and infarction of kidney: Secondary | ICD-10-CM

## 2015-12-29 LAB — POCT INR: INR: 1.9

## 2016-01-07 ENCOUNTER — Other Ambulatory Visit: Payer: Self-pay

## 2016-01-07 NOTE — Patient Outreach (Signed)
[facility]) Care Management  01/07/16  [name] 04/06/1950  [id number]  Attempted to reach patient without success. Phone rang multiple times with no answer and no option to leave a voicemail.  [place] R. Victorhugo Preis, RN, BSN, [place] Management Coordinator [phone]

## 2016-01-18 ENCOUNTER — Ambulatory Visit: Payer: PPO | Attending: Internal Medicine | Admitting: Pharmacist

## 2016-01-18 DIAGNOSIS — N280 Ischemia and infarction of kidney: Secondary | ICD-10-CM

## 2016-01-18 LAB — POCT INR: INR: 2.3

## 2016-01-30 ENCOUNTER — Other Ambulatory Visit: Payer: Self-pay | Admitting: Family Medicine

## 2016-01-30 DIAGNOSIS — N280 Ischemia and infarction of kidney: Secondary | ICD-10-CM

## 2016-02-09 ENCOUNTER — Ambulatory Visit: Payer: PPO | Attending: Internal Medicine | Admitting: Pharmacist

## 2016-02-09 DIAGNOSIS — N280 Ischemia and infarction of kidney: Secondary | ICD-10-CM

## 2016-02-09 LAB — POCT INR: INR: 2

## 2016-02-15 ENCOUNTER — Encounter (HOSPITAL_COMMUNITY): Payer: Self-pay | Admitting: *Deleted

## 2016-02-15 DIAGNOSIS — Z91040 Latex allergy status: Secondary | ICD-10-CM | POA: Diagnosis not present

## 2016-02-15 DIAGNOSIS — Z79899 Other long term (current) drug therapy: Secondary | ICD-10-CM | POA: Insufficient documentation

## 2016-02-15 DIAGNOSIS — I10 Essential (primary) hypertension: Secondary | ICD-10-CM | POA: Insufficient documentation

## 2016-02-15 DIAGNOSIS — Z87891 Personal history of nicotine dependence: Secondary | ICD-10-CM | POA: Diagnosis not present

## 2016-02-15 DIAGNOSIS — K529 Noninfective gastroenteritis and colitis, unspecified: Secondary | ICD-10-CM | POA: Insufficient documentation

## 2016-02-15 DIAGNOSIS — Z7901 Long term (current) use of anticoagulants: Secondary | ICD-10-CM | POA: Diagnosis not present

## 2016-02-15 DIAGNOSIS — Z7982 Long term (current) use of aspirin: Secondary | ICD-10-CM | POA: Diagnosis not present

## 2016-02-15 DIAGNOSIS — R1030 Lower abdominal pain, unspecified: Secondary | ICD-10-CM | POA: Diagnosis not present

## 2016-02-15 DIAGNOSIS — R109 Unspecified abdominal pain: Secondary | ICD-10-CM | POA: Diagnosis not present

## 2016-02-15 LAB — CBC
HCT: 38.6 % (ref 36.0–46.0)
HEMOGLOBIN: 12.9 g/dL (ref 12.0–15.0)
MCH: 31.5 pg (ref 26.0–34.0)
MCHC: 33.4 g/dL (ref 30.0–36.0)
MCV: 94.4 fL (ref 78.0–100.0)
Platelets: 296 10*3/uL (ref 150–400)
RBC: 4.09 MIL/uL (ref 3.87–5.11)
RDW: 13.5 % (ref 11.5–15.5)
WBC: 12.8 10*3/uL — ABNORMAL HIGH (ref 4.0–10.5)

## 2016-02-15 LAB — URINALYSIS, ROUTINE W REFLEX MICROSCOPIC
BILIRUBIN URINE: NEGATIVE
GLUCOSE, UA: NEGATIVE mg/dL
HGB URINE DIPSTICK: NEGATIVE
Ketones, ur: NEGATIVE mg/dL
Leukocytes, UA: NEGATIVE
Nitrite: NEGATIVE
PH: 5 (ref 5.0–8.0)
Protein, ur: NEGATIVE mg/dL
SPECIFIC GRAVITY, URINE: 1.015 (ref 1.005–1.030)

## 2016-02-15 LAB — COMPREHENSIVE METABOLIC PANEL
ALBUMIN: 3.9 g/dL (ref 3.5–5.0)
ALK PHOS: 116 U/L (ref 38–126)
ALT: 19 U/L (ref 14–54)
ANION GAP: 7 (ref 5–15)
AST: 22 U/L (ref 15–41)
BUN: 9 mg/dL (ref 6–20)
CALCIUM: 9.1 mg/dL (ref 8.9–10.3)
CO2: 26 mmol/L (ref 22–32)
CREATININE: 0.82 mg/dL (ref 0.44–1.00)
Chloride: 106 mmol/L (ref 101–111)
GFR calc Af Amer: >60 mL/min (ref >60)
GFR calc non Af Amer: >60 mL/min (ref >60)
GLUCOSE: 100 mg/dL — AB (ref 65–99)
Potassium: 3 mmol/L — ABNORMAL LOW (ref 3.5–5.1)
SODIUM: 139 mmol/L (ref 135–145)
Total Bilirubin: 0.4 mg/dL (ref 0.3–1.2)
Total Protein: 7.1 g/dL (ref 6.5–8.1)

## 2016-02-15 LAB — LIPASE, BLOOD: Lipase: 30 U/L (ref 11–51)

## 2016-02-15 NOTE — ED Triage Notes (Signed)
The pt ias c/o lower abd pain since she had surgery 2011  This episode has started 3 days ago and she hears noises in her ears.  C/o nv with diarrhea also.

## 2016-02-16 ENCOUNTER — Emergency Department (HOSPITAL_COMMUNITY): Payer: PPO

## 2016-02-16 ENCOUNTER — Emergency Department (HOSPITAL_COMMUNITY)
Admission: EM | Admit: 2016-02-16 | Discharge: 2016-02-16 | Disposition: A | Discharge status: Home / Self Care | Payer: PPO | Attending: Emergency Medicine | Admitting: Emergency Medicine

## 2016-02-16 DIAGNOSIS — R109 Unspecified abdominal pain: Secondary | ICD-10-CM | POA: Diagnosis not present

## 2016-02-16 DIAGNOSIS — K529 Noninfective gastroenteritis and colitis, unspecified: Secondary | ICD-10-CM

## 2016-02-16 LAB — GC/CHLAMYDIA PROBE AMP (~~LOC~~) NOT AT ARMC
Chlamydia: NEGATIVE
NEISSERIA GONORRHEA: NEGATIVE

## 2016-02-16 LAB — URINALYSIS, ROUTINE W REFLEX MICROSCOPIC
Bilirubin Urine: NEGATIVE
Glucose, UA: NEGATIVE mg/dL
Hgb urine dipstick: NEGATIVE
Ketones, ur: NEGATIVE mg/dL
LEUKOCYTES UA: NEGATIVE
NITRITE: NEGATIVE
PH: 6 (ref 5.0–8.0)
Protein, ur: NEGATIVE mg/dL
SPECIFIC GRAVITY, URINE: 1.034 — AB (ref 1.005–1.030)

## 2016-02-16 LAB — POC OCCULT BLOOD, ED: Fecal Occult Bld: NEGATIVE

## 2016-02-16 LAB — WET PREP, GENITAL
CLUE CELLS WET PREP: NONE SEEN
Sperm: NONE SEEN
TRICH WET PREP: NONE SEEN
YEAST WET PREP: NONE SEEN

## 2016-02-16 LAB — LIPASE, BLOOD: Lipase: 31 U/L (ref 11–51)

## 2016-02-16 LAB — PROTIME-INR
INR: 1.59
PROTHROMBIN TIME: 19.1 s — AB (ref 11.4–15.2)

## 2016-02-16 LAB — MAGNESIUM: Magnesium: 1.8 mg/dL (ref 1.7–2.4)

## 2016-02-16 MED ORDER — IOPAMIDOL (ISOVUE-300) INJECTION 61%
INTRAVENOUS | Status: AC
  Administered 2016-02-16: 100 mL
  Filled 2016-02-16: qty 100

## 2016-02-16 MED ORDER — TRAMADOL HCL 50 MG PO TABS: 50.0000 mg | ORAL_TABLET | Freq: Four times a day (QID) | ORAL | 0 refills | Status: DC | PRN

## 2016-02-16 MED ORDER — POTASSIUM CHLORIDE CRYS ER 20 MEQ PO TBCR: 20.0000 meq | EXTENDED_RELEASE_TABLET | Freq: Every day | ORAL | 0 refills | Status: DC

## 2016-02-16 MED ORDER — POTASSIUM CHLORIDE CRYS ER 20 MEQ PO TBCR: 40.0000 meq | EXTENDED_RELEASE_TABLET | Freq: Once | ORAL | Status: DC

## 2016-02-16 MED ORDER — ONDANSETRON HCL 4 MG/2ML IJ SOLN
4.0000 mg | Freq: Once | INTRAMUSCULAR | Status: AC
  Administered 2016-02-16: 4 mg via INTRAVENOUS
  Filled 2016-02-16: qty 2

## 2016-02-16 MED ORDER — MORPHINE SULFATE (PF) 4 MG/ML IV SOLN
4.0000 mg | Freq: Once | INTRAVENOUS | Status: AC
  Administered 2016-02-16: 4 mg via INTRAVENOUS
  Filled 2016-02-16: qty 1

## 2016-02-16 NOTE — Discharge Instructions (Signed)
For pain control you may take up to 800mg  of Motrin (also known as ibuprofen). That is usually 4 over the counter pills,  3 times a day. Take with food to minimize stomach irritation   You can also take  tylenol (acetaminophen) 975mg  (this is 3 over the counter pills) four times a day. Do not drink alcohol or combine with other medications that have acetaminophen as an ingredient (Read the labels!).    Please follow with your primary care doctor in the next 2 days for a check-up. They must obtain records for further management.   Do not hesitate to return to the Emergency Department for any new, worsening or concerning symptoms.

## 2016-02-16 NOTE — ED Notes (Signed)
The pt has been to the desk 3 or 4times c/o the wait and how much pain she is in as she is eating a bag of fritos  She thinks that people are going before her  Not true

## 2016-02-16 NOTE — ED Provider Notes (Signed)
[facility] DEPT Provider Note   CSN: [id number] Arrival date & time: 02/15/16  2053     History   Chief Complaint Chief Complaint  Patient presents with  . Abdominal Pain    HPI   Blood pressure 140/75, pulse 76, temperature 98 F (36.7 C), temperature source Oral, resp. rate 18, height 5\' 5"  (1.651 m), weight 71.8 kg, SpO2 100 %.  [name] is a 65 y.o. female complaining of severe lower abdominal pain worsening over the last 3 days she is eating well and states that she gets hungry quickly after eating approximately 1 hours is atypical for her, she is passing stool normally, passing gas normally. She states that the pain is in the lower abdomen however this is consistent she had a renal vein thrombosis. She's taking Coumadin as directed. She states that she had single episode of melanotic stool but this has been. Note that this contradicts triage notes which states that she has had nausea and vomiting.   Past Medical History:  Diagnosis Date  . Abnormal EKG 2012   hospitalized for T wave inversion in lateral leads with MSK chest pains, normal ECHO, negative trops, no cardiology consult. recent EKG 7/15 stable T wave inversions.   . 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 Arthritis 2000  . Heart murmur   . Hyperlipidemia   . Hypertension 2010  . Meniscus tear    L KNEE    Patient Active Problem List   Diagnosis Date Noted  . 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 Essential hypertension   . HLD (hyperlipidemia)   . Aortic atherosclerosis ([place])   . Right-sided low back pain without sciatica 07/08/2015  . 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 Left leg pain 06/14/2015  . Allergic rhinitis 03/09/2015  . Seasonal allergies 03/09/2015  . Abdominal pain 03/09/2015  . Hypopigmentation 03/09/2015  . Pain in both feet 11/10/2014  . Gingivitis 11/10/2014  . Osteoarthritis of left knee 07/08/2014  . DJD (degenerative  joint disease), lumbar 07/08/2014  . Osteoarthritis of right knee 07/08/2014  . Foot swelling 06/11/2014  . Urinary frequency 06/11/2014  . Pain due to onychomycosis of toenail 06/11/2014  . Borborygmi 06/11/2014  . Allergic rhinoconjunctivitis of both eyes 06/11/2014  . Nearsightedness 04/27/2014  . Abnormal EKG   . Hypertension 12/11/2013    Past Surgical History:  Procedure Laterality Date  . ABDOMINAL SURGERY  2010   for bowel blockage  . KNEE ARTHROSCOPY Left 09/25/2013   Procedure: LEFT KNEE ARTHROSCOPY WITH PARTIAL MEDIAL AND LATERAL  MENISCECTOMY/DEBRIDEMENT/MICRO FRACTURE MEDIAL FEMORAL CONDYLE, REMOVAL OF OSTEOCONDRAL FRAGMENT;  Surgeon: Johnn Hai, MD;  Location: [facility];  Service: Orthopedics;  Laterality: Left;  . KNEE SURGERY Right 1999  . TEE WITHOUT CARDIOVERSION N/A 10/04/2015   Procedure: TRANSESOPHAGEAL ECHOCARDIOGRAM (TEE);  Surgeon: Sueanne Margarita, MD;  Location: [facility] ENDOSCOPY;  Service: Cardiovascular;  Laterality: N/A;  . TUBAL LIGATION  1979    OB History    No data available       Home Medications    Prior to Admission medications   Medication Sig Start Date End Date Taking? Authorizing Provider  amLODipine (NORVASC) 10 MG tablet Take 1 tablet (10 mg total) by mouth daily. 11/25/15  Yes Boykin Nearing, MD  aspirin EC 81 MG tablet Take 81 mg by mouth daily.   Yes Historical Provider, MD  atorvastatin (LIPITOR) 40 MG tablet Take 1 tablet (40 mg total) by  mouth daily at 6 PM. 11/09/15  Yes Boykin Nearing, MD  carvedilol (COREG) 6.25 MG tablet Take 1 tablet (6.25 mg total) by mouth 2 (two) times daily with a meal. 11/25/15  Yes Josalyn Funches, MD  Multiple Vitamin (MULITIVITAMIN WITH MINERALS) TABS Take 1 tablet by mouth daily.   Yes Historical Provider, MD  warfarin (COUMADIN) 5 MG tablet Take 1 tablet (5 mg total) by mouth daily. Take as directed by [facility] 01/31/16  Yes Josalyn Funches, MD  acetaminophen-codeine (TYLENOL #3) 300-30 MG tablet Take  1 tablet by mouth every 8 (eight) hours as needed for moderate pain. Patient not taking: Reported on 02/16/2016 10/12/15   Boykin Nearing, MD  clindamycin (CLEOCIN) 300 MG capsule TAKE 1 CAPSULE BY MOUTH EVERY 6 HOURS Patient not taking: Reported on 02/16/2016 10/21/15   Boykin Nearing, MD  potassium chloride SA (K-DUR,KLOR-CON) 20 MEQ tablet Take 1 tablet (20 mEq total) by mouth daily. 02/16/16   Anwita Mencer, PA-C  tiZANidine (ZANAFLEX) 4 MG tablet Take 1 tablet (4 mg total) by mouth every 8 (eight) hours as needed for muscle spasms. Patient not taking: Reported on 02/16/2016 10/14/15   Boykin Nearing, MD  traMADol (ULTRAM) 50 MG tablet Take 1 tablet (50 mg total) by mouth every 6 (six) hours as needed. 02/16/16   Elmyra Ricks Emeric Novinger, PA-C    Family History Family History  Problem Relation Age of Onset  . Hypertension Mother   . Heart disease Mother     has a pacemaker   . Heart failure Father   . Hypertension Father   . Cancer Neg Hx   . Colon cancer Neg Hx     Social History Social History  Substance Use Topics  . Smoking status: Former Smoker    Quit date: 07/29/2010  . Smokeless tobacco: Never Used  . Alcohol use No     Allergies   Shrimp [shellfish allergy]; Latex; and Penicillins   Review of Systems Review of Systems  10 systems reviewed and found to be negative, except as noted in the HPI.   Physical Exam Updated Vital Signs BP 120/65   Pulse 73   Temp 98 F (36.7 C) (Oral)   Resp 18   Ht 5\' 5"  (1.651 m)   Wt 71.8 kg   SpO2 98%   BMI 26.34 kg/m   Physical Exam  Constitutional: She is oriented to person, place, and time. She appears well-developed and well-nourished. No distress.  HENT:  Head: Normocephalic and atraumatic.  Mouth/Throat: Oropharynx is clear and moist.  Eyes: Conjunctivae and EOM are normal. Pupils are equal, round, and reactive to light.  Neck: Normal range of motion.  Cardiovascular: Normal rate, regular rhythm and intact distal  pulses.   Pulmonary/Chest: Effort normal and breath sounds normal.  Abdominal: Soft. She exhibits no distension and no mass. There is no tenderness. There is no rebound and no guarding. No hernia.  Remote surgical scars to the abdomen. Normoactive bowel sounds, mild tenderness to palpation of the epigastrium with no guarding or rebound.  Genitourinary:  Genitourinary Comments: Pelvic exam a chaperoned by technician: No rashes or lesions, no cervical motion or adnexal tenderness.  Musculoskeletal: Normal range of motion.  Neurological: She is alert and oriented to person, place, and time.  Skin: Capillary refill takes less than 2 seconds. She is not diaphoretic.  Psychiatric: She has a normal mood and affect.  Nursing note and vitals reviewed.    ED Treatments / Results  Labs (all labs ordered are  listed, but only abnormal results are displayed) Labs Reviewed  WET PREP, GENITAL - Abnormal; Notable for the following:       Result Value   WBC, Wet Prep HPF POC MODERATE (*)    All other components within normal limits  COMPREHENSIVE METABOLIC PANEL - Abnormal; Notable for the following:    Potassium 3.0 (*)    Glucose, Bld 100 (*)    All other components within normal limits  CBC - Abnormal; Notable for the following:    WBC 12.8 (*)    All other components within normal limits  PROTIME-INR - Abnormal; Notable for the following:    Prothrombin Time 19.1 (*)    All other components within normal limits  URINALYSIS, ROUTINE W REFLEX MICROSCOPIC (NOT AT [facility]) - Abnormal; Notable for the following:    Specific Gravity, Urine 1.034 (*)    All other components within normal limits  LIPASE, BLOOD  URINALYSIS, ROUTINE W REFLEX MICROSCOPIC (NOT AT [facility])  LIPASE, BLOOD  MAGNESIUM  POC OCCULT BLOOD, ED  GC/CHLAMYDIA PROBE AMP ([place]) NOT AT [facility]    EKG  EKG Interpretation None       Radiology Ct Abdomen Pelvis W Contrast  Result Date: 02/16/2016 CLINICAL DATA:  Pain.  EXAM: CT ABDOMEN AND PELVIS WITH CONTRAST TECHNIQUE: Multidetector CT imaging of the abdomen and pelvis was performed using the standard protocol following bolus administration of intravenous contrast. CONTRAST:  100 ml ISOVUE-300 IOPAMIDOL (ISOVUE-300) INJECTION 61% COMPARISON:  MRI abdomen and pelvis 09/30/2015. CT abdomen and pelvis 09/30/2015. FINDINGS: Lower chest: Dependent atelectasis in the lung bases. Hepatobiliary: No focal liver lesions. Gallbladder appears normal. Mild intrahepatic bile duct dilatation, similar to prior study. No common duct stone or mass visualized. Pancreas: Unremarkable. No pancreatic ductal dilatation or surrounding inflammatory changes. Spleen: Normal in size without focal abnormality. Adrenals/Urinary Tract: Adrenal glands are unremarkable. Kidneys are normal, without renal calculi, focal lesion, or hydronephrosis. Bladder is unremarkable. Stomach/Bowel: Stomach is mostly decompressed. There is mild wall thickening in the jejunal loops possibly indicating enteritis. No evidence of bowel obstruction. Colon is mostly decompressed. No colonic wall thickening or inflammation. Appendix is normal. Vascular/Lymphatic: Aortic atherosclerosis. No enlarged abdominal or pelvic lymph nodes. Reproductive: Calcified uterine nodules consistent with fibroids. No abnormal adnexal masses. Other: No abdominal wall hernia or abnormality. No abdominopelvic ascites. Musculoskeletal: Degenerative changes in the spine. No destructive bone lesions. IMPRESSION: Mild wall thickening is suggested in the proximal small bowel possibly indicating enteritis or nonspecific edema. No evidence of bowel obstruction. Mild intrahepatic bile duct dilatation is unchanged since prior study. This is likely not clinically significant. Electronically Signed   By: Lucienne Capers M.D.   On: 02/16/2016 02:24    Procedures Procedures (including critical care time)  Medications Ordered in ED Medications  potassium  chloride SA (K-DUR,KLOR-CON) CR tablet 40 mEq (not administered)  morphine 4 MG/ML injection 4 mg (4 mg Intravenous Given 02/16/16 0135)  ondansetron (ZOFRAN) injection 4 mg (4 mg Intravenous Given 02/16/16 0135)  iopamidol (ISOVUE-300) 61 % injection (100 mLs  Contrast Given 02/16/16 0151)     Initial Impression / Assessment and Plan / ED Course  I have reviewed the triage vital signs and the nursing notes.  Pertinent labs & imaging results that were available during my care of the patient were reviewed by me and considered in my medical decision making (see chart for details).  Clinical Course     Vitals:   02/16/16 [id number] 02/16/16 [id number] 02/16/16 0315 02/16/16 0345  BP: 152/82  135/70 120/65  Pulse:  77 76 73  Resp:      Temp:      TempSrc:      SpO2:  100% 98% 98%  Weight:      Height:        Medications  potassium chloride SA (K-DUR,KLOR-CON) CR tablet 40 mEq (not administered)  morphine 4 MG/ML injection 4 mg (4 mg Intravenous Given 02/16/16 0135)  ondansetron (ZOFRAN) injection 4 mg (4 mg Intravenous Given 02/16/16 0135)  iopamidol (ISOVUE-300) 61 % injection (100 mLs  Contrast Given 02/16/16 0151)    [name] is 65 y.o. female presenting with Abdominal pain and discomfort, eating and drinking well. Patient states that she has suprapubic abdominal pain over on my exam, more epigastric. Exam unremarkable, CBC with a segment of proximal small bowel wall thickening, no evidence of small bowel obstruction. Patient tolerating by mouth's, mild hypokalemia.   Evaluation does not show pathology that would require ongoing emergent intervention or inpatient treatment. Pt is hemodynamically stable and mentating appropriately. Discussed findings and plan with patient/guardian, who agrees with care plan. All questions answered. Return precautions discussed and outpatient follow up given.    Final Clinical Impressions(s) / ED Diagnoses   Final diagnoses:  Enteritis    New  Prescriptions Discharge Medication List as of 02/16/2016  3:49 AM       Monico Blitz, PA-C 02/16/16 0502    Ezequiel Essex, MD 02/16/16 [phone]

## 2016-02-29 ENCOUNTER — Other Ambulatory Visit: Payer: Self-pay

## 2016-02-29 NOTE — Addendum Note (Signed)
Addended by: Tobi Bastos on: 02/29/2016 08:38 PM   Modules accepted: Orders

## 2016-02-29 NOTE — Patient Outreach (Signed)
   Telephone call made to patient for community care coordination. Patient denies need for community care coordination at this time, however, she did request information on low cost dental care in this area. RNCM placed consult for LCSW to assist.   Plan: Discharge from caseload

## 2016-03-01 NOTE — Patient Outreach (Signed)
[facility]) Care Management  03/01/2016  [name] 07/24/1950 [id number]   Request received from Eula Fried, LCSW to mail patient dental resources. Information mailed today, 03/01/16   [name], [place] Management Assistant

## 2016-03-02 ENCOUNTER — Other Ambulatory Visit: Payer: Self-pay | Admitting: Licensed Clinical Social Worker

## 2016-03-02 NOTE — Patient Outreach (Signed)
[facility] [facility]) Care Management  03/02/2016  Keity Crespi July 05, 1950 [id number]   Assessment- CSW received new referral on patient on 03/01/16. CSW completed initial outreach on 03/02/16 and was able to speak to patient. CSW introduced self, reason for call and of [facility] services. Patient provided HIPPA verifications. Patient states "I just wanted some dental resources if you have them." CSW provided education on the available dental resources in [place]. Patient wishes for CSW to mail out list of resources to her residence but states that she does not need any further social work assistance at this time. CSW will mail patient dental resources.  Plan-CSW will not open case. CSW will notify [place] Management Assistant.  Eula Fried, BSW, MSW, [email or web address] Phone: [phone] Fax: [phone]

## 2016-03-07 ENCOUNTER — Ambulatory Visit
Admission: RE | Admit: 2016-03-07 | Discharge: 2016-03-07 | Disposition: A | Discharge status: Home / Self Care | Payer: PPO | Source: Ambulatory Visit | Attending: Family Medicine | Admitting: Family Medicine

## 2016-03-07 DIAGNOSIS — Z1231 Encounter for screening mammogram for malignant neoplasm of breast: Secondary | ICD-10-CM | POA: Diagnosis not present

## 2016-03-10 ENCOUNTER — Telehealth: Payer: Self-pay | Admitting: Family Medicine

## 2016-03-10 ENCOUNTER — Telehealth: Payer: Self-pay

## 2016-03-10 DIAGNOSIS — S6992XS Unspecified injury of left wrist, hand and finger(s), sequela: Secondary | ICD-10-CM

## 2016-03-10 NOTE — Telephone Encounter (Signed)
Pt. Returned call and asked why she had received a call. Pt was told that the nurse called and left a message with her son about her results. PT. Became upset and stated " why would she leave my information with my son". Pt. Was informed that the nurse only left a message for her to return call and requesting her results. Pt. Hanged up the phone. Please f/u.

## 2016-03-10 NOTE — Telephone Encounter (Signed)
Pt was called and a message was left with her son to inform her of her results.

## 2016-03-14 ENCOUNTER — Ambulatory Visit: Payer: PPO | Attending: Family Medicine | Admitting: Pharmacist

## 2016-03-14 DIAGNOSIS — N280 Ischemia and infarction of kidney: Secondary | ICD-10-CM

## 2016-03-14 LAB — POCT INR: INR: 1.4

## 2016-03-14 MED ORDER — ACETAMINOPHEN-CODEINE #3 300-30 MG PO TABS: 1.0000 | ORAL_TABLET | Freq: Three times a day (TID) | ORAL | 0 refills | Status: DC | PRN

## 2016-03-14 NOTE — Telephone Encounter (Signed)
Pt was called and informed of script being ready for pick up.

## 2016-03-14 NOTE — Telephone Encounter (Signed)
Please inform patient that tylenol #3 ready for pick up She is advised to sign controlled substance contract for chronic pain management

## 2016-03-14 NOTE — Telephone Encounter (Signed)
Pt. Came into facility requesting a refill on Tylenol # 3.  °Please f/u  °

## 2016-03-23 ENCOUNTER — Ambulatory Visit: Payer: PPO | Attending: Family Medicine | Admitting: Pharmacist

## 2016-03-23 DIAGNOSIS — N280 Ischemia and infarction of kidney: Secondary | ICD-10-CM

## 2016-03-23 LAB — POCT INR: INR: 2.6

## 2016-04-02 ENCOUNTER — Other Ambulatory Visit: Payer: Self-pay | Admitting: Family Medicine

## 2016-04-02 DIAGNOSIS — S6992XS Unspecified injury of left wrist, hand and finger(s), sequela: Secondary | ICD-10-CM

## 2016-04-06 ENCOUNTER — Ambulatory Visit: Payer: PPO | Attending: Family Medicine | Admitting: Pharmacist

## 2016-04-06 DIAGNOSIS — N280 Ischemia and infarction of kidney: Secondary | ICD-10-CM

## 2016-04-06 LAB — POCT INR: INR: 1.3

## 2016-04-10 ENCOUNTER — Ambulatory Visit: Payer: Self-pay | Admitting: Family Medicine

## 2016-04-11 ENCOUNTER — Ambulatory Visit: Payer: PPO | Attending: Family Medicine | Admitting: Pharmacist

## 2016-04-11 DIAGNOSIS — N280 Ischemia and infarction of kidney: Secondary | ICD-10-CM

## 2016-04-11 LAB — POCT INR: INR: 2.4

## 2016-04-14 ENCOUNTER — Encounter (HOSPITAL_COMMUNITY): Payer: Self-pay | Admitting: Emergency Medicine

## 2016-04-14 DIAGNOSIS — R079 Chest pain, unspecified: Secondary | ICD-10-CM | POA: Diagnosis not present

## 2016-04-14 DIAGNOSIS — R109 Unspecified abdominal pain: Secondary | ICD-10-CM | POA: Diagnosis not present

## 2016-04-14 DIAGNOSIS — Z79899 Other long term (current) drug therapy: Secondary | ICD-10-CM | POA: Diagnosis not present

## 2016-04-14 DIAGNOSIS — Z91040 Latex allergy status: Secondary | ICD-10-CM | POA: Insufficient documentation

## 2016-04-14 DIAGNOSIS — Z7901 Long term (current) use of anticoagulants: Secondary | ICD-10-CM | POA: Diagnosis not present

## 2016-04-14 DIAGNOSIS — Z7982 Long term (current) use of aspirin: Secondary | ICD-10-CM | POA: Diagnosis not present

## 2016-04-14 DIAGNOSIS — Z87891 Personal history of nicotine dependence: Secondary | ICD-10-CM | POA: Diagnosis not present

## 2016-04-14 DIAGNOSIS — I10 Essential (primary) hypertension: Secondary | ICD-10-CM | POA: Diagnosis not present

## 2016-04-14 LAB — CBC WITH DIFFERENTIAL/PLATELET
Basophils Absolute: 0 10*3/uL (ref 0.0–0.1)
Basophils Relative: 0 %
EOS ABS: 0.3 10*3/uL (ref 0.0–0.7)
Eosinophils Relative: 3 %
HEMATOCRIT: 38.9 % (ref 36.0–46.0)
HEMOGLOBIN: 12.5 g/dL (ref 12.0–15.0)
LYMPHS ABS: 3.3 10*3/uL (ref 0.7–4.0)
LYMPHS PCT: 28 %
MCH: 30.6 pg (ref 26.0–34.0)
MCHC: 32.1 g/dL (ref 30.0–36.0)
MCV: 95.1 fL (ref 78.0–100.0)
MONOS PCT: 5 %
Monocytes Absolute: 0.5 10*3/uL (ref 0.1–1.0)
NEUTROS ABS: 7.6 10*3/uL (ref 1.7–7.7)
NEUTROS PCT: 64 %
Platelets: 261 10*3/uL (ref 150–400)
RBC: 4.09 MIL/uL (ref 3.87–5.11)
RDW: 14.1 % (ref 11.5–15.5)
WBC: 11.7 10*3/uL — AB (ref 4.0–10.5)

## 2016-04-14 LAB — URINALYSIS, ROUTINE W REFLEX MICROSCOPIC
BACTERIA UA: NONE SEEN
BILIRUBIN URINE: NEGATIVE
Glucose, UA: NEGATIVE mg/dL
KETONES UR: NEGATIVE mg/dL
LEUKOCYTES UA: NEGATIVE
NITRITE: NEGATIVE
PH: 5 (ref 5.0–8.0)
Protein, ur: NEGATIVE mg/dL
SPECIFIC GRAVITY, URINE: 1.012 (ref 1.005–1.030)

## 2016-04-14 LAB — I-STAT TROPONIN, ED: TROPONIN I, POC: 0 ng/mL (ref 0.00–0.08)

## 2016-04-14 NOTE — ED Triage Notes (Signed)
Patient reports generalized body aches , right lower back pain , bilateral arms pain / chest tightness onset this week with occasional dry cough and fatigue .

## 2016-04-15 ENCOUNTER — Emergency Department (HOSPITAL_COMMUNITY): Payer: PPO

## 2016-04-15 ENCOUNTER — Emergency Department (HOSPITAL_COMMUNITY)
Admission: EM | Admit: 2016-04-15 | Discharge: 2016-04-15 | Disposition: A | Discharge status: Home / Self Care | Payer: PPO | Attending: Emergency Medicine | Admitting: Emergency Medicine

## 2016-04-15 DIAGNOSIS — R109 Unspecified abdominal pain: Secondary | ICD-10-CM | POA: Diagnosis not present

## 2016-04-15 DIAGNOSIS — R079 Chest pain, unspecified: Secondary | ICD-10-CM | POA: Diagnosis not present

## 2016-04-15 LAB — COMPREHENSIVE METABOLIC PANEL
ALBUMIN: 3.7 g/dL (ref 3.5–5.0)
ALT: 16 U/L (ref 14–54)
ANION GAP: 11 (ref 5–15)
AST: 21 U/L (ref 15–41)
Alkaline Phosphatase: 99 U/L (ref 38–126)
BILIRUBIN TOTAL: 0.7 mg/dL (ref 0.3–1.2)
BUN: 5 mg/dL — AB (ref 6–20)
CO2: 24 mmol/L (ref 22–32)
Calcium: 9.1 mg/dL (ref 8.9–10.3)
Chloride: 106 mmol/L (ref 101–111)
Creatinine, Ser: 0.7 mg/dL (ref 0.44–1.00)
GFR calc Af Amer: >60 mL/min (ref >60)
GFR calc non Af Amer: >60 mL/min (ref >60)
GLUCOSE: 116 mg/dL — AB (ref 65–99)
POTASSIUM: 3.3 mmol/L — AB (ref 3.5–5.1)
SODIUM: 141 mmol/L (ref 135–145)
TOTAL PROTEIN: 6.8 g/dL (ref 6.5–8.1)

## 2016-04-15 LAB — PROTIME-INR
INR: 2
Prothrombin Time: 23 seconds — ABNORMAL HIGH (ref 11.4–15.2)

## 2016-04-15 MED ORDER — HYDROCODONE-ACETAMINOPHEN 5-325 MG PO TABS: 1.0000 | ORAL_TABLET | Freq: Four times a day (QID) | ORAL | 0 refills | Status: DC | PRN

## 2016-04-15 NOTE — ED Notes (Signed)
Patient returned from [facility]. Placed back on the monitor by Tonia Ghent, [place]

## 2016-04-15 NOTE — ED Notes (Signed)
Patient also reported that she currently takes coumadin during my encounter with her. No INR ordered by triage personnel; test ordered.

## 2016-04-15 NOTE — Discharge Instructions (Signed)
Hydrocodone as prescribed as needed for pain. ° °Follow-up with your primary Dr. if not improving in the next week.  °

## 2016-04-15 NOTE — ED Triage Notes (Signed)
Patient requested to speak with charge RN in waiting area. Invited patient to triage room to speak. Concerns expressed by patient about her right leg. Stated, "I think I have a blood clot". Assessed patient's legs and noted mild edema around bilateral ankles. No calf pain with palpation or dorsiflexion, temperature and color WDL of both lower limbs, and pulses present in both lower extremities. Apologies made for wait time, patient understanding; and thanked this charge RN for my time.

## 2016-04-15 NOTE — ED Provider Notes (Signed)
[place] DEPT Provider Note   CSN: [id number] Arrival date & time: 04/14/16  2302     History   Chief Complaint Chief Complaint  Patient presents with  . Generalized Body Aches    Back Pain     HPI [name] is a 66 y.o. female.  Patient is a 66 year old female with past medical history of renal infarct, hypertension, high cholesterol. She presents for evaluation of multiple complaints. She is reporting pain in her right flank, right arm, right leg, headache, urinary frequency, and generalized malaise. This is been ongoing for the past several days. She denies any injury or trauma. She denies any fevers or chills. She reports this feels similar to what she experienced when she was hospitalized with her renal infarct. She denies any hematuria.      Past Medical History:  Diagnosis Date  . Abnormal EKG 2012   hospitalized for T wave inversion in lateral leads with MSK chest pains, normal ECHO, negative trops, no cardiology consult. recent EKG 7/15 stable T wave inversions.   . 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 Arthritis 2000  . Heart murmur   . Hyperlipidemia   . Hypertension 2010  . Meniscus tear    L KNEE    Patient Active Problem List   Diagnosis Date Noted  . 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 Essential hypertension   . HLD (hyperlipidemia)   . Aortic atherosclerosis ([place])   . Right-sided low back pain without sciatica 07/08/2015  . 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 Left leg pain 06/14/2015  . Allergic rhinitis 03/09/2015  . Seasonal allergies 03/09/2015  . Abdominal pain 03/09/2015  . Hypopigmentation 03/09/2015  . Pain in both feet 11/10/2014  . Gingivitis 11/10/2014  . Osteoarthritis of left knee 07/08/2014  . DJD (degenerative joint disease), lumbar 07/08/2014  . Osteoarthritis of right knee 07/08/2014  . Foot swelling 06/11/2014  . Urinary frequency 06/11/2014  . Pain due to  onychomycosis of toenail 06/11/2014  . Borborygmi 06/11/2014  . Allergic rhinoconjunctivitis of both eyes 06/11/2014  . Nearsightedness 04/27/2014  . Abnormal EKG   . Hypertension 12/11/2013    Past Surgical History:  Procedure Laterality Date  . ABDOMINAL SURGERY  2010   for bowel blockage  . KNEE ARTHROSCOPY Left 09/25/2013   Procedure: LEFT KNEE ARTHROSCOPY WITH PARTIAL MEDIAL AND LATERAL  MENISCECTOMY/DEBRIDEMENT/MICRO FRACTURE MEDIAL FEMORAL CONDYLE, REMOVAL OF OSTEOCONDRAL FRAGMENT;  Surgeon: Johnn Hai, MD;  Location: [facility];  Service: Orthopedics;  Laterality: Left;  . KNEE SURGERY Right 1999  . TEE WITHOUT CARDIOVERSION N/A 10/04/2015   Procedure: TRANSESOPHAGEAL ECHOCARDIOGRAM (TEE);  Surgeon: Sueanne Margarita, MD;  Location: [facility] ENDOSCOPY;  Service: Cardiovascular;  Laterality: N/A;  . TUBAL LIGATION  1979    OB History    No data available       Home Medications    Prior to Admission medications   Medication Sig Start Date End Date Taking? Authorizing Provider  acetaminophen-codeine (TYLENOL #3) 300-30 MG tablet Take 1 tablet by mouth every 8 (eight) hours as needed for moderate pain. 03/14/16   Josalyn Funches, MD  amLODipine (NORVASC) 10 MG tablet Take 1 tablet (10 mg total) by mouth daily. 11/25/15   Boykin Nearing, MD  aspirin EC 81 MG tablet Take 81 mg by mouth daily.    Historical Provider, MD  atorvastatin (LIPITOR) 40 MG tablet Take 1 tablet (40 mg total) by mouth daily at 6  PM. 11/09/15   Boykin Nearing, MD  carvedilol (COREG) 6.25 MG tablet Take 1 tablet (6.25 mg total) by mouth 2 (two) times daily with a meal. 11/25/15   Boykin Nearing, MD  Multiple Vitamin (MULITIVITAMIN WITH MINERALS) TABS Take 1 tablet by mouth daily.    Historical Provider, MD  potassium chloride SA (K-DUR,KLOR-CON) 20 MEQ tablet Take 1 tablet (20 mEq total) by mouth daily. 02/16/16   Nicole Pisciotta, PA-C  tiZANidine (ZANAFLEX) 4 MG tablet Take 1 tablet (4 mg total) by mouth every 8  (eight) hours as needed for muscle spasms. 10/14/15   Josalyn Funches, MD  traMADol (ULTRAM) 50 MG tablet Take 1 tablet (50 mg total) by mouth every 6 (six) hours as needed. 02/16/16   Nicole Pisciotta, PA-C  warfarin (COUMADIN) 5 MG tablet Take 1 tablet (5 mg total) by mouth daily. Take as directed by [facility] 01/31/16   Boykin Nearing, MD    Family History Family History  Problem Relation Age of Onset  . Hypertension Mother   . Heart disease Mother     has a pacemaker   . Heart failure Father   . Hypertension Father   . Cancer Neg Hx   . Colon cancer Neg Hx     Social History Social History  Substance Use Topics  . Smoking status: Former Smoker    Quit date: 07/29/2010  . Smokeless tobacco: Never Used  . Alcohol use No     Allergies   Shrimp [shellfish allergy]; Latex; and Penicillins   Review of Systems Review of Systems  All other systems reviewed and are negative.    Physical Exam Updated Vital Signs BP 141/63   Pulse 61   Temp 98.2 F (36.8 C) (Oral)   Resp 16   SpO2 98%   Physical Exam  Constitutional: She is oriented to person, place, and time. She appears well-developed and well-nourished. No distress.  HENT:  Head: Normocephalic and atraumatic.  Neck: Normal range of motion. Neck supple.  Cardiovascular: Normal rate and regular rhythm.  Exam reveals no gallop and no friction rub.   No murmur heard. Pulmonary/Chest: Effort normal and breath sounds normal. No respiratory distress. She has no wheezes.  Abdominal: Soft. Bowel sounds are normal. She exhibits no distension. There is no tenderness.  Musculoskeletal: Normal range of motion.  Neurological: She is alert and oriented to person, place, and time.  Skin: Skin is warm and dry. She is not diaphoretic.  Nursing note and vitals reviewed.    ED Treatments / Results  Labs (all labs ordered are listed, but only abnormal results are displayed) Labs Reviewed  CBC WITH DIFFERENTIAL/PLATELET -  Abnormal; Notable for the following:       Result Value   WBC 11.7 (*)    All other components within normal limits  COMPREHENSIVE METABOLIC PANEL - Abnormal; Notable for the following:    Potassium 3.3 (*)    Glucose, Bld 116 (*)    BUN 5 (*)    All other components within normal limits  URINALYSIS, ROUTINE W REFLEX MICROSCOPIC - Abnormal; Notable for the following:    Hgb urine dipstick SMALL (*)    Squamous Epithelial / LPF 0-5 (*)    All other components within normal limits  PROTIME-INR - Abnormal; Notable for the following:    Prothrombin Time 23.0 (*)    All other components within normal limits  I-STAT TROPOININ, ED    EKG  EKG Interpretation None  Radiology Dg Chest 2 View  Result Date: 04/15/2016 CLINICAL DATA:  66 y/o  F; chest tightness. EXAM: CHEST  2 VIEW COMPARISON:  09/24/2013 chest radiograph FINDINGS: The heart size and mediastinal contours are within normal limits and stable. Both lungs are clear. Moderate dextrocurvature of the thoracolumbar junction. IMPRESSION: No active cardiopulmonary disease. Electronically Signed   By: Kristine Garbe M.D.   On: 04/15/2016 00:46    Procedures Procedures (including critical care time)  Medications Ordered in ED Medications - No data to display   Initial Impression / Assessment and Plan / ED Course  I have reviewed the triage vital signs and the nursing notes.  Pertinent labs & imaging results that were available during my care of the patient were reviewed by me and considered in my medical decision making (see chart for details).     Workup revealed no evidence for recurrent renal infarct or other abnormality. The patient is complaining of pain in multiple sites and I see nothing emergent that would explain this. She will be discharged to home, to return as needed for any problems.  Final Clinical Impressions(s) / ED Diagnoses   Final diagnoses:  None    New Prescriptions New Prescriptions     No medications on file     Veryl Speak, MD 04/15/16 1557

## 2016-04-15 NOTE — ED Notes (Signed)
Pt was taken to CT

## 2016-04-15 NOTE — ED Notes (Signed)
Total body  Pain headache back axche and some pain in her rt arm for one week

## 2016-04-17 ENCOUNTER — Ambulatory Visit: Payer: PPO | Attending: Family Medicine | Admitting: Family Medicine

## 2016-04-17 ENCOUNTER — Encounter: Payer: Self-pay | Admitting: Family Medicine

## 2016-04-17 VITALS — BP 159/80 | HR 76 | Temp 98.8°F | Ht 65.0 in | Wt 160.8 lb

## 2016-04-17 DIAGNOSIS — R350 Frequency of micturition: Secondary | ICD-10-CM | POA: Diagnosis not present

## 2016-04-17 DIAGNOSIS — M549 Dorsalgia, unspecified: Secondary | ICD-10-CM | POA: Diagnosis not present

## 2016-04-17 DIAGNOSIS — M47816 Spondylosis without myelopathy or radiculopathy, lumbar region: Secondary | ICD-10-CM

## 2016-04-17 DIAGNOSIS — M47896 Other spondylosis, lumbar region: Secondary | ICD-10-CM | POA: Diagnosis not present

## 2016-04-17 LAB — POCT GLYCOSYLATED HEMOGLOBIN (HGB A1C): HEMOGLOBIN A1C: 5.7

## 2016-04-17 LAB — POCT CBG (FASTING - GLUCOSE)-MANUAL ENTRY: Glucose Fasting, POC: 73 mg/dL (ref 70–99)

## 2016-04-17 MED ORDER — ACETAMINOPHEN-CODEINE #3 300-30 MG PO TABS: 1.0000 | ORAL_TABLET | Freq: Three times a day (TID) | ORAL | 2 refills | Status: DC | PRN

## 2016-04-17 NOTE — Assessment & Plan Note (Signed)
Lumbar DJD Plan: Pain control with tylenol #3 Home exercises

## 2016-04-17 NOTE — Patient Instructions (Addendum)
[name] was seen today for back pain.  Diagnoses and all orders for this visit:  Osteoarthritis of lumbar spine, unspecified spinal osteoarthritis complication status -     acetaminophen-codeine (TYLENOL #3) 300-30 MG tablet; Take 1 tablet by mouth every 8 (eight) hours as needed for moderate pain.  Urinary frequency -     HgB A1c -     Glucose (CBG), Fasting   F/u in 3 months for HTN   Dr. Adrian Blackwater  Spondylolisthesis Rehab Ask your health care provider which exercises are safe for you. Do exercises exactly as told by your health care provider and adjust them as directed. It is normal to feel mild stretching, pulling, tightness, or discomfort as you do these exercises, but you should stop right away if you feel sudden pain or your pain gets worse. Do not begin these exercises until told by your health care provider. Stretching and range of motion exercises These exercises warm up your muscles and joints and improve the movement and flexibility of your hips and your back. These exercises may also help to relieve pain, numbness, and tingling. Exercise A: Single knee to chest 1. Lie on your back on a firm surface with both legs straight. 2. Bend one of your knees. Use your hands to move your knee up toward your chest until you feel a gentle stretch in your lower back and buttock.  Hold your leg in this position by holding onto the front of your knee.  Keep your other leg as straight as possible. 3. Hold for __________ seconds. 4. Slowly return to the starting position. 5. Repeat this exercise with your other leg. Repeat __________ times. Complete this exercise __________ times a day. Exercise B: Double knee to chest 1. Lie on your back on a firm surface with both legs straight. 2. Bend one of your knees and move it toward your chest until you feel a gentle stretch in your lower back and buttock. 3. Tense your abdominal muscles and repeat the previous step with your other leg. 4. Hold  both of your legs in this position by holding onto the backs of your thighs or the fronts of your knees. 5. Hold for __________ seconds. 6. Tense your abdominal muscles and slowly move your legs back to the floor, one leg at a time. Repeat __________ times. Complete this exercise __________ times a day. Strengthening exercises These exercises build strength and endurance in your back. Endurance is the ability to use your muscles for a long time, even after they get tired. Exercise C: Pelvic tilt 1. Lie on your back on a firm bed or the floor. Bend your knees and keep your feet flat. 2. Tense your abdominal muscles. Tip your pelvis up toward the ceiling and flatten your lower back into the floor.  To help with this exercise, you may place a small towel under your lower back and try to push your back into the towel. 3. Hold for __________ seconds. 4. Let your muscles relax completely before you repeat this exercise. Repeat __________ times. Complete this exercise __________ times a day. Exercise D: Abdominal crunch 1. Lie on your back on a firm surface. Bend your knees and keep your feet flat. Cross your arms over your chest. 2. Tuck your chin down toward your chest, without bending your neck. 3. Use your abdominal muscles to lift your upper body off of the ground, straight up into the air.  Try to lift yourself until your shoulder blades are off the  ground. You may need to work up to this.  Keep your lower back on the ground while you crunch upward.  Do not hold your breath. 4. Slowly lower yourself down. Keep your abdominal muscles tense until you are back to the starting position. Repeat __________ times. Complete this exercise __________ times a day. Exercise E: Alternating arm and leg raises 1. Get on your hands and knees on a firm surface. If you are on a hard floor, you may want to use padding to cushion your knees, such as an exercise mat. 2. Line up your arms and legs. Your hands  should be below your shoulders, and your knees should be below your hips. 3. Lift your left leg behind you. At the same time, raise your right arm and straighten it in front of you.  Do not lift your leg higher than your hip.  Do not lift your arm higher than your shoulder.  Keep your abdominal and back muscles tight.  Keep your hips facing the ground.  Do not arch your back.  Keep your balance carefully, and do not hold your breath. 4. Hold for __________ seconds. 5. Slowly return to the starting position and repeat with your right leg and your left arm. Repeat __________ times. Complete this exercise __________ times a day. Posture and body mechanics   Body mechanics refers to the movements and positions of your body while you do your daily activities. Posture is part of body mechanics. Good posture and healthy body mechanics can help to relieve stress in your body's tissues and joints. Good posture means that your spine is in its natural S-curve position (your spine is neutral), your shoulders are pulled back slightly, and your head is not tipped forward. The following are general guidelines for applying improved posture and body mechanics to your everyday activities. Standing   When standing, keep your spine neutral and your feet about hip-width apart. Keep a slight bend in your knees. Your ears, shoulders, and hips should line up.  When you do a task in which you stand in one place for a long time, place one foot up on a stable object that is 2-4 inches (5-10 cm) high, such as a footstool. This helps keep your spine neutral. Sitting  When sitting, keep your spine neutral and keep your feet flat on the floor. Use a footrest, if necessary, and keep your thighs parallel to the floor. Avoid rounding your shoulders, and avoid tilting your head forward.  When working at a desk or a computer, keep your desk at a height where your hands are slightly lower than your elbows. Slide your chair  under your desk so you are close enough to maintain good posture.  When working at a computer, place your monitor at a height where you are looking straight ahead and you do not have to tilt your head forward or downward to look at the screen. Resting   When lying down and resting, avoid positions that are most painful for you.  If you have pain with activities such as sitting, bending, stooping, or squatting (flexion-based activities), lie in a position in which your body does not bend very much. For example, avoid curling up on your side with your arms and knees near your chest (fetal position).  If you have pain with activities such as standing for a long time or reaching with your arms (extension-based activities), lie with your spine in a neutral position and bend your knees slightly. Try the  following positions:  Lying on your side with a pillow between your knees.  Lying on your back with a pillow under your knees. Lifting   When lifting objects, keep your feet at least shoulder-width apart and tighten your abdominal muscles.  Bend your knees and hips and keep your spine neutral. It is important to lift using the strength of your legs, not your back. Do not lock your knees straight out.  Always ask for help to lift heavy or awkward objects. This information is not intended to replace advice given to you by your health care provider. Make sure you discuss any questions you have with your health care provider. Document Released: 03/13/2005 Document Revised: 11/18/2015 Document Reviewed: 12/22/2014 Elsevier Interactive Patient Education  2017 [employer].

## 2016-04-17 NOTE — Progress Notes (Signed)
Subjective:  Patient ID: [name], female    DOB: Apr 16, 1950  Age: 66 y.o. MRN: [id number]  CC: Back Pain   HPI [name] presents for   1. ED f/u: patient was seen in ED on 04/15/2016 for right flank pain. She has hx of right renal artery infarct. She takes coumadin. 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 CXR was done and negative.  INE 2.00. Trop negative. UA with small blood. K + 3.3 baseline ( 3.0-3.4), WBC 11.7.   She complains of low back pain and fatigue. She walks with a walker for long distances. No recent falls or trauma. She also reports cough, headache, congestion, stomach upset.   Social History  Substance Use Topics  . Smoking status: Former Smoker    Quit date: 07/29/2010  . Smokeless tobacco: Never Used  . Alcohol use No   Outpatient Medications Prior to Visit  Medication Sig Dispense Refill  . acetaminophen-codeine (TYLENOL #3) 300-30 MG tablet Take 1 tablet by mouth every 8 (eight) hours as needed for moderate pain. 60 tablet 0  . amLODipine (NORVASC) 10 MG tablet Take 1 tablet (10 mg total) by mouth daily. 30 tablet 11  . aspirin EC 81 MG tablet Take 81 mg by mouth daily.    Marland Kitchen atorvastatin (LIPITOR) 40 MG tablet Take 1 tablet (40 mg total) by mouth daily at 6 PM. 30 tablet 3  . carvedilol (COREG) 6.25 MG tablet Take 1 tablet (6.25 mg total) by mouth 2 (two) times daily with a meal. 60 tablet 3  . HYDROcodone-acetaminophen (NORCO) 5-325 MG tablet Take 1-2 tablets by mouth every 6 (six) hours as needed. 12 tablet 0  . Multiple Vitamin (MULITIVITAMIN WITH MINERALS) TABS Take 1 tablet by mouth daily.    . potassium chloride SA (K-DUR,KLOR-CON) 20 MEQ tablet Take 1 tablet (20 mEq total) by mouth daily. 3 tablet 0  . tiZANidine (ZANAFLEX) 4 MG tablet Take 1 tablet (4 mg total) by mouth every 8 (eight) hours as needed for muscle spasms. 30 tablet 2  . traMADol (ULTRAM) 50 MG tablet Take 1  tablet (50 mg total) by mouth every 6 (six) hours as needed. 7 tablet 0  . warfarin (COUMADIN) 5 MG tablet Take 1 tablet (5 mg total) by mouth daily. Take as directed by [facility] 30 tablet 2   No facility-administered medications prior to visit.     ROS Review of Systems  Constitutional: Negative for chills and fever.  Eyes: Negative for visual disturbance.  Respiratory: Negative for shortness of breath.   Cardiovascular: Negative for chest pain.  Gastrointestinal: Positive for abdominal pain and nausea. Negative for blood in stool.  Genitourinary: Positive for flank pain.  Musculoskeletal: Positive for joint swelling (in ankles ) and myalgias. Negative for arthralgias and back pain.  Skin: Negative for rash.  Allergic/Immunologic: Negative for immunocompromised state.  Neurological: Positive for headaches.  Hematological: Negative for adenopathy. Does not bruise/bleed easily.  Psychiatric/Behavioral: Negative for dysphoric mood and suicidal ideas.    Objective:  BP (!) 159/80 (BP Location: Left Arm, Patient Position: Sitting, Cuff Size: Small)   Pulse 76   Temp 98.8 F (37.1 C) (Oral)   Ht 5\' 5"  (1.651 m)   Wt 160 lb 12.8 oz (72.9 kg)   SpO2 99%   BMI 26.76 kg/m   BP/Weight 04/17/2016 04/15/2016 [id number]  Systolic BP [id number]  Diastolic BP 80 63 65  Wt. (Lbs) 160.8 - -  BMI 26.76 - 26.34    Physical Exam  Constitutional: She is oriented to person, place, and time. She appears well-developed and well-nourished. No distress.  HENT:  Head: Normocephalic and atraumatic.  Nose: Mucosal edema present.  Mouth/Throat: Abnormal dentition.  Cardiovascular: Normal rate, regular rhythm, normal heart sounds and intact distal pulses.   Pulmonary/Chest: Effort normal and breath sounds normal.  Musculoskeletal: She exhibits no edema.       Back:  Neurological: She is alert and oriented to person, place, and time.  Skin: Skin is warm and dry. No rash noted.    Psychiatric: She has a normal mood and affect.   Lab Results  Component Value Date   INR 2.00 04/15/2016   INR 2.4 04/11/2016   INR 1.3 04/06/2016   Lab Results  Component Value Date   HGBA1C 5.7 04/17/2016   CBG 73    Assessment & Plan:  [name] was seen today for back pain.  Diagnoses and all orders for this visit:  Osteoarthritis of lumbar spine, unspecified spinal osteoarthritis complication status -     acetaminophen-codeine (TYLENOL #3) 300-30 MG tablet; Take 1 tablet by mouth every 8 (eight) hours as needed for moderate pain.  Urinary frequency -     HgB A1c -     Glucose (CBG), Fasting   There are no diagnoses linked to this encounter.  No orders of the defined types were placed in this encounter.   Follow-up: No Follow-up on file.   Boykin Nearing MD

## 2016-04-25 ENCOUNTER — Ambulatory Visit: Payer: PPO | Attending: Family Medicine | Admitting: *Deleted

## 2016-04-25 NOTE — Progress Notes (Signed)
2.4 INR

## 2016-05-10 ENCOUNTER — Other Ambulatory Visit: Payer: Self-pay | Admitting: Pharmacist

## 2016-05-10 DIAGNOSIS — N280 Ischemia and infarction of kidney: Secondary | ICD-10-CM

## 2016-05-10 MED ORDER — WARFARIN SODIUM 5 MG PO TABS: 5.0000 mg | ORAL_TABLET | Freq: Every day | ORAL | 2 refills | Status: DC

## 2016-05-16 ENCOUNTER — Telehealth: Payer: Self-pay | Admitting: Family Medicine

## 2016-05-16 ENCOUNTER — Ambulatory Visit: Payer: Self-pay | Admitting: Pharmacist

## 2016-05-16 DIAGNOSIS — N280 Ischemia and infarction of kidney: Secondary | ICD-10-CM

## 2016-05-16 MED ORDER — ASPIRIN EC 81 MG PO TBEC: 81.0000 mg | DELAYED_RELEASE_TABLET | Freq: Every day | ORAL | 5 refills | Status: DC

## 2016-05-16 NOTE — Assessment & Plan Note (Signed)
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

## 2016-05-16 NOTE — Telephone Encounter (Signed)
Called patient, verified name and DOB.  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   I informed her that if she developed another VTE in the future she would need lifelong anticoagulation.   She can return for INR check to ensure normal INR off coumadin.   She voices understanding and agrees with plan.   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

## 2016-05-16 NOTE — Telephone Encounter (Signed)
-----   Message from Rica Mast, [facility] sent at 05/08/2016  3:31 PM EST ----- Regarding: FW: INR What is the plan for Yittel - do you have a planned stop date for her coumadin? Thanks! Erline Levine ----- Message ----- From: Annitta Jersey Sent: 05/08/2016   3:21 PM To: Rica Mast, [facility] Subject: INR                                            Jari Sportsman called and would like to know if she still needs to continue with her INR. Please f/u

## 2016-05-23 ENCOUNTER — Ambulatory Visit: Payer: PPO | Attending: Family Medicine | Admitting: Pharmacist

## 2016-05-23 VITALS — BP 150/70 | HR 73

## 2016-05-23 DIAGNOSIS — R600 Localized edema: Secondary | ICD-10-CM | POA: Insufficient documentation

## 2016-05-23 DIAGNOSIS — I10 Essential (primary) hypertension: Secondary | ICD-10-CM | POA: Diagnosis not present

## 2016-05-23 MED ORDER — CARVEDILOL 12.5 MG PO TABS: 12.5000 mg | ORAL_TABLET | Freq: Two times a day (BID) | ORAL | 3 refills | Status: DC

## 2016-05-23 MED ORDER — AMLODIPINE BESYLATE 5 MG PO TABS: 5.0000 mg | ORAL_TABLET | Freq: Every day | ORAL | 2 refills | Status: DC

## 2016-05-23 NOTE — Progress Notes (Signed)
   S:    Patient arrives in good spirits. Presents to the clinic for review of her medications.  Patient reports adherence with medications except for the potassium, which she has not picked up. Feels like she is doing very well overall.   She denies any adverse effects for her medications except bilateral lower extremity edema.   O:   Last 3 Office BP readings: BP Readings from Last 3 Encounters:  05/23/16 (!) 150/70  04/17/16 (!) 159/80  04/15/16 127/63    BMET    Component Value Date/Time   NA 141 04/14/2016 2323   K 3.3 (L) 04/14/2016 2323   CL 106 04/14/2016 2323   CO2 24 04/14/2016 2323   GLUCOSE 116 (H) 04/14/2016 2323   BUN 5 (L) 04/14/2016 2323   CREATININE 0.70 04/14/2016 2323   CREATININE 0.74 10/14/2015 0959   CALCIUM 9.1 04/14/2016 2323   GFRNONAA >60 04/14/2016 2323   GFRNONAA 87 12/11/2013 1747   GFRAA >60 04/14/2016 2323   GFRAA >89 12/11/2013 1747    A/P: Hypertension: longstanding, currently uncontrolled with bilateral lower extremity edema possibly from the amlodipine. Discussed with Dr. Adrian Blackwater. This is dose related so will decrease the amlodipine to 5 mg daily to see if that helps and to also increase the carvedilol to 12.5 mg BID. Patient may need hydrochlorothiazide in the future but needs to start her potassium supplements and get her potassium WNL before starting it. Patient to pick up her potassium tablets today.   Results reviewed and written information provided.   Total time in face-to-face counseling 20 minutes.   F/U Clinic Visit with Dr. Adrian Blackwater in 1-2 weeks.  Patient seen with Windell Moulding, PharmD Candidate

## 2016-05-23 NOTE — Patient Instructions (Addendum)
Thanks for coming to see [place]!  Decrease amlodipine to 5 mg daily - see if this helps with the swelling. If it doesn't, we may need to stop it to see if it is causing the swelling.  Increase carvedilol to 12.5 mg twice daily   See Dr. Adrian Blackwater in 1 week or me if she is unavailable.

## 2016-06-09 ENCOUNTER — Telehealth: Payer: Self-pay | Admitting: Family Medicine

## 2016-06-09 DIAGNOSIS — E876 Hypokalemia: Secondary | ICD-10-CM

## 2016-06-09 NOTE — Telephone Encounter (Signed)
Pt calling stating that Erline Levine was supposed to call in a script for some potassium but that it never got called in. Please f/u with pt. Thank you.

## 2016-06-12 NOTE — Telephone Encounter (Signed)
I am not sure patient is supposed to be continued on potassium. Will forward to Dr. Adrian Blackwater.

## 2016-06-13 MED ORDER — POTASSIUM CHLORIDE CRYS ER 20 MEQ PO TBCR: 20.0000 meq | EXTENDED_RELEASE_TABLET | Freq: Every day | ORAL | 0 refills | Status: DC

## 2016-06-13 NOTE — Telephone Encounter (Signed)
Potassium refilled Please ask patient to come in for f/u labs to check level

## 2016-06-14 ENCOUNTER — Telehealth: Payer: Self-pay | Admitting: Family Medicine

## 2016-06-14 NOTE — Telephone Encounter (Signed)
Patient called the office to speak with nurse regarding the instructions that were given to her. Pt also has questions about her medication. Please follow up.  Thank you.

## 2016-06-14 NOTE — Telephone Encounter (Signed)
Pt was called and informed of medication being refilled. Pt was informed to make a lab visit to get her level checked.

## 2016-06-14 NOTE — Telephone Encounter (Signed)
Pt was called and the l;ine is busy at this time.

## 2016-06-19 ENCOUNTER — Ambulatory Visit: Payer: PPO | Attending: Family Medicine

## 2016-06-19 ENCOUNTER — Telehealth: Payer: Self-pay

## 2016-06-19 DIAGNOSIS — Z13228 Encounter for screening for other metabolic disorders: Secondary | ICD-10-CM | POA: Diagnosis not present

## 2016-06-19 NOTE — Telephone Encounter (Signed)
Patient can speak with VJW in regards to recent lab visit. Patient is to follow instructions given be Dr. Adrian Blackwater in regards to medications.

## 2016-06-20 LAB — MAGNESIUM: MAGNESIUM: 1.8 mg/dL (ref 1.6–2.3)

## 2016-06-20 LAB — BMP8+EGFR
BUN / CREAT RATIO: 13 (ref 12–28)
BUN: 9 mg/dL (ref 8–27)
CHLORIDE: 101 mmol/L (ref 96–106)
CO2: 29 mmol/L (ref 18–29)
Calcium: 9.4 mg/dL (ref 8.7–10.3)
Creatinine, Ser: 0.72 mg/dL (ref 0.57–1.00)
GFR calc non Af Amer: 88 mL/min/{1.73_m2} (ref >59)
GFR, EST AFRICAN AMERICAN: 101 mL/min/{1.73_m2} (ref >59)
GLUCOSE: 80 mg/dL (ref 65–99)
POTASSIUM: 4 mmol/L (ref 3.5–5.2)
Sodium: 142 mmol/L (ref 134–144)

## 2016-06-21 ENCOUNTER — Telehealth: Payer: Self-pay

## 2016-06-21 NOTE — Telephone Encounter (Signed)
Patient verified DOB  Notified patient that lab work was normal and if she was taking any blood thinners, she is to stop taking it per Erline Levine Hammer[employer].

## 2016-06-28 ENCOUNTER — Telehealth: Payer: Self-pay

## 2016-06-28 NOTE — Telephone Encounter (Signed)
-----   Message from Arnoldo Morale, MD sent at 06/20/2016  3:06 PM EDT ----- Please inform the patient that labs are normal. Thank you.

## 2016-06-28 NOTE — Telephone Encounter (Signed)
Writer called patient and discussed lab results.  Patient stated understanding. 

## 2016-07-03 ENCOUNTER — Encounter (HOSPITAL_COMMUNITY): Payer: Self-pay

## 2016-07-03 ENCOUNTER — Emergency Department (HOSPITAL_COMMUNITY): Payer: PPO

## 2016-07-03 ENCOUNTER — Emergency Department (HOSPITAL_COMMUNITY)
Admission: EM | Admit: 2016-07-03 | Discharge: 2016-07-04 | Disposition: A | Discharge status: Home / Self Care | Payer: PPO | Attending: Emergency Medicine | Admitting: Emergency Medicine

## 2016-07-03 DIAGNOSIS — R079 Chest pain, unspecified: Secondary | ICD-10-CM | POA: Diagnosis not present

## 2016-07-03 DIAGNOSIS — R072 Precordial pain: Secondary | ICD-10-CM | POA: Diagnosis not present

## 2016-07-03 DIAGNOSIS — Z79899 Other long term (current) drug therapy: Secondary | ICD-10-CM | POA: Diagnosis not present

## 2016-07-03 DIAGNOSIS — Z7901 Long term (current) use of anticoagulants: Secondary | ICD-10-CM | POA: Insufficient documentation

## 2016-07-03 DIAGNOSIS — Z91040 Latex allergy status: Secondary | ICD-10-CM | POA: Diagnosis not present

## 2016-07-03 DIAGNOSIS — Z87891 Personal history of nicotine dependence: Secondary | ICD-10-CM | POA: Diagnosis not present

## 2016-07-03 DIAGNOSIS — I10 Essential (primary) hypertension: Secondary | ICD-10-CM | POA: Diagnosis not present

## 2016-07-03 DIAGNOSIS — Z7982 Long term (current) use of aspirin: Secondary | ICD-10-CM | POA: Insufficient documentation

## 2016-07-03 LAB — BASIC METABOLIC PANEL
ANION GAP: 9 (ref 5–15)
BUN: 7 mg/dL (ref 6–20)
CHLORIDE: 105 mmol/L (ref 101–111)
CO2: 25 mmol/L (ref 22–32)
CREATININE: 0.71 mg/dL (ref 0.44–1.00)
Calcium: 9.2 mg/dL (ref 8.9–10.3)
GFR calc non Af Amer: >60 mL/min (ref >60)
GLUCOSE: 89 mg/dL (ref 65–99)
Potassium: 4.1 mmol/L (ref 3.5–5.1)
Sodium: 139 mmol/L (ref 135–145)

## 2016-07-03 LAB — CBC
HCT: 40.4 % (ref 36.0–46.0)
HEMOGLOBIN: 13.1 g/dL (ref 12.0–15.0)
MCH: 30.9 pg (ref 26.0–34.0)
MCHC: 32.4 g/dL (ref 30.0–36.0)
MCV: 95.3 fL (ref 78.0–100.0)
Platelets: 277 10*3/uL (ref 150–400)
RBC: 4.24 MIL/uL (ref 3.87–5.11)
RDW: 14.2 % (ref 11.5–15.5)
WBC: 10 10*3/uL (ref 4.0–10.5)

## 2016-07-03 LAB — I-STAT TROPONIN, ED: Troponin i, poc: 0 ng/mL (ref 0.00–0.08)

## 2016-07-03 MED ORDER — IOPAMIDOL (ISOVUE-370) INJECTION 76%
INTRAVENOUS | Status: AC
  Administered 2016-07-03: 100 mL
  Filled 2016-07-03: qty 100

## 2016-07-03 MED ORDER — IBUPROFEN 400 MG PO TABS: 400.0000 mg | ORAL_TABLET | Freq: Four times a day (QID) | ORAL | 0 refills | Status: DC | PRN

## 2016-07-03 MED ORDER — KETOROLAC TROMETHAMINE 30 MG/ML IJ SOLN
15.0000 mg | Freq: Once | INTRAMUSCULAR | Status: AC
  Administered 2016-07-03: 15 mg via INTRAVENOUS
  Filled 2016-07-03: qty 1

## 2016-07-03 MED ORDER — HYDROCODONE-ACETAMINOPHEN 5-325 MG PO TABS: 1.0000 | ORAL_TABLET | ORAL | 0 refills | Status: DC | PRN

## 2016-07-03 NOTE — ED Notes (Signed)
Pt states that chest started hurting right after fall yesterday. Fall on buttocks.

## 2016-07-03 NOTE — ED Notes (Signed)
Patient transported to CT 

## 2016-07-03 NOTE — ED Triage Notes (Signed)
Slipped on ice yesterday morning and is now having left sided chest pain worse on  [employer].

## 2016-07-03 NOTE — ED Notes (Signed)
EDP at bedside. Messner

## 2016-07-04 NOTE — ED Provider Notes (Signed)
[facility] DEPT Provider Note   CSN: [id number] Arrival date & time: 07/03/16  1337     History   Chief Complaint Chief Complaint  Patient presents with  . Fall  . Chest Pain    HPI [name] is a 66 y.o. female.   Chest Pain   This is a new problem. The current episode started 12 to 24 hours ago. The problem occurs constantly. The problem has not changed since onset.The pain is present in the substernal region. The pain is moderate. The quality of the pain is described as brief. The pain does not radiate. The symptoms are aggravated by certain positions.    Past Medical History:  Diagnosis Date  . Abnormal EKG 2012   hospitalized for T wave inversion in lateral leads with MSK chest pains, normal ECHO, negative trops, no cardiology consult. recent EKG 7/15 stable T wave inversions.   . 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 Arthritis 2000  . Heart murmur   . Hyperlipidemia   . Hypertension 2010  . Meniscus tear    L KNEE    Patient Active Problem List   Diagnosis Date Noted  . 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 Essential hypertension   . HLD (hyperlipidemia)   . Aortic atherosclerosis ([place])   . Right-sided low back pain without sciatica 07/08/2015  . 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 Left leg pain 06/14/2015  . Allergic rhinitis 03/09/2015  . Seasonal allergies 03/09/2015  . Abdominal pain 03/09/2015  . Hypopigmentation 03/09/2015  . Pain in both feet 11/10/2014  . Gingivitis 11/10/2014  . Osteoarthritis of left knee 07/08/2014  . DJD (degenerative joint disease), lumbar 07/08/2014  . Osteoarthritis of right knee 07/08/2014  . Foot swelling 06/11/2014  . Urinary frequency 06/11/2014  . Pain due to onychomycosis of toenail 06/11/2014  . Borborygmi 06/11/2014  . Allergic rhinoconjunctivitis of both eyes 06/11/2014  . Nearsightedness 04/27/2014  . Abnormal EKG   . Hypertension 12/11/2013     Past Surgical History:  Procedure Laterality Date  . ABDOMINAL SURGERY  2010   for bowel blockage  . KNEE ARTHROSCOPY Left 09/25/2013   Procedure: LEFT KNEE ARTHROSCOPY WITH PARTIAL MEDIAL AND LATERAL  MENISCECTOMY/DEBRIDEMENT/MICRO FRACTURE MEDIAL FEMORAL CONDYLE, REMOVAL OF OSTEOCONDRAL FRAGMENT;  Surgeon: Johnn Hai, MD;  Location: [facility] ORS;  Service: Orthopedics;  Laterality: Left;  . KNEE SURGERY Right 1999  . TEE WITHOUT CARDIOVERSION N/A 10/04/2015   Procedure: TRANSESOPHAGEAL ECHOCARDIOGRAM (TEE);  Surgeon: Sueanne Margarita, MD;  Location: [facility] ENDOSCOPY;  Service: Cardiovascular;  Laterality: N/A;  . TUBAL LIGATION  1979    OB History    No data available       Home Medications    Prior to Admission medications   Medication Sig Start Date End Date Taking? Authorizing Provider  acetaminophen-codeine (TYLENOL #3) 300-30 MG tablet Take 1 tablet by mouth every 8 (eight) hours as needed for moderate pain. 04/17/16  Yes Josalyn Funches, MD  amLODipine (NORVASC) 5 MG tablet Take 1 tablet (5 mg total) by mouth daily. 05/23/16  Yes Boykin Nearing, MD  aspirin EC 81 MG tablet Take 1 tablet (81 mg total) by mouth daily. 05/16/16  Yes Josalyn Funches, MD  atorvastatin (LIPITOR) 40 MG tablet Take 1 tablet (40 mg total) by mouth daily at 6 PM. 11/09/15  Yes Josalyn Funches, MD  carvedilol (COREG) 12.5 MG tablet Take 1 tablet (12.5 mg total) by mouth 2 (two)  times daily with a meal. 05/23/16  Yes Josalyn Funches, MD  Multiple Vitamin (MULITIVITAMIN WITH MINERALS) TABS Take 1 tablet by mouth daily.   Yes Historical Provider, MD  potassium chloride SA (K-DUR,KLOR-CON) 20 MEQ tablet Take 1 tablet (20 mEq total) by mouth daily. 06/13/16  Yes Josalyn Funches, MD  HYDROcodone-acetaminophen (NORCO/VICODIN) 5-325 MG tablet Take 1 tablet by mouth every 4 (four) hours as needed. 07/03/16   Merrily Pew, MD  ibuprofen (ADVIL,MOTRIN) 400 MG tablet Take 1 tablet (400 mg total) by mouth every 6 (six) hours as  needed. 07/03/16   Merrily Pew, MD  tiZANidine (ZANAFLEX) 4 MG tablet Take 1 tablet (4 mg total) by mouth every 8 (eight) hours as needed for muscle spasms. Patient not taking: Reported on 07/03/2016 10/14/15   Boykin Nearing, MD  warfarin (COUMADIN) 5 MG tablet Take 5 mg by mouth daily. 05/08/16   Historical Provider, MD    Family History Family History  Problem Relation Age of Onset  . Hypertension Mother   . Heart disease Mother     has a pacemaker   . Heart failure Father   . Hypertension Father   . Cancer Neg Hx   . Colon cancer Neg Hx     Social History Social History  Substance Use Topics  . Smoking status: Former Smoker    Quit date: 07/29/2010  . Smokeless tobacco: Never Used  . Alcohol use No     Allergies   Shrimp [shellfish allergy]; Latex; and Penicillins   Review of Systems Review of Systems  Cardiovascular: Positive for chest pain.  All other systems reviewed and are negative.    Physical Exam Updated Vital Signs BP 137/78 (BP Location: Left Arm)   Pulse 72   Temp 98.6 F (37 C) (Oral)   Resp 18   Ht 5\' 5"  (1.651 m)   Wt 150 lb (68 kg)   SpO2 97%   BMI 24.96 kg/m   Physical Exam  Constitutional: She is oriented to person, place, and time. She appears well-developed and well-nourished.  HENT:  Head: Normocephalic and atraumatic.  Eyes: Conjunctivae and EOM are normal.  Neck: Normal range of motion.  Cardiovascular: Normal rate and regular rhythm.   Pulmonary/Chest: No stridor. No respiratory distress.  Abdominal: Soft. She exhibits no distension.  Musculoskeletal: Normal range of motion. She exhibits no edema or deformity.  Neurological: She is alert and oriented to person, place, and time. No cranial nerve deficit.  Skin: Skin is warm and dry.  Nursing note and vitals reviewed.    ED Treatments / Results  Labs (all labs ordered are listed, but only abnormal results are displayed) Labs Reviewed  BASIC METABOLIC PANEL  CBC  OCCULT BLOOD  X 1 CARD TO LAB, STOOL  I-STAT TROPOININ, ED    EKG  EKG Interpretation  Date/Time:  Monday July 03 2016 13:48:17 EDT Ventricular Rate:  61 PR Interval:  148 QRS Duration: 86 QT Interval:  410 QTC Calculation: 412 R Axis:   87 Text Interpretation:  Normal sinus rhythm Cannot rule out Inferior infarct , age undetermined Abnormal ECG no significnat change compared to multiple previous Confirmed by [facility] MD, Kiaya Haliburton [phone]) on 07/03/2016 10:57:06 PM       Radiology Dg Chest 2 View  Result Date: 07/03/2016 CLINICAL DATA:  Pain underneath LEFT breast. EXAM: CHEST  2 VIEW COMPARISON:  04/14/2016. FINDINGS: The heart size and mediastinal contours are within normal limits. Both lungs are clear. The visualized skeletal structures are unremarkable.  IMPRESSION: No active cardiopulmonary disease.  Stable exam. Electronically Signed   By: Staci Righter M.D.   On: 07/03/2016 14:56   Ct Angio Chest Pe W And/or Wo Contrast  Result Date: 07/03/2016 CLINICAL DATA:  Shortness of breath.  Chest pain. EXAM: CT ANGIOGRAPHY CHEST WITH CONTRAST TECHNIQUE: Multidetector CT imaging of the chest was performed using the standard protocol during bolus administration of intravenous contrast. Multiplanar CT image reconstructions and MIPs were obtained to evaluate the vascular anatomy. CONTRAST:  100 mL Isovue 370 IV COMPARISON:  Chest radiograph 07/03/2016 FINDINGS: Cardiovascular: Contrast injection is sufficient to demonstrate satisfactory opacification of the pulmonary arteries to the segmental level. There is no pulmonary embolus. The main pulmonary artery is within normal limits for size. There is no CT evidence of acute right heart strain. There is mild aortic arch atherosclerotic calcification. There is mild coronary artery calcification as well. There is a normal 3-vessel arch branching pattern. Heart size is normal, without pericardial effusion. Mediastinum/Nodes: No mediastinal, hilar or axillary lymphadenopathy.  The visualized thyroid and thoracic esophageal course are unremarkable. Lungs/Pleura: No pulmonary nodules or masses. No pleural effusion or pneumothorax. No focal airspace consolidation. No focal pleural abnormality. Calcified granuloma near the left lung apex. Upper Abdomen: Contrast bolus timing is not optimized for evaluation of the abdominal organs. Within this limitation, the visualized organs of the upper abdomen are normal. Musculoskeletal: No chest wall abnormality. No acute or significant osseous findings. Review of the MIP images confirms the above findings. IMPRESSION: 1. No pulmonary embolus or acute aortic syndrome. 2. Aortic and coronary artery atherosclerosis. 3. No acute pulmonary parenchymal findings or active airspace disease. Electronically Signed   By: Ulyses Jarred M.D.   On: 07/03/2016 23:34    Procedures Procedures (including critical care time)  Medications Ordered in ED Medications  ketorolac (TORADOL) 30 MG/ML injection 15 mg (15 mg Intravenous Given 07/03/16 2223)  iopamidol (ISOVUE-370) 76 % injection (100 mLs  Contrast Given 07/03/16 2249)     Initial Impression / Assessment and Plan / ED Course  I have reviewed the triage vital signs and the nursing notes.  Pertinent labs & imaging results that were available during my care of the patient were reviewed by me and considered in my medical decision making (see chart for details).     Nonspecific chest pain. Very atypical for ACS and negative troponin/ecg. No PE/pneumonia/rib fracture, aorta problem on CT, doubt that as cause. Pain free whole time in ER. Does have some positional component so possibly pericarditis but can't take NSAIDs. Will treat with vicodin and PCP follow up.   Final Clinical Impressions(s) / ED Diagnoses   Final diagnoses:  Nonspecific chest pain    New Prescriptions New Prescriptions   HYDROCODONE-ACETAMINOPHEN (NORCO/VICODIN) 5-325 MG TABLET    Take 1 tablet by mouth every 4 (four) hours as  needed.   IBUPROFEN (ADVIL,MOTRIN) 400 MG TABLET    Take 1 tablet (400 mg total) by mouth every 6 (six) hours as needed.     Merrily Pew, MD 07/04/16 [phone]

## 2016-07-11 ENCOUNTER — Ambulatory Visit: Payer: PPO | Attending: Family Medicine | Admitting: Family Medicine

## 2016-07-11 ENCOUNTER — Encounter: Payer: Self-pay | Admitting: Family Medicine

## 2016-07-11 VITALS — BP 170/82 | HR 88 | Temp 97.4°F | Ht 65.0 in | Wt 163.6 lb

## 2016-07-11 DIAGNOSIS — M47816 Spondylosis without myelopathy or radiculopathy, lumbar region: Secondary | ICD-10-CM

## 2016-07-11 DIAGNOSIS — Z79899 Other long term (current) drug therapy: Secondary | ICD-10-CM | POA: Insufficient documentation

## 2016-07-11 DIAGNOSIS — Z7982 Long term (current) use of aspirin: Secondary | ICD-10-CM | POA: Insufficient documentation

## 2016-07-11 DIAGNOSIS — J301 Allergic rhinitis due to pollen: Secondary | ICD-10-CM | POA: Diagnosis not present

## 2016-07-11 DIAGNOSIS — J302 Other seasonal allergic rhinitis: Secondary | ICD-10-CM | POA: Diagnosis not present

## 2016-07-11 DIAGNOSIS — Z7901 Long term (current) use of anticoagulants: Secondary | ICD-10-CM | POA: Diagnosis not present

## 2016-07-11 DIAGNOSIS — M47896 Other spondylosis, lumbar region: Secondary | ICD-10-CM | POA: Diagnosis not present

## 2016-07-11 DIAGNOSIS — Z87891 Personal history of nicotine dependence: Secondary | ICD-10-CM | POA: Diagnosis not present

## 2016-07-11 DIAGNOSIS — R0789 Other chest pain: Secondary | ICD-10-CM | POA: Diagnosis not present

## 2016-07-11 DIAGNOSIS — I10 Essential (primary) hypertension: Secondary | ICD-10-CM | POA: Diagnosis not present

## 2016-07-11 MED ORDER — LORATADINE 10 MG PO TABS: 10.0000 mg | ORAL_TABLET | Freq: Every day | ORAL | 5 refills | Status: DC

## 2016-07-11 MED ORDER — FLUTICASONE PROPIONATE 50 MCG/ACT NA SUSP: 2.0000 | Freq: Every day | NASAL | 5 refills | Status: DC

## 2016-07-11 MED ORDER — ACETAMINOPHEN-CODEINE #3 300-30 MG PO TABS: 1.0000 | ORAL_TABLET | Freq: Three times a day (TID) | ORAL | 2 refills | Status: DC | PRN

## 2016-07-11 MED ORDER — TIZANIDINE HCL 4 MG PO TABS: 4.0000 mg | ORAL_TABLET | Freq: Three times a day (TID) | ORAL | 0 refills | Status: DC | PRN

## 2016-07-11 NOTE — Progress Notes (Signed)
Subjective:  Patient ID: [name], female    DOB: 11/23/1950  Age: 66 y.o. MRN: [id number]  CC: Hospitalization Follow-up   HPI Kinzy Weyers has HTN, hx of renal infarct s/p 6 months of coumadin (now on daily aspirin indefinitely) she presents for   1. ED follow up fall and chest pain:  She fell outside onto her R side. She slipped on ice. The next day she developed  chest pain for < 2 days. She was treated at [facility] ED on 07/03/2016. Her EKG revealed NSR, I-stat troponin was negative. CBC and metabolic panel were normal. Chest x-ray was negative for acute findings. CT angiogram was negative for PE.   She continues to have soreness in her breast. Soreness is worse with movement. She also have cough that started 1 week ago. Cough is dry. Her chest hurts more when she coughs. She takes tylenol #3 and zanaflex for pain which helps.   2. HTN: she has not taking medication today. No headache or trouble breathing.   Social History  Substance Use Topics  . Smoking status: Former Smoker    Quit date: 07/29/2010  . Smokeless tobacco: Never Used  . Alcohol use No    Outpatient Medications Prior to Visit  Medication Sig Dispense Refill  . acetaminophen-codeine (TYLENOL #3) 300-30 MG tablet Take 1 tablet by mouth every 8 (eight) hours as needed for moderate pain. 60 tablet 2  . amLODipine (NORVASC) 5 MG tablet Take 1 tablet (5 mg total) by mouth daily. 30 tablet 2  . aspirin EC 81 MG tablet Take 1 tablet (81 mg total) by mouth daily. 30 tablet 5  . atorvastatin (LIPITOR) 40 MG tablet Take 1 tablet (40 mg total) by mouth daily at 6 PM. 30 tablet 3  . carvedilol (COREG) 12.5 MG tablet Take 1 tablet (12.5 mg total) by mouth 2 (two) times daily with a meal. 60 tablet 3  . ibuprofen (ADVIL,MOTRIN) 400 MG tablet Take 1 tablet (400 mg total) by mouth every 6 (six) hours as needed. 30 tablet 0  . Multiple Vitamin (MULITIVITAMIN WITH MINERALS) TABS Take 1 tablet by mouth daily.    . potassium  chloride SA (K-DUR,KLOR-CON) 20 MEQ tablet Take 1 tablet (20 mEq total) by mouth daily. 30 tablet 0  . HYDROcodone-acetaminophen (NORCO/VICODIN) 5-325 MG tablet Take 1 tablet by mouth every 4 (four) hours as needed. (Patient not taking: Reported on 07/11/2016) 10 tablet 0  . tiZANidine (ZANAFLEX) 4 MG tablet Take 1 tablet (4 mg total) by mouth every 8 (eight) hours as needed for muscle spasms. (Patient not taking: Reported on 07/03/2016) 30 tablet 2  . warfarin (COUMADIN) 5 MG tablet Take 5 mg by mouth daily.  0   No facility-administered medications prior to visit.     ROS Review of Systems  Constitutional: Negative for chills and fever.  Eyes: Negative for visual disturbance.  Respiratory: Positive for cough. Negative for shortness of breath.   Cardiovascular: Positive for chest pain.  Gastrointestinal: Negative for abdominal pain and blood in stool.  Musculoskeletal: Negative for arthralgias and back pain.  Skin: Negative for rash.  Allergic/Immunologic: Negative for immunocompromised state.  Hematological: Negative for adenopathy. Does not bruise/bleed easily.  Psychiatric/Behavioral: Negative for dysphoric mood and suicidal ideas.    Objective:  BP (!) 170/82   Pulse 88   Temp 97.4 F (36.3 C) (Oral)   Ht 5\' 5"  (1.651 m)   Wt 163 lb 9.6 oz (74.2 kg)   SpO2 99%  BMI 27.22 kg/m   BP/Weight 07/11/2016 3/87/5643 05/26/9516  Systolic BP 841 660 -  Diastolic BP 82 78 -  Wt. (Lbs) 163.6 - 150  BMI 27.22 - 24.96   Physical Exam  Constitutional: She is oriented to person, place, and time. She appears well-developed and well-nourished. No distress.  HENT:  Head: Normocephalic and atraumatic.  Nose: Mucosal edema present.  Cardiovascular: Normal rate, regular rhythm, normal heart sounds and intact distal pulses.   Pulmonary/Chest: Effort normal and breath sounds normal. Chest wall is not dull to percussion. She exhibits no mass, no tenderness, no bony tenderness, no laceration, no  crepitus, no edema, no deformity, no swelling and no retraction. Right breast exhibits no skin change and no tenderness. Left breast exhibits no skin change and no tenderness.  Musculoskeletal: She exhibits no edema.  Neurological: She is alert and oriented to person, place, and time.  Skin: Skin is warm and dry. No rash noted.  Psychiatric: She has a normal mood and affect.   Lab Results  Component Value Date   INR 2.00 04/15/2016   INR 2.4 04/11/2016   INR 1.3 04/06/2016    Assessment & Plan:  Corrinna was seen today for hospitalization follow-up.  Diagnoses and all orders for this visit:  Chest wall pain -     tiZANidine (ZANAFLEX) 4 MG tablet; Take 1 tablet (4 mg total) by mouth every 8 (eight) hours as needed for muscle spasms. -     acetaminophen-codeine (TYLENOL #3) 300-30 MG tablet; Take 1 tablet by mouth every 8 (eight) hours as needed for moderate pain.  Osteoarthritis of lumbar spine, unspecified spinal osteoarthritis complication status -     tiZANidine (ZANAFLEX) 4 MG tablet; Take 1 tablet (4 mg total) by mouth every 8 (eight) hours as needed for muscle spasms. -     acetaminophen-codeine (TYLENOL #3) 300-30 MG tablet; Take 1 tablet by mouth every 8 (eight) hours as needed for moderate pain.  Essential hypertension  Seasonal allergic rhinitis due to pollen -     loratadine (CLARITIN) 10 MG tablet; Take 1 tablet (10 mg total) by mouth daily. -     fluticasone (FLONASE) 50 MCG/ACT nasal spray; Place 2 sprays into both nostrils daily.   There are no diagnoses linked to this encounter.  No orders of the defined types were placed in this encounter.   Follow-up: Return in about 3 weeks (around 08/01/2016) for BP check with clinical pharmacologist .   Boykin Nearing MD

## 2016-07-11 NOTE — Patient Instructions (Addendum)
[name] was seen today for hospitalization follow-up.  Diagnoses and all orders for this visit:  Chest wall pain -     tiZANidine (ZANAFLEX) 4 MG tablet; Take 1 tablet (4 mg total) by mouth every 8 (eight) hours as needed for muscle spasms. -     acetaminophen-codeine (TYLENOL #3) 300-30 MG tablet; Take 1 tablet by mouth every 8 (eight) hours as needed for moderate pain.  Osteoarthritis of lumbar spine, unspecified spinal osteoarthritis complication status -     tiZANidine (ZANAFLEX) 4 MG tablet; Take 1 tablet (4 mg total) by mouth every 8 (eight) hours as needed for muscle spasms. -     acetaminophen-codeine (TYLENOL #3) 300-30 MG tablet; Take 1 tablet by mouth every 8 (eight) hours as needed for moderate pain.  Essential hypertension   Be sure to take BP medicine as your pressure is elevated today  f/u in 3 weeks for BP check with clinical pharmacologist  f/u with me in 3 months for HTN   Dr. Adrian Blackwater

## 2016-07-13 NOTE — Assessment & Plan Note (Signed)
A: HTN uncontrolled Med: none today P: Continue Norvasc 5 mg daily and coreg 12.5 mg daily Close f/u for BP recheck If BP elevated at f/u increase Norvasc to 10 mg daily

## 2016-07-13 NOTE — Assessment & Plan Note (Signed)
Cough causing MSK chest pain Plan: Refilled Flonase and Claritin

## 2016-08-01 ENCOUNTER — Ambulatory Visit: Payer: PPO | Attending: Family Medicine | Admitting: Pharmacist

## 2016-08-01 VITALS — BP 152/73

## 2016-08-01 DIAGNOSIS — Z79899 Other long term (current) drug therapy: Secondary | ICD-10-CM | POA: Insufficient documentation

## 2016-08-01 DIAGNOSIS — I10 Essential (primary) hypertension: Secondary | ICD-10-CM

## 2016-08-01 MED ORDER — AMLODIPINE BESYLATE 10 MG PO TABS: 10.0000 mg | ORAL_TABLET | Freq: Every day | ORAL | 2 refills | Status: DC

## 2016-08-01 NOTE — Patient Instructions (Signed)
Thanks for coming to see [name]!  Increase amlodipine to 10 mg daily.  Follow up with me in 2 weeks for blood pressure

## 2016-08-01 NOTE — Progress Notes (Signed)
   S:    Patient arrives in good spirits.    Presents to the clinic for hypertension evaluation. Patient was referred on 07/11/16.  Patient was last seen by Primary Care Provider on 07/11/16.   Patient reports adherence with medications. She reports that she took her blood pressure medications this morning.  Current BP Medications include:  Amlodipine 5 mg daily, carvedilol 12.5 mg BID  Patient reports checking blood pressure at home with SBP 140-150s.  O:   Last 3 Office BP readings: BP Readings from Last 3 Encounters:  08/01/16 (!) 152/73  07/11/16 (!) 170/82  07/04/16 137/78    BMET    Component Value Date/Time   NA 139 07/03/2016 1400   NA 142 06/19/2016 1443   K 4.1 07/03/2016 1400   CL 105 07/03/2016 1400   CO2 25 07/03/2016 1400   GLUCOSE 89 07/03/2016 1400   BUN 7 07/03/2016 1400   BUN 9 06/19/2016 1443   CREATININE 0.71 07/03/2016 1400   CREATININE 0.74 10/14/2015 0959   CALCIUM 9.2 07/03/2016 1400   GFRNONAA >60 07/03/2016 1400   GFRNONAA 87 12/11/2013 1747   GFRAA >60 07/03/2016 1400   GFRAA >89 12/11/2013 1747    A/P: Hypertension longstanding currently UNcontrolled on current medications.  Increased dose of amlodipine to 10 mg daily.   Results reviewed and written information provided.   Total time in face-to-face counseling 15 minutes.   F/U Clinic Visit with me in 2 weeks for blood pressure check.  Patient seen with Sallyanne Havers, PharmD Candidate

## 2016-08-09 ENCOUNTER — Encounter: Payer: Self-pay | Admitting: Family Medicine

## 2016-08-09 ENCOUNTER — Other Ambulatory Visit: Payer: Self-pay | Admitting: Family Medicine

## 2016-08-09 DIAGNOSIS — E876 Hypokalemia: Secondary | ICD-10-CM

## 2016-08-15 ENCOUNTER — Ambulatory Visit: Payer: PPO | Attending: Family Medicine | Admitting: Pharmacist

## 2016-08-15 VITALS — BP 132/72 | HR 68

## 2016-08-15 DIAGNOSIS — I10 Essential (primary) hypertension: Secondary | ICD-10-CM

## 2016-08-15 DIAGNOSIS — Z79899 Other long term (current) drug therapy: Secondary | ICD-10-CM | POA: Diagnosis not present

## 2016-08-15 NOTE — Patient Instructions (Addendum)
Thanks for coming to see [place]!  Your blood pressure looks great!  Follow up with Dr. Adrian Blackwater in 2 months

## 2016-08-15 NOTE — Progress Notes (Signed)
   S:    Patient arrives in good spirits.    Presents to the clinic for hypertension evaluation. Patient was referred on 07/11/16.  Patient was last seen by Primary Care Provider on 07/11/16.   Patient reports adherence with medications. She reports that she took her blood pressure medications this morning.  Current BP Medications include:  Amlodipine 10 mg daily, carvedilol 12.5 mg BID  Patient reports checking blood pressure at home with SBP <140 O:   Last 3 Office BP readings: BP Readings from Last 3 Encounters:  08/15/16 132/72  08/01/16 (!) 152/73  07/11/16 (!) 170/82    BMET    Component Value Date/Time   NA 139 07/03/2016 1400   NA 142 06/19/2016 1443   K 4.1 07/03/2016 1400   CL 105 07/03/2016 1400   CO2 25 07/03/2016 1400   GLUCOSE 89 07/03/2016 1400   BUN 7 07/03/2016 1400   BUN 9 06/19/2016 1443   CREATININE 0.71 07/03/2016 1400   CREATININE 0.74 10/14/2015 0959   CALCIUM 9.2 07/03/2016 1400   GFRNONAA >60 07/03/2016 1400   GFRNONAA 87 12/11/2013 1747   GFRAA >60 07/03/2016 1400   GFRAA >89 12/11/2013 1747    A/P: Hypertension longstanding currently controlled on current medications. Continue current medications.   Results reviewed and written information provided.   Total time in face-to-face counseling 10 minutes.   F/U Clinic Visit with Dr. Adrian Blackwater in 2 months as scheduled. Patient seen with Sallyanne Havers, PharmD Candidate

## 2016-09-01 ENCOUNTER — Encounter (HOSPITAL_COMMUNITY): Payer: Self-pay | Admitting: Emergency Medicine

## 2016-09-01 ENCOUNTER — Emergency Department (HOSPITAL_COMMUNITY)
Admission: EM | Admit: 2016-09-01 | Discharge: 2016-09-01 | Disposition: A | Discharge status: Home / Self Care | Payer: PPO | Attending: Emergency Medicine | Admitting: Emergency Medicine

## 2016-09-01 DIAGNOSIS — Z91040 Latex allergy status: Secondary | ICD-10-CM | POA: Diagnosis not present

## 2016-09-01 DIAGNOSIS — Z79899 Other long term (current) drug therapy: Secondary | ICD-10-CM | POA: Insufficient documentation

## 2016-09-01 DIAGNOSIS — Z880 Allergy status to penicillin: Secondary | ICD-10-CM | POA: Insufficient documentation

## 2016-09-01 DIAGNOSIS — G5773 Causalgia of bilateral lower limbs: Secondary | ICD-10-CM | POA: Insufficient documentation

## 2016-09-01 DIAGNOSIS — I10 Essential (primary) hypertension: Secondary | ICD-10-CM | POA: Diagnosis not present

## 2016-09-01 DIAGNOSIS — Z7982 Long term (current) use of aspirin: Secondary | ICD-10-CM | POA: Diagnosis not present

## 2016-09-01 DIAGNOSIS — R9431 Abnormal electrocardiogram [ECG] [EKG]: Secondary | ICD-10-CM | POA: Diagnosis not present

## 2016-09-01 DIAGNOSIS — Z87891 Personal history of nicotine dependence: Secondary | ICD-10-CM | POA: Diagnosis not present

## 2016-09-01 DIAGNOSIS — M79605 Pain in left leg: Secondary | ICD-10-CM

## 2016-09-01 DIAGNOSIS — M79604 Pain in right leg: Secondary | ICD-10-CM

## 2016-09-01 DIAGNOSIS — M79606 Pain in leg, unspecified: Secondary | ICD-10-CM | POA: Diagnosis not present

## 2016-09-01 DIAGNOSIS — M79662 Pain in left lower leg: Secondary | ICD-10-CM | POA: Diagnosis not present

## 2016-09-01 DIAGNOSIS — E785 Hyperlipidemia, unspecified: Secondary | ICD-10-CM | POA: Insufficient documentation

## 2016-09-01 DIAGNOSIS — M79661 Pain in right lower leg: Secondary | ICD-10-CM | POA: Diagnosis not present

## 2016-09-01 NOTE — ED Triage Notes (Signed)
Pt presents with multiple complaints. Reports R shoulder pain, bilateral leg pain with ankle swelling, HA, SOB. States this has been going on for past several weeks. A/OX4, ambulatory.

## 2016-09-01 NOTE — ED Provider Notes (Signed)
[facility] DEPT Provider Note   CSN: [id number] Arrival date & time: 09/01/16  2113     History   Chief Complaint Chief Complaint  Patient presents with  . Shortness of Breath  . Leg Pain    HPI [name] is a 66 y.o. female.  Patient with history of HTN, HLD, back pain presents with complaint of bilateral LE "burning" from thighs distally. No swelling or discoloration. No injury. She reports having this pain in the past but never this bad. No change in her usual back pain. Symptoms started 2 weeks ago and are intermittent. No modifying factors. She also reports feeling SOB at times but denies SOB currently or at any time today. No chest pain, abdominal pain, nausea, vomiting, fever. She also reports a frontal headache that comes and goes on a regular basis without sinus symptoms. No headache currently.    The history is provided by the patient. No language interpreter was used.  Shortness of Breath  Associated symptoms include headaches and leg pain. Pertinent negatives include no fever, no sore throat, no cough, no chest pain, no vomiting, no abdominal pain and no leg swelling.  Leg Pain   Pertinent negatives include no numbness.    Past Medical History:  Diagnosis Date  . Abnormal EKG 2012   hospitalized for T wave inversion in lateral leads with MSK chest pains, normal ECHO, negative trops, no cardiology consult. recent EKG 7/15 stable T wave inversions.   . 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 Arthritis 2000  . Heart murmur   . Hyperlipidemia   . Hypertension 2010  . Meniscus tear    L KNEE    Patient Active Problem List   Diagnosis Date Noted  . 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 HLD (hyperlipidemia)   . Aortic atherosclerosis ([place])   . Right-sided low back pain without sciatica 07/08/2015  . 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 Left leg pain 06/14/2015  . Seasonal allergies 03/09/2015  .  Abdominal pain 03/09/2015  . Hypopigmentation 03/09/2015  . Pain in both feet 11/10/2014  . Gingivitis 11/10/2014  . Osteoarthritis of left knee 07/08/2014  . DJD (degenerative joint disease), lumbar 07/08/2014  . Osteoarthritis of right knee 07/08/2014  . Foot swelling 06/11/2014  . Urinary frequency 06/11/2014  . Pain due to onychomycosis of toenail 06/11/2014  . Borborygmi 06/11/2014  . Allergic rhinoconjunctivitis of both eyes 06/11/2014  . Nearsightedness 04/27/2014  . Abnormal EKG   . Hypertension 12/11/2013    Past Surgical History:  Procedure Laterality Date  . ABDOMINAL SURGERY  2010   for bowel blockage  . KNEE ARTHROSCOPY Left 09/25/2013   Procedure: LEFT KNEE ARTHROSCOPY WITH PARTIAL MEDIAL AND LATERAL  MENISCECTOMY/DEBRIDEMENT/MICRO FRACTURE MEDIAL FEMORAL CONDYLE, REMOVAL OF OSTEOCONDRAL FRAGMENT;  Surgeon: Johnn Hai, MD;  Location: [facility];  Service: Orthopedics;  Laterality: Left;  . KNEE SURGERY Right 1999  . TEE WITHOUT CARDIOVERSION N/A 10/04/2015   Procedure: TRANSESOPHAGEAL ECHOCARDIOGRAM (TEE);  Surgeon: Sueanne Margarita, MD;  Location: [facility] ENDOSCOPY;  Service: Cardiovascular;  Laterality: N/A;  . TUBAL LIGATION  1979    OB History    No data available       Home Medications    Prior to Admission medications   Medication Sig Start Date End Date Taking? Authorizing Provider  acetaminophen-codeine (TYLENOL #3) 300-30 MG tablet Take 1 tablet by mouth every 8 (eight) hours as needed for moderate pain. 07/11/16  Funches, Josalyn, MD  amLODipine (NORVASC) 10 MG tablet Take 1 tablet (10 mg total) by mouth daily. 08/01/16   Tresa Garter, MD  aspirin EC 81 MG tablet Take 1 tablet (81 mg total) by mouth daily. 05/16/16   Boykin Nearing, MD  atorvastatin (LIPITOR) 40 MG tablet Take 1 tablet (40 mg total) by mouth daily at 6 PM. 11/09/15   Boykin Nearing, MD  carvedilol (COREG) 12.5 MG tablet Take 1 tablet (12.5 mg total) by mouth 2 (two) times daily with a  meal. 05/23/16   Funches, Josalyn, MD  fluticasone (FLONASE) 50 MCG/ACT nasal spray Place 2 sprays into both nostrils daily. 07/11/16   Funches, Adriana Mccallum, MD  loratadine (CLARITIN) 10 MG tablet Take 1 tablet (10 mg total) by mouth daily. 07/11/16   Funches, Adriana Mccallum, MD  Multiple Vitamin (MULITIVITAMIN WITH MINERALS) TABS Take 1 tablet by mouth daily.    [provider]  potassium chloride SA (K-DUR,KLOR-CON) 20 MEQ tablet TAKE 1 TABLET(20 MEQ) BY MOUTH DAILY 08/10/16   Funches, Adriana Mccallum, MD  tiZANidine (ZANAFLEX) 4 MG tablet Take 1 tablet (4 mg total) by mouth every 8 (eight) hours as needed for muscle spasms. 07/11/16   Boykin Nearing, MD    Family History Family History  Problem Relation Age of Onset  . Hypertension Mother   . Heart disease Mother        has a pacemaker   . Heart failure Father   . Hypertension Father   . Cancer Neg Hx   . Colon cancer Neg Hx     Social History Social History  Substance Use Topics  . Smoking status: Former Smoker    Quit date: 07/29/2010  . Smokeless tobacco: Never Used  . Alcohol use No     Allergies   Shrimp [shellfish allergy]; Latex; and Penicillins   Review of Systems Review of Systems  Constitutional: Negative for chills and fever.  HENT: Negative.  Negative for sinus pain and sore throat.   Respiratory: Positive for shortness of breath. Negative for cough.   Cardiovascular: Negative.  Negative for chest pain and leg swelling.  Gastrointestinal: Negative.  Negative for abdominal pain, nausea and vomiting.  Musculoskeletal:       See HPI.  Skin: Negative.  Negative for color change.  Neurological: Positive for headaches. Negative for weakness and numbness.     Physical Exam Updated Vital Signs BP 138/76   Pulse (!) 59   Temp 98.2 F (36.8 C) (Oral)   Resp 15   Ht 5\' 5"  (1.651 m)   Wt 70.3 kg (155 lb)   SpO2 100%   BMI 25.79 kg/m   Physical Exam  Constitutional: She is oriented to person, place, and time. She  appears well-developed and well-nourished.  HENT:  Head: Normocephalic.  Neck: Normal range of motion. Neck supple.  Cardiovascular: Normal rate and regular rhythm.   No murmur heard. Pulmonary/Chest: Effort normal and breath sounds normal. She has no wheezes. She has no rales. She exhibits no tenderness.  Abdominal: Soft. Bowel sounds are normal. She exhibits no distension. There is no tenderness. There is no rebound and no guarding.  Musculoskeletal: Normal range of motion. She exhibits no edema or tenderness.  Neurological: She is alert and oriented to person, place, and time. No sensory deficit.  Skin: Skin is warm and dry. No rash noted. No erythema.  Psychiatric: She has a normal mood and affect.     ED Treatments / Results  Labs (all labs ordered are  listed, but only abnormal results are displayed) Labs Reviewed - No data to display  EKG  EKG Interpretation None       Radiology No results found.  Procedures Procedures (including critical care time)  Medications Ordered in ED Medications - No data to display   Initial Impression / Assessment and Plan / ED Course  I have reviewed the triage vital signs and the nursing notes.  Pertinent labs & imaging results that were available during my care of the patient were reviewed by me and considered in my medical decision making (see chart for details).     The patient has multiple complaints but does not have any symptoms currently. She is a vague historian. She takes Tylenol #3 and Zanaflex for her chronic back pain and this helps with her LE discomfort. No neurologic abnormalities on exam. No chest pain, no SOB.   VSS. She is felt appropriate for discharge home and is strongly encouraged to follow up with her doctor for further outpatient evaluation of multiple complaints.   Final Clinical Impressions(s) / ED Diagnoses   Final diagnoses:  None   1. Bilateral LE pain  New Prescriptions New Prescriptions   No  medications on file     Dennie Bible 09/01/16 2307    Pattricia Boss, MD 09/20/16 [phone]

## 2016-09-21 ENCOUNTER — Other Ambulatory Visit: Payer: Self-pay | Admitting: Family Medicine

## 2016-09-21 DIAGNOSIS — R0789 Other chest pain: Secondary | ICD-10-CM

## 2016-09-21 DIAGNOSIS — M47816 Spondylosis without myelopathy or radiculopathy, lumbar region: Secondary | ICD-10-CM

## 2016-09-25 ENCOUNTER — Other Ambulatory Visit: Payer: Self-pay | Admitting: Family Medicine

## 2016-09-25 NOTE — Telephone Encounter (Signed)
Carvedilol and atorvastatin were refilled 09/21/16. Will defer to Dr. Adrian Blackwater for Zanaflex and Tylenol #3.

## 2016-09-25 NOTE — Telephone Encounter (Signed)
Patient called back to check on status of Tylenol #3 and Zanaflex , please f/up

## 2016-09-25 NOTE — Telephone Encounter (Signed)
Patient requesting refills for   Zanaflex Tylenol #3 Carvedilol Cholesterol medicine  Please advised

## 2016-09-26 NOTE — Telephone Encounter (Signed)
Pt called to again to request the refill for her med, please follow up

## 2016-09-26 NOTE — Telephone Encounter (Signed)
  Reviewed [facility] Controlled Substance Reporting System. No unauthorized fills of controlled medications  Patient needs to sign controlled substance contract, no on file.  She will need f/u appt before next refill

## 2016-09-28 NOTE — Telephone Encounter (Signed)
Pt called and was informed of script being ready for pick up.

## 2016-10-12 ENCOUNTER — Ambulatory Visit: Payer: PPO | Attending: Family Medicine | Admitting: Family Medicine

## 2016-10-12 ENCOUNTER — Encounter: Payer: Self-pay | Admitting: Family Medicine

## 2016-10-12 VITALS — BP 114/67 | HR 64 | Temp 97.5°F | Ht 65.0 in | Wt 161.4 lb

## 2016-10-12 DIAGNOSIS — E785 Hyperlipidemia, unspecified: Secondary | ICD-10-CM | POA: Insufficient documentation

## 2016-10-12 DIAGNOSIS — Z1159 Encounter for screening for other viral diseases: Secondary | ICD-10-CM | POA: Diagnosis not present

## 2016-10-12 DIAGNOSIS — Z1231 Encounter for screening mammogram for malignant neoplasm of breast: Secondary | ICD-10-CM

## 2016-10-12 DIAGNOSIS — N280 Ischemia and infarction of kidney: Secondary | ICD-10-CM | POA: Diagnosis not present

## 2016-10-12 DIAGNOSIS — E2839 Other primary ovarian failure: Secondary | ICD-10-CM | POA: Diagnosis not present

## 2016-10-12 DIAGNOSIS — Z79899 Other long term (current) drug therapy: Secondary | ICD-10-CM | POA: Diagnosis not present

## 2016-10-12 DIAGNOSIS — M79604 Pain in right leg: Secondary | ICD-10-CM | POA: Diagnosis not present

## 2016-10-12 DIAGNOSIS — I10 Essential (primary) hypertension: Secondary | ICD-10-CM | POA: Diagnosis not present

## 2016-10-12 DIAGNOSIS — Z87891 Personal history of nicotine dependence: Secondary | ICD-10-CM | POA: Insufficient documentation

## 2016-10-12 DIAGNOSIS — M79605 Pain in left leg: Secondary | ICD-10-CM | POA: Insufficient documentation

## 2016-10-12 DIAGNOSIS — Z7982 Long term (current) use of aspirin: Secondary | ICD-10-CM | POA: Insufficient documentation

## 2016-10-12 MED ORDER — AMLODIPINE BESYLATE 10 MG PO TABS: 10.0000 mg | ORAL_TABLET | Freq: Every day | ORAL | 11 refills | Status: DC

## 2016-10-12 MED ORDER — ATORVASTATIN CALCIUM 40 MG PO TABS: ORAL_TABLET | ORAL | 11 refills | Status: DC

## 2016-10-12 MED ORDER — CARVEDILOL 12.5 MG PO TABS: ORAL_TABLET | ORAL | 11 refills | Status: DC

## 2016-10-12 MED ORDER — ASPIRIN EC 81 MG PO TBEC: 81.0000 mg | DELAYED_RELEASE_TABLET | Freq: Every day | ORAL | 11 refills | Status: DC

## 2016-10-12 NOTE — Assessment & Plan Note (Signed)
Intermittent anterior leg pains Possible statin induced Possible lumbar radiculopathy No weakness No calve tenderness or swelling to suggest DVT  Plan: Reassurance

## 2016-10-12 NOTE — Progress Notes (Signed)
Subjective:  Patient ID: [name], female    DOB: 1950/08/21  Age: 66 y.o. MRN: [id number]  CC: Hypertension   HPI Fallon Haecker presents for   1. CHRONIC HYPERTENSION  Disease Monitoring  Blood pressure range: not checking   Chest pain: no   Dyspnea: no   Claudication: no   Medication compliance: yes  Medication Side Effects  Lightheadedness: no   Urinary frequency: no   Edema: yes, in ankles      2. ED follow up leg pain: she was seen at [facility] Emergency Department on 09/01/2016 for leg pain. She reported bilateral leg pain for thigh down. The was no rash, swelling or trauma. She was advised to continue Zanaflex and tylenol #3 for the pain. Today she reports intermittent pain in both legs. Mostly anterior legs.  Denies calf pain. She also has intermittent pain in her back.   3. Healthcare maintenance: she declines pneumovax today. She is amenable to hep C screen. She is amenable to bone density scan. Her next screening mammogram is due in 02/2017.  Social History  Substance Use Topics  . Smoking status: Former Smoker    Quit date: 07/29/2010  . Smokeless tobacco: Never Used  . Alcohol use No    Outpatient Medications Prior to Visit  Medication Sig Dispense Refill  . acetaminophen-codeine (TYLENOL #3) 300-30 MG tablet TAKE 1 TABLET BY MOUTH EVERY 8 HOURS AS NEEDED FOR MODERATE PAIN 60 tablet 0  . amLODipine (NORVASC) 10 MG tablet Take 1 tablet (10 mg total) by mouth daily. 30 tablet 2  . aspirin EC 81 MG tablet Take 1 tablet (81 mg total) by mouth daily. 30 tablet 5  . atorvastatin (LIPITOR) 40 MG tablet TAKE 1 TABLET(40 MG) BY MOUTH DAILY AT 6 PM 30 tablet 2  . carvedilol (COREG) 12.5 MG tablet TAKE 1 TABLET(12.5 MG) BY MOUTH TWICE DAILY WITH A MEAL 60 tablet 2  . fluticasone (FLONASE) 50 MCG/ACT nasal spray Place 2 sprays into both nostrils daily. 16 g 5  . loratadine (CLARITIN) 10 MG tablet Take 1 tablet (10 mg total) by mouth daily. 30 tablet 5  . Multiple  Vitamin (MULITIVITAMIN WITH MINERALS) TABS Take 1 tablet by mouth daily.    . potassium chloride SA (K-DUR,KLOR-CON) 20 MEQ tablet TAKE 1 TABLET(20 MEQ) BY MOUTH DAILY 30 tablet 2  . tiZANidine (ZANAFLEX) 4 MG tablet TAKE 1 TABLET(4 MG) BY MOUTH EVERY 8 HOURS AS NEEDED FOR MUSCLE SPASMS 30 tablet 0   No facility-administered medications prior to visit.     ROS Review of Systems  Constitutional: Negative for chills and fever.  Eyes: Negative for visual disturbance.  Respiratory: Negative for shortness of breath.   Cardiovascular: Negative for chest pain.  Gastrointestinal: Negative for abdominal pain and blood in stool.  Musculoskeletal: Positive for arthralgias, back pain and myalgias.  Skin: Negative for rash.  Allergic/Immunologic: Negative for immunocompromised state.  Hematological: Negative for adenopathy. Does not bruise/bleed easily.  Psychiatric/Behavioral: Negative for dysphoric mood and suicidal ideas.    Objective:  BP 114/67   Pulse 64   Temp (!) 97.5 F (36.4 C) (Oral)   Ht 5\' 5"  (1.651 m)   Wt 161 lb 6.4 oz (73.2 kg)   SpO2 98%   BMI 26.86 kg/m   BP/Weight 10/12/2016 09/01/2016 3/38/2505  Systolic BP 397 673 419  Diastolic BP 67 68 72  Wt. (Lbs) 161.4 155 -  BMI 26.86 25.79 -     Physical Exam  Constitutional:  She is oriented to person, place, and time. She appears well-developed and well-nourished. No distress.  HENT:  Head: Normocephalic and atraumatic.  Neck: Carotid bruit is not present.  Cardiovascular: Normal rate, regular rhythm, normal heart sounds and intact distal pulses.   Pulses:      Dorsalis pedis pulses are 2+ on the right side, and 2+ on the left side.  Pulmonary/Chest: Effort normal and breath sounds normal.  Musculoskeletal: She exhibits edema (mild around both ankles ).  Neurological: She is alert and oriented to person, place, and time.  Skin: Skin is warm and dry. No rash noted.  Psychiatric: She has a normal mood and affect.   Lab  Results  Component Value Date   HGBA1C 5.7 04/17/2016    Assessment & Plan:  Juelle was seen today for hypertension.  Diagnoses and all orders for this visit:  Essential hypertension -     carvedilol (COREG) 12.5 MG tablet; TAKE 1 TABLET(12.5 MG) BY MOUTH TWICE DAILY WITH A MEAL -     amLODipine (NORVASC) 10 MG tablet; Take 1 tablet (10 mg total) by mouth daily.  Renal infarct (H[facility]) -     aspirin EC 81 MG tablet; Take 1 tablet (81 mg total) by mouth daily.  Need for hepatitis C screening test -     Hepatitis c antibody (reflex)  Leg pain, bilateral  Hyperlipidemia, unspecified hyperlipidemia type -     atorvastatin (LIPITOR) 40 MG tablet; TAKE 1 TABLET(40 MG) BY MOUTH DAILY AT 6 PM -     Lipid Panel  Visit for screening mammogram -     MM DIGITAL SCREENING BILATERAL; Future  Estrogen deficiency -     DG Bone Density; Future   There are no diagnoses linked to this encounter.  No orders of the defined types were placed in this encounter.   Follow-up: Return in about 3 months (around 01/12/2017) for HTN and flu shot.   Boykin Nearing MD

## 2016-10-12 NOTE — Assessment & Plan Note (Signed)
Well controlled Suspect ankle swelling due to Norvasc Patient advised She will continue for now but knows she can change if swelling becomes painful

## 2016-10-12 NOTE — Patient Instructions (Addendum)
Tanisia was seen today for hypertension.  Diagnoses and all orders for this visit:  Essential hypertension -     carvedilol (COREG) 12.5 MG tablet; TAKE 1 TABLET(12.5 MG) BY MOUTH TWICE DAILY WITH A MEAL -     amLODipine (NORVASC) 10 MG tablet; Take 1 tablet (10 mg total) by mouth daily.  Renal infarct (HCC) -     aspirin EC 81 MG tablet; Take 1 tablet (81 mg total) by mouth daily.  Need for hepatitis C screening test -     Hepatitis c antibody (reflex)  Leg pain, bilateral  Hyperlipidemia, unspecified hyperlipidemia type -     atorvastatin (LIPITOR) 40 MG tablet; TAKE 1 TABLET(40 MG) BY MOUTH DAILY AT 6 PM -     Lipid Panel  Visit for screening mammogram -     MM DIGITAL SCREENING BILATERAL; Future  Estrogen deficiency -     DG Bone Density; Future   Since I am drawing blood I would also like to check your lipid panel the last one was last month  Plan to have screening mammogram and bone density scan in 02/2017   F/u in 3 months for HTN and flu shot  Dr. Adrian Blackwater

## 2016-10-12 NOTE — Assessment & Plan Note (Signed)
Taking lipitor This has been refilled Also obtaining lipid panel

## 2016-10-13 LAB — LIPID PANEL
CHOLESTEROL TOTAL: 190 mg/dL (ref 100–199)
Chol/HDL Ratio: 3.2 ratio (ref 0.0–4.4)
HDL: 60 mg/dL (ref >39)
LDL CALC: 103 mg/dL — AB (ref 0–99)
Triglycerides: 133 mg/dL (ref 0–149)
VLDL Cholesterol Cal: 27 mg/dL (ref 5–40)

## 2016-10-13 LAB — HCV COMMENT:

## 2016-10-13 LAB — HEPATITIS C ANTIBODY (REFLEX): HCV Ab: <0.1 s/co ratio (ref 0.0–0.9)

## 2016-10-18 ENCOUNTER — Telehealth: Payer: Self-pay

## 2016-10-18 NOTE — Telephone Encounter (Signed)
Pt was called and informed of lab results. 

## 2016-10-24 ENCOUNTER — Ambulatory Visit: Payer: PPO | Attending: Family Medicine | Admitting: Physician Assistant

## 2016-10-24 ENCOUNTER — Encounter: Payer: Self-pay | Admitting: Physician Assistant

## 2016-10-24 VITALS — BP 137/65 | HR 65 | Temp 98.2°F | Resp 18 | Ht 65.0 in | Wt 167.0 lb

## 2016-10-24 DIAGNOSIS — M25561 Pain in right knee: Secondary | ICD-10-CM | POA: Insufficient documentation

## 2016-10-24 DIAGNOSIS — R609 Edema, unspecified: Secondary | ICD-10-CM

## 2016-10-24 DIAGNOSIS — M25562 Pain in left knee: Secondary | ICD-10-CM | POA: Insufficient documentation

## 2016-10-24 DIAGNOSIS — I10 Essential (primary) hypertension: Secondary | ICD-10-CM

## 2016-10-24 MED ORDER — MELOXICAM 7.5 MG PO TABS: 7.5000 mg | ORAL_TABLET | Freq: Every day | ORAL | 0 refills | Status: DC

## 2016-10-24 MED ORDER — FUROSEMIDE 20 MG PO TABS: 20.0000 mg | ORAL_TABLET | Freq: Every day | ORAL | 0 refills | Status: DC

## 2016-10-24 NOTE — Progress Notes (Signed)
Patient ID: [name], female   DOB: Aug 02, 1950, 66 y.o.   MRN: [id number] C/o B knee pain X ~1 week.  Tylenol #3 not helping.  She has had B knee surgery performed by [facility] in the past.  [facility] recently.  Both knees feel swollen to her. No f/c.  Also c/o lower leg swelling.  No SOB.  No calf pain.  No CP.

## 2016-10-25 LAB — BASIC METABOLIC PANEL
BUN/Creatinine Ratio: 9 — ABNORMAL LOW (ref 12–28)
BUN: 8 mg/dL (ref 8–27)
CO2: 25 mmol/L (ref 20–29)
Calcium: 9.3 mg/dL (ref 8.7–10.3)
Chloride: 103 mmol/L (ref 96–106)
Creatinine, Ser: 0.92 mg/dL (ref 0.57–1.00)
GFR calc Af Amer: 75 mL/min/{1.73_m2} (ref >59)
GFR calc non Af Amer: 65 mL/min/{1.73_m2} (ref >59)
Glucose: 65 mg/dL (ref 65–99)
Potassium: 4 mmol/L (ref 3.5–5.2)
Sodium: 143 mmol/L (ref 134–144)

## 2016-10-31 ENCOUNTER — Other Ambulatory Visit: Payer: Self-pay | Admitting: Family Medicine

## 2016-10-31 ENCOUNTER — Other Ambulatory Visit: Payer: Self-pay | Admitting: Internal Medicine

## 2016-10-31 DIAGNOSIS — M47816 Spondylosis without myelopathy or radiculopathy, lumbar region: Secondary | ICD-10-CM

## 2016-10-31 DIAGNOSIS — R0789 Other chest pain: Secondary | ICD-10-CM

## 2016-10-31 NOTE — Telephone Encounter (Signed)
  Reviewed [facility] Controlled Substance Reporting System. No unauthorized fills of controlled medications Tylenol #3 ready for pick up

## 2016-11-07 ENCOUNTER — Other Ambulatory Visit: Payer: Self-pay | Admitting: Family Medicine

## 2016-11-07 DIAGNOSIS — M47816 Spondylosis without myelopathy or radiculopathy, lumbar region: Secondary | ICD-10-CM

## 2016-11-07 DIAGNOSIS — R0789 Other chest pain: Secondary | ICD-10-CM

## 2016-11-08 NOTE — Telephone Encounter (Signed)
Pt called to request a refill for  acetaminophen-codeine (TYLENOL #3) 300-30 MG tablet  She is very upset since she did not got any refill set up until she see the new pcp on 01/26/17  If you can auth or not this refill please call Pt

## 2016-11-10 ENCOUNTER — Other Ambulatory Visit: Payer: Self-pay | Admitting: Internal Medicine

## 2016-11-10 ENCOUNTER — Telehealth: Payer: Self-pay | Admitting: Family Medicine

## 2016-11-10 NOTE — Telephone Encounter (Signed)
-----   Message from [place] Donovan, [place] sent at 10/26/2016  9:03 AM EDT ----- Please call patient.  Her labs were ok.  Orthopedics referral in process.  Follow-up as planned.  Thanks, Freeman Caldron, PA-C

## 2016-11-10 NOTE — Telephone Encounter (Signed)
Medical Assistant left message on patient's home and cell voicemail. Voicemail states to give a call back to [place] with [facility] at [phone].  !!!Please inform patient of prescription being placed at the front for pickup. Patient was referred to [facility] orthopedic for knee concern!!!

## 2016-11-10 NOTE — Telephone Encounter (Signed)
Pt called to request a refill for her acetaminophen-codeine (TYLENOL #3) 300-30 MG tablet °Please follow up °

## 2016-11-16 ENCOUNTER — Emergency Department (HOSPITAL_COMMUNITY): Payer: PPO

## 2016-11-16 ENCOUNTER — Telehealth: Payer: Self-pay | Admitting: Internal Medicine

## 2016-11-16 ENCOUNTER — Other Ambulatory Visit: Payer: Self-pay

## 2016-11-16 ENCOUNTER — Encounter (HOSPITAL_COMMUNITY): Payer: Self-pay

## 2016-11-16 DIAGNOSIS — Z87891 Personal history of nicotine dependence: Secondary | ICD-10-CM | POA: Diagnosis not present

## 2016-11-16 DIAGNOSIS — M25511 Pain in right shoulder: Secondary | ICD-10-CM | POA: Diagnosis not present

## 2016-11-16 DIAGNOSIS — Z7982 Long term (current) use of aspirin: Secondary | ICD-10-CM | POA: Insufficient documentation

## 2016-11-16 DIAGNOSIS — Z91040 Latex allergy status: Secondary | ICD-10-CM | POA: Insufficient documentation

## 2016-11-16 DIAGNOSIS — R05 Cough: Secondary | ICD-10-CM | POA: Diagnosis not present

## 2016-11-16 DIAGNOSIS — I10 Essential (primary) hypertension: Secondary | ICD-10-CM | POA: Diagnosis not present

## 2016-11-16 DIAGNOSIS — Z79899 Other long term (current) drug therapy: Secondary | ICD-10-CM | POA: Diagnosis not present

## 2016-11-16 DIAGNOSIS — J189 Pneumonia, unspecified organism: Secondary | ICD-10-CM | POA: Insufficient documentation

## 2016-11-16 DIAGNOSIS — R079 Chest pain, unspecified: Secondary | ICD-10-CM | POA: Diagnosis not present

## 2016-11-16 DIAGNOSIS — R609 Edema, unspecified: Secondary | ICD-10-CM

## 2016-11-16 LAB — BASIC METABOLIC PANEL
Anion gap: 10 (ref 5–15)
BUN: 8 mg/dL (ref 6–20)
CHLORIDE: 106 mmol/L (ref 101–111)
CO2: 21 mmol/L — AB (ref 22–32)
CREATININE: 0.81 mg/dL (ref 0.44–1.00)
Calcium: 8.7 mg/dL — ABNORMAL LOW (ref 8.9–10.3)
GFR calc Af Amer: >60 mL/min (ref >60)
GFR calc non Af Amer: >60 mL/min (ref >60)
GLUCOSE: 126 mg/dL — AB (ref 65–99)
Potassium: 3.5 mmol/L (ref 3.5–5.1)
Sodium: 137 mmol/L (ref 135–145)

## 2016-11-16 LAB — CBC
HCT: 38.8 % (ref 36.0–46.0)
Hemoglobin: 12.5 g/dL (ref 12.0–15.0)
MCH: 30 pg (ref 26.0–34.0)
MCHC: 32.2 g/dL (ref 30.0–36.0)
MCV: 93 fL (ref 78.0–100.0)
PLATELETS: 287 10*3/uL (ref 150–400)
RBC: 4.17 MIL/uL (ref 3.87–5.11)
RDW: 14.2 % (ref 11.5–15.5)
WBC: 10.9 10*3/uL — ABNORMAL HIGH (ref 4.0–10.5)

## 2016-11-16 LAB — TROPONIN I: Troponin I: <0.03 ng/mL (ref <0.03)

## 2016-11-16 NOTE — Telephone Encounter (Signed)
Pt came to the office requesting a refill for furosemide (LASIX) 20 MG tablet  Since she only got for 7 day, she need more since stil her leg still swollen please follow up

## 2016-11-16 NOTE — ED Triage Notes (Signed)
Pt endorses central chest pain with right shoulder pain x 2 days. Hx of HTN. VSS. Denies shob.

## 2016-11-16 NOTE — Telephone Encounter (Signed)
This was not intended to be a long term medication - patient needs office visit for further evaluation if she is still having swelling. Please schedule.

## 2016-11-17 ENCOUNTER — Emergency Department (HOSPITAL_COMMUNITY)
Admission: EM | Admit: 2016-11-17 | Discharge: 2016-11-17 | Disposition: A | Discharge status: Home / Self Care | Payer: PPO | Attending: Physician Assistant | Admitting: Physician Assistant

## 2016-11-17 ENCOUNTER — Telehealth: Payer: Self-pay | Admitting: Family Medicine

## 2016-11-17 DIAGNOSIS — J189 Pneumonia, unspecified organism: Secondary | ICD-10-CM

## 2016-11-17 MED ORDER — DOXYCYCLINE HYCLATE 100 MG PO CAPS: 100.0000 mg | ORAL_CAPSULE | Freq: Two times a day (BID) | ORAL | 0 refills | Status: AC

## 2016-11-17 MED ORDER — DOXYCYCLINE HYCLATE 100 MG PO TABS
100.0000 mg | ORAL_TABLET | Freq: Once | ORAL | Status: AC
  Administered 2016-11-17: 100 mg via ORAL
  Filled 2016-11-17: qty 1

## 2016-11-17 NOTE — ED Notes (Signed)
Pt given gingerale for fluid challenge. 

## 2016-11-17 NOTE — Telephone Encounter (Signed)
Patient called requesting medication

## 2016-11-17 NOTE — Discharge Instructions (Signed)
We want you to be sure to follow up with your primary care physician. Your pneumonia looks atypical and so we want you to follow up with your PCP and get a repeat chest x-ray to make sure  it goes away after treatment.   Be sure to drink plenty of fluids and rest.  REturn with any concerning symtpoms

## 2016-11-17 NOTE — ED Provider Notes (Signed)
[facility] DEPT Provider Note   CSN: [id number] Arrival date & time: 11/16/16  2200     History   Chief Complaint Chief Complaint  Patient presents with  . Chest Pain  . Shoulder Pain    HPI [name] is a 66 y.o. female.  HPI   Patient is a 66 year old female presenting with right shoulder pain. Patient reports a right shoulder pain is worse when she moves her right arm. She feels like it's a "muscle spasm". She also reports she occasionally has diarrhea. She occasionally has cough. She reports the most most of the symptoms have been going on for the last 5 days.  Past Medical History:  Diagnosis Date  . Abnormal EKG 2012   hospitalized for T wave inversion in lateral leads with [facility]K chest pains, normal ECHO, negative trops, no cardiology consult. recent EKG 7/15 stable T wave inversions.   . 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 Arthritis 2000  . Heart murmur   . Hyperlipidemia   . Hypertension 2010  . Meniscus tear    L KNEE    Patient Active Problem List   Diagnosis Date Noted  . 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 HLD (hyperlipidemia)   . Aortic atherosclerosis ([place])   . Right-sided low back pain without sciatica 07/08/2015  . 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 Bilateral leg pain 06/14/2015  . Seasonal allergies 03/09/2015  . Hypopigmentation 03/09/2015  . Gingivitis 11/10/2014  . Osteoarthritis of left knee 07/08/2014  . DJD (degenerative joint disease), lumbar 07/08/2014  . Osteoarthritis of right knee 07/08/2014  . Foot swelling 06/11/2014  . Pain due to onychomycosis of toenail 06/11/2014  . Borborygmi 06/11/2014  . Allergic rhinoconjunctivitis of both eyes 06/11/2014  . Nearsightedness 04/27/2014  . Abnormal EKG   . Hypertension 12/11/2013    Past Surgical History:  Procedure Laterality Date  . ABDOMINAL SURGERY  2010   for bowel blockage  . KNEE ARTHROSCOPY Left 09/25/2013   Procedure: LEFT KNEE ARTHROSCOPY WITH PARTIAL MEDIAL AND LATERAL  MENISCECTOMY/DEBRIDEMENT/MICRO FRACTURE MEDIAL FEMORAL CONDYLE, REMOVAL OF OSTEOCONDRAL FRAGMENT;  Surgeon: Johnn Hai, MD;  Location: [facility] ORS;  Service: Orthopedics;  Laterality: Left;  . KNEE SURGERY Right 1999  . TEE WITHOUT CARDIOVERSION N/A 10/04/2015   Procedure: TRANSESOPHAGEAL ECHOCARDIOGRAM (TEE);  Surgeon: Sueanne Margarita, MD;  Location: [facility] ENDOSCOPY;  Service: Cardiovascular;  Laterality: N/A;  . TUBAL LIGATION  1979    OB History    No data available       Home Medications    Prior to Admission medications   Medication Sig Start Date End Date Taking? Authorizing Provider  acetaminophen-codeine (TYLENOL #3) 300-30 MG tablet TAKE 1 TABLET BY MOUTH EVERY 8 HOURS AS NEEDED FOR MODERATE PAIN 11/09/16   Tresa Garter, MD  amLODipine (NORVASC) 10 MG tablet Take 1 tablet (10 mg total) by mouth daily. 10/12/16   Boykin Nearing, MD  aspirin EC 81 MG tablet Take 1 tablet (81 mg total) by mouth daily. 10/12/16   Funches, Adriana Mccallum, MD  atorvastatin (LIPITOR) 40 MG tablet TAKE 1 TABLET(40 MG) BY MOUTH DAILY AT 6 PM 10/12/16   Funches, Josalyn, MD  carvedilol (COREG) 12.5 MG tablet TAKE 1 TABLET(12.5 MG) BY MOUTH TWICE DAILY WITH A MEAL 10/12/16   Funches, Josalyn, MD  fluticasone (FLONASE) 50 MCG/ACT nasal spray Place 2 sprays into both nostrils daily. 07/11/16   Boykin Nearing, MD  furosemide (LASIX) 20  MG tablet Take 1 tablet (20 mg total) by mouth daily. 10/24/16   Argentina Donovan, PA-C  loratadine (CLARITIN) 10 MG tablet Take 1 tablet (10 mg total) by mouth daily. 07/11/16   Funches, Adriana Mccallum, MD  meloxicam (MOBIC) 7.5 MG tablet Take 1 tablet (7.5 mg total) by mouth daily. 10/24/16   Argentina Donovan, PA-C  Multiple Vitamin (MULITIVITAMIN WITH MINERALS) TABS Take 1 tablet by mouth daily.    [provider]  potassium chloride SA (K-DUR,KLOR-CON) 20 MEQ tablet TAKE 1 TABLET(20 MEQ) BY MOUTH DAILY 08/10/16    Funches, Josalyn, MD  tiZANidine (ZANAFLEX) 4 MG tablet TAKE 1 TABLET(4 MG) BY MOUTH EVERY 8 HOURS AS NEEDED FOR MUSCLE SPASMS 09/26/16   Boykin Nearing, MD    Family History Family History  Problem Relation Age of Onset  . Hypertension Mother   . Heart disease Mother        has a pacemaker   . Heart failure Father   . Hypertension Father   . Cancer Neg Hx   . Colon cancer Neg Hx     Social History Social History  Substance Use Topics  . Smoking status: Former Smoker    Quit date: 07/29/2010  . Smokeless tobacco: Never Used  . Alcohol use No     Allergies   Shrimp [shellfish allergy]; Latex; and Penicillins   Review of Systems Review of Systems  Constitutional: Positive for fatigue. Negative for activity change and fever.  Respiratory: Positive for cough. Negative for chest tightness and shortness of breath.   Cardiovascular: Negative for chest pain.  Gastrointestinal: Negative for abdominal pain.     Physical Exam Updated Vital Signs BP 134/69 (BP Location: Right Arm)   Pulse 66   Temp (!) 97.3 F (36.3 C) (Oral)   Resp 16   Ht 5\' 5"  (1.651 m)   Wt 72.6 kg (160 lb)   SpO2 98%   BMI 26.63 kg/m   Physical Exam  Constitutional: She is oriented to person, place, and time. She appears well-developed and well-nourished.  HENT:  Head: Normocephalic and atraumatic.  Eyes: Right eye exhibits no discharge.  Cardiovascular: Normal rate, regular rhythm and normal heart sounds.   No murmur heard. Pulmonary/Chest: Effort normal and breath sounds normal. She has no wheezes. She has no rales.  Abdominal: Soft. She exhibits no distension. There is no tenderness.  Neurological: She is oriented to person, place, and time.  Skin: Skin is warm and dry. She is not diaphoretic.  Psychiatric: She has a normal mood and affect.  Nursing note and vitals reviewed.    ED Treatments / Results  Labs (all labs ordered are listed, but only abnormal results are displayed) Labs  Reviewed  BASIC METABOLIC PANEL - Abnormal; Notable for the following:       Result Value   CO2 21 (*)    Glucose, Bld 126 (*)    Calcium 8.7 (*)    All other components within normal limits  CBC - Abnormal; Notable for the following:    WBC 10.9 (*)    All other components within normal limits  TROPONIN I    EKG  EKG Interpretation None       Radiology Dg Chest 2 View  Result Date: 11/16/2016 CLINICAL DATA:  Chest pain, productive cough EXAM: CHEST  2 VIEW COMPARISON:  CTA chest dated 07/03/2016 FINDINGS: Mild patchy opacities in the right upper lobe and bilateral lower lobes, right greater than left. No pleural effusion or pneumothorax.  The heart is normal size. Visualized osseous structures are within normal limits. IMPRESSION: Mild multifocal patchy opacities, right lower lobe predominant, suspicious for pneumonia. Electronically Signed   By: Julian Hy M.D.   On: 11/16/2016 22:48    Procedures Procedures (including critical care time)  Medications Ordered in ED Medications  doxycycline (VIBRA-TABS) tablet 100 mg (not administered)     Initial Impression / Assessment and Plan / ED Course  I have reviewed the triage vital signs and the nursing notes.  Pertinent labs & imaging results that were available during my care of the patient were reviewed by me and considered in my medical decision making (see chart for details).     Well-appearing 66 year old female presenting with diffuse vague symptoms for the last 5 days. Labs were reassuring. Vital signs reassuring. Patient did have an x-ray showing multifocal pneumonia. Patient has had increased cough and mild fatigue. We'll treat her for outpatient acquired pneumonia. We'll have her follow-up with her primary care physician to get repeat chest x-ray to make sure it resolves. Patient's taking by mouth, ambulatory and appears well at time of discharge.  Final Clinical Impressions(s) / ED Diagnoses   Final diagnoses:    None    New Prescriptions New Prescriptions   No medications on file     Macarthur Critchley, MD 11/17/16 [phone]

## 2016-11-17 NOTE — ED Notes (Signed)
Pt called for room x2 with no answer 

## 2016-11-17 NOTE — ED Notes (Signed)
Pt located and pt name undischarged

## 2016-11-21 ENCOUNTER — Ambulatory Visit
Admission: RE | Admit: 2016-11-21 | Discharge: 2016-11-21 | Disposition: A | Discharge status: Home / Self Care | Payer: PPO | Source: Ambulatory Visit | Attending: Family Medicine | Admitting: Family Medicine

## 2016-11-21 DIAGNOSIS — E2839 Other primary ovarian failure: Secondary | ICD-10-CM

## 2016-11-21 DIAGNOSIS — Z780 Asymptomatic menopausal state: Secondary | ICD-10-CM | POA: Diagnosis not present

## 2016-11-21 DIAGNOSIS — Z1231 Encounter for screening mammogram for malignant neoplasm of breast: Secondary | ICD-10-CM

## 2016-11-21 DIAGNOSIS — M8589 Other specified disorders of bone density and structure, multiple sites: Secondary | ICD-10-CM | POA: Diagnosis not present

## 2016-11-23 ENCOUNTER — Telehealth: Payer: Self-pay | Admitting: Family Medicine

## 2016-11-23 NOTE — Telephone Encounter (Signed)
Patient called the office asking to speak with nurse regarding her results from the breast center and her bone density. Please follow up.  Thank you.

## 2016-11-23 NOTE — Telephone Encounter (Signed)
Attempt to call patient, left message on voicemail to return call.

## 2016-11-24 NOTE — Telephone Encounter (Signed)
Pt called back to check on the lab result, she inform me that she call this morning and was told that we don't have the result, so I inform her that the nurse will call her back as soon we have the result, she got mad , please follow up

## 2016-11-30 NOTE — Telephone Encounter (Signed)
Pt name and DOB verified. Pt aware of results. She was told she would be placed on waiting list to f/u for [facility].  Scheduled appointment with [facility] provider on December 06, 2016 @ 3pm.

## 2016-12-02 ENCOUNTER — Other Ambulatory Visit: Payer: Self-pay | Admitting: Physician Assistant

## 2016-12-02 ENCOUNTER — Other Ambulatory Visit: Payer: Self-pay | Admitting: Family Medicine

## 2016-12-02 DIAGNOSIS — M25561 Pain in right knee: Secondary | ICD-10-CM

## 2016-12-02 DIAGNOSIS — E876 Hypokalemia: Secondary | ICD-10-CM

## 2016-12-02 DIAGNOSIS — M25562 Pain in left knee: Principal | ICD-10-CM

## 2016-12-03 ENCOUNTER — Emergency Department (HOSPITAL_COMMUNITY): Payer: PPO

## 2016-12-03 ENCOUNTER — Encounter (HOSPITAL_COMMUNITY): Payer: Self-pay | Admitting: Emergency Medicine

## 2016-12-03 ENCOUNTER — Emergency Department (HOSPITAL_COMMUNITY)
Admission: EM | Admit: 2016-12-03 | Discharge: 2016-12-03 | Disposition: A | Discharge status: Home / Self Care | Payer: PPO | Attending: Emergency Medicine | Admitting: Emergency Medicine

## 2016-12-03 DIAGNOSIS — M25562 Pain in left knee: Secondary | ICD-10-CM | POA: Insufficient documentation

## 2016-12-03 DIAGNOSIS — M25511 Pain in right shoulder: Secondary | ICD-10-CM | POA: Insufficient documentation

## 2016-12-03 DIAGNOSIS — M25561 Pain in right knee: Secondary | ICD-10-CM | POA: Diagnosis not present

## 2016-12-03 DIAGNOSIS — Z5321 Procedure and treatment not carried out due to patient leaving prior to being seen by health care provider: Secondary | ICD-10-CM | POA: Insufficient documentation

## 2016-12-03 DIAGNOSIS — R600 Localized edema: Secondary | ICD-10-CM | POA: Insufficient documentation

## 2016-12-03 NOTE — ED Triage Notes (Addendum)
Reports having pneumonia three weeks ago. Having right shoulder pain since.  No respiratory issues at this time.  Also endorses bil knee swelling and pain.  Hx of knee replacement bil.  Requesting xray of right knee.  States it gives out on me.

## 2016-12-03 NOTE — ED Notes (Signed)
Called pt names multiple times, no answer.

## 2016-12-04 ENCOUNTER — Emergency Department (HOSPITAL_COMMUNITY): Payer: PPO

## 2016-12-04 ENCOUNTER — Encounter (HOSPITAL_COMMUNITY): Payer: Self-pay | Admitting: *Deleted

## 2016-12-04 ENCOUNTER — Emergency Department (HOSPITAL_COMMUNITY)
Admission: EM | Admit: 2016-12-04 | Discharge: 2016-12-04 | Disposition: A | Discharge status: Home / Self Care | Payer: PPO | Attending: Emergency Medicine | Admitting: Emergency Medicine

## 2016-12-04 DIAGNOSIS — M19011 Primary osteoarthritis, right shoulder: Secondary | ICD-10-CM | POA: Diagnosis not present

## 2016-12-04 DIAGNOSIS — M25562 Pain in left knee: Secondary | ICD-10-CM

## 2016-12-04 DIAGNOSIS — Z91040 Latex allergy status: Secondary | ICD-10-CM | POA: Diagnosis not present

## 2016-12-04 DIAGNOSIS — Z79899 Other long term (current) drug therapy: Secondary | ICD-10-CM | POA: Diagnosis not present

## 2016-12-04 DIAGNOSIS — I10 Essential (primary) hypertension: Secondary | ICD-10-CM | POA: Insufficient documentation

## 2016-12-04 DIAGNOSIS — Z7982 Long term (current) use of aspirin: Secondary | ICD-10-CM | POA: Diagnosis not present

## 2016-12-04 DIAGNOSIS — M1711 Unilateral primary osteoarthritis, right knee: Secondary | ICD-10-CM | POA: Diagnosis not present

## 2016-12-04 DIAGNOSIS — Z87891 Personal history of nicotine dependence: Secondary | ICD-10-CM | POA: Insufficient documentation

## 2016-12-04 DIAGNOSIS — M25561 Pain in right knee: Secondary | ICD-10-CM

## 2016-12-04 DIAGNOSIS — M25511 Pain in right shoulder: Secondary | ICD-10-CM | POA: Diagnosis present

## 2016-12-04 DIAGNOSIS — R05 Cough: Secondary | ICD-10-CM | POA: Diagnosis not present

## 2016-12-04 MED ORDER — MELOXICAM 7.5 MG PO TABS: 7.5000 mg | ORAL_TABLET | Freq: Every day | ORAL | 2 refills | Status: DC

## 2016-12-04 NOTE — ED Provider Notes (Signed)
[facility] DEPT Provider Note   CSN: [id number] Arrival date & time: 12/04/16  0243     History   Chief Complaint Chief Complaint  Patient presents with  . Shoulder Pain    HPI [name] is a 66 y.o. female.  Patient presents to the emergency department for evaluation of right shoulder and right knee pain. Patient reports that she has been having pain in her right shoulder for approximately 3 weeks. She does not know of any trauma to the area. Pain is sharp, at the top part of the shoulder and worsens with movement of the arm. She has not noticed any numbness, tingling or weakness of the arm.  Patient also complaining of swelling of her right knee. She has not had any injury. She reports that it feels like the knee is going to "give out" when she walks. There is constant pain in the knee that worsens with movement.      Past Medical History:  Diagnosis Date  . Abnormal EKG 2012   hospitalized for T wave inversion in lateral leads with [facility] chest pains, normal ECHO, negative trops, no cardiology consult. recent EKG 7/15 stable T wave inversions.   . 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 Arthritis 2000  . Heart murmur   . Hyperlipidemia   . Hypertension 2010  . Meniscus tear    L KNEE    Patient Active Problem List   Diagnosis Date Noted  . 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 HLD (hyperlipidemia)   . Aortic atherosclerosis ([place])   . Right-sided low back pain without sciatica 07/08/2015  . 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 Bilateral leg pain 06/14/2015  . Seasonal allergies 03/09/2015  . Hypopigmentation 03/09/2015  . Gingivitis 11/10/2014  . Osteoarthritis of left knee 07/08/2014  . DJD (degenerative joint disease), lumbar 07/08/2014  . Osteoarthritis of right knee 07/08/2014  . Foot swelling 06/11/2014  . Pain due to onychomycosis of toenail 06/11/2014  . Borborygmi 06/11/2014  . Allergic  rhinoconjunctivitis of both eyes 06/11/2014  . Nearsightedness 04/27/2014  . Abnormal EKG   . Hypertension 12/11/2013    Past Surgical History:  Procedure Laterality Date  . ABDOMINAL SURGERY  2010   for bowel blockage  . KNEE ARTHROSCOPY Left 09/25/2013   Procedure: LEFT KNEE ARTHROSCOPY WITH PARTIAL MEDIAL AND LATERAL  MENISCECTOMY/DEBRIDEMENT/MICRO FRACTURE MEDIAL FEMORAL CONDYLE, REMOVAL OF OSTEOCONDRAL FRAGMENT;  Surgeon: Johnn Hai, MD;  Location: [facility] ORS;  Service: Orthopedics;  Laterality: Left;  . KNEE SURGERY Right 1999  . TEE WITHOUT CARDIOVERSION N/A 10/04/2015   Procedure: TRANSESOPHAGEAL ECHOCARDIOGRAM (TEE);  Surgeon: Sueanne Margarita, MD;  Location: [facility] ENDOSCOPY;  Service: Cardiovascular;  Laterality: N/A;  . TUBAL LIGATION  1979    OB History    No data available       Home Medications    Prior to Admission medications   Medication Sig Start Date End Date Taking? Authorizing Provider  acetaminophen-codeine (TYLENOL #3) 300-30 MG tablet TAKE 1 TABLET BY MOUTH EVERY 8 HOURS AS NEEDED FOR MODERATE PAIN 11/09/16   Tresa Garter, MD  amLODipine (NORVASC) 10 MG tablet Take 1 tablet (10 mg total) by mouth daily. 10/12/16   Boykin Nearing, MD  aspirin EC 81 MG tablet Take 1 tablet (81 mg total) by mouth daily. 10/12/16   Funches, Adriana Mccallum, MD  atorvastatin (LIPITOR) 40 MG tablet TAKE 1 TABLET(40 MG) BY MOUTH DAILY AT 6 PM 10/12/16  Funches, Josalyn, MD  carvedilol (COREG) 12.5 MG tablet TAKE 1 TABLET(12.5 MG) BY MOUTH TWICE DAILY WITH A MEAL 10/12/16   Funches, Josalyn, MD  fluticasone (FLONASE) 50 MCG/ACT nasal spray Place 2 sprays into both nostrils daily. 07/11/16   Funches, Adriana Mccallum, MD  furosemide (LASIX) 20 MG tablet Take 1 tablet (20 mg total) by mouth daily. 10/24/16   Argentina Donovan, PA-C  loratadine (CLARITIN) 10 MG tablet Take 1 tablet (10 mg total) by mouth daily. 07/11/16   Funches, Adriana Mccallum, MD  meloxicam (MOBIC) 7.5 MG tablet Take 1 tablet (7.5 mg total)  by mouth daily. 12/04/16   Orpah Greek, MD  Multiple Vitamin (MULITIVITAMIN WITH MINERALS) TABS Take 1 tablet by mouth daily.    [provider]  potassium chloride SA (K-DUR,KLOR-CON) 20 MEQ tablet TAKE 1 TABLET(20 MEQ) BY MOUTH DAILY 08/10/16   Funches, Josalyn, MD  tiZANidine (ZANAFLEX) 4 MG tablet TAKE 1 TABLET(4 MG) BY MOUTH EVERY 8 HOURS AS NEEDED FOR MUSCLE SPASMS 09/26/16   Boykin Nearing, MD    Family History Family History  Problem Relation Age of Onset  . Hypertension Mother   . Heart disease Mother        has a pacemaker   . Heart failure Father   . Hypertension Father   . Cancer Neg Hx   . Colon cancer Neg Hx     Social History Social History  Substance Use Topics  . Smoking status: Former Smoker    Quit date: 07/29/2010  . Smokeless tobacco: Never Used  . Alcohol use No     Allergies   Shrimp [shellfish allergy]; Latex; and Penicillins   Review of Systems Review of Systems  Musculoskeletal: Positive for arthralgias.  All other systems reviewed and are negative.    Physical Exam Updated Vital Signs BP (!) 171/81 (BP Location: Right Arm)   Pulse 76   Temp 97.7 F (36.5 C) (Oral)   Resp 16   Ht 5\' 5"  (1.651 m)   Wt 70.3 kg (155 lb)   SpO2 98%   BMI 25.79 kg/m   Physical Exam  Constitutional: She is oriented to person, place, and time. She appears well-developed and well-nourished. No distress.  HENT:  Head: Normocephalic and atraumatic.  Right Ear: Hearing normal.  Left Ear: Hearing normal.  Nose: Nose normal.  Mouth/Throat: Oropharynx is clear and moist and mucous membranes are normal.  Eyes: Pupils are equal, round, and reactive to light. Conjunctivae and EOM are normal.  Neck: Normal range of motion. Neck supple.  Cardiovascular: Regular rhythm, S1 normal and S2 normal.  Exam reveals no gallop and no friction rub.   No murmur heard. Pulses:      Radial pulses are 2+ on the right side.       Dorsalis pedis pulses are 2+ on  the right side.  Pulmonary/Chest: Effort normal and breath sounds normal. No respiratory distress. She exhibits no tenderness.  Abdominal: Soft. Normal appearance and bowel sounds are normal. There is no hepatosplenomegaly. There is no tenderness. There is no rebound, no guarding, no tenderness at McBurney's point and negative Murphy's sign. No hernia.  Musculoskeletal: Normal range of motion.       Right shoulder: She exhibits tenderness. She exhibits normal range of motion and no deformity.       Right knee: She exhibits swelling and deformity. She exhibits normal range of motion, no effusion, no ecchymosis and no erythema. Tenderness found.       Arms: Neurological:  She is alert and oriented to person, place, and time. She has normal strength. No cranial nerve deficit or sensory deficit. Coordination normal. GCS eye subscore is 4. GCS verbal subscore is 5. GCS motor subscore is 6.  Skin: Skin is warm, dry and intact. No rash noted. No cyanosis.  Psychiatric: She has a normal mood and affect. Her speech is normal and behavior is normal. Thought content normal.  Nursing note and vitals reviewed.    ED Treatments / Results  Labs (all labs ordered are listed, but only abnormal results are displayed) Labs Reviewed - No data to display  EKG  EKG Interpretation None       Radiology Dg Chest 2 View  Result Date: 12/04/2016 CLINICAL DATA:  Cough.  Recent pneumonia. EXAM: CHEST  2 VIEW COMPARISON:  None. FINDINGS: The heart size and mediastinal contours are within normal limits. Calcified aortic knob. Moderate bronchitic changes. No pleural effusion or focal consolidation clear. The visualized skeletal structures are nonsuspicious. Upper lumbar dextroscoliosis. IMPRESSION: Bronchitic changes without focal consolidation. Aortic Atherosclerosis (ICD10-I70.0). Electronically Signed   By: Elon Alas M.D.   On: 12/04/2016 04:43   Dg Shoulder Right  Result Date: 12/03/2016 CLINICAL DATA:   Right shoulder pain about the acromioclavicular joint for 3 weeks. No known injury. EXAM: RIGHT SHOULDER - 2+ VIEW COMPARISON:  None. FINDINGS: There is no acute bony or joint abnormality. Moderate to moderately severe acromioclavicular osteoarthritis is seen. Calcifications projecting over the greater tuberosity are consistent with calcific rotator cuff tendinopathy. IMPRESSION: No acute finding. Moderate to moderately severe acromioclavicular osteoarthritis. Calcific rotator cuff tendinopathy. Electronically Signed   By: Inge Rise M.D.   On: 12/03/2016 22:47   Dg Knee Complete 4 Views Right  Result Date: 12/03/2016 CLINICAL DATA:  Right knee pain and giving way for 3 weeks. No known injury. EXAM: RIGHT KNEE - COMPLETE 4+ VIEW COMPARISON:  Plain films right knee 05/11/2015. FINDINGS: There is no acute bony or joint abnormality. Degenerative disease about the knee is identified. Joint space narrowing appears worst laterally. Bulky osteophytes are present about the mediolateral compartments. Small joint effusion. IMPRESSION: No acute abnormality. Tricompartmental osteoarthritis does not appear markedly changed. Electronically Signed   By: Inge Rise M.D.   On: 12/03/2016 22:48    Procedures Procedures (including critical care time)  Medications Ordered in ED Medications - No data to display   Initial Impression / Assessment and Plan / ED Course  I have reviewed the triage vital signs and the nursing notes.  Pertinent labs & imaging results that were available during my care of the patient were reviewed by me and considered in my medical decision making (see chart for details).     Patient presents to the emergency department for evaluation of right shoulder and right knee pain. Neither area has suffered any trauma. Examination of the right shoulder reveals diffuse tenderness without swelling or deformity. She has painful range of motion but range of motion is retained. X-ray shows  calcific tendinitis and arthritis.  Patient Also complaining of right knee pain. No trauma to the knee. There is some superficial swelling noted, but no erythema or warmth. No joint effusion noted. X-ray shows significant arthritis. Examination does not raise concern for septic joint.  Patient's pain is explained by the significant degenerative changes on the x-rays. Will treat with analgesia.  Noted to have a significant bilateral multifocal pneumonia when she was seen in the ER 2 weeks ago. She reports that her symptoms are improved.  Lungs are clear today. CXR Today shows resolution of pneumonia.  Final Clinical Impressions(s) / ED Diagnoses   Final diagnoses:  Primary osteoarthritis of right shoulder  Primary osteoarthritis of right knee    New Prescriptions Current Discharge Medication List       Orpah Greek, MD 12/04/16 [phone]

## 2016-12-04 NOTE — ED Triage Notes (Signed)
The pt checked in at 2100 for rt shoulder and rt knee pain she had an xray  And she was called but no answer.   She was  Taken out of the computer but she has been here all the time

## 2016-12-04 NOTE — ED Notes (Signed)
Taken to xray.

## 2016-12-06 ENCOUNTER — Ambulatory Visit: Payer: Self-pay

## 2016-12-13 ENCOUNTER — Other Ambulatory Visit: Payer: Self-pay | Admitting: Internal Medicine

## 2016-12-13 DIAGNOSIS — F1190 Opioid use, unspecified, uncomplicated: Secondary | ICD-10-CM

## 2016-12-13 DIAGNOSIS — M47816 Spondylosis without myelopathy or radiculopathy, lumbar region: Secondary | ICD-10-CM

## 2016-12-13 DIAGNOSIS — R0789 Other chest pain: Secondary | ICD-10-CM

## 2016-12-17 NOTE — Telephone Encounter (Signed)
Pt requesting RF on Tylenol #3 which she is on for OA knees. Was receiving from previous PCP Dr. Adrian Blackwater who haas left the practice. [place] reviewed. Over past 6 mths, she received 2 rxns for Hydrocodone which she received through the ER. Otherwise rxns have been from this practice. Pt has appt with me in 01/2017. Will RF. Will have CMA do controlled substance prescribing agreement and UDS with her when she comes to pick up rxn.

## 2016-12-20 ENCOUNTER — Other Ambulatory Visit: Payer: Self-pay

## 2016-12-20 DIAGNOSIS — R0789 Other chest pain: Secondary | ICD-10-CM

## 2016-12-20 DIAGNOSIS — M47816 Spondylosis without myelopathy or radiculopathy, lumbar region: Secondary | ICD-10-CM

## 2016-12-20 MED ORDER — ACETAMINOPHEN-CODEINE #3 300-30 MG PO TABS: ORAL_TABLET | ORAL | 1 refills | Status: DC

## 2016-12-25 NOTE — Telephone Encounter (Signed)
Pt contacted the office to see if her medication was ready. Desiy informed pt that rx was ready for pick-up but pt will need to sign controlled substance form and do a urine drug screen. I then spoke to the patient to better explain it. Pt states she signed a controlled substance agreement form with Dr. Adrian Blackwater and she did a urine at the hospital. I informed pt that since Dr. Wynetta Emery will be prescribing her medication and Dr. Wynetta Emery wants her to sign the agreement and as far as the urine I did not see any urine results in the chart. Pt then states she will not be signing the form and she will not be leaving a urine. Pt then stated she will call back and hang up. As told by [place] if patient refuses to sign agreement and leave a urine she will not be able to get rx.

## 2017-01-11 ENCOUNTER — Emergency Department (HOSPITAL_COMMUNITY)
Admission: EM | Admit: 2017-01-11 | Discharge: 2017-01-12 | Disposition: A | Discharge status: Home / Self Care | Payer: PPO | Attending: Emergency Medicine | Admitting: Emergency Medicine

## 2017-01-11 ENCOUNTER — Other Ambulatory Visit: Payer: Self-pay | Admitting: Internal Medicine

## 2017-01-11 ENCOUNTER — Encounter (HOSPITAL_COMMUNITY): Payer: Self-pay | Admitting: Nurse Practitioner

## 2017-01-11 ENCOUNTER — Emergency Department (HOSPITAL_COMMUNITY): Payer: PPO

## 2017-01-11 DIAGNOSIS — R011 Cardiac murmur, unspecified: Secondary | ICD-10-CM | POA: Diagnosis not present

## 2017-01-11 DIAGNOSIS — M79671 Pain in right foot: Secondary | ICD-10-CM | POA: Diagnosis not present

## 2017-01-11 DIAGNOSIS — Z79899 Other long term (current) drug therapy: Secondary | ICD-10-CM | POA: Diagnosis not present

## 2017-01-11 DIAGNOSIS — E876 Hypokalemia: Secondary | ICD-10-CM

## 2017-01-11 DIAGNOSIS — M79672 Pain in left foot: Secondary | ICD-10-CM | POA: Diagnosis not present

## 2017-01-11 DIAGNOSIS — Z91040 Latex allergy status: Secondary | ICD-10-CM | POA: Insufficient documentation

## 2017-01-11 DIAGNOSIS — M79644 Pain in right finger(s): Secondary | ICD-10-CM | POA: Insufficient documentation

## 2017-01-11 DIAGNOSIS — I10 Essential (primary) hypertension: Secondary | ICD-10-CM | POA: Diagnosis not present

## 2017-01-11 DIAGNOSIS — E785 Hyperlipidemia, unspecified: Secondary | ICD-10-CM | POA: Diagnosis not present

## 2017-01-11 DIAGNOSIS — Z7982 Long term (current) use of aspirin: Secondary | ICD-10-CM | POA: Diagnosis not present

## 2017-01-11 DIAGNOSIS — M19041 Primary osteoarthritis, right hand: Secondary | ICD-10-CM | POA: Diagnosis not present

## 2017-01-11 DIAGNOSIS — Z87891 Personal history of nicotine dependence: Secondary | ICD-10-CM | POA: Insufficient documentation

## 2017-01-11 MED ORDER — MELOXICAM 15 MG PO TABS: 15.0000 mg | ORAL_TABLET | Freq: Every day | ORAL | 0 refills | Status: DC

## 2017-01-11 MED ORDER — ACETAMINOPHEN 500 MG PO TABS
1000.0000 mg | ORAL_TABLET | Freq: Once | ORAL | Status: AC
  Administered 2017-01-11: 1000 mg via ORAL
  Filled 2017-01-11: qty 2

## 2017-01-11 NOTE — ED Provider Notes (Signed)
[facility] EMERGENCY DEPARTMENT Provider Note   CSN: [id number] Arrival date & time: 01/11/17  2052     History   Chief Complaint Chief Complaint  Patient presents with  . Foot Injury    HPI [name] is a 66 y.o. female  Presents with gradually worsening right thumb pain x 1 week associated with focal swelling x 1 day. Non radiating. Worse with touch and opening jars/bottles. Has not tried anything OTC for pain. Reports previous injury to thumb "ligaments" with deformity many years ago that she did PT, but no fractures. Denies recent falls or trauma. 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   Also reports intermittent, burning pain to bilateral soles x 1 week.  Worse with walking. Not worse with palpation or ankle/feet ROM. Heating and bengay and epson salt not providing relief. Denies injury, numbness, tingling. No h/o DM or neuropathy. Called her PCP for this and has appointment next month.   HPI  Past Medical History:  Diagnosis Date  . Abnormal EKG 2012   hospitalized for T wave inversion in lateral leads with MSK chest pains, normal ECHO, negative trops, no cardiology consult. recent EKG 7/15 stable T wave inversions.   . 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 Arthritis 2000  . Heart murmur   . Hyperlipidemia   . Hypertension 2010  . Meniscus tear    L KNEE    Patient Active Problem List   Diagnosis Date Noted  . 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 HLD (hyperlipidemia)   . Aortic atherosclerosis ([place])   . Right-sided low back pain without sciatica 07/08/2015  . 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 Bilateral leg pain 06/14/2015  . Seasonal allergies 03/09/2015  . Hypopigmentation 03/09/2015  . Gingivitis 11/10/2014  . Osteoarthritis of left knee 07/08/2014  . DJD (degenerative joint disease), lumbar 07/08/2014  . Osteoarthritis of right knee 07/08/2014  . Foot swelling 06/11/2014    . Pain due to onychomycosis of toenail 06/11/2014  . Borborygmi 06/11/2014  . Allergic rhinoconjunctivitis of both eyes 06/11/2014  . Nearsightedness 04/27/2014  . Abnormal EKG   . Hypertension 12/11/2013    Past Surgical History:  Procedure Laterality Date  . ABDOMINAL SURGERY  2010   for bowel blockage  . KNEE ARTHROSCOPY Left 09/25/2013   Procedure: LEFT KNEE ARTHROSCOPY WITH PARTIAL MEDIAL AND LATERAL  MENISCECTOMY/DEBRIDEMENT/MICRO FRACTURE MEDIAL FEMORAL CONDYLE, REMOVAL OF OSTEOCONDRAL FRAGMENT;  Surgeon: Johnn Hai, MD;  Location: [facility] ORS;  Service: Orthopedics;  Laterality: Left;  . KNEE SURGERY Right 1999  . TEE WITHOUT CARDIOVERSION N/A 10/04/2015   Procedure: TRANSESOPHAGEAL ECHOCARDIOGRAM (TEE);  Surgeon: Sueanne Margarita, MD;  Location: [facility] ENDOSCOPY;  Service: Cardiovascular;  Laterality: N/A;  . TUBAL LIGATION  1979    OB History    No data available       Home Medications    Prior to Admission medications   Medication Sig Start Date End Date Taking? Authorizing Provider  acetaminophen-codeine (TYLENOL #3) 300-30 MG tablet TAKE 1 TABLET BY MOUTH EVERY 8 HOURS AS NEEDED FOR MODERATE PAIN 12/20/16   Ladell Pier, MD  amLODipine (NORVASC) 10 MG tablet Take 1 tablet (10 mg total) by mouth daily. 10/12/16   Boykin Nearing, MD  aspirin EC 81 MG tablet Take 1 tablet (81 mg total) by mouth daily. 10/12/16   Funches, Adriana Mccallum, MD  atorvastatin (LIPITOR) 40 MG tablet TAKE 1 TABLET(40 MG)  BY MOUTH DAILY AT 6 PM 10/12/16   Funches, Adriana Mccallum, MD  carvedilol (COREG) 12.5 MG tablet TAKE 1 TABLET(12.5 MG) BY MOUTH TWICE DAILY WITH A MEAL 10/12/16   Funches, Josalyn, MD  fluticasone (FLONASE) 50 MCG/ACT nasal spray Place 2 sprays into both nostrils daily. 07/11/16   Funches, Adriana Mccallum, MD  furosemide (LASIX) 20 MG tablet Take 1 tablet (20 mg total) by mouth daily. 10/24/16   Argentina Donovan, PA-C  loratadine (CLARITIN) 10 MG tablet Take 1 tablet (10 mg total) by mouth daily.  07/11/16   Funches, Adriana Mccallum, MD  Multiple Vitamin (MULITIVITAMIN WITH MINERALS) TABS Take 1 tablet by mouth daily.    [provider]  potassium chloride SA (K-DUR,KLOR-CON) 20 MEQ tablet TAKE 1 TABLET BY MOUTH DAILY 01/11/17   Clent Demark, PA-C  tiZANidine (ZANAFLEX) 4 MG tablet TAKE 1 TABLET(4 MG) BY MOUTH EVERY 8 HOURS AS NEEDED FOR MUSCLE SPASMS 09/26/16   Boykin Nearing, MD    Family History Family History  Problem Relation Age of Onset  . Hypertension Mother   . Heart disease Mother        has a pacemaker   . Heart failure Father   . Hypertension Father   . Cancer Neg Hx   . Colon cancer Neg Hx     Social History Social History  Substance Use Topics  . Smoking status: Former Smoker    Quit date: 07/29/2010  . Smokeless tobacco: Never Used  . Alcohol use No     Allergies   Shrimp [shellfish allergy]; Latex; and Penicillins   Review of Systems Review of Systems  Constitutional: Negative for chills and fever.  Musculoskeletal: Positive for arthralgias (right thumb), gait problem and myalgias (bottom feet, "burning").  Skin: Negative for color change and wound.  Neurological: Negative for weakness and numbness.     Physical Exam Updated Vital Signs BP 137/83 (BP Location: Right Arm)   Pulse 78   Temp 98 F (36.7 C) (Oral)   Resp 16   Ht 5\' 5"  (1.651 m)   Wt 70.3 kg (155 lb)   SpO2 95%   BMI 25.79 kg/m   Physical Exam  Constitutional: She is oriented to person, place, and time. She appears well-developed and well-nourished. No distress.  NAD.  HENT:  Head: Normocephalic and atraumatic.  Right Ear: External ear normal.  Left Ear: External ear normal.  Nose: Nose normal.  Eyes: Conjunctivae and EOM are normal. No scleral icterus.  Neck: Normal range of motion. Neck supple.  Cardiovascular: Normal rate, regular rhythm and normal heart sounds.   No murmur heard. Pulmonary/Chest: Effort normal and breath sounds normal. She has no wheezes.   Musculoskeletal: Normal range of motion. She exhibits no deformity.  FAROM of right thumb without pain with good thumb opposition. Focal tenderness to right CMC mostly ventrally without edema, fluctuance, skin changes, ecchymosis or crepitus.  Neurological: She is alert and oriented to person, place, and time.  5/5 strength with hand grip bilaterally Sensation in radial, medial and ulnar nerve distribution intact bilaterally  Feet: sensation to light touch and sharp/dull intact in saphenous, tibial and sural nerves, bilaterally.  5/5 strength with ankle dorsiflexion and plantar flexion, bilaterally.   Skin: Skin is warm and dry. Capillary refill takes less than 2 seconds.  Normal skin in bilateral soles without skin abrasions, erythema, edema, warmth, wounds  No chronic skin changes to LE bilaterally  Psychiatric: She has a normal mood and affect. Her behavior is normal. Judgment  and thought content normal.  Nursing note and vitals reviewed.    ED Treatments / Results  Labs (all labs ordered are listed, but only abnormal results are displayed) Labs Reviewed - No data to display  EKG  EKG Interpretation None       Radiology Dg Finger Thumb Right  Result Date: 01/11/2017 CLINICAL DATA:  Right thumb pain for a week. History of injury a few years ago. Arthritis. Cannot straighten the thumb due to arthritis. EXAM: RIGHT THUMB 2+V COMPARISON:  None. FINDINGS: Degenerative changes in the first carpometacarpal, metacarpal phalangeal, and interphalangeal joints. Mild subluxation at the metacarpal phalangeal joint which the patient indicates is chronic. No evidence of acute fracture. No focal bone lesion or bone destruction. Bone cortex appears intact. Soft tissues are unremarkable. IMPRESSION: Degenerative changes in the right first finger. No acute bony abnormalities. Electronically Signed   By: Lucienne Capers M.D.   On: 01/11/2017 22:58    Procedures Procedures (including critical  care time)  Medications Ordered in ED Medications  acetaminophen (TYLENOL) tablet 1,000 mg (1,000 mg Oral Given 01/11/17 2252)     Initial Impression / Assessment and Plan / ED Course  I have reviewed the triage vital signs and the nursing notes.  Pertinent labs & imaging results that were available during my care of the patient were reviewed by me and considered in my medical decision making (see chart for details).  Clinical Course as of Jan 13 43  Thu Jan 11, 2017  2326 FINDINGS: Degenerative changes in the first carpometacarpal, metacarpal phalangeal, and interphalangeal joints. Mild subluxation at the metacarpal phalangeal joint which the patient indicates is chronic. No evidence of acute fracture. No focal bone lesion or bone destruction. Bone cortex appears intact. Soft tissues are unremarkable. DG Finger Thumb Right [CG]    Clinical Course User Index [CG] Kinnie Feil, PA-C   66 yo female with right thumb pain x 1 week, no trauma. H/o of previous injury to this joint with chronic deformity. Exam reassuring. Extremity is NVI. X-ray w/o acute bony pathology. Will place in wrist brace and d/c with RICE and NSAIDs. Intermittent, burning pain to bilateral soles sounds like neuropathy. No h/o DM.  Bilateral feet NVI without evidence of skin breakdown or chronic changes. Not tender on palpation today. Will tx conservatively and d/c with PCP follow up for this. May need nerve studies. Will hold off narcotics and gabapentin today.   Final Clinical Impressions(s) / ED Diagnoses   Final diagnoses:  Pain of right thumb    New Prescriptions Discharge Medication List as of 01/12/2017 12:19 AM       Kinnie Feil, PA-C 01/12/17 0044    Forde Dandy, MD 01/13/17 [phone]

## 2017-01-11 NOTE — ED Triage Notes (Signed)
Pt endorses bilateral foot pain ongoing x 2 weeks. Pt endorses burning pain. Pt denies injury, no issues with ambulation. Pt endorses right thumb pain as well. Denies injury has full ROM

## 2017-01-11 NOTE — Discharge Instructions (Signed)
You presented to ED for right thumb pain and burning pain to both of your feet.  X-ray of your thumb showed some arthritis. This can explain your pain. Please take meloxicam as prescribed. Additionally you can take up to 1000 mg of Tylenol every 8 hours. Wear wrist brace for 1 week and until you're be evaluated by your primary care provider.  It is unclear what could be causing your burning pain to the bottom of your feet. This could be from nerve pain. Please follow-up with your primary care provider for this, she may recommend nerve or muscle testing.

## 2017-01-11 NOTE — Progress Notes (Signed)
Orthopedic Tech Progress Note Patient Details:  [name] 21-Sep-1950 [id number]  Ortho Devices Type of Ortho Device: Thumb velcro splint Ortho Device/Splint Location: RUE Ortho Device/Splint Interventions: Ordered, Application   Braulio Bosch 01/11/2017, 10:54 PM

## 2017-01-12 ENCOUNTER — Other Ambulatory Visit: Payer: Self-pay

## 2017-01-12 NOTE — ED Notes (Signed)
Pt verbalized understanding discharge instructions and denies any further needs or questions at this time. VS stable, ambulatory and steady gait.   

## 2017-01-15 ENCOUNTER — Other Ambulatory Visit: Payer: Self-pay

## 2017-01-15 NOTE — Patient Outreach (Signed)
[facility]) Care Management  01/15/2017  [name] 1950-04-15 [id number]   Telephone Screen  Referral Date: 01/12/17 Referral Source: [facility] ED Census Report Referral Reason: " ED 01/11/17 for foot injury and thumb pain, patient would like info about resources for vision and dental due to current plan not covering" Insurance: [employer]  Outreach attempt #  1 to patient. Spoke with patient and screening completed.    Social: Patient resides in her home along with her mother and her son. She voices that she is independent with ADLS/IADLs. 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 Patient drives herself to medical appts. She denies any recent falls/ DME in the home include cane and BP monitor.   Conditions: Patient has PMH of heart murmur, HLD, arthritis, HTN, DJD and aortic valve regurgitation. She voice that she is monitoring BP in the home. Patient states BP controlled. She voices she is adhering to low salt and heart healthy diet. She was in the ED on 01/11/17 for foot and thumb pain. Otherwise, patient states that she is doing well regarding her health and managing her chronic conditions. Patient is requesting assistance with dental and vision resources. She voices that she has ben unable to see dentist and opthalmologist due to financial issues. She voices that she is in need of several teeth extractions and dentures. She also states she needs eyeglasses but unable to afford them as well as vision exam.    Medications: She voices that she is taking six meds. Denies any issues affording/managing meds.    Appointments: Patient states she saw PCP last in August. She voices that she was seeing Dr. Adrian Blackwater but was switched to another MD in the same office(Dr. Wynetta Emery). Patient unable to verbalize why she was switched.    Advance Directives: None. Patient interested in completing and requests further info.   Consent: [facility] services reviewed and discussed.  Patient gave verbal consent for services. She has no RN CM care coordination needs at this time. Patient is knowledgeable regarding medical conditions and needs no further education. She is agreeable to SW assistance.    Plan: RN CM will notify [facility] administrative assistant of case status. RN CM will send [facility] SW referral for possible community resources related to dental, vision and assistance with advance directives.   Enzo Montgomery, RN,BSN,CCM [place] Management Telephonic Care Management Coordinator Direct Phone: [phone] Toll Free: [phone] Fax: [phone]

## 2017-01-16 ENCOUNTER — Other Ambulatory Visit: Payer: Self-pay | Admitting: Licensed Clinical Social Worker

## 2017-01-16 NOTE — Patient Outreach (Signed)
THIS IS A CORRECTION TO PREVIOUS NOTE. Request for [employer] was from [place], [place] and not [employer] LCSW.

## 2017-01-16 NOTE — Patient Outreach (Signed)
Request received from Janet Caldwell, LCSW to mail patient personal care resources.  Information mailed today. 

## 2017-01-16 NOTE — Patient Outreach (Signed)
[facility]) Care Management  01/16/2017  [name] [birth year] [id number]  Assessment- CSW received new referral for dental, vision and advance directives assistance. CSW completed initial outreach call and was able to reach patient successfully. Patient provided HIPPA verifications. CSW introduced self, reason for call and of [facility] social work services. Patient reported that she is not interested in advance directives although she reported interest to [place]. Patient shares "I really just need some resources." Patient was educated on the [employer] which is a program that provides vision assistance. Patient is eligible for program as she has food stamps. Patient is aware that eye exam will be $15 and the eyeglasses will cost her $25. Patient understands that wait list is out until February of 2019 at this time. CSW will place patient on the wait list for [employer]. Patient is also in need of dental resources. CSW provided education on each available dental resource within [place]. Patient is agreeable to CSW mailing her a copy of these resources for her to have on hand. Patient denies needing any other assistance or needing a home visit. She is very Patent attorney of call and resource information. CSW encouraged her to contact CSW in the future if she changes her mind. Patient will complete case closure at this time.  CSW completed call to [name] with [employer] and successfully completed referral to program.  CSW will mail patient dental resources for her to have on hand.  Plan-CSW will send mail request to [place] Management Assistant and will notify them of case closure.  [name], BSW, MSW, [name] Phone: [phone] Fax: [phone]

## 2017-01-26 ENCOUNTER — Ambulatory Visit: Payer: Self-pay | Admitting: Internal Medicine

## 2017-01-31 ENCOUNTER — Encounter (INDEPENDENT_AMBULATORY_CARE_PROVIDER_SITE_OTHER): Payer: Self-pay | Admitting: Physician Assistant

## 2017-01-31 ENCOUNTER — Ambulatory Visit (INDEPENDENT_AMBULATORY_CARE_PROVIDER_SITE_OTHER): Payer: PPO | Admitting: Physician Assistant

## 2017-01-31 VITALS — BP 155/77 | HR 74 | Temp 97.8°F | Wt 164.4 lb

## 2017-01-31 DIAGNOSIS — I10 Essential (primary) hypertension: Secondary | ICD-10-CM

## 2017-01-31 DIAGNOSIS — M79672 Pain in left foot: Secondary | ICD-10-CM

## 2017-01-31 DIAGNOSIS — M79671 Pain in right foot: Secondary | ICD-10-CM

## 2017-01-31 DIAGNOSIS — Z131 Encounter for screening for diabetes mellitus: Secondary | ICD-10-CM

## 2017-01-31 DIAGNOSIS — R202 Paresthesia of skin: Secondary | ICD-10-CM

## 2017-01-31 DIAGNOSIS — E876 Hypokalemia: Secondary | ICD-10-CM | POA: Diagnosis not present

## 2017-01-31 DIAGNOSIS — R7303 Prediabetes: Secondary | ICD-10-CM

## 2017-01-31 LAB — POCT GLYCOSYLATED HEMOGLOBIN (HGB A1C): HEMOGLOBIN A1C: 5.9

## 2017-01-31 MED ORDER — ASPIRIN EC 81 MG PO TBEC: 81.0000 mg | DELAYED_RELEASE_TABLET | Freq: Every day | ORAL | 11 refills | Status: AC

## 2017-01-31 MED ORDER — HYDROCHLOROTHIAZIDE 25 MG PO TABS: 25.0000 mg | ORAL_TABLET | Freq: Every day | ORAL | 11 refills | Status: DC

## 2017-01-31 MED ORDER — ACETAMINOPHEN 500 MG PO TABS: 500.0000 mg | ORAL_TABLET | Freq: Three times a day (TID) | ORAL | 0 refills | Status: AC | PRN

## 2017-01-31 MED ORDER — CARVEDILOL 12.5 MG PO TABS: ORAL_TABLET | ORAL | 11 refills | Status: DC

## 2017-01-31 MED ORDER — POTASSIUM CHLORIDE CRYS ER 20 MEQ PO TBCR: 20.0000 meq | EXTENDED_RELEASE_TABLET | Freq: Every day | ORAL | 0 refills | Status: DC

## 2017-01-31 MED ORDER — AMLODIPINE BESYLATE 10 MG PO TABS: 10.0000 mg | ORAL_TABLET | Freq: Every day | ORAL | 11 refills | Status: DC

## 2017-01-31 NOTE — Patient Instructions (Signed)
Prediabetes Eating Plan Prediabetes-also called impaired glucose tolerance or impaired fasting glucose-is a condition that causes blood sugar (blood glucose) levels to be higher than normal. Following a healthy diet can help to keep prediabetes under control. It can also help to lower the risk of type 2 diabetes and heart disease, which are increased in people who have prediabetes. Along with regular exercise, a healthy diet:  Promotes weight loss.  Helps to control blood sugar levels.  Helps to improve the way that the body uses insulin.  What do I need to know about this eating plan?  Use the glycemic index (GI) to plan your meals. The index tells you how quickly a food will raise your blood sugar. Choose low-GI foods. These foods take a longer time to raise blood sugar.  Pay close attention to the amount of carbohydrates in the food that you eat. Carbohydrates increase blood sugar levels.  Keep track of how many calories you take in. Eating the right amount of calories will help you to achieve a healthy weight. Losing about 7 percent of your starting weight can help to prevent type 2 diabetes.  You may want to follow a Mediterranean diet. This diet includes a lot of vegetables, lean meats or fish, whole grains, fruits, and healthy oils and fats. What foods can I eat? Grains Whole grains, such as whole-wheat or whole-grain breads, crackers, cereals, and pasta. Unsweetened oatmeal. Bulgur. Barley. Quinoa. Brown rice. Corn or whole-wheat flour tortillas or taco shells. Vegetables Lettuce. Spinach. Peas. Beets. Cauliflower. Cabbage. Broccoli. Carrots. Tomatoes. Squash. Eggplant. Herbs. Peppers. Onions. Cucumbers. Brussels sprouts. Fruits Berries. Bananas. Apples. Oranges. Grapes. Papaya. Mango. Pomegranate. Kiwi. Grapefruit. Cherries. Meats and Other Protein Sources Seafood. Lean meats, such as chicken and turkey or lean cuts of pork and beef. Tofu. Eggs. Nuts. Beans. Dairy Low-fat or  fat-free dairy products, such as yogurt, cottage cheese, and cheese. Beverages Water. Tea. Coffee. Sugar-free or diet soda. Seltzer water. Milk. Milk alternatives, such as soy or almond milk. Condiments Mustard. Relish. Low-fat, low-sugar ketchup. Low-fat, low-sugar barbecue sauce. Low-fat or fat-free mayonnaise. Sweets and Desserts Sugar-free or low-fat pudding. Sugar-free or low-fat ice cream and other frozen treats. Fats and Oils Avocado. Walnuts. Olive oil. The items listed above may not be a complete list of recommended foods or beverages. Contact your dietitian for more options. What foods are not recommended? Grains Refined white flour and flour products, such as bread, pasta, snack foods, and cereals. Beverages Sweetened drinks, such as sweet iced tea and soda. Sweets and Desserts Baked goods, such as cake, cupcakes, pastries, cookies, and cheesecake. The items listed above may not be a complete list of foods and beverages to avoid. Contact your dietitian for more information. This information is not intended to replace advice given to you by your health care provider. Make sure you discuss any questions you have with your health care provider. Document Released: 07/28/2014 Document Revised: 08/19/2015 Document Reviewed: 04/08/2014 Elsevier Interactive Patient Education  2017 [employer]  

## 2017-01-31 NOTE — Progress Notes (Signed)
Subjective:  Patient ID: [name], female    DOB: 02/20/1951  Age: 66 y.o. MRN: [id number]  CC: HTN and foot pain bilateral  HPI [name] is a 66 y.o. female with a medical history of aortic valve vegetation, arthritis, heart murmur, HLD, HTN, renal infarct, OA of L spine, and Left meniscus tear presents with bilateral foot pain, swelling, erythema, and burning sensation at the soles of feet. Symptoms worse at night. Onset x1 month. Denies itching, long trips, hospitalization     Also presents for management of HTN. BP uncontrolled on amlodipine and carvedilol alone. Complains of LE edema which resolved when she was prescribed one week's worth of Lasix 20 mg qday. LE edema is much improved. Requests refill of lasix. Does not endorse CP, palpitations, SOB, HA, PND, orthopnea, f/c/n/v, or GI/GU sxs.       Outpatient Medications Prior to Visit  Medication Sig Dispense Refill  . amLODipine (NORVASC) 10 MG tablet Take 1 tablet (10 mg total) by mouth daily. 30 tablet 11  . atorvastatin (LIPITOR) 40 MG tablet TAKE 1 TABLET(40 MG) BY MOUTH DAILY AT 6 PM 30 tablet 11  . carvedilol (COREG) 12.5 MG tablet TAKE 1 TABLET(12.5 MG) BY MOUTH TWICE DAILY WITH A MEAL 60 tablet 11  . potassium chloride SA (K-DUR,KLOR-CON) 20 MEQ tablet TAKE 1 TABLET BY MOUTH DAILY 30 tablet 0  . acetaminophen-codeine (TYLENOL #3) 300-30 MG tablet TAKE 1 TABLET BY MOUTH EVERY 8 HOURS AS NEEDED FOR MODERATE PAIN 60 tablet 1  . aspirin EC 81 MG tablet Take 1 tablet (81 mg total) by mouth daily. 30 tablet 11  . fluticasone (FLONASE) 50 MCG/ACT nasal spray Place 2 sprays into both nostrils daily. (Patient not taking: Reported on 01/31/2017) 16 g 5  . furosemide (LASIX) 20 MG tablet Take 1 tablet (20 mg total) by mouth daily. (Patient not taking: Reported on 01/31/2017) 7 tablet 0  . loratadine (CLARITIN) 10 MG tablet Take 1 tablet (10 mg total) by mouth daily. (Patient not taking: Reported on 01/31/2017) 30 tablet 5  .  Multiple Vitamin (MULITIVITAMIN WITH MINERALS) TABS Take 1 tablet by mouth daily.    Marland Kitchen tiZANidine (ZANAFLEX) 4 MG tablet TAKE 1 TABLET(4 MG) BY MOUTH EVERY 8 HOURS AS NEEDED FOR MUSCLE SPASMS (Patient not taking: Reported on 01/31/2017) 30 tablet 0   No facility-administered medications prior to visit.      ROS Review of Systems  Constitutional: Negative for chills, fever and malaise/fatigue.  Eyes: Negative for blurred vision.  Respiratory: Negative for shortness of breath.   Cardiovascular: Negative for chest pain and palpitations.  Gastrointestinal: Negative for abdominal pain and nausea.  Genitourinary: Negative for dysuria and hematuria.  Musculoskeletal: Negative for joint pain and myalgias.  Skin: Negative for rash.  Neurological: Negative for tingling and headaches.  Psychiatric/Behavioral: Negative for depression. The patient is not nervous/anxious.     Objective:  BP (!) 155/77 (BP Location: Left Arm, Patient Position: Sitting, Cuff Size: Normal)   Pulse 74   Temp 97.8 F (36.6 C) (Oral)   Wt 164 lb 6.4 oz (74.6 kg)   SpO2 97%   BMI 27.36 kg/m   BP/Weight 01/31/2017 01/11/2017 7/74/1287  Systolic BP 867 672 094  Diastolic BP 77 83 81  Wt. (Lbs) 164.4 155 155  BMI 27.36 25.79 25.79      Physical Exam  Constitutional: She is oriented to person, place, and time.  Well developed, well nourished, NAD, polite  HENT:  Head: Normocephalic and  atraumatic.  Eyes: No scleral icterus.  Neck: Normal range of motion. Neck supple. No thyromegaly present.  Cardiovascular: Normal rate, regular rhythm and normal heart sounds.  Pulmonary/Chest: Effort normal and breath sounds normal. No respiratory distress.  Abdominal: Bowel sounds are normal.  Musculoskeletal: She exhibits no edema.  Neurological: She is alert and oriented to person, place, and time. No cranial nerve deficit. Coordination normal.  Skin: Skin is warm and dry. No rash noted. No erythema. No pallor.   Psychiatric: She has a normal mood and affect. Her behavior is normal. Thought content normal.  Vitals reviewed.    Assessment & Plan:   1. Bilateral foot pain - Begin Tylenol 500 mg TID x7 days  2. Paresthesia of foot, bilateral - Vitamin B12  3. Hypertension, unspecified type - CBC with Differential - Comprehensive metabolic panel - Lipid panel - TSH  4. Hypokalemia - Refill potassium chloride SA (K-DUR,KLOR-CON) 20 MEQ tablet; Take 1 tablet (20 mEq total) daily by mouth.  Dispense: 30 tablet; Refill: 0  5. Prediabetic - Pt counseled on reducing sweets  Meds ordered this encounter  Medications  . potassium chloride SA (K-DUR,KLOR-CON) 20 MEQ tablet    Sig: Take 1 tablet (20 mEq total) daily by mouth.    Dispense:  30 tablet    Refill:  0    Order Specific Question:   Supervising Provider    Answer:   Tresa Garter [id number]  . carvedilol (COREG) 12.5 MG tablet    Sig: TAKE 1 TABLET(12.5 MG) BY MOUTH TWICE DAILY WITH A MEAL    Dispense:  60 tablet    Refill:  11    Order Specific Question:   Supervising Provider    Answer:   Tresa Garter [id number]  . aspirin EC 81 MG tablet    Sig: Take 1 tablet (81 mg total) daily by mouth.    Dispense:  30 tablet    Refill:  11    Order Specific Question:   Supervising Provider    Answer:   Tresa Garter [id number]  . amLODipine (NORVASC) 10 MG tablet    Sig: Take 1 tablet (10 mg total) daily by mouth.    Dispense:  30 tablet    Refill:  11    Order Specific Question:   Supervising Provider    Answer:   Tresa Garter [id number]  . hydrochlorothiazide (HYDRODIURIL) 25 MG tablet    Sig: Take 1 tablet (25 mg total) daily by mouth. Take on tablet in the morning.    Dispense:  30 tablet    Refill:  11    Order Specific Question:   Supervising Provider    Answer:   Tresa Garter [[id number]]    Follow-up:  4 weeks for full physical  Clent Demark PA

## 2017-02-01 ENCOUNTER — Telehealth (INDEPENDENT_AMBULATORY_CARE_PROVIDER_SITE_OTHER): Payer: Self-pay

## 2017-02-01 LAB — COMPREHENSIVE METABOLIC PANEL
ALK PHOS: 148 IU/L — AB (ref 39–117)
ALT: 17 IU/L (ref 0–32)
AST: 21 IU/L (ref 0–40)
Albumin/Globulin Ratio: 1.5 (ref 1.2–2.2)
Albumin: 4.5 g/dL (ref 3.6–4.8)
BUN/Creatinine Ratio: 14 (ref 12–28)
BUN: 12 mg/dL (ref 8–27)
Bilirubin Total: 0.5 mg/dL (ref 0.0–1.2)
CHLORIDE: 104 mmol/L (ref 96–106)
CO2: 26 mmol/L (ref 20–29)
CREATININE: 0.85 mg/dL (ref 0.57–1.00)
Calcium: 9.7 mg/dL (ref 8.7–10.3)
GFR calc Af Amer: 82 mL/min/{1.73_m2} (ref >59)
GFR calc non Af Amer: 71 mL/min/{1.73_m2} (ref >59)
GLUCOSE: 83 mg/dL (ref 65–99)
Globulin, Total: 3 g/dL (ref 1.5–4.5)
Potassium: 4.2 mmol/L (ref 3.5–5.2)
Sodium: 144 mmol/L (ref 134–144)
Total Protein: 7.5 g/dL (ref 6.0–8.5)

## 2017-02-01 LAB — CBC WITH DIFFERENTIAL/PLATELET
BASOS ABS: 0.1 10*3/uL (ref 0.0–0.2)
Basos: 0 %
EOS (ABSOLUTE): 0.3 10*3/uL (ref 0.0–0.4)
Eos: 3 %
Hematocrit: 41.4 % (ref 34.0–46.6)
Hemoglobin: 14 g/dL (ref 11.1–15.9)
Immature Grans (Abs): 0 10*3/uL (ref 0.0–0.1)
Immature Granulocytes: 0 %
LYMPHS ABS: 3 10*3/uL (ref 0.7–3.1)
Lymphs: 27 %
MCH: 31.4 pg (ref 26.6–33.0)
MCHC: 33.8 g/dL (ref 31.5–35.7)
MCV: 93 fL (ref 79–97)
MONOCYTES: 6 %
MONOS ABS: 0.7 10*3/uL (ref 0.1–0.9)
Neutrophils Absolute: 7.3 10*3/uL — ABNORMAL HIGH (ref 1.4–7.0)
Neutrophils: 64 %
PLATELETS: 310 10*3/uL (ref 150–379)
RBC: 4.46 x10E6/uL (ref 3.77–5.28)
RDW: 14.3 % (ref 12.3–15.4)
WBC: 11.3 10*3/uL — AB (ref 3.4–10.8)

## 2017-02-01 LAB — LIPID PANEL
CHOLESTEROL TOTAL: 206 mg/dL — AB (ref 100–199)
Chol/HDL Ratio: 3.1 ratio (ref 0.0–4.4)
HDL: 66 mg/dL (ref >39)
LDL CALC: 110 mg/dL — AB (ref 0–99)
TRIGLYCERIDES: 149 mg/dL (ref 0–149)
VLDL CHOLESTEROL CAL: 30 mg/dL (ref 5–40)

## 2017-02-01 LAB — TSH: TSH: 1.62 u[IU]/mL (ref 0.450–4.500)

## 2017-02-01 LAB — VITAMIN B12: Vitamin B-12: 323 pg/mL (ref 232–1245)

## 2017-02-01 NOTE — Telephone Encounter (Signed)
-----   Message from Clent Demark, PA-C sent at 02/01/2017  8:43 AM EST ----- Cholesterol mildly elevated. Continue on atorvastatin but reduce amount of fatty/fried foods. Also, White Blood Cells are slightly elevated. Please have her come in soon to work up source of possible infection.

## 2017-02-01 NOTE — Telephone Encounter (Signed)
Patient called office and was provided with lab results, scheduled to come in Monday for additional work up. Nat Christen, CMA

## 2017-02-01 NOTE — Telephone Encounter (Signed)
Called patient on both numbers on file and neither of them have a voicemail box that set up yet, Will call patient once more before mailing results. Nat Christen, CMA

## 2017-02-05 ENCOUNTER — Ambulatory Visit (INDEPENDENT_AMBULATORY_CARE_PROVIDER_SITE_OTHER): Payer: PPO | Admitting: Physician Assistant

## 2017-02-05 ENCOUNTER — Encounter (INDEPENDENT_AMBULATORY_CARE_PROVIDER_SITE_OTHER): Payer: Self-pay | Admitting: Physician Assistant

## 2017-02-05 VITALS — BP 138/76 | HR 76 | Temp 98.2°F | Resp 18 | Ht 65.0 in | Wt 165.0 lb

## 2017-02-05 DIAGNOSIS — M79672 Pain in left foot: Secondary | ICD-10-CM

## 2017-02-05 DIAGNOSIS — K089 Disorder of teeth and supporting structures, unspecified: Secondary | ICD-10-CM

## 2017-02-05 DIAGNOSIS — D72829 Elevated white blood cell count, unspecified: Secondary | ICD-10-CM

## 2017-02-05 LAB — POCT URINALYSIS DIPSTICK
BILIRUBIN UA: NEGATIVE
GLUCOSE UA: NEGATIVE
Ketones, UA: NEGATIVE
LEUKOCYTES UA: NEGATIVE
NITRITE UA: NEGATIVE
Protein, UA: NEGATIVE
RBC UA: NEGATIVE
Spec Grav, UA: 1.01 (ref 1.010–1.025)
UROBILINOGEN UA: 0.2 U/dL
pH, UA: 5 (ref 5.0–8.0)

## 2017-02-05 MED ORDER — ACETAMINOPHEN-CODEINE #3 300-30 MG PO TABS: 1.0000 | ORAL_TABLET | ORAL | 0 refills | Status: AC | PRN

## 2017-02-05 MED ORDER — AZITHROMYCIN 250 MG PO TABS
ORAL_TABLET | ORAL | 0 refills | Status: DC
Start: 2017-02-05 — End: 2017-03-12

## 2017-02-05 NOTE — Patient Instructions (Addendum)
Please visit [email or web address] to look for a dentist that may be able to accept your insurance.  Foot Pain Many things can cause foot pain. Some common causes are:  An injury.  A sprain.  Arthritis.  Blisters.  Bunions.  Follow these instructions at home: Pay attention to any changes in your symptoms. Take these actions to help with your discomfort:  If directed, put ice on the affected area: ? Put ice in a plastic bag. ? Place a towel between your skin and the bag. ? Leave the ice on for 15-20 minutes, 3?4 times a day for 2 days.  Take over-the-counter and prescription medicines only as told by your health care provider.  Wear comfortable, supportive shoes that fit you well. Do not wear high heels.  Do not stand or walk for long periods of time.  Do not lift a lot of weight. This can put added pressure on your feet.  Do stretches to relieve foot pain and stiffness as told by your health care provider.  Rub your foot gently.  Keep your feet clean and dry.  Contact a health care provider if:  Your pain does not get better after a few days of self-care.  Your pain gets worse.  You cannot stand on your foot. Get help right away if:  Your foot is numb or tingling.  Your foot or toes are swollen.  Your foot or toes turn white or blue.  You have warmth and redness along your foot. This information is not intended to replace advice given to you by your health care provider. Make sure you discuss any questions you have with your health care provider. Document Released: 04/09/2015 Document Revised: 08/19/2015 Document Reviewed: 04/08/2014 Elsevier Interactive Patient Education  [employer].

## 2017-02-05 NOTE — Progress Notes (Signed)
Subjective:  Patient ID: [name], female    DOB: 02/13/1951  Age: 66 y.o. MRN: [id number]  CC: leukocytosis  HPI  Elisama Thissen is a 66 y.o. female with a medical history of aortic valve vegetation, arthritis, heart murmur, HLD, HTN, renal infarct, OA of L spine, and Left meniscus tear presents on f/u of leukocytosis found recently on lab exam.  Patient believes elevation of WBC attributed to poor dentition.  WBC 11.3K. Patient also endorses urinary frequency but she is on diuretic. Does not endorse dysuria, flank pain, back pain, or f/c/n/v.      Complains of left forefoot pain at the sole. Worse with weigh bearing. Onset two weeks ago. Has no other symptoms or complaints.    Outpatient Medications Prior to Visit  Medication Sig Dispense Refill  . acetaminophen (TYLENOL) 500 MG tablet Take 1 tablet (500 mg total) every 8 (eight) hours as needed for up to 7 days by mouth. 21 tablet 0  . acetaminophen-codeine (TYLENOL #3) 300-30 MG tablet TAKE 1 TABLET BY MOUTH EVERY 8 HOURS AS NEEDED FOR MODERATE PAIN 60 tablet 1  . amLODipine (NORVASC) 10 MG tablet Take 1 tablet (10 mg total) daily by mouth. 30 tablet 11  . aspirin EC 81 MG tablet Take 1 tablet (81 mg total) daily by mouth. 30 tablet 11  . atorvastatin (LIPITOR) 40 MG tablet TAKE 1 TABLET(40 MG) BY MOUTH DAILY AT 6 PM 30 tablet 11  . carvedilol (COREG) 12.5 MG tablet TAKE 1 TABLET(12.5 MG) BY MOUTH TWICE DAILY WITH A MEAL 60 tablet 11  . fluticasone (FLONASE) 50 MCG/ACT nasal spray Place 2 sprays into both nostrils daily. 16 g 5  . furosemide (LASIX) 20 MG tablet Take 1 tablet (20 mg total) by mouth daily. 7 tablet 0  . hydrochlorothiazide (HYDRODIURIL) 25 MG tablet Take 1 tablet (25 mg total) daily by mouth. Take on tablet in the morning. 30 tablet 11  . loratadine (CLARITIN) 10 MG tablet Take 1 tablet (10 mg total) by mouth daily. 30 tablet 5  . Multiple Vitamin (MULITIVITAMIN WITH MINERALS) TABS Take 1 tablet by mouth daily.     . potassium chloride SA (K-DUR,KLOR-CON) 20 MEQ tablet Take 1 tablet (20 mEq total) daily by mouth. 30 tablet 0  . tiZANidine (ZANAFLEX) 4 MG tablet TAKE 1 TABLET(4 MG) BY MOUTH EVERY 8 HOURS AS NEEDED FOR MUSCLE SPASMS 30 tablet 0   No facility-administered medications prior to visit.      ROS Review of Systems  Constitutional: Negative for chills, fever and malaise/fatigue.  HENT:       Poor dentition  Eyes: Negative for blurred vision.  Respiratory: Negative for shortness of breath.   Cardiovascular: Negative for chest pain and palpitations.  Gastrointestinal: Negative for abdominal pain and nausea.  Genitourinary: Negative for dysuria and hematuria.  Musculoskeletal: Negative for joint pain and myalgias.  Skin: Negative for rash.  Neurological: Negative for tingling and headaches.  Psychiatric/Behavioral: Negative for depression. The patient is not nervous/anxious.     Objective:  BP 138/76 (BP Location: Left Arm, Patient Position: Sitting, Cuff Size: Normal)   Pulse 76   Temp 98.2 F (36.8 C) (Oral)   Resp 18   Ht 5\' 5"  (1.651 m)   Wt 165 lb (74.8 kg)   SpO2 99%   BMI 27.46 kg/m   BP/Weight 02/05/2017 01/31/2017 40/98/1191  Systolic BP 478 295 621  Diastolic BP 76 77 83  Wt. (Lbs) 165 164.4 155  BMI 27.46 27.36  25.79      Physical Exam  Constitutional: She is oriented to person, place, and time.  Well developed, well nourished, NAD, polite  HENT:  Head: Normocephalic and atraumatic.  Severely decayed teeth and severe periodontal disease  Eyes: No scleral icterus.  Neck: Normal range of motion. Neck supple. No thyromegaly present.  Cardiovascular: Normal rate, regular rhythm and normal heart sounds.  Pulmonary/Chest: Effort normal and breath sounds normal.  Abdominal: Soft. Bowel sounds are normal. There is no tenderness.  Musculoskeletal: She exhibits no edema.  Neurological: She is alert and oriented to person, place, and time.  Skin: Skin is warm and  dry. No rash noted. No erythema. No pallor.  Psychiatric: She has a normal mood and affect. Her behavior is normal. Thought content normal.  Vitals reviewed.    Assessment & Plan:   1. Leukocytosis, unspecified type - Urinalysis Dipstick negative in clinic today. - Likely attributed to poor dentition  2. Poor dentition - I have advised patient to visit [email or web address] to look for a dentist that may accept her insurance.  - Begin Azithromycin 250 mg, take two tablets on day one then one tablet daily thereafter.  3. Left foot pain - DG left foot complete - Advised patient to wear shoes with proper arch support - Will refer to podiatry based on XR result  Follow-up: Return in about 2 weeks (around 02/19/2017), or if symptoms worsen or fail to improve, for vaccinations .   Clent Demark PA

## 2017-02-07 ENCOUNTER — Ambulatory Visit (HOSPITAL_COMMUNITY)
Admission: RE | Admit: 2017-02-07 | Discharge: 2017-02-07 | Disposition: A | Discharge status: Home / Self Care | Payer: PPO | Source: Ambulatory Visit | Attending: Physician Assistant | Admitting: Physician Assistant

## 2017-02-07 DIAGNOSIS — I739 Peripheral vascular disease, unspecified: Secondary | ICD-10-CM | POA: Diagnosis not present

## 2017-02-07 DIAGNOSIS — M7989 Other specified soft tissue disorders: Secondary | ICD-10-CM | POA: Insufficient documentation

## 2017-02-07 DIAGNOSIS — M79672 Pain in left foot: Secondary | ICD-10-CM | POA: Insufficient documentation

## 2017-02-09 ENCOUNTER — Telehealth (INDEPENDENT_AMBULATORY_CARE_PROVIDER_SITE_OTHER): Payer: Self-pay

## 2017-02-09 NOTE — Telephone Encounter (Signed)
Patient aware of Xray results not showing any acute abnormality only PVD. Nat Christen, CMA

## 2017-02-09 NOTE — Telephone Encounter (Signed)
-----   Message from Clent Demark, PA-C sent at 02/08/2017  8:42 AM EST ----- No acute bony or joint abnormality identified. Only peripheral vascular disease.

## 2017-02-19 ENCOUNTER — Ambulatory Visit (INDEPENDENT_AMBULATORY_CARE_PROVIDER_SITE_OTHER): Payer: Self-pay | Admitting: Physician Assistant

## 2017-02-21 ENCOUNTER — Ambulatory Visit (INDEPENDENT_AMBULATORY_CARE_PROVIDER_SITE_OTHER): Payer: Self-pay | Admitting: Physician Assistant

## 2017-03-07 ENCOUNTER — Ambulatory Visit (INDEPENDENT_AMBULATORY_CARE_PROVIDER_SITE_OTHER): Payer: Self-pay | Admitting: Physician Assistant

## 2017-03-12 ENCOUNTER — Other Ambulatory Visit: Payer: Self-pay

## 2017-03-12 ENCOUNTER — Ambulatory Visit (INDEPENDENT_AMBULATORY_CARE_PROVIDER_SITE_OTHER): Payer: PPO | Admitting: Physician Assistant

## 2017-03-12 ENCOUNTER — Encounter (INDEPENDENT_AMBULATORY_CARE_PROVIDER_SITE_OTHER): Payer: Self-pay | Admitting: Physician Assistant

## 2017-03-12 VITALS — BP 181/75 | HR 71 | Temp 98.0°F | Wt 164.8 lb

## 2017-03-12 DIAGNOSIS — I10 Essential (primary) hypertension: Secondary | ICD-10-CM | POA: Diagnosis not present

## 2017-03-12 DIAGNOSIS — R350 Frequency of micturition: Secondary | ICD-10-CM | POA: Diagnosis not present

## 2017-03-12 DIAGNOSIS — Z23 Encounter for immunization: Secondary | ICD-10-CM

## 2017-03-12 DIAGNOSIS — K089 Disorder of teeth and supporting structures, unspecified: Secondary | ICD-10-CM

## 2017-03-12 DIAGNOSIS — D72829 Elevated white blood cell count, unspecified: Secondary | ICD-10-CM | POA: Diagnosis not present

## 2017-03-12 DIAGNOSIS — M79672 Pain in left foot: Secondary | ICD-10-CM | POA: Diagnosis not present

## 2017-03-12 LAB — POCT URINALYSIS DIPSTICK
Bilirubin, UA: NEGATIVE
Glucose, UA: NEGATIVE
KETONES UA: NEGATIVE
Leukocytes, UA: NEGATIVE
NITRITE UA: NEGATIVE
PH UA: 5.5 (ref 5.0–8.0)
PROTEIN UA: NEGATIVE
SPEC GRAV UA: 1.015 (ref 1.010–1.025)
UROBILINOGEN UA: 0.2 U/dL

## 2017-03-12 MED ORDER — ETODOLAC 200 MG PO CAPS: 200.0000 mg | ORAL_CAPSULE | Freq: Two times a day (BID) | ORAL | 1 refills | Status: DC

## 2017-03-12 MED ORDER — HYDROCHLOROTHIAZIDE 25 MG PO TABS: 25.0000 mg | ORAL_TABLET | Freq: Every day | ORAL | 11 refills | Status: DC

## 2017-03-12 MED ORDER — CARVEDILOL 12.5 MG PO TABS: ORAL_TABLET | ORAL | 11 refills | Status: DC

## 2017-03-12 MED ORDER — AMLODIPINE BESYLATE 10 MG PO TABS: 10.0000 mg | ORAL_TABLET | Freq: Every day | ORAL | 11 refills | Status: DC

## 2017-03-12 MED ORDER — DOXYCYCLINE HYCLATE 100 MG PO TABS: 100.0000 mg | ORAL_TABLET | Freq: Two times a day (BID) | ORAL | 0 refills | Status: DC

## 2017-03-12 NOTE — Patient Instructions (Signed)

## 2017-03-12 NOTE — Progress Notes (Signed)
Subjective:  Patient ID: [name], female    DOB: [birth year]  Age: 66 y.o. MRN: [id number]  CC: lab explanation  HPI  Sahira Wrightis a 66 y.o.femalewith a medical history of aortic valve vegetation, arthritis, heart murmur, HLD, HTN, renal infarct, OA of L spine, and Left meniscus tear presents on f/u of laboratory results. LDL 110 and WBC 11.3.  Has very poor dentition and believes her mild leukocytosis is attributed to her poor dentition. Says she is waiting for her insurance to be activated in one month before she can get her teeth fixed. Complains of urinary frequency without dysuria, flank pain, back pain, or f/c/n/v.     Outpatient Medications Prior to Visit  Medication Sig Dispense Refill  . amLODipine (NORVASC) 10 MG tablet Take 1 tablet (10 mg total) daily by mouth. 30 tablet 11  . aspirin EC 81 MG tablet Take 1 tablet (81 mg total) daily by mouth. 30 tablet 11  . atorvastatin (LIPITOR) 40 MG tablet TAKE 1 TABLET(40 MG) BY MOUTH DAILY AT 6 PM 30 tablet 11  . carvedilol (COREG) 12.5 MG tablet TAKE 1 TABLET(12.5 MG) BY MOUTH TWICE DAILY WITH A MEAL 60 tablet 11  . fluticasone (FLONASE) 50 MCG/ACT nasal spray Place 2 sprays into both nostrils daily. 16 g 5  . hydrochlorothiazide (HYDRODIURIL) 25 MG tablet Take 1 tablet (25 mg total) daily by mouth. Take on tablet in the morning. 30 tablet 11  . loratadine (CLARITIN) 10 MG tablet Take 1 tablet (10 mg total) by mouth daily. 30 tablet 5  . Multiple Vitamin (MULITIVITAMIN WITH MINERALS) TABS Take 1 tablet by mouth daily.    . potassium chloride SA (K-DUR,KLOR-CON) 20 MEQ tablet Take 1 tablet (20 mEq total) daily by mouth. 30 tablet 0  . azithromycin (ZITHROMAX) 250 MG tablet Take two tablets today and one tablet daily thereafter. 6 tablet 0  . acetaminophen-codeine (TYLENOL #3) 300-30 MG tablet TAKE 1 TABLET BY MOUTH EVERY 8 HOURS AS NEEDED FOR MODERATE PAIN (Patient not taking: Reported on 03/12/2017) 60 tablet 1  . tiZANidine  (ZANAFLEX) 4 MG tablet TAKE 1 TABLET(4 MG) BY MOUTH EVERY 8 HOURS AS NEEDED FOR MUSCLE SPASMS (Patient not taking: Reported on 03/12/2017) 30 tablet 0   No facility-administered medications prior to visit.      ROS Review of Systems  Constitutional: Negative for chills, fever and malaise/fatigue.  Eyes: Negative for blurred vision.  Respiratory: Negative for shortness of breath.   Cardiovascular: Negative for chest pain and palpitations.  Gastrointestinal: Negative for abdominal pain and nausea.  Genitourinary: Positive for frequency. Negative for dysuria and hematuria.  Musculoskeletal: Negative for joint pain and myalgias.  Skin: Negative for rash.  Neurological: Negative for tingling and headaches.  Psychiatric/Behavioral: Negative for depression. The patient is not nervous/anxious.     Objective:  BP (!) 181/75 (BP Location: Left Arm, Patient Position: Sitting, Cuff Size: Normal)   Pulse 71   Temp 98 F (36.7 C) (Oral)   Wt 164 lb 12.8 oz (74.8 kg)   SpO2 100%   BMI 27.42 kg/m   BP/Weight 03/12/2017 02/05/2017 26/09/1243  Systolic BP 809 983 382  Diastolic BP 75 76 77  Wt. (Lbs) 164.8 165 164.4  BMI 27.42 27.46 27.36      Physical Exam  Constitutional: She is oriented to person, place, and time.  Well developed, well nourished, NAD, polite  HENT:  Head: Normocephalic and atraumatic.  Severely decayed teeth. Missing teeth. Severe periodontal disease. No abscess or  facial/throat swelling.  Eyes: No scleral icterus.  Neck: Normal range of motion. Neck supple. No thyromegaly present.  Cardiovascular: Normal rate, regular rhythm and normal heart sounds.  Pulmonary/Chest: Effort normal and breath sounds normal. No respiratory distress. She has no wheezes.  Musculoskeletal: She exhibits no edema.  Normal appearing left foot.  Neurological: She is alert and oriented to person, place, and time. No cranial nerve deficit. Coordination normal.  Skin: Skin is warm and dry.  No rash noted. No erythema. No pallor.  Psychiatric: She has a normal mood and affect. Her behavior is normal. Thought content normal.  Vitals reviewed.    Assessment & Plan:   1. Poor dentition - Begin doxycycline 100 mg BID x 7 days. - Awaiting for pt insurance to activate in one month before she can see dentist.  2. Urinary frequency - Urinalysis Dipstick with trace blood. Repeat at next visit - Likely attributed to HTN  3. Leukocytosis, unspecified type - CBC with Differential - Urinalysis Dipstick with trace blood. Repeat at next visit.   4. Left foot pain - Begin etodolac (LODINE) 200 MG capsule; Take 1 capsule (200 mg total) by mouth 2 (two) times daily.  Dispense: 14 capsule; Refill: 1  5. Hypertension, unspecified type - I have strongly advised patient to take all her anti-hypertensives as directed.  - hydrochlorothiazide (HYDRODIURIL) 25 MG tablet; Take 1 tablet (25 mg total) by mouth daily. Take on tablet in the morning.  Dispense: 30 tablet; Refill: 11 - carvedilol (COREG) 12.5 MG tablet; TAKE 1 TABLET(12.5 MG) BY MOUTH TWICE DAILY WITH A MEAL  Dispense: 60 tablet; Refill: 11 - amLODipine (NORVASC) 10 MG tablet; Take 1 tablet (10 mg total) by mouth daily.  Dispense: 30 tablet; Refill: 11  6. Need for prophylactic vaccination against Streptococcus pneumoniae (pneumococcus) - Pneumococcal conjugate vaccine 13-valent  7. Need for prophylactic vaccination and inoculation against influenza - Flu Vaccine QUAD 6+ mos PF IM (Fluarix Quad PF); Future   Meds ordered this encounter  Medications  . etodolac (LODINE) 200 MG capsule    Sig: Take 1 capsule (200 mg total) by mouth 2 (two) times daily.    Dispense:  14 capsule    Refill:  1    Order Specific Question:   Supervising Provider    Answer:   Tresa Garter [id number]  . hydrochlorothiazide (HYDRODIURIL) 25 MG tablet    Sig: Take 1 tablet (25 mg total) by mouth daily. Take on tablet in the morning.     Dispense:  30 tablet    Refill:  11    Order Specific Question:   Supervising Provider    Answer:   Tresa Garter [id number]  . carvedilol (COREG) 12.5 MG tablet    Sig: TAKE 1 TABLET(12.5 MG) BY MOUTH TWICE DAILY WITH A MEAL    Dispense:  60 tablet    Refill:  11    Order Specific Question:   Supervising Provider    Answer:   Tresa Garter [id number]  . amLODipine (NORVASC) 10 MG tablet    Sig: Take 1 tablet (10 mg total) by mouth daily.    Dispense:  30 tablet    Refill:  11    Order Specific Question:   Supervising Provider    Answer:   Tresa Garter [[id number]]    Follow-up: 4 weeks for HTN  Clent Demark PA

## 2017-03-13 ENCOUNTER — Telehealth (INDEPENDENT_AMBULATORY_CARE_PROVIDER_SITE_OTHER): Payer: Self-pay

## 2017-03-13 LAB — CBC WITH DIFFERENTIAL/PLATELET
BASOS ABS: 0 10*3/uL (ref 0.0–0.2)
BASOS: 0 %
EOS (ABSOLUTE): 0.2 10*3/uL (ref 0.0–0.4)
EOS: 2 %
HEMATOCRIT: 38 % (ref 34.0–46.6)
HEMOGLOBIN: 12.3 g/dL (ref 11.1–15.9)
IMMATURE GRANS (ABS): 0 10*3/uL (ref 0.0–0.1)
Immature Granulocytes: 0 %
LYMPHS ABS: 3.2 10*3/uL — AB (ref 0.7–3.1)
LYMPHS: 29 %
MCH: 31.1 pg (ref 26.6–33.0)
MCHC: 32.4 g/dL (ref 31.5–35.7)
MCV: 96 fL (ref 79–97)
MONOCYTES: 6 %
Monocytes Absolute: 0.7 10*3/uL (ref 0.1–0.9)
NEUTROS ABS: 6.7 10*3/uL (ref 1.4–7.0)
Neutrophils: 63 %
Platelets: 302 10*3/uL (ref 150–379)
RBC: 3.96 x10E6/uL (ref 3.77–5.28)
RDW: 13.6 % (ref 12.3–15.4)
WBC: 11 10*3/uL — ABNORMAL HIGH (ref 3.4–10.8)

## 2017-03-13 NOTE — Telephone Encounter (Signed)
Left patient a message detailing the following, Anemia is stable. Likely from anemia from lower functioning kidneys. Keep BP controlled to avoid further damage to the kidneys. Nat Christen, CMA

## 2017-03-24 ENCOUNTER — Emergency Department (HOSPITAL_COMMUNITY): Payer: PPO

## 2017-03-24 ENCOUNTER — Emergency Department (HOSPITAL_COMMUNITY)
Admission: EM | Admit: 2017-03-24 | Discharge: 2017-03-24 | Disposition: A | Discharge status: Home / Self Care | Payer: PPO | Attending: Emergency Medicine | Admitting: Emergency Medicine

## 2017-03-24 ENCOUNTER — Encounter (HOSPITAL_COMMUNITY): Payer: Self-pay

## 2017-03-24 DIAGNOSIS — I10 Essential (primary) hypertension: Secondary | ICD-10-CM | POA: Insufficient documentation

## 2017-03-24 DIAGNOSIS — Z87891 Personal history of nicotine dependence: Secondary | ICD-10-CM | POA: Diagnosis not present

## 2017-03-24 DIAGNOSIS — W010XXA Fall on same level from slipping, tripping and stumbling without subsequent striking against object, initial encounter: Secondary | ICD-10-CM | POA: Diagnosis not present

## 2017-03-24 DIAGNOSIS — S82831A Other fracture of upper and lower end of right fibula, initial encounter for closed fracture: Secondary | ICD-10-CM | POA: Diagnosis not present

## 2017-03-24 DIAGNOSIS — S89301A Unspecified physeal fracture of lower end of right fibula, initial encounter for closed fracture: Secondary | ICD-10-CM | POA: Diagnosis not present

## 2017-03-24 DIAGNOSIS — M25571 Pain in right ankle and joints of right foot: Secondary | ICD-10-CM | POA: Diagnosis not present

## 2017-03-24 DIAGNOSIS — S82301A Unspecified fracture of lower end of right tibia, initial encounter for closed fracture: Secondary | ICD-10-CM | POA: Insufficient documentation

## 2017-03-24 DIAGNOSIS — G8911 Acute pain due to trauma: Secondary | ICD-10-CM | POA: Diagnosis not present

## 2017-03-24 DIAGNOSIS — Y939 Activity, unspecified: Secondary | ICD-10-CM | POA: Insufficient documentation

## 2017-03-24 DIAGNOSIS — Z79899 Other long term (current) drug therapy: Secondary | ICD-10-CM | POA: Insufficient documentation

## 2017-03-24 DIAGNOSIS — S99911A Unspecified injury of right ankle, initial encounter: Secondary | ICD-10-CM | POA: Diagnosis present

## 2017-03-24 DIAGNOSIS — S8002XA Contusion of left knee, initial encounter: Secondary | ICD-10-CM | POA: Diagnosis not present

## 2017-03-24 DIAGNOSIS — S82201A Unspecified fracture of shaft of right tibia, initial encounter for closed fracture: Secondary | ICD-10-CM

## 2017-03-24 DIAGNOSIS — S82201J Unspecified fracture of shaft of right tibia, subsequent encounter for open fracture type IIIA, IIIB, or IIIC with delayed healing: Secondary | ICD-10-CM | POA: Diagnosis not present

## 2017-03-24 DIAGNOSIS — S82401A Unspecified fracture of shaft of right fibula, initial encounter for closed fracture: Secondary | ICD-10-CM

## 2017-03-24 DIAGNOSIS — Y999 Unspecified external cause status: Secondary | ICD-10-CM | POA: Insufficient documentation

## 2017-03-24 DIAGNOSIS — Y92000 Kitchen of unspecified non-institutional (private) residence as  the place of occurrence of the external cause: Secondary | ICD-10-CM | POA: Diagnosis not present

## 2017-03-24 DIAGNOSIS — Z7982 Long term (current) use of aspirin: Secondary | ICD-10-CM | POA: Diagnosis not present

## 2017-03-24 DIAGNOSIS — Z91040 Latex allergy status: Secondary | ICD-10-CM | POA: Insufficient documentation

## 2017-03-24 MED ORDER — OXYCODONE-ACETAMINOPHEN 5-325 MG PO TABS
1.0000 | ORAL_TABLET | ORAL | Status: DC | PRN
  Administered 2017-03-24: 1 via ORAL
  Filled 2017-03-24: qty 1

## 2017-03-24 MED ORDER — OXYCODONE-ACETAMINOPHEN 5-325 MG PO TABS: 1.0000 | ORAL_TABLET | ORAL | 0 refills | Status: DC | PRN

## 2017-03-24 MED ORDER — OXYCODONE-ACETAMINOPHEN 5-325 MG PO TABS
2.0000 | ORAL_TABLET | Freq: Once | ORAL | Status: AC
  Administered 2017-03-24: 2 via ORAL
  Filled 2017-03-24: qty 2

## 2017-03-24 NOTE — ED Provider Notes (Signed)
[place] EMERGENCY DEPARTMENT Provider Note   CSN: [id number] Arrival date & time: 03/24/17  0745     History   Chief Complaint Chief Complaint  Patient presents with  . Ankle Pain    HPI Carreen Milius is a 66 y.o. female.  HPI  66 year old female presents today complaining of right ankle injury.  She states that last night she slipped in her kitchen on water and fell.  She denies any other injury.  She did not strike her head.  She is complaining of pain in the right ankle.  She had an x-Maridel Pixler obtained prior to my evaluation that shows a right distal tibia fracture with proximal fibula fracture.  She denies any numbness, tingling, or weakness in the extremity.  She is not on blood thinners.  She reports Dr. Maxie Better is her orthopedic surgery.  Past Medical History:  Diagnosis Date  . Abnormal EKG 2012   hospitalized for T wave inversion in lateral leads with MSK chest pains, normal ECHO, negative trops, no cardiology consult. recent EKG 7/15 stable T wave inversions.   . 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 Arthritis 2000  . Heart murmur   . Hyperlipidemia   . Hypertension 2010  . Meniscus tear    L KNEE    Patient Active Problem List   Diagnosis Date Noted  . 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 HLD (hyperlipidemia)   . Aortic atherosclerosis ([place])   . Right-sided low back pain without sciatica 07/08/2015  . 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 Bilateral leg pain 06/14/2015  . Seasonal allergies 03/09/2015  . Hypopigmentation 03/09/2015  . Gingivitis 11/10/2014  . Osteoarthritis of left knee 07/08/2014  . DJD (degenerative joint disease), lumbar 07/08/2014  . Osteoarthritis of right knee 07/08/2014  . Foot swelling 06/11/2014  . Pain due to onychomycosis of toenail 06/11/2014  . Borborygmi 06/11/2014  . Allergic rhinoconjunctivitis of both eyes 06/11/2014  . Nearsightedness 04/27/2014  .  Abnormal EKG   . Hypertension 12/11/2013    Past Surgical History:  Procedure Laterality Date  . ABDOMINAL SURGERY  2010   for bowel blockage  . KNEE ARTHROSCOPY Left 09/25/2013   Procedure: LEFT KNEE ARTHROSCOPY WITH PARTIAL MEDIAL AND LATERAL  MENISCECTOMY/DEBRIDEMENT/MICRO FRACTURE MEDIAL FEMORAL CONDYLE, REMOVAL OF OSTEOCONDRAL FRAGMENT;  Surgeon: Johnn Hai, MD;  Location: [facility] ORS;  Service: Orthopedics;  Laterality: Left;  . KNEE SURGERY Right 1999  . TEE WITHOUT CARDIOVERSION N/A 10/04/2015   Procedure: TRANSESOPHAGEAL ECHOCARDIOGRAM (TEE);  Surgeon: Sueanne Margarita, MD;  Location: [facility] ENDOSCOPY;  Service: Cardiovascular;  Laterality: N/A;  . TUBAL LIGATION  1979    OB History    No data available       Home Medications    Prior to Admission medications   Medication Sig Start Date End Date Taking? Authorizing Provider  amLODipine (NORVASC) 10 MG tablet Take 1 tablet (10 mg total) by mouth daily. 03/12/17  Yes Clent Demark, PA-C  aspirin EC 81 MG tablet Take 1 tablet (81 mg total) daily by mouth. 01/31/17  Yes Clent Demark, PA-C  atorvastatin (LIPITOR) 40 MG tablet TAKE 1 TABLET(40 MG) BY MOUTH DAILY AT 6 PM 10/12/16  Yes Funches, Josalyn, MD  carvedilol (COREG) 12.5 MG tablet TAKE 1 TABLET(12.5 MG) BY MOUTH TWICE DAILY WITH A MEAL 03/12/17  Yes Clent Demark, PA-C  fluticasone [facility]) 50 MCG/ACT nasal spray Place 2 sprays into both  nostrils daily. 07/11/16  Yes Funches, Josalyn, MD  hydrochlorothiazide (HYDRODIURIL) 25 MG tablet Take 1 tablet (25 mg total) by mouth daily. Take on tablet in the morning. 03/12/17  Yes Clent Demark, PA-C  Multiple Vitamin (MULITIVITAMIN WITH MINERALS) TABS Take 1 tablet by mouth daily.   Yes [provider]  potassium chloride SA (K-DUR,KLOR-CON) 20 MEQ tablet Take 1 tablet (20 mEq total) daily by mouth. 01/31/17  Yes Clent Demark, PA-C  etodolac (LODINE) 200 MG capsule Take 1 capsule (200 mg total) by mouth 2  (two) times daily. Patient not taking: Reported on 03/24/2017 03/12/17   Clent Demark, PA-C  loratadine (CLARITIN) 10 MG tablet Take 1 tablet (10 mg total) by mouth daily. Patient not taking: Reported on 03/24/2017 07/11/16   Boykin Nearing, MD  tiZANidine (ZANAFLEX) 4 MG tablet TAKE 1 TABLET(4 MG) BY MOUTH EVERY 8 HOURS AS NEEDED FOR MUSCLE SPASMS Patient not taking: Reported on 03/12/2017 09/26/16   Boykin Nearing, MD    Family History Family History  Problem Relation Age of Onset  . Hypertension Mother   . Heart disease Mother        has a pacemaker   . Heart failure Father   . Hypertension Father   . Cancer Neg Hx   . Colon cancer Neg Hx     Social History Social History   Tobacco Use  . Smoking status: Former Smoker    Last attempt to quit: 07/29/2010    Years since quitting: 6.6  . Smokeless tobacco: Never Used  Substance Use Topics  . Alcohol use: No    Alcohol/week: 0.0 oz  . Drug use: No     Allergies   Shrimp [shellfish allergy]; Latex; and Penicillins   Review of Systems Review of Systems  Constitutional: Negative.   HENT: Negative.   Eyes: Negative.   Respiratory: Negative.  Negative for shortness of breath.   Cardiovascular: Negative.  Negative for chest pain.  Gastrointestinal: Negative.   Endocrine: Negative.   Genitourinary: Negative.   Musculoskeletal:       Right ankle pain  Neurological: Negative.   Hematological: Negative.   Psychiatric/Behavioral: Negative.   All other systems reviewed and are negative.    Physical Exam Updated Vital Signs BP (!) 142/69   Pulse 67   Temp 98.6 F (37 C) (Oral)   Resp 20   SpO2 98%   Physical Exam   ED Treatments / Results  Labs (all labs ordered are listed, but only abnormal results are displayed) Labs Reviewed - No data to display  EKG  EKG Interpretation None       Radiology Dg Tibia/fibula Right  Result Date: 03/24/2017 CLINICAL DATA:  Status post fall with right leg  pain. EXAM: RIGHT TIBIA AND FIBULA - 2 VIEW COMPARISON:  None. FINDINGS: There is displaced fracture of the distal tibial shaft. Mild displaced fracture of the fibular head is identified. Degenerative joint changes of the right knee are noted. Soft tissues are unremarkable. IMPRESSION: Fracture of the proximal fibula and distal tibia. Electronically Signed   By: Abelardo Diesel M.D.   On: 03/24/2017 09:14   Dg Ankle Complete Right  Result Date: 03/24/2017 CLINICAL DATA:  Right ankle pain starts poles fall. EXAM: RIGHT ANKLE - COMPLETE 3+ VIEW COMPARISON:  None. FINDINGS: There is displaced fracture of the distal tibial shaft. There is no dislocation. Soft tissues are unremarkable. IMPRESSION: Fracture of right tibia. Electronically Signed   By: Mallie Darting.D.  On: 03/24/2017 09:13    Procedures Procedures (including critical care time)  Medications Ordered in ED Medications  oxyCODONE-acetaminophen (PERCOCET/ROXICET) 5-325 MG per tablet 1 tablet (1 tablet Oral Given 03/24/17 0755)  oxyCODONE-acetaminophen (PERCOCET/ROXICET) 5-325 MG per tablet 2 tablet (not administered)     Initial Impression / Assessment and Plan / ED Course  I have reviewed the triage vital signs and the nursing notes.  Pertinent labs & imaging results that were available during my care of the patient were reviewed by me and considered in my medical decision making (see chart for details).     65 year old female with mechanical fall and right distal tibia and proximal fibula fracture.  Plan splint and will attempt to ambulate with crutches.  Fracture appears stable for follow-up with her orthopedist. Final Clinical Impressions(s) / ED Diagnoses   Final diagnoses:  Closed fracture of right tibia and fibula, initial encounter    ED Discharge Orders    None       Pattricia Boss, MD 03/24/17 1225

## 2017-03-24 NOTE — ED Triage Notes (Signed)
Pt presents with right ankle pain following mechanical fall due to water on floor this AM. Good PMS, no deformity or swelling at this time.

## 2017-03-24 NOTE — Discharge Instructions (Signed)
Do not weight bear on injured leg Elevate and use cold therapy. Call Dr. Bernadette Hoit office to schedule for recheck on Monday.  Return if worsened especially worse swelling, shortness of breath, or uncontrollable pain.

## 2017-03-24 NOTE — Progress Notes (Signed)
Orthopedic Tech Progress Note Patient Details:  [name] 06-09-1950 [id number]  Ortho Devices Type of Ortho Device: Ace wrap, Long leg splint, Crutches Ortho Device/Splint Interventions: Application   Post Interventions Patient Tolerated: Well, Ambulated well Instructions Provided: Poper ambulation with device, Care of device, Adjustment of device   [place] Pink 03/24/2017, 10:59 AM

## 2017-03-26 ENCOUNTER — Other Ambulatory Visit: Payer: Self-pay | Admitting: Internal Medicine

## 2017-03-26 DIAGNOSIS — M25561 Pain in right knee: Secondary | ICD-10-CM | POA: Diagnosis not present

## 2017-03-28 ENCOUNTER — Other Ambulatory Visit (INDEPENDENT_AMBULATORY_CARE_PROVIDER_SITE_OTHER): Payer: Self-pay | Admitting: Physician Assistant

## 2017-03-28 DIAGNOSIS — Z9181 History of falling: Secondary | ICD-10-CM | POA: Diagnosis not present

## 2017-03-28 DIAGNOSIS — S82244A Nondisplaced spiral fracture of shaft of right tibia, initial encounter for closed fracture: Secondary | ICD-10-CM | POA: Insufficient documentation

## 2017-03-28 DIAGNOSIS — E876 Hypokalemia: Secondary | ICD-10-CM

## 2017-03-28 NOTE — Telephone Encounter (Signed)
FWD to PCP. [name] S Dhalia Zingaro, CMA  

## 2017-04-04 ENCOUNTER — Telehealth (INDEPENDENT_AMBULATORY_CARE_PROVIDER_SITE_OTHER): Payer: Self-pay | Admitting: Physician Assistant

## 2017-04-04 NOTE — Telephone Encounter (Signed)
Patient requesting Home Services because she broke her leg and she lives in a 2 stories and need help . She contact [phone] and they told her that her pcp need to request the service .

## 2017-04-06 ENCOUNTER — Telehealth (INDEPENDENT_AMBULATORY_CARE_PROVIDER_SITE_OTHER): Payer: Self-pay | Admitting: Physician Assistant

## 2017-04-06 NOTE — Telephone Encounter (Signed)
FWD to PCP. Tempestt S Roberts, CMA  

## 2017-04-06 NOTE — Telephone Encounter (Signed)
Called patient to schedule appointment, patient did not answer. Nat Christen, CMA

## 2017-04-06 NOTE — Telephone Encounter (Signed)
Patient is concerning because  She was reading  the side effects of etodolac (LODINE) 200 MG and found that can cause Stroke . Please, call her

## 2017-04-09 ENCOUNTER — Other Ambulatory Visit: Payer: Self-pay | Admitting: Internal Medicine

## 2017-04-09 ENCOUNTER — Ambulatory Visit (INDEPENDENT_AMBULATORY_CARE_PROVIDER_SITE_OTHER): Payer: Self-pay | Admitting: Physician Assistant

## 2017-04-11 DIAGNOSIS — S82244D Nondisplaced spiral fracture of shaft of right tibia, subsequent encounter for closed fracture with routine healing: Secondary | ICD-10-CM | POA: Diagnosis not present

## 2017-04-13 NOTE — Telephone Encounter (Signed)
Patient states that she has stopped taking medication. She says the pain went away after she broke her foot. She asked what else she could take, urged patient to schedule appt to speak with provider about alternate options. She declined at this time as she still has a cast on her foot. She stated she would begin taking the medication again and if she has any issues she will call our office and schedule an appt. Nat Christen, CMA

## 2017-04-13 NOTE — Telephone Encounter (Signed)
This was prescribed for her foot pain. She may use another analgesic if she wishes.

## 2017-04-16 NOTE — Telephone Encounter (Signed)
Patient aware. Rosette Bellavance S Marlos Carmen, CMA  

## 2017-04-16 NOTE — Telephone Encounter (Signed)
She may use Aleve or Advil. Thank you.

## 2017-04-24 ENCOUNTER — Telehealth (INDEPENDENT_AMBULATORY_CARE_PROVIDER_SITE_OTHER): Payer: Self-pay | Admitting: Physician Assistant

## 2017-04-24 DIAGNOSIS — I10 Essential (primary) hypertension: Secondary | ICD-10-CM | POA: Diagnosis not present

## 2017-04-24 DIAGNOSIS — S82244D Nondisplaced spiral fracture of shaft of right tibia, subsequent encounter for closed fracture with routine healing: Secondary | ICD-10-CM | POA: Diagnosis not present

## 2017-04-24 DIAGNOSIS — Z9181 History of falling: Secondary | ICD-10-CM | POA: Diagnosis not present

## 2017-04-24 DIAGNOSIS — E785 Hyperlipidemia, unspecified: Secondary | ICD-10-CM | POA: Diagnosis not present

## 2017-04-24 NOTE — Telephone Encounter (Signed)
Pt called very upset, since we are auth a medical equipment with out her auth. She asking why we auth the equipment since she went to look for the specialist and was not refer by her pcp, she want for now only auth anything for her if she see the pcp first, just [place]

## 2017-04-24 NOTE — Telephone Encounter (Signed)
Noted  

## 2017-04-27 DIAGNOSIS — E785 Hyperlipidemia, unspecified: Secondary | ICD-10-CM | POA: Diagnosis not present

## 2017-04-27 DIAGNOSIS — Z9181 History of falling: Secondary | ICD-10-CM | POA: Diagnosis not present

## 2017-04-27 DIAGNOSIS — S82244D Nondisplaced spiral fracture of shaft of right tibia, subsequent encounter for closed fracture with routine healing: Secondary | ICD-10-CM | POA: Diagnosis not present

## 2017-04-27 DIAGNOSIS — I10 Essential (primary) hypertension: Secondary | ICD-10-CM | POA: Diagnosis not present

## 2017-04-30 DIAGNOSIS — E785 Hyperlipidemia, unspecified: Secondary | ICD-10-CM | POA: Diagnosis not present

## 2017-04-30 DIAGNOSIS — Z9181 History of falling: Secondary | ICD-10-CM | POA: Diagnosis not present

## 2017-04-30 DIAGNOSIS — I10 Essential (primary) hypertension: Secondary | ICD-10-CM | POA: Diagnosis not present

## 2017-04-30 DIAGNOSIS — S82244D Nondisplaced spiral fracture of shaft of right tibia, subsequent encounter for closed fracture with routine healing: Secondary | ICD-10-CM | POA: Diagnosis not present

## 2017-05-03 ENCOUNTER — Other Ambulatory Visit (INDEPENDENT_AMBULATORY_CARE_PROVIDER_SITE_OTHER): Payer: Self-pay | Admitting: Physician Assistant

## 2017-05-03 DIAGNOSIS — E876 Hypokalemia: Secondary | ICD-10-CM

## 2017-05-03 DIAGNOSIS — S82244D Nondisplaced spiral fracture of shaft of right tibia, subsequent encounter for closed fracture with routine healing: Secondary | ICD-10-CM | POA: Diagnosis not present

## 2017-05-03 DIAGNOSIS — S82392D Other fracture of lower end of left tibia, subsequent encounter for closed fracture with routine healing: Secondary | ICD-10-CM | POA: Diagnosis not present

## 2017-05-07 ENCOUNTER — Emergency Department (HOSPITAL_COMMUNITY)
Admission: EM | Admit: 2017-05-07 | Discharge: 2017-05-07 | Disposition: A | Discharge status: Home / Self Care | Payer: PPO | Attending: Emergency Medicine | Admitting: Emergency Medicine

## 2017-05-07 ENCOUNTER — Emergency Department (HOSPITAL_COMMUNITY): Payer: PPO

## 2017-05-07 ENCOUNTER — Encounter (HOSPITAL_COMMUNITY): Payer: Self-pay

## 2017-05-07 DIAGNOSIS — Z87891 Personal history of nicotine dependence: Secondary | ICD-10-CM | POA: Diagnosis not present

## 2017-05-07 DIAGNOSIS — R350 Frequency of micturition: Secondary | ICD-10-CM | POA: Insufficient documentation

## 2017-05-07 DIAGNOSIS — S82244D Nondisplaced spiral fracture of shaft of right tibia, subsequent encounter for closed fracture with routine healing: Secondary | ICD-10-CM | POA: Diagnosis not present

## 2017-05-07 DIAGNOSIS — I10 Essential (primary) hypertension: Secondary | ICD-10-CM | POA: Diagnosis not present

## 2017-05-07 DIAGNOSIS — Z91040 Latex allergy status: Secondary | ICD-10-CM | POA: Diagnosis not present

## 2017-05-07 DIAGNOSIS — Z79899 Other long term (current) drug therapy: Secondary | ICD-10-CM | POA: Diagnosis not present

## 2017-05-07 DIAGNOSIS — Z7982 Long term (current) use of aspirin: Secondary | ICD-10-CM | POA: Diagnosis not present

## 2017-05-07 DIAGNOSIS — Z9181 History of falling: Secondary | ICD-10-CM | POA: Diagnosis not present

## 2017-05-07 DIAGNOSIS — R109 Unspecified abdominal pain: Secondary | ICD-10-CM | POA: Insufficient documentation

## 2017-05-07 DIAGNOSIS — R1032 Left lower quadrant pain: Secondary | ICD-10-CM | POA: Diagnosis not present

## 2017-05-07 DIAGNOSIS — E785 Hyperlipidemia, unspecified: Secondary | ICD-10-CM | POA: Diagnosis not present

## 2017-05-07 LAB — CBC WITH DIFFERENTIAL/PLATELET
BASOS ABS: 0 10*3/uL (ref 0.0–0.1)
BASOS PCT: 0 %
Eosinophils Absolute: 0.3 10*3/uL (ref 0.0–0.7)
Eosinophils Relative: 3 %
HEMATOCRIT: 37.9 % (ref 36.0–46.0)
HEMOGLOBIN: 11.9 g/dL — AB (ref 12.0–15.0)
Lymphocytes Relative: 24 %
Lymphs Abs: 2.3 10*3/uL (ref 0.7–4.0)
MCH: 30.4 pg (ref 26.0–34.0)
MCHC: 31.4 g/dL (ref 30.0–36.0)
MCV: 96.7 fL (ref 78.0–100.0)
Monocytes Absolute: 0.4 10*3/uL (ref 0.1–1.0)
Monocytes Relative: 5 %
NEUTROS ABS: 6.4 10*3/uL (ref 1.7–7.7)
NEUTROS PCT: 68 %
Platelets: 288 10*3/uL (ref 150–400)
RBC: 3.92 MIL/uL (ref 3.87–5.11)
RDW: 14 % (ref 11.5–15.5)
WBC: 9.4 10*3/uL (ref 4.0–10.5)

## 2017-05-07 LAB — URINALYSIS, ROUTINE W REFLEX MICROSCOPIC
Bacteria, UA: NONE SEEN
Bilirubin Urine: NEGATIVE
Glucose, UA: NEGATIVE mg/dL
KETONES UR: NEGATIVE mg/dL
LEUKOCYTES UA: NEGATIVE
Nitrite: NEGATIVE
PH: 5 (ref 5.0–8.0)
Protein, ur: NEGATIVE mg/dL
Specific Gravity, Urine: 1.013 (ref 1.005–1.030)

## 2017-05-07 LAB — COMPREHENSIVE METABOLIC PANEL
ALBUMIN: 3.6 g/dL (ref 3.5–5.0)
ALT: 17 U/L (ref 14–54)
AST: 19 U/L (ref 15–41)
Alkaline Phosphatase: 112 U/L (ref 38–126)
Anion gap: 15 (ref 5–15)
BILIRUBIN TOTAL: 0.7 mg/dL (ref 0.3–1.2)
BUN: 12 mg/dL (ref 6–20)
CHLORIDE: 103 mmol/L (ref 101–111)
CO2: 24 mmol/L (ref 22–32)
Calcium: 9.4 mg/dL (ref 8.9–10.3)
Creatinine, Ser: 0.97 mg/dL (ref 0.44–1.00)
GFR calc Af Amer: >60 mL/min (ref >60)
GFR, EST NON AFRICAN AMERICAN: 59 mL/min — AB (ref >60)
Glucose, Bld: 101 mg/dL — ABNORMAL HIGH (ref 65–99)
POTASSIUM: 3.5 mmol/L (ref 3.5–5.1)
Sodium: 142 mmol/L (ref 135–145)
TOTAL PROTEIN: 7.1 g/dL (ref 6.5–8.1)

## 2017-05-07 LAB — LIPASE, BLOOD: LIPASE: 19 U/L (ref 11–51)

## 2017-05-07 MED ORDER — NAPROXEN 250 MG PO TABS: ORAL_TABLET | ORAL | 0 refills | Status: DC

## 2017-05-07 MED ORDER — CYCLOBENZAPRINE HCL 5 MG PO TABS: 5.0000 mg | ORAL_TABLET | Freq: Three times a day (TID) | ORAL | 0 refills | Status: DC | PRN

## 2017-05-07 MED ORDER — KETOROLAC TROMETHAMINE 15 MG/ML IJ SOLN
15.0000 mg | Freq: Once | INTRAMUSCULAR | Status: AC
  Administered 2017-05-07: 15 mg via INTRAVENOUS
  Filled 2017-05-07: qty 1

## 2017-05-07 NOTE — ED Provider Notes (Signed)
[place] EMERGENCY DEPARTMENT Provider Note   CSN: [id number] Arrival date & time: 05/07/17  0304  Time seen 03:40 AM   History   Chief Complaint Chief Complaint  Patient presents with  . Abdominal Pain    HPI [name] is a 67 y.o. female.  HPI reports left lateral side pain for the past few days.  She states nothing she does makes it worse, nothing she does makes it feel better, she describes the pain as soreness and states it is there constantly.  She denies nausea, vomiting, diarrhea, or dysuria.  She does describe frequency for the past 4-5 days.  She is unsure of fever.  Despite telling nursing staff she had this pain before when she had a UTI, she tells me she is never had it before.  Patient states she had a fracture of her ankle and right lower leg on December 29.  She is currently in a cast.  She states it is been 6 weeks since she has 2 more weeks in the cast.  She is using crutches.  PCP Clent Demark, PA-C   Past Medical History:  Diagnosis Date  . Abnormal EKG 2012   hospitalized for T wave inversion in lateral leads with MSK chest pains, normal ECHO, negative trops, no cardiology consult. recent EKG 7/15 stable T wave inversions.   . 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 Arthritis 2000  . Heart murmur   . Hyperlipidemia   . Hypertension 2010  . Meniscus tear    L KNEE    Patient Active Problem List   Diagnosis Date Noted  . 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 HLD (hyperlipidemia)   . Aortic atherosclerosis ([place])   . Right-sided low back pain without sciatica 07/08/2015  . 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 Bilateral leg pain 06/14/2015  . Seasonal allergies 03/09/2015  . Hypopigmentation 03/09/2015  . Gingivitis 11/10/2014  . Osteoarthritis of left knee 07/08/2014  . DJD (degenerative joint disease), lumbar 07/08/2014  . Osteoarthritis of right knee 07/08/2014    . Foot swelling 06/11/2014  . Pain due to onychomycosis of toenail 06/11/2014  . Borborygmi 06/11/2014  . Allergic rhinoconjunctivitis of both eyes 06/11/2014  . Nearsightedness 04/27/2014  . Abnormal EKG   . Hypertension 12/11/2013    Past Surgical History:  Procedure Laterality Date  . ABDOMINAL SURGERY  2010   for bowel blockage  . KNEE ARTHROSCOPY Left 09/25/2013   Procedure: LEFT KNEE ARTHROSCOPY WITH PARTIAL MEDIAL AND LATERAL  MENISCECTOMY/DEBRIDEMENT/MICRO FRACTURE MEDIAL FEMORAL CONDYLE, REMOVAL OF OSTEOCONDRAL FRAGMENT;  Surgeon: Johnn Hai, MD;  Location: [facility];  Service: Orthopedics;  Laterality: Left;  . KNEE SURGERY Right 1999  . TEE WITHOUT CARDIOVERSION N/A 10/04/2015   Procedure: TRANSESOPHAGEAL ECHOCARDIOGRAM (TEE);  Surgeon: Sueanne Margarita, MD;  Location: [facility] ENDOSCOPY;  Service: Cardiovascular;  Laterality: N/A;  . TUBAL LIGATION  1979    OB History    No data available       Home Medications    Prior to Admission medications   Medication Sig Start Date End Date Taking? Authorizing Provider  amLODipine (NORVASC) 10 MG tablet Take 1 tablet (10 mg total) by mouth daily. 03/12/17  Yes Clent Demark, PA-C  aspirin EC 81 MG tablet Take 1 tablet (81 mg total) daily by mouth. 01/31/17  Yes Clent Demark, PA-C  atorvastatin (LIPITOR) 40 MG tablet TAKE 1 TABLET(40 MG) BY  MOUTH DAILY AT 6 PM 10/12/16  Yes Funches, Josalyn, MD  carvedilol (COREG) 12.5 MG tablet TAKE 1 TABLET(12.5 MG) BY MOUTH TWICE DAILY WITH A MEAL 03/12/17  Yes Clent Demark, PA-C  hydrochlorothiazide (HYDRODIURIL) 25 MG tablet Take 1 tablet (25 mg total) by mouth daily. Take on tablet in the morning. 03/12/17  Yes Clent Demark, PA-C  Multiple Vitamin (MULITIVITAMIN WITH MINERALS) TABS Take 1 tablet by mouth daily.   Yes [provider]  oxyCODONE (OXY IR/ROXICODONE) 5 MG immediate release tablet Take 5 mg by mouth every 4 (four) hours as needed for moderate pain.   05/03/17  Yes [provider]  potassium chloride SA (K-DUR,KLOR-CON) 20 MEQ tablet TAKE 1 TABLET BY MOUTH DAILY 03/28/17  Yes Clent Demark, PA-C  cyclobenzaprine (FLEXERIL) 5 MG tablet Take 1 tablet (5 mg total) by mouth 3 (three) times daily as needed for muscle spasms. 05/07/17   Rolland Porter, MD  etodolac (LODINE) 200 MG capsule Take 1 capsule (200 mg total) by mouth 2 (two) times daily. Patient not taking: Reported on 03/24/2017 03/12/17   Clent Demark, PA-C  fluticasone [facility]) 50 MCG/ACT nasal spray Place 2 sprays into both nostrils daily. Patient not taking: Reported on 05/07/2017 07/11/16   Boykin Nearing, MD  loratadine (CLARITIN) 10 MG tablet Take 1 tablet (10 mg total) by mouth daily. Patient not taking: Reported on 03/24/2017 07/11/16   Boykin Nearing, MD  naproxen (NAPROSYN) 250 MG tablet Take 1 po BID with food prn pain 05/07/17   Rolland Porter, MD  oxyCODONE-acetaminophen (PERCOCET/ROXICET) 5-325 MG tablet Take 1 tablet by mouth every 4 (four) hours as needed for severe pain. Patient not taking: Reported on 05/07/2017 03/24/17   Pattricia Boss, MD  tiZANidine (ZANAFLEX) 4 MG tablet TAKE 1 TABLET(4 MG) BY MOUTH EVERY 8 HOURS AS NEEDED FOR MUSCLE SPASMS Patient not taking: Reported on 03/12/2017 09/26/16   Boykin Nearing, MD    Family History Family History  Problem Relation Age of Onset  . Hypertension Mother   . Heart disease Mother        has a pacemaker   . Heart failure Father   . Hypertension Father   . Cancer Neg Hx   . Colon cancer Neg Hx     Social History Social History   Tobacco Use  . Smoking status: Former Smoker    Last attempt to quit: 07/29/2010    Years since quitting: 6.7  . Smokeless tobacco: Never Used  Substance Use Topics  . Alcohol use: No    Alcohol/week: 0.0 oz  . Drug use: No  lives with her son   Allergies   Shrimp [shellfish allergy]; Latex; and Penicillins   Review of Systems Review of Systems  All other systems  reviewed and are negative.    Physical Exam Updated Vital Signs BP 126/66 (BP Location: Right Arm)   Pulse 64   Temp 97.9 F (36.6 C) (Oral)   Resp 18   SpO2 100%   Vital signs normal    Physical Exam  Constitutional: She is oriented to person, place, and time. She appears well-developed and well-nourished.  Non-toxic appearance. She does not appear ill. No distress.  HENT:  Head: Normocephalic and atraumatic.  Right Ear: External ear normal.  Left Ear: External ear normal.  Nose: Nose normal. No mucosal edema or rhinorrhea.  Mouth/Throat: Oropharynx is clear and moist and mucous membranes are normal. No dental abscesses or uvula swelling.  Eyes: Conjunctivae and EOM  are normal. Pupils are equal, round, and reactive to light.  Neck: Normal range of motion and full passive range of motion without pain. Neck supple.  Cardiovascular: Normal rate, regular rhythm and normal heart sounds. Exam reveals no gallop and no friction rub.  No murmur heard. Pulmonary/Chest: Effort normal and breath sounds normal. No respiratory distress. She has no wheezes. She has no rhonchi. She has no rales. She exhibits no tenderness and no crepitus.  Abdominal: Soft. Normal appearance and bowel sounds are normal. She exhibits no distension. There is no tenderness. There is no rebound and no guarding.    The area she states is painful is noted.  Her abdominal exam is very inconsistent, at one point she was tender in the left lower quadrant but when I went back she was not.  Musculoskeletal: Normal range of motion. She exhibits no edema or tenderness.  Moves all extremities well.  Patient has a short leg cast on her right lower leg.  Neurological: She is alert and oriented to person, place, and time. She has normal strength. No cranial nerve deficit.  Skin: Skin is warm, dry and intact. No rash noted. No erythema. No pallor.  Psychiatric: She has a normal mood and affect. Her speech is normal and behavior  is normal. Her mood appears not anxious.  Nursing note and vitals reviewed.    ED Treatments / Results  Labs (all labs ordered are listed, but only abnormal results are displayed)  Results for orders placed or performed during the hospital encounter of 05/07/17  Urinalysis, Routine w reflex microscopic  Result Value Ref Range   Color, Urine YELLOW YELLOW   APPearance CLEAR CLEAR   Specific Gravity, Urine 1.013 1.005 - 1.030   pH 5.0 5.0 - 8.0   Glucose, UA NEGATIVE NEGATIVE mg/dL   Hgb urine dipstick SMALL (A) NEGATIVE   Bilirubin Urine NEGATIVE NEGATIVE   Ketones, ur NEGATIVE NEGATIVE mg/dL   Protein, ur NEGATIVE NEGATIVE mg/dL   Nitrite NEGATIVE NEGATIVE   Leukocytes, UA NEGATIVE NEGATIVE   RBC / HPF 0-5 0 - 5 RBC/hpf   WBC, UA 0-5 0 - 5 WBC/hpf   Bacteria, UA NONE SEEN NONE SEEN   Squamous Epithelial / LPF 0-5 (A) NONE SEEN   Mucus PRESENT   Comprehensive metabolic panel  Result Value Ref Range   Sodium 142 135 - 145 mmol/L   Potassium 3.5 3.5 - 5.1 mmol/L   Chloride 103 101 - 111 mmol/L   CO2 24 22 - 32 mmol/L   Glucose, Bld 101 (H) 65 - 99 mg/dL   BUN 12 6 - 20 mg/dL   Creatinine, Ser 0.97 0.44 - 1.00 mg/dL   Calcium 9.4 8.9 - 10.3 mg/dL   Total Protein 7.1 6.5 - 8.1 g/dL   Albumin 3.6 3.5 - 5.0 g/dL   AST 19 15 - 41 U/L   ALT 17 14 - 54 U/L   Alkaline Phosphatase 112 38 - 126 U/L   Total Bilirubin 0.7 0.3 - 1.2 mg/dL   GFR calc non Af Amer 59 (L) >60 mL/min   GFR calc Af Amer >60 >60 mL/min   Anion gap 15 5 - 15  Lipase, blood  Result Value Ref Range   Lipase 19 11 - 51 U/L  CBC with Differential  Result Value Ref Range   WBC 9.4 4.0 - 10.5 K/uL   RBC 3.92 3.87 - 5.11 MIL/uL   Hemoglobin 11.9 (L) 12.0 - 15.0 g/dL   HCT 37.9  36.0 - 46.0 %   MCV 96.7 78.0 - 100.0 fL   MCH 30.4 26.0 - 34.0 pg   MCHC 31.4 30.0 - 36.0 g/dL   RDW 14.0 11.5 - 15.5 %   Platelets 288 150 - 400 K/uL   Neutrophils Relative % 68 %   Neutro Abs 6.4 1.7 - 7.7 K/uL   Lymphocytes  Relative 24 %   Lymphs Abs 2.3 0.7 - 4.0 K/uL   Monocytes Relative 5 %   Monocytes Absolute 0.4 0.1 - 1.0 K/uL   Eosinophils Relative 3 %   Eosinophils Absolute 0.3 0.0 - 0.7 K/uL   Basophils Relative 0 %   Basophils Absolute 0.0 0.0 - 0.1 K/uL   Laboratory interpretation all normal except minimal anemia   EKG  EKG Interpretation None       Radiology Ct Renal Stone Study  Result Date: 05/07/2017 CLINICAL DATA:  67 y/o F; left flank pain radiating to the lower abdomen. EXAM: CT ABDOMEN AND PELVIS WITHOUT CONTRAST TECHNIQUE: Multidetector CT imaging of the abdomen and pelvis was performed following the standard protocol without IV contrast. COMPARISON:  04/15/2016 CT abdomen and pelvis. FINDINGS: Lower chest: No acute abnormality. Hepatobiliary: No focal liver abnormality is seen. No gallstones, gallbladder wall thickening, or biliary dilatation. Pancreas: Unremarkable. No pancreatic ductal dilatation or surrounding inflammatory changes. Spleen: Normal in size without focal abnormality. Adrenals/Urinary Tract: Adrenal glands are unremarkable. Kidneys are normal, without renal calculi, focal lesion, or hydronephrosis. Bladder is unremarkable. Stomach/Bowel: Stomach is within normal limits. Appendix appears normal. No evidence of bowel wall thickening, distention, or inflammatory changes. Vascular/Lymphatic: Aortic atherosclerosis. No enlarged abdominal or pelvic lymph nodes. 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 No adnexal lesion identified. Other: Small inguinal hernias containing fat. Thin neck upper ventral abdominal wall hernia containing fat is increased in size (series 3, image 36). Musculoskeletal: Stable moderate lumbar spine dextrocurvature with apex at L2 and severe multilevel spondylosis with predominantly discogenic degenerative changes. Right greater than left facet hypertrophy. Endplate marginal osteophytes and facet hypertrophy results in right-sided neural  foraminal stenosis at the L4-5 and L5-S1 levels and left-sided stenosis at the L2-3 and L3-4 levels. Stable grade 1 L5-S1 anterolisthesis. No acute fracture identified. IMPRESSION: 1. No acute process identified. 2. Stable severe degenerative changes of the lumbar spine. 3. Thin neck upper ventral abdominal wall hernia containing fat is mildly increased in size. 4. Aortic atherosclerosis. 5. Stable calcified uterine myoma. Electronically Signed   By: Kristine Garbe M.D.   On: 05/07/2017 05:18    Procedures Procedures (including critical care time)  Medications Ordered in ED Medications  ketorolac (TORADOL) 15 MG/ML injection 15 mg (15 mg Intravenous Given 05/07/17 0550)     Initial Impression / Assessment and Plan / ED Course  I have reviewed the triage vital signs and the nursing notes.  Pertinent labs & imaging results that were available during my care of the patient were reviewed by me and considered in my medical decision making (see chart for details).    Restarted with urinalysis since patient did tell nursing staff she had this pain before with UTI.  When I review of patient's urinalysis she does not have an obvious UTI.  Therefore we proceeded with laboratory testing and a CT renal to look for renal stone which could cause left-sided abdomen and flank pain that she is describing.  IV was inserted and she was given IV Toradol for pain.  Recheck at 7:20 AM patient states her pain  is better.  For some reason her lipase and see me that have not resulted.  I have nursing staff call the lab and they were told they had misplaced her blood.  The nurse was getting ready to redraw her blood and at 7:30 they called back and stated they found the blood.  7:35 AM patient's missing blood work has resulted.  It is normal.  She will be treated for muscular skeletal pain.  She may have a pulled muscle from using the crutches for 6 weeks.  Final Clinical Impressions(s) / ED Diagnoses    Final diagnoses:  Abdominal wall pain in left flank    ED Discharge Orders        Ordered    naproxen (NAPROSYN) 250 MG tablet     05/07/17 0739    cyclobenzaprine (FLEXERIL) 5 MG tablet  3 times daily PRN     05/07/17 0739     Plan discharge  Rolland Porter, MD, Barbette Or, MD 05/07/17 [phone]

## 2017-05-07 NOTE — Discharge Instructions (Signed)
Use ice and heat for comfort. Take the medication as prescribed. You have a pulled muscle most likely from using your crutches. Recheck if you get a fever, vomiting or seem worse.

## 2017-05-07 NOTE — ED Triage Notes (Signed)
Pt transported from home by EMS with c/o L side pain radiating to lower abdomen. Pt denies urinary s/s to EMS but states similar pain in the past has been related to UTI

## 2017-05-07 NOTE — ED Notes (Signed)
Delay in lab draw,  Pt not in room 

## 2017-05-09 ENCOUNTER — Telehealth (INDEPENDENT_AMBULATORY_CARE_PROVIDER_SITE_OTHER): Payer: Self-pay | Admitting: Physician Assistant

## 2017-05-09 NOTE — Telephone Encounter (Signed)
Pt called to request a refill for her  potassium chloride SA (K-DUR,KLOR-CON) 20 MEQ tablet Please sent it to [facility] [place] [facility] [place] [address] Please follow up

## 2017-05-10 ENCOUNTER — Other Ambulatory Visit (INDEPENDENT_AMBULATORY_CARE_PROVIDER_SITE_OTHER): Payer: Self-pay | Admitting: Physician Assistant

## 2017-05-10 DIAGNOSIS — Z79899 Other long term (current) drug therapy: Secondary | ICD-10-CM

## 2017-05-10 MED ORDER — POTASSIUM CHLORIDE ER 10 MEQ PO TBCR
10.0000 meq | EXTENDED_RELEASE_TABLET | Freq: Every day | ORAL | 1 refills | Status: DC
Start: 2017-05-10 — End: 2017-08-08

## 2017-05-10 NOTE — Telephone Encounter (Signed)
I called Pt and informed her that her refill has been sent to the pharmacy

## 2017-05-10 NOTE — Telephone Encounter (Signed)
I will send out Potassium at 10 Meq to [facility] at [address]. Thank you.

## 2017-05-16 ENCOUNTER — Telehealth (INDEPENDENT_AMBULATORY_CARE_PROVIDER_SITE_OTHER): Payer: Self-pay | Admitting: Physician Assistant

## 2017-05-16 NOTE — Telephone Encounter (Signed)
Patient verified DOB Patient is aware of PCP sending an alternative if available to meet formulary needs for patients over 16.

## 2017-05-16 NOTE — Telephone Encounter (Signed)
Patient LVM saying that she has Muscle spasm  And please, call her  Thank you

## 2017-05-17 DIAGNOSIS — S82244D Nondisplaced spiral fracture of shaft of right tibia, subsequent encounter for closed fracture with routine healing: Secondary | ICD-10-CM | POA: Diagnosis not present

## 2017-05-18 ENCOUNTER — Telehealth (INDEPENDENT_AMBULATORY_CARE_PROVIDER_SITE_OTHER): Payer: Self-pay | Admitting: Physician Assistant

## 2017-05-18 NOTE — Telephone Encounter (Signed)
Patient [name] wants a return call to talk about her visit on 01-31-17 Thank you

## 2017-05-18 NOTE — Telephone Encounter (Signed)
I have not prescribed this medication to her. She must be seen for evaluation.

## 2017-05-22 NOTE — Telephone Encounter (Signed)
I called pt to informed her of the respond of the pcp, lvm to call us back to schedule app

## 2017-05-22 NOTE — Telephone Encounter (Signed)
Pt just call back and she said never mind and she does not want an appt

## 2017-05-28 NOTE — Telephone Encounter (Signed)
Fwd to PCP. Tempestt S Roberts, CMA  

## 2017-05-28 NOTE — Telephone Encounter (Signed)
Have patient return for potassium testing. I remember she said she does not want to return here anymore. Please find out what she wants.

## 2017-05-28 NOTE — Telephone Encounter (Signed)
Please refer to other note. See if she wants to continue care with [place]. She will need potassium testing.

## 2017-05-29 NOTE — Telephone Encounter (Signed)
Patient has already received Refill. Stated she had not been coming because she broke leg in two places. Waiting for ortho to release her. Nat Christen, CMA

## 2017-05-31 DIAGNOSIS — S82244G Nondisplaced spiral fracture of shaft of right tibia, subsequent encounter for closed fracture with delayed healing: Secondary | ICD-10-CM | POA: Diagnosis not present

## 2017-05-31 DIAGNOSIS — S82244D Nondisplaced spiral fracture of shaft of right tibia, subsequent encounter for closed fracture with routine healing: Secondary | ICD-10-CM | POA: Diagnosis not present

## 2017-06-11 ENCOUNTER — Encounter (HOSPITAL_COMMUNITY): Payer: Self-pay

## 2017-06-11 ENCOUNTER — Emergency Department (HOSPITAL_COMMUNITY)
Admission: EM | Admit: 2017-06-11 | Discharge: 2017-06-11 | Disposition: A | Discharge status: Home / Self Care | Payer: PPO | Attending: Emergency Medicine | Admitting: Emergency Medicine

## 2017-06-11 ENCOUNTER — Emergency Department (HOSPITAL_BASED_OUTPATIENT_CLINIC_OR_DEPARTMENT_OTHER)
Admit: 2017-06-11 | Discharge: 2017-06-11 | Disposition: A | Discharge status: Home / Self Care | Payer: PPO | Attending: Emergency Medicine | Admitting: Emergency Medicine

## 2017-06-11 DIAGNOSIS — M79609 Pain in unspecified limb: Secondary | ICD-10-CM | POA: Diagnosis not present

## 2017-06-11 DIAGNOSIS — Z79899 Other long term (current) drug therapy: Secondary | ICD-10-CM | POA: Insufficient documentation

## 2017-06-11 DIAGNOSIS — R52 Pain, unspecified: Secondary | ICD-10-CM | POA: Diagnosis not present

## 2017-06-11 DIAGNOSIS — Z87891 Personal history of nicotine dependence: Secondary | ICD-10-CM | POA: Diagnosis not present

## 2017-06-11 DIAGNOSIS — M5441 Lumbago with sciatica, right side: Secondary | ICD-10-CM | POA: Insufficient documentation

## 2017-06-11 DIAGNOSIS — M79604 Pain in right leg: Secondary | ICD-10-CM | POA: Diagnosis present

## 2017-06-11 DIAGNOSIS — M5431 Sciatica, right side: Secondary | ICD-10-CM

## 2017-06-11 DIAGNOSIS — M5489 Other dorsalgia: Secondary | ICD-10-CM | POA: Diagnosis not present

## 2017-06-11 DIAGNOSIS — Z91040 Latex allergy status: Secondary | ICD-10-CM | POA: Insufficient documentation

## 2017-06-11 DIAGNOSIS — M79661 Pain in right lower leg: Secondary | ICD-10-CM | POA: Diagnosis not present

## 2017-06-11 DIAGNOSIS — I10 Essential (primary) hypertension: Secondary | ICD-10-CM | POA: Insufficient documentation

## 2017-06-11 LAB — COMPREHENSIVE METABOLIC PANEL
ALT: 20 U/L (ref 14–54)
ANION GAP: 9 (ref 5–15)
AST: 23 U/L (ref 15–41)
Albumin: 4 g/dL (ref 3.5–5.0)
Alkaline Phosphatase: 124 U/L (ref 38–126)
BUN: 9 mg/dL (ref 6–20)
CALCIUM: 9.7 mg/dL (ref 8.9–10.3)
CHLORIDE: 107 mmol/L (ref 101–111)
CO2: 29 mmol/L (ref 22–32)
CREATININE: 0.79 mg/dL (ref 0.44–1.00)
Glucose, Bld: 96 mg/dL (ref 65–99)
Potassium: 3.6 mmol/L (ref 3.5–5.1)
SODIUM: 145 mmol/L (ref 135–145)
Total Bilirubin: 0.5 mg/dL (ref 0.3–1.2)
Total Protein: 8.1 g/dL (ref 6.5–8.1)

## 2017-06-11 LAB — CBC
HCT: 41.7 % (ref 36.0–46.0)
HEMOGLOBIN: 13.6 g/dL (ref 12.0–15.0)
MCH: 31.9 pg (ref 26.0–34.0)
MCHC: 32.6 g/dL (ref 30.0–36.0)
MCV: 97.7 fL (ref 78.0–100.0)
PLATELETS: 355 10*3/uL (ref 150–400)
RBC: 4.27 MIL/uL (ref 3.87–5.11)
RDW: 14.3 % (ref 11.5–15.5)
WBC: 10.5 10*3/uL (ref 4.0–10.5)

## 2017-06-11 LAB — URINALYSIS, ROUTINE W REFLEX MICROSCOPIC
Bilirubin Urine: NEGATIVE
Glucose, UA: NEGATIVE mg/dL
Hgb urine dipstick: NEGATIVE
Ketones, ur: NEGATIVE mg/dL
LEUKOCYTES UA: NEGATIVE
Nitrite: NEGATIVE
PROTEIN: NEGATIVE mg/dL
SPECIFIC GRAVITY, URINE: 1.015 (ref 1.005–1.030)
pH: 5 (ref 5.0–8.0)

## 2017-06-11 LAB — LIPASE, BLOOD: LIPASE: 30 U/L (ref 11–51)

## 2017-06-11 MED ORDER — IBUPROFEN 400 MG PO TABS: 400.0000 mg | ORAL_TABLET | Freq: Four times a day (QID) | ORAL | 0 refills | Status: DC | PRN

## 2017-06-11 NOTE — ED Notes (Signed)
Pt c/o right lower back pain that goes down to the right foot. Also, c/o abdominal discomfort and diarrhea. She recently got out of a cast on her right leg. She says she is supposed to be wearing a camwalker.

## 2017-06-11 NOTE — Progress Notes (Signed)
*  Preliminary Results* Right lower extremity venous duplex completed. Right lower extremity is negative for deep vein thrombosis. There is no evidence of right Baker's cyst.  06/11/2017 10:42 AM  Maudry Mayhew, BS, RVT, RDCS, RDMS

## 2017-06-11 NOTE — ED Provider Notes (Signed)
[place] DEPT Provider Note   CSN: [id number] Arrival date & time: 06/11/17  0710     History   Chief Complaint Chief Complaint  Patient presents with  . Back Pain  . Leg Pain  . Abdominal Pain    HPI [name] is a 67 y.o. female.  HPI  67 year old female with history of DVT, and a recent right lower extremity fracture comes in with chief complaint of right leg pain.  Patient states that she has had some pain starting at about the level of hip radiating down to her toes 3 days ago.  Pain is constant, there is no specific aggravating or relieving factors.  Patient denies any associated numbness, tingling.  Patient denies any midline back pain.  Review of system is negative for chest pain or chest shortness of. Pt has no associated numbness, weakness, urinary incontinence, urinary retention, bowel incontinence, pins and needle sensation in the perineal area.  Review of system is also positive for loose bowel movements into 2 days without any significant abdominal pain.  Patient denies any bloody stools or fevers.   Past Medical History:  Diagnosis Date  . Abnormal EKG 2012   hospitalized for T wave inversion in lateral leads with MSK chest pains, normal ECHO, negative trops, no cardiology consult. recent EKG 7/15 stable T wave inversions.   . 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 Arthritis 2000  . Heart murmur   . Hyperlipidemia   . Hypertension 2010  . Meniscus tear    L KNEE    Patient Active Problem List   Diagnosis Date Noted  . 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 HLD (hyperlipidemia)   . Aortic atherosclerosis ([place])   . Right-sided low back pain without sciatica 07/08/2015  . 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 Bilateral leg pain 06/14/2015  . Seasonal allergies 03/09/2015  . Hypopigmentation 03/09/2015  . Gingivitis 11/10/2014  . Osteoarthritis of left knee  07/08/2014  . DJD (degenerative joint disease), lumbar 07/08/2014  . Osteoarthritis of right knee 07/08/2014  . Foot swelling 06/11/2014  . Pain due to onychomycosis of toenail 06/11/2014  . Borborygmi 06/11/2014  . Allergic rhinoconjunctivitis of both eyes 06/11/2014  . Nearsightedness 04/27/2014  . Abnormal EKG   . Hypertension 12/11/2013    Past Surgical History:  Procedure Laterality Date  . ABDOMINAL SURGERY  2010   for bowel blockage  . KNEE ARTHROSCOPY Left 09/25/2013   Procedure: LEFT KNEE ARTHROSCOPY WITH PARTIAL MEDIAL AND LATERAL  MENISCECTOMY/DEBRIDEMENT/MICRO FRACTURE MEDIAL FEMORAL CONDYLE, REMOVAL OF OSTEOCONDRAL FRAGMENT;  Surgeon: Johnn Hai, MD;  Location: [facility];  Service: Orthopedics;  Laterality: Left;  . KNEE SURGERY Right 1999  . TEE WITHOUT CARDIOVERSION N/A 10/04/2015   Procedure: TRANSESOPHAGEAL ECHOCARDIOGRAM (TEE);  Surgeon: Sueanne Margarita, MD;  Location: [facility] ENDOSCOPY;  Service: Cardiovascular;  Laterality: N/A;  . TUBAL LIGATION  1979    OB History    No data available       Home Medications    Prior to Admission medications   Medication Sig Start Date End Date Taking? Authorizing Provider  amLODipine (NORVASC) 10 MG tablet Take 1 tablet (10 mg total) by mouth daily. 03/12/17   Clent Demark, PA-C  aspirin EC 81 MG tablet Take 1 tablet (81 mg total) daily by mouth. 01/31/17   Clent Demark, PA-C  atorvastatin (LIPITOR) 40 MG tablet TAKE 1 TABLET(40 MG) BY MOUTH DAILY  AT 6 PM 10/12/16   Funches, Adriana Mccallum, MD  carvedilol (COREG) 12.5 MG tablet TAKE 1 TABLET(12.5 MG) BY MOUTH TWICE DAILY WITH A MEAL 03/12/17   Clent Demark, PA-C  cyclobenzaprine (FLEXERIL) 5 MG tablet Take 1 tablet (5 mg total) by mouth 3 (three) times daily as needed for muscle spasms. 05/07/17   Ward, Ozella Almond, PA-C  etodolac (LODINE) 200 MG capsule Take 1 capsule (200 mg total) by mouth 2 (two) times daily. Patient not taking: Reported on 03/24/2017 03/12/17    Clent Demark, PA-C  fluticasone [facility]) 50 MCG/ACT nasal spray Place 2 sprays into both nostrils daily. Patient not taking: Reported on 05/07/2017 07/11/16   Boykin Nearing, MD  hydrochlorothiazide (HYDRODIURIL) 25 MG tablet Take 1 tablet (25 mg total) by mouth daily. Take on tablet in the morning. 03/12/17   Clent Demark, PA-C  loratadine (CLARITIN) 10 MG tablet Take 1 tablet (10 mg total) by mouth daily. Patient not taking: Reported on 03/24/2017 07/11/16   Boykin Nearing, MD  Multiple Vitamin (MULITIVITAMIN WITH MINERALS) TABS Take 1 tablet by mouth daily.    [provider]  naproxen (NAPROSYN) 250 MG tablet Take 1 po BID with food prn pain 05/07/17   Ward, Ozella Almond, PA-C  oxyCODONE (OXY IR/ROXICODONE) 5 MG immediate release tablet Take 5 mg by mouth every 4 (four) hours as needed for moderate pain.  05/03/17   [provider]  oxyCODONE-acetaminophen (PERCOCET/ROXICET) 5-325 MG tablet Take 1 tablet by mouth every 4 (four) hours as needed for severe pain. Patient not taking: Reported on 05/07/2017 03/24/17   Pattricia Boss, MD  potassium chloride (K-DUR) 10 MEQ tablet Take 1 tablet (10 mEq total) by mouth daily. 05/10/17   Clent Demark, PA-C  potassium chloride SA (K-DUR,KLOR-CON) 20 MEQ tablet TAKE 1 TABLET BY MOUTH DAILY 03/28/17   Clent Demark, PA-C  tiZANidine (ZANAFLEX) 4 MG tablet TAKE 1 TABLET(4 MG) BY MOUTH EVERY 8 HOURS AS NEEDED FOR MUSCLE SPASMS Patient not taking: Reported on 03/12/2017 09/26/16   Boykin Nearing, MD    Family History Family History  Problem Relation Age of Onset  . Hypertension Mother   . Heart disease Mother        has a pacemaker   . Heart failure Father   . Hypertension Father   . Cancer Neg Hx   . Colon cancer Neg Hx     Social History Social History   Tobacco Use  . Smoking status: Former Smoker    Last attempt to quit: 07/29/2010    Years since quitting: 6.8  . Smokeless tobacco: Never Used  Substance  Use Topics  . Alcohol use: No    Alcohol/week: 0.0 oz  . Drug use: No     Allergies   Shrimp [shellfish allergy]; Latex; and Penicillins   Review of Systems Review of Systems  Constitutional: Positive for activity change.  Respiratory: Negative for shortness of breath.   Cardiovascular: Negative for chest pain.  Musculoskeletal: Positive for myalgias.  Allergic/Immunologic: Negative for immunocompromised state.  Neurological: Negative for dizziness and numbness.  Hematological: Does not bruise/bleed easily.     Physical Exam Updated Vital Signs BP 125/69 (BP Location: Right Arm)   Pulse 61   Temp 97.7 F (36.5 C) (Oral)   Resp 18   Wt 72.6 kg (160 lb)   SpO2 100%   BMI 26.63 kg/m   Physical Exam  Constitutional: She is oriented to person, place, and time. She appears well-developed.  HENT:  Head: Normocephalic and atraumatic.  Eyes: EOM are normal.  Neck: Normal range of motion. Neck supple.  Cardiovascular: Normal rate.  Pulmonary/Chest: Effort normal.  Abdominal: Bowel sounds are normal. She exhibits no pulsatile midline mass. There is no tenderness.  Neurological: She is alert and oriented to person, place, and time.  Skin: Skin is warm and dry.  Right lower extremity unilateral swelling without calf tenderness.  Pt has tenderness over the lumbar region No step offs, no erythema. Pt has 2+ patellar reflex bilaterally. Able to discriminate between sharp and dull. Able to ambulate   Negative straight leg raise.  Nursing note and vitals reviewed.    ED Treatments / Results  Labs (all labs ordered are listed, but only abnormal results are displayed) Labs Reviewed  LIPASE, BLOOD  COMPREHENSIVE METABOLIC PANEL  CBC  URINALYSIS, ROUTINE W REFLEX MICROSCOPIC    EKG  EKG Interpretation None       Radiology No results found.  Procedures Procedures (including critical care time)  Medications Ordered in ED Medications - No data to  display   Initial Impression / Assessment and Plan / ED Course  I have reviewed the triage vital signs and the nursing notes.  Pertinent labs & imaging results that were available during my care of the patient were reviewed by me and considered in my medical decision making (see chart for details).  Clinical Course as of Jun 12 1102  Mon Jun 11, 2017  1104 Ultrasound is negative for acute DVT. Results from the ER workup discussed with the patient face to face and all questions answered to the best of my ability.   [AN]    Clinical Course User Index [AN] Varney Biles, MD    67 year old female comes in with chief complaint of right lower extremity pain.  Patient had a recent fracture to the right leg, requiring cast for close to 3 months.  Patient also has history of DVT.  Patient's right lower extremity is slightly swollen.  DVT study has been ordered.  I examined patient's back, she does not have any midline L-spine tenderness, and she does not have any red flags to suggest cord compression.  Patient is also having nonspecific abdominal pain, with loose bowel movements and to to.  Patient's abdominal exam is non-peritoneal.  Final Clinical Impressions(s) / ED Diagnoses   Final diagnoses:  None    ED Discharge Orders    None       Varney Biles, MD 06/11/17 1105

## 2017-06-11 NOTE — Discharge Instructions (Signed)
We suspect that your pain is related to sciatica. Please read the instructions provided.  Take the medicines prescribed. See her primary care doctor in 1 week.

## 2017-06-11 NOTE — ED Triage Notes (Addendum)
Per EMS, pt, from home, c/o R lower back pain shooting down R leg x 3 days and bilateral elbow pain, cough, abdominal and diarrhea x 2 days.  Pain score 9/10.  EMS reports previous L knee surgery and recently R lower leg fracture.  Pt took muscle relaxer w/ some relief.  Pt sts "the pain is going clear down between my toes."

## 2017-06-25 DIAGNOSIS — S82244G Nondisplaced spiral fracture of shaft of right tibia, subsequent encounter for closed fracture with delayed healing: Secondary | ICD-10-CM | POA: Diagnosis not present

## 2017-06-27 DIAGNOSIS — H524 Presbyopia: Secondary | ICD-10-CM | POA: Diagnosis not present

## 2017-06-27 DIAGNOSIS — H52223 Regular astigmatism, bilateral: Secondary | ICD-10-CM | POA: Diagnosis not present

## 2017-06-27 DIAGNOSIS — H5203 Hypermetropia, bilateral: Secondary | ICD-10-CM | POA: Diagnosis not present

## 2017-07-03 ENCOUNTER — Encounter: Payer: Self-pay | Admitting: Physical Therapy

## 2017-07-03 ENCOUNTER — Ambulatory Visit: Payer: PPO | Attending: Physician Assistant | Admitting: Physical Therapy

## 2017-07-03 DIAGNOSIS — G8929 Other chronic pain: Secondary | ICD-10-CM

## 2017-07-03 DIAGNOSIS — M25571 Pain in right ankle and joints of right foot: Secondary | ICD-10-CM | POA: Insufficient documentation

## 2017-07-03 DIAGNOSIS — M25561 Pain in right knee: Secondary | ICD-10-CM | POA: Diagnosis not present

## 2017-07-03 DIAGNOSIS — R262 Difficulty in walking, not elsewhere classified: Secondary | ICD-10-CM | POA: Insufficient documentation

## 2017-07-03 DIAGNOSIS — M6281 Muscle weakness (generalized): Secondary | ICD-10-CM | POA: Diagnosis not present

## 2017-07-03 NOTE — Therapy (Addendum)
[place], [place], [address] Phone: [phone]   Fax:  [phone]  Physical Therapy Evaluation  Patient Details  Name: [name] MRN: [id number] Date of Birth: 1950/10/09 Referring Provider: currence, Maia Breslow PA Clent Demark   Encounter Date: 07/03/2017  PT End of Session - 07/03/17 1845    Visit Number  1    Number of Visits  16    Date for PT Re-Evaluation  08/28/17    Authorization Type  Medicare     PT Start Time  1330    PT Stop Time  1414    PT Time Calculation (min)  44 min    Activity Tolerance  Patient tolerated treatment well    Behavior During Therapy  [facility] for tasks assessed/performed       Past Medical History:  Diagnosis Date  . Abnormal EKG 2012   hospitalized for T wave inversion in lateral leads with MSK chest pains, normal ECHO, negative trops, no cardiology consult. recent EKG 7/15 stable T wave inversions.   . 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 Arthritis 2000  . Heart murmur   . Hyperlipidemia   . Hypertension 2010  . Meniscus tear    L KNEE    Past Surgical History:  Procedure Laterality Date  . ABDOMINAL SURGERY  2010   for bowel blockage  . KNEE ARTHROSCOPY Left 09/25/2013   Procedure: LEFT KNEE ARTHROSCOPY WITH PARTIAL MEDIAL AND LATERAL  MENISCECTOMY/DEBRIDEMENT/MICRO FRACTURE MEDIAL FEMORAL CONDYLE, REMOVAL OF OSTEOCONDRAL FRAGMENT;  Surgeon: Johnn Hai, MD;  Location: [facility];  Service: Orthopedics;  Laterality: Left;  . KNEE SURGERY Right 1999  . TEE WITHOUT CARDIOVERSION N/A 10/04/2015   Procedure: TRANSESOPHAGEAL ECHOCARDIOGRAM (TEE);  Surgeon: Sueanne Margarita, MD;  Location: [facility] ENDOSCOPY;  Service: Cardiovascular;  Laterality: N/A;  . TUBAL LIGATION  1979    There were no vitals filed for this visit.   Subjective Assessment - 07/03/17 1336    Subjective  I slipped when I was making coffee and slipped in my flip flops and broke my leg.   Dr. Ronnie Derby said I fractured my knee and I have been wearing a boot for 3 weeks since my cast was taken off    Limitations  Standing;Walking    How long can you sit comfortably?  1 hour    How long can you stand comfortably?  10 minutes    How long can you walk comfortably?  10 min    Diagnostic tests  x ray    Patient Stated Goals  get strength back, walk without a cane, walk up and down steps     Currently in Pain?  Yes    Pain Score  8  when seated and no movement 1/10 8.10 at worst    Pain Location  Knee    Pain Orientation  Right    Pain Descriptors / Indicators  Throbbing    Pain Type  Chronic pain    Pain Onset  More than a month ago    Pain Frequency  Intermittent    Aggravating Factors   negotiate steps, cant do chores now, vacuuming, making my bed.    Multiple Pain Sites  Yes    Pain Score  5    Pain Location  Ankle    Pain Orientation  Right    Pain Descriptors / Indicators  Aching;Throbbing    Pain Type  Chronic pain    Pain Onset  More  than a month ago    Pain Frequency  Intermittent    Aggravating Factors   lying down spams at night, difficulty walking          [facility] PT Assessment - 07/03/17 1336      Assessment   Medical Diagnosis  Right tiba fx, spiral    Referring Provider  currence, Maia Breslow PA Clent Demark    Onset Date/Surgical Date  03/24/17    Hand Dominance  Right    Prior Therapy  [facility]PT about 4      Precautions   Precautions  Knee    Precaution Comments  WBAT    Required Braces or Orthoses  -- CAM boot for walking      Restrictions   Weight Bearing Restrictions  Yes    RLE Weight Bearing  Weight bearing as tolerated uses a cane      Balance Screen   Has the patient fallen in the past 6 months  Yes    How many times?  1 slipped on water wearing flip flops    Has the patient had a decrease in activity level because of a fear of falling?   No    Is the patient reluctant to leave their home because of a fear of falling?   No      Home  Film/video editor residence    Freight forwarder;Children    Type of Home  House    Home Access  Stairs to enter    Entrance Stairs-Number of Steps  10    Entrance Stairs-Rails  Can reach both    Home Layout  Two level      Prior Function   Level of Independence  Independent with household mobility with device      Cognition   Overall Cognitive Status  Within Functional Limits for tasks assessed      Observation/Other Assessments   Focus on Therapeutic Outcomes (FOTO)   FOTO intake 32, predicted 49% limitaiton is 68%      Observation/Other Assessments-Edema    Edema  Figure 8      Figure 8 Edema   Figure 8 - Right   53.5 cm    Figure 8 - Left   50 cm      Functional Tests   Functional tests  Single leg stance right 0 sec left 25 sec      Single Leg Stance   Comments  unable to stand on right leg alone      Posture/Postural Control   Posture/Postural Control  Postural limitations    Postural Limitations  Anterior pelvic tilt    Posture Comments  valgus knees bil      ROM / Strength   AROM / PROM / Strength  AROM;Strength      AROM   Right Knee Extension  4    Right Knee Flexion  120 PROM 128    Left Knee Extension  0    Left Knee Flexion  125 PROM 133    Right Ankle Dorsiflexion  0    Right Ankle Plantar Flexion  40    Right Ankle Inversion  18    Right Ankle Eversion  9    Left Ankle Dorsiflexion  8    Left Ankle Plantar Flexion  54    Left Ankle Inversion  36    Left Ankle Eversion  23      Strength   Overall Strength  Deficits    Right Hip Flexion  4/5    Right Hip Extension  4/5    Right Hip ABduction  4-/5    Left Hip Flexion  5/5    Left Hip Extension  4+/5    Left Hip ABduction  4/5    Right Knee Flexion  4-/5    Right Knee Extension  4-/5    Left Knee Flexion  5/5    Left Knee Extension  5/5    Right Ankle Dorsiflexion  4-/5    Right Ankle Plantar Flexion  3+/5    Left Ankle Dorsiflexion  4+/5    Left Ankle  Plantar Flexion  4+/5      Palpation   Palpation comment  tenderness over bil malleolus and over Right knee      Ambulation/Gait   Stairs  Yes    Stairs Assistance  6: Modified independent (Device/Increase time)    Stair Management Technique  Step to pattern;One rail Right cane    Number of Stairs  4                Objective measurements completed on examination: See above findings.      [facility] Adult PT Treatment/Exercise - 07/03/17 1336      Knee/Hip Exercises: Seated   Other Seated Knee/Hip Exercises  red t band resisited flex ext x 15 each with boot on       Ankle Exercises: Stretches   Gastroc Stretch  30 seconds;3 reps with towel    Other Stretch  AROM of ankle in all planes and sitting ev/IN,                PT Short Term Goals - 07/03/17 1850      PT SHORT TERM GOAL #1   Title  "Independent with initial HEP    Time  3    Period  Weeks    Status  New    Target Date  07/24/17      PT SHORT TERM GOAL #2   Title  "Pt will ambulate with LRAD and without limp    Time  3    Period  Weeks    Status  New    Target Date  07/24/17      PT SHORT TERM GOAL #3   Title  Pt will gain right ankle dorsiflexion at least 5 degrees    Baseline  0 at eval    Time  3    Period  Weeks    Status  New    Target Date  07/24/17        PT Long Term Goals - 07/03/17 1851      PT LONG TERM GOAL #1   Title  "Pt will be independent with advanced HEP.     Time  8    Period  Weeks    Status  New    Target Date  08/28/17      PT LONG TERM GOAL #2   Title  "FOTO will improve from   68% limitation  to  49% limtation   indicating improved functional mobility     Time  8    Period  Weeks    Status  New    Target Date  08/28/17      PT LONG TERM GOAL #3   Title  "Pt will improve her R knee flexion to  >/= 130degrees and extension to </= 2 degrees with </= 2/10 pain for a  more functional and efficient gait pattern    Time  8    Period  Weeks    Status  New     Target Date  08/28/17      PT LONG TERM GOAL #4   Title  Pt will be able to negotiate steps with step over  step techinique and minimal use of hands in order to climb 10 steps in home with 75% greater ease    Baseline  Pt steps one at a time and must use UE for support    Time  8    Period  Weeks    Status  New    Target Date  08/28/17      PT LONG TERM GOAL #5   Title  Pt will be able to wear normal tennis shoes and be able to vacuum house without exacerbating pain in right LE greater than 2/10    Baseline  Pt in CAM boot on evaluation and significant swelling of R ankle    Time  8    Period  Weeks    Status  New    Target Date  08/28/17             Plan - 07/03/17 1840    Clinical Impression Statement  Ms. Radebaugh suffered Closed nondisplaced spiral fracture of shaft of right tibia caused by fall /slip on water while wearing flipflops on 03-24-18. She enters clinic wearing a CAM walking boot and WBAT on RIght.Pt has difficulty walking and negotiating steps.  She is unable to perform household chores and cannot stand longer than 10 minutes  Pt utilizes a cane that is too long for her. She will bring another cane next visit.  Pt has impairments in strength, pain, AROM of right knee and ankle. and edema    History and Personal Factors relevant to plan of care:  Old left knee meniscus.  See medical chart    Clinical Presentation  Stable    Clinical Decision Making  Low    Rehab Potential  Good    PT Frequency  2x / week    PT Duration  8 weeks    PT Treatment/Interventions  ADLs/Self Care Home Management;Cryotherapy;Electrical Stimulation;Iontophoresis 4mg /ml Dexamethasone;Moist Heat;Ultrasound;Taping;Dry needling;Passive range of motion;Patient/family education;Neuromuscular re-education;Functional mobility training;Stair training;Gait training;Therapeutic activities;Therapeutic exercise    PT Next Visit Plan  Computer went down.  handouts given for exercises. issue HEP and gait  training.  Pt to see MD to check on weaning from [place]  ankle AROM, resisted knee flex/ext with red t band    Consulted and Agree with Plan of Care  Patient       Patient will benefit from skilled therapeutic intervention in order to improve the following deficits and impairments:  Difficulty walking, Pain, Increased muscle spasms, Decreased strength, Decreased range of motion, Decreased mobility, Postural dysfunction, Improper body mechanics  Visit Diagnosis: Chronic pain of right knee  Pain in right ankle and joints of right foot  Difficulty in walking, not elsewhere classified  Muscle weakness (generalized)     Problem List Patient Active Problem List   Diagnosis Date Noted  . 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 HLD (hyperlipidemia)   . Aortic atherosclerosis ([place])   . Right-sided low back pain without sciatica 07/08/2015  . 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 Bilateral leg pain 06/14/2015  . Seasonal allergies 03/09/2015  .  Hypopigmentation 03/09/2015  . Gingivitis 11/10/2014  . Osteoarthritis of left knee 07/08/2014  . DJD (degenerative joint disease), lumbar 07/08/2014  . Osteoarthritis of right knee 07/08/2014  . Foot swelling 06/11/2014  . Pain due to onychomycosis of toenail 06/11/2014  . Borborygmi 06/11/2014  . Allergic rhinoconjunctivitis of both eyes 06/11/2014  . Nearsightedness 04/27/2014  . Abnormal EKG   . Hypertension 12/11/2013    Voncille Lo, PT Certified Exercise Expert for the Aging Adult  07/03/17 7:50 PM Phone: [phone] Fax: [place] [facility] [address] [place], [place], [address] Phone: [phone]   Fax:  [phone]  Name: Lewanda Perea MRN: [id number] Date of Birth: [birth year]

## 2017-07-05 NOTE — Addendum Note (Signed)
Addended by: Dorothea Ogle on: 07/05/2017 10:10 AM   Modules accepted: Orders

## 2017-07-20 ENCOUNTER — Encounter: Payer: Self-pay | Admitting: Physical Therapy

## 2017-07-20 ENCOUNTER — Ambulatory Visit: Payer: PPO | Admitting: Physical Therapy

## 2017-07-20 ENCOUNTER — Encounter

## 2017-07-20 DIAGNOSIS — R262 Difficulty in walking, not elsewhere classified: Secondary | ICD-10-CM

## 2017-07-20 DIAGNOSIS — M25561 Pain in right knee: Principal | ICD-10-CM

## 2017-07-20 DIAGNOSIS — M25571 Pain in right ankle and joints of right foot: Secondary | ICD-10-CM

## 2017-07-20 DIAGNOSIS — G8929 Other chronic pain: Secondary | ICD-10-CM

## 2017-07-20 DIAGNOSIS — M6281 Muscle weakness (generalized): Secondary | ICD-10-CM

## 2017-07-20 NOTE — Therapy (Signed)
[place], [place], [address] Phone: [phone]   Fax:  [phone]  Physical Therapy Treatment  Patient Details  Name: [name] MRN: [id number] Date of Birth: 1951-01-21 Referring Provider: currence, Maia Breslow PA Clent Demark   Encounter Date: 07/20/2017  PT End of Session - 07/20/17 1056    Visit Number  2    Number of Visits  16    Date for PT Re-Evaluation  08/28/17    Authorization Type  Medicare     PT Start Time  1058    PT Stop Time  1145    PT Time Calculation (min)  47 min    Activity Tolerance  Patient tolerated treatment well    Behavior During Therapy  [facility] for tasks assessed/performed       Past Medical History:  Diagnosis Date  . Abnormal EKG 2012   hospitalized for T wave inversion in lateral leads with MSK chest pains, normal ECHO, negative trops, no cardiology consult. recent EKG 7/15 stable T wave inversions.   . 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 Arthritis 2000  . Heart murmur   . Hyperlipidemia   . Hypertension 2010  . Meniscus tear    L KNEE    Past Surgical History:  Procedure Laterality Date  . ABDOMINAL SURGERY  2010   for bowel blockage  . KNEE ARTHROSCOPY Left 09/25/2013   Procedure: LEFT KNEE ARTHROSCOPY WITH PARTIAL MEDIAL AND LATERAL  MENISCECTOMY/DEBRIDEMENT/MICRO FRACTURE MEDIAL FEMORAL CONDYLE, REMOVAL OF OSTEOCONDRAL FRAGMENT;  Surgeon: Johnn Hai, MD;  Location: [facility];  Service: Orthopedics;  Laterality: Left;  . KNEE SURGERY Right 1999  . TEE WITHOUT CARDIOVERSION N/A 10/04/2015   Procedure: TRANSESOPHAGEAL ECHOCARDIOGRAM (TEE);  Surgeon: Sueanne Margarita, MD;  Location: [facility] ENDOSCOPY;  Service: Cardiovascular;  Laterality: N/A;  . TUBAL LIGATION  1979    There were no vitals filed for this visit.  Subjective Assessment - 07/20/17 1103    Subjective  I am doing better now  I am a 4/10. I feel like I am walking better,  sometimes I have  pain at night    Limitations  Standing;Walking    How long can you sit comfortably?  1 hour    How long can you stand comfortably?  10 minutes    How long can you walk comfortably?  10 min    Diagnostic tests  x ray    Patient Stated Goals  get strength back, walk without a cane, walk up and down steps     Currently in Pain?  Yes    Pain Score  4     Pain Location  Knee    Pain Orientation  Right    Pain Descriptors / Indicators  Sore    Pain Type  Chronic pain    Pain Onset  More than a month ago    Pain Frequency  Intermittent    Pain Score  6    Pain Location  Ankle    Pain Orientation  Right    Pain Descriptors / Indicators  Aching    Pain Type  Chronic pain    Pain Onset  More than a month ago    Pain Frequency  Intermittent                       [facility] Adult PT Treatment/Exercise - 07/20/17 1100      Ambulation/Gait   Ambulation/Gait  Yes  Ambulation/Gait Assistance  6: Modified independent (Device/Increase time)    Ambulation Distance (Feet)  200 Feet    Assistive device  Straight cane    Gait Pattern  Step-to pattern;Wide base of support;Decreased weight shift to right    Ambulation Surface  Level    Stairs  Yes    Stairs Assistance  6: Modified independent (Device/Increase time)    Stair Management Technique  One rail Right;Alternating pattern cane    Number of Stairs  8    Gait Comments  Pt depends on UE support in alternating pattern . also has slip on shoes with increased toe gripping.  told pt she needs tennis shoes for safer ambulation      Knee/Hip Exercises: Seated   Other Seated Knee/Hip Exercises  red t band resisited flex ext x 15 each       Knee/Hip Exercises: Supine   Quad Sets  10 reps;Left    Quad Sets Limitations  x 2    Straight Leg Raises  10 reps;Left;Strengthening    Straight Leg Raises Limitations  x3    Other Supine Knee/Hip Exercises  sit to stand with chair 10 x 2   pt fatigues      Manual Therapy   Manual Therapy   Joint mobilization    Joint Mobilization  left joint mobs fo talocrural and sub talar joint grade 3      Ankle Exercises: Standing   Heel Raises  Both;15 reps;3 seconds    Heel Raises Limitations  x 2 with counter hold    Other Standing Ankle Exercises  step up forward c UE support 2 x 10      Ankle Exercises: Stretches   Gastroc Stretch  30 seconds;2 reps left standing    Other Stretch  towel DF stretch 2 x 30 sec hold             PT Education - 07/20/17 1119    Education provided  Yes  (Pended)     Education Details  added to HEP and corrected exercises that pt was not doing well from intial evaluation   (Pended)     Person(s) Educated  Patient  (Pended)     Methods  Explanation;Demonstration;Tactile cues;Verbal cues  (Pended)     Comprehension  Verbalized understanding;Returned demonstration  (Pended)        PT Short Term Goals - 07/20/17 1206      PT SHORT TERM GOAL #1   Title  "Independent with initial HEP    Baseline  Given initial HEP    Period  Weeks    Status  On-going      PT SHORT TERM GOAL #2   Title  "Pt will ambulate with LRAD and without limp    Baseline  Pt with wide based antalgic gait with cane    Time  3    Period  Weeks    Status  On-going      PT SHORT TERM GOAL #3   Title  Pt will gain right ankle dorsiflexion at least 5 degrees    Baseline  0 at eval    Time  3    Period  Weeks    Status  On-going        PT Long Term Goals - 07/03/17 1851      PT LONG TERM GOAL #1   Title  "Pt will be independent with advanced HEP.     Time  8    Period  Weeks  Status  New    Target Date  08/28/17      PT LONG TERM GOAL #2   Title  "FOTO will improve from   68% limitation  to  49% limtation   indicating improved functional mobility     Time  8    Period  Weeks    Status  New    Target Date  08/28/17      PT LONG TERM GOAL #3   Title  "Pt will improve her R knee flexion to  >/= 130degrees and extension to </= 2 degrees with </= 2/10 pain  for a more functional and efficient gait pattern    Time  8    Period  Weeks    Status  New    Target Date  08/28/17      PT LONG TERM GOAL #4   Title  Pt will be able to negotiate steps with step over  step techinique and minimal use of hands in order to climb 10 steps in home with 75% greater ease    Baseline  Pt steps one at a time and must use UE for support    Time  8    Period  Weeks    Status  New    Target Date  08/28/17      PT LONG TERM GOAL #5   Title  Pt will be able to wear normal tennis shoes and be able to vacuum house without exacerbating pain in right LE greater than 2/10    Baseline  Pt in CAM boot on evaluation and significant swelling of R ankle    Time  8    Period  Weeks    Status  New    Target Date  08/28/17            Plan - 07/20/17 1202    Clinical Impression Statement  Pt enters clinic with antalgic gait with wide based support and cane.  Pt with 6/10 left ankle pain and 4/10 left knee pain. Pt is wearing slip on shoes and told to use tennis shoes for safe gait.  Pt was not correctly performing resisted knee ext and flexion with red t band and was corrected. Pt needs reinforcement/ extra time for proper execution of exericseis    Rehab Potential  Good    PT Frequency  2x / week    PT Duration  8 weeks    PT Treatment/Interventions  ADLs/Self Care Home Management;Cryotherapy;Electrical Stimulation;Iontophoresis 4mg /ml Dexamethasone;Moist Heat;Ultrasound;Taping;Dry needling;Passive range of motion;Patient/family education;Neuromuscular re-education;Functional mobility training;Stair training;Gait training;Therapeutic activities;Therapeutic exercise    PT Next Visit Plan  Review exercises and sequence. Pt needs a shorter cane and will bring and wear tennis shoes    PT Home Exercise Plan  ankle AROM, resisted knee flex/ext with red t band sit to stand, gastroc stretch heel raises,, SLR and Quad set and forward knee step ups    Consulted and Agree with Plan  of Care  Patient       Patient will benefit from skilled therapeutic intervention in order to improve the following deficits and impairments:  Difficulty walking, Pain, Increased muscle spasms, Decreased strength, Decreased range of motion, Decreased mobility, Postural dysfunction, Improper body mechanics  Visit Diagnosis: Chronic pain of right knee  Pain in right ankle and joints of right foot  Difficulty in walking, not elsewhere classified  Muscle weakness (generalized)     Problem List Patient Active Problem List   Diagnosis Date Noted  .  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 HLD (hyperlipidemia)   . Aortic atherosclerosis ([place])   . Right-sided low back pain without sciatica 07/08/2015  . 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 Bilateral leg pain 06/14/2015  . Seasonal allergies 03/09/2015  . Hypopigmentation 03/09/2015  . Gingivitis 11/10/2014  . Osteoarthritis of left knee 07/08/2014  . DJD (degenerative joint disease), lumbar 07/08/2014  . Osteoarthritis of right knee 07/08/2014  . Foot swelling 06/11/2014  . Pain due to onychomycosis of toenail 06/11/2014  . Borborygmi 06/11/2014  . Allergic rhinoconjunctivitis of both eyes 06/11/2014  . Nearsightedness 04/27/2014  . Abnormal EKG   . Hypertension 12/11/2013   Voncille Lo, PT Certified Exercise Expert for the Aging Adult  07/20/17 12:12 PM Phone: [phone] Fax: [place] [facility] [address] [place], [place], [address] Phone: [phone]   Fax:  [phone]  Name: [name] MRN: [id number] Date of Birth: [birth year]

## 2017-07-20 NOTE — Patient Instructions (Signed)
Access Code: [email or web address]  URL: [email or web address]  Date: 07/20/2017  Prepared by: Voncille Lo   Exercises  Gastroc Stretch on Wall - 3 reps - 1 sets - 30 hold - 2x daily - 7x weekly  Long Sitting Calf Stretch with Strap - 3 reps - 1 sets - 30 hold - 2x daily - 7x weekly  Supine Straight Leg Raises - 10 reps - 2 sets - 2x daily - 7x weekly  Supine Quad Set - 10 reps - 3 sets - 3 hold - 2x daily - 7x weekly  Forward Step Up - 10 reps - 2 sets - 3 hold - 2x daily - 7x weekly  Heel Raises with Counter Support - 10 reps - 2 sets - 3 hold - 2x daily - 7x weekly  Sit to Stand with Counter Support - 10 reps - 2 sets - 2x daily - 7x weekly   Voncille Lo, PT Certified Exercise Expert for the Aging Adult  07/20/17 11:35 AM Phone: [phone] Fax: [phone]

## 2017-07-24 ENCOUNTER — Encounter: Payer: Self-pay | Admitting: Physical Therapy

## 2017-07-24 ENCOUNTER — Other Ambulatory Visit (INDEPENDENT_AMBULATORY_CARE_PROVIDER_SITE_OTHER): Payer: Self-pay | Admitting: Physician Assistant

## 2017-07-24 ENCOUNTER — Ambulatory Visit: Payer: PPO | Admitting: Physical Therapy

## 2017-07-24 DIAGNOSIS — M25571 Pain in right ankle and joints of right foot: Secondary | ICD-10-CM

## 2017-07-24 DIAGNOSIS — G8929 Other chronic pain: Secondary | ICD-10-CM

## 2017-07-24 DIAGNOSIS — M6281 Muscle weakness (generalized): Secondary | ICD-10-CM

## 2017-07-24 DIAGNOSIS — M25561 Pain in right knee: Principal | ICD-10-CM

## 2017-07-24 DIAGNOSIS — Z79899 Other long term (current) drug therapy: Secondary | ICD-10-CM

## 2017-07-24 DIAGNOSIS — R262 Difficulty in walking, not elsewhere classified: Secondary | ICD-10-CM

## 2017-07-24 NOTE — Therapy (Signed)
[place], [place], [address] Phone: [phone]   Fax:  [phone]  Physical Therapy Treatment  Patient Details  Name: [name] MRN: [id number] Date of Birth: 1951/02/21 Referring Provider: currence, Maia Breslow PA Clent Demark   Encounter Date: 07/24/2017  PT End of Session - 07/24/17 1341    Visit Number  3    Number of Visits  16    Date for PT Re-Evaluation  08/28/17    Authorization Type  Medicare     PT Start Time  0130    PT Stop Time  0210    PT Time Calculation (min)  40 min       Past Medical History:  Diagnosis Date  . Abnormal EKG 2012   hospitalized for T wave inversion in lateral leads with MSK chest pains, normal ECHO, negative trops, no cardiology consult. recent EKG 7/15 stable T wave inversions.   . 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 Arthritis 2000  . Heart murmur   . Hyperlipidemia   . Hypertension 2010  . Meniscus tear    L KNEE    Past Surgical History:  Procedure Laterality Date  . ABDOMINAL SURGERY  2010   for bowel blockage  . KNEE ARTHROSCOPY Left 09/25/2013   Procedure: LEFT KNEE ARTHROSCOPY WITH PARTIAL MEDIAL AND LATERAL  MENISCECTOMY/DEBRIDEMENT/MICRO FRACTURE MEDIAL FEMORAL CONDYLE, REMOVAL OF OSTEOCONDRAL FRAGMENT;  Surgeon: Johnn Hai, MD;  Location: [facility];  Service: Orthopedics;  Laterality: Left;  . KNEE SURGERY Right 1999  . TEE WITHOUT CARDIOVERSION N/A 10/04/2015   Procedure: TRANSESOPHAGEAL ECHOCARDIOGRAM (TEE);  Surgeon: Sueanne Margarita, MD;  Location: [facility] ENDOSCOPY;  Service: Cardiovascular;  Laterality: N/A;  . TUBAL LIGATION  1979    There were no vitals filed for this visit.  Subjective Assessment - 07/24/17 1333    Subjective  No pain right now. Doing the exercises.     Currently in Pain?  No/denies         [facility] PT Assessment - 07/24/17 0001      AROM   Right Knee Flexion  128                   [facility]  Adult PT Treatment/Exercise - 07/24/17 0001      Knee/Hip Exercises: Seated   Other Seated Knee/Hip Exercises  red t band resisited flex ext x 15 each       Knee/Hip Exercises: Supine   Quad Sets  10 reps;Left    Quad Sets Limitations  x2    Short Arc Quad Sets  10 reps 3#    Straight Leg Raises  20 reps    Other Supine Knee/Hip Exercises  sit to stand with chair 10 x 2   pt fatigues      Ankle Exercises: Standing   Heel Raises  10 reps    Heel Raises Limitations  x 2 with counter hold    Other Standing Ankle Exercises  step up forward c UE support 2 x 10      Ankle Exercises: Stretches   Gastroc Stretch  3 reps;30 seconds right standing      Ankle Exercises: Supine   T-Band  4 way red band                PT Short Term Goals - 07/20/17 1206      PT SHORT TERM GOAL #1   Title  "Independent with initial HEP  Baseline  Given initial HEP    Period  Weeks    Status  On-going      PT SHORT TERM GOAL #2   Title  "Pt will ambulate with LRAD and without limp    Baseline  Pt with wide based antalgic gait with cane    Time  3    Period  Weeks    Status  On-going      PT SHORT TERM GOAL #3   Title  Pt will gain right ankle dorsiflexion at least 5 degrees    Baseline  0 at eval    Time  3    Period  Weeks    Status  On-going        PT Long Term Goals - 07/03/17 1851      PT LONG TERM GOAL #1   Title  "Pt will be independent with advanced HEP.     Time  8    Period  Weeks    Status  New    Target Date  08/28/17      PT LONG TERM GOAL #2   Title  "FOTO will improve from   68% limitation  to  49% limtation   indicating improved functional mobility     Time  8    Period  Weeks    Status  New    Target Date  08/28/17      PT LONG TERM GOAL #3   Title  "Pt will improve her R knee flexion to  >/= 130degrees and extension to </= 2 degrees with </= 2/10 pain for a more functional and efficient gait pattern    Time  8    Period  Weeks    Status  New    Target  Date  08/28/17      PT LONG TERM GOAL #4   Title  Pt will be able to negotiate steps with step over  step techinique and minimal use of hands in order to climb 10 steps in home with 75% greater ease    Baseline  Pt steps one at a time and must use UE for support    Time  8    Period  Weeks    Status  New    Target Date  08/28/17      PT LONG TERM GOAL #5   Title  Pt will be able to wear normal tennis shoes and be able to vacuum house without exacerbating pain in right LE greater than 2/10    Baseline  Pt in CAM boot on evaluation and significant swelling of R ankle    Time  8    Period  Weeks    Status  New    Target Date  08/28/17            Plan - 07/24/17 1351    Clinical Impression Statement  Pt arrives with slip on shoes and reports she was unable to get shoes yet and she forgot to bring the adjustable cane. We reviewed HEP. Step up on 4 and 6 inch step required max cues to avoid compensation. Knee AROM improved.     PT Next Visit Plan  Review exercises and sequence. Pt needs a shorter cane and will bring and wear tennis shoes; measure DF    PT Home Exercise Plan  ankle AROM, resisted knee flex/ext with red t band sit to stand, gastroc stretch heel raises,, SLR and Quad set and forward knee step ups  Consulted and Agree with Plan of Care  Patient       Patient will benefit from skilled therapeutic intervention in order to improve the following deficits and impairments:  Difficulty walking, Pain, Increased muscle spasms, Decreased strength, Decreased range of motion, Decreased mobility, Postural dysfunction, Improper body mechanics  Visit Diagnosis: Chronic pain of right knee  Pain in right ankle and joints of right foot  Difficulty in walking, not elsewhere classified  Muscle weakness (generalized)     Problem List Patient Active Problem List   Diagnosis Date Noted  . 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 HLD  (hyperlipidemia)   . Aortic atherosclerosis ([place])   . Right-sided low back pain without sciatica 07/08/2015  . 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 Bilateral leg pain 06/14/2015  . Seasonal allergies 03/09/2015  . Hypopigmentation 03/09/2015  . Gingivitis 11/10/2014  . Osteoarthritis of left knee 07/08/2014  . DJD (degenerative joint disease), lumbar 07/08/2014  . Osteoarthritis of right knee 07/08/2014  . Foot swelling 06/11/2014  . Pain due to onychomycosis of toenail 06/11/2014  . Borborygmi 06/11/2014  . Allergic rhinoconjunctivitis of both eyes 06/11/2014  . Nearsightedness 04/27/2014  . Abnormal EKG   . Hypertension 12/11/2013    Dorene Ar , PTA 07/24/2017, 2:20 PM  [facility] [address] [place], [place], [address] Phone: [phone]   Fax:  [phone]  Name: [name] MRN: [id number] Date of Birth: 01/10/1951

## 2017-07-24 NOTE — Telephone Encounter (Signed)
FWD to PCP. [name], CMA  

## 2017-07-24 NOTE — Telephone Encounter (Signed)
Called to inform patient that she needs to be seen in clinic before potassium can be refilled. Patient phone went directly to voicemail and voicemail is full. If patient calls clinic please schedule an appointment. Nat Christen, CMA

## 2017-07-25 ENCOUNTER — Other Ambulatory Visit: Payer: Self-pay | Admitting: Physician Assistant

## 2017-07-25 DIAGNOSIS — J301 Allergic rhinitis due to pollen: Secondary | ICD-10-CM

## 2017-07-26 ENCOUNTER — Encounter: Payer: Self-pay | Admitting: Physical Therapy

## 2017-07-26 ENCOUNTER — Ambulatory Visit: Payer: PPO | Attending: Physician Assistant | Admitting: Physical Therapy

## 2017-07-26 DIAGNOSIS — M6281 Muscle weakness (generalized): Secondary | ICD-10-CM | POA: Insufficient documentation

## 2017-07-26 DIAGNOSIS — M25561 Pain in right knee: Secondary | ICD-10-CM | POA: Diagnosis not present

## 2017-07-26 DIAGNOSIS — M25571 Pain in right ankle and joints of right foot: Secondary | ICD-10-CM | POA: Insufficient documentation

## 2017-07-26 DIAGNOSIS — G8929 Other chronic pain: Secondary | ICD-10-CM | POA: Diagnosis not present

## 2017-07-26 DIAGNOSIS — R262 Difficulty in walking, not elsewhere classified: Secondary | ICD-10-CM | POA: Insufficient documentation

## 2017-07-26 NOTE — Patient Instructions (Signed)
   Copyright  VHI. All rights reserved.  Forward   Facing step, place one leg on step, flexed at hip. Step up slowly, bringing hips in line with knee and shoulder. Bring other foot onto step. Reverse process to step back down. Repeat with other leg. Make sure you hold onto chair rail.  Do _15 __ repetitions, __2__ sets.  2 times a day  http://bt.exer.us/154     Copyright  VHI. All rights reserved.   Voncille Lo, PT Certified Exercise Expert for the Aging Adult  07/26/17 1:53 PM Phone: [phone] Fax: [phone]

## 2017-07-26 NOTE — Therapy (Signed)
[place], [place], [address] Phone: [phone]   Fax:  [phone]  Physical Therapy Treatment  Patient Details  Name: [name] MRN: [id number] Date of Birth: 17-Jul-1950 Referring Provider: currence, Maia Breslow PA Clent Demark   Encounter Date: 07/26/2017  PT End of Session - 07/26/17 1428    Visit Number  4    Number of Visits  16    Date for PT Re-Evaluation  08/28/17    Authorization Type  Medicare     PT Start Time  1330    PT Stop Time  1416    PT Time Calculation (min)  46 min    Activity Tolerance  Patient tolerated treatment well    Behavior During Therapy  [facility] for tasks assessed/performed       Past Medical History:  Diagnosis Date  . Abnormal EKG 2012   hospitalized for T wave inversion in lateral leads with MSK chest pains, normal ECHO, negative trops, no cardiology consult. recent EKG 7/15 stable T wave inversions.   . 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 Arthritis 2000  . Heart murmur   . Hyperlipidemia   . Hypertension 2010  . Meniscus tear    L KNEE    Past Surgical History:  Procedure Laterality Date  . ABDOMINAL SURGERY  2010   for bowel blockage  . KNEE ARTHROSCOPY Left 09/25/2013   Procedure: LEFT KNEE ARTHROSCOPY WITH PARTIAL MEDIAL AND LATERAL  MENISCECTOMY/DEBRIDEMENT/MICRO FRACTURE MEDIAL FEMORAL CONDYLE, REMOVAL OF OSTEOCONDRAL FRAGMENT;  Surgeon: Johnn Hai, MD;  Location: [facility];  Service: Orthopedics;  Laterality: Left;  . KNEE SURGERY Right 1999  . TEE WITHOUT CARDIOVERSION N/A 10/04/2015   Procedure: TRANSESOPHAGEAL ECHOCARDIOGRAM (TEE);  Surgeon: Sueanne Margarita, MD;  Location: [facility] ENDOSCOPY;  Service: Cardiovascular;  Laterality: N/A;  . TUBAL LIGATION  1979    There were no vitals filed for this visit.  Subjective Assessment - 07/26/17 1337    Subjective  I have been trying to do walking and i have my new cane.     How long can you sit  comfortably?  1 hour    How long can you stand comfortably?  20 minutes    How long can you walk comfortably?  20 minutes    Diagnostic tests  x ray    Patient Stated Goals  get strength back, walk without a cane, walk up and down steps     Currently in Pain?  Yes    Pain Score  4  If I twist it it feels like cramping    Pain Location  Knee    Pain Orientation  Right    Pain Descriptors / Indicators  Cramping    Pain Onset  More than a month ago    Pain Frequency  Intermittent    Pain Score  4    Pain Location  Ankle    Pain Orientation  Right    Pain Descriptors / Indicators  Sore    Pain Type  Chronic pain    Pain Onset  More than a month ago    Pain Frequency  Intermittent         [facility] PT Assessment - 07/26/17 1348      AROM   Right Knee Flexion  130    Left Knee Flexion  136    Right Ankle Dorsiflexion  5    Right Ankle Plantar Flexion  40    Right Ankle  Inversion  28    Right Ankle Eversion  15                   [facility] Adult PT Treatment/Exercise - 07/26/17 1348      Ambulation/Gait   Ambulation/Gait  Yes    Ambulation/Gait Assistance  6: Modified independent (Device/Increase time)    Ambulation Distance (Feet)  300 Feet    Gait Pattern  Decreased stance time - right;Decreased stride length;Decreased dorsiflexion - right    Ambulation Surface  Level    Gait velocity  3.03 ft/sec    Stairs  Yes    Stairs Assistance  6: Modified independent (Device/Increase time)    Stair Management Technique  One rail Right;Step to pattern without cane    Pre-Gait Activities  right single limb clock for 2 minutes    Gait Comments  wt shifting and cane adjusted for proper height      Knee/Hip Exercises: Seated   Other Seated Knee/Hip Exercises  green t band resisited flex ext x 15 each       Knee/Hip Exercises: Supine   Quad Sets  10 reps;Left    Short Arc Quad Sets  10 reps 3#    Straight Leg Raises  20 reps;10 reps;Right;2 sets    Straight Leg Raises  Limitations  5#    Other Supine Knee/Hip Exercises  sit to stand with chair 10 x 10 x without using chair slower      Manual Therapy   Manual Therapy  Soft tissue mobilization    Joint Mobilization  right joint mobs talocrural and sub talar    Soft tissue mobilization  right ankle soft tissue      Ankle Exercises: Stretches   Gastroc Stretch  -- right standing      Ankle Exercises: Standing   Rocker Board  2 minutes bil seated    Heel Raises  10 reps    Heel Raises Limitations  x 2 with counter hold attempted one leg stance with heel raise . only able to do 4    Other Standing Ankle Exercises  step up forward c UE support 2 x 10      Ankle Exercises: Supine   T-Band  4 way red band              PT Education - 07/26/17 1416    Education provided  Yes    Education Details  added supine clamshell and forward step up    Person(s) Educated  Patient    Methods  Explanation;Demonstration;Tactile cues;Verbal cues    Comprehension  Verbalized understanding;Returned demonstration;Other (comment) needs reinforcement of HEP       PT Short Term Goals - 07/26/17 1426      PT SHORT TERM GOAL #1   Title  "Independent with initial HEP    Baseline  reinforcing HEP and added strength    Time  3    Period  Weeks    Status  On-going      PT SHORT TERM GOAL #2   Title  "Pt will ambulate with LRAD and without limp    Baseline  Pt walking with a cane at correct height with decreased antalgic gait, pt with swelling in right ankle    Time  3    Period  Weeks    Status  On-going      PT SHORT TERM GOAL #3   Title  Pt will gain right ankle dorsiflexion at least 5 degrees  Baseline  5 degrees 07-26-17    Time  3    Period  Weeks    Status  Achieved        PT Long Term Goals - 07/03/17 1851      PT LONG TERM GOAL #1   Title  "Pt will be independent with advanced HEP.     Time  8    Period  Weeks    Status  New    Target Date  08/28/17      PT LONG TERM GOAL #2   Title   "FOTO will improve from   68% limitation  to  49% limtation   indicating improved functional mobility     Time  8    Period  Weeks    Status  New    Target Date  08/28/17      PT LONG TERM GOAL #3   Title  "Pt will improve her R knee flexion to  >/= 130degrees and extension to </= 2 degrees with </= 2/10 pain for a more functional and efficient gait pattern    Time  8    Period  Weeks    Status  New    Target Date  08/28/17      PT LONG TERM GOAL #4   Title  Pt will be able to negotiate steps with step over  step techinique and minimal use of hands in order to climb 10 steps in home with 75% greater ease    Baseline  Pt steps one at a time and must use UE for support    Time  8    Period  Weeks    Status  New    Target Date  08/28/17      PT LONG TERM GOAL #5   Title  Pt will be able to wear normal tennis shoes and be able to vacuum house without exacerbating pain in right LE greater than 2/10    Baseline  Pt in CAM boot on evaluation and significant swelling of R ankle    Time  8    Period  Weeks    Status  New    Target Date  08/28/17            Plan - 07/26/17 1428    Clinical Impression Statement  Pt arrives with adjustable cane needing adjustment and pt gait trained on steps and level surfaces.  Gait velocity 3.03 ft/sec.  Pt achieved STG #3 gained DF in Right to 5 degrees.  Pt now wearing tennis shoes full time    Rehab Potential  Good    PT Frequency  2x / week    PT Duration  8 weeks    PT Treatment/Interventions  ADLs/Self Care Home Management;Cryotherapy;Electrical Stimulation;Iontophoresis 4mg /ml Dexamethasone;Moist Heat;Ultrasound;Taping;Dry needling;Passive range of motion;Patient/family education;Neuromuscular re-education;Functional mobility training;Stair training;Gait training;Therapeutic activities;Therapeutic exercise    PT Next Visit Plan  add standing exercises for ankle, wt shifting for right ankle.  work on single limb stance and heel raise at counter.   check swelling    PT Home Exercise Plan  ankle AROM, resisted knee flex/ext with red t band sit to stand, gastroc stretch heel raises,, SLR and Quad set and forward knee step ups       Patient will benefit from skilled therapeutic intervention in order to improve the following deficits and impairments:  Difficulty walking, Pain, Increased muscle spasms, Decreased strength, Decreased range of motion, Decreased mobility, Postural dysfunction, Improper body mechanics  Visit Diagnosis: Chronic pain of right knee  Pain in right ankle and joints of right foot  Difficulty in walking, not elsewhere classified  Muscle weakness (generalized)     Problem List Patient Active Problem List   Diagnosis Date Noted  . 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 HLD (hyperlipidemia)   . Aortic atherosclerosis ([place])   . Right-sided low back pain without sciatica 07/08/2015  . 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 Bilateral leg pain 06/14/2015  . Seasonal allergies 03/09/2015  . Hypopigmentation 03/09/2015  . Gingivitis 11/10/2014  . Osteoarthritis of left knee 07/08/2014  . DJD (degenerative joint disease), lumbar 07/08/2014  . Osteoarthritis of right knee 07/08/2014  . Foot swelling 06/11/2014  . Pain due to onychomycosis of toenail 06/11/2014  . Borborygmi 06/11/2014  . Allergic rhinoconjunctivitis of both eyes 06/11/2014  . Nearsightedness 04/27/2014  . Abnormal EKG   . Hypertension 12/11/2013    Voncille Lo, PT Certified Exercise Expert for the Aging Adult  07/26/17 2:32 PM Phone: [phone] Fax: [facility] [address] [place], [place], [address] Phone: [phone]   Fax:  [phone]  Name: Norinne Jeane MRN: [id number] Date of Birth: 07-26-1950

## 2017-07-31 ENCOUNTER — Encounter: Payer: Self-pay | Admitting: Physical Therapy

## 2017-07-31 ENCOUNTER — Ambulatory Visit: Payer: PPO | Admitting: Physical Therapy

## 2017-07-31 DIAGNOSIS — M25561 Pain in right knee: Secondary | ICD-10-CM | POA: Diagnosis not present

## 2017-07-31 DIAGNOSIS — M25571 Pain in right ankle and joints of right foot: Secondary | ICD-10-CM

## 2017-07-31 DIAGNOSIS — R262 Difficulty in walking, not elsewhere classified: Secondary | ICD-10-CM

## 2017-07-31 DIAGNOSIS — G8929 Other chronic pain: Secondary | ICD-10-CM

## 2017-07-31 DIAGNOSIS — M6281 Muscle weakness (generalized): Secondary | ICD-10-CM

## 2017-07-31 NOTE — Therapy (Signed)
[place], [place], [address] Phone: [phone]   Fax:  [phone]  Physical Therapy Treatment  Patient Details  Name: [name] MRN: [id number] Date of Birth: Oct 17, 1950 Referring Provider: currence, Maia Breslow PA Clent Demark   Encounter Date: 07/31/2017  PT End of Session - 07/31/17 1424    Visit Number  5    Number of Visits  16    Date for PT Re-Evaluation  08/28/17    PT Start Time  1332    PT Stop Time  1415    PT Time Calculation (min)  43 min    Activity Tolerance  Patient tolerated treatment well    Behavior During Therapy  [facility] for tasks assessed/performed       Past Medical History:  Diagnosis Date  . Abnormal EKG 2012   hospitalized for T wave inversion in lateral leads with MSK chest pains, normal ECHO, negative trops, no cardiology consult. recent EKG 7/15 stable T wave inversions.   . 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 Arthritis 2000  . Heart murmur   . Hyperlipidemia   . Hypertension 2010  . Meniscus tear    L KNEE    Past Surgical History:  Procedure Laterality Date  . ABDOMINAL SURGERY  2010   for bowel blockage  . KNEE ARTHROSCOPY Left 09/25/2013   Procedure: LEFT KNEE ARTHROSCOPY WITH PARTIAL MEDIAL AND LATERAL  MENISCECTOMY/DEBRIDEMENT/MICRO FRACTURE MEDIAL FEMORAL CONDYLE, REMOVAL OF OSTEOCONDRAL FRAGMENT;  Surgeon: Johnn Hai, MD;  Location: [facility];  Service: Orthopedics;  Laterality: Left;  . KNEE SURGERY Right 1999  . TEE WITHOUT CARDIOVERSION N/A 10/04/2015   Procedure: TRANSESOPHAGEAL ECHOCARDIOGRAM (TEE);  Surgeon: Sueanne Margarita, MD;  Location: [facility] ENDOSCOPY;  Service: Cardiovascular;  Laterality: N/A;  . TUBAL LIGATION  1979    There were no vitals filed for this visit.  Subjective Assessment - 07/31/17 1337    Subjective  Uses cane  at home and outside,  I want to work on the balance. I di not have pain yesterday.  Now able to make her bed.       Currently in Pain?  Yes    Pain Score  4     Pain Location  Knee    Pain Orientation  Right;Anterior    Pain Descriptors / Indicators  Cramping stiff,  cramps    Pain Type  Chronic pain    Aggravating Factors   steps,     Pain Relieving Factors  Aleve.    Effect of Pain on Daily Activities  limits AD.  limits standing chores,  not vacune.  Able to sit and cook.     Pain Score  5 back cramps,  hard to stand straight in the morning.     Pain Location  Ankle    Pain Orientation  Right;Medial;Lateral    Pain Descriptors / Indicators  Sore    Pain Relieving Factors  heat ,, ice                       [facility] Adult PT Treatment/Exercise - 07/31/17 0001      Self-Care   Self-Care  RICE ankle anatomy.chart used to explain how exercises help.    RICE  ideas on how to get leg elevated       Knee/Hip Exercises: Aerobic   Nustep  L4 X 5 minutes UE/LE      Knee/Hip Exercises: Standing   Terminal Knee  Extension Limitations  10 x 2 sets pressing ball into wall    Forward Step Up  Right;1 set;10 reps;Hand Hold: 2;Step Height: 6"    Forward Step Up Limitations  poor knee control in extension, needed manual assist to keep in a little flexion.    Functional Squat  10 reps    Functional Squat Limitations  mod assist for knee position      Knee/Hip Exercises: Seated   Other Seated Knee/Hip Exercises  10 X knee flexion green band      Manual Therapy   Soft tissue mobilization  right ankle retrograde and   PROM toe flexion with foot in PF ,  ankle girth visably reduced.      Ankle Exercises: Standing   SLS  10 seconds with hands off and on 4/10    Heel Raises  10 reps both,  single 7/10 pain right  mod use of hands    Heel Raises Limitations  10 x both,  5 X single with mod use of hands and pain 7/10 right,  eased with brief rest.     Other Standing Ankle Exercises  2 way SLS 10 X flexion extension      Ankle Exercises: Seated   [place]  15             PT  Education - 07/31/17 1423    Education provided  Yes    Education Details  exercise form ,  anatomy foot    Person(s) Educated  Patient    Methods  Explanation;Demonstration;Tactile cues;Verbal cues    Comprehension  Verbalized understanding;Returned demonstration       PT Short Term Goals - 07/31/17 1427      PT SHORT TERM GOAL #1   Title  "Independent with initial HEP    Baseline   minor cues for technique  for ones reviewed today    Time  3    Period  Weeks    Status  On-going      PT SHORT TERM GOAL #2   Title  "Pt will ambulate with LRAD and without limp    Baseline  cane with antalgic gait,  some limp due to back pain.    Time  3    Period  Weeks    Status  On-going      PT SHORT TERM GOAL #3   Title  Pt will gain right ankle dorsiflexion at least 5 degrees    Time  3    Period  Weeks    Status  Achieved        PT Long Term Goals - 07/03/17 1851      PT LONG TERM GOAL #1   Title  "Pt will be independent with advanced HEP.     Time  8    Period  Weeks    Status  New    Target Date  08/28/17      PT LONG TERM GOAL #2   Title  "FOTO will improve from   68% limitation  to  49% limtation   indicating improved functional mobility     Time  8    Period  Weeks    Status  New    Target Date  08/28/17      PT LONG TERM GOAL #3   Title  "Pt will improve her R knee flexion to  >/= 130degrees and extension to </= 2 degrees with </= 2/10 pain for a more functional and efficient  gait pattern    Time  8    Period  Weeks    Status  New    Target Date  08/28/17      PT LONG TERM GOAL #4   Title  Pt will be able to negotiate steps with step over  step techinique and minimal use of hands in order to climb 10 steps in home with 75% greater ease    Baseline  Pt steps one at a time and must use UE for support    Time  8    Period  Weeks    Status  New    Target Date  08/28/17      PT LONG TERM GOAL #5   Title  Pt will be able to wear normal tennis shoes and be able to  vacuum house without exacerbating pain in right LE greater than 2/10    Baseline  Pt in CAM boot on evaluation and significant swelling of R ankle    Time  8    Period  Weeks    Status  New    Target Date  08/28/17            Plan - 07/31/17 1424    Clinical Impression Statement  Patient able to SLS right 3 seconds max with no hands.  Patient required frequent brief rests between exercises due to fatigue.  Patient continues to need work with knee strength in terminal range.  She declined the need of modalities with a pain levelo of 4/10 at end of session.      PT Next Visit Plan  add standing exercises for ankle, wt shifting for right ankle. Continue to  work on single limb stance and heel raise at counter. and terminal knee control.  check swelling    PT Home Exercise Plan  ankle AROM, resisted knee flex/ext with red t band sit to stand, gastroc stretch heel raises,, SLR and Quad set and forward knee step ups    Consulted and Agree with Plan of Care  Patient       Patient will benefit from skilled therapeutic intervention in order to improve the following deficits and impairments:     Visit Diagnosis: Chronic pain of right knee  Pain in right ankle and joints of right foot  Difficulty in walking, not elsewhere classified  Muscle weakness (generalized)     Problem List Patient Active Problem List   Diagnosis Date Noted  . 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 HLD (hyperlipidemia)   . Aortic atherosclerosis ([place])   . Right-sided low back pain without sciatica 07/08/2015  . 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 Bilateral leg pain 06/14/2015  . Seasonal allergies 03/09/2015  . Hypopigmentation 03/09/2015  . Gingivitis 11/10/2014  . Osteoarthritis of left knee 07/08/2014  . DJD (degenerative joint disease), lumbar 07/08/2014  . Osteoarthritis of right knee 07/08/2014  . Foot swelling 06/11/2014  . Pain due to  onychomycosis of toenail 06/11/2014  . Borborygmi 06/11/2014  . Allergic rhinoconjunctivitis of both eyes 06/11/2014  . Nearsightedness 04/27/2014  . Abnormal EKG   . Hypertension 12/11/2013    Yavonne Kiss PTA 07/31/2017, 2:30 PM  [facility] [address] [place], [place], [address] Phone: [phone]   Fax:  [phone]  Name: [name] MRN: [id number] Date of Birth: 08-06-1950

## 2017-08-02 ENCOUNTER — Encounter: Payer: Self-pay | Admitting: Physical Therapy

## 2017-08-02 ENCOUNTER — Ambulatory Visit: Payer: PPO | Admitting: Physical Therapy

## 2017-08-02 DIAGNOSIS — M25571 Pain in right ankle and joints of right foot: Secondary | ICD-10-CM

## 2017-08-02 DIAGNOSIS — R262 Difficulty in walking, not elsewhere classified: Secondary | ICD-10-CM

## 2017-08-02 DIAGNOSIS — G8929 Other chronic pain: Secondary | ICD-10-CM

## 2017-08-02 DIAGNOSIS — M25561 Pain in right knee: Principal | ICD-10-CM

## 2017-08-02 DIAGNOSIS — M6281 Muscle weakness (generalized): Secondary | ICD-10-CM

## 2017-08-02 NOTE — Patient Instructions (Addendum)
Body-Weight Step-Up: Stable (Active)   Head up, back flat, place right foot on step. Bring other leg up on to step. Complete _1__ sets of _15__ repetitions. Perform __1_ sessions per day.  http://gtsc.exer.us/512   Copyright  VHI. All rights reserved.  Functional Quadriceps: Sit to Stand   Sit on edge of chair, feet flat on floor. Stand upright, extending knees fully. Repeat _15__ times per set. Do _2___ sets per session. Do __3__ sessions per day.  http://orth.exer.us/734   Copyright  VHI. All rights reserved.  Strengthening: Wall Slide   Leaning on wall, slowly lower buttocks until thighs are parallel to floor. Hold _10___ seconds. Tighten thigh muscles and return. Repeat 10____ times per set. Do __1__ sets per session. Do _1___ sessions per day.    HIP: Abduction / External Rotation (Band)   Place band around knees. Lie on side with hips and knees bent. Keep ankles together and just separate knees. Raise top knee up, squeezing glutes. Keep feet together. Hold 5___ seconds. Use __red ______ band. _15__ reps per set, __2_ sets per day, _7__ days per week  Bridge     http://orth.exer.us/38   Heel Raise: Standing   Toes on edge of board,  Come up on both toes and then drop heel below step to stretch back of calf muscles,  Do 10 times.  Hold for 10 seconds hold onto chair rail for safety.  http://st.exer.us/132   Voncille Lo, PT Certified Exercise Expert for the Aging Adult  08/02/17 2:21 PM Phone: [phone] Fax: [phone]

## 2017-08-02 NOTE — Therapy (Addendum)
[place], [place], [address] Phone: [phone]   Fax:  [phone]  Physical Therapy Treatment  Patient Details  Name: [name] MRN: [id number] Date of Birth: 30-Nov-1950 Referring Provider: currence, Maia Breslow PA Clent Demark   Encounter Date: 08/02/2017  PT End of Session - 08/02/17 1449    Visit Number  6    Number of Visits  16    Date for PT Re-Evaluation  08/28/17    Authorization Type  Medicare     PT Start Time  1332    PT Stop Time  1415    PT Time Calculation (min)  43 min    Activity Tolerance  Patient tolerated treatment well    Behavior During Therapy  [facility] for tasks assessed/performed       Past Medical History:  Diagnosis Date  . Abnormal EKG 2012   hospitalized for T wave inversion in lateral leads with MSK chest pains, normal ECHO, negative trops, no cardiology consult. recent EKG 7/15 stable T wave inversions.   . 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 Arthritis 2000  . Heart murmur   . Hyperlipidemia   . Hypertension 2010  . Meniscus tear    L KNEE    Past Surgical History:  Procedure Laterality Date  . ABDOMINAL SURGERY  2010   for bowel blockage  . KNEE ARTHROSCOPY Left 09/25/2013   Procedure: LEFT KNEE ARTHROSCOPY WITH PARTIAL MEDIAL AND LATERAL  MENISCECTOMY/DEBRIDEMENT/MICRO FRACTURE MEDIAL FEMORAL CONDYLE, REMOVAL OF OSTEOCONDRAL FRAGMENT;  Surgeon: Johnn Hai, MD;  Location: [facility];  Service: Orthopedics;  Laterality: Left;  . KNEE SURGERY Right 1999  . TEE WITHOUT CARDIOVERSION N/A 10/04/2015   Procedure: TRANSESOPHAGEAL ECHOCARDIOGRAM (TEE);  Surgeon: Sueanne Margarita, MD;  Location: [facility] ENDOSCOPY;  Service: Cardiovascular;  Laterality: N/A;  . TUBAL LIGATION  1979    There were no vitals filed for this visit.  Subjective Assessment - 08/02/17 1442    Subjective  Uses cane ambulates with slight antalgic gait but much improved from evaluation     Limitations  Standing;Walking    Patient Stated Goals  get strength back, walk without a cane, walk up and down steps     Currently in Pain?  Yes    Pain Score  3     Pain Location  Knee    Pain Orientation  Right;Anterior    Pain Descriptors / Indicators  Tender    Pain Type  Chronic pain    Pain Onset  More than a month ago    Pain Frequency  Intermittent    Pain Score  3    Pain Location  Ankle    Pain Orientation  Right;Medial    Pain Descriptors / Indicators  Sore    Pain Onset  More than a month ago         [facility] PT Assessment - 08/02/17 1430      Functional Tests   Functional tests  -- right 5 sec left 30 sec      Single Leg Stance   Comments  right 5 sec, left 30 sec      Sit to Stand   Comments  13 in 30 seconds      AROM   Right Ankle Dorsiflexion  7    Right Ankle Plantar Flexion  47      Strength   Right Ankle Dorsiflexion  4-/5    Right Ankle Plantar Flexion  4-/5  [place] Adult PT Treatment/Exercise - 08/02/17 1359      Ambulation/Gait   Pre-Gait Activities  right single limib clock for 2 minutes      Knee/Hip Exercises: Aerobic   Recumbent Bike  Level 4 5 min      Knee/Hip Exercises: Standing   Terminal Knee Extension Limitations  10 x 2 sets pressing ball into wall    Forward Step Up  Right;1 set;10 reps;Hand Hold: 2;Step Height: 6"    Forward Step Up Limitations  needs min to mod cuing for correct alignment    Functional Squat  10 reps    Functional Squat Limitations  mod assist for knee position at sink    Wall Squat  10 reps;10 seconds fatigues at 8      Knee/Hip Exercises: Supine   Quad Sets  Strengthening;15 reps;1 set    Bridges  1 set;10 reps    Bridges with [employer]  1 set;10 reps    Other Supine Knee/Hip Exercises  sit ot stand 15 x 2   tested 13 x in 30 seconds      Manual Therapy   Joint Mobilization  right joint mobs talocrural and sub talar grade 3/4    Soft tissue mobilization  right ankle soft  tissue PROM right great toe, easily pliable today      Ankle Exercises: Standing   SLS  10 seconds with hands off and on 4/8    Heel Raises  10 reps    Heel Raises Limitations  10 bil,  5 X single with mod use of hands and pain 7/10 right,  eased with brief rest.     Other Standing Ankle Exercises  2 way SLS 10 X flexion extension             PT Education - 08/02/17 1422    Education provided  Yes    Education Details  advanced HEP    Person(s) Educated  Patient    Methods  Demonstration;Explanation;Tactile cues;Verbal cues;Handout    Comprehension  Verbalized understanding;Returned demonstration       PT Short Term Goals - 08/02/17 1337      PT SHORT TERM GOAL #1   Title  "Independent with initial HEP    Baseline  Pt able to perform intial HEP independently  moving on to advanced today    Time  3    Period  Weeks    Status  Achieved      PT SHORT TERM GOAL #2   Title  "Pt will ambulate with LRAD and without limp    Baseline  pt uses cane without limo today    Time  3    Period  Weeks    Status  Achieved      PT SHORT TERM GOAL #3   Title  Pt will gain right ankle dorsiflexion at least 5 degrees    Baseline  7 degrees    Time  3    Status  Achieved        PT Long Term Goals - 08/02/17 1441      PT LONG TERM GOAL #1   Title  "Pt will be independent with advanced HEP.     Baseline  began standing, proprioceptive and hip stretngthening advanced today    Time  8    Period  Weeks    Status  On-going      PT LONG TERM GOAL #2   Title  "FOTO will improve from  68% limitation  to  49% limtation   indicating improved functional mobility     Time  8    Period  Weeks    Status  Unable to assess      PT LONG TERM GOAL #3   Title  "Pt will improve her R knee flexion to  >/= 130degrees and extension to </= 2 degrees with </= 2/10 pain for a more functional and efficient gait pattern    Baseline  right knee is 130 with 3/10 pain    Time  8    Period  Weeks     Status  On-going      PT LONG TERM GOAL #4   Title  Pt will be able to negotiate steps with step over  step techinique and minimal use of hands in order to climb 10 steps in home with 75% greater ease    Baseline  Pt beginning reciprocal gait on steps, UE support necessary    Time  8    Period  Weeks    Status  On-going      PT LONG TERM GOAL #5   Title  Pt will be able to wear normal tennis shoes and be able to vacuum house without exacerbating pain in right LE greater than 2/10    Baseline  Pt wearing normal tennis shoes today  pain 3/10    Time  8    Period  Weeks    Status  On-going            Plan - 08/02/17 1445    Clinical Impression Statement  Pt is able to SLS for 3-10 seconds today with no hands by counter. All STG's achieved today  and DF improved on right to 7/10.  Pt fatiques but was able to perform sit to stand 13 times in 30 seconds.  Working on hip and knee and ankle strength in standing  She declinied use of modalities. 3/10 at end of session.  MMT in DF 4-/5 and  PF on right 4-/5.  wil continue toward LTG's    Rehab Potential  Good    PT Frequency  2x / week    PT Duration  8 weeks    PT Treatment/Interventions  ADLs/Self Care Home Management;Cryotherapy;Electrical Stimulation;Iontophoresis 4mg /ml Dexamethasone;Moist Heat;Ultrasound;Taping;Dry needling;Passive range of motion;Patient/family education;Neuromuscular re-education;Functional mobility training;Stair training;Gait training;Therapeutic activities;Therapeutic exercise    PT Next Visit Plan  review HEP for standing.  continue proprioception and ankle/knee/hip strength. , wt shifting for right ankle. Continue to  work on single limb stance and heel raise at counter. and terminal knee control.  check swelling    PT Home Exercise Plan  ankle AROM, resisted knee flex/ext with red t band sit to stand, gastroc stretch heel raises,, SLR and Quad set and forward knee step ups    Consulted and Agree with Plan of Care   Patient       Patient will benefit from skilled therapeutic intervention in order to improve the following deficits and impairments:  Difficulty walking, Pain, Increased muscle spasms, Decreased strength, Decreased range of motion, Decreased mobility, Postural dysfunction, Improper body mechanics  Visit Diagnosis: Chronic pain of right knee  Pain in right ankle and joints of right foot  Difficulty in walking, not elsewhere classified  Muscle weakness (generalized)     Problem List Patient Active Problem List   Diagnosis Date Noted  . 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 HLD (hyperlipidemia)   .  Aortic atherosclerosis ([place])   . Right-sided low back pain without sciatica 07/08/2015  . 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 Bilateral leg pain 06/14/2015  . Seasonal allergies 03/09/2015  . Hypopigmentation 03/09/2015  . Gingivitis 11/10/2014  . Osteoarthritis of left knee 07/08/2014  . DJD (degenerative joint disease), lumbar 07/08/2014  . Osteoarthritis of right knee 07/08/2014  . Foot swelling 06/11/2014  . Pain due to onychomycosis of toenail 06/11/2014  . Borborygmi 06/11/2014  . Allergic rhinoconjunctivitis of both eyes 06/11/2014  . Nearsightedness 04/27/2014  . Abnormal EKG   . Hypertension 12/11/2013   Voncille Lo, PT Certified Exercise Expert for the Aging Adult  08/02/17 2:51 PM Phone: [phone] Fax: [place] [facility] [address] [place], [place], [address] Phone: [phone]   Fax:  [phone]  Name: [name] MRN: [id number] Date of Birth: 01/16/1951

## 2017-08-07 ENCOUNTER — Ambulatory Visit: Payer: PPO | Admitting: Physical Therapy

## 2017-08-07 ENCOUNTER — Other Ambulatory Visit: Payer: Self-pay

## 2017-08-07 ENCOUNTER — Emergency Department (HOSPITAL_COMMUNITY)
Admission: EM | Admit: 2017-08-07 | Discharge: 2017-08-08 | Disposition: A | Discharge status: Home / Self Care | Payer: PPO | Attending: Emergency Medicine | Admitting: Emergency Medicine

## 2017-08-07 ENCOUNTER — Encounter: Payer: Self-pay | Admitting: Physical Therapy

## 2017-08-07 DIAGNOSIS — K029 Dental caries, unspecified: Secondary | ICD-10-CM | POA: Diagnosis not present

## 2017-08-07 DIAGNOSIS — Z7982 Long term (current) use of aspirin: Secondary | ICD-10-CM | POA: Insufficient documentation

## 2017-08-07 DIAGNOSIS — Z79899 Other long term (current) drug therapy: Secondary | ICD-10-CM | POA: Insufficient documentation

## 2017-08-07 DIAGNOSIS — M25561 Pain in right knee: Principal | ICD-10-CM

## 2017-08-07 DIAGNOSIS — Z91040 Latex allergy status: Secondary | ICD-10-CM | POA: Diagnosis not present

## 2017-08-07 DIAGNOSIS — M545 Low back pain, unspecified: Secondary | ICD-10-CM

## 2017-08-07 DIAGNOSIS — R262 Difficulty in walking, not elsewhere classified: Secondary | ICD-10-CM

## 2017-08-07 DIAGNOSIS — I10 Essential (primary) hypertension: Secondary | ICD-10-CM | POA: Diagnosis not present

## 2017-08-07 DIAGNOSIS — G8929 Other chronic pain: Secondary | ICD-10-CM

## 2017-08-07 DIAGNOSIS — M6281 Muscle weakness (generalized): Secondary | ICD-10-CM

## 2017-08-07 DIAGNOSIS — Z87891 Personal history of nicotine dependence: Secondary | ICD-10-CM | POA: Diagnosis not present

## 2017-08-07 DIAGNOSIS — M25571 Pain in right ankle and joints of right foot: Secondary | ICD-10-CM

## 2017-08-07 NOTE — Therapy (Signed)
[place], [place], [address] Phone: [phone]   Fax:  [phone]  Physical Therapy Treatment  Patient Details  Name: [name] MRN: [id number] Date of Birth: 1951/01/14 Referring Provider: currence, Maia Breslow PA Clent Demark   Encounter Date: 08/07/2017  PT End of Session - 08/07/17 1413    Visit Number  7    Number of Visits  16    Date for PT Re-Evaluation  08/28/17    PT Start Time  1951    PT Stop Time  1414    PT Time Calculation (min)  46 min    Activity Tolerance  Patient tolerated treatment well    Behavior During Therapy  [facility] for tasks assessed/performed       Past Medical History:  Diagnosis Date  . Abnormal EKG 2012   hospitalized for T wave inversion in lateral leads with MSK chest pains, normal ECHO, negative trops, no cardiology consult. recent EKG 7/15 stable T wave inversions.   . 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 Arthritis 2000  . Heart murmur   . Hyperlipidemia   . Hypertension 2010  . Meniscus tear    L KNEE    Past Surgical History:  Procedure Laterality Date  . ABDOMINAL SURGERY  2010   for bowel blockage  . KNEE ARTHROSCOPY Left 09/25/2013   Procedure: LEFT KNEE ARTHROSCOPY WITH PARTIAL MEDIAL AND LATERAL  MENISCECTOMY/DEBRIDEMENT/MICRO FRACTURE MEDIAL FEMORAL CONDYLE, REMOVAL OF OSTEOCONDRAL FRAGMENT;  Surgeon: Johnn Hai, MD;  Location: [facility];  Service: Orthopedics;  Laterality: Left;  . KNEE SURGERY Right 1999  . TEE WITHOUT CARDIOVERSION N/A 10/04/2015   Procedure: TRANSESOPHAGEAL ECHOCARDIOGRAM (TEE);  Surgeon: Sueanne Margarita, MD;  Location: [facility] ENDOSCOPY;  Service: Cardiovascular;  Laterality: N/A;  . TUBAL LIGATION  1979    There were no vitals filed for this visit.  Subjective Assessment - 08/07/17 1329    Subjective  kNEE IS STILL FEELING BETTER IN THE UPPER PART( POINTS TO ANTERIOR KNEE)  Steps don't aggravate knee.    Currently in  Pain?  Yes    Pain Score  4  iT IS NOT BOTHERING ME TOO MUCH TODAY    Pain Location  Knee    Pain Orientation  Right;Anterior    Pain Onset  More than a month ago    Pain Frequency  Intermittent    Aggravating Factors   getting in /out of car,  laying in bed a certain way( curled up)    Pain Relieving Factors  [place],  stretching it out,  change of position.      Multiple Pain Sites  -- has moderate long standing back pain)  8/10 today.  worse weather.    Pain Score  3    Pain Location  Ankle    Pain Orientation  Right;Medial    Pain Descriptors / Indicators  Sore    Pain Type  Chronic pain    Aggravating Factors   end of day hurt,  long day    Pain Relieving Factors  heat ice                       [facility] Adult PT Treatment/Exercise - 08/07/17 0001      Ambulation/Gait   Stairs  Yes    Gait Comments  able to ascend X 3 steps step over step,  Terminal knee control lacking moderate use of rails.able to step over balance beam /  cane      Knee/Hip Exercises: Aerobic   Recumbent Bike  Level 4 6 min UE/LE      Knee/Hip Exercises: Standing   Terminal Knee Extension Limitations  10 x 2 sets pressing ball into wall    Forward Step Up  Right;1 set;10 reps;Hand Hold: 2;Step Height: 6"      Knee/Hip Exercises: Seated   Long Arc Quad  10 reps 10 seconds hold, AA to extension, slow eccentric    Hamstring Curl  1 set;10 reps    Hamstring Limitations  green band    Sit to [employer]  10 reps      Knee/Hip Exercises: Supine   Bridges  1 set;10 reps hamstring cramp X 1  Left.     Bridges with [employer]  1 set;10 reps      Ankle Exercises: Seated   Heel Raises  10 reps    Toe Raise  10 reps    Heel Slides  10 reps with pillow case,  cued to keep heel flat    Other Seated Ankle Exercises  Green band IV/EV 10 x each             PT Education - 08/07/17 1413    Education provided  Yes    Education Details  Gait stairs    Person(s) Educated  Patient    Methods   Explanation;Demonstration;Verbal cues    Comprehension  Verbalized understanding;Returned demonstration;Need further instruction       PT Short Term Goals - 08/02/17 1337      PT SHORT TERM GOAL #1   Title  "Independent with initial HEP    Baseline  Pt able to perform intial HEP independently  moving on to advanced today    Time  3    Period  Weeks    Status  Achieved      PT SHORT TERM GOAL #2   Title  "Pt will ambulate with LRAD and without limp    Baseline  pt uses cane without limo today    Time  3    Period  Weeks    Status  Achieved      PT SHORT TERM GOAL #3   Title  Pt will gain right ankle dorsiflexion at least 5 degrees    Baseline  7 degrees    Time  3    Status  Achieved        PT Long Term Goals - 08/02/17 1441      PT LONG TERM GOAL #1   Title  "Pt will be independent with advanced HEP.     Baseline  began standing, proprioceptive and hip stretngthening advanced today    Time  8    Period  Weeks    Status  On-going      PT LONG TERM GOAL #2   Title  "FOTO will improve from   68% limitation  to  49% limtation   indicating improved functional mobility     Time  8    Period  Weeks    Status  Unable to assess      PT LONG TERM GOAL #3   Title  "Pt will improve her R knee flexion to  >/= 130degrees and extension to </= 2 degrees with </= 2/10 pain for a more functional and efficient gait pattern    Baseline  right knee is 130 with 3/10 pain    Time  8    Period  Weeks  Status  On-going      PT LONG TERM GOAL #4   Title  Pt will be able to negotiate steps with step over  step techinique and minimal use of hands in order to climb 10 steps in home with 75% greater ease    Baseline  Pt beginning reciprocal gait on steps, UE support necessary    Time  8    Period  Weeks    Status  On-going      PT LONG TERM GOAL #5   Title  Pt will be able to wear normal tennis shoes and be able to vacuum house without exacerbating pain in right LE greater than 2/10     Baseline  Pt wearing normal tennis shoes today  pain 3/10    Time  8    Period  Weeks    Status  On-going            Plan - 08/07/17 1414    Clinical Impression Statement  Strengthening continued for leg.  Edema improving and she can now wear regulat tennis shoes.  Objective findings similar to last visit.  Back pain limiting today.    PT Next Visit Plan  review HEP for standing.  continue proprioception and ankle/knee/hip strength. , wt shifting for right ankle. Continue to  work on single limb stance and heel raise at counter. and terminal knee control.  check swelling    PT Home Exercise Plan  ankle AROM, resisted knee flex/ext with red t band sit to stand, gastroc stretch heel raises,, SLR and Quad set and forward knee step ups    Consulted and Agree with Plan of Care  Patient       Patient will benefit from skilled therapeutic intervention in order to improve the following deficits and impairments:     Visit Diagnosis: Chronic pain of right knee  Pain in right ankle and joints of right foot  Difficulty in walking, not elsewhere classified  Muscle weakness (generalized)     Problem List Patient Active Problem List   Diagnosis Date Noted  . 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 HLD (hyperlipidemia)   . Aortic atherosclerosis ([place])   . Right-sided low back pain without sciatica 07/08/2015  . 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 Bilateral leg pain 06/14/2015  . Seasonal allergies 03/09/2015  . Hypopigmentation 03/09/2015  . Gingivitis 11/10/2014  . Osteoarthritis of left knee 07/08/2014  . DJD (degenerative joint disease), lumbar 07/08/2014  . Osteoarthritis of right knee 07/08/2014  . Foot swelling 06/11/2014  . Pain due to onychomycosis of toenail 06/11/2014  . Borborygmi 06/11/2014  . Allergic rhinoconjunctivitis of both eyes 06/11/2014  . Nearsightedness 04/27/2014  . Abnormal EKG   . Hypertension  12/11/2013    HARRIS,KAREN PTA 08/07/2017, 2:16 PM  [facility] [address] [place], [place], [address] Phone: [phone]   Fax:  [phone]  Name: Kammie Scioli MRN: [id number] Date of Birth: [birth year]

## 2017-08-08 ENCOUNTER — Other Ambulatory Visit: Payer: Self-pay

## 2017-08-08 ENCOUNTER — Encounter (HOSPITAL_COMMUNITY): Payer: Self-pay | Admitting: Emergency Medicine

## 2017-08-08 LAB — URINALYSIS, ROUTINE W REFLEX MICROSCOPIC
BACTERIA UA: NONE SEEN
BILIRUBIN URINE: NEGATIVE
Glucose, UA: NEGATIVE mg/dL
Ketones, ur: NEGATIVE mg/dL
LEUKOCYTES UA: NEGATIVE
NITRITE: NEGATIVE
PROTEIN: NEGATIVE mg/dL
SPECIFIC GRAVITY, URINE: 1.013 (ref 1.005–1.030)
pH: 5 (ref 5.0–8.0)

## 2017-08-08 MED ORDER — CLINDAMYCIN HCL 150 MG PO CAPS
300.0000 mg | ORAL_CAPSULE | Freq: Once | ORAL | Status: AC
  Administered 2017-08-08: 300 mg via ORAL
  Filled 2017-08-08: qty 2

## 2017-08-08 MED ORDER — CLINDAMYCIN HCL 300 MG PO CAPS: 300.0000 mg | ORAL_CAPSULE | Freq: Three times a day (TID) | ORAL | 0 refills | Status: DC

## 2017-08-08 MED ORDER — IBUPROFEN 800 MG PO TABS: 800.0000 mg | ORAL_TABLET | Freq: Three times a day (TID) | ORAL | 0 refills | Status: DC | PRN

## 2017-08-08 MED ORDER — HYDROCODONE-ACETAMINOPHEN 5-325 MG PO TABS: 2.0000 | ORAL_TABLET | Freq: Four times a day (QID) | ORAL | 0 refills | Status: DC | PRN

## 2017-08-08 MED ORDER — IBUPROFEN 800 MG PO TABS
800.0000 mg | ORAL_TABLET | Freq: Once | ORAL | Status: AC
  Administered 2017-08-08: 800 mg via ORAL
  Filled 2017-08-08: qty 1

## 2017-08-08 NOTE — ED Triage Notes (Signed)
Pt reporting bilateral flank pain, lower back pain, and dental pain x a "few days"

## 2017-08-08 NOTE — ED Provider Notes (Signed)
TIME SEEN: 3:02 AM  CHIEF COMPLAINT: Lower back pain, dental pain  HPI: Patient is a 67 year old female with history of hypertension, hyperlipidemia who presents to the emergency department with complaints of lower back pain.  States she has had lower back pain for the past several days it is worse with movement and palpation.  No injury that she can recall.  She has been on muscle relaxers and anti-inflammatories for chronic right lower extremity pain after a fracture in December.  No numbness, tingling or focal weakness.  No bowel or bladder incontinence.  No fever.  No urinary retention.   She is also complaining of dental pain.  Most of her teeth have ready been removed but she still has 3 lower teeth with significant decay.  No facial swelling.  No difficulty breathing, speaking or swallowing.  Again no fever.  Does not have a dentist.  ROS: See HPI Constitutional: no fever  Eyes: no drainage  ENT: no runny nose   Cardiovascular:  no chest pain  Resp: no SOB  GI: no vomiting GU: no dysuria Integumentary: no rash  Allergy: no hives  Musculoskeletal: no leg swelling  Neurological: no slurred speech ROS otherwise negative  PAST MEDICAL HISTORY/PAST SURGICAL HISTORY:  Past Medical History:  Diagnosis Date  . Abnormal EKG 2012   hospitalized for T wave inversion in lateral leads with MSK chest pains, normal ECHO, negative trops, no cardiology consult. recent EKG 7/15 stable T wave inversions.   . 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 Arthritis 2000  . Heart murmur   . Hyperlipidemia   . Hypertension 2010  . Meniscus tear    L KNEE    MEDICATIONS:  Prior to Admission medications   Medication Sig Start Date End Date Taking? Authorizing Provider  amLODipine (NORVASC) 10 MG tablet Take 1 tablet (10 mg total) by mouth daily. 03/12/17   Clent Demark, PA-C  aspirin EC 81 MG tablet Take 1 tablet (81 mg total) daily by mouth. 01/31/17   Clent Demark, PA-C  atorvastatin  (LIPITOR) 40 MG tablet TAKE 1 TABLET(40 MG) BY MOUTH DAILY AT 6 PM 10/12/16   Funches, Adriana Mccallum, MD  carvedilol (COREG) 12.5 MG tablet TAKE 1 TABLET(12.5 MG) BY MOUTH TWICE DAILY WITH A MEAL 03/12/17   Clent Demark, PA-C  cyclobenzaprine (FLEXERIL) 5 MG tablet Take 1 tablet (5 mg total) by mouth 3 (three) times daily as needed for muscle spasms. 05/07/17   Ward, Ozella Almond, PA-C  etodolac (LODINE) 200 MG capsule Take 1 capsule (200 mg total) by mouth 2 (two) times daily. Patient not taking: Reported on 03/24/2017 03/12/17   Clent Demark, PA-C  fluticasone [facility]) 50 MCG/ACT nasal spray Place 2 sprays into both nostrils daily. Patient not taking: Reported on 05/07/2017 07/11/16   Boykin Nearing, MD  hydrochlorothiazide (HYDRODIURIL) 25 MG tablet Take 1 tablet (25 mg total) by mouth daily. Take on tablet in the morning. 03/12/17   Clent Demark, PA-C  ibuprofen (ADVIL,MOTRIN) 400 MG tablet Take 1 tablet (400 mg total) by mouth every 6 (six) hours as needed. 06/11/17   Varney Biles, MD  loratadine (CLARITIN) 10 MG tablet Take 1 tablet (10 mg total) by mouth daily. Patient not taking: Reported on 03/24/2017 07/11/16   Boykin Nearing, MD  Multiple Vitamin (MULITIVITAMIN WITH MINERALS) TABS Take 1 tablet by mouth daily.    [provider]  naproxen (NAPROSYN) 250 MG tablet Take 1 po BID with food prn pain 05/07/17  Ward, Ozella Almond, PA-C  oxyCODONE (OXY IR/ROXICODONE) 5 MG immediate release tablet Take 5 mg by mouth every 4 (four) hours as needed for moderate pain.  05/03/17   [provider]  oxyCODONE-acetaminophen (PERCOCET/ROXICET) 5-325 MG tablet Take 1 tablet by mouth every 4 (four) hours as needed for severe pain. Patient not taking: Reported on 05/07/2017 03/24/17   Pattricia Boss, MD  potassium chloride (K-DUR) 10 MEQ tablet Take 1 tablet (10 mEq total) by mouth daily. Patient not taking: Reported on 07/03/2017 05/10/17   Clent Demark, PA-C  potassium  chloride SA (K-DUR,KLOR-CON) 20 MEQ tablet TAKE 1 TABLET BY MOUTH DAILY 03/28/17   Clent Demark, PA-C  tiZANidine (ZANAFLEX) 4 MG tablet TAKE 1 TABLET(4 MG) BY MOUTH EVERY 8 HOURS AS NEEDED FOR MUSCLE SPASMS 09/26/16   Boykin Nearing, MD    ALLERGIES:  Allergies  Allergen Reactions  . Shrimp [Shellfish Allergy] Hives and Itching  . Latex Itching  . Penicillins Itching and Rash        SOCIAL HISTORY:  Social History   Tobacco Use  . Smoking status: Former Smoker    Last attempt to quit: 07/29/2010    Years since quitting: 7.0  . Smokeless tobacco: Never Used  Substance Use Topics  . Alcohol use: No    Alcohol/week: 0.0 oz    FAMILY HISTORY: Family History  Problem Relation Age of Onset  . Hypertension Mother   . Heart disease Mother        has a pacemaker   . Heart failure Father   . Hypertension Father   . Cancer Neg Hx   . Colon cancer Neg Hx     EXAM: BP (!) 147/70 (BP Location: Right Arm)   Pulse 70   Temp 97.9 F (36.6 C) (Oral)   Resp 16   Ht 5\' 5"  (1.651 m)   Wt 70.3 kg (155 lb)   SpO2 100%   BMI 25.79 kg/m  CONSTITUTIONAL: Alert and oriented and responds appropriately to questions. Well-appearing; well-nourished HEAD: Normocephalic EYES: Conjunctivae clear, pupils appear equal, EOMI ENT: normal nose; moist mucous membranes; No pharyngeal erythema or petechiae, no tonsillar hypertrophy or exudate, no uvular deviation, no unilateral swelling, no trismus or drooling, no muffled voice, normal phonation, no stridor, significant dental decay noted to the remaining 3 lower front teeth, multiple other teeth have been removed, no drainable dental abscess noted, no Ludwig's angina, tongue sits flat in the bottom of the mouth, no angioedema, no facial erythema or warmth, no facial swelling; no pain with movement of the neck. NECK: Supple, no meningismus, no nuchal rigidity, no LAD  CARD: RRR; S1 and S2 appreciated; no murmurs, no clicks, no rubs, no gallops RESP:  Normal chest excursion without splinting or tachypnea; breath sounds clear and equal bilaterally; no wheezes, no rhonchi, no rales, no hypoxia or respiratory distress, speaking full sentences ABD/GI: Normal bowel sounds; non-distended; soft, non-tender, no rebound, no guarding, no peritoneal signs, no hepatosplenomegaly BACK:  The back appears normal and is tender over the lower lumbar paraspinal muscles without redness, warmth, ecchymosis, lesions or other deformity, no midline spinal tenderness or step-off or deformity, there is no CVA tenderness EXT: Normal ROM in all joints; non-tender to palpation; no edema; normal capillary refill; no cyanosis, no calf tenderness or swelling    SKIN: Normal color for age and race; warm; no rash NEURO: Moves all extremities equally, sensation to light touch intact diffusely, normal speech, no saddle anesthesia, normal gait PSYCH: The  patient's mood and manner are appropriate. Grooming and personal hygiene are appropriate.  MEDICAL DECISION MAKING: Patient here complains of back pain.  Seems to musculoskeletal in nature.  Urine was obtained in triage which shows no sign of infection.  I do not think she has kidney stones.  Doubt dissection.  Nothing to suggest cauda equina, spinal stenosis, epidural abscess or hematoma, discitis or osteomyelitis.  I do not feel she needs emergent imaging of her back.  No midline tenderness or injury to suggest fracture.  As for her dental pain, I suspect this is from dental caries.  Will discharge with clindamycin and outpatient dental resources.  No abscess or Ludwig angina at this time.  Will discharge with short course of Vicodin for pain control as well as ibuprofen.   At this time, I do not feel there is any life-threatening condition present. I have reviewed and discussed all results (EKG, imaging, lab, urine as appropriate) and exam findings with patient/family. I have reviewed nursing notes and appropriate previous records.  I  feel the patient is safe to be discharged home without further emergent workup and can continue workup as an outpatient as needed. Discussed usual and customary return precautions. Patient/family verbalize understanding and are comfortable with this plan.  Outpatient follow-up has been provided if needed. All questions have been answered.     Ward, Delice Bison, DO 08/08/17 [phone]

## 2017-08-09 ENCOUNTER — Ambulatory Visit: Payer: PPO | Admitting: Physical Therapy

## 2017-08-09 DIAGNOSIS — M25561 Pain in right knee: Secondary | ICD-10-CM | POA: Diagnosis not present

## 2017-08-09 DIAGNOSIS — M6281 Muscle weakness (generalized): Secondary | ICD-10-CM

## 2017-08-09 DIAGNOSIS — M25571 Pain in right ankle and joints of right foot: Secondary | ICD-10-CM

## 2017-08-09 DIAGNOSIS — G8929 Other chronic pain: Secondary | ICD-10-CM

## 2017-08-09 DIAGNOSIS — R262 Difficulty in walking, not elsewhere classified: Secondary | ICD-10-CM

## 2017-08-09 NOTE — Patient Instructions (Addendum)
Knee High   Holding stable object, raise knee to hip level, then lower knee. Repeat with other knee. Complete __10_ repetitions. Do __2__ sessions per day.  ABDUCTION: Standing (Active)   Stand, feet flat. Lift right leg out to side. Use _3__ lbs. Complete __10_ repetitions. Perform __2_ sessions per day.  ADDUCTION: Standing - Stable (Active)   Stand, right leg out to side as far as possible. Draw leg in across midline. Use _3__ lbs. Complete 10_ repetitions. Perform _2__ sessions per day.       EXTENSION: Standing (Active)  Stand, both feet flat. Draw right leg behind body as far as possible. Use 3___ lbs. Complete 10 repetitions. Perform __2_ sessions per day.  Copyright  VHI. All rights reserved.   Bridge    Lie back, legs bent. Inhale, pressing hips up. Keeping ribs in, lengthen lower back. Exhale, rolling down along spine from top. Repeat __15__ times. Do __1__ sessions per day.  http://pm.exer.us/55   Copyright  VHI. All rights reserved.     Copyright  VHI. All rights reserved.  HIP: Flexion / KNEE: Extension, Straight Leg Raise   Raise leg, keeping knee straight. Perform slowly. ___ reps per set, ___ sets per day, ___ days per week you can use 3 lb       Copyright  VHI. All rights reserved.     Raise leg until knee is straight. __2_ reps per set, _2__ sets per day, __7_ days per week you can use weights too   http://gt2.exer.us/365   Copyright  VHI. All rights reserved.  Quad Set   Slowly tighten muscles on thigh of straight leg while counting out loud to __5__. Repeat with other leg. Repeat _30___ times.  When you watch TV do when commercials are on  SINGLE LIMB STANCE    Stance: single leg on floor. Raise leg. Hold _39__ seconds. Repeat with other leg. _5__ reps per set, __1_ sets per day, __7_ days per week You may hold onto counter with 2 fingers and lift to test how many seconds you can stand  Copyright  VHI. All rights  reserved.   WALKING  Walking is a great form of exercise to increase your strength, endurance and overall fitness.  A walking program can help you start slowly and gradually build endurance as you go.  Everyone's ability is different, so each person's starting point will be different.  You do not have to follow them exactly.  The are just samples. You should simply find out what's right for you and stick to that program.   In the beginning, you'll start off walking 2-3 times a day for short distances.  As you get stronger, you'll be walking further at just 1-2 times per day.  A. You Can Walk For A Certain Length Of Time Each Day    Walk 5 minutes 3 times per day.  Increase 2 minutes every 2 days (3 times per day).  Work up to 25-30 minutes (1-2 times per day).   Example:   Day 1-2 5 minutes 3 times per day   Day 7-8 12 minutes 2-3 times per day   Day 13-14 25 minutes 1-2 times per day  B. You Can Walk For a Certain Distance Each Day     Distance can be substituted for time.    Example:   3 trips to mailbox (at road)   3 trips to corner of block   3 trips around the block  C. Go to local  high school and use the track.    Walk for distance ____ around track  Or time ____ minutes  D. Walk _x___ Jog ____ Run ___  Please only do the exercises that your therapist has initialed and dated      Voncille Lo, PT Certified Exercise Expert for the Aging Adult  08/09/17 1:49 PM Phone: [phone] Fax: [phone]

## 2017-08-09 NOTE — Therapy (Addendum)
[place], [place], [address] Phone: [phone]   Fax:  [phone]  Physical Therapy Treatment  Patient Details  Name: [name] MRN: [id number] Date of Birth: [birth year] Referring Provider: currence, Maia Breslow PA Clent Demark   Encounter Date: 08/09/2017  PT End of Session - 08/09/17 1312    Visit Number  8    Number of Visits  16    Date for PT Re-Evaluation  08/28/17    Authorization Type  Medicare     PT Start Time  1325    PT Stop Time  1414    PT Time Calculation (min)  49 min    Activity Tolerance  Patient tolerated treatment well    Behavior During Therapy  [facility] for tasks assessed/performed       Past Medical History:  Diagnosis Date  . Abnormal EKG 2012   hospitalized for T wave inversion in lateral leads with MSK chest pains, normal ECHO, negative trops, no cardiology consult. recent EKG 7/15 stable T wave inversions.   . 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 Arthritis 2000  . Heart murmur   . Hyperlipidemia   . Hypertension 2010  . Meniscus tear    L KNEE    Past Surgical History:  Procedure Laterality Date  . ABDOMINAL SURGERY  2010   for bowel blockage  . KNEE ARTHROSCOPY Left 09/25/2013   Procedure: LEFT KNEE ARTHROSCOPY WITH PARTIAL MEDIAL AND LATERAL  MENISCECTOMY/DEBRIDEMENT/MICRO FRACTURE MEDIAL FEMORAL CONDYLE, REMOVAL OF OSTEOCONDRAL FRAGMENT;  Surgeon: Johnn Hai, MD;  Location: [facility] OR[facility];  Service: Orthopedics;  Laterality: Left;  . KNEE SURGERY Right 1999  . TEE WITHOUT CARDIOVERSION N/A 10/04/2015   Procedure: TRANSESOPHAGEAL ECHOCARDIOGRAM (TEE);  Surgeon: Sueanne Margarita, MD;  Location: [facility] ENDOSCOPY;  Service: Cardiovascular;  Laterality: N/A;  . TUBAL LIGATION  1979    There were no vitals filed for this visit.  Subjective Assessment - 08/09/17 1329    Subjective  I went to the ED because of back pain and my sides.     Currently in Pain?  No/denies     Pain Score  0-No pain    Pain Location  Knee    Pain Orientation  Right         [facility] PT Assessment - 08/09/17 1333      AROM   Right Knee Extension  0    Right Knee Flexion  133                   [facility] Adult PT Treatment/Exercise - 08/09/17 1337      Self-Care   Self-Care  Other Self-Care Comments    Other Self-Care Comments   educated on walking program and RPE and walk/talk test with handouts      Knee/Hip Exercises: Standing   Terminal Knee Extension Limitations  10 x 2 sets pressing ball into wall    Forward Step Up  Right;1 set;10 reps;Hand Hold: 2;Step Height: 6"    Functional Squat  10 reps    Functional Squat Limitations  also performed low back stretch with sink x 3 and holding for 15-20 sec x 3    Other Standing Knee Exercises  4 way SLR at counter 10 x r and l with flexion/abd and extension at hip with 3lb wt.      Knee/Hip Exercises: Seated   Long Arc Quad  1 set;10 reps;Right;Strengthening    Sit to [employer]  10 reps  without UE support.  improved      Knee/Hip Exercises: Supine   Quad Sets  20 reps;Right;Strengthening    Bridges  Strengthening;1 set;10 reps does not come full ROM    Straight Leg Raises  1 set;Right;Left;10 reps 3 lb wt. slowly raise and layer to 3      Ankle Exercises: Seated   Heel Raises  10 reps    Toe Raise  10 reps      Ankle Exercises: Standing   SLS  two finger balance on counter as needed.  trying to R and l SLS over 4 minutes             PT Education - 08/09/17 1349    Education provided  Yes    Education Details  added counter 4 way standing and knee strength  and SLS balance to HEP added info/edcuation on walking program    Person(s) Educated  Patient    Methods  Explanation;Demonstration;Tactile cues;Verbal cues;Handout    Comprehension  Verbalized understanding;Returned demonstration       PT Short Term Goals - 08/02/17 1337      PT SHORT TERM GOAL #1   Title  "Independent with initial HEP     Baseline  Pt able to perform intial HEP independently  moving on to advanced today    Time  3    Period  Weeks    Status  Achieved      PT SHORT TERM GOAL #2   Title  "Pt will ambulate with LRAD and without limp    Baseline  pt uses cane without limo today    Time  3    Period  Weeks    Status  Achieved      PT SHORT TERM GOAL #3   Title  Pt will gain right ankle dorsiflexion at least 5 degrees    Baseline  7 degrees    Time  3    Status  Achieved        PT Long Term Goals - 08/09/17 1330      PT LONG TERM GOAL #1   Title  "Pt will be independent with advanced HEP.     Baseline  Pt 4 way SLR, SLS , hip and knee strength standing today HEP    Time  8    Period  Weeks    Status  On-going      PT LONG TERM GOAL #2   Title  "FOTO will improve from   68% limitation  to  49% limtation   indicating improved functional mobility     Time  8    Period  Weeks    Status  Unable to assess      PT LONG TERM GOAL #3   Title  "Pt will improve her R knee flexion to  >/= 130degrees and extension to </= 2 degrees with </= 2/10 pain for a more functional and efficient gait pattern    Baseline  right knee is 130 with  no pain    Time  8    Period  Weeks    Status  Achieved      PT LONG TERM GOAL #4   Title  Pt will be able to negotiate steps with step over  step techinique and minimal use of hands in order to climb 10 steps in home with 75% greater ease    Baseline  working on stepping and strength on steps    Time  8  Period  Weeks    Status  On-going      PT LONG TERM GOAL #5   Title  Pt will be able to wear normal tennis shoes and be able to vacuum house without exacerbating pain in right LE greater than 2/10    Baseline  Pt wearing normal tennis shoes and pain 0/10    Time  8    Period  Weeks    Status  Achieved            Plan - 08/09/17 1423    Clinical Impression Statement  LTG # 3 and #5 are achieved.  AROM of RIght knee 0 ext and 133 flexion.  Pt with no pain  and able to wear regular tennis shoes.  Pt was in ED with back pain this past week since last PT visit. Pt today with no compaints of pain even in knee and feels like she is doing better overall.  Pt will reinforce HEP next 2 visits and DC    Rehab Potential  Good    PT Frequency  2x / week    PT Duration  8 weeks    PT Treatment/Interventions  ADLs/Self Care Home Management;Cryotherapy;Electrical Stimulation;Iontophoresis 4mg /ml Dexamethasone;Moist Heat;Ultrasound;Taping;Dry needling;Passive range of motion;Patient/family education;Neuromuscular re-education;Functional mobility training;Stair training;Gait training;Therapeutic activities;Therapeutic exercise    PT Next Visit Plan   Will DC in two visit. Do FOTO review HEP for standing.  continue proprioception and ankle/knee/hip strength. , wt shifting for right ankle. Continue to  work on single limb stance and heel raise at counter. and terminal knee control.  check swelling    PT Home Exercise Plan  ankle AROM, resisted knee flex/ext with red t band sit to stand, gastroc stretch heel raises,, SLR and Quad set and forward knee step ups 4 way SLR  SLS balance    Consulted and Agree with Plan of Care  Patient       Patient will benefit from skilled therapeutic intervention in order to improve the following deficits and impairments:  Difficulty walking, Pain, Increased muscle spasms, Decreased strength, Decreased range of motion, Decreased mobility, Postural dysfunction, Improper body mechanics  Visit Diagnosis: Chronic pain of right knee  Pain in right ankle and joints of right foot  Difficulty in walking, not elsewhere classified  Muscle weakness (generalized)     Problem List Patient Active Problem List   Diagnosis Date Noted  . 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 HLD (hyperlipidemia)   . Aortic atherosclerosis ([place])   . Right-sided low back pain without sciatica 07/08/2015  . 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 Bilateral leg pain 06/14/2015  . Seasonal allergies 03/09/2015  . Hypopigmentation 03/09/2015  . Gingivitis 11/10/2014  . Osteoarthritis of left knee 07/08/2014  . DJD (degenerative joint disease), lumbar 07/08/2014  . Osteoarthritis of right knee 07/08/2014  . Foot swelling 06/11/2014  . Pain due to onychomycosis of toenail 06/11/2014  . Borborygmi 06/11/2014  . Allergic rhinoconjunctivitis of both eyes 06/11/2014  . Nearsightedness 04/27/2014  . Abnormal EKG   . Hypertension 12/11/2013   Voncille Lo, PT Certified Exercise Expert for the Aging Adult  08/09/17 2:34 PM Phone: [phone] Fax: [facility] [address] [place], [place], [address] Phone: [phone]   Fax:  [phone]  Name: [name] MRN: [id number] Date of Birth: 10/26/1950

## 2017-08-13 ENCOUNTER — Other Ambulatory Visit: Payer: Self-pay

## 2017-08-13 NOTE — Patient Outreach (Signed)
[name]) Care Management  08/13/2017  [name] 1951-02-01 [id number]   TELEPHONE SCREENING Referral date: 08/09/17 Referral source: [facility] utilization management Referral reason: member needs assistance/ prescription eye ware assistance.  Insurance: [employer] advantage Attempt #1  Telephone call to patient regarding utilization management referral. Unable to reach patient. HIPAA compliant voice message left with call back phone number.   PLAN: RNCM will attempt 2nd telephone call to patient within 4 business days.  RNCM will send outreach letter to attempt contact.   Quinn Plowman RN,BSN,CCM [facility] Telephonic  [phone]

## 2017-08-14 ENCOUNTER — Other Ambulatory Visit: Payer: Self-pay | Admitting: Licensed Clinical Social Worker

## 2017-08-14 ENCOUNTER — Other Ambulatory Visit: Payer: Self-pay

## 2017-08-14 ENCOUNTER — Encounter: Payer: Self-pay | Admitting: Physical Therapy

## 2017-08-14 ENCOUNTER — Ambulatory Visit: Payer: PPO | Admitting: Physical Therapy

## 2017-08-14 DIAGNOSIS — M6281 Muscle weakness (generalized): Secondary | ICD-10-CM

## 2017-08-14 DIAGNOSIS — M25561 Pain in right knee: Principal | ICD-10-CM

## 2017-08-14 DIAGNOSIS — G8929 Other chronic pain: Secondary | ICD-10-CM

## 2017-08-14 DIAGNOSIS — M25571 Pain in right ankle and joints of right foot: Secondary | ICD-10-CM

## 2017-08-14 DIAGNOSIS — R262 Difficulty in walking, not elsewhere classified: Secondary | ICD-10-CM

## 2017-08-14 NOTE — Therapy (Signed)
[place], [place], [address] Phone: [phone]   Fax:  [phone]  Physical Therapy Treatment  Patient Details  Name: [name] MRN: [id number] Date of Birth: 1950-08-13 Referring Provider: currence, Maia Breslow PA Clent Demark   Encounter Date: 08/14/2017  PT End of Session - 08/14/17 1805    Visit Number  9    Number of Visits  16    PT Start Time  1330    PT Stop Time  1345    PT Time Calculation (min)  15 min    Activity Tolerance  Patient tolerated treatment well    Behavior During Therapy  [facility] for tasks assessed/performed       Past Medical History:  Diagnosis Date  . Abnormal EKG 2012   hospitalized for T wave inversion in lateral leads with MSK chest pains, normal ECHO, negative trops, no cardiology consult. recent EKG 7/15 stable T wave inversions.   . 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 Arthritis 2000  . Heart murmur   . Hyperlipidemia   . Hypertension 2010  . Meniscus tear    L KNEE    Past Surgical History:  Procedure Laterality Date  . ABDOMINAL SURGERY  2010   for bowel blockage  . KNEE ARTHROSCOPY Left 09/25/2013   Procedure: LEFT KNEE ARTHROSCOPY WITH PARTIAL MEDIAL AND LATERAL  MENISCECTOMY/DEBRIDEMENT/MICRO FRACTURE MEDIAL FEMORAL CONDYLE, REMOVAL OF OSTEOCONDRAL FRAGMENT;  Surgeon: Johnn Hai, MD;  Location: [facility];  Service: Orthopedics;  Laterality: Left;  . KNEE SURGERY Right 1999  . TEE WITHOUT CARDIOVERSION N/A 10/04/2015   Procedure: TRANSESOPHAGEAL ECHOCARDIOGRAM (TEE);  Surgeon: Sueanne Margarita, MD;  Location: [facility] ENDOSCOPY;  Service: Cardiovascular;  Laterality: N/A;  . TUBAL LIGATION  1979    There were no vitals filed for this visit.  Subjective Assessment - 08/14/17 1341    Subjective  back 8/10,  hip left 8/10 right knee  5/10 .  She is doing the exercises with her grandbaby.  it does not hurt to get in and out of the car. My legs are swollen  today.  Patient is now able to vacume.     Currently in Pain?  Yes    Pain Score  5     Pain Location  Knee    Pain Orientation  Right    Pain Descriptors / Indicators  Aching    Pain Type  Acute pain    Pain Frequency  Intermittent    Aggravating Factors   curled up in bed with knees bent    Pain Relieving Factors  stretching,  change of position    Multiple Pain Sites  No    Pain Location  Teeth                       [facility] Adult PT Treatment/Exercise - 08/14/17 0001      Knee/Hip Exercises: Standing   Forward Step Up  Right;1 set;10 reps;Hand Hold: 2;Step Height: 6" and left    Functional Squat  10 reps good technique    SLS  right 6 seconds, 6/10 pain,   left 21 seconds    Other Standing Knee Exercises  verbally revieded due to lack of time      Knee/Hip Exercises: Seated   Long Arc Quad  1 set;10 reps;Right;Strengthening    Hamstring Curl  1 set    Hamstring Limitations  green    Sit to Sand  10  reps no hands      Knee/Hip Exercises: Supine   Quad Sets  20 reps;Right;Strengthening    Bridges  Strengthening;10 reps    Bridges with Diona Foley Squeeze  1 set;10 reps    Straight Leg Raises  1 set;Right;Left;10 reps             PT Education - 08/14/17 1805    Education provided  Yes    Education Details  exercise form and ways to work exercise into day    Person(s) Educated  Patient    Methods  Explanation;Demonstration    Comprehension  Verbalized understanding;Returned demonstration       PT Short Term Goals - 08/02/17 1337      PT SHORT TERM GOAL #1   Title  "Independent with initial HEP    Baseline  Pt able to perform intial HEP independently  moving on to advanced today    Time  3    Period  Weeks    Status  Achieved      PT SHORT TERM GOAL #2   Title  "Pt will ambulate with LRAD and without limp    Baseline  pt uses cane without limo today    Time  3    Period  Weeks    Status  Achieved      PT SHORT TERM GOAL #3   Title  Pt will gain  right ankle dorsiflexion at least 5 degrees    Baseline  7 degrees    Time  3    Status  Achieved        PT Long Term Goals - 08/14/17 1808      PT LONG TERM GOAL #1   Title  "Pt will be independent with advanced HEP.     Baseline  minor cues    Time  8    Status  On-going      PT LONG TERM GOAL #2   Title  "FOTO will improve from   68% limitation  to  49% limtation   indicating improved functional mobility     Baseline  50 % limitation    Time  8    Period  Weeks    Status  Partially Met      PT LONG TERM GOAL #3   Title  "Pt will improve her R knee flexion to  >/= 130degrees and extension to </= 2 degrees with </= 2/10 pain for a more functional and efficient gait pattern    Time  8    Period  Weeks    Status  Achieved      PT LONG TERM GOAL #4   Title  Pt will be able to negotiate steps with step over  step techinique and minimal use of hands in order to climb 10 steps in home with 75% greater ease    Baseline  working on stepping and strength on steps    Time  8    Period  Weeks    Status  On-going      PT LONG TERM GOAL #5   Title  Pt will be able to wear normal tennis shoes and be able to vacuum house without exacerbating pain in right LE greater than 2/10    Time  8    Period  Weeks    Status  Achieved            Plan - 08/14/17 1810    Clinical Impression Statement  LTG#2 partly met.  FOTO 50% limitation.  Function and strength improving overall.  She is doing her HEP.  She should be ready for D? per PT paln.  No pain increased at end of session.  She declined the need for modalities.     PT Next Visit Plan   Will DC next visit. D/C FOTO ( review HEP for standing.  continue proprioception and ankle/knee/hip strength. , wt shifting for right ankle. Continue to  work on single limb stance and heel raise at counter. and terminal knee control.  check swelling    PT Home Exercise Plan  ankle AROM, resisted knee flex/ext with red t band sit to stand, gastroc  stretch heel raises,, SLR and Quad set and forward knee step ups 4 way SLR  SLS balance    Consulted and Agree with Plan of Care  Patient       Patient will benefit from skilled therapeutic intervention in order to improve the following deficits and impairments:     Visit Diagnosis: Chronic pain of right knee  Pain in right ankle and joints of right foot  Difficulty in walking, not elsewhere classified  Muscle weakness (generalized)     Problem List Patient Active Problem List   Diagnosis Date Noted  . 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 HLD (hyperlipidemia)   . Aortic atherosclerosis ([place])   . Right-sided low back pain without sciatica 07/08/2015  . 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 Bilateral leg pain 06/14/2015  . Seasonal allergies 03/09/2015  . Hypopigmentation 03/09/2015  . Gingivitis 11/10/2014  . Osteoarthritis of left knee 07/08/2014  . DJD (degenerative joint disease), lumbar 07/08/2014  . Osteoarthritis of right knee 07/08/2014  . Foot swelling 06/11/2014  . Pain due to onychomycosis of toenail 06/11/2014  . Borborygmi 06/11/2014  . Allergic rhinoconjunctivitis of both eyes 06/11/2014  . Nearsightedness 04/27/2014  . Abnormal EKG   . Hypertension 12/11/2013    Daneya Hartgrove PTA 08/14/2017, 6:12 PM  [facility] [address] [place], [place], [address] Phone: [phone]   Fax:  [phone]  Name: [name] MRN: [id number] Date of Birth: [birth year]

## 2017-08-14 NOTE — Patient Outreach (Signed)
[place] [facility]) Care Management  08/14/2017  [name] October 27, 1950 [id number]  TELEPHONE SCREENING Referral date: 08/09/17 Referral source: utilization management Referral reason: patient needs assistance with prescription eyeware Insurance: Health team advantage.   Telephone call to patient regarding utilization management referral. HIPAA verified with patient. Explained reason for call. Patient states she needs assistance with glasses. RNCM discussed and offered [facility] care management services. Patient verbally agreed to social work follow up.  RNCM also advised patient if she had questions regarding her health team advantage plan/ coverage she can call the number on her insurance card and speak with the concierge services. Patient verbalized understanding.  Patient states last visit with her primary care provider was December 2016.  Patient states she is changing primary care providers. States she has spoken with the new doctors office and they are going to schedule her for a June 2019 appointment.  Patient states she was recently in the emergency room due to a tooth ache and pain in both sides. Resolved at this time. Patient states she needs to go to a dentist. RNCM advised patient to call the health team advantage number on her health card. Also advised patient to discuss need for dental services with [facility] social worker. Patient verbalized understanding.  Patient states she broke her right leg in December 2019. States she is still having some swelling in her right leg and she uses a cane to ambulate. Patient states she is still receiving physical therapy for this.    PLAN: RNCM will refer patient to social worker  Quinn Plowman RN,BSN,CCM [facility] Telephonic  [phone]

## 2017-08-14 NOTE — Patient Outreach (Signed)
[facility]) Care Management  08/14/2017  Samariya Rockhold 02/15/1951 [id number]  [facility] CSW received new referral on 08/14/17 that stated patient was interesting in social work assistance. [facility] CSW completed screening through chart review. Patient is wanting community resource assistance with navigating her health care insurance for eyeglass coverage. Patient is also in need of dental resources as well. Patient was encouraged to contact [employer] concierge service by [facility] RNCM.[facility] CSW will refer case to [facility] who will outreach to patient and address these community resource needs. [facility] CSW will sign off at this time.  Eula Fried, BSW, MSW, [email or web address] Phone: [phone] Fax: [phone]

## 2017-08-16 ENCOUNTER — Other Ambulatory Visit: Payer: Self-pay

## 2017-08-16 ENCOUNTER — Ambulatory Visit: Payer: Self-pay

## 2017-08-16 ENCOUNTER — Ambulatory Visit: Payer: PPO | Admitting: Physical Therapy

## 2017-08-16 DIAGNOSIS — R262 Difficulty in walking, not elsewhere classified: Secondary | ICD-10-CM

## 2017-08-16 DIAGNOSIS — M25571 Pain in right ankle and joints of right foot: Secondary | ICD-10-CM

## 2017-08-16 DIAGNOSIS — M25561 Pain in right knee: Secondary | ICD-10-CM | POA: Diagnosis not present

## 2017-08-16 DIAGNOSIS — M6281 Muscle weakness (generalized): Secondary | ICD-10-CM

## 2017-08-16 DIAGNOSIS — G8929 Other chronic pain: Secondary | ICD-10-CM

## 2017-08-16 NOTE — Therapy (Signed)
[place], [place], [address] Phone: [phone]   Fax:  [phone]  Physical Therapy Treatment  Patient Details  Name: [name] MRN: [id number] Date of Birth: 1950/12/25 Referring Provider: currence, Maia Breslow PA Clent Demark   Encounter Date: 08/16/2017  PT End of Session - 08/16/17 1332    Visit Number  10    Number of Visits  16    Authorization Type  Medicare     PT Start Time  1330    PT Stop Time  1415    PT Time Calculation (min)  45 min    Activity Tolerance  Patient tolerated treatment well    Behavior During Therapy  [facility] for tasks assessed/performed       Past Medical History:  Diagnosis Date  . Abnormal EKG 2012   hospitalized for T wave inversion in lateral leads with MSK chest pains, normal ECHO, negative trops, no cardiology consult. recent EKG 7/15 stable T wave inversions.   . 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 Arthritis 2000  . Heart murmur   . Hyperlipidemia   . Hypertension 2010  . Meniscus tear    L KNEE    Past Surgical History:  Procedure Laterality Date  . ABDOMINAL SURGERY  2010   for bowel blockage  . KNEE ARTHROSCOPY Left 09/25/2013   Procedure: LEFT KNEE ARTHROSCOPY WITH PARTIAL MEDIAL AND LATERAL  MENISCECTOMY/DEBRIDEMENT/MICRO FRACTURE MEDIAL FEMORAL CONDYLE, REMOVAL OF OSTEOCONDRAL FRAGMENT;  Surgeon: Johnn Hai, MD;  Location: [facility];  Service: Orthopedics;  Laterality: Left;  . KNEE SURGERY Right 1999  . TEE WITHOUT CARDIOVERSION N/A 10/04/2015   Procedure: TRANSESOPHAGEAL ECHOCARDIOGRAM (TEE);  Surgeon: Sueanne Margarita, MD;  Location: [facility] ENDOSCOPY;  Service: Cardiovascular;  Laterality: N/A;  . TUBAL LIGATION  1979    There were no vitals filed for this visit.  Subjective Assessment - 08/16/17 1333    Subjective  pt enters clinic with cane and complains of knee pain 3/10 and swelling at ankle after playing with grand child and walking.   Pt verbalizes that she believes she can do exercises on her own and is ready for DC     Limitations  Standing;Walking    How long can you sit comfortably?  1-2 hours    How long can you stand comfortably?  30-45 minutes    How long can you walk comfortably?  30- 45 minutes    Diagnostic tests  x ray    Patient Stated Goals  get strength back, walk without a cane, walk up and down steps     Currently in Pain?  Yes    Pain Score  3     Pain Location  Knee    Pain Orientation  Right    Pain Descriptors / Indicators  Aching    Pain Type  Acute pain    Pain Onset  More than a month ago    Pain Frequency  Intermittent         [facility] PT Assessment - 08/16/17 1334      Observation/Other Assessments   Focus on Therapeutic Outcomes (FOTO)   FOTO intake 50 %, predicted 49% limitaiton is 50%      Single Leg Stance   Comments  right 5 sec, left 30 sec      AROM   Right Knee Extension  0    Right Knee Flexion  133    Left Knee Flexion  136  Right Ankle Dorsiflexion  8 Lead with toe extensor    Right Ankle Plantar Flexion  50    Right Ankle Inversion  30    Right Ankle Eversion  17    Left Ankle Dorsiflexion  10    Left Ankle Plantar Flexion  54    Left Ankle Inversion  36    Left Ankle Eversion  25      Strength   Overall Strength  Deficits    Right Hip Flexion  4+/5    Right Hip Extension  4+/5    Right Hip ABduction  4/5    Left Hip Flexion  5/5    Left Hip Extension  5/5    Left Hip ABduction  4+/5    Right Knee Flexion  4/5    Right Knee Extension  4/5    Left Knee Flexion  5/5    Left Knee Extension  5/5    Right Ankle Dorsiflexion  4/5    Right Ankle Plantar Flexion  4/5    Left Ankle Dorsiflexion  5/5    Left Ankle Plantar Flexion  5/5                   [facility] Adult PT Treatment/Exercise - 08/16/17 1342      Self-Care   Self-Care  Other Self-Care Comments    Other Self-Care Comments   performed floor to mat and mat to floor transfer for safety and  fall prevention training purchase compression socks for ankle swelling      Knee/Hip Exercises: Standing   Terminal Knee Extension Limitations  10 x 2 sets pressing ball into wall    Forward Step Up  Right;1 set;10 reps;Hand Hold: 2;Step Height: 6"    Functional Squat  10 reps good technique    Functional Squat Limitations  also performed low back stretch with sink x 3 and holding for 15-20 sec x 3 for review for back irritation    SLS  right 15 sec, left 33 sec    Other Standing Knee Exercises  4 way SLR at counter 10 x r and l with flexion/abd and extension at hip with 3lb wt.      Knee/Hip Exercises: Seated   Long Arc Quad  1 set;10 reps;Right;Strengthening    Hamstring Curl  1 set    Hamstring Limitations  green    Sit to Sand  10 reps with 10 lb goblet squat      Knee/Hip Exercises: Supine   Bridges  Strengthening;10 reps told not to do if back is irritated    Bridges with [employer]  1 set;10 reps      Manual Therapy   Joint Mobilization  right joint mobs talocrural and sub talar grade 3/4, A/P talocrural with DF x 10      Ankle Exercises: Seated   Heel Raises  10 reps    Toe Raise  10 reps               PT Short Term Goals - 08/02/17 1337      PT SHORT TERM GOAL #1   Title  "Independent with initial HEP    Baseline  Pt able to perform intial HEP independently  moving on to advanced today    Time  3    Period  Weeks    Status  Achieved      PT SHORT TERM GOAL #2   Title  "Pt will ambulate with LRAD and without limp  Baseline  pt uses cane without limo today    Time  3    Period  Weeks    Status  Achieved      PT SHORT TERM GOAL #3   Title  Pt will gain right ankle dorsiflexion at least 5 degrees    Baseline  7 degrees    Time  3    Status  Achieved        PT Long Term Goals - 08/16/17 1359      PT LONG TERM GOAL #1   Title  "Pt will be independent with advanced HEP.     Baseline  minor cues but pt insists she knows exercises and does them at  home and she plays with grandbabys    Time  8    Period  Weeks    Status  Achieved      PT LONG TERM GOAL #2   Title  "FOTO will improve from   68% limitation  to  49% limtation   indicating improved functional mobility     Baseline  50 % limitation    Time  8    Status  Partially Met      PT LONG TERM GOAL #3   Title  "Pt will improve her R knee flexion to  >/= 130degrees and extension to </= 2 degrees with </= 2/10 pain for a more functional and efficient gait pattern    Baseline  right knee is 133 with  no pain    Time  8    Period  Weeks    Status  Achieved      PT LONG TERM GOAL #4   Title  Pt will be able to negotiate steps with step over  step techinique and minimal use of hands in order to climb 10 steps in home with 75% greater ease    Baseline  Pt has 8 steps in home and she performs daily step exercises     Time  8    Period  Weeks    Status  Achieved      PT LONG TERM GOAL #5   Title  Pt will be able to wear normal tennis shoes and be able to vacuum house without exacerbating pain in right LE greater than 2/10    Baseline  Pt wearing normal tennis shoes and pain 3/10 and reduces to 0/10 with movement    Time  8    Period  Weeks    Status  Achieved            Plan - 08/16/17 1423    Clinical Impression Statement  Pt enters clinic with 3/10 pain and cane today.  Pt playing with grandbabies.  Pt performed stand to mat and mat to standing transfer indepenedently.  Pt completed all goals with met and one with partially met.  Pt increased strength in right ankle and achieved goals for right knee.  Pt still has valgus knee and some swelling and was advised to purchase compression socks to reduce swelling.,  If swelling continues please see doctor.  Pt has steps in home and she negotiates them every day and utilizes them for exercises with HEP.  Pt is able to perform stand to mat and mat to standing transfer independently. Pt is pleased with current progress and wishe to  DC at this time  Pt needs to continue pursuinig community wellness to increase overall strength.  Pt is now able to perform goblet squats with  10 lb wt. and also is able to stand on right LE of 15 sec SLS.  Will DC with all achieved.  Pt was 3/10 at beginning of  RX and ended with 0/10    Rehab Potential  Good    PT Frequency  2x / week    PT Duration  8 weeks    PT Treatment/Interventions  ADLs/Self Care Home Management;Cryotherapy;Electrical Stimulation;Iontophoresis 70m/ml Dexamethasone;Moist Heat;Ultrasound;Taping;Dry needling;Passive range of motion;Patient/family education;Neuromuscular re-education;Functional mobility training;Stair training;Gait training;Therapeutic activities;Therapeutic exercise    PT Next Visit Plan  DC    PT Home Exercise Plan  ankle AROM, resisted knee flex/ext with red t band sit to stand, gastroc stretch heel raises,, SLR and Quad set and forward knee step ups 4 way SLR  SLS balance    Consulted and Agree with Plan of Care  Patient       Patient will benefit from skilled therapeutic intervention in order to improve the following deficits and impairments:  Difficulty walking, Pain, Increased muscle spasms, Decreased strength, Decreased range of motion, Decreased mobility, Postural dysfunction, Improper body mechanics  Visit Diagnosis: Chronic pain of right knee  Pain in right ankle and joints of right foot  Difficulty in walking, not elsewhere classified  Muscle weakness (generalized)     Problem List Patient Active Problem List   Diagnosis Date Noted  . 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 Renal infarct ([place] 09/30/2015  . HLD (hyperlipidemia)   . Aortic atherosclerosis ([place]   . Right-sided low back pain without sciatica 07/08/2015  . 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 Bilateral leg pain 06/14/2015  . Seasonal allergies 03/09/2015  . Hypopigmentation 03/09/2015  . Gingivitis 11/10/2014  . Osteoarthritis of left knee  07/08/2014  . DJD (degenerative joint disease), lumbar 07/08/2014  . Osteoarthritis of right knee 07/08/2014  . Foot swelling 06/11/2014  . Pain due to onychomycosis of toenail 06/11/2014  . Borborygmi 06/11/2014  . Allergic rhinoconjunctivitis of both eyes 06/11/2014  . Nearsightedness 04/27/2014  . Abnormal EKG   . Hypertension 12/11/2013   LVoncille Lo PT Certified Exercise Expert for the Aging Adult  08/16/17 2:30 PM Phone: [phone]: 3[facility]GMidd[address] [place] [address]Phone: [phone]  Fax:  [phone] Name: Denise AdderleyMRN: [id number] of Birth: 6October 14, 1952  PHYSICAL THERAPY DISCHARGE SUMMARY  Visits from Start of Care: 10  Current functional level related to goals / functional outcomes: As above, able to walk without a cane except when knee/right ankle swells,  Needs to purchase a compression sock   Remaining deficits: As above   Education / Equipment: HEP Plan: Patient agrees to discharge.  Patient goals were partially met. Patient is being discharged due to the patient's request.  ?????    And pt being pleased with current functional progress  LVoncille Lo PT Certified Exercise Expert for the Aging Adult  08/16/17 2:32 PM Phone: [phone]: [phone]

## 2017-08-16 NOTE — Patient Outreach (Signed)
[facility]) Care Management  08/16/2017  [name] [birth year] [id number]   First outreach attempt to patient regarding social work referral received on 08/15/17.  BSW left voicemail message.  Unsuccessful outreach letter mailed.  BSW will make second attempt within 3-4 business days.   Ronn Melena, BSW Social Worker [phone]

## 2017-08-17 ENCOUNTER — Encounter: Payer: Self-pay | Admitting: Gastroenterology

## 2017-08-21 ENCOUNTER — Ambulatory Visit: Payer: Self-pay

## 2017-08-22 ENCOUNTER — Other Ambulatory Visit: Payer: Self-pay

## 2017-08-22 NOTE — Patient Outreach (Signed)
[facility]) Care Management  08/22/2017  [name] [birth year] [id number]   BSW received return phone call from patient on 08/21/17 regarding social work referral. Patient reported that she contacted [employer] regarding dental and vision benefits.  Patient reported that she is not able to afford the out of pocket expense for the dental work that she needs.  Patient requested dental resources.  BSW mailed resource options to patient on 08/22/17.  BSW will follow up with patient within the next two weeks to ensure receipt of resources and to determine if patient has made contact with any of them.   Ronn Melena, BSW Social Worker [phone]

## 2017-08-23 ENCOUNTER — Other Ambulatory Visit: Payer: Self-pay

## 2017-08-23 NOTE — Patient Outreach (Signed)
[facility] [facility]) Care Management  08/23/2017  Sanela Evola 25-Oct-1950 [id number]  Patient called and requested that she be contacted at the number [phone].    Ronn Melena, BSW Social Worker [phone]

## 2017-08-24 ENCOUNTER — Ambulatory Visit: Payer: Self-pay

## 2017-09-04 ENCOUNTER — Other Ambulatory Visit: Payer: Self-pay

## 2017-09-04 ENCOUNTER — Ambulatory Visit: Payer: Self-pay

## 2017-09-04 NOTE — Progress Notes (Signed)
Subjective:    Patient ID: [name], female    DOB: [birth year], 67 y.o.   MRN: [id number]   CC: New Patient  HPI: PMHx: Past Medical History:  Diagnosis Date  . Abnormal EKG 2012   hospitalized for T wave inversion in lateral leads with MSK chest pains, normal ECHO, negative trops, no cardiology consult. recent EKG 7/15 stable T wave inversions.   . 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 Arthritis 2000  . Arthritis 2011  . Heart murmur   . Hyperlipidemia 2011  . Hypertension 2011  . Meniscus tear 2000   L KNEE  Aortic valve vegetation: Patient hospitalized in 09/2015 and had TEE which showed "moderated dilated left atrium, small mobile thin filamentous density on ventricular side of aortic valve consistent with vegetation. 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 Blood cultures were drawn at that time. Patient had cardiology consultation at that time and was recommended for anticoagulation and to consider surgery if having recurrent events. Patient was on warfarin for anticoagulation for 6 months and then transitioned to aspirin 81mg  indefinitely.   Surgical Hx: Past Surgical History:  Procedure Laterality Date  . ABDOMINAL SURGERY  2010   for bowel blockage  . COLON SURGERY  2011  . KNEE ARTHROSCOPY Left 09/25/2013   Procedure: LEFT KNEE ARTHROSCOPY WITH PARTIAL MEDIAL AND LATERAL  MENISCECTOMY/DEBRIDEMENT/MICRO FRACTURE MEDIAL FEMORAL CONDYLE, REMOVAL OF OSTEOCONDRAL FRAGMENT;  Surgeon: Denise Hai, MD;  Location: [facility];  Service: Orthopedics;  Laterality: Left;  . KNEE SURGERY Right 1999  . TEE WITHOUT CARDIOVERSION N/A 10/04/2015   Procedure: TRANSESOPHAGEAL ECHOCARDIOGRAM (TEE);  Surgeon: Denise Margarita, MD;  Location: [facility] ENDOSCOPY;  Service: Cardiovascular;  Laterality: N/A;  . TUBAL LIGATION  1978     Family Hx: Family History  Problem Relation Age of Onset  . Hypertension Mother   . Heart disease Mother        has a pacemaker   . Alzheimer's  disease Mother   . Hyperlipidemia Mother   . Osteoporosis Mother   . Heart failure Father   . Hypertension Father   . Alzheimer's disease Father   . Hypertension Sister   . Hypertension Sister   . Gout Sister   . Alcohol abuse Son   . Pancreatitis Son   . Cancer Neg Hx   . Colon cancer Neg Hx      Social Hx: Who lives at home: mother 68 y.o.), son (42 y.o)  09/05/2017  Who would speak for you about health care matters: son [name] [phone]) 09/05/2017  Transportation: owns a car 09/05/2017 Important Relationships & Pets: dog (boxer) 09/05/2017  Current Stressors: none 09/05/2017 Work / Education:  Retired, was a cashier/custodian 09/05/2017 Religious / Personal Beliefs: none 09/05/2017 Interests / Fun: goes to football games, bible study, play cards 09/05/2017 Other: quit smoking in 2012, never used alcohol or illicit drug use 2/50/0370   Medications: Amlodipine 10mg  once a day  Aspirin 81mg  daily  Carvedilol 12.5 mg bid Potassium Cl 10mg  once daily  Atorvastatin 40mg  daily  HCTZ 25mg  daily  Multivitamins 7.5 mg daily   ROS: Patient reports no  vision/ hearing changes, anorexia, weight change, fever ,adenopathy, persistant / recurrent hoarseness, swallowing issues, chest pain, edema,persistant / recurrent cough, hemoptysis, dyspnea(rest, exertional, paroxysmal nocturnal), gastrointestinal  bleeding (melena, rectal bleeding), abdominal pain, excessive heart burn, GU symptoms(dysuria, hematuria, pyuria, voiding/incontinence  Issues) syncope, focal weakness, severe memory loss, concerning skin lesions, depression, anxiety, abnormal bruising/bleeding, major joint swelling, breast masses  or abnormal vaginal bleeding.    Preventative Screening Colonoscopy: year 2015 results Three sessile polyps in the sigmoid colon, ascending colon, and at the cecum; polypectomies performed with a cold snare, Three sessile polyps in the sigmoid colon; polypectomies performed with cold forceps,  Semi-pedunculated polyp in the rectum; polypectomy performed using snare cautery, Grade l internal hemorrhoids. Repeat colonoscopy in 3 years if largest polyp adenomatous or if 3 or more others adenomatous; 5 years if 1-2 smaller polyps adenomatous; otherwise 10 years Mammogram: year 2018 results No mammographic evidence of malignancy. Repeat mammogram in 1 year Pap test: year 08/24/15 results NEGATIVE FOR INTRAEPITHELIAL LESIONS OR MALIGNANCY. DEXA: year 11/21/2016 results The BMD measured at Femur Neck Right is 0.826 g/cm2 with a T-score of -1.5. This patient is considered osteopenic according to [facility]) criteria. Lumbar spine was not utilized due to advanced degenerative changes and scoliosis. Tetanus vaccine: year 2017  Pneumonia vaccine: year 2018   Shingles vaccine: never, not interested  Heart stress test: year 2015 results  Echocardiogram: year 09/2015 results LV EF: 65% -   16%, grade 1 diastolic dysfunction  Xrays: year 2018 results  CT/MRI: year 2019 results   Objective:  BP (!) 146/72   Pulse 71   Temp 98.2 F (36.8 C) (Oral)   Wt 170 lb (77.1 kg)   SpO2 98%   BMI 28.29 kg/m  Vitals and nursing note reviewed  General: well nourished, in no acute distress HEENT: normocephalic, TM's visualized bilaterally, PERRL, no scleral icterus or conjunctival pallor, no nasal discharge, moist mucous membranes, without erythema or discharge noted in posterior oropharynx Neck: supple, non-tender, without lymphadenopathy Cardiac: RRR, clear S1 and S2, grade 2/6 systolic murmur, no rubs or gallops Respiratory: clear to auscultation bilaterally, no increased work of breathing Abdomen: soft, nontender, nondistended, no masses or organomegaly. Bowel sounds present Extremities: no edema or cyanosis. Warm, well perfused. 2+ radial and PT pulses bilaterally Skin: warm and dry, no rashes noted Neuro: alert and oriented, no focal deficits   Assessment & Plan:    Aortic  valve vegetation Patient unaware of diagnosis. Per chart review it was seen that patient had workup in the hospital in 09/2015 and had blood cultures and TEE obtained. Patient was seen by cardiology who recommended anticoagulation and to consider surgery if recurrent events. Patient completed 6 months of anticoagulation with warfarin and then transitioned to Aspirin 81mg  daily.   Healthcare maintenance Patient up-to-date on colonoscopy, mammogram, Pap smear, DEXA scan.  Patient also with recent tetanus vaccine and pneumonia vaccine.  Patient does not want shingles vaccine at this time.  Patient aware of when next preventative screenings are due, and is in contact with specialist to schedule these appointments.    Return for chronic medical conditions.   Caroline More, DO, PGY-1

## 2017-09-04 NOTE — Patient Outreach (Addendum)
[facility]) Care Management  09/04/2017  Avanthika Dehnert Mar 17, 1951 [id number]   BSW attempted to contact patient to ensure receipt of dental and vision resources mailed.  [phone] number has been disconnected.   [phone] voicemail box was full.     BSW received return call from patient.  Patient confirmed receipt of resources mailed.  BSW closing case due to no other social work needs being identified at this time.    Ronn Melena, BSW Social Worker [phone]

## 2017-09-05 ENCOUNTER — Ambulatory Visit (INDEPENDENT_AMBULATORY_CARE_PROVIDER_SITE_OTHER): Payer: PPO | Admitting: Family Medicine

## 2017-09-05 ENCOUNTER — Encounter: Payer: Self-pay | Admitting: Family Medicine

## 2017-09-05 ENCOUNTER — Other Ambulatory Visit (INDEPENDENT_AMBULATORY_CARE_PROVIDER_SITE_OTHER): Payer: Self-pay | Admitting: Family Medicine

## 2017-09-05 ENCOUNTER — Other Ambulatory Visit: Payer: Self-pay

## 2017-09-05 DIAGNOSIS — I330 Acute and subacute infective endocarditis: Secondary | ICD-10-CM

## 2017-09-05 DIAGNOSIS — Z79899 Other long term (current) drug therapy: Secondary | ICD-10-CM

## 2017-09-05 DIAGNOSIS — M8580 Other specified disorders of bone density and structure, unspecified site: Secondary | ICD-10-CM | POA: Diagnosis not present

## 2017-09-05 DIAGNOSIS — Z0000 Encounter for general adult medical examination without abnormal findings: Secondary | ICD-10-CM

## 2017-09-05 NOTE — Assessment & Plan Note (Signed)
Patient up-to-date on colonoscopy, mammogram, Pap smear, DEXA scan.  Patient also with recent tetanus vaccine and pneumonia vaccine.  Patient does not want shingles vaccine at this time.  Patient aware of when next preventative screenings are due, and is in contact with specialist to schedule these appointments.

## 2017-09-05 NOTE — Assessment & Plan Note (Signed)
Patient unaware of diagnosis. Per chart review it was seen that patient had workup in the hospital in 09/2015 and had blood cultures and TEE obtained. Patient was seen by cardiology who recommended anticoagulation and to consider surgery if recurrent events. Patient completed 6 months of anticoagulation with warfarin and then transitioned to Aspirin 81mg  daily.

## 2017-09-05 NOTE — Patient Instructions (Signed)
It was a pleasure seeing you today.   Today we discussed your new patient exam   For your health: please continue to eat healthy and exercise daily. If you need any refills on medications please contact your pharmacy.   Please follow up as needed or sooner if symptoms persist or worsen. Please call the clinic immediately if you have any concerns.   Our clinic's number is [phone]. Please call with questions or concerns.   Please go to the emergency room if you have chest pain or shortness of breath.   Thank you,  Caroline More, DO

## 2017-09-07 ENCOUNTER — Ambulatory Visit: Payer: Self-pay

## 2017-09-08 ENCOUNTER — Other Ambulatory Visit (INDEPENDENT_AMBULATORY_CARE_PROVIDER_SITE_OTHER): Payer: Self-pay | Admitting: Family Medicine

## 2017-09-08 DIAGNOSIS — Z79899 Other long term (current) drug therapy: Secondary | ICD-10-CM

## 2017-09-10 ENCOUNTER — Other Ambulatory Visit: Payer: Self-pay | Admitting: *Deleted

## 2017-09-10 DIAGNOSIS — E876 Hypokalemia: Secondary | ICD-10-CM

## 2017-09-10 MED ORDER — POTASSIUM CHLORIDE CRYS ER 20 MEQ PO TBCR: 20.0000 meq | EXTENDED_RELEASE_TABLET | Freq: Every day | ORAL | 0 refills | Status: DC

## 2017-09-10 NOTE — Telephone Encounter (Signed)
Pt has chosen another PCP. Please change me from being her PCP. Also send this request to her new PCP (see chart for her last visit at [facility])

## 2017-09-13 ENCOUNTER — Encounter (HOSPITAL_COMMUNITY): Payer: Self-pay

## 2017-09-13 ENCOUNTER — Other Ambulatory Visit: Payer: Self-pay

## 2017-09-13 ENCOUNTER — Emergency Department (HOSPITAL_COMMUNITY)
Admission: EM | Admit: 2017-09-13 | Discharge: 2017-09-14 | Disposition: A | Discharge status: Home / Self Care | Payer: PPO | Attending: Emergency Medicine | Admitting: Emergency Medicine

## 2017-09-13 DIAGNOSIS — Z79899 Other long term (current) drug therapy: Secondary | ICD-10-CM | POA: Insufficient documentation

## 2017-09-13 DIAGNOSIS — I10 Essential (primary) hypertension: Secondary | ICD-10-CM | POA: Insufficient documentation

## 2017-09-13 DIAGNOSIS — Z7982 Long term (current) use of aspirin: Secondary | ICD-10-CM | POA: Insufficient documentation

## 2017-09-13 DIAGNOSIS — R109 Unspecified abdominal pain: Secondary | ICD-10-CM | POA: Diagnosis not present

## 2017-09-13 DIAGNOSIS — M545 Low back pain, unspecified: Secondary | ICD-10-CM

## 2017-09-13 DIAGNOSIS — Z91040 Latex allergy status: Secondary | ICD-10-CM | POA: Insufficient documentation

## 2017-09-13 DIAGNOSIS — M549 Dorsalgia, unspecified: Secondary | ICD-10-CM | POA: Diagnosis present

## 2017-09-13 DIAGNOSIS — Z87891 Personal history of nicotine dependence: Secondary | ICD-10-CM | POA: Diagnosis not present

## 2017-09-13 DIAGNOSIS — K429 Umbilical hernia without obstruction or gangrene: Secondary | ICD-10-CM | POA: Diagnosis not present

## 2017-09-13 LAB — URINALYSIS, ROUTINE W REFLEX MICROSCOPIC
BACTERIA UA: NONE SEEN
BILIRUBIN URINE: NEGATIVE
Glucose, UA: NEGATIVE mg/dL
Ketones, ur: NEGATIVE mg/dL
LEUKOCYTES UA: NEGATIVE
Nitrite: NEGATIVE
PROTEIN: NEGATIVE mg/dL
Specific Gravity, Urine: 1.015 (ref 1.005–1.030)
pH: 5 (ref 5.0–8.0)

## 2017-09-13 NOTE — ED Triage Notes (Signed)
Pt reports that for the past three days she has been having bilateral flank and back pain, denies urinary symptoms. Pt also endorses lower abd pain, denies n/v/d last BM today, hx of obstruction, pt also adds bilateral elbow pain, denies injury

## 2017-09-14 ENCOUNTER — Emergency Department (HOSPITAL_COMMUNITY): Payer: PPO

## 2017-09-14 DIAGNOSIS — K429 Umbilical hernia without obstruction or gangrene: Secondary | ICD-10-CM | POA: Diagnosis not present

## 2017-09-14 LAB — CBC
HCT: 40 % (ref 36.0–46.0)
Hemoglobin: 12.7 g/dL (ref 12.0–15.0)
MCH: 30.8 pg (ref 26.0–34.0)
MCHC: 31.8 g/dL (ref 30.0–36.0)
MCV: 97.1 fL (ref 78.0–100.0)
Platelets: 284 10*3/uL (ref 150–400)
RBC: 4.12 MIL/uL (ref 3.87–5.11)
RDW: 13.8 % (ref 11.5–15.5)
WBC: 13.2 10*3/uL — ABNORMAL HIGH (ref 4.0–10.5)

## 2017-09-14 LAB — LIPASE, BLOOD: Lipase: 29 U/L (ref 11–51)

## 2017-09-14 LAB — COMPREHENSIVE METABOLIC PANEL
ALK PHOS: 115 U/L (ref 38–126)
ALT: 24 U/L (ref 14–54)
ANION GAP: 9 (ref 5–15)
AST: 25 U/L (ref 15–41)
Albumin: 3.8 g/dL (ref 3.5–5.0)
BUN: 11 mg/dL (ref 6–20)
CALCIUM: 9.3 mg/dL (ref 8.9–10.3)
CHLORIDE: 105 mmol/L (ref 101–111)
CO2: 28 mmol/L (ref 22–32)
Creatinine, Ser: 1.05 mg/dL — ABNORMAL HIGH (ref 0.44–1.00)
GFR calc non Af Amer: 53 mL/min — ABNORMAL LOW (ref >60)
Glucose, Bld: 119 mg/dL — ABNORMAL HIGH (ref 65–99)
Potassium: 3.2 mmol/L — ABNORMAL LOW (ref 3.5–5.1)
SODIUM: 142 mmol/L (ref 135–145)
Total Bilirubin: 0.6 mg/dL (ref 0.3–1.2)
Total Protein: 7.2 g/dL (ref 6.5–8.1)

## 2017-09-14 MED ORDER — METHOCARBAMOL 500 MG PO TABS
500.0000 mg | ORAL_TABLET | Freq: Once | ORAL | Status: AC
  Administered 2017-09-14: 500 mg via ORAL
  Filled 2017-09-14: qty 1

## 2017-09-14 MED ORDER — METHOCARBAMOL 500 MG PO TABS: 500.0000 mg | ORAL_TABLET | Freq: Two times a day (BID) | ORAL | 0 refills | Status: DC

## 2017-09-14 MED ORDER — HYDROCODONE-ACETAMINOPHEN 5-325 MG PO TABS
1.0000 | ORAL_TABLET | Freq: Once | ORAL | Status: AC
  Administered 2017-09-14: 1 via ORAL
  Filled 2017-09-14: qty 1

## 2017-09-14 MED ORDER — DICLOFENAC SODIUM 1 % TD GEL: 2.0000 g | Freq: Four times a day (QID) | TRANSDERMAL | 1 refills | Status: DC

## 2017-09-14 MED ORDER — IOHEXOL 300 MG/ML  SOLN
100.0000 mL | Freq: Once | INTRAMUSCULAR | Status: AC | PRN
  Administered 2017-09-14: 100 mL via INTRAVENOUS

## 2017-09-14 NOTE — ED Provider Notes (Signed)
[place] EMERGENCY DEPARTMENT Provider Note   CSN: [id number] Arrival date & time: 09/13/17  2251     History   Chief Complaint Chief Complaint  Patient presents with  . Abdominal Pain    HPI [name] is a 67 y.o. female.  Patient is a 67 year old female with a history of hypertension, hyperlipidemia, aortic valve vegetation and prior history of bowel obstruction and colon surgery who is presenting today with 2 weeks of worsening bilateral back pain that radiates around into her lower abdomen.  She states that gotten worse in the last few days.  She is having normal bowel movements and no nausea or vomiting.  Eating does not seem to affect the pain.  Certain movements can make it worse.  She is tried medications at home that are over-the-counter without any improvement.   She denies any vaginal or urinary complaints at this time.  Patient states that she had a terrible fall in December resulting in an ankle fracture but also had pain in her back at that time.  She underwent physical therapy and things had improved but now her symptoms have worsened.  She denies any recent falls or trauma.   The history is provided by the patient.  Abdominal Pain   Pertinent negatives include fever.    Past Medical History:  Diagnosis Date  . Abnormal EKG 2012   hospitalized for T wave inversion in lateral leads with MSK chest pains, normal ECHO, negative trops, no cardiology consult. recent EKG 7/15 stable T wave inversions.   . 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 Arthritis 2000  . Arthritis 2011  . Heart murmur   . Hyperlipidemia 2011  . Hypertension 2011  . Meniscus tear 2000   L KNEE    Patient Active Problem List   Diagnosis Date Noted  . Osteopenia 09/05/2017  . 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 HLD (hyperlipidemia)   . Aortic atherosclerosis ([place])   . Right-sided low back pain without sciatica 07/08/2015  . [place]  of blind spot area in visual field 06/14/2015  . Bilateral leg pain 06/14/2015  . Seasonal allergies 03/09/2015  . Hypopigmentation 03/09/2015  . Osteoarthritis of left knee 07/08/2014  . DJD (degenerative joint disease), lumbar 07/08/2014  . Osteoarthritis of right knee 07/08/2014  . Healthcare maintenance 06/11/2014  . Allergic rhinoconjunctivitis of both eyes 06/11/2014  . Nearsightedness 04/27/2014  . Abnormal EKG   . Hypertension 12/11/2013    Past Surgical History:  Procedure Laterality Date  . ABDOMINAL SURGERY  2010   for bowel blockage  . COLON SURGERY  2011  . KNEE ARTHROSCOPY Left 09/25/2013   Procedure: LEFT KNEE ARTHROSCOPY WITH PARTIAL MEDIAL AND LATERAL  MENISCECTOMY/DEBRIDEMENT/MICRO FRACTURE MEDIAL FEMORAL CONDYLE, REMOVAL OF OSTEOCONDRAL FRAGMENT;  Surgeon: Johnn Hai, MD;  Location: [facility];  Service: Orthopedics;  Laterality: Left;  . KNEE SURGERY Right 1999  . TEE WITHOUT CARDIOVERSION N/A 10/04/2015   Procedure: TRANSESOPHAGEAL ECHOCARDIOGRAM (TEE);  Surgeon: Sueanne Margarita, MD;  Location: [facility] ENDOSCOPY;  Service: Cardiovascular;  Laterality: N/A;  . TUBAL LIGATION  1978     OB History   None      Home Medications    Prior to Admission medications   Medication Sig Start Date End Date Taking? Authorizing Provider  amLODipine (NORVASC) 10 MG tablet Take 1 tablet (10 mg total) by mouth daily. 03/12/17   Clent Demark, PA-C  aspirin EC 81 MG tablet Take 1  tablet (81 mg total) daily by mouth. 01/31/17   Clent Demark, PA-C  atorvastatin (LIPITOR) 40 MG tablet TAKE 1 TABLET(40 MG) BY MOUTH DAILY AT 6 PM 10/12/16   Funches, Adriana Mccallum, MD  carvedilol (COREG) 12.5 MG tablet TAKE 1 TABLET(12.5 MG) BY MOUTH TWICE DAILY WITH A MEAL 03/12/17   Clent Demark, PA-C  hydrochlorothiazide (HYDRODIURIL) 25 MG tablet Take 1 tablet (25 mg total) by mouth daily. Take on tablet in the morning. 03/12/17   Clent Demark, PA-C  Multiple Vitamin (MULITIVITAMIN  WITH MINERALS) TABS Take 1 tablet by mouth daily.    [provider]  potassium chloride SA (K-DUR,KLOR-CON) 20 MEQ tablet Take 1 tablet (20 mEq total) by mouth daily. 09/10/17   Caroline More, DO    Family History Family History  Problem Relation Age of Onset  . Hypertension Mother   . Heart disease Mother        has a pacemaker   . Alzheimer's disease Mother   . Hyperlipidemia Mother   . Osteoporosis Mother   . Heart failure Father   . Hypertension Father   . Alzheimer's disease Father   . Hypertension Sister   . Hypertension Sister   . Gout Sister   . Alcohol abuse Son   . Pancreatitis Son   . Cancer Neg Hx   . Colon cancer Neg Hx     Social History Social History   Tobacco Use  . Smoking status: Former Smoker    Last attempt to quit: 07/29/2010    Years since quitting: 7.1  . Smokeless tobacco: Never Used  Substance Use Topics  . Alcohol use: Never    Alcohol/week: 0.0 oz    Frequency: Never  . Drug use: Never     Allergies   Shrimp [shellfish allergy]; Latex; and Penicillins   Review of Systems Review of Systems  Constitutional: Negative for fever.  Gastrointestinal: Positive for abdominal pain.  All other systems reviewed and are negative.    Physical Exam Updated Vital Signs BP (!) 142/74   Pulse 69   Temp 98 F (36.7 C) (Oral)   Resp 18   SpO2 100%   Physical Exam  Constitutional: She is oriented to person, place, and time. She appears well-developed and well-nourished. No distress.  HENT:  Head: Normocephalic and atraumatic.  Eyes: Pupils are equal, round, and reactive to light. EOM are normal.  Cardiovascular: Normal rate, regular rhythm, normal heart sounds and intact distal pulses. Exam reveals no friction rub.  No murmur heard. Pulmonary/Chest: Effort normal and breath sounds normal. She has no wheezes. She has no rales.  Abdominal: Soft. Bowel sounds are normal. She exhibits no distension. There is tenderness in the  suprapubic area. There is no rebound and no guarding.  Well-healed midline abdominal scar  Musculoskeletal: Normal range of motion.       Lumbar back: She exhibits tenderness, pain and spasm. She exhibits normal range of motion and no bony tenderness.       Back:  No edema  Neurological: She is alert and oriented to person, place, and time. No cranial nerve deficit.  Skin: Skin is warm and dry. No rash noted.  Psychiatric: She has a normal mood and affect. Her behavior is normal.  Nursing note and vitals reviewed.    ED Treatments / Results  Labs (all labs ordered are listed, but only abnormal results are displayed) Labs Reviewed  COMPREHENSIVE METABOLIC PANEL - Abnormal; Notable for the following components:  Result Value   Potassium 3.2 (*)    Glucose, Bld 119 (*)    Creatinine, Ser 1.05 (*)    GFR calc non Af Amer 53 (*)    All other components within normal limits  CBC - Abnormal; Notable for the following components:   WBC 13.2 (*)    All other components within normal limits  URINALYSIS, ROUTINE W REFLEX MICROSCOPIC - Abnormal; Notable for the following components:   APPearance HAZY (*)    Hgb urine dipstick SMALL (*)    All other components within normal limits  LIPASE, BLOOD    EKG None  Radiology Ct Abdomen Pelvis W Contrast  Result Date: 09/14/2017 CLINICAL DATA:  Three day history of bilateral flank pain and back pain. EXAM: CT ABDOMEN AND PELVIS WITH CONTRAST TECHNIQUE: Multidetector CT imaging of the abdomen and pelvis was performed using the standard protocol following bolus administration of intravenous contrast. CONTRAST:  169mL OMNIPAQUE IOHEXOL 300 MG/ML  SOLN COMPARISON:  05/07/2017 FINDINGS: Lower chest: Atelectasis in the lung bases. Coronary artery calcifications. Hepatobiliary: No focal liver abnormality is seen. No gallstones, gallbladder wall thickening, or biliary dilatation. Pancreas: Unremarkable. No pancreatic ductal dilatation or surrounding  inflammatory changes. Spleen: Normal in size without focal abnormality. Adrenals/Urinary Tract: Adrenal glands are unremarkable. Kidneys are normal, without renal calculi, focal lesion, or hydronephrosis. Bladder is unremarkable. Stomach/Bowel: Stomach is within normal limits. Appendix appears normal. No evidence of bowel wall thickening, distention, or inflammatory changes. Vascular/Lymphatic: Aortic atherosclerosis. No enlarged abdominal or pelvic lymph nodes. Reproductive: Multiple uterine calcifications consistent with calcified fibroids. No abnormal adnexal masses. Other: No free air or free fluid in the abdomen. Small periumbilical hernia containing fat. Infiltration in the herniated fat may indicate fat necrosis. No bowel herniation. Musculoskeletal: Degenerative changes in the spine. Lumbar scoliosis convex towards the right. No destructive bone lesions. IMPRESSION: 1. No evidence of bowel obstruction or inflammation. 2. Small periumbilical hernia containing fat with possible fat necrosis. 3. Aortic atherosclerosis. 4. Calcified uterine fibroids. Electronically Signed   By: Lucienne Capers M.D.   On: 09/14/2017 04:04    Procedures Procedures (including critical care time)  Medications Ordered in ED Medications  HYDROcodone-acetaminophen (NORCO/VICODIN) 5-325 MG per tablet 1 tablet (1 tablet Oral Given 09/14/17 0214)  methocarbamol (ROBAXIN) tablet 500 mg (500 mg Oral Given 09/14/17 0214)     Initial Impression / Assessment and Plan / ED Course  I have reviewed the triage vital signs and the nursing notes.  Pertinent labs & imaging results that were available during my care of the patient were reviewed by me and considered in my medical decision making (see chart for details).     Patient presenting today with bilateral back pain radiating to the front of the abdomen that started a few weeks ago but worse in the last 3 days.  Patient denies any nausea vomiting or change in bowel movements  with lower suspicion for obstruction.  She does have some suprapubic tenderness but no evidence of UTI and low concern for pyelonephritis at this time.  Possible diverticulitis this patient does have a mild leukocytosis of 13,000 but CMP is otherwise within normal limits and low suspicion for hepatitis, cholecystitis or pancreatitis.  Patient symptoms may be musculoskeletal in nature as she has had ongoing back issues however given her prior history of bowel obstruction and colon surgery we will do a CT of the abdomen and pelvis to ensure no further pathology.  Patient's vital signs are reassuring and low suspicion for  AAA.  Patient given oral pain and spasm control.  4:52 AM Patient CT without acute findings except for some fat necrosis in the umbilical hernia.  Feel most likely patient's back discomfort is musculoskeletal in nature.  Will discharge home to follow-up with PCP.  Given supportive care.  Final Clinical Impressions(s) / ED Diagnoses   Final diagnoses:  Acute bilateral low back pain without sciatica    ED Discharge Orders        Ordered    methocarbamol (ROBAXIN) 500 MG tablet  2 times daily     09/14/17 0451    diclofenac sodium (VOLTAREN) 1 % GEL  4 times daily     09/14/17 0451       Blanchie Dessert, MD 09/14/17 [phone]

## 2017-09-21 ENCOUNTER — Encounter: Payer: Self-pay | Admitting: Gastroenterology

## 2017-10-16 ENCOUNTER — Other Ambulatory Visit: Payer: Self-pay | Admitting: Family Medicine

## 2017-10-16 DIAGNOSIS — E876 Hypokalemia: Secondary | ICD-10-CM

## 2017-10-22 DIAGNOSIS — M25562 Pain in left knee: Secondary | ICD-10-CM | POA: Diagnosis not present

## 2017-10-22 DIAGNOSIS — M25462 Effusion, left knee: Secondary | ICD-10-CM | POA: Diagnosis not present

## 2017-10-25 ENCOUNTER — Other Ambulatory Visit: Payer: Self-pay | Admitting: Family Medicine

## 2017-10-25 DIAGNOSIS — Z1231 Encounter for screening mammogram for malignant neoplasm of breast: Secondary | ICD-10-CM

## 2017-10-26 ENCOUNTER — Emergency Department (HOSPITAL_COMMUNITY): Payer: PPO

## 2017-10-26 ENCOUNTER — Emergency Department (HOSPITAL_COMMUNITY)
Admission: EM | Admit: 2017-10-26 | Discharge: 2017-10-26 | Disposition: A | Discharge status: Home / Self Care | Payer: PPO | Attending: Emergency Medicine | Admitting: Emergency Medicine

## 2017-10-26 ENCOUNTER — Emergency Department (HOSPITAL_BASED_OUTPATIENT_CLINIC_OR_DEPARTMENT_OTHER): Payer: PPO

## 2017-10-26 ENCOUNTER — Other Ambulatory Visit: Payer: Self-pay

## 2017-10-26 DIAGNOSIS — Z7982 Long term (current) use of aspirin: Secondary | ICD-10-CM | POA: Insufficient documentation

## 2017-10-26 DIAGNOSIS — M79609 Pain in unspecified limb: Secondary | ICD-10-CM

## 2017-10-26 DIAGNOSIS — Z91040 Latex allergy status: Secondary | ICD-10-CM | POA: Diagnosis not present

## 2017-10-26 DIAGNOSIS — M25562 Pain in left knee: Secondary | ICD-10-CM | POA: Diagnosis not present

## 2017-10-26 DIAGNOSIS — M1712 Unilateral primary osteoarthritis, left knee: Secondary | ICD-10-CM | POA: Insufficient documentation

## 2017-10-26 DIAGNOSIS — T63441A Toxic effect of venom of bees, accidental (unintentional), initial encounter: Secondary | ICD-10-CM

## 2017-10-26 DIAGNOSIS — I10 Essential (primary) hypertension: Secondary | ICD-10-CM | POA: Diagnosis not present

## 2017-10-26 DIAGNOSIS — M79641 Pain in right hand: Secondary | ICD-10-CM | POA: Diagnosis present

## 2017-10-26 DIAGNOSIS — Z79899 Other long term (current) drug therapy: Secondary | ICD-10-CM | POA: Insufficient documentation

## 2017-10-26 DIAGNOSIS — Z87891 Personal history of nicotine dependence: Secondary | ICD-10-CM | POA: Insufficient documentation

## 2017-10-26 MED ORDER — METHOCARBAMOL 500 MG PO TABS: 500.0000 mg | ORAL_TABLET | Freq: Two times a day (BID) | ORAL | 0 refills | Status: DC

## 2017-10-26 MED ORDER — DICLOFENAC SODIUM 1 % TD GEL: 2.0000 g | Freq: Four times a day (QID) | TRANSDERMAL | 0 refills | Status: DC

## 2017-10-26 MED ORDER — HYDROCORTISONE 1 % EX CREA: TOPICAL_CREAM | CUTANEOUS | 0 refills | Status: DC

## 2017-10-26 NOTE — Progress Notes (Signed)
LLE venous duplex prelim: negative for DVT. Renette Hsu Eunice, RDMS, RVT  

## 2017-10-26 NOTE — ED Notes (Signed)
Patient transported to X-ray 

## 2017-10-26 NOTE — ED Notes (Signed)
Patient refused vitals and to sign discharge paperwork.

## 2017-10-26 NOTE — ED Notes (Signed)
Patient transported to Vascular 

## 2017-10-26 NOTE — Discharge Instructions (Addendum)
Your x-ray showed that you had arthritis in your knee.  The ultrasound of your leg was negative for a blood clot. Return to ED for worsening symptoms, injuries or falls, numbness in your leg, swelling of your leg.

## 2017-10-26 NOTE — ED Provider Notes (Signed)
[facility] EMERGENCY DEPARTMENT Provider Note   CSN: [id number] Arrival date & time: 10/26/17  0500     History   Chief Complaint Chief Complaint  Patient presents with  . Multiple complaints    HPI [name] is a 67 y.o. female with a past medical history of hypertension, arthritis, who presents to ED for evaluation of left knee pain intermittently for the past 2 weeks.  States that the symptoms began suddenly.  She has tried Celebrex as prescribed by her PCP with only mild improvement in her symptoms.  States that she has had arthroscopic surgery on the knee in 2008.  He denies any injuries or falls, numbness in legs, history of gout, fever.  She states that she is concerned that she has a blood clot in her leg because "I had it in my kidney once, and I'm going out of town."  Denies any prior DVT or PE, chest pain, shortness of breath, recent surgeries, recent prolonged travel, hormone use, hemoptysis. Her next complaint is bee sting to dominant right hand.  States that happened 2 days ago.  She has been taking nitro with improvement in her symptoms.  States that the area is itchy.  Denies any lip swelling, trouble breathing or trouble swallowing.  HPI  Past Medical History:  Diagnosis Date  . Abnormal EKG 2012   hospitalized for T wave inversion in lateral leads with MSK chest pains, normal ECHO, negative trops, no cardiology consult. recent EKG 7/15 stable T wave inversions.   . 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 Arthritis 2000  . Arthritis 2011  . Heart murmur   . Hyperlipidemia 2011  . Hypertension 2011  . Meniscus tear 2000   L KNEE    Patient Active Problem List   Diagnosis Date Noted  . Osteopenia 09/05/2017  . 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 HLD (hyperlipidemia)   . Aortic atherosclerosis ([place])   . Right-sided low back pain without sciatica 07/08/2015  . Scotoma of blind spot area in visual field  06/14/2015  . Bilateral leg pain 06/14/2015  . Seasonal allergies 03/09/2015  . Hypopigmentation 03/09/2015  . Osteoarthritis of left knee 07/08/2014  . DJD (degenerative joint disease), lumbar 07/08/2014  . Osteoarthritis of right knee 07/08/2014  . Healthcare maintenance 06/11/2014  . Allergic rhinoconjunctivitis of both eyes 06/11/2014  . Nearsightedness 04/27/2014  . Abnormal EKG   . Hypertension 12/11/2013    Past Surgical History:  Procedure Laterality Date  . ABDOMINAL SURGERY  2010   for bowel blockage  . COLON SURGERY  2011  . KNEE ARTHROSCOPY Left 09/25/2013   Procedure: LEFT KNEE ARTHROSCOPY WITH PARTIAL MEDIAL AND LATERAL  MENISCECTOMY/DEBRIDEMENT/MICRO FRACTURE MEDIAL FEMORAL CONDYLE, REMOVAL OF OSTEOCONDRAL FRAGMENT;  Surgeon: Johnn Hai, MD;  Location: [facility] OR[facility];  Service: Orthopedics;  Laterality: Left;  . KNEE SURGERY Right 1999  . TEE WITHOUT CARDIOVERSION N/A 10/04/2015   Procedure: TRANSESOPHAGEAL ECHOCARDIOGRAM (TEE);  Surgeon: Sueanne Margarita, MD;  Location: [facility] ENDOSCOPY;  Service: Cardiovascular;  Laterality: N/A;  . TUBAL LIGATION  1978     OB History   None      Home Medications    Prior to Admission medications   Medication Sig Start Date End Date Taking? Authorizing Provider  amLODipine (NORVASC) 10 MG tablet Take 1 tablet (10 mg total) by mouth daily. 03/12/17   Clent Demark, PA-C  aspirin EC 81 MG tablet Take 1 tablet (81 mg total)  daily by mouth. 01/31/17   Clent Demark, PA-C  atorvastatin (LIPITOR) 40 MG tablet TAKE 1 TABLET(40 MG) BY MOUTH DAILY AT 6 PM 10/12/16   Funches, Adriana Mccallum, MD  carvedilol (COREG) 12.5 MG tablet TAKE 1 TABLET(12.5 MG) BY MOUTH TWICE DAILY WITH A MEAL 03/12/17   Clent Demark, PA-C  diclofenac sodium (VOLTAREN) 1 % GEL Apply 2 g topically 4 (four) times daily. 10/26/17   Kalyani Maeda, PA-C  hydrochlorothiazide (HYDRODIURIL) 25 MG tablet Take 1 tablet (25 mg total) by mouth daily. Take on tablet in the morning.  03/12/17   Clent Demark, PA-C  hydrocortisone cream 1 % Apply to affected area 2 times daily 10/26/17   Eleanore Junio, PA-C  methocarbamol (ROBAXIN) 500 MG tablet Take 1 tablet (500 mg total) by mouth 2 (two) times daily. 10/26/17   Haileigh Pitz, PA-C  Multiple Vitamin (MULITIVITAMIN WITH MINERALS) TABS Take 1 tablet by mouth daily.    [provider]  potassium chloride SA (K-DUR,KLOR-CON) 20 MEQ tablet TAKE 1 TABLET(20 MEQ) BY MOUTH DAILY 10/16/17   Caroline More, DO    Family History Family History  Problem Relation Age of Onset  . Hypertension Mother   . Heart disease Mother        has a pacemaker   . Alzheimer's disease Mother   . Hyperlipidemia Mother   . Osteoporosis Mother   . Heart failure Father   . Hypertension Father   . Alzheimer's disease Father   . Hypertension Sister   . Hypertension Sister   . Gout Sister   . Alcohol abuse Son   . Pancreatitis Son   . Cancer Neg Hx   . Colon cancer Neg Hx     Social History Social History   Tobacco Use  . Smoking status: Former Smoker    Last attempt to quit: 07/29/2010    Years since quitting: 7.2  . Smokeless tobacco: Never Used  Substance Use Topics  . Alcohol use: Never    Alcohol/week: 0.0 oz    Frequency: Never  . Drug use: Never     Allergies   Shrimp [shellfish allergy]; Latex; and Penicillins   Review of Systems Review of Systems  Constitutional: Negative for appetite change, chills and fever.  HENT: Negative for ear pain, rhinorrhea, sneezing and sore throat.   Eyes: Negative for photophobia and visual disturbance.  Respiratory: Negative for cough, chest tightness, shortness of breath and wheezing.   Cardiovascular: Negative for chest pain and palpitations.  Gastrointestinal: Negative for abdominal pain, blood in stool, constipation, diarrhea, nausea and vomiting.  Genitourinary: Negative for dysuria, hematuria and urgency.  Musculoskeletal: Positive for arthralgias and joint swelling.  Negative for myalgias.  Skin: Negative for rash.  Neurological: Negative for dizziness, weakness and light-headedness.     Physical Exam Updated Vital Signs BP (!) 115/58   Pulse (!) 57   Temp (!) 97.3 F (36.3 C) (Oral)   Resp 16   Ht 5\' 5"  (1.651 m)   Wt 72.6 kg (160 lb)   SpO2 100%   BMI 26.63 kg/m   Physical Exam  Constitutional: She appears well-developed and well-nourished. No distress.  HENT:  Head: Normocephalic and atraumatic.  Nose: Nose normal.  Eyes: Conjunctivae and EOM are normal. Left eye exhibits no discharge. No scleral icterus.  Neck: Normal range of motion. Neck supple.  Cardiovascular: Normal rate, regular rhythm, normal heart sounds and intact distal pulses. Exam reveals no gallop and no friction rub.  No murmur heard.  Pulmonary/Chest: Effort normal and breath sounds normal. No respiratory distress.  Abdominal: Soft. Bowel sounds are normal. She exhibits no distension. There is no tenderness. There is no guarding.  Musculoskeletal: Normal range of motion. She exhibits tenderness. She exhibits no edema.       Legs: TTP of medial L knee with mild edema noted. No calf tenderness bilaterally. No diffuse edema or erythema noted. No changes to ROM of knee.   Neurological: She is alert. She exhibits normal muscle tone. Coordination normal.  Skin: Skin is warm and dry. No rash noted.  Mild edema of dorsum of R hand near 3rd MCP joint.  Psychiatric: She has a normal mood and affect.  Nursing note and vitals reviewed.    ED Treatments / Results  Labs (all labs ordered are listed, but only abnormal results are displayed) Labs Reviewed - No data to display  EKG None  Radiology Dg Knee Complete 4 Views Left  Result Date: 10/26/2017 CLINICAL DATA:  LEFT knee pain.  No injury. EXAM: LEFT KNEE - COMPLETE 4+ VIEW COMPARISON:  LEFT knee radiograph May 11, 2015. FINDINGS: No fracture deformity or dislocation. He has a moderate to severe medial compartment  joint space narrowing with periarticular sclerosis and marginal spurring. Moderate patellofemoral compartment narrowing, mild lateral and narrowed with marginal spurring. No destructive bony lesions. Soft tissue planes are non suspicious. IMPRESSION: 1. No acute fracture deformity or dislocation. 2. Mild tricompartmental osteoarthrosis, moderate to severe within medial compartment. Electronically Signed   By: Elon Alas M.D.   On: 10/26/2017 06:12   LLE venous duplex prelim: negative for DVT.  Landry Mellow, RDMS, RVT   Procedures Procedures (including critical care time)  Medications Ordered in ED Medications - No data to display   Initial Impression / Assessment and Plan / ED Course  I have reviewed the triage vital signs and the nursing notes.  Pertinent labs & imaging results that were available during my care of the patient were reviewed by me and considered in my medical decision making (see chart for details).     67 year old female presents to ED for evaluation of left knee pain intermittently for the past 2 weeks.  Reports only mild improvement with Celebrex as prescribed by her PCP.  She is concerned about a DVT.  Denies any prior DVT, chest pain, shortness of breath, recent surgeries, recent prolonged travel.  Tenderness to palpation of the left medial patella with no changes to range of motion noted.  X-ray shows findings consistent with osteoarthritis.  Ultrasound is negative for DVT.  Suspect arthritis as the cause of her symptoms.  Will give voltaren gel, muscle relaxer and PCP follow up. Will give cortisone cream for bee sting reaction for itching. Will advise to return to ED for any severe or worsening symptoms. Patient discussed with and seen by my attending, Dr. Jeanell Sparrow.  Portions of this note were generated with [employer]. Dictation errors may occur despite best attempts at proofreading.   Final Clinical Impressions(s) / ED Diagnoses   Final  diagnoses:  Primary osteoarthritis of left knee  Bee sting, accidental or unintentional, initial encounter    ED Discharge Orders        Ordered    diclofenac sodium (VOLTAREN) 1 % GEL  4 times daily     10/26/17 1013    methocarbamol (ROBAXIN) 500 MG tablet  2 times daily     10/26/17 1013    hydrocortisone cream 1 %     10/26/17  [address], PA-C 10/26/17 1029    Pattricia Boss, MD 10/26/17 1556

## 2017-10-26 NOTE — ED Notes (Signed)
ED Provider at bedside. 

## 2017-10-26 NOTE — ED Triage Notes (Signed)
Patient c/o left hip pain that radiates to knee x2 weeks and bee sting to right hand yesterday. Took (2) Benadryl, twice per day, yesterday. Denies SOB or CP.

## 2017-11-08 ENCOUNTER — Other Ambulatory Visit: Payer: Self-pay | Admitting: Family Medicine

## 2017-11-08 DIAGNOSIS — E785 Hyperlipidemia, unspecified: Secondary | ICD-10-CM

## 2017-11-14 ENCOUNTER — Encounter: Payer: Self-pay | Admitting: Gastroenterology

## 2017-11-14 ENCOUNTER — Ambulatory Visit (AMBULATORY_SURGERY_CENTER): Payer: Self-pay | Admitting: *Deleted

## 2017-11-14 VITALS — Ht 65.0 in | Wt 171.0 lb

## 2017-11-14 DIAGNOSIS — Z86010 Personal history of colonic polyps: Secondary | ICD-10-CM

## 2017-11-14 MED ORDER — PEG 3350-KCL-NA BICARB-NACL 420 G PO SOLR: 4000.0000 mL | Freq: Once | ORAL | 0 refills | Status: AC

## 2017-11-14 NOTE — Progress Notes (Signed)
Patient denies any allergies to eggs or soy. Patient denies any problems with anesthesia/sedation. Patient denies any oxygen use at home. Patient denies taking any diet/weight loss medications or blood thinners. EMMI education offered, pt declined.  

## 2017-11-22 ENCOUNTER — Ambulatory Visit
Admission: RE | Admit: 2017-11-22 | Discharge: 2017-11-22 | Disposition: A | Discharge status: Home / Self Care | Payer: PPO | Source: Ambulatory Visit | Attending: Family Medicine | Admitting: Family Medicine

## 2017-11-22 DIAGNOSIS — Z1231 Encounter for screening mammogram for malignant neoplasm of breast: Secondary | ICD-10-CM

## 2017-11-29 ENCOUNTER — Encounter (HOSPITAL_COMMUNITY): Payer: Self-pay | Admitting: Emergency Medicine

## 2017-11-29 ENCOUNTER — Emergency Department (HOSPITAL_COMMUNITY)
Admission: EM | Admit: 2017-11-29 | Discharge: 2017-11-30 | Disposition: A | Discharge status: Home / Self Care | Payer: PPO | Attending: Emergency Medicine | Admitting: Emergency Medicine

## 2017-11-29 DIAGNOSIS — R309 Painful micturition, unspecified: Secondary | ICD-10-CM | POA: Insufficient documentation

## 2017-11-29 DIAGNOSIS — M7918 Myalgia, other site: Secondary | ICD-10-CM | POA: Diagnosis not present

## 2017-11-29 DIAGNOSIS — R300 Dysuria: Secondary | ICD-10-CM | POA: Diagnosis not present

## 2017-11-29 DIAGNOSIS — R102 Pelvic and perineal pain: Secondary | ICD-10-CM | POA: Insufficient documentation

## 2017-11-29 DIAGNOSIS — I10 Essential (primary) hypertension: Secondary | ICD-10-CM | POA: Diagnosis not present

## 2017-11-29 DIAGNOSIS — Z87891 Personal history of nicotine dependence: Secondary | ICD-10-CM | POA: Insufficient documentation

## 2017-11-29 DIAGNOSIS — R1030 Lower abdominal pain, unspecified: Secondary | ICD-10-CM | POA: Diagnosis not present

## 2017-11-29 DIAGNOSIS — Z91040 Latex allergy status: Secondary | ICD-10-CM | POA: Diagnosis not present

## 2017-11-29 DIAGNOSIS — M791 Myalgia, unspecified site: Secondary | ICD-10-CM | POA: Insufficient documentation

## 2017-11-29 DIAGNOSIS — Z79899 Other long term (current) drug therapy: Secondary | ICD-10-CM | POA: Insufficient documentation

## 2017-11-29 DIAGNOSIS — Z7982 Long term (current) use of aspirin: Secondary | ICD-10-CM | POA: Insufficient documentation

## 2017-11-29 DIAGNOSIS — N200 Calculus of kidney: Secondary | ICD-10-CM | POA: Diagnosis not present

## 2017-11-29 LAB — URINALYSIS, ROUTINE W REFLEX MICROSCOPIC
BACTERIA UA: NONE SEEN
Bilirubin Urine: NEGATIVE
GLUCOSE, UA: NEGATIVE mg/dL
Ketones, ur: NEGATIVE mg/dL
Leukocytes, UA: NEGATIVE
Nitrite: NEGATIVE
Protein, ur: NEGATIVE mg/dL
Specific Gravity, Urine: 1.011 (ref 1.005–1.030)
pH: 6 (ref 5.0–8.0)

## 2017-11-29 LAB — BASIC METABOLIC PANEL
ANION GAP: 13 (ref 5–15)
BUN: 7 mg/dL — ABNORMAL LOW (ref 8–23)
CO2: 24 mmol/L (ref 22–32)
Calcium: 9.4 mg/dL (ref 8.9–10.3)
Chloride: 103 mmol/L (ref 98–111)
Creatinine, Ser: 0.79 mg/dL (ref 0.44–1.00)
GFR calc non Af Amer: >60 mL/min (ref >60)
Glucose, Bld: 95 mg/dL (ref 70–99)
POTASSIUM: 3.5 mmol/L (ref 3.5–5.1)
SODIUM: 140 mmol/L (ref 135–145)

## 2017-11-29 LAB — CBC
HEMATOCRIT: 41.9 % (ref 36.0–46.0)
HEMOGLOBIN: 13.5 g/dL (ref 12.0–15.0)
MCH: 30.8 pg (ref 26.0–34.0)
MCHC: 32.2 g/dL (ref 30.0–36.0)
MCV: 95.4 fL (ref 78.0–100.0)
Platelets: 290 10*3/uL (ref 150–400)
RBC: 4.39 MIL/uL (ref 3.87–5.11)
RDW: 13.7 % (ref 11.5–15.5)
WBC: 13.6 10*3/uL — AB (ref 4.0–10.5)

## 2017-11-29 NOTE — ED Triage Notes (Signed)
Pt presents with UTI like symptoms with pelvic pain and pain with urination, lower abd pain, body aches and chills; pt also states she is to have a scheduled procedure done tomorrow and they told her that she couldn't have it done if she isnt feeling well so she came in tonight

## 2017-11-30 ENCOUNTER — Encounter (HOSPITAL_COMMUNITY): Payer: Self-pay | Admitting: Emergency Medicine

## 2017-11-30 ENCOUNTER — Emergency Department (HOSPITAL_COMMUNITY): Payer: PPO

## 2017-11-30 ENCOUNTER — Encounter: Payer: Self-pay | Admitting: Gastroenterology

## 2017-11-30 ENCOUNTER — Telehealth: Payer: Self-pay | Admitting: Gastroenterology

## 2017-11-30 DIAGNOSIS — N200 Calculus of kidney: Secondary | ICD-10-CM | POA: Diagnosis not present

## 2017-11-30 MED ORDER — IBUPROFEN 800 MG PO TABS
800.0000 mg | ORAL_TABLET | Freq: Once | ORAL | Status: AC
Start: 2017-11-30 — End: 2017-11-30
  Administered 2017-11-30: 800 mg via ORAL
  Filled 2017-11-30: qty 1

## 2017-11-30 MED ORDER — LIDOCAINE 5 % EX PTCH: 1.0000 | MEDICATED_PATCH | CUTANEOUS | 0 refills | Status: DC

## 2017-11-30 MED ORDER — PHENAZOPYRIDINE HCL 100 MG PO TABS
200.0000 mg | ORAL_TABLET | Freq: Once | ORAL | Status: AC
  Administered 2017-11-30: 200 mg via ORAL
  Filled 2017-11-30: qty 2

## 2017-11-30 NOTE — ED Provider Notes (Signed)
[facility] EMERGENCY DEPARTMENT Provider Note   CSN: [id number] Arrival date & time: 11/29/17  2135     History   Chief Complaint Chief Complaint  Patient presents with  . Dysuria  . Generalized Body Aches  . Chills    HPI [name] is a 67 y.o. female.  The history is provided by the patient.  Dysuria   This is a new problem. The current episode started more than 1 week ago. The problem occurs every urination. The problem has not changed since onset.The quality of the pain is described as burning. The pain is moderate. There has been no fever. She is not sexually active. Pertinent negatives include no chills, no sweats, no nausea, no vomiting, no discharge, no frequency, no hematuria, no hesitancy, no possible pregnancy and no urgency. Associated symptoms comments: Chronic B flank pain. She has tried nothing for the symptoms. Her past medical history does not include kidney stones, single kidney or recurrent UTIs.    Past Medical History:  Diagnosis Date  . Abnormal EKG 2012   hospitalized for T wave inversion in lateral leads with MSK chest pains, normal ECHO, negative trops, no cardiology consult. recent EKG 7/15 stable T wave inversions.   . 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 Arthritis 2000  . Arthritis 2011  . Heart murmur   . Hyperlipidemia 2011  . Hypertension 2011  . Meniscus tear 2000   L KNEE    Patient Active Problem List   Diagnosis Date Noted  . Osteopenia 09/05/2017  . 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 HLD (hyperlipidemia)   . Aortic atherosclerosis ([place])   . Right-sided low back pain without sciatica 07/08/2015  . 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 Bilateral leg pain 06/14/2015  . Seasonal allergies 03/09/2015  . Hypopigmentation 03/09/2015  . Osteoarthritis of left knee 07/08/2014  . DJD (degenerative joint disease), lumbar 07/08/2014  . Osteoarthritis of right  knee 07/08/2014  . Healthcare maintenance 06/11/2014  . Allergic rhinoconjunctivitis of both eyes 06/11/2014  . Nearsightedness 04/27/2014  . Abnormal EKG   . Hypertension 12/11/2013    Past Surgical History:  Procedure Laterality Date  . ABDOMINAL SURGERY  2010   for bowel blockage  . COLON SURGERY  2010   bowel blockage  . COLONOSCOPY  09/2014  . KNEE ARTHROSCOPY Left 09/25/2013   Procedure: LEFT KNEE ARTHROSCOPY WITH PARTIAL MEDIAL AND LATERAL  MENISCECTOMY/DEBRIDEMENT/MICRO FRACTURE MEDIAL FEMORAL CONDYLE, REMOVAL OF OSTEOCONDRAL FRAGMENT;  Surgeon: Johnn Hai, MD;  Location: [facility] ORS;  Service: Orthopedics;  Laterality: Left;  . KNEE SURGERY Right 1999  . TEE WITHOUT CARDIOVERSION N/A 10/04/2015   Procedure: TRANSESOPHAGEAL ECHOCARDIOGRAM (TEE);  Surgeon: Sueanne Margarita, MD;  Location: [facility] ENDOSCOPY;  Service: Cardiovascular;  Laterality: N/A;  . TUBAL LIGATION  1978     OB History   None      Home Medications    Prior to Admission medications   Medication Sig Start Date End Date Taking? Authorizing Provider  amLODipine (NORVASC) 10 MG tablet Take 1 tablet (10 mg total) by mouth daily. 03/12/17   Clent Demark, PA-C  aspirin EC 81 MG tablet Take 1 tablet (81 mg total) daily by mouth. 01/31/17   Clent Demark, PA-C  atorvastatin (LIPITOR) 40 MG tablet TAKE 1 TABLET(40 MG) BY MOUTH DAILY AT 6 PM 11/09/17   Caroline More, DO  carvedilol (COREG) 12.5 MG tablet TAKE 1 TABLET(12.5  MG) BY MOUTH TWICE DAILY WITH A MEAL 03/12/17   Clent Demark, PA-C  celecoxib (CELEBREX) 200 MG capsule Take 1 capsule by mouth daily. 10/22/17   [provider]  diclofenac sodium (VOLTAREN) 1 % GEL Apply 2 g topically 4 (four) times daily. 10/26/17   Khatri, Hina, PA-C  hydrochlorothiazide (HYDRODIURIL) 25 MG tablet Take 1 tablet (25 mg total) by mouth daily. Take on tablet in the morning. 03/12/17   Clent Demark, PA-C  hydrocortisone cream 1 % Apply to affected area 2  times daily 10/26/17   Khatri, Hina, PA-C  Multiple Vitamin (MULITIVITAMIN WITH MINERALS) TABS Take 1 tablet by mouth daily.    [provider]  potassium chloride SA (K-DUR,KLOR-CON) 20 MEQ tablet TAKE 1 TABLET(20 MEQ) BY MOUTH DAILY 10/16/17   Caroline More, DO    Family History Family History  Problem Relation Age of Onset  . Hypertension Mother   . Heart disease Mother        has a pacemaker   . Alzheimer's disease Mother   . Hyperlipidemia Mother   . Osteoporosis Mother   . Heart failure Father   . Hypertension Father   . Alzheimer's disease Father   . Hypertension Sister   . Hypertension Sister   . Gout Sister   . Alcohol abuse Son   . Pancreatitis Son   . Cancer Neg Hx   . Colon cancer Neg Hx   . Esophageal cancer Neg Hx   . Stomach cancer Neg Hx   . Rectal cancer Neg Hx     Social History Social History   Tobacco Use  . Smoking status: Former Smoker    Last attempt to quit: 07/29/2010    Years since quitting: 7.3  . Smokeless tobacco: Never Used  Substance Use Topics  . Alcohol use: Never    Alcohol/week: 0.0 standard drinks    Frequency: Never  . Drug use: Never     Allergies   Shrimp [shellfish allergy]; Latex; and Penicillins   Review of Systems Review of Systems  Constitutional: Negative for chills.  Eyes: Negative for photophobia.  Gastrointestinal: Negative for nausea and vomiting.  Genitourinary: Positive for dysuria. Negative for frequency, hematuria, hesitancy and urgency.  All other systems reviewed and are negative.    Physical Exam Updated Vital Signs BP (!) 151/69 (BP Location: Right Arm)   Pulse 83   Temp 97.7 F (36.5 C) (Oral)   Resp 18   Ht 5\' 5"  (1.651 m)   Wt 77.6 kg   SpO2 100%   BMI 28.46 kg/m   Physical Exam  Constitutional: She is oriented to person, place, and time. She appears well-developed and well-nourished. No distress.  HENT:  Head: Normocephalic and atraumatic.  Mouth/Throat: No oropharyngeal  exudate.  Eyes: Pupils are equal, round, and reactive to light. Conjunctivae are normal.  Neck: Normal range of motion. Neck supple.  Cardiovascular: Normal rate, regular rhythm, normal heart sounds and intact distal pulses.  Pulmonary/Chest: Effort normal and breath sounds normal. No stridor. She has no wheezes. She has no rales.  Abdominal: Soft. Bowel sounds are normal. She exhibits no mass. There is no tenderness. There is no rebound and no guarding.  Musculoskeletal: Normal range of motion.  Neurological: She is alert and oriented to person, place, and time. She displays normal reflexes.  Skin: Skin is warm and dry. Capillary refill takes less than 2 seconds.  Psychiatric: She has a normal mood and affect.     ED  Treatments / Results  Labs (all labs ordered are listed, but only abnormal results are displayed) Results for orders placed or performed during the hospital encounter of 11/29/17  Urinalysis, Routine w reflex microscopic- may I&O cath if menses  Result Value Ref Range   Color, Urine YELLOW YELLOW   APPearance CLEAR CLEAR   Specific Gravity, Urine 1.011 1.005 - 1.030   pH 6.0 5.0 - 8.0   Glucose, UA NEGATIVE NEGATIVE mg/dL   Hgb urine dipstick SMALL (A) NEGATIVE   Bilirubin Urine NEGATIVE NEGATIVE   Ketones, ur NEGATIVE NEGATIVE mg/dL   Protein, ur NEGATIVE NEGATIVE mg/dL   Nitrite NEGATIVE NEGATIVE   Leukocytes, UA NEGATIVE NEGATIVE   RBC / HPF 0-5 0 - 5 RBC/hpf   WBC, UA 0-5 0 - 5 WBC/hpf   Bacteria, UA NONE SEEN NONE SEEN   Squamous Epithelial / LPF 0-5 0 - 5   Mucus PRESENT   Basic metabolic panel  Result Value Ref Range   Sodium 140 135 - 145 mmol/L   Potassium 3.5 3.5 - 5.1 mmol/L   Chloride 103 98 - 111 mmol/L   CO2 24 22 - 32 mmol/L   Glucose, Bld 95 70 - 99 mg/dL   BUN 7 (L) 8 - 23 mg/dL   Creatinine, Ser 0.79 0.44 - 1.00 mg/dL   Calcium 9.4 8.9 - 10.3 mg/dL   GFR calc non Af Amer >60 >60 mL/min   GFR calc Af Amer >60 >60 mL/min   Anion gap 13 5 -  15  CBC  Result Value Ref Range   WBC 13.6 (H) 4.0 - 10.5 K/uL   RBC 4.39 3.87 - 5.11 MIL/uL   Hemoglobin 13.5 12.0 - 15.0 g/dL   HCT 41.9 36.0 - 46.0 %   MCV 95.4 78.0 - 100.0 fL   MCH 30.8 26.0 - 34.0 pg   MCHC 32.2 30.0 - 36.0 g/dL   RDW 13.7 11.5 - 15.5 %   Platelets 290 150 - 400 K/uL   Mm Digital Screening Bilateral  Result Date: 11/23/2017 CLINICAL DATA:  Screening. EXAM: DIGITAL SCREENING BILATERAL MAMMOGRAM WITH CAD COMPARISON:  Previous exam(s). ACR Breast Density Category b: There are scattered areas of fibroglandular density. FINDINGS: There are no findings suspicious for malignancy. Images were processed with CAD. IMPRESSION: No mammographic evidence of malignancy. A result letter of this screening mammogram will be mailed directly to the patient. RECOMMENDATION: Screening mammogram in one year. (Code:SM-B-01Y) BI-RADS CATEGORY  1: Negative. Electronically Signed   By: Lajean Manes M.D.   On: 11/23/2017 07:38   Ct Renal Stone Study  Result Date: 11/30/2017 CLINICAL DATA:  Urinary tract infection symptoms. Pelvic pain and pain with urination. Lower abdominal pain. Body aches and chills. EXAM: CT ABDOMEN AND PELVIS WITHOUT CONTRAST TECHNIQUE: Multidetector CT imaging of the abdomen and pelvis was performed following the standard protocol without IV contrast. COMPARISON:  09/14/2017 FINDINGS: Lower chest: Atelectasis in the lung bases. Small pericardial effusion or thickening. Hepatobiliary: No focal liver abnormality is seen. No gallstones, gallbladder wall thickening, or biliary dilatation. Pancreas: Unremarkable. No pancreatic ductal dilatation or surrounding inflammatory changes. Spleen: Normal in size without focal abnormality. Adrenals/Urinary Tract: No adrenal gland nodules. Tiny 2 mm intrarenal stone in the midpole right kidney. No hydronephrosis or hydroureter. No ureteral stones or bladder stones. No bladder wall thickening. Stomach/Bowel: Stomach is within normal limits.  Appendix appears normal. No evidence of bowel wall thickening, distention, or inflammatory changes. Vascular/Lymphatic: Aortic atherosclerosis. No enlarged abdominal or pelvic  lymph nodes. Reproductive: Uterus and ovaries are not enlarged. Uterine calcifications suggesting fibroids. Other: No free air or free fluid in the abdomen. Periumbilical hernias containing fat. Mild fat stranding may indicate fat necrosis. Similar to previous study. Musculoskeletal: Lumbar scoliosis convex towards the right. Degenerative changes in the spine. No destructive bone lesions. IMPRESSION: 1. No evidence of bowel obstruction or inflammation. 2. Tiny nonobstructing stone in the right kidney. No ureteral stone or obstruction. 3. Small pericardial effusion or thickening. 4. Uterine fibroids. 5. Periumbilical hernias containing fat with possible fat necrosis, Similar to previous study. Aortic Atherosclerosis (ICD10-I70.0). Electronically Signed   By: Lucienne Capers M.D.   On: 11/30/2017 00:47    None  Radiology Ct Renal Stone Study  Result Date: 11/30/2017 CLINICAL DATA:  Urinary tract infection symptoms. Pelvic pain and pain with urination. Lower abdominal pain. Body aches and chills. EXAM: CT ABDOMEN AND PELVIS WITHOUT CONTRAST TECHNIQUE: Multidetector CT imaging of the abdomen and pelvis was performed following the standard protocol without IV contrast. COMPARISON:  09/14/2017 FINDINGS: Lower chest: Atelectasis in the lung bases. Small pericardial effusion or thickening. Hepatobiliary: No focal liver abnormality is seen. No gallstones, gallbladder wall thickening, or biliary dilatation. Pancreas: Unremarkable. No pancreatic ductal dilatation or surrounding inflammatory changes. Spleen: Normal in size without focal abnormality. Adrenals/Urinary Tract: No adrenal gland nodules. Tiny 2 mm intrarenal stone in the midpole right kidney. No hydronephrosis or hydroureter. No ureteral stones or bladder stones. No bladder wall  thickening. Stomach/Bowel: Stomach is within normal limits. Appendix appears normal. No evidence of bowel wall thickening, distention, or inflammatory changes. Vascular/Lymphatic: Aortic atherosclerosis. No enlarged abdominal or pelvic lymph nodes. Reproductive: Uterus and ovaries are not enlarged. Uterine calcifications suggesting fibroids. Other: No free air or free fluid in the abdomen. Periumbilical hernias containing fat. Mild fat stranding may indicate fat necrosis. Similar to previous study. Musculoskeletal: Lumbar scoliosis convex towards the right. Degenerative changes in the spine. No destructive bone lesions. IMPRESSION: 1. No evidence of bowel obstruction or inflammation. 2. Tiny nonobstructing stone in the right kidney. No ureteral stone or obstruction. 3. Small pericardial effusion or thickening. 4. Uterine fibroids. 5. Periumbilical hernias containing fat with possible fat necrosis, Similar to previous study. Aortic Atherosclerosis (ICD10-I70.0). Electronically Signed   By: Lucienne Capers M.D.   On: 11/30/2017 00:47    Procedures Procedures (including critical care time)  Medications Ordered in ED Medications  phenazopyridine (PYRIDIUM) tablet 200 mg (has no administration in time range)  ibuprofen (ADVIL,MOTRIN) tablet 800 mg (has no administration in time range)       Final Clinical Impressions(s) / ED Diagnoses    Return for fevers >100.4 unrelieved by medication, shortness of breath, intractable vomiting, or diarrhea, Inability to tolerate liquids or food, cough, altered mental status or any concerns. No signs of systemic illness or infection. The patient is nontoxic-appearing on exam and vital signs are within normal limits.   I have reviewed the triage vital signs and the nursing notes. Pertinent labs &imaging results that were available during my care of the patient were reviewed by me and considered in my medical decision making (see chart for details).  After  history, exam, and medical workup I feel the patient has been appropriately medically screened and is safe for discharge home. Pertinent diagnoses were discussed with the patient. Patient was given return precautions.     Mauria Asquith, MD 11/30/17 [phone]

## 2017-11-30 NOTE — Telephone Encounter (Signed)
No charge this time. 

## 2017-12-10 ENCOUNTER — Other Ambulatory Visit: Payer: Self-pay | Admitting: Pharmacist

## 2017-12-10 NOTE — Patient Outreach (Signed)
[facility]) Care Management  12/10/2017  [name] [birth year] [id number]   Incoming call from [name] in response to the [facility] Medication Adherence Campaign. Speak with patient. HIPAA identifiers verified and verbal consent received.  [name] reports that she takes her atorvastatin 40 mg daily as directed. Patient denies any missed doses or barriers to adherence. Counsel patient on the importance of adherence to this medication and timing of administration to aid with adherence. [name] verbalizes understanding.  Patient denies further medication questions/concerns at this time.  Will close pharmacy episode.  Harlow Asa, PharmD, [place] Management [phone]

## 2017-12-25 ENCOUNTER — Telehealth: Payer: Self-pay | Admitting: Gastroenterology

## 2017-12-25 MED ORDER — PEG 3350-KCL-NABCB-NACL-NASULF 236 G PO SOLR: 4000.0000 mL | Freq: Once | ORAL | 0 refills | Status: AC

## 2017-12-25 NOTE — Telephone Encounter (Signed)
Left a message letting patient know I resent her [place] to the pharmacy and to call back with any questions.

## 2017-12-27 ENCOUNTER — Other Ambulatory Visit: Payer: Self-pay

## 2017-12-27 ENCOUNTER — Ambulatory Visit (INDEPENDENT_AMBULATORY_CARE_PROVIDER_SITE_OTHER): Payer: PPO | Admitting: Family Medicine

## 2017-12-27 VITALS — BP 128/80 | HR 61 | Temp 98.3°F | Wt 170.8 lb

## 2017-12-27 DIAGNOSIS — K429 Umbilical hernia without obstruction or gangrene: Secondary | ICD-10-CM | POA: Diagnosis not present

## 2017-12-27 DIAGNOSIS — R1033 Periumbilical pain: Secondary | ICD-10-CM | POA: Diagnosis not present

## 2017-12-27 DIAGNOSIS — R300 Dysuria: Secondary | ICD-10-CM

## 2017-12-27 LAB — POCT URINALYSIS DIP (MANUAL ENTRY)
Bilirubin, UA: NEGATIVE
Blood, UA: NEGATIVE
Glucose, UA: NEGATIVE mg/dL
Ketones, POC UA: NEGATIVE mg/dL
LEUKOCYTES UA: NEGATIVE
Nitrite, UA: NEGATIVE
PROTEIN UA: NEGATIVE mg/dL
Spec Grav, UA: 1.015 (ref 1.010–1.025)
Urobilinogen, UA: 0.2 E.U./dL
pH, UA: 6 (ref 5.0–8.0)

## 2017-12-27 NOTE — Progress Notes (Addendum)
  Subjective:    Patient ID: [name], female    DOB: Nov 02, 1950, 67 y.o.   MRN: [id number]   CC: aching arms, stomach pain, tingling in arms, dysuria  HPI: Abdominal Pain: - Patient reports that this has been worsening for 2 days. Reports a cramping/ stabbing pain that starts in the middle of her stomach and radiates to her vagina. Denies any worsening pain with eating or drinking.  - No change in diet. Patient tried pepto bismol which did not help, last had some this morning - ROS: as above, no N/V, no fevers  Dysuria: - Patient reports pain with urination. Denies any blood in urine. No increased frequency. Reports abdominal pain, denies fevers  Smoking status reviewed  ROS: 10 point ROS is otherwise negative, except as mentioned in HPI  Patient Active Problem List   Diagnosis Date Noted  . Umbilical hernia without obstruction and without gangrene 01/01/2018  . Osteopenia 09/05/2017  . 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 HLD (hyperlipidemia)   . Aortic atherosclerosis ([place])   . Right-sided low back pain without sciatica 07/08/2015  . 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 Bilateral leg pain 06/14/2015  . Seasonal allergies 03/09/2015  . Hypopigmentation 03/09/2015  . Osteoarthritis of left knee 07/08/2014  . DJD (degenerative joint disease), lumbar 07/08/2014  . Osteoarthritis of right knee 07/08/2014  . Healthcare maintenance 06/11/2014  . Allergic rhinoconjunctivitis of both eyes 06/11/2014  . Nearsightedness 04/27/2014  . Abnormal EKG   . Hypertension 12/11/2013     Objective:  BP 128/80   Pulse 61   Temp 98.3 F (36.8 C) (Oral)   Wt 170 lb 12.8 oz (77.5 kg)   SpO2 97%   BMI 28.42 kg/m  Vitals and nursing note reviewed  General: NAD, pleasant Respiratory:normal effort Abdomen: soft, midline scar with reducible umbilical hernia, tender over hernia, nondistended Extremities: no edema or cyanosis.  WWP. Skin: warm and dry, no rashes noted Neuro: alert and oriented, no focal deficits Psych: normal affect  Assessment & Plan:    Umbilical hernia without obstruction and without gangrene Patient with reducible umbilical hernia likely also due to past incision. Could be causing patient's abdominal pain given tenderness on exam. Given strict return precautions including signs of incarceration. Could consider surgical evaluation if patient's discomfort persists.   Dysuria: UA with no signs of infection and urine culture with less than 10,000 colony forming units of bacteria per milliliter of urine   Scheduled follow up with PCP for patient's many complaints that could not be addressed today. Patient also needs repeat colonoscopy.   [place] Geni Skorupski, DO Family Medicine Resident PGY-2

## 2017-12-27 NOTE — Patient Instructions (Signed)
Thank you for coming to see me today. It was a pleasure! Today we talked about:   Your abdominal pain.  You have an umbilical hernia.  If this area becomes much much worse all of a sudden or it starts to bother be more frequently than please return to the office to have it evaluated.  Your burning when you pee.  We collected a urine sample today and will run test to see if you require any antibiotics for urinary tract infection.  We will call you with your results.  Please follow-up with your primary care provider on October 24 or sooner as needed.  If you have any questions or concerns, please do not hesitate to call the office at [phone].  Take Care,   [place] Brance Dartt, DO  Umbilical Hernia, Adult A hernia is a bulge of tissue that pushes through an opening between muscles. An umbilical hernia happens in the abdomen, near the belly button (umbilicus). The hernia may contain tissues from the small intestine, large intestine, or fatty tissue covering the intestines (omentum). Umbilical hernias in adults tend to get worse over time, and they require surgical treatment. There are several types of umbilical hernias. You may have:  A hernia located just above or below the umbilicus (indirect hernia). This is the most common type of umbilical hernia in adults.  A hernia that forms through an opening formed by the umbilicus (direct hernia).  A hernia that comes and goes (reducible hernia). A reducible hernia may be visible only when you strain, lift something heavy, or cough. This type of hernia can be pushed back into the abdomen (reduced).  A hernia that traps abdominal tissue inside the hernia (incarcerated hernia). This type of hernia cannot be reduced.  A hernia that cuts off blood flow to the tissues inside the hernia (strangulated hernia). The tissues can start to die if this happens. This type of hernia requires emergency treatment.  What are the causes? An umbilical hernia  happens when tissue inside the abdomen presses on a weak area of the abdominal muscles. What increases the risk? You may have a greater risk of this condition if you:  Are obese.  Have had several pregnancies.  Have a buildup of fluid inside your abdomen (ascites).  Have had surgery that weakens the abdominal muscles.  What are the signs or symptoms? The main symptom of this condition is a painless bulge at or near the belly button. A reducible hernia may be visible only when you strain, lift something heavy, or cough. Other symptoms may include:  Dull pain.  A feeling of pressure.  Symptoms of a strangulated hernia may include:  Pain that gets increasingly worse.  Nausea and vomiting.  Pain when pressing on the hernia.  Skin over the hernia becoming red or purple.  Constipation.  Blood in the stool.  How is this diagnosed? This condition may be diagnosed based on:  A physical exam. You may be asked to cough or strain while standing. These actions increase the pressure inside your abdomen and force the hernia through the opening in your muscles. Your health care provider may try to reduce the hernia by pressing on it.  Your symptoms and medical history.  How is this treated? Surgery is the only treatment for an umbilical hernia. Surgery for a strangulated hernia is done as soon as possible. If you have a small hernia that is not incarcerated, you may need to lose weight before having surgery. Follow these instructions  at home:  Lose weight, if told by your health care provider.  Do not try to push the hernia back in.  Watch your hernia for any changes in color or size. Tell your health care provider if any changes occur.  You may need to avoid activities that increase pressure on your hernia.  Do not lift anything that is heavier than 10 lb (4.5 kg) until your health care provider says that this is safe.  Take over-the-counter and prescription medicines only as  told by your health care provider.  Keep all follow-up visits as told by your health care provider. This is important. Contact a health care provider if:  Your hernia gets larger.  Your hernia becomes painful. Get help right away if:  You develop sudden, severe pain near the area of your hernia.  You have pain as well as nausea or vomiting.  You have pain and the skin over your hernia changes color.  You develop a fever. This information is not intended to replace advice given to you by your health care provider. Make sure you discuss any questions you have with your health care provider. Document Released: 08/13/2015 Document Revised: 11/14/2015 Document Reviewed: 08/13/2015 Elsevier Interactive Patient Education  [employer].

## 2017-12-29 LAB — URINE CULTURE

## 2018-01-01 ENCOUNTER — Ambulatory Visit (AMBULATORY_SURGERY_CENTER): Payer: PPO | Admitting: Gastroenterology

## 2018-01-01 ENCOUNTER — Encounter: Payer: Self-pay | Admitting: Gastroenterology

## 2018-01-01 VITALS — BP 153/64 | HR 53 | Temp 97.5°F | Resp 9

## 2018-01-01 DIAGNOSIS — D123 Benign neoplasm of transverse colon: Secondary | ICD-10-CM

## 2018-01-01 DIAGNOSIS — D124 Benign neoplasm of descending colon: Secondary | ICD-10-CM

## 2018-01-01 DIAGNOSIS — I10 Essential (primary) hypertension: Secondary | ICD-10-CM | POA: Diagnosis not present

## 2018-01-01 DIAGNOSIS — Z860101 Personal history of adenomatous and serrated colon polyps: Secondary | ICD-10-CM

## 2018-01-01 DIAGNOSIS — K429 Umbilical hernia without obstruction or gangrene: Secondary | ICD-10-CM | POA: Insufficient documentation

## 2018-01-01 DIAGNOSIS — Z86010 Personal history of colonic polyps: Secondary | ICD-10-CM

## 2018-01-01 DIAGNOSIS — E669 Obesity, unspecified: Secondary | ICD-10-CM | POA: Diagnosis not present

## 2018-01-01 MED ORDER — SODIUM CHLORIDE 0.9 % IV SOLN: 500.0000 mL | Freq: Once | INTRAVENOUS | Status: DC

## 2018-01-01 NOTE — Assessment & Plan Note (Signed)
Patient with reducible umbilical hernia likely also due to past incision. Could be causing patient's abdominal pain given tenderness on exam. Given strict return precautions including signs of incarceration. Could consider surgical evaluation if patient's discomfort persists.

## 2018-01-01 NOTE — Patient Instructions (Signed)
YOU HAD AN ENDOSCOPIC PROCEDURE TODAY AT THE [facility]:   Refer to the procedure report that was given to you for any specific questions about what was found during the examination.  If the procedure report does not answer your questions, please call your gastroenterologist to clarify.  If you requested that your care partner not be given the details of your procedure findings, then the procedure report has been included in a sealed envelope for you to review at your convenience later.  YOU SHOULD EXPECT: Some feelings of bloating in the abdomen. Passage of more gas than usual.  Walking can help get rid of the air that was put into your GI tract during the procedure and reduce the bloating. If you had a lower endoscopy (such as a colonoscopy or flexible sigmoidoscopy) you may notice spotting of blood in your stool or on the toilet paper. If you underwent a bowel prep for your procedure, you may not have a normal bowel movement for a few days.  Please Note:  You might notice some irritation and congestion in your nose or some drainage.  This is from the oxygen used during your procedure.  There is no need for concern and it should clear up in a day or so.  SYMPTOMS TO REPORT IMMEDIATELY:   Following lower endoscopy (colonoscopy or flexible sigmoidoscopy):  Excessive amounts of blood in the stool  Significant tenderness or worsening of abdominal pains  Swelling of the abdomen that is new, acute  Fever of 100F or higher  Please see handouts given to you on Polyps and Hemorrhoids.  For urgent or emergent issues, a gastroenterologist can be reached at any hour by calling [phone].   DIET:  We do recommend a small meal at first, but then you may proceed to your regular diet.  Drink plenty of fluids but you should avoid alcoholic beverages for 24 hours.  ACTIVITY:  You should plan to take it easy for the rest of today and you should NOT DRIVE or use heavy machinery until tomorrow  (because of the sedation medicines used during the test).    FOLLOW UP: Our staff will call the number listed on your records the next business day following your procedure to check on you and address any questions or concerns that you may have regarding the information given to you following your procedure. If we do not reach you, we will leave a message.  However, if you are feeling well and you are not experiencing any problems, there is no need to return our call.  We will assume that you have returned to your regular daily activities without incident.  If any biopsies were taken you will be contacted by phone or by letter within the next 1-3 weeks.  Please call us at [phone] if you have not heard about the biopsies in 3 weeks.    SIGNATURES/CONFIDENTIALITY: You and/or your care partner have signed paperwork which will be entered into your electronic medical record.  These signatures attest to the fact that that the information above on your After Visit Summary has been reviewed and is understood.  Full responsibility of the confidentiality of this discharge information lies with you and/or your care-partner.  Thank you for letting us take care of your healthcare needs today. 

## 2018-01-01 NOTE — Op Note (Signed)
[facility] Patient Name: [name] Procedure Date: 01/01/2018 3:43 PM MRN: [id number] Endoscopist: Ladene Artist , MD Age: 67 Referring MD:  Date of Birth: 12-17-1950 Gender: Female Account #: [id number] Procedure:                Colonoscopy Indications:              Surveillance: Personal history of adenomatous                            polyps on last colonoscopy 3 years ago Medicines:                Monitored Anesthesia Care Procedure:                Pre-Anesthesia Assessment:                           - Prior to the procedure, a History and Physical                            was performed, and patient medications and                            allergies were reviewed. The patient's tolerance of                            previous anesthesia was also reviewed. The risks                            and benefits of the procedure and the sedation                            options and risks were discussed with the patient.                            All questions were answered, and informed consent                            was obtained. Prior Anticoagulants: The patient has                            taken no previous anticoagulant or antiplatelet                            agents. ASA Grade Assessment: II - A patient with                            mild systemic disease. After reviewing the risks                            and benefits, the patient was deemed in                            satisfactory condition to undergo the procedure.  After obtaining informed consent, the colonoscope                            was passed under direct vision. Throughout the                            procedure, the patient's blood pressure, pulse, and                            oxygen saturations were monitored continuously. The                            Colonoscope was introduced through the anus and                            advanced to the the cecum,  identified by                            appendiceal orifice and ileocecal valve. The                            ileocecal valve, appendiceal orifice, and rectum                            were photographed. The quality of the bowel                            preparation was inadequate despite extensive lavage                            and suctioning. The colonoscopy was performed                            without difficulty. The patient tolerated the                            procedure well. Scope In: 3:55:03 PM Scope Out: 4:09:53 PM Scope Withdrawal Time: 0 hours 13 minutes 31 seconds  Total Procedure Duration: 0 hours 14 minutes 50 seconds  Findings:                 The perianal and digital rectal examinations were                            normal.                           Two sessile polyps were found in the descending                            colon and transverse colon. The polyps were 7 mm in                            size. These polyps were removed with a cold snare.  Resection and retrieval were complete.                           Internal hemorrhoids were found during                            retroflexion. The hemorrhoids were small and Grade                            I (internal hemorrhoids that do not prolapse).                           The exam was otherwise without abnormality on                            direct and retroflexion views. Complications:            No immediate complications. Estimated blood loss:                            None. Estimated Blood Loss:     Estimated blood loss: none. Impression:               - Preparation of the colon was inadequate.                           - Two 7 mm polyps in the descending colon and in                            the transverse colon, removed with a cold snare.                            Resected and retrieved.                           - Internal hemorrhoids.                            - The examination was otherwise normal on direct                            and retroflexion views. Recommendation:           - Repeat colonoscopy in 1 year because the bowel                            preparation was suboptimal with a more extenisve                            bowel prep.                           - Patient has a contact number available for                            emergencies. The signs and symptoms of potential  delayed complications were discussed with the                            patient. Return to normal activities tomorrow.                            Written discharge instructions were provided to the                            patient.                           - Resume previous diet.                           - Continue present medications.                           - Await pathology results. Ladene Artist, MD 01/01/2018 4:13:58 PM This report has been signed electronically.

## 2018-01-01 NOTE — Progress Notes (Signed)
Report to PACU, RN, vss, BBS= Clear.  

## 2018-01-01 NOTE — Progress Notes (Signed)
Called to room to assist during endoscopic procedure.  Patient ID and intended procedure confirmed with present staff. Received instructions for my participation in the procedure from the performing physician.  

## 2018-01-01 NOTE — Progress Notes (Signed)
Pt's states no medical or surgical changes since previsit or office visit. 

## 2018-01-02 ENCOUNTER — Telehealth: Payer: Self-pay

## 2018-01-02 NOTE — Telephone Encounter (Signed)
  Follow up Call-  Call back number 01/01/2018  Post procedure Call Back phone  # [phone]  Permission to leave phone message Yes  Some recent data might be hidden     Patient questions:  Do you have a fever, pain , or abdominal swelling? No. Pain Score  0 *  Have you tolerated food without any problems? Yes.    Have you been able to return to your normal activities? Yes.    Do you have any questions about your discharge instructions: Diet   No. Medications  No. Follow up visit  No.  Do you have questions or concerns about your Care? No.  Actions: * If pain score is 4 or above: No action needed, pain <4.

## 2018-01-16 ENCOUNTER — Encounter (HOSPITAL_COMMUNITY): Payer: Self-pay

## 2018-01-16 ENCOUNTER — Emergency Department (HOSPITAL_COMMUNITY)
Admission: EM | Admit: 2018-01-16 | Discharge: 2018-01-16 | Disposition: A | Discharge status: Home / Self Care | Payer: PPO | Attending: Emergency Medicine | Admitting: Emergency Medicine

## 2018-01-16 ENCOUNTER — Other Ambulatory Visit: Payer: Self-pay

## 2018-01-16 ENCOUNTER — Emergency Department (HOSPITAL_COMMUNITY): Payer: PPO

## 2018-01-16 DIAGNOSIS — R9389 Abnormal findings on diagnostic imaging of other specified body structures: Secondary | ICD-10-CM | POA: Insufficient documentation

## 2018-01-16 DIAGNOSIS — D259 Leiomyoma of uterus, unspecified: Secondary | ICD-10-CM | POA: Diagnosis not present

## 2018-01-16 DIAGNOSIS — N760 Acute vaginitis: Secondary | ICD-10-CM | POA: Diagnosis not present

## 2018-01-16 DIAGNOSIS — B9689 Other specified bacterial agents as the cause of diseases classified elsewhere: Secondary | ICD-10-CM | POA: Diagnosis not present

## 2018-01-16 DIAGNOSIS — Z79899 Other long term (current) drug therapy: Secondary | ICD-10-CM | POA: Insufficient documentation

## 2018-01-16 DIAGNOSIS — I10 Essential (primary) hypertension: Secondary | ICD-10-CM | POA: Diagnosis not present

## 2018-01-16 DIAGNOSIS — R140 Abdominal distension (gaseous): Secondary | ICD-10-CM | POA: Diagnosis not present

## 2018-01-16 DIAGNOSIS — G8929 Other chronic pain: Secondary | ICD-10-CM

## 2018-01-16 DIAGNOSIS — Z87891 Personal history of nicotine dependence: Secondary | ICD-10-CM | POA: Insufficient documentation

## 2018-01-16 DIAGNOSIS — Z789 Other specified health status: Secondary | ICD-10-CM

## 2018-01-16 DIAGNOSIS — R109 Unspecified abdominal pain: Secondary | ICD-10-CM | POA: Insufficient documentation

## 2018-01-16 DIAGNOSIS — Z7982 Long term (current) use of aspirin: Secondary | ICD-10-CM | POA: Insufficient documentation

## 2018-01-16 LAB — URINALYSIS, ROUTINE W REFLEX MICROSCOPIC
Bilirubin Urine: NEGATIVE
GLUCOSE, UA: NEGATIVE mg/dL
Hgb urine dipstick: NEGATIVE
Ketones, ur: NEGATIVE mg/dL
LEUKOCYTES UA: NEGATIVE
Nitrite: NEGATIVE
PH: 5 (ref 5.0–8.0)
Protein, ur: NEGATIVE mg/dL
Specific Gravity, Urine: 1.013 (ref 1.005–1.030)

## 2018-01-16 LAB — WET PREP, GENITAL
Sperm: NONE SEEN
TRICH WET PREP: NONE SEEN
WBC WET PREP: NONE SEEN
Yeast Wet Prep HPF POC: NONE SEEN

## 2018-01-16 LAB — LIPASE, BLOOD: Lipase: 31 U/L (ref 11–51)

## 2018-01-16 LAB — COMPREHENSIVE METABOLIC PANEL
ALT: 15 U/L (ref 0–44)
AST: 22 U/L (ref 15–41)
Albumin: 3.5 g/dL (ref 3.5–5.0)
Alkaline Phosphatase: 112 U/L (ref 38–126)
Anion gap: 7 (ref 5–15)
BUN: 10 mg/dL (ref 8–23)
CHLORIDE: 107 mmol/L (ref 98–111)
CO2: 26 mmol/L (ref 22–32)
CREATININE: 0.9 mg/dL (ref 0.44–1.00)
Calcium: 9 mg/dL (ref 8.9–10.3)
GFR calc non Af Amer: >60 mL/min (ref >60)
Glucose, Bld: 122 mg/dL — ABNORMAL HIGH (ref 70–99)
Potassium: 3.1 mmol/L — ABNORMAL LOW (ref 3.5–5.1)
Sodium: 140 mmol/L (ref 135–145)
Total Bilirubin: 0.7 mg/dL (ref 0.3–1.2)
Total Protein: 7 g/dL (ref 6.5–8.1)

## 2018-01-16 LAB — CBC
HEMATOCRIT: 40.5 % (ref 36.0–46.0)
HEMOGLOBIN: 12.6 g/dL (ref 12.0–15.0)
MCH: 30.1 pg (ref 26.0–34.0)
MCHC: 31.1 g/dL (ref 30.0–36.0)
MCV: 96.9 fL (ref 80.0–100.0)
NRBC: 0 % (ref 0.0–0.2)
Platelets: 307 10*3/uL (ref 150–400)
RBC: 4.18 MIL/uL (ref 3.87–5.11)
RDW: 13.5 % (ref 11.5–15.5)
WBC: 9.9 10*3/uL (ref 4.0–10.5)

## 2018-01-16 MED ORDER — OXYCODONE-ACETAMINOPHEN 5-325 MG PO TABS: 1.0000 | ORAL_TABLET | Freq: Three times a day (TID) | ORAL | 0 refills | Status: DC | PRN

## 2018-01-16 MED ORDER — ACETAMINOPHEN 500 MG PO TABS
1000.0000 mg | ORAL_TABLET | Freq: Once | ORAL | Status: AC
  Administered 2018-01-16: 1000 mg via ORAL
  Filled 2018-01-16: qty 2

## 2018-01-16 MED ORDER — METRONIDAZOLE 500 MG PO TABS: 500.0000 mg | ORAL_TABLET | Freq: Two times a day (BID) | ORAL | 0 refills | Status: DC

## 2018-01-16 NOTE — ED Triage Notes (Signed)
Pt c/o left "side pain" reports last BM this morning, reports hx of bowel blockage. C/O some nausea, denies vomiting.

## 2018-01-16 NOTE — Discharge Instructions (Addendum)
Thank you for allowing me to care for you today in the Emergency Department.   The ultrasound in the Emergency Department was abnormal Please call [phone] to schedule an appointment with an OB/GYN.  You will likely need a biopsy of the endometrium of your uterus.  Your pelvic exam is also consistent with bacterial vaginosis.  To treat this infection, take 1 tablet of metronidazole 2 times daily for the next 7 days.  Do not drink alcohol while taking this medication because it can cause severe vomiting.  For mild to moderate pain, you can take 650 mg of Tylenol every 6 hours.  For severe pain, you can take 1 tablet of Percocet every 8 hours.  Do not work or drive while taking Percocet because it can cause you to be impaired.  There is also a narcotic and can be addicting.  Each tablet of Percocet has 325 mg of Tylenol.  Do not take more than 4000 mg of Tylenol in a 24-hour period.  Return to the emergency department if you develop significantly worsening pain, persistent vomiting, high fever, vaginal bleeding, constipation, if you have stop passing gas, and or other new, concerning symptoms.

## 2018-01-16 NOTE — ED Provider Notes (Signed)
[facility] EMERGENCY DEPARTMENT Provider Note   CSN: [id number] Arrival date & time: 01/16/18  1232     History   Chief Complaint Chief Complaint  Patient presents with  . Abdominal Pain    HPI [name] is a 67 y.o. female with a history of HLD, HTN, aortic valve vegetation, and bowel blockage s/p surgery in 2010 who presents to the emergency department with a chief complaint of left flank pain and pelvic pain.  The patient endorses constant, non-radiating left flank pain that began approximately 3 weeks ago.  The pain is constant with intermittent sharp pain that is worse than the constant pain.  She is unable to identify any aggravating or alleviating factors, but states that sometimes when she stands to walk that the pain is so intense that it causes her to bend over.  She reports associated bilateral pelvic pain that has been constant since onset 2 days ago.  She reports associated nausea and vaginal itching.  She had a colonoscopy performed on 01/01/2018, but reports that the pain had been present prior to the colonoscopy.  She denies worsening symptoms the following the procedure.   She reports that she fractured her right distal tibia and proximal fibula in December 2018.  She has been using a cane since that time and underwent physical therapy.  She states that she has previously been evaluated by her PCP who felt that the pain may be secondary to her previous injury.  Reports she has ambulated with a cane since her injury.  She denies fever, chills, vomiting, diarrhea, rectal pain or bleeding, melena, hematochezia, dysuria, urinary frequency or hesitancy, vaginal bleeding, discharge or pain, hematuria, or rash.  States that she has not tried anything for her pain prior to arrival.  Last bowel movement was today, which was normal. She has been passing flatus.   She is a poor historian.   The history is provided by the patient. No language interpreter  was used.    Past Medical History:  Diagnosis Date  . Abnormal EKG 2012   hospitalized for T wave inversion in lateral leads with MSK chest pains, normal ECHO, negative trops, no cardiology consult. recent EKG 7/15 stable T wave inversions.   . 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 Arthritis 2000  . Arthritis 2011  . Heart murmur   . Hyperlipidemia 2011  . Hypertension 2011  . Meniscus tear 2000   L KNEE    Patient Active Problem List   Diagnosis Date Noted  . Umbilical hernia without obstruction and without gangrene 01/01/2018  . Osteopenia 09/05/2017  . 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 HLD (hyperlipidemia)   . Aortic atherosclerosis ([place])   . Right-sided low back pain without sciatica 07/08/2015  . 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 Bilateral leg pain 06/14/2015  . Seasonal allergies 03/09/2015  . Hypopigmentation 03/09/2015  . Osteoarthritis of left knee 07/08/2014  . DJD (degenerative joint disease), lumbar 07/08/2014  . Osteoarthritis of right knee 07/08/2014  . Healthcare maintenance 06/11/2014  . Allergic rhinoconjunctivitis of both eyes 06/11/2014  . Nearsightedness 04/27/2014  . Abnormal EKG   . Hypertension 12/11/2013    Past Surgical History:  Procedure Laterality Date  . ABDOMINAL SURGERY  2010   for bowel blockage  . COLON SURGERY  2010   bowel blockage  . COLONOSCOPY  09/2014  . KNEE ARTHROSCOPY Left 09/25/2013   Procedure: LEFT KNEE ARTHROSCOPY  WITH PARTIAL MEDIAL AND LATERAL  MENISCECTOMY/DEBRIDEMENT/MICRO FRACTURE MEDIAL FEMORAL CONDYLE, REMOVAL OF OSTEOCONDRAL FRAGMENT;  Surgeon: Johnn Hai, MD;  Location: [facility] ORS;  Service: Orthopedics;  Laterality: Left;  . KNEE SURGERY Right 1999  . TEE WITHOUT CARDIOVERSION N/A 10/04/2015   Procedure: TRANSESOPHAGEAL ECHOCARDIOGRAM (TEE);  Surgeon: Sueanne Margarita, MD;  Location: [facility] ENDOSCOPY;  Service: Cardiovascular;  Laterality: N/A;  . TUBAL  LIGATION  1978     OB History   None      Home Medications    Prior to Admission medications   Medication Sig Start Date End Date Taking? Authorizing Provider  amLODipine (NORVASC) 10 MG tablet Take 1 tablet (10 mg total) by mouth daily. 03/12/17  Yes Clent Demark, PA-C  aspirin EC 81 MG tablet Take 1 tablet (81 mg total) daily by mouth. 01/31/17  Yes Clent Demark, PA-C  atorvastatin (LIPITOR) 40 MG tablet TAKE 1 TABLET(40 MG) BY MOUTH DAILY AT 6 PM Patient taking differently: Take 40 mg by mouth daily at 6 PM.  11/09/17  Yes Tammi Klippel, Sherin, DO  carvedilol (COREG) 12.5 MG tablet TAKE 1 TABLET(12.5 MG) BY MOUTH TWICE DAILY WITH A MEAL Patient taking differently: Take 12.5 mg by mouth 2 (two) times daily with a meal.  03/12/17  Yes Clent Demark, PA-C  hydrochlorothiazide (HYDRODIURIL) 25 MG tablet Take 1 tablet (25 mg total) by mouth daily. Take on tablet in the morning. Patient taking differently: Take 25 mg by mouth daily.  03/12/17  Yes Clent Demark, PA-C  Multiple Vitamin (MULITIVITAMIN WITH MINERALS) TABS Take 1 tablet by mouth daily.   Yes [provider]  potassium chloride SA (K-DUR,KLOR-CON) 20 MEQ tablet TAKE 1 TABLET(20 MEQ) BY MOUTH DAILY Patient taking differently: Take 20 mEq by mouth daily.  10/16/17  Yes Tammi Klippel, Sherin, DO  metroNIDAZOLE (FLAGYL) 500 MG tablet Take 1 tablet (500 mg total) by mouth 2 (two) times daily. 01/16/18   Sharlyne Koeneman A, PA-C  oxyCODONE-acetaminophen (PERCOCET/ROXICET) 5-325 MG tablet Take 1 tablet by mouth every 8 (eight) hours as needed for severe pain. 01/16/18   Jeania Nater A, PA-C    Family History Family History  Problem Relation Age of Onset  . Hypertension Mother   . Heart disease Mother        has a pacemaker   . Alzheimer's disease Mother   . Hyperlipidemia Mother   . Osteoporosis Mother   . Heart failure Father   . Hypertension Father   . Alzheimer's disease Father   . Hypertension Sister   .  Hypertension Sister   . Gout Sister   . Alcohol abuse Son   . Pancreatitis Son   . Cancer Neg Hx   . Colon cancer Neg Hx   . Esophageal cancer Neg Hx   . Stomach cancer Neg Hx   . Rectal cancer Neg Hx     Social History Social History   Tobacco Use  . Smoking status: Former Smoker    Last attempt to quit: 07/29/2010    Years since quitting: 7.4  . Smokeless tobacco: Never Used  Substance Use Topics  . Alcohol use: Never    Alcohol/week: 0.0 standard drinks    Frequency: Never  . Drug use: Never     Allergies   Shrimp [shellfish allergy]; Latex; and Penicillins   Review of Systems Review of Systems  Constitutional: Negative for activity change, chills and fever.  HENT: Negative for congestion.   Respiratory: Negative for shortness of  breath.   Cardiovascular: Negative for chest pain.  Gastrointestinal: Negative for abdominal pain, diarrhea, nausea and vomiting.  Genitourinary: Positive for flank pain and pelvic pain. Negative for dysuria, hematuria, menstrual problem, vaginal bleeding and vaginal discharge.       Vaginal itching  Musculoskeletal: Negative for back pain.  Skin: Negative for rash.  Allergic/Immunologic: Negative for immunocompromised state.  Neurological: Negative for headaches.  Psychiatric/Behavioral: Negative for confusion.   Physical Exam Updated Vital Signs BP (!) 151/68 (BP Location: Right Arm)   Pulse 62   Temp 98.3 F (36.8 C) (Oral)   Resp 16   Ht 5\' 5"  (1.651 m)   Wt 77.1 kg   SpO2 100%   BMI 28.29 kg/m   Physical Exam  Constitutional: No distress.  HENT:  Head: Normocephalic.  Eyes: Conjunctivae are normal.  Neck: Neck supple.  Cardiovascular: Normal rate, regular rhythm, normal heart sounds and intact distal pulses. Exam reveals no gallop and no friction rub.  No murmur heard. Pulmonary/Chest: Effort normal. No stridor. No respiratory distress. She has no wheezes. She has no rales. She exhibits no tenderness.  Abdominal:  Soft. She exhibits distension. She exhibits no mass. Bowel sounds are increased. There is tenderness. There is no rebound and no guarding. No hernia.  Bilateral pelvic pain that is reproducible with palpation.  Abdomen is distended, but soft.  No other reproducible tenderness on exam.  No CVA tenderness bilaterally.  No rebound or guarding.  Hyperactive bowel sounds in all 4 quadrants.  Genitourinary: There is no rash, tenderness, lesion or injury on the right labia. There is no rash, tenderness, lesion or injury on the left labia. Cervix exhibits no friability. No erythema or bleeding in the vagina. No foreign body in the vagina. Vaginal discharge found.  Genitourinary Comments: Chaperoned exam. Thick yellow charges noted in the vaginal vault.  No cervical motion tenderness.  Patient denies adnexal tenderness, but grimaces palpation of the bilateral ovaries.   Musculoskeletal: She exhibits no tenderness.  No tenderness to the cervical, thoracic, or lumbar spinous processes or bilateral paraspinal muscles.  No tenderness to the bilateral SI joints, left hip, or left knee.  Ambulatory without difficulty.  Full active and passive range of motion of the left hip and knee.  She is neurovascularly intact.  Neurological: She is alert.  Skin: Skin is warm. Capillary refill takes less than 2 seconds. No rash noted.  Psychiatric: Her behavior is normal.  Nursing note and vitals reviewed.    ED Treatments / Results  Labs (all labs ordered are listed, but only abnormal results are displayed) Labs Reviewed  WET PREP, GENITAL - Abnormal; Notable for the following components:      Result Value   Clue Cells Wet Prep HPF POC PRESENT (*)    All other components within normal limits  COMPREHENSIVE METABOLIC PANEL - Abnormal; Notable for the following components:   Potassium 3.1 (*)    Glucose, Bld 122 (*)    All other components within normal limits  LIPASE, BLOOD  CBC  URINALYSIS, ROUTINE W REFLEX  MICROSCOPIC  GC/CHLAMYDIA PROBE AMP ([place]) NOT AT [facility]    EKG None  Radiology US Pelvic Complete With Transvaginal  Result Date: 01/16/2018 CLINICAL DATA:  Left pelvic and flank pain EXAM: TRANSABDOMINAL AND TRANSVAGINAL ULTRASOUND OF PELVIS TECHNIQUE: Both transabdominal and transvaginal ultrasound examinations of the pelvis were performed. Transabdominal technique was performed for global imaging of the pelvis including uterus, ovaries, adnexal regions, and pelvic cul-de-sac. It was necessary  to proceed with endovaginal exam following the transabdominal exam to visualize the uterus, endometrium, ovaries and adnexa. COMPARISON:  CT 11/30/2017 FINDINGS: Uterus Measurements: 6.1 x 4.7 x 3.9 cm, retroverted. Posterior calcified fibroid measures 1.9 cm. Endometrium Thickness: 11 mm in thickness, heterogeneous with vascularity. Right ovary Measurements: 1.1 x 1.6 x 0.8 cm. Normal appearance/no adnexal mass. Left ovary Measurements: 1.4 x 1.2 x 1.4 cm. Normal appearance/no adnexal mass. Other findings Small amount of free fluid in the cul-de-sac. IMPRESSION: Retroverted uterus.  1.9 cm calcified fibroid posteriorly. Thickened, heterogeneous, vascular endometrium. Endometrial thickness is considered abnormal for an asymptomatic post-menopausal female. Endometrial sampling should be considered to exclude carcinoma. Electronically Signed   By: Rolm Baptise M.D.   On: 01/16/2018 19:14    Procedures Procedures (including critical care time)  Medications Ordered in ED Medications  acetaminophen (TYLENOL) tablet 1,000 mg (1,000 mg Oral Given 01/16/18 1740)     Initial Impression / Assessment and Plan / ED Course  I have reviewed the triage vital signs and the nursing notes.  Pertinent labs & imaging results that were available during my care of the patient were reviewed by me and considered in my medical decision making (see chart for details).     67 year old female with a history of HLD,  HTN, aortic valve vegetation, and bowel blockage s/p surgery in 2010 resenting with chronic left flank pain and acute bilateral pelvic pain, onset 2 days ago.  She did have a colonoscopy 01/01/2018, but flank pain has not worsened since that time.  She also endorses vaginal itching, but no urinary symptoms.  She is a poor historian.  The patient was discussed and independently evaluated by Dr. Regenia Skeeter, attending physician.  On exam, the patient has bilateral pelvic pain, but no rebound or guarding.  Labs are notable for mild hypokalemia of 3.1.  She has active bowel sounds in all 4 quadrants and last had a normal bowel movement this afternoon.  Doubt bowel obstruction at this time.  On pelvic exam, she does have discharge noted in the vaginal vault.  Wet prep is consistent with clue cells.  Will treat for bacterial vaginosis with metronidazole.  She also had some adnexal discomfort on exam, but no cervical motion tenderness.  Ultrasound with retroverted uterus and 1.9 cm calcified uterine fibroid as well as a thickened, heterogeneous, vascular endometrium.  Per chart review, the patient had a CT stone study performed on 11/30/2017, which demonstrated a tiny nonobstructing stone in the right kidney, but no left-sided or ureteral stones.  CT also showed a periumbilical hernia containing fat with possible fat necrosis, consistent with study from 09/14/2017.  However, this does not palpate correlate with the patient's symptoms today.  She is also been evaluated for the flank pain in the ED multiple times previously, but pelvic pain is new.  Since she is not having any other symptoms, low suspicion for diverticulitis, small or large bowel obstruction, UTI, pyelonephritis, appendicitis, cholecystitis, or ovarian torsion.  I also doubt symptoms are secondary to musculoskeletal strain as she had no lumbar tenderness or tenderness to the hip or knee with full active and passive range of motion.  These findings were  discussed with the patient.  Will discharge with metronidazole and follow-up to OB/GYN for endometrial biopsy given thickened endometrium and a postmenopausal female.  We will discharge the patient with a short course of pain medication.  I have also additionally work to coordinate her care by sending her PCP a message in epic to  close the communication loop. A 5-month prescription history query was performed using the [place] CSRS prior to discharge.   Strict return precautions given.  She is hemodynamically stable and in no acute distress.  She is safe for discharge home at this time with outpatient follow-up.  Final Clinical Impressions(s) / ED Diagnoses   Final diagnoses:  Chronic left flank pain  Thickened endometrium  Bacterial vaginosis    ED Discharge Orders         Ordered    oxyCODONE-acetaminophen (PERCOCET/ROXICET) 5-325 MG tablet  Every 8 hours PRN     01/16/18 2017    metroNIDAZOLE (FLAGYL) 500 MG tablet  2 times daily     01/16/18 2019           Anniah Glick A, PA-C 01/17/18 0251    Sherwood Gambler, MD 01/21/18 [phone]

## 2018-01-17 ENCOUNTER — Ambulatory Visit: Payer: PPO | Admitting: Family Medicine

## 2018-01-17 ENCOUNTER — Other Ambulatory Visit: Payer: Self-pay | Admitting: Family Medicine

## 2018-01-17 DIAGNOSIS — R9389 Abnormal findings on diagnostic imaging of other specified body structures: Secondary | ICD-10-CM

## 2018-01-17 LAB — GC/CHLAMYDIA PROBE AMP (~~LOC~~) NOT AT ARMC
Chlamydia: NEGATIVE
Neisseria Gonorrhea: NEGATIVE

## 2018-01-18 ENCOUNTER — Encounter (HOSPITAL_COMMUNITY): Payer: Self-pay | Admitting: Emergency Medicine

## 2018-01-21 ENCOUNTER — Encounter: Payer: Self-pay | Admitting: Gastroenterology

## 2018-02-07 ENCOUNTER — Encounter: Payer: Self-pay | Admitting: Obstetrics and Gynecology

## 2018-02-07 ENCOUNTER — Ambulatory Visit (INDEPENDENT_AMBULATORY_CARE_PROVIDER_SITE_OTHER): Payer: PPO | Admitting: Obstetrics and Gynecology

## 2018-02-07 VITALS — BP 146/75 | HR 70 | Wt 170.0 lb

## 2018-02-07 DIAGNOSIS — R9389 Abnormal findings on diagnostic imaging of other specified body structures: Secondary | ICD-10-CM | POA: Diagnosis not present

## 2018-02-07 DIAGNOSIS — N882 Stricture and stenosis of cervix uteri: Secondary | ICD-10-CM | POA: Diagnosis not present

## 2018-02-07 MED ORDER — MISOPROSTOL 200 MCG PO TABS: ORAL_TABLET | ORAL | 1 refills | Status: DC

## 2018-02-07 NOTE — Progress Notes (Signed)
67 yo presenting today as an ED follow up. Patient was evaluated in 12/2017 for flank and pelvic pain. A pelvic ultrasound demonstrated a thickened endometrium. Patient has been menopausal for over 15 years. She denies any episode of vaginal bleeding. Patient was recently treated for BV. Patient denies any pelvic pain but reports persistent left flank pain.  Past Medical History:  Diagnosis Date  . Abnormal EKG 2012   hospitalized for T wave inversion in lateral leads with MSK chest pains, normal ECHO, negative trops, no cardiology consult. recent EKG 7/15 stable T wave inversions.   . 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 Arthritis 2000  . Arthritis 2011  . Heart murmur   . Hyperlipidemia 2011  . Hypertension 2011  . Meniscus tear 2000   L KNEE   Past Surgical History:  Procedure Laterality Date  . ABDOMINAL SURGERY  2010   for bowel blockage  . COLON SURGERY  2010   bowel blockage  . COLONOSCOPY  09/2014  . KNEE ARTHROSCOPY Left 09/25/2013   Procedure: LEFT KNEE ARTHROSCOPY WITH PARTIAL MEDIAL AND LATERAL  MENISCECTOMY/DEBRIDEMENT/MICRO FRACTURE MEDIAL FEMORAL CONDYLE, REMOVAL OF OSTEOCONDRAL FRAGMENT;  Surgeon: Johnn Hai, MD;  Location: [facility] ORS;  Service: Orthopedics;  Laterality: Left;  . KNEE SURGERY Right 1999  . TEE WITHOUT CARDIOVERSION N/A 10/04/2015   Procedure: TRANSESOPHAGEAL ECHOCARDIOGRAM (TEE);  Surgeon: Sueanne Margarita, MD;  Location: [facility] ENDOSCOPY;  Service: Cardiovascular;  Laterality: N/A;  . TUBAL LIGATION  1978   Family History  Problem Relation Age of Onset  . Hypertension Mother   . Heart disease Mother        has a pacemaker   . Alzheimer's disease Mother   . Hyperlipidemia Mother   . Osteoporosis Mother   . Heart failure Father   . Hypertension Father   . Alzheimer's disease Father   . Hypertension Sister   . Hypertension Sister   . Gout Sister   . Alcohol abuse Son   . Pancreatitis Son   . Cancer Neg Hx   . Colon cancer Neg Hx   . Esophageal  cancer Neg Hx   . Stomach cancer Neg Hx   . Rectal cancer Neg Hx    Social History   Tobacco Use  . Smoking status: Former Smoker    Last attempt to quit: 07/29/2010    Years since quitting: 7.5  . Smokeless tobacco: Never Used  Substance Use Topics  . Alcohol use: Never    Alcohol/week: 0.0 standard drinks    Frequency: Never  . Drug use: Never   ROS See pertinent in HPI  Blood pressure (!) 146/75, pulse 70, weight 170 lb (77.1 kg).  GENERAL: Well-developed, well-nourished female in no acute distress.  ABDOMEN: Soft, nontender, nondistended. No organomegaly. PELVIC: Normal external female genitalia. Vagina is pale and atrophic.  Normal discharge. Normal appearing cervix. Uterus is normal in size. No adnexal mass or tenderness. EXTREMITIES: No cyanosis, clubbing, or edema, 2+ distal pulses.  Mm Digital Screening Bilateral  Result Date: 11/23/2017 CLINICAL DATA:  Screening. EXAM: DIGITAL SCREENING BILATERAL MAMMOGRAM WITH CAD COMPARISON:  Previous exam(s). ACR Breast Density Category b: There are scattered areas of fibroglandular density. FINDINGS: There are no findings suspicious for malignancy. Images were processed with CAD. IMPRESSION: No mammographic evidence of malignancy. A result letter of this screening mammogram will be mailed directly to the patient. RECOMMENDATION: Screening mammogram in one year. (Code:SM-B-01Y) BI-RADS CATEGORY  1: Negative. Electronically Signed   By: Dedra Skeens.D.  On: 11/23/2017 07:38   Ct Renal Stone Study  Result Date: 11/30/2017 CLINICAL DATA:  Urinary tract infection symptoms. Pelvic pain and pain with urination. Lower abdominal pain. Body aches and chills. EXAM: CT ABDOMEN AND PELVIS WITHOUT CONTRAST TECHNIQUE: Multidetector CT imaging of the abdomen and pelvis was performed following the standard protocol without IV contrast. COMPARISON:  09/14/2017 FINDINGS: Lower chest: Atelectasis in the lung bases. Small pericardial effusion or  thickening. Hepatobiliary: No focal liver abnormality is seen. No gallstones, gallbladder wall thickening, or biliary dilatation. Pancreas: Unremarkable. No pancreatic ductal dilatation or surrounding inflammatory changes. Spleen: Normal in size without focal abnormality. Adrenals/Urinary Tract: No adrenal gland nodules. Tiny 2 mm intrarenal stone in the midpole right kidney. No hydronephrosis or hydroureter. No ureteral stones or bladder stones. No bladder wall thickening. Stomach/Bowel: Stomach is within normal limits. Appendix appears normal. No evidence of bowel wall thickening, distention, or inflammatory changes. Vascular/Lymphatic: Aortic atherosclerosis. No enlarged abdominal or pelvic lymph nodes. Reproductive: Uterus and ovaries are not enlarged. Uterine calcifications suggesting fibroids. Other: No free air or free fluid in the abdomen. Periumbilical hernias containing fat. Mild fat stranding may indicate fat necrosis. Similar to previous study. Musculoskeletal: Lumbar scoliosis convex towards the right. Degenerative changes in the spine. No destructive bone lesions. IMPRESSION: 1. No evidence of bowel obstruction or inflammation. 2. Tiny nonobstructing stone in the right kidney. No ureteral stone or obstruction. 3. Small pericardial effusion or thickening. 4. Uterine fibroids. 5. Periumbilical hernias containing fat with possible fat necrosis, Similar to previous study. Aortic Atherosclerosis (ICD10-I70.0). Electronically Signed   By: Lucienne Capers M.D.   On: 11/30/2017 00:47   US Pelvic Complete With Transvaginal  Result Date: 01/16/2018 CLINICAL DATA:  Left pelvic and flank pain EXAM: TRANSABDOMINAL AND TRANSVAGINAL ULTRASOUND OF PELVIS TECHNIQUE: Both transabdominal and transvaginal ultrasound examinations of the pelvis were performed. Transabdominal technique was performed for global imaging of the pelvis including uterus, ovaries, adnexal regions, and pelvic cul-de-sac. It was necessary to  proceed with endovaginal exam following the transabdominal exam to visualize the uterus, endometrium, ovaries and adnexa. COMPARISON:  CT 11/30/2017 FINDINGS: Uterus Measurements: 6.1 x 4.7 x 3.9 cm, retroverted. Posterior calcified fibroid measures 1.9 cm. Endometrium Thickness: 11 mm in thickness, heterogeneous with vascularity. Right ovary Measurements: 1.1 x 1.6 x 0.8 cm. Normal appearance/no adnexal mass. Left ovary Measurements: 1.4 x 1.2 x 1.4 cm. Normal appearance/no adnexal mass. Other findings Small amount of free fluid in the cul-de-sac. IMPRESSION: Retroverted uterus.  1.9 cm calcified fibroid posteriorly. Thickened, heterogeneous, vascular endometrium. Endometrial thickness is considered abnormal for an asymptomatic post-menopausal female. Endometrial sampling should be considered to exclude carcinoma. Electronically Signed   By: Rolm Baptise M.D.   On: 01/16/2018 19:14    A/P 67 yo with incidental finding of thickened endometrium  - Discussed endometrial biopsy ENDOMETRIAL BIOPSY     The indications for endometrial biopsy were reviewed.   Risks of the biopsy including cramping, bleeding, infection, uterine perforation, inadequate specimen and need for additional procedures  were discussed. The patient states she understands and agrees to undergo procedure today. Consent was signed. Time out was performed.  A sterile speculum was placed in the patient's vagina and the cervix was prepped with Betadine. A single-toothed tenaculum was placed on the anterior lip of the cervix to stabilize it. The uterine cavity wasnot able to be entered to due cervical stenosis. Patient was instructed to return after administration of vaginal cytotec. The patient tolerated the procedure well.  Routine post-procedure instructions  were given to the patient.

## 2018-03-01 ENCOUNTER — Other Ambulatory Visit (INDEPENDENT_AMBULATORY_CARE_PROVIDER_SITE_OTHER): Payer: Self-pay | Admitting: Family Medicine

## 2018-03-01 DIAGNOSIS — I10 Essential (primary) hypertension: Secondary | ICD-10-CM

## 2018-03-06 ENCOUNTER — Ambulatory Visit (INDEPENDENT_AMBULATORY_CARE_PROVIDER_SITE_OTHER): Payer: PPO | Admitting: Obstetrics and Gynecology

## 2018-03-06 ENCOUNTER — Encounter: Payer: Self-pay | Admitting: Obstetrics and Gynecology

## 2018-03-06 VITALS — BP 139/75 | HR 74 | Wt 172.4 lb

## 2018-03-06 DIAGNOSIS — R9389 Abnormal findings on diagnostic imaging of other specified body structures: Secondary | ICD-10-CM

## 2018-03-06 NOTE — Progress Notes (Signed)
67 yo postmenopausal here for endometrial biopsy after pre-medication with cytotec. Patient was noted to have an incidental finding of thickened endometrium. Patient denies a history of postmenopausal vaginal bleeding  Past Medical History:  Diagnosis Date  . Abnormal EKG 2012   hospitalized for T wave inversion in lateral leads with [facility] chest pains, normal ECHO, negative trops, no cardiology consult. recent EKG 7/15 stable T wave inversions.   . 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 Arthritis 2000  . Arthritis 2011  . Heart murmur   . Hyperlipidemia 2011  . Hypertension 2011  . Meniscus tear 2000   L KNEE   Past Surgical History:  Procedure Laterality Date  . ABDOMINAL SURGERY  2010   for bowel blockage  . COLON SURGERY  2010   bowel blockage  . COLONOSCOPY  09/2014  . KNEE ARTHROSCOPY Left 09/25/2013   Procedure: LEFT KNEE ARTHROSCOPY WITH PARTIAL MEDIAL AND LATERAL  MENISCECTOMY/DEBRIDEMENT/MICRO FRACTURE MEDIAL FEMORAL CONDYLE, REMOVAL OF OSTEOCONDRAL FRAGMENT;  Surgeon: Johnn Hai, MD;  Location: [facility];  Service: Orthopedics;  Laterality: Left;  . KNEE SURGERY Right 1999  . TEE WITHOUT CARDIOVERSION N/A 10/04/2015   Procedure: TRANSESOPHAGEAL ECHOCARDIOGRAM (TEE);  Surgeon: Sueanne Margarita, MD;  Location: [facility] ENDOSCOPY;  Service: Cardiovascular;  Laterality: N/A;  . TUBAL LIGATION  1978   Family History  Problem Relation Age of Onset  . Hypertension Mother   . Heart disease Mother        has a pacemaker   . Alzheimer's disease Mother   . Hyperlipidemia Mother   . Osteoporosis Mother   . Heart failure Father   . Hypertension Father   . Alzheimer's disease Father   . Hypertension Sister   . Hypertension Sister   . Gout Sister   . Alcohol abuse Son   . Pancreatitis Son   . Cancer Neg Hx   . Colon cancer Neg Hx   . Esophageal cancer Neg Hx   . Stomach cancer Neg Hx   . Rectal cancer Neg Hx    Social History   Tobacco Use  . Smoking status: Former Smoker    Last  attempt to quit: 07/29/2010    Years since quitting: 7.6  . Smokeless tobacco: Never Used  Substance Use Topics  . Alcohol use: Never    Alcohol/week: 0.0 standard drinks    Frequency: Never  . Drug use: Never   ROS See pertinent in HPI  Blood pressure 139/75, pulse 74, weight 172 lb 6.4 oz (78.2 kg). GENERAL: Well-developed, well-nourished female in no acute distress.  PELVIC: Normal external female genitalia. Vagina is pink and atrophic.  Normal discharge. Normal appearing cervix. Uterus is normal in size. No adnexal mass or tenderness. EXTREMITIES: No cyanosis, clubbing, or edema, 2+ distal pulses.  A/P 67 yo with thickened endometrium here for endometrial biopsy - Endometrial biopsy attempted after cytotec failed due to stenotic os.  - Discussed performing a D&C with hysteroscopy  - Risks, benefits and alternatives were explained including but not limited to risks of bleeding, infection and damage to adjacent organs. Patient verbalized understanding.

## 2018-03-07 ENCOUNTER — Encounter (HOSPITAL_COMMUNITY): Payer: Self-pay

## 2018-03-12 ENCOUNTER — Encounter (HOSPITAL_COMMUNITY): Payer: Self-pay

## 2018-03-13 ENCOUNTER — Ambulatory Visit (HOSPITAL_COMMUNITY): Admission: RE | Admit: 2018-03-13 | Payer: PPO | Source: Home / Self Care | Admitting: Obstetrics and Gynecology

## 2018-03-13 SURGERY — DILATATION AND CURETTAGE /HYSTEROSCOPY
Anesthesia: Choice

## 2018-05-01 ENCOUNTER — Other Ambulatory Visit: Payer: Self-pay

## 2018-05-01 ENCOUNTER — Encounter (HOSPITAL_BASED_OUTPATIENT_CLINIC_OR_DEPARTMENT_OTHER): Payer: Self-pay | Admitting: *Deleted

## 2018-05-06 ENCOUNTER — Encounter (HOSPITAL_BASED_OUTPATIENT_CLINIC_OR_DEPARTMENT_OTHER)
Admission: RE | Admit: 2018-05-06 | Discharge: 2018-05-06 | Disposition: A | Discharge status: Home / Self Care | Payer: PPO | Source: Ambulatory Visit | Attending: Obstetrics and Gynecology | Admitting: Obstetrics and Gynecology

## 2018-05-06 DIAGNOSIS — Z01818 Encounter for other preprocedural examination: Secondary | ICD-10-CM

## 2018-05-06 DIAGNOSIS — Z880 Allergy status to penicillin: Secondary | ICD-10-CM | POA: Diagnosis not present

## 2018-05-06 DIAGNOSIS — I10 Essential (primary) hypertension: Secondary | ICD-10-CM | POA: Diagnosis not present

## 2018-05-06 DIAGNOSIS — R9389 Abnormal findings on diagnostic imaging of other specified body structures: Secondary | ICD-10-CM | POA: Diagnosis present

## 2018-05-06 DIAGNOSIS — Z79899 Other long term (current) drug therapy: Secondary | ICD-10-CM | POA: Diagnosis not present

## 2018-05-06 DIAGNOSIS — E785 Hyperlipidemia, unspecified: Secondary | ICD-10-CM | POA: Diagnosis not present

## 2018-05-06 DIAGNOSIS — M1990 Unspecified osteoarthritis, unspecified site: Secondary | ICD-10-CM | POA: Diagnosis not present

## 2018-05-06 DIAGNOSIS — Z91013 Allergy to seafood: Secondary | ICD-10-CM | POA: Diagnosis not present

## 2018-05-06 DIAGNOSIS — N858 Other specified noninflammatory disorders of uterus: Secondary | ICD-10-CM | POA: Diagnosis not present

## 2018-05-06 DIAGNOSIS — Z87891 Personal history of nicotine dependence: Secondary | ICD-10-CM | POA: Diagnosis not present

## 2018-05-06 DIAGNOSIS — Z780 Asymptomatic menopausal state: Secondary | ICD-10-CM | POA: Diagnosis not present

## 2018-05-06 DIAGNOSIS — Z7982 Long term (current) use of aspirin: Secondary | ICD-10-CM | POA: Diagnosis not present

## 2018-05-06 LAB — BASIC METABOLIC PANEL
Anion gap: 12 (ref 5–15)
BUN: 8 mg/dL (ref 8–23)
CO2: 28 mmol/L (ref 22–32)
Calcium: 9.1 mg/dL (ref 8.9–10.3)
Chloride: 99 mmol/L (ref 98–111)
Creatinine, Ser: 0.83 mg/dL (ref 0.44–1.00)
GFR calc Af Amer: >60 mL/min (ref >60)
GFR calc non Af Amer: >60 mL/min (ref >60)
Glucose, Bld: 112 mg/dL — ABNORMAL HIGH (ref 70–99)
Potassium: 3.6 mmol/L (ref 3.5–5.1)
Sodium: 139 mmol/L (ref 135–145)

## 2018-05-07 NOTE — H&P (Signed)
[name] is an 68 y.o. female postmenopausal here for scheduled dilatation and curettage with hysteroscopy for the evaluation of a thickened endometrium. Patient denies any history of postmenopausal vaginal bleeding. Thickened endometrium incidentally noted during the evaluation of flank pain. Patient failed office attempts of endometrial biopsy on 2 occasions.  Pertinent Gynecological History: Menses: post-menopausal Bleeding: none Contraception: none DES exposure: denies Blood transfusions: none Last mammogram: normal Date: 10/2017 Last pap: normal Date: 07/2015    Menstrual History: No LMP recorded. Patient is postmenopausal.    Past Medical History:  Diagnosis Date  . Abnormal EKG 2012   hospitalized for T wave inversion in lateral leads with MSK chest pains, normal ECHO, negative trops, no cardiology consult. recent EKG 7/15 stable T wave inversions.   . 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 Arthritis 2000  . Arthritis 2011  . Heart murmur   . Hyperlipidemia 2011  . Hypertension 2011  . Meniscus tear 2000   L KNEE  . Renal infarct [facility]) 2017    Past Surgical History:  Procedure Laterality Date  . ABDOMINAL SURGERY  2010   for bowel blockage  . COLON SURGERY  2010   bowel blockage  . COLONOSCOPY  09/2014  . KNEE ARTHROSCOPY Left 09/25/2013   Procedure: LEFT KNEE ARTHROSCOPY WITH PARTIAL MEDIAL AND LATERAL  MENISCECTOMY/DEBRIDEMENT/MICRO FRACTURE MEDIAL FEMORAL CONDYLE, REMOVAL OF OSTEOCONDRAL FRAGMENT;  Surgeon: Johnn Hai, MD;  Location: [facility];  Service: Orthopedics;  Laterality: Left;  . KNEE SURGERY Right 1999  . TEE WITHOUT CARDIOVERSION N/A 10/04/2015   Procedure: TRANSESOPHAGEAL ECHOCARDIOGRAM (TEE);  Surgeon: Sueanne Margarita, MD;  Location: [facility] ENDOSCOPY;  Service: Cardiovascular;  Laterality: N/A;  . TUBAL LIGATION  1978    Family History  Problem Relation Age of Onset  . Hypertension Mother   . Heart disease Mother        has a pacemaker   .  Alzheimer's disease Mother   . Hyperlipidemia Mother   . Osteoporosis Mother   . Heart failure Father   . Hypertension Father   . Alzheimer's disease Father   . Hypertension Sister   . Hypertension Sister   . Gout Sister   . Alcohol abuse Son   . Pancreatitis Son   . Cancer Neg Hx   . Colon cancer Neg Hx   . Esophageal cancer Neg Hx   . Stomach cancer Neg Hx   . Rectal cancer Neg Hx     Social History:  reports that she quit smoking about 7 years ago. She has never used smokeless tobacco. She reports that she does not drink alcohol or use drugs.  Allergies:  Latex Penicillin  Shellfish   Medications Prior to Admission  Medication Sig Dispense Refill Last Dose  . amLODipine (NORVASC) 10 MG tablet TAKE 1 TABLET BY MOUTH EVERY DAY 30 tablet 3 05/08/2018 at Unknown time  . aspirin EC 81 MG tablet Take 1 tablet (81 mg total) daily by mouth. 30 tablet 11 05/07/2018 at Unknown time  . atorvastatin (LIPITOR) 40 MG tablet TAKE 1 TABLET(40 MG) BY MOUTH DAILY AT 6 PM (Patient taking differently: Take 40 mg by mouth daily at 6 PM. ) 90 tablet 3 05/07/2018 at Unknown time  . carvedilol (COREG) 12.5 MG tablet TAKE 1 TABLET BY MOUTH TWICE DAILY WITH A MEAL 60 tablet 3 05/08/2018 at 0615  . hydrochlorothiazide (HYDRODIURIL) 25 MG tablet Take 1 tablet (25 mg total) by mouth daily. 30 tablet 3 05/07/2018 at Unknown time  .  Multiple Vitamin (MULITIVITAMIN WITH MINERALS) TABS Take 1 tablet by mouth daily.   05/07/2018 at Unknown time  . potassium chloride SA (K-DUR,KLOR-CON) 20 MEQ tablet TAKE 1 TABLET(20 MEQ) BY MOUTH DAILY (Patient taking differently: Take 20 mEq by mouth daily. ) 90 tablet 3 05/07/2018 at Unknown time    ROS See pertinent in HPI Blood pressure 129/61, pulse (!) 57, temperature 98.4 F (36.9 C), temperature source Oral, resp. rate 16, height 5\' 5"  (1.651 m), weight 78.1 kg, SpO2 100 %. Physical Exam GENERAL: Well-developed, well-nourished female in no acute distress.  LUNGS: Clear  to auscultation bilaterally.  HEART: Regular rate and rhythm. ABDOMEN: Soft, nontender, nondistended. No organomegaly. PELVIC: Deferred to OR EXTREMITIES: No cyanosis, clubbing, or edema, 2+ distal pulses.  No results found for this or any previous visit (from the past 24 hour(s)).  No results found.  Assessment/Plan: 68 yo postmenopausal female with thickened endometrium on ultrasound here for dilatation and curettage - Risks benefits and alternatives were explained including but not limited to risks of bleeding, infection, uterine perforation and damage to adjacent organs. Patient verbalized understanding and all questions were answered  [name] 05/08/2018, 8:36 AM

## 2018-05-07 NOTE — Anesthesia Preprocedure Evaluation (Addendum)
Anesthesia Evaluation  Patient identified by MRN, date of birth, ID band Patient awake    Reviewed: Allergy & Precautions, NPO status , Patient's Chart, lab work & pertinent test results  History of Anesthesia Complications Negative for: history of anesthetic complications  Airway Mallampati: II  TM Distance: >3 FB Neck ROM: Full    Dental  (+) Dental Advisory Given, Poor Dentition, Edentulous Upper, Missing,    Pulmonary former smoker,    Pulmonary exam normal breath sounds clear to auscultation       Cardiovascular hypertension, Pt. on home beta blockers and Pt. on medications Normal cardiovascular exam+ Valvular Problems/Murmurs  Rhythm:Regular Rate:Normal  Hx of vegetation on aortic valve, normal EF  EKG with T wave inversions, unchanged from previous   Neuro/Psych negative neurological ROS     GI/Hepatic negative GI ROS, Neg liver ROS,   Endo/Other  negative endocrine ROS  Renal/GU negative Renal ROS     Musculoskeletal negative musculoskeletal ROS (+) Arthritis ,   Abdominal   Peds  Hematology negative hematology ROS (+)   Anesthesia Other Findings Day of surgery medications reviewed with the patient.  Reproductive/Obstetrics                            Anesthesia Physical Anesthesia Plan  ASA: II  Anesthesia Plan: General   Post-op Pain Management:    Induction: Intravenous  PONV Risk Score and Plan: 3 and Treatment may vary due to age or medical condition, Ondansetron, Dexamethasone and Midazolam  Airway Management Planned: LMA  Additional Equipment:   Intra-op Plan:   Post-operative Plan: Extubation in OR  Informed Consent: I have reviewed the patients History and Physical, chart, labs and discussed the procedure including the risks, benefits and alternatives for the proposed anesthesia with the patient or authorized representative who has indicated his/her  understanding and acceptance.     Dental advisory given  Plan Discussed with: CRNA  Anesthesia Plan Comments:        Anesthesia Quick Evaluation

## 2018-05-08 ENCOUNTER — Ambulatory Visit (HOSPITAL_BASED_OUTPATIENT_CLINIC_OR_DEPARTMENT_OTHER)
Admission: RE | Admit: 2018-05-08 | Discharge: 2018-05-08 | Disposition: A | Discharge status: Home / Self Care | Payer: PPO | Attending: Obstetrics and Gynecology | Admitting: Obstetrics and Gynecology

## 2018-05-08 ENCOUNTER — Ambulatory Visit (HOSPITAL_BASED_OUTPATIENT_CLINIC_OR_DEPARTMENT_OTHER): Payer: PPO | Admitting: Anesthesiology

## 2018-05-08 ENCOUNTER — Other Ambulatory Visit: Payer: Self-pay

## 2018-05-08 ENCOUNTER — Encounter (HOSPITAL_BASED_OUTPATIENT_CLINIC_OR_DEPARTMENT_OTHER): Payer: Self-pay | Admitting: *Deleted

## 2018-05-08 ENCOUNTER — Encounter (HOSPITAL_BASED_OUTPATIENT_CLINIC_OR_DEPARTMENT_OTHER)
Admission: RE | Disposition: A | Discharge status: Home / Self Care | Payer: Self-pay | Source: Home / Self Care | Attending: Obstetrics and Gynecology

## 2018-05-08 DIAGNOSIS — I10 Essential (primary) hypertension: Secondary | ICD-10-CM | POA: Diagnosis not present

## 2018-05-08 DIAGNOSIS — Z880 Allergy status to penicillin: Secondary | ICD-10-CM | POA: Diagnosis not present

## 2018-05-08 DIAGNOSIS — Z87891 Personal history of nicotine dependence: Secondary | ICD-10-CM | POA: Diagnosis not present

## 2018-05-08 DIAGNOSIS — Z780 Asymptomatic menopausal state: Secondary | ICD-10-CM | POA: Diagnosis not present

## 2018-05-08 DIAGNOSIS — E785 Hyperlipidemia, unspecified: Secondary | ICD-10-CM | POA: Diagnosis not present

## 2018-05-08 DIAGNOSIS — N858 Other specified noninflammatory disorders of uterus: Secondary | ICD-10-CM | POA: Insufficient documentation

## 2018-05-08 DIAGNOSIS — R9389 Abnormal findings on diagnostic imaging of other specified body structures: Secondary | ICD-10-CM

## 2018-05-08 DIAGNOSIS — M1990 Unspecified osteoarthritis, unspecified site: Secondary | ICD-10-CM | POA: Diagnosis not present

## 2018-05-08 DIAGNOSIS — Z7982 Long term (current) use of aspirin: Secondary | ICD-10-CM | POA: Insufficient documentation

## 2018-05-08 DIAGNOSIS — Z91013 Allergy to seafood: Secondary | ICD-10-CM | POA: Insufficient documentation

## 2018-05-08 DIAGNOSIS — Z79899 Other long term (current) drug therapy: Secondary | ICD-10-CM | POA: Diagnosis not present

## 2018-05-08 HISTORY — DX: Ischemia and infarction of kidney: N28.0

## 2018-05-08 HISTORY — PX: HYSTEROSCOPY WITH D & C: SHX1775

## 2018-05-08 SURGERY — DILATATION AND CURETTAGE /HYSTEROSCOPY
Anesthesia: General | Site: Vagina

## 2018-05-08 MED ORDER — LIDOCAINE 2% (20 MG/ML) 5 ML SYRINGE
INTRAMUSCULAR | Status: AC
  Filled 2018-05-08: qty 5

## 2018-05-08 MED ORDER — DEXAMETHASONE SODIUM PHOSPHATE 10 MG/ML IJ SOLN
INTRAMUSCULAR | Status: AC
  Filled 2018-05-08: qty 1

## 2018-05-08 MED ORDER — SODIUM CHLORIDE 0.9 % IR SOLN
Status: DC | PRN
  Administered 2018-05-08: 1

## 2018-05-08 MED ORDER — PROPOFOL 10 MG/ML IV BOLUS
INTRAVENOUS | Status: DC | PRN
  Administered 2018-05-08: 150 mg via INTRAVENOUS

## 2018-05-08 MED ORDER — OXYCODONE HCL 5 MG/5ML PO SOLN: 5.0000 mg | Freq: Once | ORAL | Status: DC | PRN

## 2018-05-08 MED ORDER — BUPIVACAINE HCL (PF) 0.25 % IJ SOLN
INTRAMUSCULAR | Status: AC
  Filled 2018-05-08: qty 30

## 2018-05-08 MED ORDER — CHLOROPROCAINE HCL 1 % IJ SOLN
INTRAMUSCULAR | Status: DC | PRN
  Administered 2018-05-08: 10 mL

## 2018-05-08 MED ORDER — LACTATED RINGERS IV SOLN
INTRAVENOUS | Status: DC
  Administered 2018-05-08: 08:00:00 via INTRAVENOUS

## 2018-05-08 MED ORDER — LIDOCAINE HCL (PF) 1 % IJ SOLN
INTRAMUSCULAR | Status: AC
  Filled 2018-05-08: qty 30

## 2018-05-08 MED ORDER — ONDANSETRON HCL 4 MG/2ML IJ SOLN
INTRAMUSCULAR | Status: DC | PRN
  Administered 2018-05-08: 4 mg via INTRAVENOUS

## 2018-05-08 MED ORDER — ONDANSETRON HCL 4 MG/2ML IJ SOLN
INTRAMUSCULAR | Status: AC
  Filled 2018-05-08: qty 2

## 2018-05-08 MED ORDER — MIDAZOLAM HCL 2 MG/2ML IJ SOLN
INTRAMUSCULAR | Status: AC
  Filled 2018-05-08: qty 2

## 2018-05-08 MED ORDER — SILVER NITRATE-POT NITRATE 75-25 % EX MISC
CUTANEOUS | Status: AC
  Filled 2018-05-08: qty 1

## 2018-05-08 MED ORDER — FENTANYL CITRATE (PF) 100 MCG/2ML IJ SOLN: 25.0000 ug | INTRAMUSCULAR | Status: DC | PRN

## 2018-05-08 MED ORDER — LIDOCAINE 2% (20 MG/ML) 5 ML SYRINGE
INTRAMUSCULAR | Status: DC | PRN
  Administered 2018-05-08: 60 mg via INTRAVENOUS

## 2018-05-08 MED ORDER — MIDAZOLAM HCL 2 MG/2ML IJ SOLN: 1.0000 mg | INTRAMUSCULAR | Status: DC | PRN

## 2018-05-08 MED ORDER — BUPIVACAINE HCL (PF) 0.5 % IJ SOLN
INTRAMUSCULAR | Status: AC
  Filled 2018-05-08: qty 30

## 2018-05-08 MED ORDER — PROMETHAZINE HCL 25 MG/ML IJ SOLN: 6.2500 mg | INTRAMUSCULAR | Status: DC | PRN

## 2018-05-08 MED ORDER — IBUPROFEN 600 MG PO TABS: 600.0000 mg | ORAL_TABLET | Freq: Four times a day (QID) | ORAL | 3 refills | Status: DC | PRN

## 2018-05-08 MED ORDER — OXYCODONE-ACETAMINOPHEN 5-325 MG PO TABS: 1.0000 | ORAL_TABLET | Freq: Four times a day (QID) | ORAL | 0 refills | Status: DC | PRN

## 2018-05-08 MED ORDER — DEXAMETHASONE SODIUM PHOSPHATE 10 MG/ML IJ SOLN
INTRAMUSCULAR | Status: DC | PRN
  Administered 2018-05-08: 10 mg via INTRAVENOUS

## 2018-05-08 MED ORDER — ACETAMINOPHEN 10 MG/ML IV SOLN: 1000.0000 mg | Freq: Once | INTRAVENOUS | Status: DC | PRN

## 2018-05-08 MED ORDER — FENTANYL CITRATE (PF) 100 MCG/2ML IJ SOLN
INTRAMUSCULAR | Status: AC
  Filled 2018-05-08: qty 2

## 2018-05-08 MED ORDER — OXYCODONE HCL 5 MG PO TABS: 5.0000 mg | ORAL_TABLET | Freq: Once | ORAL | Status: DC | PRN

## 2018-05-08 MED ORDER — SCOPOLAMINE 1 MG/3DAYS TD PT72: 1.0000 | MEDICATED_PATCH | Freq: Once | TRANSDERMAL | Status: DC | PRN

## 2018-05-08 MED ORDER — PROPOFOL 500 MG/50ML IV EMUL
INTRAVENOUS | Status: AC
  Filled 2018-05-08: qty 50

## 2018-05-08 MED ORDER — FENTANYL CITRATE (PF) 100 MCG/2ML IJ SOLN
INTRAMUSCULAR | Status: DC | PRN
  Administered 2018-05-08 (×2): 50 ug via INTRAVENOUS

## 2018-05-08 MED ORDER — FENTANYL CITRATE (PF) 100 MCG/2ML IJ SOLN: 50.0000 ug | INTRAMUSCULAR | Status: DC | PRN

## 2018-05-08 MED ORDER — CHLOROPROCAINE HCL 1 % IJ SOLN
INTRAMUSCULAR | Status: AC
  Filled 2018-05-08: qty 60

## 2018-05-08 SURGICAL SUPPLY — 19 items
BRIEF STRETCH FOR OB PAD XXL (UNDERPADS AND DIAPERS) ×3 IMPLANT
CANISTER SUCT 3000ML PPV (MISCELLANEOUS) ×3 IMPLANT
CATH ROBINSON RED A/P 16FR (CATHETERS) ×3 IMPLANT
GLOVE BIOGEL PI IND STRL 6.5 (GLOVE) ×1 IMPLANT
GLOVE BIOGEL PI IND STRL 7.0 (GLOVE) ×1 IMPLANT
GLOVE BIOGEL PI IND STRL 7.5 (GLOVE) IMPLANT
GLOVE BIOGEL PI INDICATOR 6.5 (GLOVE) ×2
GLOVE BIOGEL PI INDICATOR 7.0 (GLOVE) ×2
GLOVE BIOGEL PI INDICATOR 7.5 (GLOVE) ×2
GLOVE SURG SS PI 6.0 STRL IVOR (GLOVE) ×3 IMPLANT
GLOVE SURG SS PI 7.0 STRL IVOR (GLOVE) ×2 IMPLANT
GOWN STRL REUS W/TWL LRG LVL3 (GOWN DISPOSABLE) ×6 IMPLANT
KIT PROCEDURE FLUENT (KITS) ×3 IMPLANT
PACK VAGINAL MINOR WOMEN LF (CUSTOM PROCEDURE TRAY) ×3 IMPLANT
PAD OB MATERNITY 4.3X12.25 (PERSONAL CARE ITEMS) ×3 IMPLANT
PAD PREP 24X48 CUFFED NSTRL (MISCELLANEOUS) ×3 IMPLANT
SEAL ROD LENS SCOPE MYOSURE (ABLATOR) ×2 IMPLANT
SLEEVE SCD COMPRESS KNEE MED (MISCELLANEOUS) ×3 IMPLANT
TOWEL GREEN STERILE FF (TOWEL DISPOSABLE) ×6 IMPLANT

## 2018-05-08 NOTE — Op Note (Signed)
PREOPERATIVE DIAGNOSIS:  Thickened endometrium in postmenopausal state. POSTOPERATIVE DIAGNOSIS: The same PROCEDURE: Hysteroscopy, Dilation and Curettage. SURGEON:  Dr. Mora Bellman   INDICATIONS: 68 y.o. here for scheduled surgery for thickened endometrium in postmenopausal state.   Risks of surgery were discussed with the patient including but not limited to: bleeding which may require transfusion; infection which may require antibiotics; injury to uterus or surrounding organs; intrauterine scarring which may impair future fertility; need for additional procedures including laparotomy or laparoscopy; and other postoperative/anesthesia complications. Written informed consent was obtained.    FINDINGS:  A 6 week size uterus.  Atrophic endometrium with small atrophic appearing endometrium.  Normal ostia bilaterally.  ANESTHESIA:   General, paracervical block. INTRAVENOUS FLUIDS:  500 ml of LR FLUID DEFICITS:  41ml of normal saline ESTIMATED BLOOD LOSS:  Less than 20 ml SPECIMENS: Endometrial curettings sent to pathology COMPLICATIONS:  None immediate.  PROCEDURE DETAILS:  The patient received intravenous antibiotics while in the preoperative area.  She was then taken to the operating room where general anesthesia was administered and was found to be adequate.  After an adequate timeout was performed, she was placed in the dorsal lithotomy position and examined; then prepped and draped in the sterile manner.   A speculum was then placed in the patient's vagina and a single tooth tenaculum was applied to the anterior lip of the cervix.   A paracervical block using 10 ml of 0.5% Marcaine was administered.  The cervix was sounded to 6 cm and dilated manually with Hagar dilators to accommodate the 5 mm diagnostic hysteroscope.  Once the cervix was dilated, the hysteroscope was inserted under direct visualization using saline as a suspension medium.  The uterine cavity was carefully examined, both ostia  were recognized, and atrophic endometrium with a small polyp was noted.   After further careful visualization of the uterine cavity, the hysteroscope was removed under direct visualization.  A sharp curettage was then performed to obtain a moderate amount of endometrial curettings.  The tenaculum was removed from the anterior lip of the cervix and the vaginal speculum was removed after noting good hemostasis.  The patient tolerated the procedure well and was taken to the recovery area awake, extubated and in stable condition.

## 2018-05-08 NOTE — Anesthesia Procedure Notes (Signed)
Procedure Name: LMA Insertion Date/Time: 05/08/2018 8:45 AM Performed by: Genelle Bal, CRNA Pre-anesthesia Checklist: Patient identified, Emergency Drugs available, Suction available and Patient being monitored Patient Re-evaluated:Patient Re-evaluated prior to induction Oxygen Delivery Method: Circle system utilized Preoxygenation: Pre-oxygenation with 100% oxygen Induction Type: IV induction Ventilation: Mask ventilation without difficulty LMA: LMA inserted LMA Size: 4.0 Number of attempts: 1 Airway Equipment and Method: Bite block Placement Confirmation: positive ETCO2 Tube secured with: Tape Dental Injury: Teeth and Oropharynx as per pre-operative assessment

## 2018-05-08 NOTE — Anesthesia Postprocedure Evaluation (Signed)
Anesthesia Post Note  Patient: [name]  Procedure(s) Performed: DILATATION AND CURETTAGE /HYSTEROSCOPY (N/A Vagina )     Patient location during evaluation: PACU Anesthesia Type: General Level of consciousness: awake and alert Pain management: pain level controlled Vital Signs Assessment: post-procedure vital signs reviewed and stable Respiratory status: spontaneous breathing, nonlabored ventilation and respiratory function stable Cardiovascular status: blood pressure returned to baseline and stable Postop Assessment: no apparent nausea or vomiting Anesthetic complications: no    Last Vitals:  Vitals:   05/08/18 0930 05/08/18 0949  BP: (!) 142/70   Pulse: 64   Resp: 13   Temp:    SpO2: 100% 100%    Last Pain:  Vitals:   05/08/18 0930  TempSrc:   PainSc: 0-No pain                 Brennan Bailey

## 2018-05-08 NOTE — Transfer of Care (Signed)
Immediate Anesthesia Transfer of Care Note  Patient: [name]  Procedure(s) Performed: DILATATION AND CURETTAGE /HYSTEROSCOPY (N/A Vagina )  Patient Location: PACU  Anesthesia Type:General  Level of Consciousness: awake, alert  and oriented  Airway & Oxygen Therapy: Patient Spontanous Breathing and Patient connected to face mask oxygen  Post-op Assessment: Report given to RN and Post -op Vital signs reviewed and stable  Post vital signs: Reviewed and stable  Last Vitals:  Vitals Value Taken Time  BP 145/73 05/08/2018  9:08 AM  Temp    Pulse 61 05/08/2018  9:10 AM  Resp 13 05/08/2018  9:10 AM  SpO2 100 % 05/08/2018  9:10 AM  Vitals shown include unvalidated device data.  Last Pain:  Vitals:   05/08/18 0719  TempSrc: Oral  PainSc: 0-No pain      Patients Stated Pain Goal: 3 (84/21/03 1281)  Complications: No apparent anesthesia complications

## 2018-05-08 NOTE — Discharge Instructions (Signed)
Dilation and Curettage or Vacuum Curettage, Care After These instructions give you information about caring for yourself after your procedure. Your doctor may also give you more specific instructions. Call your doctor if you have any problems or questions after your procedure. Follow these instructions at home: Activity  Do not drive or use heavy machinery while taking prescription pain medicine.  For 24 hours after your procedure, avoid driving.  Take short walks often, followed by rest periods. Ask your doctor what activities are safe for you. After one or two days, you may be able to return to your normal activities.  Do not lift anything that is heavier than 10 lb (4.5 kg) until your doctor approves.  For at least 2 weeks, or as long as told by your doctor: ? Do not douche. ? Do not use tampons. ? Do not have sex. General instructions   Take over-the-counter and prescription medicines only as told by your doctor. This is very important if you take blood thinning medicine.  Do not take baths, swim, or use a hot tub until your doctor approves. Take showers instead of baths.  Wear compression stockings as told by your doctor.  It is up to you to get the results of your procedure. Ask your doctor when your results will be ready.  Keep all follow-up visits as told by your doctor. This is important. Contact a doctor if:  You have very bad cramps that get worse or do not get better with medicine.  You have very bad pain in your belly (abdomen).  You cannot drink fluids without throwing up (vomiting).  You get pain in a different part of the area between your belly and thighs (pelvis).  You have bad-smelling discharge from your vagina.  You have a rash. Get help right away if:  You are bleeding a lot from your vagina. A lot of bleeding means soaking more than one sanitary pad in an hour, for 2 hours in a row.  You have clumps of blood (blood clots) coming from your  vagina.  You have a fever or chills.  Your belly feels very tender or hard.  You have chest pain.  You have trouble breathing.  You cough up blood.  You feel dizzy.  You feel light-headed.  You pass out (faint).  You have pain in your neck or shoulder area. Summary  Take short walks often, followed by rest periods. Ask your doctor what activities are safe for you. After one or two days, you may be able to return to your normal activities.  Do not lift anything that is heavier than 10 lb (4.5 kg) until your doctor approves.  Do not take baths, swim, or use a hot tub until your doctor approves. Take showers instead of baths.  Contact your doctor if you have any symptoms of infection, like bad-smelling discharge from your vagina. This information is not intended to replace advice given to you by your health care provider. Make sure you discuss any questions you have with your health care provider. Document Released: 12/21/2007 Document Revised: 11/29/2015 Document Reviewed: 11/29/2015 Elsevier Interactive Patient Education  2019 [place] [facility] Home Care Instructions  Activity: Get plenty of rest for the remainder of the day. A responsible individual must stay with you for 24 hours following the procedure.  For the next 24 hours, DO NOT: -Drive a car -Paediatric nurse -Drink alcoholic beverages -Take any medication unless instructed by your physician -Make any legal  decisions or sign important papers.  Meals: Start with liquid foods such as gelatin or soup. Progress to regular foods as tolerated. Avoid greasy, spicy, heavy foods. If nausea and/or vomiting occur, drink only clear liquids until the nausea and/or vomiting subsides. Call your physician if vomiting continues.  Special Instructions/Symptoms: Your throat may feel dry or sore from the anesthesia or the breathing tube placed in your throat during surgery. If this causes discomfort, gargle with  warm salt water. The discomfort should disappear within 24 hours.  If you had a scopolamine patch placed behind your ear for the management of post- operative nausea and/or vomiting:  1. The medication in the patch is effective for 72 hours, after which it should be removed.  Wrap patch in a tissue and discard in the trash. Wash hands thoroughly with soap and water. 2. You may remove the patch earlier than 72 hours if you experience unpleasant side effects which may include dry mouth, dizziness or visual disturbances. 3. Avoid touching the patch. Wash your hands with soap and water after contact with the patch.

## 2018-05-09 ENCOUNTER — Encounter (HOSPITAL_BASED_OUTPATIENT_CLINIC_OR_DEPARTMENT_OTHER): Payer: Self-pay | Admitting: Obstetrics and Gynecology

## 2018-05-17 ENCOUNTER — Telehealth: Payer: Self-pay

## 2018-05-17 NOTE — Telephone Encounter (Signed)
Returned call. LVM for to c/b

## 2018-05-20 ENCOUNTER — Telehealth: Payer: Self-pay

## 2018-05-20 NOTE — Telephone Encounter (Signed)
Ezelle Surprenant Self [phone]   Ranetta is calling back to get the results of her Biopsy that was done on 05/08/2018. Please give her a call back.

## 2018-05-20 NOTE — Telephone Encounter (Signed)
Returned patient's call -  Advised Biopsy  Results:  ATROPHIC-APPEARING ENDOMETRIUM. - NO MALIGNANCY IDENTIFIED.  Patient is to keep follow up appt w/ Dr. Elly Modena on 05/27/2018 @ 10:15 am. Patient stated she understood and had no further questions at this time.

## 2018-05-22 ENCOUNTER — Other Ambulatory Visit: Payer: Self-pay | Admitting: Pharmacist

## 2018-05-22 NOTE — Patient Outreach (Signed)
[facility]) Care Management  [place] - Medication Adherence   05/22/2018  [name] 1951-03-02 [id number]  [facility] Medication: atorvastatin 40 mg Date & Supply of last refill: 03/14/18, 90 day supply Current insurance:[employer]   Outreach:  Incoming call from [name] in response to the [facility] Medication Adherence Campaign. Speak with patient. HIPAA identifiers verified.  Subjective:  [name] reports that she is continuing to take her atorvastatin 40 mg once daily as directed. Denies any missed doses or barriers to adherence. Counsel patient on the importance of adherence to the medication.  Patient denies any further medication questions/concerns at this time.    Objective: Lab Results  Component Value Date   CREATININE 0.83 05/06/2018   CREATININE 0.90 01/16/2018   CREATININE 0.79 11/29/2017    Lab Results  Component Value Date   HGBA1C 5.9 01/31/2017    Lipid Panel     Component Value Date/Time   CHOL 206 (H) 01/31/2017 1506   TRIG 149 01/31/2017 1506   HDL 66 01/31/2017 1506   CHOLHDL 3.1 01/31/2017 1506   CHOLHDL 4.7 10/01/2015 0732   VLDL 27 10/01/2015 0732   LDLCALC 110 (H) 01/31/2017 1506    BP Readings from Last 3 Encounters:  05/08/18 137/73  03/06/18 139/75  02/07/18 (!) 146/75    Allergies  Allergen Reactions  . Shrimp [Shellfish Allergy] Hives and Itching  . Latex Itching  . Penicillins Itching and Rash    Assessment:  . No current barriers identified   Plan:  Will close pharmacy episode at this time.  Harlow Asa, PharmD, [place] [facility] [phone]

## 2018-05-27 ENCOUNTER — Encounter: Payer: Self-pay | Admitting: Obstetrics and Gynecology

## 2018-05-27 ENCOUNTER — Other Ambulatory Visit (INDEPENDENT_AMBULATORY_CARE_PROVIDER_SITE_OTHER): Payer: Self-pay | Admitting: Family Medicine

## 2018-05-27 ENCOUNTER — Ambulatory Visit (INDEPENDENT_AMBULATORY_CARE_PROVIDER_SITE_OTHER): Payer: PPO | Admitting: Obstetrics and Gynecology

## 2018-05-27 VITALS — BP 151/80 | HR 84 | Wt 175.0 lb

## 2018-05-27 DIAGNOSIS — I10 Essential (primary) hypertension: Secondary | ICD-10-CM

## 2018-05-27 DIAGNOSIS — Z9889 Other specified postprocedural states: Secondary | ICD-10-CM | POA: Diagnosis not present

## 2018-05-27 NOTE — Progress Notes (Signed)
68 yo postmenopausal here for post op check s/p D&C hysteroscopy for the evaluation of a thickened endometrium on pelvic ultrasound. Patient reports feeling well. She denies any pelvic pain or vaginal bleeding. She reports her right sided pain has also resolved  Past Medical History:  Diagnosis Date  . Abnormal EKG 2012   hospitalized for T wave inversion in lateral leads with MSK chest pains, normal ECHO, negative trops, no cardiology consult. recent EKG 7/15 stable T wave inversions.   . 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 Arthritis 2000  . Arthritis 2011  . Heart murmur   . Hyperlipidemia 2011  . Hypertension 2011  . Meniscus tear 2000   L KNEE  . Renal infarct [facility]) 2017   Past Surgical History:  Procedure Laterality Date  . ABDOMINAL SURGERY  2010   for bowel blockage  . COLON SURGERY  2010   bowel blockage  . COLONOSCOPY  09/2014  . HYSTEROSCOPY W/D&C N/A 05/08/2018   Procedure: DILATATION AND CURETTAGE /HYSTEROSCOPY;  Surgeon: Mora Bellman, MD;  Location: [facility];  Service: Gynecology;  Laterality: N/A;  . KNEE ARTHROSCOPY Left 09/25/2013   Procedure: LEFT KNEE ARTHROSCOPY WITH PARTIAL MEDIAL AND LATERAL  MENISCECTOMY/DEBRIDEMENT/MICRO FRACTURE MEDIAL FEMORAL CONDYLE, REMOVAL OF OSTEOCONDRAL FRAGMENT;  Surgeon: Johnn Hai, MD;  Location: [facility];  Service: Orthopedics;  Laterality: Left;  . KNEE SURGERY Right 1999  . TEE WITHOUT CARDIOVERSION N/A 10/04/2015   Procedure: TRANSESOPHAGEAL ECHOCARDIOGRAM (TEE);  Surgeon: Sueanne Margarita, MD;  Location: [facility] ENDOSCOPY;  Service: Cardiovascular;  Laterality: N/A;  . TUBAL LIGATION  1978   Family History  Problem Relation Age of Onset  . Hypertension Mother   . Heart disease Mother        has a pacemaker   . Alzheimer's disease Mother   . Hyperlipidemia Mother   . Osteoporosis Mother   . Heart failure Father   . Hypertension Father   . Alzheimer's disease Father   . Hypertension Sister   . Hypertension  Sister   . Gout Sister   . Alcohol abuse Son   . Pancreatitis Son   . Cancer Neg Hx   . Colon cancer Neg Hx   . Esophageal cancer Neg Hx   . Stomach cancer Neg Hx   . Rectal cancer Neg Hx    Social History   Tobacco Use  . Smoking status: Former Smoker    Last attempt to quit: 07/29/2010    Years since quitting: 7.8  . Smokeless tobacco: Never Used  Substance Use Topics  . Alcohol use: Never    Alcohol/week: 0.0 standard drinks    Frequency: Never  . Drug use: Never   ROS See pertinent in HPI  Blood pressure (!) 151/80, pulse 84, weight 175 lb (79.4 kg). GENERAL: Well-developed, well-nourished female in no acute distress.  ABDOMEN: Soft, nontender, nondistended. No organomegaly. NEURO: alert and oriented x 3  A/P 68 yo s/p D&E  - pathology results reviewed with the patient - Mammogram due in August - RTC for annual exam here or with PCP

## 2018-07-06 NOTE — Telephone Encounter (Signed)
done

## 2018-07-31 ENCOUNTER — Ambulatory Visit (INDEPENDENT_AMBULATORY_CARE_PROVIDER_SITE_OTHER): Payer: PPO | Admitting: Family Medicine

## 2018-07-31 ENCOUNTER — Other Ambulatory Visit: Payer: Self-pay

## 2018-07-31 ENCOUNTER — Encounter: Payer: Self-pay | Admitting: Family Medicine

## 2018-07-31 VITALS — BP 146/62 | HR 70

## 2018-07-31 DIAGNOSIS — M25522 Pain in left elbow: Secondary | ICD-10-CM

## 2018-07-31 MED ORDER — IBUPROFEN 600 MG PO TABS: 600.0000 mg | ORAL_TABLET | Freq: Four times a day (QID) | ORAL | 3 refills | Status: DC | PRN

## 2018-07-31 NOTE — Progress Notes (Addendum)
   Subjective:   Patient ID: [name]    DOB: 05-03-1950, 68 y.o. female   MRN: [id number]  CC: left elbow pain   HPI: [name] is a 68 y.o. female who presents to clinic today for the following issue.  Left elbow pain  Patient reports falling about 2 hours ago in the parking lot of a fast food restaurant as there was a hole in the ground.  She recalls tripping and landing on her left side.  She did not hit her head but has significant left elbow pain.   She did not take any medicine for this yet but has been using tylenol and bengay for her chronic joint pain.  She occasionally uses a heating pad. She has mild swelling but is able to use her arm.  Denies numbness, tingling, weakness.     ROS: See HPI for pertinent ROS.  Social: pt is a former smoker.  Medications reviewed. Objective:   BP (!) 146/62   Pulse 70   SpO2 98%  Vitals and nursing note reviewed.  General: well nourished, well developed, NAD CV: RRR no MRG  Lungs: CTAB, normal effort  Skin: warm, dry Extremities: warm and well perfused  MSK: L elbow- normal appearance without bony abnormality, mild swelling over olecranon process, no overlying erythema or bruising, point tenderness over olecranon, normal ROM in all directions, strength 5/5 in bilateral upper extremities, normal sensation   Neuro: alert, oriented x3, no focal deficits   Assessment & Plan:   L elbow pain Likely 2/2 olecranon bursitis following recent fall. On exam she does not exhibit any weakness or neurovascular change.  Although low suspicion for fracture given full ROM and otherwise normal exam, will obtain plain films to r/o fracture of olecranon process. Advised to apply ice intermittently on and off, with use of Ibuprofen for pain.  Discussed activity modification and use of an elbow pad in the meantime.  Could consider PT if no improvement.  -Rx: Ibuprofen refill -DG L elbow complete -Follow up if symptoms worsen or do not improve    Orders Placed This Encounter  Procedures  . DG Elbow Complete Left    Standing Status:   Future    Standing Expiration Date:   09/30/2019    Order Specific Question:   Reason for Exam (SYMPTOM  OR DIAGNOSIS REQUIRED)    Answer:   L elbow pain    Order Specific Question:   Preferred imaging location?    Answer:   [facility]    Order Specific Question:   Radiology Contrast Protocol - do NOT remove file path    Answer:   \\[email or web address]   Meds ordered this encounter  Medications  . ibuprofen (ADVIL) 600 MG tablet    Sig: Take 1 tablet (600 mg total) by mouth every 6 (six) hours as needed.    Dispense:  60 tablet    Refill:  3    [name], MD [facility] PGY-3  Notified bt X ray that pt is there and also has swollen left ankle. Will order additional x ray

## 2018-08-02 ENCOUNTER — Other Ambulatory Visit (HOSPITAL_COMMUNITY): Payer: Self-pay | Admitting: Family Medicine

## 2018-08-02 ENCOUNTER — Ambulatory Visit (HOSPITAL_COMMUNITY)
Admission: RE | Admit: 2018-08-02 | Discharge: 2018-08-02 | Disposition: A | Discharge status: Home / Self Care | Payer: PPO | Source: Ambulatory Visit | Attending: Family Medicine | Admitting: Family Medicine

## 2018-08-02 ENCOUNTER — Other Ambulatory Visit: Payer: Self-pay

## 2018-08-02 ENCOUNTER — Other Ambulatory Visit: Payer: Self-pay | Admitting: Family Medicine

## 2018-08-02 DIAGNOSIS — M25522 Pain in left elbow: Secondary | ICD-10-CM | POA: Insufficient documentation

## 2018-08-02 DIAGNOSIS — S99912A Unspecified injury of left ankle, initial encounter: Secondary | ICD-10-CM | POA: Diagnosis not present

## 2018-08-02 DIAGNOSIS — S59902A Unspecified injury of left elbow, initial encounter: Secondary | ICD-10-CM | POA: Diagnosis not present

## 2018-08-02 DIAGNOSIS — M25572 Pain in left ankle and joints of left foot: Secondary | ICD-10-CM | POA: Diagnosis not present

## 2018-08-02 DIAGNOSIS — M7989 Other specified soft tissue disorders: Secondary | ICD-10-CM | POA: Diagnosis not present

## 2018-08-06 DIAGNOSIS — S93492A Sprain of other ligament of left ankle, initial encounter: Secondary | ICD-10-CM | POA: Diagnosis not present

## 2018-08-16 ENCOUNTER — Emergency Department (HOSPITAL_COMMUNITY)
Admission: EM | Admit: 2018-08-16 | Discharge: 2018-08-17 | Disposition: A | Discharge status: Home / Self Care | Payer: PPO | Attending: Emergency Medicine | Admitting: Emergency Medicine

## 2018-08-16 ENCOUNTER — Encounter (HOSPITAL_COMMUNITY): Payer: Self-pay

## 2018-08-16 DIAGNOSIS — R109 Unspecified abdominal pain: Secondary | ICD-10-CM

## 2018-08-16 DIAGNOSIS — Z7982 Long term (current) use of aspirin: Secondary | ICD-10-CM | POA: Diagnosis not present

## 2018-08-16 DIAGNOSIS — R1032 Left lower quadrant pain: Secondary | ICD-10-CM | POA: Diagnosis not present

## 2018-08-16 DIAGNOSIS — Z79899 Other long term (current) drug therapy: Secondary | ICD-10-CM | POA: Diagnosis not present

## 2018-08-16 DIAGNOSIS — I10 Essential (primary) hypertension: Secondary | ICD-10-CM | POA: Diagnosis not present

## 2018-08-16 DIAGNOSIS — Z87891 Personal history of nicotine dependence: Secondary | ICD-10-CM | POA: Diagnosis not present

## 2018-08-16 DIAGNOSIS — N200 Calculus of kidney: Secondary | ICD-10-CM | POA: Diagnosis not present

## 2018-08-16 LAB — URINALYSIS, ROUTINE W REFLEX MICROSCOPIC
Bilirubin Urine: NEGATIVE
Glucose, UA: NEGATIVE mg/dL
Hgb urine dipstick: NEGATIVE
Ketones, ur: NEGATIVE mg/dL
Leukocytes,Ua: NEGATIVE
Nitrite: NEGATIVE
Protein, ur: NEGATIVE mg/dL
Specific Gravity, Urine: 1.015 (ref 1.005–1.030)
pH: 5 (ref 5.0–8.0)

## 2018-08-16 NOTE — ED Triage Notes (Signed)
Pt states that she had a mechanical fall two weeks ago and since has had generalized pain all over, generalized swelling in bilateral ankles, L elbow pain, no fevers, pt also states that's that she thinks she has a UTI and vaginal itching.

## 2018-08-17 ENCOUNTER — Emergency Department (HOSPITAL_COMMUNITY): Payer: PPO

## 2018-08-17 DIAGNOSIS — N200 Calculus of kidney: Secondary | ICD-10-CM | POA: Diagnosis not present

## 2018-08-17 MED ORDER — KETOROLAC TROMETHAMINE 60 MG/2ML IM SOLN
30.0000 mg | Freq: Once | INTRAMUSCULAR | Status: AC
  Administered 2018-08-17: 30 mg via INTRAMUSCULAR
  Filled 2018-08-17: qty 2

## 2018-08-17 NOTE — ED Provider Notes (Signed)
[name]  Emergency Department Provider Note   I have reviewed the triage vital signs and the nursing notes.   HISTORY  Chief Complaint Multiple Complaints With multiple medical problems as documented below who presents to the emergency department today with multiple complaints.   Complains of persistent lower extremity swelling after a fall 2 weeks ago with negative x-rays and has Artie followed up with orthopedics we will not address that further.   She also complains of remittent episodes of a sharp pain that shoots down all of her legs couple times a day that is been going on for an unknown period of time.  She has no weakness or numbness in her legs.  She has no back pain. Triage note she states vaginal itching and dysuria but does not want to address that further at this point. He does seem that the main reason why she is here is that she is having some left-sided back pain.  States is been there for couple weeks but it does not seem to be associate with a fall.  She has history of renal infarct but is no longer on medication for it.  No shortness of breath or chest pain.  Does not seem to be exertional.  No rashes.  Did not hurt in the fall.  HPI [name] is a 67 y.o. female    No other associated or modifying symptoms.    Past Medical History:  Diagnosis Date  . Abnormal EKG 2012   hospitalized for T wave inversion in lateral leads with MSK chest pains, normal ECHO, negative trops, no cardiology consult. recent EKG 7/15 stable T wave inversions.   . 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 Arthritis 2000  . Arthritis 2011  . Heart murmur   . Hyperlipidemia 2011  . Hypertension 2011  . Meniscus tear 2000   L KNEE  . Renal infarct [facility]) 2017    Patient Active Problem List   Diagnosis Date Noted  . Thickened endometrium   . Umbilical hernia without obstruction and without gangrene 01/01/2018  . Osteopenia 09/05/2017  . 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 Renal  infarct ([place]) 09/30/2015  . HLD (hyperlipidemia)   . Aortic atherosclerosis ([place])   . Right-sided low back pain without sciatica 07/08/2015  . 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 Bilateral leg pain 06/14/2015  . Seasonal allergies 03/09/2015  . Hypopigmentation 03/09/2015  . Osteoarthritis of left knee 07/08/2014  . DJD (degenerative joint disease), lumbar 07/08/2014  . Osteoarthritis of right knee 07/08/2014  . Healthcare maintenance 06/11/2014  . Allergic rhinoconjunctivitis of both eyes 06/11/2014  . Nearsightedness 04/27/2014  . Abnormal EKG   . Hypertension 12/11/2013    Past Surgical History:  Procedure Laterality Date  . ABDOMINAL SURGERY  2010   for bowel blockage  . COLON SURGERY  2010   bowel blockage  . COLONOSCOPY  09/2014  . HYSTEROSCOPY W/D&C N/A 05/08/2018   Procedure: DILATATION AND CURETTAGE /HYSTEROSCOPY;  Surgeon: Mora Bellman, MD;  Location: [place];  Service: Gynecology;  Laterality: N/A;  . KNEE ARTHROSCOPY Left 09/25/2013   Procedure: LEFT KNEE ARTHROSCOPY WITH PARTIAL MEDIAL AND LATERAL  MENISCECTOMY/DEBRIDEMENT/MICRO FRACTURE MEDIAL FEMORAL CONDYLE, REMOVAL OF OSTEOCONDRAL FRAGMENT;  Surgeon: Johnn Hai, MD;  Location: [facility];  Service: Orthopedics;  Laterality: Left;  . KNEE SURGERY Right 1999  . TEE WITHOUT CARDIOVERSION N/A 10/04/2015   Procedure: TRANSESOPHAGEAL ECHOCARDIOGRAM (TEE);  Surgeon: Sueanne Margarita, MD;  Location: [facility]  ENDOSCOPY;  Service: Cardiovascular;  Laterality: N/A;  . TUBAL LIGATION  1978    Current Outpatient Rx  . Order #: [id number] Class: Normal  . Order #: [id number] Class: Normal  . Order #: [id number] Class: Normal  . Order #: [id number] Class: Normal  . Order #: [id number] Class: Normal  . Order #: [id number] Class: Normal  . Order #: [id number] Class: Historical Med  . Order #: [id number] Class: Normal  . Order #: [id number] Class: Normal    Allergies Shrimp [shellfish allergy]; Latex; and  Penicillins  Family History  Problem Relation Age of Onset  . Hypertension Mother   . Heart disease Mother        has a pacemaker   . Alzheimer's disease Mother   . Hyperlipidemia Mother   . Osteoporosis Mother   . Heart failure Father   . Hypertension Father   . Alzheimer's disease Father   . Hypertension Sister   . Hypertension Sister   . Gout Sister   . Alcohol abuse Son   . Pancreatitis Son   . Cancer Neg Hx   . Colon cancer Neg Hx   . Esophageal cancer Neg Hx   . Stomach cancer Neg Hx   . Rectal cancer Neg Hx     Social History Social History   Tobacco Use  . Smoking status: Former Smoker    Last attempt to quit: 07/29/2010    Years since quitting: 8.0  . Smokeless tobacco: Never Used  Substance Use Topics  . Alcohol use: Never    Alcohol/week: 0.0 standard drinks    Frequency: Never  . Drug use: Never    Review of Systems  All other systems negative except as documented in the HPI. All pertinent positives and negatives as reviewed in the HPI. ____________________________________________   PHYSICAL EXAM:  VITAL SIGNS: ED Triage Vitals [08/16/18 1953]  Enc Vitals Group     BP (!) 154/84     Pulse Rate 69     Resp 20     Temp (!) 97.5 F (36.4 C)     Temp Source Oral     SpO2 100 %    Constitutional: Alert and oriented. Well appearing and in no acute distress. Eyes: Conjunctivae are normal. PERRL. EOMI. Head: Atraumatic. Nose: No congestion/rhinnorhea. Mouth/Throat: Mucous membranes are moist.  Oropharynx non-erythematous. Neck: No stridor.  No meningeal signs.   Cardiovascular: Normal rate, regular rhythm. Good peripheral circulation. Grossly normal heart sounds.   Respiratory: Normal respiratory effort.  No retractions. Lungs CTAB. Gastrointestinal: Soft and nontender. No distention.  Musculoskeletal: No lower extremity tenderness nor edema. No gross deformities of extremities. Neurologic:  Normal speech and language. No gross focal neurologic  deficits are appreciated.  Skin:  Skin is warm, dry and intact. No rash noted.   ____________________________________________   LABS (all labs ordered are listed, but only abnormal results are displayed)  Labs Reviewed  URINE CULTURE  URINALYSIS, ROUTINE W REFLEX MICROSCOPIC   ____________________________________________  EKG   EKG Interpretation  Date/Time:  Saturday Aug 17 2018 01:56:00 EDT Ventricular Rate:  54 PR Interval:    QRS Duration: 92 QT Interval:  405 QTC Calculation: 384 R Axis:   63 Text Interpretation:  Sinus rhythm Abnormal T, consider ischemia, lateral leads very similar to february 10 Confirmed by Merrily Pew [phone]) on 08/17/2018 2:55:35 AM       ____________________________________________  RADIOLOGY  Ct Renal Stone Study  Result Date: 08/17/2018 CLINICAL DATA:  Flank pain EXAM: CT ABDOMEN AND PELVIS WITHOUT CONTRAST TECHNIQUE: Multidetector CT imaging  of the abdomen and pelvis was performed following the standard protocol without IV contrast. COMPARISON:  11/30/2017 FINDINGS: Lower chest: No acute abnormality. Hepatobiliary: No focal liver abnormality is seen. No gallstones, gallbladder wall thickening, or biliary dilatation. Pancreas: Unremarkable. No pancreatic ductal dilatation or surrounding inflammatory changes. Spleen: Normal in size without focal abnormality. Adrenals/Urinary Tract: Adrenal glands are within normal limits. Kidneys are well visualized bilaterally. Stable tiny right renal calculus is noted. Bladder is within normal limits. Stomach/Bowel: Stomach is within normal limits. Appendix appears normal. No evidence of bowel wall thickening, distention, or inflammatory changes. Vascular/Lymphatic: Aortic atherosclerosis. No enlarged abdominal or pelvic lymph nodes. Reproductive: Multiple calcified uterine fibroids are noted. No adnexal mass is seen. Other: No free fluid is noted. Small fat containing periumbilical hernias are seen.  Musculoskeletal: Degenerative changes of lumbar spine are noted. IMPRESSION: Tiny nonobstructing renal stone stable from the prior exam. No acute abnormality noted. Electronically Signed   By: Inez Catalina M.D.   On: 08/17/2018 02:10    ____________________________________________   PROCEDURES  Procedure(s) performed:   Procedures   ____________________________________________   INITIAL IMPRESSION / ASSESSMENT AND PLAN / ED COURSE  Ultimately patient seems to be here with left-sided pain.  Will check for kidney stone versus [place] versus cardiac causes.  Treat her symptoms.  If negative can be discharged follow-up with PCP.  Ultimately, workup unremarkable. Low suspicion for emergent cause for symptoms at this time, can continue workup as an outpatient.   Pertinent labs & imaging results that were available during my care of the patient were reviewed by me and considered in my medical decision making (see chart for details).  A medical screening exam was performed and I feel the patient has had an appropriate workup for their chief complaint at this time and likelihood of emergent condition existing is low. They have been counseled on decision, discharge, follow up and which symptoms necessitate immediate return to the emergency department. They or their family verbally stated understanding and agreement with plan and discharged in stable condition.   ____________________________________________  FINAL CLINICAL IMPRESSION(S) / ED DIAGNOSES  Final diagnoses:  Left flank pain    MEDICATIONS GIVEN DURING THIS VISIT:  Medications  ketorolac (TORADOL) injection 30 mg (30 mg Intramuscular Given 08/17/18 0146)    NEW OUTPATIENT MEDICATIONS STARTED DURING THIS VISIT:  Discharge Medication List as of 08/17/2018  2:21 AM      Note:  This note was prepared with assistance of Dragon voice recognition software. Occasional wrong-word or sound-a-like substitutions may have occurred due to  the inherent limitations of voice recognition software.   Ananth Fiallos, Corene Cornea, MD 08/17/18 [phone]

## 2018-08-17 NOTE — ED Notes (Signed)
Patient transported to MRI 

## 2018-08-18 ENCOUNTER — Other Ambulatory Visit (INDEPENDENT_AMBULATORY_CARE_PROVIDER_SITE_OTHER): Payer: Self-pay | Admitting: Family Medicine

## 2018-08-18 DIAGNOSIS — I10 Essential (primary) hypertension: Secondary | ICD-10-CM

## 2018-08-18 LAB — URINE CULTURE

## 2018-08-20 DIAGNOSIS — S93492D Sprain of other ligament of left ankle, subsequent encounter: Secondary | ICD-10-CM | POA: Diagnosis not present

## 2018-08-20 NOTE — Telephone Encounter (Signed)
Please have patient schedule follow up for HTN. She has not been seen for this problem since 2018 with previous PCP.   Dalphine Handing, PGY-2 [facility] 08/20/2018 12:12 PM

## 2018-08-27 ENCOUNTER — Ambulatory Visit (INDEPENDENT_AMBULATORY_CARE_PROVIDER_SITE_OTHER): Payer: PPO | Admitting: Family Medicine

## 2018-08-27 ENCOUNTER — Other Ambulatory Visit: Payer: Self-pay

## 2018-08-27 ENCOUNTER — Encounter: Payer: Self-pay | Admitting: Family Medicine

## 2018-08-27 VITALS — BP 156/72 | HR 77 | Wt 178.6 lb

## 2018-08-27 DIAGNOSIS — R600 Localized edema: Secondary | ICD-10-CM | POA: Diagnosis not present

## 2018-08-27 MED ORDER — IBUPROFEN 600 MG PO TABS: 600.0000 mg | ORAL_TABLET | Freq: Four times a day (QID) | ORAL | 3 refills | Status: DC | PRN

## 2018-08-27 NOTE — Patient Instructions (Signed)
It was a pleasure seeing you today.   Today we discussed your leg swelling  For your swelling: I have gotten labs. I will call with the results. Please use compression stockings, elevate your legs when able, and eat healthy (less salt) and daily exercise.   Please follow up in 1 month or sooner if symptoms persist or worsen. Please call the clinic immediately if you have any concerns.   Our clinic's number is [phone]. Please call with questions or concerns.   Please go to the emergency room if you have shortness of breath or chest pain  Thank you,  Caroline More, DO

## 2018-08-27 NOTE — Progress Notes (Signed)
   Subjective:    Patient ID: [name], female    DOB: 1951-01-23, 68 y.o.   MRN: [id number]   CC: bilateral LE edema   HPI: LE edema Patient coming with complaints of bilateral lower extremity edema.  Reports that this is been present for 2 months now.  States that it got worse after she stepped in a pothole but has been chronic from her ankles to her knees.  States that she has no other symptoms other than her elbows aching.  Denies any chest pain or shortness of breath.  Denies any orthopnea.  Denies any PND.  Has no history of heart failure.  Has been told she has a heart murmur.  Patient's diet is mostly salads and vegetables.  Patient does exercise daily with walking.   Objective:  BP (!) 156/72   Pulse 77   Wt 178 lb 9.6 oz (81 kg)   SpO2 99%   BMI 29.72 kg/m  Vitals and nursing note reviewed  General: well nourished, in no acute distress HEENT: normocephalic, no scleral icterus or conjunctival pallor, no nasal discharge, moist mucous membranes Neck: supple, non-tender, without lymphadenopathy Cardiac: RRR, clear S1 and S2, 1/6 systolic murmurs, rubs, or gallops Respiratory: clear to auscultation bilaterally, no increased work of breathing Abdomen: soft, nontender, nondistended, no masses or organomegaly. Bowel sounds present Extremities: trace pitting edema around ankles bilaterally, no pitting edema above ankles, no cyanosis. Warm, well perfused. 2+ radial and PT pulses bilaterally Skin: warm and dry, no rashes noted Neuro: alert and oriented, no focal deficits   Assessment & Plan:    Leg edema Bilateral lower extremity edema for over 2 months now.  Unclear etiology.  Less likely cardiac given that patient has no chest pain, shortness of breath, orthopnea, PND.  Patient has no abnormal EKGs on record.  Will need to rule out renal or hepatic etiology.  Will obtain CMP to do this.  We will also need to rule out anemia, will obtain CBC.  Can consider thyroid myxedema  however less likely given the patient has no thyroid history.  Will obtain TSH.  Have all lab work is normal can consider amlodipine as cause of lower extremity edema.  If labs are normal we will plan to discontinue Norvasc and start another blood pressure medicine.  If this still does not improve lower extremity edema can consider getting an echo for further work-up.  Follow up in 1 month.  Discussed this plan with Dr. Gwendlyn Deutscher.    Return in about 1 month (around 09/26/2018).   Caroline More, DO, PGY-2

## 2018-08-28 ENCOUNTER — Other Ambulatory Visit: Payer: Self-pay | Admitting: Family Medicine

## 2018-08-28 DIAGNOSIS — R600 Localized edema: Secondary | ICD-10-CM | POA: Insufficient documentation

## 2018-08-28 LAB — COMPREHENSIVE METABOLIC PANEL
ALT: 18 IU/L (ref 0–32)
AST: 21 IU/L (ref 0–40)
Albumin/Globulin Ratio: 1.4 (ref 1.2–2.2)
Albumin: 4.3 g/dL (ref 3.8–4.8)
Alkaline Phosphatase: 141 IU/L — ABNORMAL HIGH (ref 39–117)
BUN/Creatinine Ratio: 11 — ABNORMAL LOW (ref 12–28)
BUN: 8 mg/dL (ref 8–27)
Bilirubin Total: 0.5 mg/dL (ref 0.0–1.2)
CO2: 26 mmol/L (ref 20–29)
Calcium: 9.9 mg/dL (ref 8.7–10.3)
Chloride: 101 mmol/L (ref 96–106)
Creatinine, Ser: 0.76 mg/dL (ref 0.57–1.00)
GFR calc Af Amer: 93 mL/min/{1.73_m2} (ref >59)
GFR calc non Af Amer: 81 mL/min/{1.73_m2} (ref >59)
Globulin, Total: 3 g/dL (ref 1.5–4.5)
Glucose: 99 mg/dL (ref 65–99)
Potassium: 3.9 mmol/L (ref 3.5–5.2)
Sodium: 143 mmol/L (ref 134–144)
Total Protein: 7.3 g/dL (ref 6.0–8.5)

## 2018-08-28 LAB — CBC
Hematocrit: 39.4 % (ref 34.0–46.6)
Hemoglobin: 13.2 g/dL (ref 11.1–15.9)
MCH: 31.1 pg (ref 26.6–33.0)
MCHC: 33.5 g/dL (ref 31.5–35.7)
MCV: 93 fL (ref 79–97)
Platelets: 306 10*3/uL (ref 150–450)
RBC: 4.25 x10E6/uL (ref 3.77–5.28)
RDW: 13.1 % (ref 11.7–15.4)
WBC: 11.6 10*3/uL — ABNORMAL HIGH (ref 3.4–10.8)

## 2018-08-28 LAB — TSH+FREE T4
Free T4: 1.07 ng/dL (ref 0.82–1.77)
TSH: 1.29 u[IU]/mL (ref 0.450–4.500)

## 2018-08-28 MED ORDER — LOSARTAN POTASSIUM 25 MG PO TABS: 25.0000 mg | ORAL_TABLET | Freq: Every day | ORAL | 0 refills | Status: DC

## 2018-08-28 NOTE — Progress Notes (Signed)
Given that patient has lower extremity edema with normal labs will discontinue amlodipine as this is likely cause of edema.  Will start losartan at 25 mg.  Advised that patient nursing visit in 1 week to ensure blood pressure is within goal.  Can increase losartan if needed.  Patient should follow-up in 1 month for blood pressure check.  If improvement of edema with medication change can consider obtaining echo to rule out cardiac etiology.  Discussed this plan with patient over the phone who is agreeable.  Dalphine Handing, PGY-2 [facility] 08/28/2018 9:39 AM

## 2018-08-28 NOTE — Assessment & Plan Note (Addendum)
Bilateral lower extremity edema for over 2 months now.  Unclear etiology.  Less likely cardiac given that patient has no chest pain, shortness of breath, orthopnea, PND.  Patient has no abnormal EKGs on record.  Will need to rule out renal or hepatic etiology.  Will obtain CMP to do this.  We will also need to rule out anemia, will obtain CBC.  Can consider thyroid myxedema however less likely given the patient has no thyroid history.  Will obtain TSH.  Have all lab work is normal can consider amlodipine as cause of lower extremity edema.  If labs are normal we will plan to discontinue Norvasc and start another blood pressure medicine.  If this still does not improve lower extremity edema can consider getting an echo for further work-up.  Follow up in 1 month.  Discussed this plan with Dr. Gwendlyn Deutscher.

## 2018-10-04 ENCOUNTER — Encounter: Payer: Self-pay | Admitting: Family Medicine

## 2018-10-04 ENCOUNTER — Ambulatory Visit (INDEPENDENT_AMBULATORY_CARE_PROVIDER_SITE_OTHER): Payer: PPO | Admitting: Family Medicine

## 2018-10-04 ENCOUNTER — Other Ambulatory Visit: Payer: Self-pay

## 2018-10-04 VITALS — BP 138/72 | HR 62

## 2018-10-04 DIAGNOSIS — R300 Dysuria: Secondary | ICD-10-CM

## 2018-10-04 DIAGNOSIS — R102 Pelvic and perineal pain: Secondary | ICD-10-CM

## 2018-10-04 LAB — POCT URINALYSIS DIP (MANUAL ENTRY)
Bilirubin, UA: NEGATIVE
Blood, UA: NEGATIVE
Glucose, UA: NEGATIVE mg/dL
Ketones, POC UA: NEGATIVE mg/dL
Leukocytes, UA: NEGATIVE
Nitrite, UA: NEGATIVE
Protein Ur, POC: NEGATIVE mg/dL
Spec Grav, UA: 1.015 (ref 1.010–1.025)
Urobilinogen, UA: 0.2 E.U./dL
pH, UA: 5.5 (ref 5.0–8.0)

## 2018-10-04 MED ORDER — IBUPROFEN 600 MG PO TABS: 600.0000 mg | ORAL_TABLET | Freq: Three times a day (TID) | ORAL | 1 refills | Status: DC | PRN

## 2018-10-04 NOTE — Patient Instructions (Signed)
It was a pleasure seeing you today.   Today we discussed your pelvic pain  For your pain: Take ibuprofen as needed every 6 hours.  If the pain becomes worse this weekend please go to the emergency department.  Please make sure to follow-up with gynecology as I think this may be related to your endometrium.  Please call center for women to follow-up.  Please follow up in 1 month or sooner if symptoms persist or worsen. Please call the clinic immediately if you have any concerns.   Our clinic's number is [phone]. Please call with questions or concerns.   Please go to the emergency room if you have worsening pain, bleeding, back pain, fevers, or stopped having bowel movements.  Thank you,  Caroline More, DO

## 2018-10-04 NOTE — Progress Notes (Signed)
   Subjective:    Patient ID: [name], female    DOB: 18-Jun-1950, 68 y.o.   MRN: [id number]   CC: Pain  HPI: Pain Patient presenting for pelvic pain.  States that she has to walk bent over in the morning.  States that this is happened ever since her surgery back in February.  States that she has not had any vaginal bleeding or dysuria.  Thinks it is related to her surgery.  Denies any constipation, in fact she has had some diarrhea this week.  Only other symptom is elbow pain.   Objective:  BP 138/72   Pulse 62   SpO2 97%  Vitals and nursing note reviewed  General: well nourished, in no acute distress HEENT: normocephalic, PERRL, no scleral icterus or conjunctival pallor, no nasal discharge, moist mucous membranes  Cardiac: RRR, clear S1 and S2, no murmurs, rubs, or gallops Respiratory: clear to auscultation bilaterally, no increased work of breathing Abdomen: soft, some tenderness in middle lower quadrant, nondistended, no masses or organomegaly. Bowel sounds present Extremities: no edema or cyanosis. Warm, well perfused. 2+ radial and PT pulses bilaterally Skin: warm and dry, no rashes noted Neuro: alert and oriented, no focal deficits GU: not done  Assessment & Plan:    Pelvic pain Patient reports pelvic pain ever since Hysteroscopy with dilation curettage in February.  Op note reviewed and notes that patient tolerated procedure well, no uterine rupture mention.  Deferred pelvic exam due to pain today.  Advised that patient use ibuprofen as needed and have quick follow-up with GYN as they performed surgery.  Have placed referral to gynecology but patient stated that she will call them as well.  UA and urine culture obtained today to rule out urine infection etiology.  Strict return precautions given.  Advised to go to the emergency department if increased pain over the weekend or change in pain.  Follow-up in 1 month after seeing gynecologist.  Advised to see gynecologist  within the week.   Return in about 1 month (around 11/04/2018).   Caroline More, DO, PGY-3

## 2018-10-06 ENCOUNTER — Emergency Department (HOSPITAL_COMMUNITY)
Admission: EM | Admit: 2018-10-06 | Discharge: 2018-10-07 | Disposition: A | Discharge status: Home / Self Care | Payer: PPO | Attending: Emergency Medicine | Admitting: Emergency Medicine

## 2018-10-06 ENCOUNTER — Other Ambulatory Visit: Payer: Self-pay

## 2018-10-06 ENCOUNTER — Encounter (HOSPITAL_COMMUNITY): Payer: Self-pay | Admitting: *Deleted

## 2018-10-06 DIAGNOSIS — N200 Calculus of kidney: Secondary | ICD-10-CM | POA: Diagnosis not present

## 2018-10-06 DIAGNOSIS — R102 Pelvic and perineal pain: Secondary | ICD-10-CM

## 2018-10-06 DIAGNOSIS — M25521 Pain in right elbow: Secondary | ICD-10-CM | POA: Insufficient documentation

## 2018-10-06 DIAGNOSIS — Z7982 Long term (current) use of aspirin: Secondary | ICD-10-CM | POA: Insufficient documentation

## 2018-10-06 DIAGNOSIS — I10 Essential (primary) hypertension: Secondary | ICD-10-CM | POA: Diagnosis not present

## 2018-10-06 DIAGNOSIS — Z87891 Personal history of nicotine dependence: Secondary | ICD-10-CM | POA: Diagnosis not present

## 2018-10-06 DIAGNOSIS — M25522 Pain in left elbow: Secondary | ICD-10-CM | POA: Diagnosis not present

## 2018-10-06 DIAGNOSIS — Z79899 Other long term (current) drug therapy: Secondary | ICD-10-CM | POA: Diagnosis not present

## 2018-10-06 DIAGNOSIS — Z91040 Latex allergy status: Secondary | ICD-10-CM | POA: Diagnosis not present

## 2018-10-06 DIAGNOSIS — D259 Leiomyoma of uterus, unspecified: Secondary | ICD-10-CM | POA: Diagnosis not present

## 2018-10-06 LAB — URINALYSIS, ROUTINE W REFLEX MICROSCOPIC
Bilirubin Urine: NEGATIVE
Glucose, UA: NEGATIVE mg/dL
Hgb urine dipstick: NEGATIVE
Ketones, ur: NEGATIVE mg/dL
Leukocytes,Ua: NEGATIVE
Nitrite: NEGATIVE
Protein, ur: NEGATIVE mg/dL
Specific Gravity, Urine: 1.014 (ref 1.005–1.030)
pH: 5 (ref 5.0–8.0)

## 2018-10-06 LAB — COMPREHENSIVE METABOLIC PANEL
ALT: 15 U/L (ref 0–44)
AST: 19 U/L (ref 15–41)
Albumin: 3.5 g/dL (ref 3.5–5.0)
Alkaline Phosphatase: 97 U/L (ref 38–126)
Anion gap: 9 (ref 5–15)
BUN: 15 mg/dL (ref 8–23)
CO2: 23 mmol/L (ref 22–32)
Calcium: 8.9 mg/dL (ref 8.9–10.3)
Chloride: 108 mmol/L (ref 98–111)
Creatinine, Ser: 1.39 mg/dL — ABNORMAL HIGH (ref 0.44–1.00)
GFR calc Af Amer: 45 mL/min — ABNORMAL LOW (ref >60)
GFR calc non Af Amer: 39 mL/min — ABNORMAL LOW (ref >60)
Glucose, Bld: 99 mg/dL (ref 70–99)
Potassium: 3.8 mmol/L (ref 3.5–5.1)
Sodium: 140 mmol/L (ref 135–145)
Total Bilirubin: 0.6 mg/dL (ref 0.3–1.2)
Total Protein: 6.8 g/dL (ref 6.5–8.1)

## 2018-10-06 LAB — CBC
HCT: 37 % (ref 36.0–46.0)
Hemoglobin: 11.5 g/dL — ABNORMAL LOW (ref 12.0–15.0)
MCH: 31.2 pg (ref 26.0–34.0)
MCHC: 31.1 g/dL (ref 30.0–36.0)
MCV: 100.3 fL — ABNORMAL HIGH (ref 80.0–100.0)
Platelets: 262 10*3/uL (ref 150–400)
RBC: 3.69 MIL/uL — ABNORMAL LOW (ref 3.87–5.11)
RDW: 13.9 % (ref 11.5–15.5)
WBC: 10.6 10*3/uL — ABNORMAL HIGH (ref 4.0–10.5)
nRBC: 0 % (ref 0.0–0.2)

## 2018-10-06 LAB — LIPASE, BLOOD: Lipase: 29 U/L (ref 11–51)

## 2018-10-06 LAB — URINE CULTURE

## 2018-10-06 MED ORDER — SODIUM CHLORIDE 0.9% FLUSH
3.0000 mL | Freq: Once | INTRAVENOUS | Status: AC
  Administered 2018-10-07: 3 mL via INTRAVENOUS

## 2018-10-06 NOTE — ED Triage Notes (Signed)
Pt reports several days of generalized abd pain with diarrhea. Also has bilateral elbow pain.

## 2018-10-07 ENCOUNTER — Emergency Department (HOSPITAL_COMMUNITY): Payer: PPO

## 2018-10-07 ENCOUNTER — Telehealth: Payer: Self-pay

## 2018-10-07 ENCOUNTER — Encounter (HOSPITAL_COMMUNITY): Payer: Self-pay | Admitting: Emergency Medicine

## 2018-10-07 ENCOUNTER — Telehealth: Payer: Self-pay | Admitting: Obstetrics and Gynecology

## 2018-10-07 ENCOUNTER — Telehealth: Payer: Self-pay | Admitting: *Deleted

## 2018-10-07 DIAGNOSIS — R102 Pelvic and perineal pain: Secondary | ICD-10-CM

## 2018-10-07 DIAGNOSIS — D259 Leiomyoma of uterus, unspecified: Secondary | ICD-10-CM | POA: Diagnosis not present

## 2018-10-07 DIAGNOSIS — N200 Calculus of kidney: Secondary | ICD-10-CM | POA: Diagnosis not present

## 2018-10-07 HISTORY — DX: Pelvic and perineal pain: R10.2

## 2018-10-07 MED ORDER — ALUM & MAG HYDROXIDE-SIMETH 200-200-20 MG/5ML PO SUSP
30.0000 mL | Freq: Once | ORAL | Status: AC
  Administered 2018-10-07: 30 mL via ORAL
  Filled 2018-10-07: qty 30

## 2018-10-07 MED ORDER — LIDOCAINE VISCOUS HCL 2 % MT SOLN
15.0000 mL | Freq: Once | OROMUCOSAL | Status: AC
  Administered 2018-10-07: 15 mL via ORAL
  Filled 2018-10-07: qty 15

## 2018-10-07 MED ORDER — LIDOCAINE 5 % EX PTCH
2.0000 | MEDICATED_PATCH | CUTANEOUS | Status: DC
  Administered 2018-10-07: 2 via TRANSDERMAL
  Filled 2018-10-07: qty 2

## 2018-10-07 MED ORDER — LIDOCAINE 5 % EX PTCH: 1.0000 | MEDICATED_PATCH | CUTANEOUS | 0 refills | Status: DC

## 2018-10-07 MED ORDER — KETOROLAC TROMETHAMINE 15 MG/ML IJ SOLN
15.0000 mg | Freq: Once | INTRAMUSCULAR | Status: AC
  Administered 2018-10-07: 15 mg via INTRAVENOUS
  Filled 2018-10-07: qty 1

## 2018-10-07 NOTE — Telephone Encounter (Signed)
LVM for patient to call office for results of test.  .[name], [place]

## 2018-10-07 NOTE — Telephone Encounter (Signed)
Patient called complaining of abdominal pain that has returned.  She states that she had a procedure in February to diagnose and fix this issue and her pain is now back.    Reviewed with Dr. Elly Modena regarding need for follow up.   Patient's procedure was to rule out cancer due to an abnormal ultrasound.  This was ruled out.  Patient will need to follow up with her PCP regarding pain and/or any other work up needed.   Left message with patient regarding recommendation from Dr. Elly Modena.

## 2018-10-07 NOTE — Telephone Encounter (Signed)
Pt called back.  Do not see message from MD.  Will forward back to CMA. Christen Bame, CMA

## 2018-10-07 NOTE — Telephone Encounter (Signed)
Pt called to office earlier today, did not leave message.  Attempt to return call, LM on VM to call if needed.

## 2018-10-07 NOTE — ED Provider Notes (Signed)
[facility] EMERGENCY DEPARTMENT Provider Note   CSN: [id number] Arrival date & time: 10/06/18  2209     History   Chief Complaint Chief Complaint  Patient presents with   Abdominal Pain   Joint Pain    HPI [name] is a 68 y.o. female.     The history is provided by the patient.  Abdominal Pain Pain location:  Suprapubic Pain quality: cramping   Pain radiates to:  Does not radiate Pain severity:  Moderate Onset quality:  Gradual Timing:  Intermittent Progression:  Worsening Chronicity:  Recurrent Context: not alcohol use   Relieved by:  Nothing Worsened by:  Nothing Ineffective treatments:  None tried Associated symptoms: no anorexia, no belching, no chest pain, no cough, no dysuria, no fatigue, no fever and no shortness of breath   Also has B elbow pain.  Has had pelvic pain since havinf DC in February.  No f/c/r.  No n/v/d.    Past Medical History:  Diagnosis Date   Abnormal EKG 2012   hospitalized for T wave inversion in lateral leads with MSK chest pains, normal ECHO, negative trops, no cardiology consult. recent EKG 7/15 stable T wave inversions.    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/30/2015   HLD (hyperlipidemia)    Aortic atherosclerosis ([facility])    Right-sided low back pain without sciatica 07/08/2015   Scotoma of blind spot area in visual field 06/14/2015   Bilateral leg pain 06/14/2015   Seasonal allergies 03/09/2015   Hypopigmentation 03/09/2015   Osteoarthritis of left knee 07/08/2014   DJD  (degenerative joint disease), lumbar 07/08/2014   Osteoarthritis of right knee 07/08/2014   Healthcare maintenance 06/11/2014   Allergic rhinoconjunctivitis of both eyes 06/11/2014   Nearsightedness 04/27/2014   Abnormal EKG    Hypertension 12/11/2013    Past Surgical History:  Procedure Laterality Date   ABDOMINAL SURGERY  2010   for bowel blockage   COLON SURGERY  2010   bowel blockage   COLONOSCOPY  09/2014   HYSTEROSCOPY W/D&C N/A 05/08/2018   Procedure: DILATATION AND CURETTAGE /HYSTEROSCOPY;  Surgeon: Mora Bellman, MD;  Location: [facility];  Service: Gynecology;  Laterality: N/A;   KNEE ARTHROSCOPY Left 09/25/2013   Procedure: LEFT KNEE ARTHROSCOPY WITH PARTIAL MEDIAL AND LATERAL  MENISCECTOMY/DEBRIDEMENT/MICRO FRACTURE MEDIAL FEMORAL CONDYLE, REMOVAL OF OSTEOCONDRAL FRAGMENT;  Surgeon: Johnn Hai, MD;  Location: [facility];  Service: Orthopedics;  Laterality: Left;   KNEE SURGERY Right 1999   TEE WITHOUT CARDIOVERSION N/A 10/04/2015   Procedure: TRANSESOPHAGEAL ECHOCARDIOGRAM (TEE);  Surgeon: Sueanne Margarita, MD;  Location: [facility];  Service: Cardiovascular;  Laterality: N/A;   TUBAL LIGATION  1978     OB History   No obstetric history on file.      Home Medications    Prior to Admission medications   Medication Sig Start Date End Date Taking? Authorizing Provider  aspirin EC 81 MG tablet Take 1 tablet (81 mg total) daily by mouth. 01/31/17   Domenica Fail  Shanon Brow, PA-C  atorvastatin (LIPITOR) 40 MG tablet TAKE 1 TABLET(40 MG) BY MOUTH DAILY AT 6 PM Patient taking differently: Take 40 mg by mouth daily at 6 PM.  11/09/17   Caroline More, DO  carvedilol (COREG) 12.5 MG tablet TAKE 1 TABLET BY MOUTH TWICE DAILY WITH A MEAL 05/27/18   Caroline More, DO  hydrochlorothiazide (HYDRODIURIL) 25 MG tablet TAKE 1 TABLET BY MOUTH DAILY 08/20/18   Caroline More, DO  ibuprofen (ADVIL) 600 MG tablet Take 1 tablet (600 mg total) by mouth every 6 (six)  hours as needed. 08/27/18   Caroline More, DO  ibuprofen (ADVIL) 600 MG tablet Take 1 tablet (600 mg total) by mouth every 8 (eight) hours as needed. 10/04/18   Tammi Klippel, Sherin, DO  lidocaine (LIDODERM) 5 % Place 1 patch onto the skin daily. Remove & Discard patch within 12 hours or as directed by MD 10/07/18   Randal Buba, Adelia Baptista, MD  losartan (COZAAR) 25 MG tablet Take 1 tablet (25 mg total) by mouth at bedtime. 08/28/18   Caroline More, DO  Multiple Vitamin (MULITIVITAMIN WITH MINERALS) TABS Take 1 tablet by mouth daily.    [provider]  oxyCODONE-acetaminophen (PERCOCET/ROXICET) 5-325 MG tablet Take 1 tablet by mouth every 6 (six) hours as needed. 05/08/18   Constant, Peggy, MD  potassium chloride SA (K-DUR,KLOR-CON) 20 MEQ tablet TAKE 1 TABLET(20 MEQ) BY MOUTH DAILY Patient taking differently: Take 20 mEq by mouth daily.  10/16/17   Caroline More, DO    Family History Family History  Problem Relation Age of Onset   Hypertension Mother    Heart disease Mother        has a pacemaker    Alzheimer's disease Mother    Hyperlipidemia Mother    Osteoporosis Mother    Heart failure Father    Hypertension Father    Alzheimer's disease Father    Hypertension Sister    Hypertension Sister    Gout Sister    Alcohol abuse Son    Pancreatitis Son    Cancer Neg Hx    Colon cancer Neg Hx    Esophageal cancer Neg Hx    Stomach cancer Neg Hx    Rectal cancer Neg Hx     Social History Social History   Tobacco Use   Smoking status: Former Smoker    Quit date: 07/29/2010    Years since quitting: 8.1   Smokeless tobacco: Never Used  Substance Use Topics   Alcohol use: Never    Alcohol/week: 0.0 standard drinks    Frequency: Never   Drug use: Never     Allergies   Shrimp [shellfish allergy], Latex, and Penicillins   Review of Systems Review of Systems  Constitutional: Negative for fatigue and fever.  Respiratory: Negative for cough and shortness of  breath.   Cardiovascular: Negative for chest pain and leg swelling.  Gastrointestinal: Positive for abdominal pain. Negative for anorexia.  Genitourinary: Negative for dysuria.  All other systems reviewed and are negative.    Physical Exam Updated Vital Signs BP (!) 154/100    Pulse 74    Temp 98.6 F (37 C) (Oral)    Resp 16    SpO2 100%   Physical Exam Vitals signs and nursing note reviewed.  Constitutional:      General: She is not in acute distress.    Appearance: She is obese.  HENT:     Head: Normocephalic and atraumatic.     Nose: Nose normal.  Eyes:  Conjunctiva/sclera: Conjunctivae normal.     Pupils: Pupils are equal, round, and reactive to light.  Neck:     Musculoskeletal: Normal range of motion and neck supple.  Cardiovascular:     Rate and Rhythm: Normal rate and regular rhythm.     Pulses: Normal pulses.     Heart sounds: Normal heart sounds.  Pulmonary:     Effort: Pulmonary effort is normal.     Breath sounds: Normal breath sounds.  Abdominal:     General: Abdomen is flat. Bowel sounds are normal.     Tenderness: There is no abdominal tenderness. There is no guarding.  Musculoskeletal: Normal range of motion.        General: No tenderness.     Right elbow: Normal.    Left elbow: Normal.     Right wrist: Normal.     Left wrist: Normal.     Right hand: Normal. She exhibits normal capillary refill. Normal sensation noted. Normal strength noted.     Left hand: Normal. She exhibits normal capillary refill. Normal sensation noted. Normal strength noted.  Skin:    General: Skin is warm.  Neurological:     Mental Status: She is alert.      ED Treatments / Results  Labs (all labs ordered are listed, but only abnormal results are displayed) Labs Reviewed  COMPREHENSIVE METABOLIC PANEL - Abnormal; Notable for the following components:      Result Value   Creatinine, Ser 1.39 (*)    GFR calc non Af Amer 39 (*)    GFR calc Af Amer 45 (*)    All  other components within normal limits  CBC - Abnormal; Notable for the following components:   WBC 10.6 (*)    RBC 3.69 (*)    Hemoglobin 11.5 (*)    MCV 100.3 (*)    All other components within normal limits  LIPASE, BLOOD  URINALYSIS, ROUTINE W REFLEX MICROSCOPIC    EKG None  Radiology Ct Renal Stone Study  Result Date: 10/07/2018 CLINICAL DATA:  68 y/o  F; bilateral flank pain. EXAM: CT ABDOMEN AND PELVIS WITHOUT CONTRAST TECHNIQUE: Multidetector CT imaging of the abdomen and pelvis was performed following the standard protocol without IV contrast. COMPARISON:  08/17/2018 CT abdomen and pelvis. FINDINGS: Lower chest: No acute abnormality. Hepatobiliary: No focal liver abnormality is seen. No gallstones, gallbladder wall thickening, or biliary dilatation. Pancreas: Unremarkable. No pancreatic ductal dilatation or surrounding inflammatory changes. Spleen: Normal in size without focal abnormality. Adrenals/Urinary Tract: Adrenal glands are unremarkable. Punctate right kidney stone. Otherwise kidneys are normal, without additional renal calculi, focal lesion, or hydronephrosis. Bladder is unremarkable. Stomach/Bowel: Stomach is within normal limits. Appendix appears normal. No evidence of bowel wall thickening, distention, or inflammatory changes. Vascular/Lymphatic: Aortic atherosclerosis. No enlarged abdominal or pelvic lymph nodes. Reproductive: Multiple stable calcified uterine fibroids. No adnexal mass. Other: Small fat containing periumbilical hernia. Musculoskeletal: No fracture is seen. Lumbar spine dextrocurvature and advanced multilevel discogenic degenerative changes. IMPRESSION: 1. No acute process identified. 2. Stable punctate right kidney stone. 3. Stable calcified uterine fibroids. 4. Aortic atherosclerosis. 5. Stable small fat containing periumbilical hernia. Electronically Signed   By: Kristine Garbe M.D.   On: 10/07/2018 03:37    Procedures Procedures (including  critical care time)  Medications Ordered in ED Medications  lidocaine (LIDODERM) 5 % 2 patch (2 patches Transdermal Patch Applied 10/07/18 0314)  sodium chloride flush (NS) 0.9 % injection 3 mL (3 mLs Intravenous Given 10/07/18 0313)  ketorolac (TORADOL) 15 MG/ML injection 15 mg (15 mg Intravenous Given 10/07/18 0314)  alum & mag hydroxide-simeth (MAALOX/MYLANTA) 200-200-20 MG/5ML suspension 30 mL (30 mLs Oral Given 10/07/18 0313)    And  lidocaine (XYLOCAINE) 2 % viscous mouth solution 15 mL (15 mLs Oral Given 10/07/18 0313)      Follow up with your GYN for ongoing care.   Final Clinical Impressions(s) / ED Diagnoses   Final diagnoses:  Pelvic pain  Pain of both elbows   Return for intractable cough, coughing up blood,fevers >100.4 unrelieved by medication, shortness of breath, intractable vomiting, chest pain, shortness of breath, weakness,numbness, changes in speech, facial asymmetry,abdominal pain, passing out,Inability to tolerate liquids or food, cough, altered mental status or any concerns. No signs of systemic illness or infection. The patient is nontoxic-appearing on exam and vital signs are within normal limits.   I have reviewed the triage vital signs and the nursing notes. Pertinent labs &imaging results that were available during my care of the patient were reviewed by me and considered in my medical decision making (see chart for details).  After history, exam, and medical workup I feel the patient has been appropriately medically screened and is safe for discharge home. Pertinent diagnoses were discussed with the patient. Patient was given return precautions  ED Discharge Orders         Ordered    lidocaine (LIDODERM) 5 %  Every 24 hours     10/07/18 0355           Sophiarose Eades, MD 10/07/18 0505

## 2018-10-07 NOTE — Assessment & Plan Note (Addendum)
Patient reports pelvic pain ever since Hysteroscopy with dilation curettage in February.  Op note reviewed and notes that patient tolerated procedure well, no uterine rupture mention.  Deferred pelvic exam due to pain today.  Advised that patient use ibuprofen as needed and have quick follow-up with GYN as they performed surgery.  Have placed referral to gynecology but patient stated that she will call them as well.  UA and urine culture obtained today to rule out urine infection etiology.  Strict return precautions given.  Advised to go to the emergency department if increased pain over the weekend or change in pain.  Follow-up in 1 month after seeing gynecologist.  Advised to see gynecologist within the week.

## 2018-10-07 NOTE — Telephone Encounter (Signed)
Called and informed patient of negative test results per Dr. Tammi Klippel.  .[name], [place]

## 2018-10-10 ENCOUNTER — Telehealth: Payer: Self-pay | Admitting: *Deleted

## 2018-10-10 NOTE — Telephone Encounter (Signed)
Pt lm on nurse line that states to call her about her pain. .  Attempted to call back.  No answer and no machine. Christen Bame, CMA

## 2018-10-11 ENCOUNTER — Other Ambulatory Visit: Payer: Self-pay

## 2018-10-11 ENCOUNTER — Telehealth: Payer: Self-pay | Admitting: Family Medicine

## 2018-10-11 DIAGNOSIS — R102 Pelvic and perineal pain: Secondary | ICD-10-CM

## 2018-10-11 NOTE — Telephone Encounter (Signed)
Was able to call [facility] (where new referral was placed). They stated they will call patient and inform her of appointment time.   Please call patient and inform her she should be getting a call with appointment time. Phone number for [facility] is [place], [place], PGY-3 [facility] 10/11/2018 11:40 AM

## 2018-10-11 NOTE — Telephone Encounter (Signed)
Called patient to see if she wanted to make an appointment.  Patient was very annoyed and frustrated that nothing has been done about her constant pain.  Patient states that the pain is worse in the morning and it is hard for her to walk when she wakes up.  She has to walk "stooped over".  Patient says that her elbow also gives her a problem from time to time.  Patient states that the patch for pain is not covered by her insurance and that she needs something else that will be covered.  Patient also states that "Tramadol via injection is the only thing that helps. Orally it does not help."  Patent wants to talk to Dr. Tammi Klippel because she does not understand her pain and that the CT tells her nothing. Patient would like to know if an MRI may be the next step because she knows that something is not right."  .[name], CMA

## 2018-10-11 NOTE — Telephone Encounter (Signed)
Attempted to call patient. No answer. VM left to call clinic back.   If patient calls back please schedule her for virtual visit THAT SAME DAY (could be scheduled in ATC or another provider if I do not have appointment slots that day). If her pain is worsened/severe she should go to ED.   Dalphine Handing, PGY-3 [facility] 10/11/2018 8:41 AM

## 2018-10-11 NOTE — Telephone Encounter (Addendum)
Attempted to call patient back again (four attempts) with no answer. VM left to please call clinic back.   If patient has worsening pain she needs to be seen. Can consider PM appointment today (in ATC or with another provider). At last appointment I recommended that she sees Gynecology for follow up. It appears she called and they informed her she first needs more work up by PCP. On referral it looks like she was referred to [facility]. I called to schedule an appointment for patient but they stated they do not take Medicare. Have sent message to Leory Plowman to re-send referral and will place new referral.   Patient should be seen today if she is having severe pain. Can be seen in ATC or by another provider.   Please call patient to inform her she should be seen and schedule an appointment.   Addendum: My fourth attempted calling patient was after CMA Leonia Corona had called patient and spoke to her about her frustration.  Leonia Corona was able to speak to me in person before writing her note and I attempted to call patient back.  Patient will need to be called back to be given the above information and schedule an appointment to be seen today.  Dalphine Handing, PGY-3 [facility] 10/11/2018 11:08 AM

## 2018-10-14 ENCOUNTER — Other Ambulatory Visit: Payer: Self-pay

## 2018-10-14 ENCOUNTER — Ambulatory Visit (INDEPENDENT_AMBULATORY_CARE_PROVIDER_SITE_OTHER): Payer: PPO | Admitting: Gynecology

## 2018-10-14 ENCOUNTER — Encounter: Payer: Self-pay | Admitting: Gynecology

## 2018-10-14 VITALS — BP 140/82 | Ht 61.5 in | Wt 182.0 lb

## 2018-10-14 DIAGNOSIS — R109 Unspecified abdominal pain: Secondary | ICD-10-CM

## 2018-10-14 NOTE — Patient Instructions (Signed)
Follow-up with your primary physician for further evaluation of your left-sided pain.  I do not feel that it is from a gynecologic etiology.

## 2018-10-14 NOTE — Progress Notes (Signed)
    [name] Dec 06, 1950 [id number]        68 y.o.  G2P2 new patient presents in referral for left flank pain.  Patient notes the onset of left flank pain over the last several months.  Had been evaluated for renal lithiasis with negative CT scans.  Her most recent CT scan a week ago showed stable uterine fibroid calcifications with no adnexal masses or pelvic adenopathy.  She did have a hysteroscopy D&C 04/2018 by Dr. Elly Modena due to thicker endometrial echo on studies done for other reasons.  She has had no bleeding or other gynecologic symptoms.  The hysteroscopy was negative and the curettage showed atrophic endometrium.  Pap smear/HPV negative 2017.  No constipation or diarrhea.  No urinary symptoms such as frequency dysuria urgency.  Recent urine analysis was normal.  Past medical history,surgical history, problem list, medications, allergies, family history and social history were all reviewed and documented in the EPIC chart.  Directed ROS with pertinent positives and negatives documented in the history of present illness/assessment and plan.  Exam: Caryn Bee assistant Vitals:   10/14/18 1010  BP: 140/82  Weight: 182 lb (82.6 kg)  Height: 5' 1.5" (1.562 m)   General appearance:  Normal Spine straight without CVA tenderness.  The area patient's pointing to is above the left iliac crest in the flank region as to where her pain is. Abdomen soft nontender without masses guarding rebound Pelvic external BUS vagina with atrophic changes.  Cervix with atrophic changes.  Uterus grossly normal midline mobile nontender.  Adnexa without masses or tenderness.  Rectal exam is normal.  Assessment/Plan:  68 y.o. G2P2 with left flank pain.  No gynecologic symptoms.  CT scan showed some calcifications within the uterus thought secondary to her history of leiomyoma but otherwise normal with no adnexal pathology or adenopathy.  It did show degenerative lower back changes.  No evidence of renal  lithiasis.  Discussed with patient I do not think her pain is from a gynecologic source.  Question whether she is having referred from her spine/hip causing her discomfort.  She does note some issues with ambulation when she first gets up in the morning.  She is going to follow-up with her primary provider for further evaluation and possible referral to orthopedics.    Anastasio Auerbach MD, 10:54 AM 10/14/2018

## 2018-10-18 ENCOUNTER — Telehealth: Payer: Self-pay | Admitting: *Deleted

## 2018-10-18 NOTE — Telephone Encounter (Signed)
Attempted to call pt no answer. [name] Kennon Holter, CMA

## 2018-11-25 ENCOUNTER — Encounter: Payer: Self-pay | Admitting: Gastroenterology

## 2018-11-27 ENCOUNTER — Other Ambulatory Visit: Payer: Self-pay | Admitting: Family Medicine

## 2018-11-27 DIAGNOSIS — Z1231 Encounter for screening mammogram for malignant neoplasm of breast: Secondary | ICD-10-CM

## 2018-12-04 ENCOUNTER — Ambulatory Visit (INDEPENDENT_AMBULATORY_CARE_PROVIDER_SITE_OTHER): Payer: PPO | Admitting: Family Medicine

## 2018-12-04 ENCOUNTER — Encounter: Payer: Self-pay | Admitting: Family Medicine

## 2018-12-04 ENCOUNTER — Other Ambulatory Visit: Payer: Self-pay

## 2018-12-04 VITALS — BP 154/86 | HR 63

## 2018-12-04 DIAGNOSIS — M25552 Pain in left hip: Secondary | ICD-10-CM | POA: Diagnosis not present

## 2018-12-04 DIAGNOSIS — M545 Low back pain: Secondary | ICD-10-CM | POA: Diagnosis not present

## 2018-12-04 DIAGNOSIS — N898 Other specified noninflammatory disorders of vagina: Secondary | ICD-10-CM

## 2018-12-04 DIAGNOSIS — R350 Frequency of micturition: Secondary | ICD-10-CM | POA: Diagnosis not present

## 2018-12-04 DIAGNOSIS — G8929 Other chronic pain: Secondary | ICD-10-CM

## 2018-12-04 LAB — POCT WET PREP (WET MOUNT)
Clue Cells Wet Prep Whiff POC: NEGATIVE
Trichomonas Wet Prep HPF POC: ABSENT

## 2018-12-04 LAB — POCT URINALYSIS DIP (MANUAL ENTRY)
Bilirubin, UA: NEGATIVE
Blood, UA: NEGATIVE
Glucose, UA: NEGATIVE mg/dL
Ketones, POC UA: NEGATIVE mg/dL
Leukocytes, UA: NEGATIVE
Nitrite, UA: NEGATIVE
Protein Ur, POC: NEGATIVE mg/dL
Spec Grav, UA: 1.015 (ref 1.010–1.025)
Urobilinogen, UA: 0.2 E.U./dL
pH, UA: 5.5 (ref 5.0–8.0)

## 2018-12-04 MED ORDER — FLUCONAZOLE 150 MG PO TABS: ORAL_TABLET | ORAL | 0 refills | Status: DC

## 2018-12-04 MED ORDER — DICLOFENAC SODIUM 1 % TD GEL: 2.0000 g | Freq: Four times a day (QID) | TRANSDERMAL | 0 refills | Status: DC

## 2018-12-04 NOTE — Patient Instructions (Signed)
It was a pleasure seeing you today.   Today we discussed your back pain  For your pain: you can continue ibuprofen. You can use voltaren gel for pain relief. If worsening come back in. If you have any incontinence or fever go to the emergency room.   Please follow up in 1 month or sooner if symptoms persist or worsen. Please call the clinic immediately if you have any concerns.   Our clinic's number is [phone]. Please call with questions or concerns.   Please go to the emergency room if you have incontinence or fever or weakness  Thank you,  Caroline More, DO

## 2018-12-04 NOTE — Progress Notes (Signed)
   Subjective:    Patient ID: [name], female    DOB: [birth year], 68 y.o.   MRN: [id number]   CC:  HPI: Back pain Recently seen by me on 09/2018.  At that time pain was thought to be secondary to gynecological etiology from her hysteroscopy and dilation and curettage in February.  Was referred to gynecology.  Had seen gynecology on 10/14/2018.  At that time she reported she had no gynecological symptoms.  CT scan showed calcifications within the uterus secondary to leiomyoma but otherwise normal.  Patient did have degenerative low back changes in her CT finding.  Patient reports she continues to have low back pain and left sided pain. States that she also has pain in her elbows with tingling in her fingers occasionally. Denies weakness. States that she has to walk bent over but does not have improvement in pain with bending forward compared to standing straight. Nothing helps the pain but pain is worse with walking. Heating pads have not helped. Is using ibuprofen PRN for pain. No incontinence or fever.   Vaginal itching  Patient reports she has been having some "feminine itch". Denies vaginal discharge. Denies odor. Is not sexually active. Has tried OTC monostat without relief.   Urinary frequency Patient reports urinary frequency "when taking water pills". Reports some dysuria but denies with every urination.   Objective:  BP (!) 154/86   Pulse 63   SpO2 96%  Vitals and nursing note reviewed  General: well nourished, in no acute distress HEENT: normocephalic, no scleral icterus or conjunctival pallor Neck: supple  Cardiac: Regular rate Respiratory: speaking full sentences, no increased work of breathing Extremities: no edema or cyanosis. Warm, well perfused. 2+ radial and PT pulses bilaterally Back: no spinal tenderness. Tenderness to paraspinal musculature. Tenderness in left hip. No CVA tenderness. Full flexion but with pain. Bent forward during gait.  Skin: warm and dry, no  rashes noted Neuro: alert and oriented, no focal deficits Female genitalia: normal external genitalia, vulva, vagina, cervix, uterus and adnexa  Assessment & Plan:    Back pain Patient with lumbar back pain and left hip pain. Can consider osteoarthritis given previous degenerative changes seen on CT imaging. No weakness. No incontinence. No fever. Unlikely renal stone given no CVA tenderness of hematuria. Will plan to obtain xray imaging of both lumbar spine and left hip. Can consider spinal stenosis given that patient notes walking with forward bending. Patient reported she had benefit with Toradol injection, however, no one available to drive patient home today so will not administer in office. Will give RX for voltaren gel to be used PRN for pain. Follow up in 1 month, strict return precautions given.   Vagina itching Wet prep negative at this time. However, given sensitivity and specificity will give RX for diflucan. Can also consider post menopausal atrophy, however skin was wnl externally and no significant vaginal changes on speculum exam. Follow up if no improvement.   Urinary frequency Likely related to diuretic use. Will obtain UA and culture to ensure there is no infectious etiology. Follow up if no improvement.     Return in about 4 weeks (around 01/01/2019).   Caroline More, DO, PGY-3

## 2018-12-05 ENCOUNTER — Encounter: Payer: Self-pay | Admitting: Family Medicine

## 2018-12-05 DIAGNOSIS — N898 Other specified noninflammatory disorders of vagina: Secondary | ICD-10-CM | POA: Insufficient documentation

## 2018-12-05 HISTORY — DX: Other specified noninflammatory disorders of vagina: N89.8

## 2018-12-05 NOTE — Assessment & Plan Note (Signed)
Wet prep negative at this time. However, given sensitivity and specificity will give RX for diflucan. Can also consider post menopausal atrophy, however skin was wnl externally and no significant vaginal changes on speculum exam. Follow up if no improvement.

## 2018-12-05 NOTE — Assessment & Plan Note (Signed)
Likely related to diuretic use. Will obtain UA and culture to ensure there is no infectious etiology. Follow up if no improvement.

## 2018-12-05 NOTE — Assessment & Plan Note (Signed)
Patient with lumbar back pain and left hip pain. Can consider osteoarthritis given previous degenerative changes seen on CT imaging. No weakness. No incontinence. No fever. Unlikely renal stone given no CVA tenderness of hematuria. Will plan to obtain xray imaging of both lumbar spine and left hip. Can consider spinal stenosis given that patient notes walking with forward bending. Patient reported she had benefit with Toradol injection, however, no one available to drive patient home today so will not administer in office. Will give RX for voltaren gel to be used PRN for pain. Follow up in 1 month, strict return precautions given.

## 2018-12-06 LAB — URINE CULTURE

## 2018-12-07 DIAGNOSIS — Z03818 Encounter for observation for suspected exposure to other biological agents ruled out: Secondary | ICD-10-CM | POA: Diagnosis not present

## 2018-12-09 ENCOUNTER — Telehealth: Payer: Self-pay

## 2018-12-09 NOTE — Telephone Encounter (Signed)
Called patient and informed of results.  Patient states that she has taken her first pill of Diclofenac and is awaiting 72 hours to take second dose.  Patient states she is still having issues.  [name], [place]

## 2018-12-11 ENCOUNTER — Encounter: Payer: Self-pay | Admitting: Gastroenterology

## 2018-12-13 ENCOUNTER — Other Ambulatory Visit: Payer: Self-pay | Admitting: Family Medicine

## 2018-12-13 ENCOUNTER — Other Ambulatory Visit: Payer: Self-pay | Admitting: Internal Medicine

## 2018-12-13 DIAGNOSIS — E876 Hypokalemia: Secondary | ICD-10-CM

## 2018-12-15 ENCOUNTER — Other Ambulatory Visit: Payer: Self-pay | Admitting: Family Medicine

## 2018-12-15 DIAGNOSIS — E876 Hypokalemia: Secondary | ICD-10-CM

## 2018-12-16 ENCOUNTER — Ambulatory Visit (AMBULATORY_SURGERY_CENTER): Payer: Self-pay | Admitting: *Deleted

## 2018-12-16 ENCOUNTER — Other Ambulatory Visit: Payer: Self-pay

## 2018-12-16 ENCOUNTER — Other Ambulatory Visit: Payer: Self-pay | Admitting: *Deleted

## 2018-12-16 VITALS — Temp 96.9°F | Ht 65.0 in | Wt 184.8 lb

## 2018-12-16 DIAGNOSIS — E876 Hypokalemia: Secondary | ICD-10-CM

## 2018-12-16 DIAGNOSIS — I10 Essential (primary) hypertension: Secondary | ICD-10-CM

## 2018-12-16 DIAGNOSIS — Z86010 Personal history of colonic polyps: Secondary | ICD-10-CM

## 2018-12-16 MED ORDER — PEG 3350-KCL-NA BICARB-NACL 420 G PO SOLR: 4000.0000 mL | Freq: Once | ORAL | 0 refills | Status: AC

## 2018-12-16 NOTE — Telephone Encounter (Signed)
Pt is requesting to have more than one fill sent to pharmacy.  She ask if Dr. Tammi Klippel is planning on taking her off the medications, if not then she needs refills. Christen Bame, CMA

## 2018-12-16 NOTE — Progress Notes (Addendum)
No egg or soy allergy known to patient  No issues with past sedation with any surgeries  or procedures, no intubation problems  No diet pills per patient No home 02 use per patient  No blood thinners per patient  Pt denies issues with constipation  No A fib or A flutter  EMMI video sent to pt's e mail   Due to the COVID-19 pandemic we are asking patients to follow these guidelines. Please only bring one care partner. Please be aware that your care partner may wait in the car in the parking lot or if they feel like they will be too hot to wait in the car, they may wait in the lobby on the 4th floor. All care partners are required to wear a mask the entire time (we do not have any that we can provide them), they need to practice social distancing, and we will do a Covid check for all patient's and care partners when you arrive. Also we will check their temperature and your temperature. If the care partner waits in their car they need to stay in the parking lot the entire time and we will call them on their cell phone when the patient is ready for discharge so they can bring the car to the front of the building. Also all patient's will need to wear a mask into building.  Patient seemed mildly confused about prep instructions.  Discussed instructions several times in pre-visit today.  Patient verbalized understanding but unsure that she fully understood.  Encouraged patient to call with any questions.

## 2018-12-17 ENCOUNTER — Encounter: Payer: Self-pay | Admitting: Gastroenterology

## 2018-12-17 MED ORDER — HYDROCHLOROTHIAZIDE 25 MG PO TABS: 25.0000 mg | ORAL_TABLET | Freq: Every day | ORAL | 3 refills | Status: DC

## 2018-12-17 MED ORDER — LOSARTAN POTASSIUM 25 MG PO TABS: 25.0000 mg | ORAL_TABLET | Freq: Every day | ORAL | 3 refills | Status: DC

## 2018-12-17 MED ORDER — POTASSIUM CHLORIDE CRYS ER 20 MEQ PO TBCR: 20.0000 meq | EXTENDED_RELEASE_TABLET | Freq: Every day | ORAL | 3 refills | Status: DC

## 2018-12-30 ENCOUNTER — Ambulatory Visit (AMBULATORY_SURGERY_CENTER): Payer: Medicare Other | Admitting: Gastroenterology

## 2018-12-30 ENCOUNTER — Other Ambulatory Visit: Payer: Self-pay

## 2018-12-30 ENCOUNTER — Encounter: Payer: Self-pay | Admitting: Gastroenterology

## 2018-12-30 VITALS — BP 145/73 | HR 52 | Temp 97.7°F | Resp 15 | Ht 65.0 in | Wt 184.8 lb

## 2018-12-30 DIAGNOSIS — D124 Benign neoplasm of descending colon: Secondary | ICD-10-CM

## 2018-12-30 DIAGNOSIS — K635 Polyp of colon: Secondary | ICD-10-CM

## 2018-12-30 DIAGNOSIS — D125 Benign neoplasm of sigmoid colon: Secondary | ICD-10-CM

## 2018-12-30 DIAGNOSIS — Z86010 Personal history of colonic polyps: Secondary | ICD-10-CM | POA: Diagnosis not present

## 2018-12-30 MED ORDER — SODIUM CHLORIDE 0.9 % IV SOLN: 500.0000 mL | Freq: Once | INTRAVENOUS | Status: DC

## 2018-12-30 NOTE — Op Note (Signed)
[facility] Patient Name: [name] Procedure Date: 12/30/2018 10:01 AM MRN: [id number] Endoscopist: Ladene Artist , MD Age: 68 Referring MD:  Date of Birth: [birth year] Gender: Female Account #: [id number] Procedure:                Colonoscopy Indications:              Surveillance: History of adenomatous polyps,                            inadequate prep on last exam (<34yr) Medicines:                Monitored Anesthesia Care Procedure:                Pre-Anesthesia Assessment:                           - Prior to the procedure, a History and Physical                            was performed, and patient medications and                            allergies were reviewed. The patient's tolerance of                            previous anesthesia was also reviewed. The risks                            and benefits of the procedure and the sedation                            options and risks were discussed with the patient.                            All questions were answered, and informed consent                            was obtained. Prior Anticoagulants: The patient has                            taken no previous anticoagulant or antiplatelet                            agents. ASA Grade Assessment: II - A patient with                            mild systemic disease. After reviewing the risks                            and benefits, the patient was deemed in                            satisfactory condition to undergo the procedure.  After obtaining informed consent, the colonoscope                            was passed under direct vision. Throughout the                            procedure, the patient's blood pressure, pulse, and                            oxygen saturations were monitored continuously. The                            Colonoscope was introduced through the anus and                            advanced to the the cecum,  identified by                            appendiceal orifice and ileocecal valve. The                            ileocecal valve, appendiceal orifice, and rectum                            were photographed. The quality of the bowel                            preparation was excellent. The colonoscopy was                            performed without difficulty. The patient tolerated                            the procedure well. Scope In: 10:09:29 AM Scope Out: 10:21:03 AM Scope Withdrawal Time: 0 hours 10 minutes 13 seconds  Total Procedure Duration: 0 hours 11 minutes 34 seconds  Findings:                 The perianal and digital rectal examinations were                            normal.                           Two sessile polyps were found in the sigmoid colon                            and descending colon. The polyps were 5 to 6 mm in                            size. These polyps were removed with a cold snare.                            Resection and retrieval were complete.  Internal hemorrhoids were found during                            retroflexion. The hemorrhoids were small and Grade                            I (internal hemorrhoids that do not prolapse).                           The exam was otherwise without abnormality on                            direct and retroflexion views. Complications:            No immediate complications. Estimated blood loss:                            None. Estimated Blood Loss:     Estimated blood loss: none. Impression:               - Two 5 to 6 mm polyps in the sigmoid colon and in                            the descending colon, removed with a cold snare.                            Resected and retrieved.                           - Internal hemorrhoids.                           - The examination was otherwise normal on direct                            and retroflexion views. Recommendation:           -  Repeat colonoscopy date to be determined after                            pending pathology results are reviewed for                            surveillance with an extended bowel prep.                           - Patient has a contact number available for                            emergencies. The signs and symptoms of potential                            delayed complications were discussed with the                            patient. Return to normal activities tomorrow.  Written discharge instructions were provided to the                            patient.                           - Resume previous diet.                           - Continue present medications.                           - Await pathology results. Ladene Artist, MD 12/30/2018 10:24:50 AM This report has been signed electronically.

## 2018-12-30 NOTE — Progress Notes (Signed)
Called to room to assist during endoscopic procedure.  Patient ID and intended procedure confirmed with present staff. Received instructions for my participation in the procedure from the performing physician.  

## 2018-12-30 NOTE — Progress Notes (Signed)
Report to PACU, RN, vss, BBS= Clear.  

## 2018-12-30 NOTE — Progress Notes (Signed)
Pt's states no medical or surgical changes since previsit or office visit.  Temp taken by KA VS taken by CW

## 2018-12-30 NOTE — Patient Instructions (Signed)
Information on polyps and hemorrhoids given to you today.  Await pathology results.  YOU HAD AN ENDOSCOPIC PROCEDURE TODAY AT THE [facility]:   Refer to the procedure report that was given to you for any specific questions about what was found during the examination.  If the procedure report does not answer your questions, please call your gastroenterologist to clarify.  If you requested that your care partner not be given the details of your procedure findings, then the procedure report has been included in a sealed envelope for you to review at your convenience later.  YOU SHOULD EXPECT: Some feelings of bloating in the abdomen. Passage of more gas than usual.  Walking can help get rid of the air that was put into your GI tract during the procedure and reduce the bloating. If you had a lower endoscopy (such as a colonoscopy or flexible sigmoidoscopy) you may notice spotting of blood in your stool or on the toilet paper. If you underwent a bowel prep for your procedure, you may not have a normal bowel movement for a few days.  Please Note:  You might notice some irritation and congestion in your nose or some drainage.  This is from the oxygen used during your procedure.  There is no need for concern and it should clear up in a day or so.  SYMPTOMS TO REPORT IMMEDIATELY:   Following lower endoscopy (colonoscopy or flexible sigmoidoscopy):  Excessive amounts of blood in the stool  Significant tenderness or worsening of abdominal pains  Swelling of the abdomen that is new, acute  Fever of 100F or higher   For urgent or emergent issues, a gastroenterologist can be reached at any hour by calling [phone].   DIET:  We do recommend a small meal at first, but then you may proceed to your regular diet.  Drink plenty of fluids but you should avoid alcoholic beverages for 24 hours.  ACTIVITY:  You should plan to take it easy for the rest of today and you should NOT DRIVE or use  heavy machinery until tomorrow (because of the sedation medicines used during the test).    FOLLOW UP: Our staff will call the number listed on your records 48-72 hours following your procedure to check on you and address any questions or concerns that you may have regarding the information given to you following your procedure. If we do not reach you, we will leave a message.  We will attempt to reach you two times.  During this call, we will ask if you have developed any symptoms of COVID 19. If you develop any symptoms (ie: fever, flu-like symptoms, shortness of breath, cough etc.) before then, please call [phone].  If you test positive for Covid 19 in the 2 weeks post procedure, please call and report this information to us.    If any biopsies were taken you will be contacted by phone or by letter within the next 1-3 weeks.  Please call us at [phone] if you have not heard about the biopsies in 3 weeks.    SIGNATURES/CONFIDENTIALITY: You and/or your care partner have signed paperwork which will be entered into your electronic medical record.  These signatures attest to the fact that that the information above on your After Visit Summary has been reviewed and is understood.  Full responsibility of the confidentiality of this discharge information lies with you and/or your care-partner. 

## 2019-01-01 ENCOUNTER — Encounter: Payer: Self-pay | Admitting: Gynecology

## 2019-01-01 ENCOUNTER — Telehealth: Payer: Self-pay

## 2019-01-01 NOTE — Telephone Encounter (Signed)
  Follow up Call-  Call back number 12/30/2018 01/01/2018  Post procedure Call Back phone  # [phone]  Permission to leave phone message Yes Yes  Some recent data might be hidden     Patient questions:  Do you have a fever, pain , or abdominal swelling? No. Pain Score  0 *  Have you tolerated food without any problems? Yes.    Have you been able to return to your normal activities? Yes.    Do you have any questions about your discharge instructions: Diet   No. Medications  No. Follow up visit  No.  Do you have questions or concerns about your Care? No.  Actions: * If pain score is 4 or above: 1. No action needed, pain <4.Have you developed a fever since your procedure? no  2.   Have you had an respiratory symptoms (SOB or cough) since your procedure? no  3.   Have you tested positive for COVID 19 since your procedure no  4.   Have you had any family members/close contacts diagnosed with the COVID 19 since your procedure?  no   If yes to any of these questions please route to Joylene John, RN and Alphonsa Gin, Therapist, sports.

## 2019-01-02 ENCOUNTER — Encounter: Payer: Self-pay | Admitting: Gastroenterology

## 2019-01-09 ENCOUNTER — Other Ambulatory Visit: Payer: Self-pay

## 2019-01-09 ENCOUNTER — Ambulatory Visit
Admission: RE | Admit: 2019-01-09 | Discharge: 2019-01-09 | Disposition: A | Discharge status: Home / Self Care | Payer: PPO | Source: Ambulatory Visit | Attending: Family Medicine | Admitting: Family Medicine

## 2019-01-09 ENCOUNTER — Telehealth: Payer: Self-pay | Admitting: Gastroenterology

## 2019-01-09 DIAGNOSIS — Z1231 Encounter for screening mammogram for malignant neoplasm of breast: Secondary | ICD-10-CM | POA: Diagnosis not present

## 2019-01-09 NOTE — Telephone Encounter (Signed)
Pt inquired about results of colon.

## 2019-01-09 NOTE — Telephone Encounter (Signed)
I reviewed pathology results with the patient and all questions answered

## 2019-01-09 NOTE — Telephone Encounter (Signed)
Pt returning call

## 2019-01-09 NOTE — Telephone Encounter (Signed)
Left message for patient to call back  

## 2019-01-13 ENCOUNTER — Other Ambulatory Visit: Payer: Self-pay | Admitting: Family Medicine

## 2019-01-14 ENCOUNTER — Other Ambulatory Visit: Payer: Self-pay

## 2019-01-14 ENCOUNTER — Other Ambulatory Visit: Payer: Self-pay | Admitting: Family Medicine

## 2019-01-14 ENCOUNTER — Telehealth (INDEPENDENT_AMBULATORY_CARE_PROVIDER_SITE_OTHER): Payer: Medicare Other | Admitting: Family Medicine

## 2019-01-14 ENCOUNTER — Ambulatory Visit (HOSPITAL_COMMUNITY)
Admission: RE | Admit: 2019-01-14 | Discharge: 2019-01-14 | Disposition: A | Discharge status: Home / Self Care | Payer: Medicare Other | Source: Ambulatory Visit | Attending: Family Medicine | Admitting: Family Medicine

## 2019-01-14 DIAGNOSIS — L259 Unspecified contact dermatitis, unspecified cause: Secondary | ICD-10-CM | POA: Insufficient documentation

## 2019-01-14 DIAGNOSIS — M545 Low back pain, unspecified: Secondary | ICD-10-CM

## 2019-01-14 DIAGNOSIS — G8929 Other chronic pain: Secondary | ICD-10-CM | POA: Insufficient documentation

## 2019-01-14 DIAGNOSIS — M25552 Pain in left hip: Secondary | ICD-10-CM | POA: Diagnosis not present

## 2019-01-14 DIAGNOSIS — R9389 Abnormal findings on diagnostic imaging of other specified body structures: Secondary | ICD-10-CM

## 2019-01-14 DIAGNOSIS — N898 Other specified noninflammatory disorders of vagina: Secondary | ICD-10-CM | POA: Diagnosis not present

## 2019-01-14 HISTORY — DX: Unspecified contact dermatitis, unspecified cause: L25.9

## 2019-01-14 MED ORDER — TRIAMCINOLONE ACETONIDE 0.1 % EX OINT: 1.0000 "application " | TOPICAL_OINTMENT | Freq: Two times a day (BID) | CUTANEOUS | 1 refills | Status: DC

## 2019-01-14 NOTE — Progress Notes (Signed)
[facility] Tele[facility]icine Visit I connected with  [name] on 01/14/19 by a video enabled telemedicine application and verified that I am speaking with the correct person using two identifiers.   I discussed the limitations of evaluation and management by telemedicine. The patient expressed understanding and agreed to proceed.  Patient consented to have virtual visit. Method of visit: Telephone  Encounter participants: Patient: [name] - located at Home Provider: Caroline More - located at [facility] Others (if applicable): None  Chief Complaint: vaginal itching and rash on arm   HPI: Vaginal itching  Same as when she was seen in September, has not changed. Has not improved at all. Has used RX for diflucan. Is now having burning inside vagina. No discharge, just burning. Patient has tried OTC monistat and diflucan without improvement. Does not use douching. Does not use scented products. Does use olay body wash but she has used that for a long time. Does use a wash cloth to clean her perineum. No fevers . No rashes. No sores. Denies being sexually active.   Rash on arm Patient reports rash on arm.  Has been present for 2 months now.  States it is itchy.  Denies any new cosmetic products.  Denies any other family members with rash.  ROS: per HPI  Pertinent PMHx: thickened endometrium, seasonal allergies   Exam:  Respiratory: speaking full sentences, no increased WOB  Assessment/Plan:  Contact dermatitis Patient with what seems to be contact dermatitis of her arm.  I will prescribe triamcinolone cream.  Follow-up if no improvement.  Vaginal itching Patient with continued vaginal itching.  Patient has tried over-the-counter products as well as Diflucan without any improvement.  Other differentials include contact dermatitis however no new products.  Can consider herpes however no lesions present.  Can consider STD however patient has not been sexually  active for years now.  No new med changes. Other differentials include lichen sclerosus versus neoplasm however patient needs biopsy to confirm the findings.  Given that patient requires further work-up that we do not have the capabilities of in clinic I have referred patient to gynecology.  Discussed this with patient.  Strict return precautions given.  Follow-up with [facility] as needed.   Discussed patient with Dr. Gwendlyn Deutscher   Time spent during visit with patient: 15 minutes

## 2019-01-14 NOTE — Assessment & Plan Note (Signed)
Patient with continued vaginal itching.  Patient has tried over-the-counter products as well as Diflucan without any improvement.  Other differentials include contact dermatitis however no new products.  Can consider herpes however no lesions present.  Can consider STD however patient has not been sexually active for years now.  No new med changes. Other differentials include lichen sclerosus versus neoplasm however patient needs biopsy to confirm the findings.  Given that patient requires further work-up that we do not have the capabilities of in clinic I have referred patient to gynecology.  Discussed this with patient.  Strict return precautions given.  Follow-up with [facility] as needed.

## 2019-01-14 NOTE — Assessment & Plan Note (Deleted)
Patient with continued vaginal itching.  Patient has tried over-the-counter products as well as Diflucan without any improvement.  Other differentials include contact dermatitis however no new products.  Can consider herpes however no lesions present.  Can consider STD however patient has not been sexually active for years now.  Other differentials include lichen sclerosus versus neoplasm however patient needs biopsy to confirm the findings.  Given that patient requires further work-up that we do not have the capabilities of in clinic I have referred patient to gynecology.  Discussed this with patient.  Strict return precautions given.  Follow-up with [facility] as needed.

## 2019-01-14 NOTE — Assessment & Plan Note (Signed)
Patient with what seems to be contact dermatitis of her arm.  I will prescribe triamcinolone cream.  Follow-up if no improvement.

## 2019-01-16 ENCOUNTER — Other Ambulatory Visit: Payer: Self-pay | Admitting: Family Medicine

## 2019-01-16 ENCOUNTER — Telehealth: Payer: Self-pay | Admitting: *Deleted

## 2019-01-16 DIAGNOSIS — M5136 Other intervertebral disc degeneration, lumbar region: Secondary | ICD-10-CM

## 2019-01-16 NOTE — Telephone Encounter (Signed)
Called patient and discussed xray results  Caroline More, DO, PGY-3 [facility] 01/16/2019 8:52 AM

## 2019-01-16 NOTE — Progress Notes (Signed)
Xrays showing degenerative disc disease.  Discussed this with patient.  Patient is willing to try physical therapy.  Ambulatory referral to physical therapy placed.  Advised that if no improvement she should follow-up.  Follow-up sooner if worsening pain.  Dalphine Handing, PGY-3 [facility] 01/16/2019 8:51 AM

## 2019-01-16 NOTE — Telephone Encounter (Signed)
Pt states that she was returning a call to dr. Tammi Klippel from around 8am.  She thinks it may be about her xrays.   To PCP Christen Bame, CMA

## 2019-01-20 ENCOUNTER — Telehealth: Payer: Self-pay | Admitting: Family Medicine

## 2019-01-20 NOTE — Telephone Encounter (Signed)
Discussed with patient over the phone.  Patient is category C breast density.  No mammographic evidence of malignancy.  Recommended for screening mammogram in 1 year.  Explained these results to the patient explained to her what an increased breast density means.  I also explained to her that mammogram may not necessarily be a perfect screening exam but as of yet it shows no mammographic evidence of malignancy.  I advised if she feels any masses or changes in breast that she should come in immediately as we may need further testing such as ultrasound or MRI at that time.  Dalphine Handing, PGY-3 [facility] 01/20/2019 11:17 AM

## 2019-01-20 NOTE — Telephone Encounter (Signed)
Pt is calling and would like to speak with Dr. Tammi Klippel to discuss her mammogram results. They called and informed her that they found "extra tissue" and she would like to know more about what this means.   I offered the pt an appointment with Dr. Tammi Klippel but she said she did not want an appointment.   Please call pt to discuss.

## 2019-01-28 ENCOUNTER — Ambulatory Visit: Payer: Medicare Other | Attending: Family Medicine | Admitting: Physical Therapy

## 2019-01-28 ENCOUNTER — Other Ambulatory Visit: Payer: Self-pay

## 2019-01-28 ENCOUNTER — Encounter: Payer: Self-pay | Admitting: Physical Therapy

## 2019-01-28 DIAGNOSIS — M6281 Muscle weakness (generalized): Secondary | ICD-10-CM | POA: Insufficient documentation

## 2019-01-28 DIAGNOSIS — R293 Abnormal posture: Secondary | ICD-10-CM | POA: Insufficient documentation

## 2019-01-28 DIAGNOSIS — G8929 Other chronic pain: Secondary | ICD-10-CM

## 2019-01-28 DIAGNOSIS — M545 Low back pain: Secondary | ICD-10-CM | POA: Insufficient documentation

## 2019-01-28 NOTE — Patient Instructions (Signed)
Access Code: [email or web address]  URL: [email or web address]  Date: 01/28/2019  Prepared by: Hilda Blades   Exercises Modified Marcello Moores Stretch - 1 minute hold - 2-3x daily Sidelying Thoracic Lumbar Rotation - 10 reps - 2-3x daily Clamshell - 10 reps - 2 sets - 1x daily Supine Bridge - 10 reps - 2 sets - 1x daily Supine March - 10 reps - 2 sets - 1x daily

## 2019-01-28 NOTE — Therapy (Signed)
[place], [place], [address] Phone: [phone]   Fax:  [phone]  Physical Therapy Evaluation  Patient Details  Name: [name] MRN: [id number] Date of Birth: [birth year] Referring Provider (PT): Leeanne Rio, MD   Encounter Date: 01/28/2019  PT End of Session - 01/28/19 1632    Visit Number  1    Number of Visits  8    Date for PT Re-Evaluation  03/25/19    Authorization Type  [employer] Medicare    PT Start Time  1446    PT Stop Time  1530    PT Time Calculation (min)  44 min    Activity Tolerance  Patient tolerated treatment well    Behavior During Therapy  [facility] for tasks assessed/performed       Past Medical History:  Diagnosis Date  . Abnormal EKG 2012   hospitalized for T wave inversion in lateral leads with [facility] chest pains, normal ECHO, negative trops, no cardiology consult. recent EKG 7/15 stable T wave inversions.   . 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 Arthritis 2000  . Arthritis 2011  . Clotting disorder ([place])    blood clot kidney 2017  . Heart murmur   . Hyperlipidemia 2011  . Hypertension 2011  . Meniscus tear 2000   L KNEE  . 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 ABDOMINAL SURGERY  2010   for bowel blockage  . COLON SURGERY  2010   bowel blockage  . COLONOSCOPY  09/2014  . HYSTEROSCOPY W/D&C N/A 05/08/2018   Procedure: DILATATION AND CURETTAGE /HYSTEROSCOPY;  Surgeon: Mora Bellman, MD;  Location: [facility];  Service: Gynecology;  Laterality: N/A;  . KNEE ARTHROSCOPY Left 09/25/2013   Procedure: LEFT KNEE ARTHROSCOPY WITH PARTIAL MEDIAL AND LATERAL  MENISCECTOMY/DEBRIDEMENT/MICRO FRACTURE MEDIAL FEMORAL CONDYLE, REMOVAL OF OSTEOCONDRAL FRAGMENT;  Surgeon: Johnn Hai, MD;  Location[facility];  Service: Orthopedics;  Laterality: Left;  . KNEE SURGERY Right 1999  . POLYPECTOMY    .  TEE WITHOUT CARDIOVERSION N/A 10/04/2015   Procedure: TRANSESOPHAGEAL ECHOCARDIOGRAM (TEE);  Surgeon: Sueanne Margarita, MD;  Location: [facility];  Service: Cardiovascular;  Laterality: N/A;  . TUBAL LIGATION  1978    There were no vitals filed for this visit.   Subjective Assessment - 01/28/19 1450    Subjective  Patient reports left sided back pain that began about 2 months ago. Patient reports when she gets up she can't walk straight, she has to walk bent over. There was no mechanism of injury, gradually worsened.    Limitations  Standing;Walking;House hold activities;Lifting    How long can you sit comfortably?  No problem    How long can you stand comfortably?  10 minutes    How long can you walk comfortably?  10 minutes    Diagnostic tests  X-ray - DDD    Patient Stated Goals  Get rid of pain so she can walk straight    Currently in Pain?  Yes    Pain Score  0-No pain   8/10 at worst   Pain Location  Back    Pain Orientation  Left    Pain Descriptors / Indicators  Aching    Pain Type  Chronic pain    Pain Onset  More than a month ago    Pain Frequency  Intermittent    Aggravating  Factors   When she gets out of bed.    Pain Relieving Factors  Heat, Tylenol or Advil    Effect of Pain on Daily Activities  Patient reports her walking is limited.    Multiple Pain Sites  No         [facility] PT Assessment - 01/28/19 0001      Assessment   Medical Diagnosis  Left sided low back pain    Referring Provider (PT)  Leeanne Rio, MD    Onset Date/Surgical Date  --   Patient reports > 2 months ago   Next MD Visit  Not scheduled    Prior Therapy  Yes for bilat LE      Precautions   Precautions  None      Restrictions   Weight Bearing Restrictions  No      Balance Screen   Has the patient fallen in the past 6 months  No    Has the patient had a decrease in activity level because of a fear of falling?   No    Is the patient reluctant to leave their home because of a fear  of falling?   No      Home Film/video editor residence      Prior Function   Level of Independence  Independent    Vocation  Retired      Associate Professor   Overall Cognitive Status  Within Functional Limits for tasks assessed      Observation/Other Assessments   Observations  Patient exhibits increased lumbar lordosis and hip flexion with standng    Focus on Therapeutic Outcomes (FOTO)   55% limited      Sensation   Light Touch  Appears Intact      Posture/Postural Control   Posture/Postural Control  Postural limitations    Postural Limitations  Rounded Shoulders;Increased lumbar lordosis;Anterior pelvic tilt;Flexed trunk      ROM / Strength   AROM / PROM / Strength  AROM;Strength      AROM   AROM Assessment Site  Lumbar    Lumbar Flexion  Patient able to reach toes, limited lumbar spine remains in lordotic positioning    Lumbar Extension  75%    Lumbar - Right Side Bend  75%    Lumbar - Left Side Bend  75%    Lumbar - Right Rotation  75%    Lumbar - Left Rotation  75%      PROM   PROM Assessment Site  --      Strength   Strength Assessment Site  Hip    Right/Left Hip  Right;Left    Right Hip Flexion  4/5    Right Hip Extension  4-/5    Right Hip ABduction  3/5    Left Hip Flexion  4-/5    Left Hip Extension  3+/5    Left Hip ABduction  3/5      Flexibility   Soft Tissue Assessment /Muscle Length  yes    Hamstrings  WNL    Quadriceps  Limited   hip flexor tightness     Palpation   Spinal mobility  limited mobility of lumbar spine    Palpation comment  Patient reports tenderness of left QL region      Special Tests    Special Tests  Lumbar    Lumbar Tests  Slump Test;Straight Leg Raise;FABER test      FABER test   findings  Negative      Slump test   Findings  Negative      Straight Leg Raise   Findings  Negative      Transfers   Transfers  Independent with all Transfers      Ambulation/Gait   Ambulation/Gait  Yes     Ambulation/Gait Assistance  7: Independent    Gait Comments  Patient ambulates with forward trunk lean, increased lumbar lordosis and anterior pelvic tilt                Objective measurements completed on examination: See above findings.      [facility] Adult PT Treatment/Exercise - 01/28/19 0001      Exercises   Exercises  Lumbar      Lumbar Exercises: Stretches   Other Lumbar Stretch Exercise  Thomas hip flexor stretch on edge of table x1 min each    Other Lumbar Stretch Exercise  Side lying thoracic lumbar open book rotation stretch x10 each      Lumbar Exercises: Supine   Bridge  10 reps   2 sets   Other Supine Lumbar Exercises  Supine marching with core activation x10 alternating      Lumbar Exercises: Sidelying   Clam  10 reps   2 sets            PT Education - 01/28/19 1631    Education Details  Exam findings, POC, HEP, activity level    Person(s) Educated  Patient    Methods  Explanation;Demonstration;Verbal cues;Handout    Comprehension  Verbalized understanding;Verbal cues required;Need further instruction;Tactile cues required       PT Short Term Goals - 01/28/19 1644      PT SHORT TERM GOAL #1   Title  Patient will be independent with HEP to maintain progress in physical therapy.    Baseline  Patient given HEP on eval, requires cueing    Time  4    Period  Weeks    Status  New    Target Date  02/25/19        PT Long Term Goals - 01/28/19 1657      PT LONG TERM GOAL #1   Title  Patient will demonstrate improved functional ability as assessed with FOTO of < or = 38% limitation.    Baseline  55% limitation    Time  8    Period  Weeks    Status  New    Target Date  03/25/19      PT LONG TERM GOAL #2   Title  Patient will reported < or = 1-2/10 pain when standing or walking >20 minutes to demonstrate improved functional mobility and community access.    Baseline  10 minutes increased pain    Time  8    Period  Weeks    Status  New     Target Date  03/25/19      PT LONG TERM GOAL #3   Title  Patient will exhibit improve lumbar mobility to [facility] to allow for improved posture with walking and standing and reduce pain with longer periods of those activities.    Baseline  Limited lumbar motion    Time  8    Period  Weeks    Status  New    Target Date  03/25/19             Plan - 01/28/19 1634    Clinical Impression Statement  Patient presents to physical therapy with report  of chronic left sided lower back pain that affects her abilityt to stand up straight and walk when she gets out of bed. Her pain mainly seem muscular, and she exhibits limited mobility of the lumbar spine and she tightness of the anterior hips bilaterally, with pronounced core and hip weakness. She states she has been inactive for the past year and she has also gained some weight which may be contributing to her pain level. She was provided with exercises to improve her mobility and initiate strengthening. She tolerated exercises well this visit and she would benefit from continued skilled PT to imrpove her pain and ability to stand up and walk in better posture.    Personal Factors and Comorbidities  Fitness;Time since onset of injury/illness/exacerbation;Comorbidity 1    Comorbidities  BMI, previous left knee surgery and OA    Examination-Activity Limitations  Bed Mobility;Bend;Carry;Lift;Stand;Stairs;Squat;Sleep;Sit;Locomotion Level    Examination-Participation Restrictions  Cleaning;Dorita Sciara    Stability/Clinical Decision Making  Evolving/Moderate complexity    Clinical Decision Making  Moderate    Rehab Potential  Good    PT Frequency  1x / week    PT Duration  8 weeks    PT Treatment/Interventions  ADLs/Self Care Home Management;Cryotherapy;Electrical Stimulation;Moist Heat;Traction;Ultrasound;Therapeutic exercise;Therapeutic activities;Functional mobility training;Stair training;Gait training;Neuromuscular re-education;Patient/family  education;Dry needling;Passive range of motion;Manual techniques;Taping;Spinal Manipulations;Joint Manipulations    PT Next Visit Plan  Progress lumbar mobility and hip flexibility to allow for improved upright posture, core and hip strengthening    PT Home Exercise Plan  Thomas hip flexor stretch on edge of bed, side lying thoracic/lumbar rotational stretch, clamshell, bridge, supine marching with core activation    Consulted and Agree with Plan of Care  Patient       Patient will benefit from skilled therapeutic intervention in order to improve the following deficits and impairments:  Abnormal gait, Decreased range of motion, Decreased activity tolerance, Pain, Impaired flexibility, Postural dysfunction, Decreased strength  Visit Diagnosis: Chronic left-sided low back pain without sciatica  Muscle weakness (generalized)  Abnormal posture     Problem List Patient Active Problem List   Diagnosis Date Noted  . Contact dermatitis 01/14/2019  . Vaginal itching 12/05/2018  . Pelvic pain 10/07/2018  . Leg edema 08/28/2018  . Thickened endometrium   . Umbilical hernia without obstruction and without gangrene 01/01/2018  . Osteopenia 09/05/2017  . 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 Renal infarct ([place]) 09/30/2015  . HLD (hyperlipidemia)   . Aortic atherosclerosis ([place])   . Back pain 07/08/2015  . 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 Bilateral leg pain 06/14/2015  . Seasonal allergies 03/09/2015  . Hypopigmentation 03/09/2015  . Osteoarthritis of left knee 07/08/2014  . DJD (degenerative joint disease), lumbar 07/08/2014  . Osteoarthritis of right knee 07/08/2014  . Urinary frequency 06/11/2014  . Healthcare maintenance 06/11/2014  . Allergic rhinoconjunctivitis of both eyes 06/11/2014  . Nearsightedness 04/27/2014  . Abnormal EKG   . Hypertension 12/11/2013    Hilda Blades, PT, DPT, LAT, ATC 01/28/19  5:03 PM Phone: [phone] Fax:  [facility] [address] [place], [place], [address] Phone: [phone]   Fax:  [phone]  Name: [name] MRN: [id number] Date of Birth: 01/15/1951

## 2019-02-03 ENCOUNTER — Encounter: Payer: Self-pay | Admitting: Physical Therapy

## 2019-02-03 ENCOUNTER — Other Ambulatory Visit: Payer: Self-pay

## 2019-02-03 ENCOUNTER — Ambulatory Visit: Payer: Medicare Other | Admitting: Physical Therapy

## 2019-02-03 DIAGNOSIS — M545 Low back pain, unspecified: Secondary | ICD-10-CM

## 2019-02-03 DIAGNOSIS — M6281 Muscle weakness (generalized): Secondary | ICD-10-CM

## 2019-02-03 DIAGNOSIS — R293 Abnormal posture: Secondary | ICD-10-CM

## 2019-02-03 DIAGNOSIS — G8929 Other chronic pain: Secondary | ICD-10-CM | POA: Diagnosis not present

## 2019-02-03 NOTE — Therapy (Signed)
[place], [place], [address] Phone: [phone]   Fax:  [phone]  Physical Therapy Treatment  Patient Details  Name: [name] MRN: [id number] Date of Birth: Apr 19, 1950 Referring Provider (PT): Leeanne Rio, MD   Encounter Date: 02/03/2019  PT End of Session - 02/03/19 1215    Visit Number  2    Number of Visits  8    Date for PT Re-Evaluation  03/25/19    Authorization Type  [employer]HC Medicare    PT Start Time  1216    PT Stop Time  1254    PT Time Calculation (min)  38 min    Activity Tolerance  Patient tolerated treatment well       Past Medical History:  Diagnosis Date  . Abnormal EKG 2012   hospitalized for T wave inversion in lateral leads with [facility] chest pains, normal ECHO, negative trops, no cardiology consult. recent EKG 7/15 stable T wave inversions.   . 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 Arthritis 2000  . Arthritis 2011  . Clotting disorder ([place])    blood clot kidney 2017  . Heart murmur   . Hyperlipidemia 2011  . Hypertension 2011  . Meniscus tear 2000   L KNEE  . 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 ABDOMINAL SURGERY  2010   for bowel blockage  . COLON SURGERY  2010   bowel blockage  . COLONOSCOPY  09/2014  . HYSTEROSCOPY W/D&C N/A 05/08/2018   Procedure: DILATATION AND CURETTAGE /HYSTEROSCOPY;  Surgeon: Mora Bellman, MD;  Location: [facility];  Service: Gynecology;  Laterality: N/A;  . KNEE ARTHROSCOPY Left 09/25/2013   Procedure: LEFT KNEE ARTHROSCOPY WITH PARTIAL MEDIAL AND LATERAL  MENISCECTOMY/DEBRIDEMENT/MICRO FRACTURE MEDIAL FEMORAL CONDYLE, REMOVAL OF OSTEOCONDRAL FRAGMENT;  Surgeon: Johnn Hai, MD;  Location: [facility];  Service: Orthopedics;  Laterality: Left;  . KNEE SURGERY Right 1999  . POLYPECTOMY    . TEE WITHOUT CARDIOVERSION N/A 10/04/2015   Procedure:  TRANSESOPHAGEAL ECHOCARDIOGRAM (TEE);  Surgeon: Sueanne Margarita, MD;  Location: [facility] ENDOSCOPY;  Service: Cardiovascular;  Laterality: N/A;  . TUBAL LIGATION  1978    There were no vitals filed for this visit.  Subjective Assessment - 02/03/19 1218    Subjective  Pt reports she is doing her HEP and thinks she is able to walk a little better,  She was even able to rake a little leaves.    Patient Stated Goals  Get rid of pain so she can walk straight    Currently in Pain?  No/denies         [facility] PT Assessment - 02/03/19 0001      Assessment   Medical Diagnosis  Left sided low back pain    Referring Provider (PT)  Leeanne Rio, MD                   [facility] Adult PT Treatment/Exercise - 02/03/19 0001      Lumbar Exercises: Stretches   Single Knee to Chest Stretch  Left;Right;3 reps;30 seconds   with leg press on straight leg   Press Ups  10 reps   modified ROM   Quad Stretch  Left;Right;3 reps;30 seconds   prone with strap     Lumbar Exercises: Aerobic   Nustep  L4x5' U/LE  to warm up  Lumbar Exercises: Seated   Sit to Stand  20 reps   with yellow band around knees to engage gluts more   Other Seated Lumbar Exercises  with FWD reach holding 6# 2x10, then 10 chops to each side      Lumbar Exercises: Supine   Clam  10 reps   with TA engagement, yellow band bilat then single    Bridge  20 reps    Other Supine Lumbar Exercises  10 reps PPT with single leg press outs    Other Supine Lumbar Exercises  5x10sec leg lentheners with VC for form      Lumbar Exercises: Prone   Straight Leg Raise  10 reps    Straight Leg Raises Limitations  verbal and physical cues for form very challengin especiall on Lt side.     Other Prone Lumbar Exercises  POE then 10 press ups               PT Short Term Goals - 01/28/19 1644      PT SHORT TERM GOAL #1   Title  Patient will be independent with HEP to maintain progress in physical therapy.    Baseline   Patient given HEP on eval, requires cueing    Time  4    Period  Weeks    Status  New    Target Date  02/25/19        PT Long Term Goals - 01/28/19 1657      PT LONG TERM GOAL #1   Title  Patient will demonstrate improved functional ability as assessed with FOTO of < or = 38% limitation.    Baseline  55% limitation    Time  8    Period  Weeks    Status  New    Target Date  03/25/19      PT LONG TERM GOAL #2   Title  Patient will reported < or = 1-2/10 pain when standing or walking >20 minutes to demonstrate improved functional mobility and community access.    Baseline  10 minutes increased pain    Time  8    Period  Weeks    Status  New    Target Date  03/25/19      PT LONG TERM GOAL #3   Title  Patient will exhibit improve lumbar mobility to [facility] to allow for improved posture with walking and standing and reduce pain with longer periods of those activities.    Baseline  Limited lumbar motion    Time  8    Period  Weeks    Status  New    Target Date  03/25/19            Plan - 02/03/19 1249    Clinical Impression Statement  This is [name]'s second visit, she reports doing well with her HEP and feels a little better.  No goals met and her HEP was not changed at this visit.  She is very tight in bilat hip flexors and weak through her gluts.    Rehab Potential  Good    PT Frequency  1x / week    PT Duration  8 weeks    PT Treatment/Interventions  ADLs/Self Care Home Management;Cryotherapy;Electrical Stimulation;Moist Heat;Traction;Ultrasound;Therapeutic exercise;Therapeutic activities;Functional mobility training;Stair training;Gait training;Neuromuscular re-education;Patient/family education;Dry needling;Passive range of motion;Manual techniques;Taping;Spinal Manipulations;Joint Manipulations    PT Next Visit Plan  progress HEP if still doing well, try dead lifts in safe ROM    Consulted and Agree  with Plan of Care  Patient       Patient will benefit from skilled  therapeutic intervention in order to improve the following deficits and impairments:  Abnormal gait, Decreased range of motion, Decreased activity tolerance, Pain, Impaired flexibility, Postural dysfunction, Decreased strength  Visit Diagnosis: Chronic left-sided low back pain without sciatica  Muscle weakness (generalized)  Abnormal posture     Problem List Patient Active Problem List   Diagnosis Date Noted  . Contact dermatitis 01/14/2019  . Vaginal itching 12/05/2018  . Pelvic pain 10/07/2018  . Leg edema 08/28/2018  . Thickened endometrium   . Umbilical hernia without obstruction and without gangrene 01/01/2018  . Osteopenia 09/05/2017  . 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 Renal infarct ([place]) 09/30/2015  . HLD (hyperlipidemia)   . Aortic atherosclerosis ([place])   . Back pain 07/08/2015  . 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 Bilateral leg pain 06/14/2015  . Seasonal allergies 03/09/2015  . Hypopigmentation 03/09/2015  . Osteoarthritis of left knee 07/08/2014  . DJD (degenerative joint disease), lumbar 07/08/2014  . Osteoarthritis of right knee 07/08/2014  . Urinary frequency 06/11/2014  . Healthcare maintenance 06/11/2014  . Allergic rhinoconjunctivitis of both eyes 06/11/2014  . Nearsightedness 04/27/2014  . Abnormal EKG   . Hypertension 12/11/2013    Jeral Pinch PT  02/03/2019, 12:56 PM  [facility] [address] [place], [place], [address] Phone: [phone]   Fax:  [phone]  Name: Sarahanne Novakowski MRN: [id number] Date of Birth: Jul 26, 1950

## 2019-02-10 ENCOUNTER — Encounter: Payer: Self-pay | Admitting: Physical Therapy

## 2019-02-10 ENCOUNTER — Other Ambulatory Visit: Payer: Self-pay

## 2019-02-10 ENCOUNTER — Ambulatory Visit: Payer: Medicare Other | Admitting: Physical Therapy

## 2019-02-10 DIAGNOSIS — M6281 Muscle weakness (generalized): Secondary | ICD-10-CM

## 2019-02-10 DIAGNOSIS — G8929 Other chronic pain: Secondary | ICD-10-CM

## 2019-02-10 DIAGNOSIS — R293 Abnormal posture: Secondary | ICD-10-CM

## 2019-02-10 DIAGNOSIS — M545 Low back pain, unspecified: Secondary | ICD-10-CM

## 2019-02-10 NOTE — Therapy (Signed)
[place], [place], [address] Phone: [phone]   Fax:  [phone]  Physical Therapy Treatment  Patient Details  Name: [name] MRN: [id number] Date of Birth: Jun 04, 1950 Referring Provider (PT): Leeanne Rio, MD   Encounter Date: 02/10/2019  PT End of Session - 02/10/19 1213    Visit Number  3    Number of Visits  8    Date for PT Re-Evaluation  03/25/19    Authorization Type  [employer] Medicare    PT Start Time  1213    PT Stop Time  1255    PT Time Calculation (min)  42 min    Activity Tolerance  Patient tolerated treatment well    Behavior During Therapy  [facility] for tasks assessed/performed       Past Medical History:  Diagnosis Date  . Abnormal EKG 2012   hospitalized for T wave inversion in lateral leads with [facility]K chest pains, normal ECHO, negative trops, no cardiology consult. recent EKG 7/15 stable T wave inversions.   . 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 Arthritis 2000  . Arthritis 2011  . Clotting disorder ([place])    blood clot kidney 2017  . Heart murmur   . Hyperlipidemia 2011  . Hypertension 2011  . Meniscus tear 2000   L KNEE  . 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 ABDOMINAL SURGERY  2010   for bowel blockage  . COLON SURGERY  2010   bowel blockage  . COLONOSCOPY  09/2014  . HYSTEROSCOPY W/D&C N/A 05/08/2018   Procedure: DILATATION AND CURETTAGE /HYSTEROSCOPY;  Surgeon: Mora Bellman, MD;  Location: [place];  Service: Gynecology;  Laterality: N/A;  . KNEE ARTHROSCOPY Left 09/25/2013   Procedure: LEFT KNEE ARTHROSCOPY WITH PARTIAL MEDIAL AND LATERAL  MENISCECTOMY/DEBRIDEMENT/MICRO FRACTURE MEDIAL FEMORAL CONDYLE, REMOVAL OF OSTEOCONDRAL FRAGMENT;  Surgeon: Johnn Hai, MD;  Location: [facility];  Service: Orthopedics;  Laterality: Left;  . KNEE SURGERY Right 1999  . POLYPECTOMY    .  TEE WITHOUT CARDIOVERSION N/A 10/04/2015   Procedure: TRANSESOPHAGEAL ECHOCARDIOGRAM (TEE);  Surgeon: Sueanne Margarita, MD;  Location: [facility] ENDOSCOPY;  Service: Cardiovascular;  Laterality: N/A;  . TUBAL LIGATION  1978    There were no vitals filed for this visit.  Subjective Assessment - 02/10/19 1213    Subjective  Pt reports she may have overdone it this weekend.  Her Rt leg and back are sore today.    Patient Stated Goals  Get rid of pain so she can walk straight    Currently in Pain?  Yes    Pain Score  6     Pain Location  Back    Pain Orientation  Right    Pain Descriptors / Indicators  Tightness;Sore;Aching    Pain Onset  More than a month ago    Pain Frequency  Intermittent    Aggravating Factors   first thing in morning and when she does to much    Pain Relieving Factors  heat and OTC meds                       [facility] Adult PT Treatment/Exercise - 02/10/19 0001      Lumbar Exercises: Stretches   Passive Hamstring Stretch  Left;Right;30 seconds   supine with strap   Single Knee to Chest Stretch  Left;Right;30 seconds  Hip Flexor Stretch  Left;Right;3 reps;30 seconds   seated on edge of mat    ITB Stretch  Left;Right;30 seconds   supine with strap cross body     Lumbar Exercises: Aerobic   Nustep  L4x5' U/LE  to warm up       Lumbar Exercises: Standing   Other Standing Lumbar Exercises  3x10 dead lifts with blue band    Other Standing Lumbar Exercises  3x10 side bending with blue band.  VC for form and to not flex trunk      Lumbar Exercises: Seated   Sit to Stand Limitations  3x10 with heel raises and exaggetrated hip extension, pt required a lot of vc for form      Lumbar Exercises: Supine   Straight Leg Raise  10 reps    Straight Leg Raises Limitations  with TA contraction             PT Education - 02/10/19 1232    Education Details  HEP progression    Person(s) Educated  Patient    Methods  Explanation;Demonstration;Verbal cues     Comprehension  Verbal cues required;Returned demonstration;Verbalized understanding       PT Short Term Goals - 02/10/19 1239      PT SHORT TERM GOAL #1   Title  Patient will be independent with HEP to maintain progress in physical therapy.    Baseline  I with initial HEP    Status  On-going      PT SHORT TERM GOAL #2   Title  "Pt will ambulate with LRAD and without limp    Baseline  pt ambulating without AD however does have small limp    Status  Partially Met      PT SHORT TERM GOAL #3   Title  ------        PT Long Term Goals - 02/10/19 1240      PT LONG TERM GOAL #1   Title  Patient will demonstrate improved functional ability as assessed with FOTO of < or = 38% limitation.    Status  On-going      PT LONG TERM GOAL #2   Title  Patient will reported < or = 1-2/10 pain when standing or walking >20 minutes to demonstrate improved functional mobility and community access.    Status  On-going      PT LONG TERM GOAL #3   Title  Patient will exhibit improve lumbar mobility to [facility] to allow for improved posture with walking and standing and reduce pain with longer periods of those activities.    Status  On-going      PT LONG TERM GOAL #4   Title  -----------    Status  On-going      PT LONG TERM GOAL #5   Title  ---------            Plan - 02/10/19 1252    Clinical Impression Statement  Chesley tolerated new HEP well, she did require quite a few verbal cues for form.  She would benefit from a review of these.  If still having trouble with form they need to be changed.  Making progress to her goals.  Continues to be weak through her hips and core and very tight in the hip flexors.    Rehab Potential  Good    PT Frequency  1x / week    PT Treatment/Interventions  ADLs/Self Care Home Management;Cryotherapy;Electrical Stimulation;Moist Heat;Traction;Ultrasound;Therapeutic exercise;Therapeutic activities;Functional mobility training;Stair training;Gait  training;Neuromuscular re-education;Patient/family education;Dry needling;Passive range of motion;Manual techniques;Taping;Spinal Manipulations;Joint Manipulations    PT Next Visit Plan  review HEP modify if needed.    Consulted and Agree with Plan of Care  Patient       Patient will benefit from skilled therapeutic intervention in order to improve the following deficits and impairments:  Abnormal gait, Decreased range of motion, Decreased activity tolerance, Pain, Impaired flexibility, Postural dysfunction, Decreased strength  Visit Diagnosis: Chronic left-sided low back pain without sciatica  Muscle weakness (generalized)  Abnormal posture     Problem List Patient Active Problem List   Diagnosis Date Noted  . Contact dermatitis 01/14/2019  . Vaginal itching 12/05/2018  . Pelvic pain 10/07/2018  . Leg edema 08/28/2018  . Thickened endometrium   . Umbilical hernia without obstruction and without gangrene 01/01/2018  . Osteopenia 09/05/2017  . 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 Renal infarct ([place]) 09/30/2015  . HLD (hyperlipidemia)   . Aortic atherosclerosis ([place])   . Back pain 07/08/2015  . 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 Bilateral leg pain 06/14/2015  . Seasonal allergies 03/09/2015  . Hypopigmentation 03/09/2015  . Osteoarthritis of left knee 07/08/2014  . DJD (degenerative joint disease), lumbar 07/08/2014  . Osteoarthritis of right knee 07/08/2014  . Urinary frequency 06/11/2014  . Healthcare maintenance 06/11/2014  . Allergic rhinoconjunctivitis of both eyes 06/11/2014  . Nearsightedness 04/27/2014  . Abnormal EKG   . Hypertension 12/11/2013    Boneta Lucks rPT  02/10/2019, 12:55 PM  [facility] [address] [place], [place], [address] Phone: [phone]   Fax:  [phone]  Name: Angelee Bahr MRN: [id number] Date of Birth: June 23, 1950

## 2019-02-17 ENCOUNTER — Ambulatory Visit: Payer: Medicare Other | Admitting: Physical Therapy

## 2019-02-17 ENCOUNTER — Encounter: Payer: Self-pay | Admitting: Physical Therapy

## 2019-02-17 ENCOUNTER — Other Ambulatory Visit: Payer: Self-pay

## 2019-02-17 DIAGNOSIS — R293 Abnormal posture: Secondary | ICD-10-CM

## 2019-02-17 DIAGNOSIS — M6281 Muscle weakness (generalized): Secondary | ICD-10-CM | POA: Diagnosis not present

## 2019-02-17 DIAGNOSIS — M545 Low back pain: Secondary | ICD-10-CM | POA: Diagnosis not present

## 2019-02-17 DIAGNOSIS — G8929 Other chronic pain: Secondary | ICD-10-CM

## 2019-02-17 NOTE — Therapy (Signed)
[name], [place], [address] Phone: [phone]   Fax:  [phone]  Physical Therapy Treatment  Patient Details  Name: [name] MRN: [id number] Date of Birth: 01/28/1951 Referring Provider (PT): Leeanne Rio, MD   Encounter Date: 02/17/2019  PT End of Session - 02/17/19 1229    Visit Number  4    Number of Visits  8    Date for PT Re-Evaluation  03/25/19    Authorization Type  [employer]HC Medicare    PT Start Time  1215    PT Stop Time  1258    PT Time Calculation (min)  43 min    Activity Tolerance  Patient tolerated treatment well    Behavior During Therapy  [facility] for tasks assessed/performed       Past Medical History:  Diagnosis Date  . Abnormal EKG 2012   hospitalized for T wave inversion in lateral leads with [facility] chest pains, normal ECHO, negative trops, no cardiology consult. recent EKG 7/15 stable T wave inversions.   . 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 Arthritis 2000  . Arthritis 2011  . Clotting disorder ([place])    blood clot kidney 2017  . Heart murmur   . Hyperlipidemia 2011  . Hypertension 2011  . Meniscus tear 2000   L KNEE  . 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 ABDOMINAL SURGERY  2010   for bowel blockage  . COLON SURGERY  2010   bowel blockage  . COLONOSCOPY  09/2014  . HYSTEROSCOPY W/D&C N/A 05/08/2018   Procedure: DILATATION AND CURETTAGE /HYSTEROSCOPY;  Surgeon: Mora Bellman, MD;  Location: [place];  Service: Gynecology;  Laterality: N/A;  . KNEE ARTHROSCOPY Left 09/25/2013   Procedure: LEFT KNEE ARTHROSCOPY WITH PARTIAL MEDIAL AND LATERAL  MENISCECTOMY/DEBRIDEMENT/MICRO FRACTURE MEDIAL FEMORAL CONDYLE, REMOVAL OF OSTEOCONDRAL FRAGMENT;  Surgeon: Johnn Hai, MD;  Location: [facility];  Service: Orthopedics;  Laterality: Left;  . KNEE SURGERY Right 1999  . POLYPECTOMY    .  TEE WITHOUT CARDIOVERSION N/A 10/04/2015   Procedure: TRANSESOPHAGEAL ECHOCARDIOGRAM (TEE);  Surgeon: Sueanne Margarita, MD;  Location: [facility]COPY;  Service: Cardiovascular;  Laterality: N/A;  . TUBAL LIGATION  1978    There were no vitals filed for this visit.  Subjective Assessment - 02/17/19 1217    Subjective  Patient reports she is doing well. She states her back is doing somewhat better.    Currently in Pain?  Yes    Pain Score  4     Pain Location  Back    Pain Orientation  Right    Pain Descriptors / Indicators  Tightness;Sore    Pain Type  Chronic pain    Pain Onset  More than a month ago    Pain Frequency  Intermittent         [facility] PT Assessment - 02/17/19 0001      AROM   AROM Assessment Site  Lumbar    Lumbar Flexion  WFL    Lumbar Extension  WFL    Lumbar - Right Side Bend  [facility]    Lumbar - Left Side Bend  [facility]    Lumbar - Right Rotation  75%    Lumbar - Left Rotation  75%      Strength   Right/Left Hip  Right;Left    Right Hip Flexion  4/5  Right Hip Extension  4-/5    Right Hip ABduction  3+/5    Left Hip Flexion  4/5    Left Hip Extension  4-/5    Left Hip ABduction  3+/5      Flexibility   Quadriceps  Limited   hip flexor tightness                  [facility] Adult PT Treatment/Exercise - 02/17/19 0001      Exercises   Exercises  Lumbar      Lumbar Exercises: Stretches   Passive Hamstring Stretch  2 reps;30 seconds    Passive Hamstring Stretch Limitations  supine with strap    Single Knee to Chest Stretch  2 reps;30 seconds    Single Knee to Chest Stretch Limitations  supine    Hip Flexor Stretch  2 reps;30 seconds    Hip Flexor Stretch Limitations  supine edge of bed    Piriformis Stretch  2 reps;30 seconds    Piriformis Stretch Limitations  supine figure-4    Figure 4 Stretch  2 reps;30 seconds    Figure 4 Stretch Limitations  supine    Other Lumbar Stretch Exercise  Supine lower trunk rotation x10 5 sec hold    Other Lumbar  Stretch Exercise  Side lying thoracic lumbar open book rotation stretch x10 5 sec hold      Lumbar Exercises: Aerobic   Nustep  L4 x5' U/LE       Lumbar Exercises: Standing   Other Standing Lumbar Exercises  x10 dead lifts with blue band, cues for proper hip hinge technique and range of motion      Lumbar Exercises: Seated   Sit to Stand  --   3x10   Sit to Stand Limitations  without use of UE      Lumbar Exercises: Supine   Bridge  10 reps;5 seconds    Straight Leg Raise  10 reps    Straight Leg Raises Limitations  with TA contraction      Lumbar Exercises: Sidelying   Clam  15 reps   2 sets            PT Education - 02/17/19 1228    Education Details  HEP consistency    Person(s) Educated  Patient    Methods  Explanation;Demonstration;Verbal cues    Comprehension  Verbalized understanding;Returned demonstration;Verbal cues required;Need further instruction       PT Short Term Goals - 02/17/19 1250      PT SHORT TERM GOAL #1   Title  Patient will be independent with HEP to maintain progress in physical therapy.    Baseline  I with initial HEP    Time  4    Period  Weeks    Status  On-going    Target Date  02/25/19      PT SHORT TERM GOAL #2   Status  --      PT SHORT TERM GOAL #3   Title  ------        PT Long Term Goals - 02/17/19 1305      PT LONG TERM GOAL #1   Title  Patient will demonstrate improved functional ability as assessed with FOTO of < or = 38% limitation.    Baseline  55% limitation    Time  8    Period  Weeks    Status  On-going    Target Date  03/25/19      PT LONG  TERM GOAL #2   Title  Patient will reported < or = 1-2/10 pain when standing or walking >20 minutes to demonstrate improved functional mobility and community access.    Baseline  10 minutes increased pain    Time  8    Period  Weeks    Status  On-going    Target Date  03/25/19      PT LONG TERM GOAL #3   Title  Patient will exhibit improve lumbar mobility to [facility]  to allow for improved posture with walking and standing and reduce pain with longer periods of those activities.    Baseline  Limited lumbar motion    Time  8    Period  Weeks    Status  On-going    Target Date  03/25/19      PT LONG TERM GOAL #4   Status  --            Plan - 02/17/19 1230    Clinical Impression Statement  Patient tolerated therapy well and seems to be improving and progressing toward goals. She continues to exhibit hip flexor tightness and hip/core weakness that are contributing to postural deviations and pain. She does note improvement in being able to walk/stand more upright and less pain. She would benefit from continued skilled PT to progress her strength and flexibility to reduce pain and allow for improved walking ability.    PT Treatment/Interventions  ADLs/Self Care Home Management;Cryotherapy;Electrical Stimulation;Moist Heat;Traction;Ultrasound;Therapeutic exercise;Therapeutic activities;Functional mobility training;Stair training;Gait training;Neuromuscular re-education;Patient/family education;Dry needling;Passive range of motion;Manual techniques;Taping;Spinal Manipulations;Joint Manipulations    PT Next Visit Plan  review HEP modify if needed, progress hip and core strengthening, hip flexibility    PT Home Exercise Plan  Thomas hip flexor stretch on edge of bed, side lying thoracic/lumbar rotational stretch (or supine LTR), side clamshell, bridge, supine marching with core activation, sit-to-stand without UE, resisted side bend and dead lift with band    Consulted and Agree with Plan of Care  Patient       Patient will benefit from skilled therapeutic intervention in order to improve the following deficits and impairments:  Abnormal gait, Decreased range of motion, Decreased activity tolerance, Pain, Impaired flexibility, Postural dysfunction, Decreased strength  Visit Diagnosis: Chronic left-sided low back pain without sciatica  Muscle weakness  (generalized)  Abnormal posture     Problem List Patient Active Problem List   Diagnosis Date Noted  . Contact dermatitis 01/14/2019  . Vaginal itching 12/05/2018  . Pelvic pain 10/07/2018  . Leg edema 08/28/2018  . Thickened endometrium   . Umbilical hernia without obstruction and without gangrene 01/01/2018  . Osteopenia 09/05/2017  . 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 Renal infarct ([place]) 09/30/2015  . HLD (hyperlipidemia)   . Aortic atherosclerosis ([place])   . Back pain 07/08/2015  . 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 Bilateral leg pain 06/14/2015  . Seasonal allergies 03/09/2015  . Hypopigmentation 03/09/2015  . Osteoarthritis of left knee 07/08/2014  . DJD (degenerative joint disease), lumbar 07/08/2014  . Osteoarthritis of right knee 07/08/2014  . Urinary frequency 06/11/2014  . Healthcare maintenance 06/11/2014  . Allergic rhinoconjunctivitis of both eyes 06/11/2014  . Nearsightedness 04/27/2014  . Abnormal EKG   . Hypertension 12/11/2013    Hilda Blades, PT, DPT, LAT, ATC 02/17/19  1:19 PM Phone: [phone] Fax: [facility] [address] [place], [place], [address] Phone: [phone]   Fax:  [phone]  Name: [name] MRN: [id number] Date of Birth: [birth year]

## 2019-02-24 ENCOUNTER — Other Ambulatory Visit: Payer: Self-pay

## 2019-02-24 ENCOUNTER — Encounter: Payer: Self-pay | Admitting: Physical Therapy

## 2019-02-24 ENCOUNTER — Ambulatory Visit: Payer: Medicare Other | Admitting: Physical Therapy

## 2019-02-24 ENCOUNTER — Other Ambulatory Visit: Payer: Self-pay | Admitting: Family Medicine

## 2019-02-24 DIAGNOSIS — M6281 Muscle weakness (generalized): Secondary | ICD-10-CM | POA: Diagnosis not present

## 2019-02-24 DIAGNOSIS — M545 Low back pain, unspecified: Secondary | ICD-10-CM

## 2019-02-24 DIAGNOSIS — E785 Hyperlipidemia, unspecified: Secondary | ICD-10-CM

## 2019-02-24 DIAGNOSIS — R293 Abnormal posture: Secondary | ICD-10-CM | POA: Diagnosis not present

## 2019-02-24 DIAGNOSIS — G8929 Other chronic pain: Secondary | ICD-10-CM | POA: Diagnosis not present

## 2019-02-24 NOTE — Therapy (Signed)
[place], [place], [address] Phone: [phone]   Fax:  [phone]  Physical Therapy Treatment  Patient Details  Name: Kwanisha Seufert MRN: [id number] Date of Birth: July 07, 1950 Referring Provider (PT): Leeanne Rio, MD   Encounter Date: 02/24/2019  PT End of Session - 02/24/19 1223    Visit Number  5    Number of Visits  8    Date for PT Re-Evaluation  03/25/19    Authorization Type  [employer]HC Medicare    PT Start Time  1215    PT Stop Time  1255    PT Time Calculation (min)  40 min    Activity Tolerance  Patient tolerated treatment well    Behavior During Therapy  [facility] for tasks assessed/performed       Past Medical History:  Diagnosis Date  . Abnormal EKG 2012   hospitalized for T wave inversion in lateral leads with MSK chest pains, normal ECHO, negative trops, no cardiology consult. recent EKG 7/15 stable T wave inversions.   . 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 Arthritis 2000  . Arthritis 2011  . Clotting disorder ([place])    blood clot kidney 2017  . Heart murmur   . Hyperlipidemia 2011  . Hypertension 2011  . Meniscus tear 2000   L KNEE  . 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 ABDOMINAL SURGERY  2010   for bowel blockage  . COLON SURGERY  2010   bowel blockage  . COLONOSCOPY  09/2014  . HYSTEROSCOPY W/D&C N/A 05/08/2018   Procedure: DILATATION AND CURETTAGE /HYSTEROSCOPY;  Surgeon: Mora Bellman, MD;  Location: [facility];  Service: Gynecology;  Laterality: N/A;  . KNEE ARTHROSCOPY Left 09/25/2013   Procedure: LEFT KNEE ARTHROSCOPY WITH PARTIAL MEDIAL AND LATERAL  MENISCECTOMY/DEBRIDEMENT/MICRO FRACTURE MEDIAL FEMORAL CONDYLE, REMOVAL OF OSTEOCONDRAL FRAGMENT;  Surgeon: Johnn Hai, MD;  Location: [facility];  Service: Orthopedics;  Laterality: Left;  . KNEE SURGERY Right 1999  . POLYPECTOMY    .  TEE WITHOUT CARDIOVERSION N/A 10/04/2015   Procedure: TRANSESOPHAGEAL ECHOCARDIOGRAM (TEE);  Surgeon: Sueanne Margarita, MD;  Location: [facility] ENDOSCOPY;  Service: Cardiovascular;  Laterality: N/A;  . TUBAL LIGATION  1978    There were no vitals filed for this visit.  Subjective Assessment - 02/24/19 1219    Subjective  Patient reports she is doing well. She reports she was moving around more this week and she walked around the neighborhood.    Currently in Pain?  Yes    Pain Score  6     Pain Location  Back    Pain Orientation  Right;Lower    Pain Descriptors / Indicators  Aching    Pain Type  Chronic pain    Pain Onset  More than a month ago    Pain Frequency  Intermittent         [facility] PT Assessment - 02/24/19 0001      AROM   AROM Assessment Site  Lumbar    Lumbar Flexion  WFL    Lumbar Extension  WFL    Lumbar - Right Side Bend  [facility]    Lumbar - Left Side Bend  [facility]    Lumbar - Right Rotation  75%    Lumbar - Left Rotation  75%  [facility] Adult PT Treatment/Exercise - 02/24/19 0001      Exercises   Exercises  Lumbar      Lumbar Exercises: Stretches   Hip Flexor Stretch  2 reps;30 seconds    Hip Flexor Stretch Limitations  supine edge of bed    Piriformis Stretch  2 reps;30 seconds    Piriformis Stretch Limitations  supine figure-4    Figure 4 Stretch  2 reps;30 seconds    Figure 4 Stretch Limitations  supine    Other Lumbar Stretch Exercise  Supine lower trunk rotation x10 5 sec hold    Other Lumbar Stretch Exercise  Side lying thoracic lumbar open book rotation stretch x10 5 sec hold      Lumbar Exercises: Aerobic   Nustep  L5 x5' U/LE       Lumbar Exercises: Standing   Other Standing Lumbar Exercises  3x10 dead lifts with 10# kettlebell   cued for proper hip hinge technique   Other Standing Lumbar Exercises  Hip abduction, extension, marching 2x10 each with 4# ankle weight      Lumbar Exercises: Supine   Bridge  10 reps;3 seconds   2  sets   Bridge Limitations  red band around knees      Lumbar Exercises: Sidelying   Clam  15 reps   2 sets   Clam Limitations  red band around knees             PT Education - 02/24/19 1221    Education Details  HEP    Person(s) Educated  Patient    Methods  Explanation;Demonstration;Verbal cues    Comprehension  Verbalized understanding;Returned demonstration;Verbal cues required       PT Short Term Goals - 02/24/19 1229      PT SHORT TERM GOAL #1   Title  Patient will be independent with HEP to maintain progress in physical therapy.    Baseline  I with initial HEP    Time  4    Period  Weeks    Status  Achieved    Target Date  02/25/19      PT SHORT TERM GOAL #3   Title  ------        PT Long Term Goals - 02/24/19 1230      PT LONG TERM GOAL #1   Title  Patient will demonstrate improved functional ability as assessed with FOTO of < or = 38% limitation.    Baseline  55% limitation    Time  8    Period  Weeks    Status  On-going    Target Date  03/25/19      PT LONG TERM GOAL #2   Title  Patient will reported < or = 1-2/10 pain when standing or walking >20 minutes to demonstrate improved functional mobility and community access.    Baseline  10 minutes increased pain    Time  8    Period  Weeks    Status  On-going    Target Date  03/25/19      PT LONG TERM GOAL #3   Title  Patient will exhibit improve lumbar mobility to [facility] to allow for improved posture with walking and standing and reduce pain with longer periods of those activities.    Baseline  Limited lumbar motion    Time  8    Period  Weeks    Status  On-going    Target Date  03/25/19  Plan - 02/24/19 1223    Clinical Impression Statement  Patient is continuing to progress well with her strengthening exercises and seems to be improving in flexibility and motion. She reported improvement in her walking ability and she was able to walk more this past weekend without significant  increase in pain. She would benefit from continued skilled PT to progress her strength and mobility to improve posture and waling ability.    PT Treatment/Interventions  ADLs/Self Care Home Management;Cryotherapy;Electrical Stimulation;Moist Heat;Traction;Ultrasound;Therapeutic exercise;Therapeutic activities;Functional mobility training;Stair training;Gait training;Neuromuscular re-education;Patient/family education;Dry needling;Passive range of motion;Manual techniques;Taping;Spinal Manipulations;Joint Manipulations    PT Next Visit Plan  review HEP modify if needed, progress hip and core strengthening, hip flexibility    PT Home Exercise Plan  Thomas hip flexor stretch on edge of bed, side lying thoracic/lumbar rotational stretch (or supine LTR), side clamshell, bridge, supine marching with core activation, sit-to-stand without UE, resisted side bend and dead lift with band    Consulted and Agree with Plan of Care  Patient       Patient will benefit from skilled therapeutic intervention in order to improve the following deficits and impairments:  Abnormal gait, Decreased range of motion, Decreased activity tolerance, Pain, Impaired flexibility, Postural dysfunction, Decreased strength  Visit Diagnosis: Chronic left-sided low back pain without sciatica  Muscle weakness (generalized)  Abnormal posture     Problem List Patient Active Problem List   Diagnosis Date Noted  . Contact dermatitis 01/14/2019  . Vaginal itching 12/05/2018  . Pelvic pain 10/07/2018  . Leg edema 08/28/2018  . Thickened endometrium   . Umbilical hernia without obstruction and without gangrene 01/01/2018  . Osteopenia 09/05/2017  . 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 Renal infarct ([place]) 09/30/2015  . HLD (hyperlipidemia)   . Aortic atherosclerosis ([place])   . Back pain 07/08/2015  . 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 Bilateral leg pain 06/14/2015  . Seasonal allergies 03/09/2015  .  Hypopigmentation 03/09/2015  . Osteoarthritis of left knee 07/08/2014  . DJD (degenerative joint disease), lumbar 07/08/2014  . Osteoarthritis of right knee 07/08/2014  . Urinary frequency 06/11/2014  . Healthcare maintenance 06/11/2014  . Allergic rhinoconjunctivitis of both eyes 06/11/2014  . Nearsightedness 04/27/2014  . Abnormal EKG   . Hypertension 12/11/2013    Hilda Blades, PT, DPT, LAT, ATC 02/24/19  1:01 PM Phone: [phone] Fax: [facility] [address] [place], [place], [address] Phone: [phone]   Fax:  [phone]  Name: Nyjae Culliver MRN: [id number] Date of Birth: [birth year]

## 2019-03-04 ENCOUNTER — Encounter: Payer: Medicare Other | Admitting: Physical Therapy

## 2019-03-07 ENCOUNTER — Telehealth: Payer: Self-pay | Admitting: *Deleted

## 2019-03-07 NOTE — Telephone Encounter (Signed)
Pt lm on nurse line requesting a "BP kit order".  LMOVM for pt to return call.  To my knowledge insurance does not cover BP machines.  Christen Bame, CMA

## 2019-03-14 ENCOUNTER — Other Ambulatory Visit: Payer: Self-pay

## 2019-03-14 ENCOUNTER — Encounter: Payer: Self-pay | Admitting: Physical Therapy

## 2019-03-14 ENCOUNTER — Ambulatory Visit: Payer: Medicare Other | Attending: Family Medicine | Admitting: Physical Therapy

## 2019-03-14 DIAGNOSIS — M545 Low back pain: Secondary | ICD-10-CM | POA: Insufficient documentation

## 2019-03-14 DIAGNOSIS — R293 Abnormal posture: Secondary | ICD-10-CM | POA: Diagnosis not present

## 2019-03-14 DIAGNOSIS — G8929 Other chronic pain: Secondary | ICD-10-CM | POA: Diagnosis not present

## 2019-03-14 DIAGNOSIS — M6281 Muscle weakness (generalized): Secondary | ICD-10-CM

## 2019-03-14 NOTE — Therapy (Addendum)
[place], [place], [address] Phone: [phone]   Fax:  [phone]  Physical Therapy Treatment and Discharge  Patient Details  Name: [name] MRN: [id number] Date of Birth: 12/25/1950 Referring Provider (PT): Leeanne Rio, MD   Encounter Date: 03/14/2019  PT End of Session - 03/14/19 1008    Visit Number  6    Number of Visits  8    Date for PT Re-Evaluation  03/25/19    Authorization Type  [employer]HC Medicare    PT Start Time  1001    PT Stop Time  1043    PT Time Calculation (min)  42 min    Activity Tolerance  Patient tolerated treatment well    Behavior During Therapy  [facility] for tasks assessed/performed       Past Medical History:  Diagnosis Date  . Abnormal EKG 2012   hospitalized for T wave inversion in lateral leads with MSK chest pains, normal ECHO, negative trops, no cardiology consult. recent EKG 7/15 stable T wave inversions.   . 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 Arthritis 2000  . Arthritis 2011  . Clotting disorder ([place])    blood clot kidney 2017  . Heart murmur   . Hyperlipidemia 2011  . Hypertension 2011  . Meniscus tear 2000   L KNEE  . 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 ABDOMINAL SURGERY  2010   for bowel blockage  . COLON SURGERY  2010   bowel blockage  . COLONOSCOPY  09/2014  . HYSTEROSCOPY WITH D & C N/A 05/08/2018   Procedure: DILATATION AND CURETTAGE /HYSTEROSCOPY;  Surgeon: Mora Bellman, MD;  Location: [place] [facility];  Service: Gynecology;  Laterality: N/A;  . KNEE ARTHROSCOPY Left 09/25/2013   Procedure: LEFT KNEE ARTHROSCOPY WITH PARTIAL MEDIAL AND LATERAL  MENISCECTOMY/DEBRIDEMENT/MICRO FRACTURE MEDIAL FEMORAL CONDYLE, REMOVAL OF OSTEOCONDRAL FRAGMENT;  Surgeon: Johnn Hai, MD;  Location: [facility];  Service: Orthopedics;  Laterality: Left;  . KNEE SURGERY Right 1999   . POLYPECTOMY    . TEE WITHOUT CARDIOVERSION N/A 10/04/2015   Procedure: TRANSESOPHAGEAL ECHOCARDIOGRAM (TEE);  Surgeon: Sueanne Margarita, MD;  Location: [facility] ENDOSCOPY;  Service: Cardiovascular;  Laterality: N/A;  . TUBAL LIGATION  1978    There were no vitals filed for this visit.  Subjective Assessment - 03/14/19 1014    Subjective  Patient reports she has been doing well. She had to cancel her last appointment due to getting her tooth pulled. She feels much better and feel her walking ability has improved.    How long can you sit comfortably?  No problem    How long can you stand comfortably?  30 minutes    How long can you walk comfortably?  30 minutes - slight increase in low back tightness 1-2/10    Patient Stated Goals  Get rid of pain so she can walk straight    Currently in Pain?  No/denies         [facility] PT Assessment - 03/14/19 0001      Assessment   Medical Diagnosis  Left sided low back pain    Referring Provider (PT)  Leeanne Rio, MD      Observation/Other Assessments   Focus on Therapeutic Outcomes (FOTO)   28% limitation      ROM / Strength   AROM / PROM / Strength  AROM;Strength      AROM   Overall AROM Comments  No pain reported    AROM Assessment Site  Lumbar    Lumbar Flexion  WFL    Lumbar Extension  WFL    Lumbar - Right Side Bend  WFL    Lumbar - Left Side Bend  WFL    Lumbar - Right Rotation  WFL    Lumbar - Left Rotation  [facility]      Strength   Overall Strength Comments  No pain with testing    Right/Left Hip  Right;Left    Right Hip Flexion  4/5    Right Hip Extension  4/5    Right Hip ABduction  4-/5    Left Hip Flexion  4/5    Left Hip Extension  4/5    Left Hip ABduction  4-/5      Flexibility   Hamstrings  WNL    Quadriceps  Limited - improved since eval      Palpation   Palpation comment  No pain reported      Ambulation/Gait   Ambulation/Gait  Yes    Ambulation/Gait Assistance  7: Independent    Gait Comments  Patient  continues to ambulate with slight forward trunk lean                   [facility] Adult PT Treatment/Exercise - 03/14/19 0001      Exercises   Exercises  Lumbar      Lumbar Exercises: Stretches   Hip Flexor Stretch  3 reps;30 seconds    Hip Flexor Stretch Limitations  supine edge of bed    Piriformis Stretch  2 reps;30 seconds    Piriformis Stretch Limitations  supine figure-4    Figure 4 Stretch  2 reps;30 seconds    Figure 4 Stretch Limitations  supine    Other Lumbar Stretch Exercise  Supine lower trunk rotation x10 5 sec hold    Other Lumbar Stretch Exercise  Side lying thoracic lumbar open book rotation stretch x10 5 sec hold      Lumbar Exercises: Aerobic   Nustep  L6 x5' U/LE       Lumbar Exercises: Standing   Other Standing Lumbar Exercises  3x10 dead lifts with 10# kettlebell    Other Standing Lumbar Exercises  Hip abduction, extension, marching 2x10 each with 4# ankle weight      Lumbar Exercises: Seated   Sit to Stand  10 reps   3 sets   Sit to Stand Limitations  without use of UE      Lumbar Exercises: Sidelying   Clam  15 reps   2 sets   Clam Limitations  red band around knees             PT Education - 03/14/19 1008    Education Details  HEP    Person(s) Educated  Patient    Methods  Explanation;Demonstration;Verbal cues    Comprehension  Verbalized understanding;Returned demonstration;Verbal cues required;Need further instruction       PT Short Term Goals - 03/14/19 1012      PT SHORT TERM GOAL #1   Title  Patient will be independent with HEP to maintain progress in physical therapy.    Baseline  I with initial HEP    Time  4    Period  Weeks    Status  Achieved    Target Date  02/25/19      PT SHORT TERM GOAL #  3   Title  ------        PT Long Term Goals - 03/14/19 1012      PT LONG TERM GOAL #1   Title  Patient will demonstrate improved functional ability as assessed with FOTO of < or = 38% limitation.    Baseline  55%  limitation    Time  8    Period  Weeks    Status  Achieved      PT LONG TERM GOAL #2   Title  Patient will reported < or = 1-2/10 pain when standing or walking >20 minutes to demonstrate improved functional mobility and community access.    Baseline  10 minutes increased pain    Time  8    Period  Weeks    Status  Achieved      PT LONG TERM GOAL #3   Title  Patient will exhibit improve lumbar mobility to [facility] to allow for improved posture with walking and standing and reduce pain with longer periods of those activities.    Baseline  Limited lumbar motion    Time  8    Period  Weeks    Status  Achieved            Plan - 03/14/19 1009    Clinical Impression Statement  Patient exhibits improved functional ability and improve lumbar motion with less pain. She is ready to transition to independent exercise at home and she will work on her walking program to continue improvement. She will be discharged this visit.    PT Next Visit Plan  NA - discharge    PT Home Exercise Plan  Thomas hip flexor stretch on edge of bed, side lying thoracic/lumbar rotational stretch (or supine LTR), side clamshell, bridge, supine marching with core activation, sit-to-stand without UE, resisted side bend and dead lift with band    Consulted and Agree with Plan of Care  Patient       Patient will benefit from skilled therapeutic intervention in order to improve the following deficits and impairments:     Visit Diagnosis: Chronic left-sided low back pain without sciatica  Muscle weakness (generalized)  Abnormal posture     Problem List Patient Active Problem List   Diagnosis Date Noted  . Contact dermatitis 01/14/2019  . Vaginal itching 12/05/2018  . Pelvic pain 10/07/2018  . Leg edema 08/28/2018  . Thickened endometrium   . Umbilical hernia without obstruction and without gangrene 01/01/2018  . Osteopenia 09/05/2017  . 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 Renal infarct ([place]) 09/30/2015   . HLD (hyperlipidemia)   . Aortic atherosclerosis ([place])   . Back pain 07/08/2015  . 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 Bilateral leg pain 06/14/2015  . Seasonal allergies 03/09/2015  . Hypopigmentation 03/09/2015  . Osteoarthritis of left knee 07/08/2014  . DJD (degenerative joint disease), lumbar 07/08/2014  . Osteoarthritis of right knee 07/08/2014  . Urinary frequency 06/11/2014  . Healthcare maintenance 06/11/2014  . Allergic rhinoconjunctivitis of both eyes 06/11/2014  . Nearsightedness 04/27/2014  . Abnormal EKG   . Hypertension 12/11/2013    Hilda Blades, PT, DPT, LAT, ATC 03/14/19  10:46 AM Phone: [phone] Fax: [facility] [address] [place], [place], [address] Phone: [phone]   Fax:  [phone]  Name: [name] MRN: [id number] Date of Birth: [birth year]  PHYSICAL THERAPY DISCHARGE SUMMARY  Visits from Start of Care: 6  Current functional  level related to goals / functional outcomes: Patient has achieved all goals, improved walking ability    Remaining deficits: None   Education / Equipment: HEP, walking progression  Plan: Patient agrees to discharge.  Patient goals were partially met. Patient is being discharged due to meeting the stated rehab goals.  ?????

## 2019-03-26 ENCOUNTER — Other Ambulatory Visit: Payer: Self-pay | Admitting: Family Medicine

## 2019-03-26 ENCOUNTER — Telehealth: Payer: Self-pay

## 2019-03-26 MED ORDER — FLUTICASONE PROPIONATE 50 MCG/ACT NA SUSP: 1.0000 | Freq: Every day | NASAL | 12 refills | Status: DC

## 2019-03-26 NOTE — Telephone Encounter (Signed)
RX sent  Denise More, DO, PGY-3 [facility] 03/26/2019 5:01 PM

## 2019-03-26 NOTE — Telephone Encounter (Signed)
Pt calls to request a refill on fluticasone nasal spray. This medication is not on her current medication list.   Please advise  Talbot Grumbling, RN

## 2019-04-23 ENCOUNTER — Telehealth: Payer: Self-pay | Admitting: Obstetrics and Gynecology

## 2019-04-23 NOTE — Telephone Encounter (Signed)
Called the patient to complete the covid screening. The patient stated she was unaware of the appointment as the referring office did not make her aware that they were making this appointment. She also stated she has already had a bx done and she is still having the same issues so she does not see a point of containing with the appointment. Advised the patient of the option to complete a mychart appointment to talk to the MD she stated no. She will contact the referring office.

## 2019-04-24 ENCOUNTER — Encounter: Payer: Medicare Other | Admitting: Obstetrics and Gynecology

## 2019-05-03 ENCOUNTER — Other Ambulatory Visit (INDEPENDENT_AMBULATORY_CARE_PROVIDER_SITE_OTHER): Payer: Self-pay | Admitting: Family Medicine

## 2019-05-24 ENCOUNTER — Ambulatory Visit: Payer: Medicare Other | Attending: Internal Medicine

## 2019-05-24 DIAGNOSIS — Z23 Encounter for immunization: Secondary | ICD-10-CM

## 2019-05-24 NOTE — Progress Notes (Signed)
   Covid-19 Vaccination Clinic  Name:  [name]    MRN: [id number] DOB: 12-02-1950  05/24/2019  [name] was observed post Covid-19 immunization for 15 minutes without incidence. She was provided with Vaccine Information Sheet and instruction to access the V-Safe system.   [name] was instructed to call 911 with any severe reactions post vaccine: Marland Kitchen Difficulty breathing  . Swelling of your face and throat  . A fast heartbeat  . A bad rash all over your body  . Dizziness and weakness    Immunizations Administered    Name Date Dose VIS Date Route   Pfizer COVID-19 Vaccine 05/24/2019  5:44 PM 0.3 mL 03/07/2019 Intramuscular   Manufacturer: [employer]   Lot: [id number]   [place]: [id number]

## 2019-05-25 ENCOUNTER — Other Ambulatory Visit: Payer: Self-pay

## 2019-05-25 ENCOUNTER — Encounter (HOSPITAL_COMMUNITY): Payer: Self-pay | Admitting: Emergency Medicine

## 2019-05-25 ENCOUNTER — Emergency Department (HOSPITAL_COMMUNITY)
Admission: EM | Admit: 2019-05-25 | Discharge: 2019-05-26 | Disposition: A | Discharge status: Home / Self Care | Payer: Medicare Other | Attending: Emergency Medicine | Admitting: Emergency Medicine

## 2019-05-25 DIAGNOSIS — N898 Other specified noninflammatory disorders of vagina: Secondary | ICD-10-CM | POA: Insufficient documentation

## 2019-05-25 DIAGNOSIS — R1032 Left lower quadrant pain: Secondary | ICD-10-CM

## 2019-05-25 DIAGNOSIS — Z87891 Personal history of nicotine dependence: Secondary | ICD-10-CM | POA: Insufficient documentation

## 2019-05-25 DIAGNOSIS — Z79899 Other long term (current) drug therapy: Secondary | ICD-10-CM | POA: Insufficient documentation

## 2019-05-25 DIAGNOSIS — Z91040 Latex allergy status: Secondary | ICD-10-CM | POA: Insufficient documentation

## 2019-05-25 DIAGNOSIS — R1030 Lower abdominal pain, unspecified: Secondary | ICD-10-CM | POA: Diagnosis not present

## 2019-05-25 DIAGNOSIS — Z7982 Long term (current) use of aspirin: Secondary | ICD-10-CM | POA: Insufficient documentation

## 2019-05-25 DIAGNOSIS — I10 Essential (primary) hypertension: Secondary | ICD-10-CM | POA: Insufficient documentation

## 2019-05-25 DIAGNOSIS — R10814 Left lower quadrant abdominal tenderness: Secondary | ICD-10-CM | POA: Diagnosis present

## 2019-05-25 DIAGNOSIS — R109 Unspecified abdominal pain: Secondary | ICD-10-CM | POA: Diagnosis not present

## 2019-05-25 DIAGNOSIS — M1612 Unilateral primary osteoarthritis, left hip: Secondary | ICD-10-CM | POA: Diagnosis not present

## 2019-05-25 LAB — COMPREHENSIVE METABOLIC PANEL
ALT: 25 U/L (ref 0–44)
AST: 24 U/L (ref 15–41)
Albumin: 3.7 g/dL (ref 3.5–5.0)
Alkaline Phosphatase: 90 U/L (ref 38–126)
Anion gap: 9 (ref 5–15)
BUN: 18 mg/dL (ref 8–23)
CO2: 24 mmol/L (ref 22–32)
Calcium: 9.1 mg/dL (ref 8.9–10.3)
Chloride: 107 mmol/L (ref 98–111)
Creatinine, Ser: 1.05 mg/dL — ABNORMAL HIGH (ref 0.44–1.00)
GFR calc Af Amer: >60 mL/min (ref >60)
GFR calc non Af Amer: 54 mL/min — ABNORMAL LOW (ref >60)
Glucose, Bld: 103 mg/dL — ABNORMAL HIGH (ref 70–99)
Potassium: 3.7 mmol/L (ref 3.5–5.1)
Sodium: 140 mmol/L (ref 135–145)
Total Bilirubin: 0.4 mg/dL (ref 0.3–1.2)
Total Protein: 7 g/dL (ref 6.5–8.1)

## 2019-05-25 LAB — CBC WITH DIFFERENTIAL/PLATELET
Abs Immature Granulocytes: 0.05 10*3/uL (ref 0.00–0.07)
Basophils Absolute: 0.1 10*3/uL (ref 0.0–0.1)
Basophils Relative: 1 %
Eosinophils Absolute: 0.4 10*3/uL (ref 0.0–0.5)
Eosinophils Relative: 3 %
HCT: 37.7 % (ref 36.0–46.0)
Hemoglobin: 12 g/dL (ref 12.0–15.0)
Immature Granulocytes: 0 %
Lymphocytes Relative: 22 %
Lymphs Abs: 2.8 10*3/uL (ref 0.7–4.0)
MCH: 31.1 pg (ref 26.0–34.0)
MCHC: 31.8 g/dL (ref 30.0–36.0)
MCV: 97.7 fL (ref 80.0–100.0)
Monocytes Absolute: 0.7 10*3/uL (ref 0.1–1.0)
Monocytes Relative: 6 %
Neutro Abs: 8.8 10*3/uL — ABNORMAL HIGH (ref 1.7–7.7)
Neutrophils Relative %: 68 %
Platelets: 286 10*3/uL (ref 150–400)
RBC: 3.86 MIL/uL — ABNORMAL LOW (ref 3.87–5.11)
RDW: 13.7 % (ref 11.5–15.5)
WBC: 12.8 10*3/uL — ABNORMAL HIGH (ref 4.0–10.5)
nRBC: 0 % (ref 0.0–0.2)

## 2019-05-25 LAB — URINALYSIS, ROUTINE W REFLEX MICROSCOPIC
Bilirubin Urine: NEGATIVE
Glucose, UA: NEGATIVE mg/dL
Hgb urine dipstick: NEGATIVE
Ketones, ur: NEGATIVE mg/dL
Leukocytes,Ua: NEGATIVE
Nitrite: NEGATIVE
Protein, ur: NEGATIVE mg/dL
Specific Gravity, Urine: 1.017 (ref 1.005–1.030)
pH: 5 (ref 5.0–8.0)

## 2019-05-25 MED ORDER — MORPHINE SULFATE (PF) 4 MG/ML IV SOLN
4.0000 mg | Freq: Once | INTRAVENOUS | Status: AC
  Administered 2019-05-25: 4 mg via INTRAVENOUS
  Filled 2019-05-25: qty 1

## 2019-05-25 NOTE — ED Provider Notes (Signed)
[facility] EMERGENCY DEPARTMENT Provider Note   CSN: [id number] Arrival date & time: 05/25/19  1934     History Chief Complaint  Patient presents with  . Groin pain / Leg pain    [name] is a 69 y.o. female with a hx of hypertension, osteoarthritis, hyperlipidemia, aortic atherosclerosis, sciatica presents to the Emergency Department complaining of gradual, persistent, progressively worsening LLQ and Left groin pain onset 3 days ago.  Patient reports she has associated pain in her bilateral legs that seem to radiate up from her feet.  She reports the pain is worsened significantly when she walks and improves with rest.  She states movement of her leg and hip does not aggravate the pain.  She is taken Tylenol without relief.  Patient has a history of left-sided sciatica but reports this is very different.  She reports no back pain tonight.  No falls or known trauma.  Patient reports history of bowel obstruction in 2011 requiring surgery and reports this feels similar.  She rates her pain an 8/10 and states it is kept her up for the last 2 nights.  Patient reports bowel movement this morning and passing flatus throughout the day.  No diarrhea, melena or hematochezia.  The history is provided by the patient and a friend. No language interpreter was used.       Past Medical History:  Diagnosis Date  . Abnormal EKG 2012   hospitalized for T wave inversion in lateral leads with MSK chest pains, normal ECHO, negative trops, no cardiology consult. recent EKG 7/15 stable T wave inversions.   . 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 Arthritis 2000  . Arthritis 2011  . Clotting disorder ([place])    blood clot kidney 2017  . Heart murmur   . Hyperlipidemia 2011  . Hypertension 2011  . Meniscus tear 2000   L KNEE  . 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 Contact dermatitis 01/14/2019  . Vaginal  itching 12/05/2018  . Pelvic pain 10/07/2018  . Leg edema 08/28/2018  . Thickened endometrium   . Umbilical hernia without obstruction and without gangrene 01/01/2018  . Osteopenia 09/05/2017  . 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 Renal infarct ([place]) 09/30/2015  . HLD (hyperlipidemia)   . Aortic atherosclerosis ([place])   . Back pain 07/08/2015  . 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 Bilateral leg pain 06/14/2015  . Seasonal allergies 03/09/2015  . Hypopigmentation 03/09/2015  . Osteoarthritis of left knee 07/08/2014  . DJD (degenerative joint disease), lumbar 07/08/2014  . Osteoarthritis of right knee 07/08/2014  . Urinary frequency 06/11/2014  . Healthcare maintenance 06/11/2014  . Allergic rhinoconjunctivitis of both eyes 06/11/2014  . Nearsightedness 04/27/2014  . Abnormal EKG   . Hypertension 12/11/2013    Past Surgical History:  Procedure Laterality Date  . ABDOMINAL SURGERY  2010   for bowel blockage  . COLON SURGERY  2010   bowel blockage  . COLONOSCOPY  09/2014  . HYSTEROSCOPY WITH D & C N/A 05/08/2018   Procedure: DILATATION AND CURETTAGE /HYSTEROSCOPY;  Surgeon: Mora Bellman, MD;  Location: [facility];  Service: Gynecology;  Laterality: N/A;  . KNEE ARTHROSCOPY Left 09/25/2013   Procedure: LEFT KNEE ARTHROSCOPY WITH PARTIAL MEDIAL AND LATERAL  MENISCECTOMY/DEBRIDEMENT/MICRO FRACTURE MEDIAL FEMORAL CONDYLE, REMOVAL OF OSTEOCONDRAL FRAGMENT;  Surgeon: Johnn Hai, MD;  Location: [facility];  Service: Orthopedics;  Laterality: Left;  . KNEE SURGERY Right 1999  . POLYPECTOMY    . TEE WITHOUT CARDIOVERSION N/A 10/04/2015   Procedure: TRANSESOPHAGEAL ECHOCARDIOGRAM (TEE);  Surgeon: Sueanne Margarita, MD;  Location: [facility] ENDOSCOPY;  Service: Cardiovascular;  Laterality: N/A;  . TUBAL LIGATION  1978     OB History    Gravida  2   Para  2   Term      Preterm      AB      Living  2     SAB      TAB      Ectopic      Multiple       Live Births              Family History  Problem Relation Age of Onset  . Hypertension Mother   . Heart disease Mother        has a pacemaker   . Alzheimer's disease Mother   . Hyperlipidemia Mother   . Osteoporosis Mother   . Heart failure Father   . Hypertension Father   . Alzheimer's disease Father   . Hypertension Sister   . Hypertension Sister   . Gout Sister   . Alcohol abuse Son   . Pancreatitis Son   . Cancer Neg Hx   . Colon cancer Neg Hx   . Esophageal cancer Neg Hx   . Stomach cancer Neg Hx   . Rectal cancer Neg Hx     Social History   Tobacco Use  . Smoking status: Former Smoker    Quit date: 07/29/2010    Years since quitting: 8.8  . Smokeless tobacco: Never Used  Substance Use Topics  . Alcohol use: Never    Alcohol/week: 0.0 standard drinks  . Drug use: Never    Home Medications Prior to Admission medications   Medication Sig Start Date End Date Taking? Authorizing Provider  aspirin EC 81 MG tablet Take 1 tablet (81 mg total) daily by mouth. 01/31/17   Clent Demark, PA-C  atorvastatin (LIPITOR) 40 MG tablet Take 1 tablet (40 mg total) by mouth daily at 6 PM. 02/24/19   Caroline More, DO  carvedilol (COREG) 12.5 MG tablet Take 1 tablet (12.5 mg total) by mouth 2 (two) times daily with a meal. 05/05/19   Tammi Klippel, Sherin, DO  fluticasone (FLONASE) 50 MCG/ACT nasal spray Place 1 spray into both nostrils daily. 1 spray in each nostril every day 03/26/19   Caroline More, DO  hydrochlorothiazide (HYDRODIURIL) 25 MG tablet Take 1 tablet (25 mg total) by mouth daily. 12/17/18   Caroline More, DO  ibuprofen (ADVIL) 600 MG tablet Take 1 tablet (600 mg total) by mouth every 6 (six) hours as needed. 08/27/18   Caroline More, DO  ibuprofen (ADVIL) 600 MG tablet Take 1 tablet (600 mg total) by mouth every 8 (eight) hours as needed. 10/04/18   Caroline More, DO  losartan (COZAAR) 25 MG tablet Take 1 tablet (25 mg total) by mouth at bedtime. 12/17/18    Caroline More, DO  Multiple Vitamin (MULITIVITAMIN WITH MINERALS) TABS Take 1 tablet by mouth daily.    [provider]  naproxen (NAPROSYN) 500 MG tablet Take 1 tablet (500 mg total) by mouth 2 (two) times daily with a meal. 05/26/19   Andri Prestia, Jarrett Soho, PA-C  potassium chloride SA (K-DUR) 20 MEQ tablet Take 1 tablet (20 mEq total) by mouth daily. 12/17/18   Caroline More, DO  triamcinolone ointment (KENALOG) 0.1 % Apply 1 application topically 2 (two) times daily. 01/14/19   Caroline More, DO    Allergies    Shrimp [shellfish allergy], Latex, and Penicillins  Review of Systems   Review of Systems  Constitutional: Negative for appetite change, diaphoresis, fatigue, fever and unexpected weight change.  HENT: Negative for mouth sores.   Eyes: Negative for visual disturbance.  Respiratory: Negative for cough, chest tightness, shortness of breath and wheezing.   Cardiovascular: Negative for chest pain.  Gastrointestinal: Positive for abdominal pain. Negative for constipation, diarrhea, nausea and vomiting.  Endocrine: Negative for polydipsia, polyphagia and polyuria.  Genitourinary: Negative for dysuria, frequency, hematuria and urgency.  Musculoskeletal: Positive for arthralgias. Negative for back pain and neck stiffness.  Skin: Negative for rash.  Allergic/Immunologic: Negative for immunocompromised state.  Neurological: Negative for syncope, light-headedness and headaches.  Hematological: Does not bruise/bleed easily.  Psychiatric/Behavioral: Negative for sleep disturbance. The patient is not nervous/anxious.     Physical Exam Updated Vital Signs BP (!) 144/89 (BP Location: Right Arm)   Pulse (!) 55   Temp 98 F (36.7 C) (Oral)   Resp 18   Ht 5\' 5"  (1.651 m)   Wt 80 kg   SpO2 100%   BMI 29.35 kg/m   Physical Exam Vitals and nursing note reviewed.  Constitutional:      General: She is not in acute distress.    Appearance: She is not diaphoretic.  HENT:      Head: Normocephalic.  Eyes:     General: No scleral icterus.    Conjunctiva/sclera: Conjunctivae normal.  Cardiovascular:     Rate and Rhythm: Normal rate and regular rhythm.     Pulses: Normal pulses.          Radial pulses are 2+ on the right side and 2+ on the left side.  Pulmonary:     Effort: No tachypnea, accessory muscle usage, prolonged expiration, respiratory distress or retractions.     Breath sounds: No stridor.     Comments: Equal chest rise. No increased work of breathing. Abdominal:     General: There is no distension.     Palpations: Abdomen is soft.     Tenderness: There is abdominal tenderness in the left lower quadrant. There is no right CVA tenderness, left CVA tenderness, guarding or rebound.     Hernia: There is no hernia in the left inguinal area, right femoral area, left femoral area or right inguinal area.     Comments: Left lower quadrant abdominal pain without rebound or guarding.  No CVA tenderness bilaterally.  Musculoskeletal:     Cervical back: Normal and normal range of motion.     Thoracic back: Normal.     Lumbar back: Normal.     Right hip: Normal. No tenderness or bony tenderness.     Left hip: Normal. No tenderness or bony tenderness.     Comments: Moves all extremities equally and without difficulty. No midline or paraspinal tenderness to the C-spine, T-spine or L-spine. Full range of motion of the bilateral hips, knees and ankles without pain.  Lymphadenopathy:     Lower Body: No right inguinal adenopathy. No left inguinal adenopathy.  Skin:    General: Skin is warm and dry.     Capillary Refill: Capillary refill takes less than 2 seconds.     Comments: No rash, erythema, increased warmth or areas of induration.  No open wounds.  No petechiae or purpura.  Neurological:  Mental Status: She is alert.     GCS: GCS eye subscore is 4. GCS verbal subscore is 5. GCS motor subscore is 6.     Comments: Speech is clear and goal oriented.    Psychiatric:        Mood and Affect: Mood normal.     ED Results / Procedures / Treatments   Labs (all labs ordered are listed, but only abnormal results are displayed) Labs Reviewed  CBC WITH DIFFERENTIAL/PLATELET - Abnormal; Notable for the following components:      Result Value   WBC 12.8 (*)    RBC 3.86 (*)    Neutro Abs 8.8 (*)    All other components within normal limits  COMPREHENSIVE METABOLIC PANEL - Abnormal; Notable for the following components:   Glucose, Bld 103 (*)    Creatinine, Ser 1.05 (*)    GFR calc non Af Amer 54 (*)    All other components within normal limits  URINALYSIS, ROUTINE W REFLEX MICROSCOPIC    EKG EKG Interpretation  Date/Time:  Sunday May 25 2019 23:54:54 EST Ventricular Rate:  57 PR Interval:    QRS Duration: 98 QT Interval:  417 QTC Calculation: 406 R Axis:   57 Text Interpretation: Sinus rhythm Repol abnrm suggests ischemia, anterolateral very similar ecg to may 2020 Confirmed by Merrily Pew [phone]) on 05/25/2019 11:58:32 PM   Radiology CT ABDOMEN PELVIS W CONTRAST  Result Date: 05/26/2019 CLINICAL DATA:  Left lower quadrant abdominal pain and groin pain. EXAM: CT ABDOMEN AND PELVIS WITH CONTRAST TECHNIQUE: Multidetector CT imaging of the abdomen and pelvis was performed using the standard protocol following bolus administration of intravenous contrast. CONTRAST:  166mL OMNIPAQUE IOHEXOL 300 MG/ML  SOLN COMPARISON:  10/07/2018 FINDINGS: Lower chest: Negative Hepatobiliary: Normal Pancreas: Normal Spleen: Normal Adrenals/Urinary Tract: Adrenal glands are normal. Kidneys are normal. Bladder is normal. Stomach/Bowel: No acute bowel finding. Stomach and small intestine are normal. Normal appendix. No evidence of diverticulosis or diverticulitis. Small bilateral inguinal hernias containing only fat, left larger than right. There is been previous midline laparotomy. There is probably a small periumbilical ventral hernia containing only fat.  Vascular/Lymphatic: Aortic atherosclerosis. No aneurysm. IVC is normal. No retroperitoneal adenopathy. Reproductive: Calcified leiomyomas of the uterus.  No pelvic mass. Other: No free fluid or air. Musculoskeletal: See results of lumbar spine CT. No significant degenerative change of the hip joints or sacroiliac joints. IMPRESSION: No acute abdominal finding.  No evidence of bowel pathology. Small inguinal hernias containing only fat, left larger than right. Previous midline laparotomy. Small periumbilical ventral hernia containing only fat. Aortic atherosclerosis. Electronically Signed   By: Nelson Chimes M.D.   On: 05/26/2019 01:04   CT L-SPINE NO CHARGE  Result Date: 05/26/2019 CLINICAL DATA:  Left groin pain over the last 3 days. EXAM: CT LUMBAR SPINE WITHOUT CONTRAST TECHNIQUE: Multidetector CT imaging of the lumbar spine was performed without intravenous contrast administration. Multiplanar CT image reconstructions were also generated. COMPARISON:  Radiography 01/14/2019 FINDINGS: Segmentation: 5 lumbar type vertebral bodies. Alignment: Curvature convex to the right with the apex at L2. 3 mm degenerative anterolisthesis L5-S1. Vertebrae: No fracture or primary bone lesion. Chronic degenerative endplate changes. Paraspinal and other soft tissues: See results of abdominal CT. Disc levels: No significant finding in the lower thoracic region. T12-L1 disc shows degeneration with vacuum phenomenon but no bulge or herniation. No stenosis. L1-2: Disc degeneration with vacuum phenomenon worse on the left. No compressive canal or foraminal stenosis. L2-3: Disc degeneration with  vacuum phenomenon worse on the left. Mild left foraminal stenosis. L3-4: Disc degeneration with vacuum phenomenon worse on the left. Facet and ligamentous hypertrophy. Mild canal stenosis. Bilateral foraminal stenosis. L4-5: Disc degeneration with vacuum phenomenon worse on the right. Bulging of the disc. Facet osteoarthritis. Right foraminal  narrowing that could possibly cause neural compression. L5-S1: Chronic facet arthropathy with anterolisthesis of 3 mm. Disc degeneration with vacuum phenomenon. No central canal stenosis. Foraminal stenosis on the right that could cause neural compression. IMPRESSION: Thoracolumbar spinal curvature. Advanced lumbar degenerative changes throughout the region as outlined at each level. No definite compressive central canal stenosis. Foraminal narrowing that could possibly cause neural compression bilaterally at L3-4, on the right at L4-5 and on the right at L5-S1. The lumbar degenerative disc disease and degenerative facet disease could certainly be a cause of low back pain or referred facet syndrome pain. Electronically Signed   By: Nelson Chimes M.D.   On: 05/26/2019 00:57   DG Hip Unilat W or Wo Pelvis 2-3 Views Left  Result Date: 05/26/2019 CLINICAL DATA:  Left groin pain EXAM: DG HIP (WITH OR WITHOUT PELVIS) 2-3V LEFT COMPARISON:  January 14, 2019 FINDINGS: There is no evidence of hip fracture or dislocation. Mild bilateral hip osteoarthritis is seen with superior joint space loss. There is degenerative changes seen in the lower lumbar spine. Probable calcified fibroid seen within the deep right pelvis. IMPRESSION: No acute osseous abnormality.  Mild bilateral hip osteoarthritis. Electronically Signed   By: Prudencio Pair M.D.   On: 05/26/2019 00:18    Procedures Procedures (including critical care time)  Medications Ordered in ED Medications  morphine 4 MG/ML injection 4 mg (4 mg Intravenous Given 05/25/19 2356)  iohexol (OMNIPAQUE) 300 MG/ML solution 100 mL (100 mLs Intravenous Contrast Given 05/26/19 0019)  fluconazole (DIFLUCAN) tablet 150 mg (150 mg Oral Given 05/26/19 0158)    ED Course  I have reviewed the triage vital signs and the nursing notes.  Pertinent labs & imaging results that were available during my care of the patient were reviewed by me and considered in my medical decision making  (see chart for details).    MDM Rules/Calculators/A&P                      Patient presents with complaints of left groin pain however on exam it appears she has left lower quadrant abdominal pain.  Worse with walking.  Labs show mild leukocytosis.  She is without fever, tachycardia or hypotension to suggest sepsis.  Patient does report history of small bowel obstruction with similar symptoms.  Will obtain CT scan.  Plain films of her hip pending however she denies known trauma or falls.  No palpable hernia or lymphadenopathy on exam.  No evidence of cellulitis.  Patient ambulatory with steady, but antalgic gait in the ED.    1:54 AM  Work-up reassuring.  Patient with mild leukocytosis but afebrile without tachycardia or hypotension to just sepsis.  Creatinine slightly elevated at 1.05, but no urinary tract infection.  CT scan without evidence of diverticulitis or diverticulosis.  No acute intra-abdominal pathology noted.  Images of the L-spine show degenerative disc disease but no acute fractures.  Plain films of the hip with osteoarthritis but no acute fracture.  Patient ambulatory without difficulty here in the emergency department.  Pt followed by Dr. Ricki Rodriguez for her arthritis.  Will refer back to him for further evaluation and management.  In the meantime we will give anti-inflammatories.  Long discussion with patient about the importance of taking this with food, never taking empty stomach and stopping if she has pain or notices dark stools.  She is not anticoagulated and has never had a GI bleed.  She has previously taken ibuprofen without difficulty.  Discussed reasons to return to the emergency department.  Patient states understanding and is in agreement the plan.  Patient also now stating that she has vaginal itching.  There was no evidence of candidal rash to the groin on initial exam however vulva was not evaluated.  Patient reports history of yeast infections with similar symptoms.  She has  used Monistat with some relief but not complete relief.  Will give Diflucan here in the emergency department.  Final Clinical Impression(s) / ED Diagnoses Final diagnoses:  Left groin pain  Vaginal itching  LLQ abdominal pain    Rx / DC Orders ED Discharge Orders         Ordered    naproxen (NAPROSYN) 500 MG tablet  2 times daily with meals     05/26/19 0159           Betrice Wanat, Jarrett Soho, PA-C 05/26/19 0201    Mesner, Corene Cornea, MD 05/26/19 [phone]

## 2019-05-25 NOTE — ED Triage Notes (Signed)
Patient reports left groin pain for 3 days , denies injury , no dysuria or fever . Patient added pain bilateral elbows and bilateral legs .

## 2019-05-26 ENCOUNTER — Emergency Department (HOSPITAL_COMMUNITY): Payer: Medicare Other

## 2019-05-26 ENCOUNTER — Other Ambulatory Visit: Payer: Self-pay | Admitting: *Deleted

## 2019-05-26 ENCOUNTER — Other Ambulatory Visit: Payer: Self-pay | Admitting: Family Medicine

## 2019-05-26 DIAGNOSIS — R109 Unspecified abdominal pain: Secondary | ICD-10-CM | POA: Diagnosis not present

## 2019-05-26 DIAGNOSIS — M1612 Unilateral primary osteoarthritis, left hip: Secondary | ICD-10-CM | POA: Diagnosis not present

## 2019-05-26 DIAGNOSIS — R1030 Lower abdominal pain, unspecified: Secondary | ICD-10-CM | POA: Diagnosis not present

## 2019-05-26 MED ORDER — NAPROXEN 500 MG PO TABS: 500.0000 mg | ORAL_TABLET | Freq: Two times a day (BID) | ORAL | 0 refills | Status: DC

## 2019-05-26 MED ORDER — FLUCONAZOLE 150 MG PO TABS
150.0000 mg | ORAL_TABLET | Freq: Once | ORAL | Status: AC
  Administered 2019-05-26: 150 mg via ORAL
  Filled 2019-05-26: qty 1

## 2019-05-26 MED ORDER — IOHEXOL 300 MG/ML  SOLN
100.0000 mL | Freq: Once | INTRAMUSCULAR | Status: AC | PRN
  Administered 2019-05-26: 100 mL via INTRAVENOUS

## 2019-05-26 NOTE — Telephone Encounter (Signed)
RX for diflucan sent. If no improvement, must follow up for in person visit  Caroline More, DO, PGY-3 [facility] 05/26/2019 2:33 PM

## 2019-05-26 NOTE — Discharge Instructions (Addendum)
1. Medications: Naproxen 2 times per day always with food, usual home medications 2. Treatment: rest, drink plenty of fluids, dental stretching 3. Follow Up: Please followup with Dr. Maureen Ralphs in 3-5 days.  Please also follow-up with your primary care provider about your vaginal itching and any other concerns. Please return to the ER for fever, chills, inability to walk, worsening pain, worsening abdominal pain or other concerns

## 2019-05-26 NOTE — Telephone Encounter (Signed)
Pt calls requesting a refill on her "vaginal itch cream".  Pt report is scattered, she mentions that OB/GYN did not do anything about the itching, the ED did not do anything....  When I mentioned scheduling an appt, she then ask "for what, I was just there".  Attempted to explain the why she needed an appt but she is convince this is not needed because she was "just seen" (last appt I see is October).  She request that I ask the provider for a refill anyway.  To PCP. Christen Bame, CMA

## 2019-05-26 NOTE — ED Provider Notes (Signed)
Medical screening examination/treatment/procedure(s) were conducted as a shared visit with non-physician practitioner(s) and myself.  I personally evaluated the patient during the encounter.  Groin and abdominal pain.  She mostly endorses groin pain for me and it seems to be where her tenderness is right over the distal inguinal canal however with Jarrett Soho she endorsed more left lower quadrant abdominal pain.  No symptoms of obstruction.  No symptoms of infection.  She does relate what sounds like probably a yeast infection which seemed to get a little better with Monistat 7 but has not completely resolved. Plans for CT scan labs disposition pending.  EKG Interpretation  Date/Time:  Sunday May 25 2019 23:54:54 EST Ventricular Rate:  57 PR Interval:    QRS Duration: 98 QT Interval:  417 QTC Calculation: 406 R Axis:   57 Text Interpretation: Sinus rhythm Repol abnrm suggests ischemia, anterolateral very similar ecg to may 2020 Confirmed by Merrily Pew [phone]) on 05/25/2019 11:58:32 PM     Shironda Kain, Corene Cornea, MD 05/26/19 [phone]

## 2019-05-26 NOTE — ED Notes (Signed)
Patient verbalizes understanding of discharge instructions. Opportunity for questioning and answers were provided. Armband removed by staff, pt discharged from ED. Pt. ambulatory and discharged home.  

## 2019-05-27 NOTE — Telephone Encounter (Signed)
Informed patient of RX at pharmacy and the need for follow up if RX does not work.  Patient verbalized understanding.  [name], [place]

## 2019-06-26 ENCOUNTER — Other Ambulatory Visit: Payer: Self-pay | Admitting: Family Medicine

## 2019-07-18 DIAGNOSIS — Z1159 Encounter for screening for other viral diseases: Secondary | ICD-10-CM | POA: Diagnosis not present

## 2019-07-18 DIAGNOSIS — R202 Paresthesia of skin: Secondary | ICD-10-CM | POA: Diagnosis not present

## 2019-07-18 DIAGNOSIS — I700 Atherosclerosis of aorta: Secondary | ICD-10-CM | POA: Diagnosis not present

## 2019-07-18 DIAGNOSIS — I10 Essential (primary) hypertension: Secondary | ICD-10-CM | POA: Diagnosis not present

## 2019-07-18 DIAGNOSIS — M899 Disorder of bone, unspecified: Secondary | ICD-10-CM | POA: Diagnosis not present

## 2019-07-18 DIAGNOSIS — E785 Hyperlipidemia, unspecified: Secondary | ICD-10-CM | POA: Diagnosis not present

## 2019-07-18 DIAGNOSIS — Z79899 Other long term (current) drug therapy: Secondary | ICD-10-CM | POA: Diagnosis not present

## 2019-07-18 DIAGNOSIS — I330 Acute and subacute infective endocarditis: Secondary | ICD-10-CM | POA: Diagnosis not present

## 2019-07-18 DIAGNOSIS — J309 Allergic rhinitis, unspecified: Secondary | ICD-10-CM | POA: Diagnosis not present

## 2019-08-06 ENCOUNTER — Other Ambulatory Visit (HOSPITAL_COMMUNITY): Payer: Self-pay | Admitting: Family

## 2019-08-06 DIAGNOSIS — I330 Acute and subacute infective endocarditis: Secondary | ICD-10-CM

## 2019-08-07 ENCOUNTER — Other Ambulatory Visit (INDEPENDENT_AMBULATORY_CARE_PROVIDER_SITE_OTHER): Payer: Self-pay | Admitting: Family Medicine

## 2019-08-26 ENCOUNTER — Ambulatory Visit (HOSPITAL_COMMUNITY): Payer: Medicare Other | Attending: Cardiology

## 2019-08-26 ENCOUNTER — Other Ambulatory Visit: Payer: Self-pay

## 2019-08-26 DIAGNOSIS — I330 Acute and subacute infective endocarditis: Secondary | ICD-10-CM

## 2019-09-01 ENCOUNTER — Telehealth: Payer: Self-pay | Admitting: General Practice

## 2019-09-01 NOTE — Telephone Encounter (Signed)
Patient returning call.

## 2019-09-01 NOTE — Telephone Encounter (Signed)
[name] is calling requesting the results to her Echo performed on 08/26/19 due to the requesting provider advising her they have not received the results. Please advise.

## 2019-09-01 NOTE — Telephone Encounter (Signed)
Left message for patient to call back  

## 2019-09-01 NOTE — Telephone Encounter (Signed)
Left message for patient hat echo results were faxed via Epic function to PCP and ordering provider. Instructed her to call back with questions or concerns.

## 2019-09-19 ENCOUNTER — Other Ambulatory Visit (INDEPENDENT_AMBULATORY_CARE_PROVIDER_SITE_OTHER): Payer: Self-pay | Admitting: Family Medicine

## 2019-09-19 DIAGNOSIS — I10 Essential (primary) hypertension: Secondary | ICD-10-CM

## 2019-10-04 ENCOUNTER — Other Ambulatory Visit: Payer: Self-pay

## 2019-10-04 ENCOUNTER — Emergency Department (HOSPITAL_COMMUNITY)
Admission: EM | Admit: 2019-10-04 | Discharge: 2019-10-05 | Disposition: A | Discharge status: Home / Self Care | Payer: Medicare Other | Attending: Emergency Medicine | Admitting: Emergency Medicine

## 2019-10-04 ENCOUNTER — Emergency Department (HOSPITAL_COMMUNITY): Payer: Medicare Other

## 2019-10-04 ENCOUNTER — Encounter (HOSPITAL_COMMUNITY): Payer: Self-pay | Admitting: Emergency Medicine

## 2019-10-04 DIAGNOSIS — Z7982 Long term (current) use of aspirin: Secondary | ICD-10-CM | POA: Insufficient documentation

## 2019-10-04 DIAGNOSIS — I10 Essential (primary) hypertension: Secondary | ICD-10-CM | POA: Insufficient documentation

## 2019-10-04 DIAGNOSIS — Z79899 Other long term (current) drug therapy: Secondary | ICD-10-CM | POA: Diagnosis not present

## 2019-10-04 DIAGNOSIS — Z91040 Latex allergy status: Secondary | ICD-10-CM | POA: Diagnosis not present

## 2019-10-04 DIAGNOSIS — R0789 Other chest pain: Secondary | ICD-10-CM | POA: Insufficient documentation

## 2019-10-04 DIAGNOSIS — Z87891 Personal history of nicotine dependence: Secondary | ICD-10-CM | POA: Insufficient documentation

## 2019-10-04 DIAGNOSIS — M25559 Pain in unspecified hip: Secondary | ICD-10-CM

## 2019-10-04 DIAGNOSIS — R109 Unspecified abdominal pain: Secondary | ICD-10-CM | POA: Diagnosis not present

## 2019-10-04 DIAGNOSIS — N898 Other specified noninflammatory disorders of vagina: Secondary | ICD-10-CM

## 2019-10-04 LAB — URINALYSIS, ROUTINE W REFLEX MICROSCOPIC
Bilirubin Urine: NEGATIVE
Glucose, UA: NEGATIVE mg/dL
Ketones, ur: NEGATIVE mg/dL
Leukocytes,Ua: NEGATIVE
Nitrite: NEGATIVE
Protein, ur: NEGATIVE mg/dL
Specific Gravity, Urine: 1.01 (ref 1.005–1.030)
pH: 5 (ref 5.0–8.0)

## 2019-10-04 LAB — CBC
HCT: 37.8 % (ref 36.0–46.0)
Hemoglobin: 11.9 g/dL — ABNORMAL LOW (ref 12.0–15.0)
MCH: 31.1 pg (ref 26.0–34.0)
MCHC: 31.5 g/dL (ref 30.0–36.0)
MCV: 98.7 fL (ref 80.0–100.0)
Platelets: 278 10*3/uL (ref 150–400)
RBC: 3.83 MIL/uL — ABNORMAL LOW (ref 3.87–5.11)
RDW: 13.6 % (ref 11.5–15.5)
WBC: 12.6 10*3/uL — ABNORMAL HIGH (ref 4.0–10.5)
nRBC: 0 % (ref 0.0–0.2)

## 2019-10-04 MED ORDER — SODIUM CHLORIDE 0.9% FLUSH: 3.0000 mL | Freq: Once | INTRAVENOUS | Status: DC

## 2019-10-04 NOTE — ED Triage Notes (Signed)
Pt from home, has multiple complaints including chest pain, flank pain (left sided) and hands and feet sometimes go numb.

## 2019-10-05 ENCOUNTER — Emergency Department (HOSPITAL_COMMUNITY): Payer: Medicare Other

## 2019-10-05 ENCOUNTER — Other Ambulatory Visit: Payer: Self-pay

## 2019-10-05 LAB — BASIC METABOLIC PANEL
Anion gap: 9 (ref 5–15)
BUN: 11 mg/dL (ref 8–23)
CO2: 25 mmol/L (ref 22–32)
Calcium: 9.2 mg/dL (ref 8.9–10.3)
Chloride: 107 mmol/L (ref 98–111)
Creatinine, Ser: 1.06 mg/dL — ABNORMAL HIGH (ref 0.44–1.00)
GFR calc Af Amer: >60 mL/min (ref >60)
GFR calc non Af Amer: 53 mL/min — ABNORMAL LOW (ref >60)
Glucose, Bld: 102 mg/dL — ABNORMAL HIGH (ref 70–99)
Potassium: 3.5 mmol/L (ref 3.5–5.1)
Sodium: 141 mmol/L (ref 135–145)

## 2019-10-05 LAB — TROPONIN I (HIGH SENSITIVITY)
Troponin I (High Sensitivity): 11 ng/L (ref <18)
Troponin I (High Sensitivity): 11 ng/L (ref <18)

## 2019-10-05 MED ORDER — FLUCONAZOLE 150 MG PO TABS
150.0000 mg | ORAL_TABLET | Freq: Once | ORAL | Status: AC
  Administered 2019-10-05: 150 mg via ORAL
  Filled 2019-10-05: qty 1

## 2019-10-05 MED ORDER — LIDOCAINE 5 % EX PTCH
1.0000 | MEDICATED_PATCH | CUTANEOUS | Status: DC
  Administered 2019-10-05: 1 via TRANSDERMAL
  Filled 2019-10-05: qty 1

## 2019-10-05 MED ORDER — ACETAMINOPHEN 500 MG PO TABS
1000.0000 mg | ORAL_TABLET | Freq: Once | ORAL | Status: AC
  Administered 2019-10-05: 1000 mg via ORAL
  Filled 2019-10-05: qty 2

## 2019-10-05 NOTE — Discharge Instructions (Signed)
Please consider topical pain relieving creams such as Voltaran Gel, BioFreeze, or Icy Hot. There is also a pain relieving cream made by [employer]. You should be able to find all of these at your local pharmacy.   You can continue to take Tylenol for management of your pain.  Please follow the instructions on the bottle.  You can always return to the emergency department with new or worsening symptoms.  Please follow-up with your primary care provider tomorrow to discuss his visit as well as her symptoms.  It was a pleasure to meet you.

## 2019-10-05 NOTE — ED Provider Notes (Signed)
[facility] EMERGENCY DEPARTMENT Provider Note   CSN: [id number] Arrival date & time: 10/04/19  2152     History Chief Complaint  Patient presents with  . Chest Pain  . Flank Pain    [name] is a 69 y.o. female.  HPI Patient is a 69 year old female with medical history as noted below.  She presents today with multiple complaints.  Patient reports intermittent right-sided chest pain.  This been occurring about twice a day for the last 3 days.  She describes it as aching and 6/10 at its worst.  No modifying factors.  No shortness of breath, syncope, lightheadedness.  Patient also complains of chronic left low back pain.  Pain worsens with ambulation.   Patient also complains of mild suprapubic pain for the last 3 days.  No dysuria or hematuria.  She notes increased urinary frequency.  Patient also complains of paresthesias in the hands and feet that occur when waking.  She states that after she begins to move this sensation will go away.  She denies weakness or numbness.  Patient is also complaining of vaginal itching for 3 days.  No discharge.  No dysuria.    Past Medical History:  Diagnosis Date  . Abnormal EKG 2012   hospitalized for T wave inversion in lateral leads with MSK chest pains, normal ECHO, negative trops, no cardiology consult. recent EKG 7/15 stable T wave inversions.   . 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 Arthritis 2000  . Arthritis 2011  . Clotting disorder ([place])    blood clot kidney 2017  . Heart murmur   . Hyperlipidemia 2011  . Hypertension 2011  . Meniscus tear 2000   L KNEE  . 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 Contact dermatitis 01/14/2019  . Vaginal itching 12/05/2018  . Pelvic pain 10/07/2018  . Leg edema 08/28/2018  . Thickened endometrium   . Umbilical hernia without obstruction and without gangrene 01/01/2018  . Osteopenia  09/05/2017  . 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 Renal infarct ([place]) 09/30/2015  . HLD (hyperlipidemia)   . Aortic atherosclerosis ([place])   . Back pain 07/08/2015  . 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 Bilateral leg pain 06/14/2015  . Seasonal allergies 03/09/2015  . Hypopigmentation 03/09/2015  . Osteoarthritis of left knee 07/08/2014  . DJD (degenerative joint disease), lumbar 07/08/2014  . Osteoarthritis of right knee 07/08/2014  . Urinary frequency 06/11/2014  . Healthcare maintenance 06/11/2014  . Allergic rhinoconjunctivitis of both eyes 06/11/2014  . Nearsightedness 04/27/2014  . Abnormal EKG   . Hypertension 12/11/2013    Past Surgical History:  Procedure Laterality Date  . ABDOMINAL SURGERY  2010   for bowel blockage  . COLON SURGERY  2010   bowel blockage  . COLONOSCOPY  09/2014  . HYSTEROSCOPY WITH D & C N/A 05/08/2018   Procedure: DILATATION AND CURETTAGE /HYSTEROSCOPY;  Surgeon: Mora Bellman, MD;  Location: [place];  Service: Gynecology;  Laterality: N/A;  . KNEE ARTHROSCOPY Left 09/25/2013   Procedure: LEFT KNEE ARTHROSCOPY WITH PARTIAL MEDIAL AND LATERAL  MENISCECTOMY/DEBRIDEMENT/MICRO FRACTURE MEDIAL FEMORAL CONDYLE, REMOVAL OF OSTEOCONDRAL FRAGMENT;  Surgeon: Johnn Hai, MD;  Location: [facility];  Service: Orthopedics;  Laterality: Left;  . KNEE SURGERY Right 1999  . POLYPECTOMY    . TEE WITHOUT CARDIOVERSION N/A 10/04/2015   Procedure: TRANSESOPHAGEAL  ECHOCARDIOGRAM (TEE);  Surgeon: Sueanne Margarita, MD;  Location[facility] ENDOSCOPY;  Service: Cardiovascular;  Laterality: N/A;  . TUBAL LIGATION  1978     OB History    Gravida  2   Para  2   Term      Preterm      AB      Living  2     SAB      TAB      Ectopic      Multiple      Live Births              Family History  Problem Relation Age of Onset  . Hypertension Mother   . Heart disease Mother        has a pacemaker   . Alzheimer's disease  Mother   . Hyperlipidemia Mother   . Osteoporosis Mother   . Heart failure Father   . Hypertension Father   . Alzheimer's disease Father   . Hypertension Sister   . Hypertension Sister   . Gout Sister   . Alcohol abuse Son   . Pancreatitis Son   . Cancer Neg Hx   . Colon cancer Neg Hx   . Esophageal cancer Neg Hx   . Stomach cancer Neg Hx   . Rectal cancer Neg Hx     Social History   Tobacco Use  . Smoking status: Former Smoker    Quit date: 07/29/2010    Years since quitting: 9.1  . Smokeless tobacco: Never Used  Vaping Use  . Vaping Use: Never used  Substance Use Topics  . Alcohol use: Never    Alcohol/week: 0.0 standard drinks  . Drug use: Never    Home Medications Prior to Admission medications   Medication Sig Start Date End Date Taking? Authorizing Provider  aspirin EC 81 MG tablet Take 1 tablet (81 mg total) daily by mouth. 01/31/17   Clent Demark, PA-C  atorvastatin (LIPITOR) 40 MG tablet Take 1 tablet (40 mg total) by mouth daily at 6 PM. 02/24/19   Caroline More, DO  carvedilol (COREG) 12.5 MG tablet TAKE 1 TABLET(12.5 MG) BY MOUTH TWICE DAILY WITH A MEAL 08/08/19   Rumball, Bryson Ha, DO  fluconazole (DIFLUCAN) 150 MG tablet TAKE ONE TABLET BY MOUTH NOW, THEN THE SECOND TABLET IN 72 HOURS 05/26/19   Tammi Klippel, Sherin, DO  fluticasone (FLONASE) 50 MCG/ACT nasal spray Place 1 spray into both nostrils daily. 1 spray in each nostril every day 03/26/19   Caroline More, DO  hydrochlorothiazide (HYDRODIURIL) 25 MG tablet TAKE 1 TABLET BY MOUTH DAILY 09/21/19   Caroline More, DO  ibuprofen (ADVIL) 600 MG tablet Take 1 tablet (600 mg total) by mouth every 6 (six) hours as needed. 08/27/18   Caroline More, DO  ibuprofen (ADVIL) 600 MG tablet Take 1 tablet (600 mg total) by mouth every 8 (eight) hours as needed. 10/04/18   Caroline More, DO  losartan (COZAAR) 25 MG tablet TAKE 1 TABLET BY MOUTH EVERY NIGHT AT BEDTIME 06/26/19   Caroline More, DO  Multiple Vitamin  (MULITIVITAMIN WITH MINERALS) TABS Take 1 tablet by mouth daily.    [provider]  naproxen (NAPROSYN) 500 MG tablet Take 1 tablet (500 mg total) by mouth 2 (two) times daily with a meal. 05/26/19   Muthersbaugh, Jarrett Soho, PA-C  potassium chloride SA (K-DUR) 20 MEQ tablet Take 1 tablet (20 mEq total) by mouth daily. 12/17/18   Caroline More, DO  triamcinolone ointment (KENALOG)  0.1 % Apply 1 application topically 2 (two) times daily. 01/14/19   Caroline More, DO    Allergies    Shrimp [shellfish allergy], Latex, and Penicillins  Review of Systems   Review of Systems  All other systems reviewed and are negative. Ten systems reviewed and are negative for acute change, except as noted in the HPI.    Physical Exam Updated Vital Signs BP (!) 158/65 (BP Location: Left Arm)   Pulse (!) 58   Temp 97.7 F (36.5 C)   Resp 13   Ht 5\' 5"  (1.651 m)   Wt 81.6 kg   SpO2 100%   BMI 29.95 kg/m   Physical Exam Vitals and nursing note reviewed.  Constitutional:      General: She is not in acute distress.    Appearance: Normal appearance. She is not ill-appearing, toxic-appearing or diaphoretic.  HENT:     Head: Normocephalic and atraumatic.     Right Ear: External ear normal.     Left Ear: External ear normal.     Nose: Nose normal.     Mouth/Throat:     Mouth: Mucous membranes are moist.     Pharynx: Oropharynx is clear. No oropharyngeal exudate or posterior oropharyngeal erythema.  Eyes:     Extraocular Movements: Extraocular movements intact.     Pupils: Pupils are equal, round, and reactive to light.  Cardiovascular:     Rate and Rhythm: Normal rate and regular rhythm.     Pulses: Normal pulses.          Radial pulses are 2+ on the right side and 2+ on the left side.       Dorsalis pedis pulses are 2+ on the right side and 2+ on the left side.     Heart sounds: Heart sounds not distant. Murmur heard.  Systolic murmur is present.  No diastolic murmur is present.  No  friction rub. No gallop.   Pulmonary:     Effort: Pulmonary effort is normal. No tachypnea, accessory muscle usage or respiratory distress.     Breath sounds: Normal breath sounds. No stridor. No decreased breath sounds, wheezing, rhonchi or rales.  Chest:     Chest wall: Tenderness (Mild TTP noted along the right anterior chest wall) present. No deformity or crepitus.  Abdominal:     General: Abdomen is flat.     Palpations: Abdomen is soft.     Tenderness: There is abdominal tenderness (Mild suprapubic discomfort).  Musculoskeletal:        General: Normal range of motion.     Cervical back: Normal range of motion and neck supple. No tenderness.     Right lower leg: No tenderness. No edema.     Left lower leg: No tenderness. No edema.     Comments: Mild TTP noted overlying the left posterior hip.  No midline C, T, L-spine tenderness.  Skin:    General: Skin is warm and dry.  Neurological:     General: No focal deficit present.     Mental Status: She is alert and oriented to person, place, and time.     Comments: Patient is oriented to person, place, time.  She is phonating clearly, coherently, in complete sentences.  Strength is 5 out of 5 in all 4 extremities.  Patient is able to move from a lying to standing position without difficulty.  Patient can ambulate with a steady gait.  Psychiatric:        Mood and Affect: Mood  normal.        Behavior: Behavior normal.    ED Results / Procedures / Treatments   Labs (all labs ordered are listed, but only abnormal results are displayed) Labs Reviewed  BASIC METABOLIC PANEL - Abnormal; Notable for the following components:      Result Value   Glucose, Bld 102 (*)    Creatinine, Ser 1.06 (*)    GFR calc non Af Amer 53 (*)    All other components within normal limits  CBC - Abnormal; Notable for the following components:   WBC 12.6 (*)    RBC 3.83 (*)    Hemoglobin 11.9 (*)    All other components within normal limits  URINALYSIS,  ROUTINE W REFLEX MICROSCOPIC - Abnormal; Notable for the following components:   Hgb urine dipstick SMALL (*)    Bacteria, UA RARE (*)    All other components within normal limits  TROPONIN I (HIGH SENSITIVITY)  TROPONIN I (HIGH SENSITIVITY)    EKG EKG Interpretation  Date/Time:  Saturday October 04 2019 22:51:22 EDT Ventricular Rate:  56 PR Interval:  144 QRS Duration: 76 QT Interval:  392 QTC Calculation: 378 R Axis:   62 Text Interpretation: Sinus bradycardia ST & T wave abnormality, consider inferolateral ischemia Abnormal ECG Confirmed by Pattricia Boss [phone]) on 10/04/2019 10:56:41 PM   Radiology DG Chest 2 View  Result Date: 10/04/2019 CLINICAL DATA:  Chest pain. EXAM: CHEST - 2 VIEW COMPARISON:  12/04/2016 FINDINGS: There is no acute cardiopulmonary process. The heart size is stable. Aortic calcifications are noted. There is no pneumothorax or pleural effusion. There is a subcentimeter rounded density projecting over the posterior right fifth rib, new since 2018. IMPRESSION: 1. No acute cardiopulmonary process. 2. Subcentimeter rounded density projecting over the posterior right fifth rib, new since 2018. A 4-6 week follow-up two-view chest x-ray is recommended to confirm resolution or stability of this finding. Electronically Signed   By: Constance Holster M.D.   On: 10/04/2019 23:41   CT Renal Stone Study  Result Date: 10/05/2019 CLINICAL DATA:  Left flank pain, hematuria EXAM: CT ABDOMEN AND PELVIS WITHOUT CONTRAST TECHNIQUE: Multidetector CT imaging of the abdomen and pelvis was performed following the standard protocol without IV contrast. COMPARISON:  05/26/2019 FINDINGS: Lower chest: No pleural or pericardial effusion. Coronary calcifications. Hepatobiliary: No focal liver abnormality is seen. No gallstones, gallbladder wall thickening, or biliary dilatation. Pancreas: Unremarkable. No pancreatic ductal dilatation or surrounding inflammatory changes. Spleen: Normal in size  without focal abnormality. Adrenals/Urinary Tract: Adrenal glands unremarkable. 2 mm calculus in the mid right renal collecting system. No hydronephrosis. Symmetric renal morphology. Urinary bladder nondistended. Stomach/Bowel: Stomach is nondistended with prominent submucosal fat as before. Small bowel decompressed. Appendix normal. Colon unremarkable. Vascular/Lymphatic: Moderate aortoiliac calcified atheromatous plaque without aneurysm. No abdominal or pelvic adenopathy. Reproductive: Coarse calcification in uterine fibroids. No adnexal mass. Other: No ascites.  Bilateral pelvic phleboliths.  No free air. Musculoskeletal: Lumbar dextroscoliosis with advanced multilevel spondylitic change. No fracture or worrisome bone lesion. IMPRESSION: 1. No acute findings. 2. Right nephrolithiasis without hydronephrosis. 3. Coronary and Aortic Atherosclerosis (ICD10-I70.0). Electronically Signed   By: Lucrezia Europe M.D.   On: 10/05/2019 09:36   Procedures Procedures (including critical care time)  Medications Ordered in ED Medications  sodium chloride flush (NS) 0.9 % injection 3 mL (0 mLs Intravenous Hold 10/05/19 1045)  lidocaine (LIDODERM) 5 % 1 patch (1 patch Transdermal Patch Applied 10/05/19 1043)  acetaminophen (TYLENOL) tablet 1,000 mg (1,000 mg  Oral Given 10/05/19 1044)  fluconazole (DIFLUCAN) tablet 150 mg (150 mg Oral Given 10/05/19 1044)   ED Course  I have reviewed the triage vital signs and the nursing notes.  Pertinent labs & imaging results that were available during my care of the patient were reviewed by me and considered in my medical decision making (see chart for details).  Clinical Course as of Oct 04 1101  Sun Oct 05, 2019  0835 1. No acute cardiopulmonary process. 2. Subcentimeter rounded density projecting over the posterior right fifth rib, new since 2018. A 4-6 week follow-up two-view chest x-ray is recommended to confirm resolution or stability of this finding.  DG Chest 2 View [LJ]    0900 No elevation in troponin x2  Troponin I (High Sensitivity): 11 [LJ]  0900 Hgb urine dipstick(!): SMALL [LJ]  0901 Similar to baseline values  Creatinine(!): 1.06 [LJ]  0901 Not tachycardic  Pulse Rate(!): 58 [LJ]  0946 No acute findings.  Right nephrolithiasis without hydronephrosis.  CT Renal Laren Everts [LJ]    Clinical Course User Index [LJ] Rayna Sexton, PA-C   MDM Rules/Calculators/A&P                          Pt is a 69 y.o. female that present with a history, physical exam, ED Clinical Course as noted above.   Patient presented today with multiple complaints.  Labs as well as imaging were reassuring today.  She does have right-sided nephrolithiasis without any hydronephrosis.  Patient had no right-sided abdominal or flank pain.  She was given a Lidoderm patch for her chronic left hip as well as a gram of Tylenol.  She notes moderate relief.  She feels comfortable going home at this time.  Patient noted some vaginal irritation as well.  She was given a dose of Diflucan.  Recommended continued use of Tylenol for pain.  Discussed multiple topical analgesics.  Her questions were answered and she was amicable at the time of discharge.  Recommended that she call her PCP tomorrow to schedule a follow-up appointment for later in the week.  She was given strict return precautions and understands she can return to the emergency department at anytime with new or worsening symptoms.  Her vital signs are stable the time of discharge.  Patient discharged to home/self care.  Condition at discharge: Stable  Note: Portions of this report may have been transcribed using voice recognition software. Every effort was made to ensure accuracy; however, inadvertent computerized transcription errors may be present.    Final Clinical Impression(s) / ED Diagnoses Final diagnoses:  Hip pain  Vaginal irritation   Rx / DC Orders ED Discharge Orders    None       Rayna Sexton,  PA-C 10/05/19 1107    Quintella Reichert, MD 10/06/19 1243

## 2019-10-10 ENCOUNTER — Other Ambulatory Visit: Payer: Self-pay | Admitting: Student

## 2019-10-10 ENCOUNTER — Ambulatory Visit: Payer: Medicare Other

## 2019-11-17 ENCOUNTER — Other Ambulatory Visit: Payer: Self-pay | Admitting: *Deleted

## 2020-01-06 ENCOUNTER — Other Ambulatory Visit: Payer: Self-pay | Admitting: Family

## 2020-01-06 DIAGNOSIS — Z1231 Encounter for screening mammogram for malignant neoplasm of breast: Secondary | ICD-10-CM

## 2020-01-08 ENCOUNTER — Other Ambulatory Visit: Payer: Self-pay | Admitting: Student

## 2020-01-13 ENCOUNTER — Other Ambulatory Visit: Payer: Self-pay | Admitting: Family

## 2020-01-13 DIAGNOSIS — Z1231 Encounter for screening mammogram for malignant neoplasm of breast: Secondary | ICD-10-CM

## 2020-01-14 ENCOUNTER — Other Ambulatory Visit: Payer: Self-pay | Admitting: Student

## 2020-01-14 ENCOUNTER — Other Ambulatory Visit: Payer: Self-pay | Admitting: Family

## 2020-01-14 DIAGNOSIS — M549 Dorsalgia, unspecified: Secondary | ICD-10-CM

## 2020-01-28 ENCOUNTER — Ambulatory Visit: Payer: Medicare Other

## 2020-01-29 ENCOUNTER — Other Ambulatory Visit: Payer: Medicare Other

## 2020-01-30 ENCOUNTER — Other Ambulatory Visit: Payer: Medicare Other

## 2020-01-30 ENCOUNTER — Other Ambulatory Visit: Payer: Self-pay

## 2020-01-30 ENCOUNTER — Ambulatory Visit
Admission: RE | Admit: 2020-01-30 | Discharge: 2020-01-30 | Disposition: A | Discharge status: Home / Self Care | Payer: Medicare Other | Source: Ambulatory Visit | Attending: Family | Admitting: Family

## 2020-01-30 DIAGNOSIS — M549 Dorsalgia, unspecified: Secondary | ICD-10-CM

## 2020-03-10 ENCOUNTER — Ambulatory Visit: Payer: Medicare Other

## 2020-03-10 ENCOUNTER — Other Ambulatory Visit: Payer: Self-pay

## 2020-03-10 ENCOUNTER — Other Ambulatory Visit: Payer: Self-pay | Admitting: Family

## 2020-03-10 ENCOUNTER — Ambulatory Visit
Admission: RE | Admit: 2020-03-10 | Discharge: 2020-03-10 | Disposition: A | Discharge status: Home / Self Care | Payer: Medicare Other | Source: Ambulatory Visit | Attending: Family | Admitting: Family

## 2020-03-10 DIAGNOSIS — Z1231 Encounter for screening mammogram for malignant neoplasm of breast: Secondary | ICD-10-CM

## 2020-03-31 ENCOUNTER — Other Ambulatory Visit: Payer: Self-pay

## 2020-03-31 ENCOUNTER — Encounter: Payer: Self-pay | Admitting: Family Medicine

## 2020-03-31 ENCOUNTER — Ambulatory Visit (INDEPENDENT_AMBULATORY_CARE_PROVIDER_SITE_OTHER): Payer: Medicare Other | Admitting: Family Medicine

## 2020-03-31 VITALS — BP 140/72 | HR 72 | Wt 178.6 lb

## 2020-03-31 DIAGNOSIS — M25522 Pain in left elbow: Secondary | ICD-10-CM

## 2020-03-31 DIAGNOSIS — Z0000 Encounter for general adult medical examination without abnormal findings: Secondary | ICD-10-CM | POA: Diagnosis not present

## 2020-03-31 DIAGNOSIS — E785 Hyperlipidemia, unspecified: Secondary | ICD-10-CM

## 2020-03-31 HISTORY — DX: Pain in left elbow: M25.522

## 2020-03-31 MED ORDER — ATORVASTATIN CALCIUM 40 MG PO TABS: 40.0000 mg | ORAL_TABLET | Freq: Every day | ORAL | 3 refills | Status: DC

## 2020-03-31 NOTE — Assessment & Plan Note (Signed)
Patient with your long history of left elbow pain.  She has been evaluated by multiple providers for this.  Imaging is reassuring and shows no fractures.  She was evaluated at [facility] I believe this may be triceps tendinitis.  Physical exam is consistent with triceps tendinitis.  Recommended NSAIDs.  Patient reports that she has prescription filled for celecoxib but has not started taking it out of concern for side effects.  Recommended that she try this, ice, heat, and if this fails an injection could be an option.  Patient reports that she is afraid of needles and would prefer not to have an injection.  We will follow-up as needed.

## 2020-03-31 NOTE — Assessment & Plan Note (Signed)
Patient is here reestablishing with our clinic.  Today we discussed her left elbow pain.  Patient also has concerns regarding tingling in her fingertips in the morning which resolves after a few minutes.  She will follow up on this at a later date. -At next visit recommend if still having issues with tingling consider lab work such as TSH, CBC, vitamin B12 -Did not check on patient's Covid vaccine status, follow-up at next visit

## 2020-03-31 NOTE — Progress Notes (Signed)
    SUBJECTIVE:   CHIEF COMPLAINT / HPI:   Here to re-establish care  Patient reports that she has been seeing another provider for the past several months and has continued to have elbow pain which she had issues with before she left our practice.  She she was given pain medications for this and had a repeat x-ray which she reports just showed arthritis.  The pain is still present.  She denies any swelling, warmth, redness.  Patient denies any injury.  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  She says that her previous provider told her that she had arthritis in her elbow but that she does not think that that is what it is and she wants the pain managed.  She reports she is taken ibuprofen as well as naproxen and that it upset her stomach.  She was given a prescription for Celebrex but never started taking it because she read the warning label and did not think it was safe.  Patient did not acknowledge this initially but after review of PDMP the provider had been prescribing her 50 mg tramadol 4 times a day for the past 6 months but stopped prescribing in November 2021.  Other concerns at the patient have include that she reports that when she wakes up in the morning she feels like her fingertips are tingling in both hands but this resolves after several minutes.  She denies any weakness.  She reports that this occurs almost daily.  Medication concerns Patient needs a refill on her statin medication.  She says she has plenty of her other medications but will ask for refills when needed.  OBJECTIVE:   BP 140/72   Pulse 72   Wt 178 lb 9.6 oz (81 kg)   SpO2 99%   BMI 29.72 kg/m   General: Well-appearing 70 year old female, intermittently rubbing her left elbow Cardiac: Regular rate and rhythm, no murmurs appreciated Respiratory: Normal work of breathing, lungs clear to auscultation bilaterally Abdomen: Soft, nontender, positive bowel  sounds MSK: Left elbow with full range of motion, no gross abnormalities, tenderness to palpation on olecranon, no swelling, erythema, warmth noted  ASSESSMENT/PLAN:   Left elbow pain Patient with your long history of left elbow pain.  She has been evaluated by multiple providers for this.  Imaging is reassuring and shows no fractures.  She was evaluated at [facility] I believe this may be triceps tendinitis.  Physical exam is consistent with triceps tendinitis.  Recommended NSAIDs.  Patient reports that she has prescription filled for celecoxib but has not started taking it out of concern for side effects.  Recommended that she try this, ice, heat, and if this fails an injection could be an option.  Patient reports that she is afraid of needles and would prefer not to have an injection.  We will follow-up as needed.  Healthcare maintenance Patient is here reestablishing with our clinic.  Today we discussed her left elbow pain.  Patient also has concerns regarding tingling in her fingertips in the morning which resolves after a few minutes.  She will follow up on this at a later date. -At next visit recommend if still having issues with tingling consider lab work such as TSH, CBC, vitamin B12 -Did not check on patient's Covid vaccine status, follow-up at next visit     Derrel Nip, MD [facility]

## 2020-03-31 NOTE — Patient Instructions (Signed)
It was great to meet you today.  I am sorry you are still having these issues with your left elbow pain.  I think it is most likely with called a tendinopathy.  I recommend you take your celecoxib as prescribed and continue using ice, heat, topical creams such as Biofreeze or icy hot or Voltaren gel.  If you have any worsening symptoms please feel free to call the clinic.  I hope you have a wonderful afternoon!   Triceps Tendinitis  Triceps tendinitis is inflammation of the triceps tendon, which is located behind the elbow. The triceps tendon is a strong cord of tissue that connects the triceps muscle, on the back of the upper arm, to a bone in the elbow (ulna). The triceps muscle helps to bend and straighten the elbow. Triceps tendinitis can make it difficult to do both of these movements. Triceps tendinitis may include a grade 1 or grade 2 strain of the tendon.  A grade 1 strain is mild, and involves a slight pull of the tendon without any stretching or noticeable tearing of the tendon. There is usually no loss of triceps muscle strength.  A grade 2 strain is moderate, and involves a small tear in the tendon. The tendon is stretched and triceps strength is usually decreased. What are the causes? This condition may be caused by:  Overuse of the triceps muscle.  A direct, forceful hit or injury to the triceps tendon. What increases the risk? You are more likely to develop this condition if:  You participate in sports or activities where you need to suddenly tighten the triceps muscle, such as biking or motorcycle riding.  You play sports where you must move your arm against resistance, such as weight lifting or bodybuilding.  You have poor strength and flexibility of the arm and shoulder.  You use steroids. What are the signs or symptoms? Symptoms of this condition include:  Pain and inflammation in the back of the elbow and upper arm.  Bruising in the back of the elbow and upper  arm.  Decreased strength when straightening the elbow or gripping objects.  A crackling sound (crepitation) when moving or touching the elbow or upper arm. In some cases, symptoms may return after treatment, and they may be long-lasting. How is this diagnosed? This condition is diagnosed based on your symptoms, your medical history, and a physical exam. You may have tests, including X-rays or an MRI. How is this treated? This condition is treated by resting and icing the injured area, and by doing exercises that help with arm movement and strength (physical therapy). Treatment may also include:  Taking over-the-counter medicines that help to relieve pain and inflammation.  Keeping the elbow in place for a period of time (immobilization). This may be done by wearing a brace or a sling on your elbow.  Having an injection of an anti-inflammatory medicine (steroid) mixed with a numbing medicine (local anesthetic).  Having surgery to repair the triceps tendon. This is rare. Follow these instructions at home: If you have a brace or sling:  Wear it as told by your health care provider. Remove it only as told by your health care provider.  Loosen it if your fingers tingle, become numb, or turn cold and blue.  Keep it clean and dry. Bathing  Do not take baths, swim, or use a hot tub until your health care provider approves.  If the brace or sling is not waterproof: ? Do not let it get wet. ?  Cover it with a waterproof covering when you take a bath or shower. Managing pain, stiffness, and swelling      If directed, put ice on the injured area. ? If you have a removable brace or sling, remove it as told by your health care provider. ? Put ice in a plastic bag. ? Place a towel between your skin and the bag. ? Leave the ice on for 20 minutes, 2-3 times a day.  If directed, apply heat to the affected area before you exercise. Use the heat source that your health care provider  recommends, such as a moist heat pack or a heating pad. ? If you have a removable brace or sling, remove it as told by your health care provider. ? Place a towel between your skin and the heat source. ? Leave the heat on for 20-30 minutes. ? Remove the heat if your skin turns bright red. This is especially important if you are unable to feel pain, heat, or cold. You may have a greater risk of getting burned.  Move your fingers often to reduce stiffness and swelling.  Raise (elevate) the injured area above the level of your heart while you are sitting or lying down. Activity  Do not lift anything that is heavier than 10 lb (4.5 kg) until your health care provider says that it is safe.  Ask your health care provider when it is safe to drive if you have a brace or sling on your arm.  Avoid activities that cause pain or make your condition worse.  Do exercises as told by your health care provider.  Return to your normal activities as told by your health care provider. Ask your health care provider what activities are safe for you. General instructions  Take over-the-counter and prescription medicines only as told by your health care provider.  Keep all follow-up visits as told by your health care provider. This is important. How is this prevented?  Warm up and stretch before being active.  Cool down and stretch after being active.  Give your body time to rest between periods of activity.  Maintain physical fitness, including strength and flexibility.  Wear a brace or tape your arm when playing contact sports, as told by your health care provider.  Be safe and responsible while being active. This will help you to avoid falls. Contact a health care provider if:  You have symptoms that get worse or do not get better after 2 weeks of treatment.  You have new symptoms. Get help right away if:  You have severe pain.  You have numbness or tingling in your hand.  Your hand feels  unusually cold.  Your fingernails turn a dark color, such as blue or gray. Summary  Triceps tendinitis is inflammation of the triceps tendon, which is located behind the elbow.  This condition may be caused by overuse or injury to the area.  Symptoms may include pain, bruising, decreased strength when straightening the elbow or gripping objects, and a crackling noise when moving or touching the elbow or upper arm.  This condition may be treated with rest, ice, medicines, exercises to help with movement and strength (physical therapy), and keeping the elbow in place for a period of time (immobilization). This information is not intended to replace advice given to you by your health care provider. Make sure you discuss any questions you have with your health care provider. Document Revised: 07/09/2018 Document Reviewed: 06/12/2018 [employer] Patient Education  2020 [employer]  

## 2020-04-13 ENCOUNTER — Other Ambulatory Visit: Payer: Self-pay | Admitting: Family Medicine

## 2020-04-19 ENCOUNTER — Other Ambulatory Visit: Payer: Self-pay | Admitting: Student in an Organized Health Care Education/Training Program

## 2020-04-19 ENCOUNTER — Telehealth: Payer: Self-pay | Admitting: Student in an Organized Health Care Education/Training Program

## 2020-04-19 ENCOUNTER — Other Ambulatory Visit: Payer: Self-pay

## 2020-04-19 DIAGNOSIS — E876 Hypokalemia: Secondary | ICD-10-CM

## 2020-04-19 MED ORDER — POTASSIUM CHLORIDE CRYS ER 20 MEQ PO TBCR
20.0000 meq | EXTENDED_RELEASE_TABLET | Freq: Every day | ORAL | 0 refills | Status: DC
Start: 2020-04-19 — End: 2020-06-10

## 2020-04-19 NOTE — Telephone Encounter (Signed)
Pt walked in stating she is out of Potassium and nasal spray;  She has been in contact with the pharmacy and has called the nurse line since Thursday. Ph# [phone]

## 2020-04-19 NOTE — Telephone Encounter (Signed)
Patient will need an appointment with electrolyte check prior to further refills

## 2020-04-20 NOTE — Telephone Encounter (Signed)
I refilled her potassium and flonase x1 month already.  Also noted patient needs a follow up appointment to check K+

## 2020-05-24 ENCOUNTER — Other Ambulatory Visit: Payer: Self-pay | Admitting: Family Medicine

## 2020-05-24 ENCOUNTER — Other Ambulatory Visit: Payer: Self-pay

## 2020-05-24 DIAGNOSIS — E876 Hypokalemia: Secondary | ICD-10-CM

## 2020-05-24 NOTE — Telephone Encounter (Signed)
Patient calls nurse line and is very upset regarding rx request being denied. Patient reports that she was just seen in the office on 1/5. Informed patient that labs had not been drawn and that she needs a follow up to check potassium levels. Patient expressed frustration that this was not checked at previous appointment.   Apologized for inconvenience in having to come in for another appointment. Patient states that she shouldn't have to come in for another appointment, since "I only need potassium pills"  Patient was at the pharmacy at time of phone call and was no longer responding in our conversation.   Talbot Grumbling, RN

## 2020-05-27 ENCOUNTER — Other Ambulatory Visit: Payer: Self-pay | Admitting: Student in an Organized Health Care Education/Training Program

## 2020-05-27 DIAGNOSIS — E876 Hypokalemia: Secondary | ICD-10-CM

## 2020-06-01 ENCOUNTER — Telehealth: Payer: Self-pay

## 2020-06-01 NOTE — Telephone Encounter (Signed)
Patient calls nurse line regarding potassium rx. Patient states that she spoke with someone approx 30 minutes ago from our office stating that rx had been sent to pharmacy.   Spoke with [facility], who had spoke with patient to schedule appointment. Advised patient that we would be unable to send in this rx until she was seen in the office.    Patient became very upset. Stating that this should have been done at the last visit and she does not have the gas money to keep driving around to the pharmacy and the doctor. Patient proceeds to say this it is not her fault that the labs were not drawn at the last visit.   Informed patient that it would not be safe to send in rx without these updated labs. Patient questions me, asking "do you know how old I am" and "I cannot keep coming up there for no reason". Patient then hung up the phone.    Patient has scheduled appointment with Dr. Caron Presume on 3/17.  Talbot Grumbling, RN

## 2020-06-10 ENCOUNTER — Other Ambulatory Visit: Payer: Self-pay

## 2020-06-10 ENCOUNTER — Encounter: Payer: Self-pay | Admitting: Family Medicine

## 2020-06-10 ENCOUNTER — Ambulatory Visit (INDEPENDENT_AMBULATORY_CARE_PROVIDER_SITE_OTHER): Payer: Medicare Other | Admitting: Family Medicine

## 2020-06-10 ENCOUNTER — Other Ambulatory Visit: Payer: Self-pay | Admitting: Family Medicine

## 2020-06-10 VITALS — BP 144/68 | HR 69 | Ht 65.0 in | Wt 186.2 lb

## 2020-06-10 DIAGNOSIS — M25522 Pain in left elbow: Secondary | ICD-10-CM | POA: Diagnosis not present

## 2020-06-10 DIAGNOSIS — E876 Hypokalemia: Secondary | ICD-10-CM | POA: Diagnosis not present

## 2020-06-10 DIAGNOSIS — I10 Essential (primary) hypertension: Secondary | ICD-10-CM | POA: Diagnosis not present

## 2020-06-10 MED ORDER — POTASSIUM CHLORIDE CRYS ER 20 MEQ PO TBCR: 20.0000 meq | EXTENDED_RELEASE_TABLET | Freq: Every day | ORAL | 0 refills | Status: DC

## 2020-06-10 NOTE — Patient Instructions (Signed)
It was a pleasure seeing you today.  I am collecting lab work today to make sure your potassium levels are normal as well as your kidney function is okay.  We are not going to change your blood pressure medications today but I would like for you to keep a diary of your blood pressures and free to follow-up in 1 month for a blood pressure visit to see if we need to adjust your medications.  If you have any questions or concerns please will free to call the clinic.  I hope you have a wonderful afternoon!

## 2020-06-10 NOTE — Progress Notes (Signed)
    SUBJECTIVE:   CHIEF COMPLAINT / HPI:   Elbow pain  Patient reports that she has been having elbow pain for years.  She had imaging completed previously which did not show anything.  She tried the stretching and rehab exercises without any relief.  She takes Tylenol for pain or discomfort.  Denies any swelling, erythema, warmth to the elbow.  The pain is right over her olecranon.  Denies any injuries to this.  HTN  Patient reports that she has been compliant with her blood pressure medications and she will use has not had any symptoms of elevated blood pressure or hypotension.  Her blood pressure is mildly elevated today but looking at previous visits it is higher than his previous visits.  She does not keep a blood pressure log at this time.   OBJECTIVE:   BP (!) 144/68   Pulse 69   Ht 5\' 5"  (1.651 m)   Wt 186 lb 3.2 oz (84.5 kg)   SpO2 98%   BMI 30.99 kg/m   General: Well-appearing 70 year old female, resting comfortably in chair next to exam table Cardiac: Regular rate and rhythm, no murmurs appreciated Respiratory: Normal work of breathing MSK: Patient with tenderness to palpation on left olecranon process, no edema, erythema, warmth noted in the elbow but marked tenderness when pressing on it.  ASSESSMENT/PLAN:   Hypertension Blood pressure poorly controlled on evaluation today.  We will continue on current blood pressure regimen at this time and patient is going to keep a blood pressure diary over the next month.  We will follow-up in 1 month and make adjustments as needed to her blood pressure medication regimen. -BMP collected today to assess kidney function -Also checking potassium levels -Refilled Klor-Con 20 mEq daily.  If her potassium comes back and is high I will call the patient to inform her that she should not take the potassium  Left elbow pain Patient continues to have left elbow pain.  It is helped with Tylenol but immediately returns.  No injury reported.   She was evaluated in the past at [facility] and they believe it may be triceps tendinitis.  We are going to reimage the elbow because it has been several years since she had that done and may need to refer her to sports medicine for rehab.  We will follow up with [place] as needed.     Gifford Shave, MD [place]

## 2020-06-11 LAB — BASIC METABOLIC PANEL
BUN/Creatinine Ratio: 10 — ABNORMAL LOW (ref 12–28)
BUN: 13 mg/dL (ref 8–27)
CO2: 25 mmol/L (ref 20–29)
Calcium: 9.7 mg/dL (ref 8.7–10.3)
Chloride: 101 mmol/L (ref 96–106)
Creatinine, Ser: 1.28 mg/dL — ABNORMAL HIGH (ref 0.57–1.00)
Glucose: 78 mg/dL (ref 65–99)
Potassium: 4.1 mmol/L (ref 3.5–5.2)
Sodium: 141 mmol/L (ref 134–144)
eGFR: 45 mL/min/{1.73_m2} — ABNORMAL LOW (ref >59)

## 2020-06-12 ENCOUNTER — Telehealth: Payer: Self-pay | Admitting: Family Medicine

## 2020-06-12 NOTE — Assessment & Plan Note (Signed)
Patient continues to have left elbow pain.  It is helped with Tylenol but immediately returns.  No injury reported.  She was evaluated in the past at [facility] and they believe it may be triceps tendinitis.  We are going to reimage the elbow because it has been several years since she had that done and may need to refer her to sports medicine for rehab.  We will follow up with [place] as needed.

## 2020-06-12 NOTE — Telephone Encounter (Signed)
Called patient regarding her lab results.  She reported that prior to her visit with me she has been taking her 20 mEq of potassium every day.  Instructed her to continue taking this because her potassium levels were normal.  Informed her of her mild increase in creatinine and we will recheck this at the next visit.

## 2020-06-12 NOTE — Assessment & Plan Note (Signed)
Blood pressure poorly controlled on evaluation today.  We will continue on current blood pressure regimen at this time and patient is going to keep a blood pressure diary over the next month.  We will follow-up in 1 month and make adjustments as needed to her blood pressure medication regimen. -BMP collected today to assess kidney function -Also checking potassium levels -Refilled Klor-Con 20 mEq daily.  If her potassium comes back and is high I will call the patient to inform her that she should not take the potassium

## 2020-06-19 ENCOUNTER — Encounter (HOSPITAL_COMMUNITY): Payer: Self-pay

## 2020-06-19 ENCOUNTER — Emergency Department (HOSPITAL_COMMUNITY)
Admission: EM | Admit: 2020-06-19 | Discharge: 2020-06-20 | Disposition: A | Discharge status: Home / Self Care | Payer: Medicare Other | Attending: Emergency Medicine | Admitting: Emergency Medicine

## 2020-06-19 ENCOUNTER — Other Ambulatory Visit: Payer: Self-pay

## 2020-06-19 ENCOUNTER — Emergency Department (HOSPITAL_COMMUNITY): Payer: Medicare Other

## 2020-06-19 DIAGNOSIS — R197 Diarrhea, unspecified: Secondary | ICD-10-CM | POA: Diagnosis not present

## 2020-06-19 DIAGNOSIS — Z87891 Personal history of nicotine dependence: Secondary | ICD-10-CM | POA: Insufficient documentation

## 2020-06-19 DIAGNOSIS — R1030 Lower abdominal pain, unspecified: Secondary | ICD-10-CM | POA: Diagnosis not present

## 2020-06-19 DIAGNOSIS — Z79899 Other long term (current) drug therapy: Secondary | ICD-10-CM | POA: Diagnosis not present

## 2020-06-19 DIAGNOSIS — I10 Essential (primary) hypertension: Secondary | ICD-10-CM

## 2020-06-19 DIAGNOSIS — Z91040 Latex allergy status: Secondary | ICD-10-CM | POA: Insufficient documentation

## 2020-06-19 DIAGNOSIS — Z7982 Long term (current) use of aspirin: Secondary | ICD-10-CM | POA: Diagnosis not present

## 2020-06-19 DIAGNOSIS — R079 Chest pain, unspecified: Secondary | ICD-10-CM | POA: Diagnosis present

## 2020-06-19 LAB — URINALYSIS, ROUTINE W REFLEX MICROSCOPIC
Bilirubin Urine: NEGATIVE
Glucose, UA: NEGATIVE mg/dL
Hgb urine dipstick: NEGATIVE
Ketones, ur: NEGATIVE mg/dL
Leukocytes,Ua: NEGATIVE
Nitrite: NEGATIVE
Protein, ur: NEGATIVE mg/dL
Specific Gravity, Urine: 1.017 (ref 1.005–1.030)
pH: 6 (ref 5.0–8.0)

## 2020-06-19 LAB — HEPATIC FUNCTION PANEL
ALT: 20 U/L (ref 0–44)
AST: 27 U/L (ref 15–41)
Albumin: 3.7 g/dL (ref 3.5–5.0)
Alkaline Phosphatase: 81 U/L (ref 38–126)
Bilirubin, Direct: <0.1 mg/dL (ref 0.0–0.2)
Total Bilirubin: 0.4 mg/dL (ref 0.3–1.2)
Total Protein: 7.1 g/dL (ref 6.5–8.1)

## 2020-06-19 LAB — TROPONIN I (HIGH SENSITIVITY): Troponin I (High Sensitivity): 8 ng/L (ref <18)

## 2020-06-19 LAB — CBC
HCT: 36.8 % (ref 36.0–46.0)
Hemoglobin: 12.1 g/dL (ref 12.0–15.0)
MCH: 32 pg (ref 26.0–34.0)
MCHC: 32.9 g/dL (ref 30.0–36.0)
MCV: 97.4 fL (ref 80.0–100.0)
Platelets: 286 10*3/uL (ref 150–400)
RBC: 3.78 MIL/uL — ABNORMAL LOW (ref 3.87–5.11)
RDW: 13.8 % (ref 11.5–15.5)
WBC: 11.1 10*3/uL — ABNORMAL HIGH (ref 4.0–10.5)
nRBC: 0 % (ref 0.0–0.2)

## 2020-06-19 LAB — BASIC METABOLIC PANEL
Anion gap: 6 (ref 5–15)
BUN: 13 mg/dL (ref 8–23)
CO2: 26 mmol/L (ref 22–32)
Calcium: 9.1 mg/dL (ref 8.9–10.3)
Chloride: 106 mmol/L (ref 98–111)
Creatinine, Ser: 0.99 mg/dL (ref 0.44–1.00)
GFR, Estimated: >60 mL/min (ref >60)
Glucose, Bld: 106 mg/dL — ABNORMAL HIGH (ref 70–99)
Potassium: 3.8 mmol/L (ref 3.5–5.1)
Sodium: 138 mmol/L (ref 135–145)

## 2020-06-19 LAB — LIPASE, BLOOD: Lipase: 29 U/L (ref 11–51)

## 2020-06-19 MED ORDER — ASPIRIN 81 MG PO CHEW
324.0000 mg | CHEWABLE_TABLET | Freq: Once | ORAL | Status: AC
  Administered 2020-06-19: 324 mg via ORAL
  Filled 2020-06-19: qty 4

## 2020-06-19 NOTE — ED Provider Notes (Signed)
[facility] EMERGENCY DEPARTMENT Provider Note   CSN: [id number] Arrival date & time: 06/19/20  2046     History Chief Complaint  Patient presents with  . Chest Pain    Eldora Napp is a 70 y.o. female with a hx of hypertension, hyperlipidemia,and osteopenia who presents to the ED with primary concern of high blood pressure tonight. Patient states that she decided to check her BP tonight as she was not feeling well today, it was 200s/100s, rechecked and 180s/90s which concerned her and prompted ED visit tonight. She further describes not feeling well today as having intermittent chest pain- left sided aching, lasts a few seconds at a time, no triggers or alleviating/aggravating factors, has been occurring fairly infrequently and has not been specific to exertion. Also has had 2 days of lower abdominal pain with a few episodes of non bloody diarrhea per day. Denies fever, dyspnea, emesis, melena, hematochezia, diaphoresis, syncope, or dysuria. Denies recent abx or recent foreign travel.   HPI     Past Medical History:  Diagnosis Date  . Abnormal EKG 2012   hospitalized for T wave inversion in lateral leads with MSK chest pains, normal ECHO, negative trops, no cardiology consult. recent EKG 7/15 stable T wave inversions.   . 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 Arthritis 2000  . Arthritis 2011  . Clotting disorder ([place])    blood clot kidney 2017  . Heart murmur   . Hyperlipidemia 2011  . Hypertension 2011  . Meniscus tear 2000   L KNEE  . 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 Left elbow pain 03/31/2020  . Contact dermatitis 01/14/2019  . Vaginal itching 12/05/2018  . Pelvic pain 10/07/2018  . Leg edema 08/28/2018  . Thickened endometrium   . Umbilical hernia without obstruction and without gangrene 01/01/2018  . Osteopenia 09/05/2017  . 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 Renal  infarct ([place]) 09/30/2015  . HLD (hyperlipidemia)   . Aortic atherosclerosis ([place])   . Back pain 07/08/2015  . 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 Bilateral leg pain 06/14/2015  . Seasonal allergies 03/09/2015  . Hypopigmentation 03/09/2015  . Osteoarthritis of left knee 07/08/2014  . DJD (degenerative joint disease), lumbar 07/08/2014  . Osteoarthritis of right knee 07/08/2014  . Urinary frequency 06/11/2014  . Healthcare maintenance 06/11/2014  . Allergic rhinoconjunctivitis of both eyes 06/11/2014  . Nearsightedness 04/27/2014  . Abnormal EKG   . Hypertension 12/11/2013    Past Surgical History:  Procedure Laterality Date  . ABDOMINAL SURGERY  2010   for bowel blockage  . COLON SURGERY  2010   bowel blockage  . COLONOSCOPY  09/2014  . HYSTEROSCOPY WITH D & C N/A 05/08/2018   Procedure: DILATATION AND CURETTAGE /HYSTEROSCOPY;  Surgeon: Mora Bellman, MD;  Location: [facility];  Service: Gynecology;  Laterality: N/A;  . KNEE ARTHROSCOPY Left 09/25/2013   Procedure: LEFT KNEE ARTHROSCOPY WITH PARTIAL MEDIAL AND LATERAL  MENISCECTOMY/DEBRIDEMENT/MICRO FRACTURE MEDIAL FEMORAL CONDYLE, REMOVAL OF OSTEOCONDRAL FRAGMENT;  Surgeon: Johnn Hai, MD;  Location: [facility];  Service: Orthopedics;  Laterality: Left;  . KNEE SURGERY Right 1999  . POLYPECTOMY    . TEE WITHOUT CARDIOVERSION N/A 10/04/2015   Procedure: TRANSESOPHAGEAL ECHOCARDIOGRAM (TEE);  Surgeon: Sueanne Margarita, MD;  Location: [facility];  Service: Cardiovascular;  Laterality: N/A;  . TUBAL LIGATION  1978     OB History    Gravida  2   Para  2   Term      Preterm      AB      Living  2     SAB      IAB      Ectopic      Multiple      Live Births              Family History  Problem Relation Age of Onset  . Hypertension Mother   . Heart disease Mother        has a pacemaker   . Alzheimer's disease Mother   . Hyperlipidemia Mother   . Osteoporosis Mother    . Heart failure Father   . Hypertension Father   . Alzheimer's disease Father   . Hypertension Sister   . Breast cancer Sister   . Hypertension Sister   . Gout Sister   . Alcohol abuse Son   . Pancreatitis Son   . Cancer Neg Hx   . Colon cancer Neg Hx   . Esophageal cancer Neg Hx   . Stomach cancer Neg Hx   . Rectal cancer Neg Hx     Social History   Tobacco Use  . Smoking status: Former Smoker    Quit date: 07/29/2010    Years since quitting: 9.8  . Smokeless tobacco: Never Used  Vaping Use  . Vaping Use: Never used  Substance Use Topics  . Alcohol use: Never    Alcohol/week: 0.0 standard drinks  . Drug use: Never    Home Medications Prior to Admission medications   Medication Sig Start Date End Date Taking? Authorizing Provider  aspirin EC 81 MG tablet Take 1 tablet (81 mg total) daily by mouth. 01/31/17   Clent Demark, PA-C  atorvastatin (LIPITOR) 40 MG tablet Take 1 tablet (40 mg total) by mouth daily at 6 PM. 03/31/20   Gifford Shave, MD  carvedilol (COREG) 12.5 MG tablet TAKE 1 TABLET(12.5 MG) BY MOUTH TWICE DAILY WITH A MEAL Patient taking differently: Take 12.5 mg by mouth 2 (two) times daily with a meal.  08/08/19   Rumball, Jake Church, DO  fluticasone (FLONASE) 50 MCG/ACT nasal spray SHAKE LIQUID AND USE 1 SPRAY IN EACH NOSTRIL EVERY DAY 04/20/20   Ouida Sills, Chelsey L, DO  hydrochlorothiazide (HYDRODIURIL) 25 MG tablet TAKE 1 TABLET BY MOUTH DAILY Patient taking differently: Take 25 mg by mouth daily.  09/21/19   Caroline More, DO  losartan (COZAAR) 25 MG tablet TAKE 1 TABLET BY MOUTH EVERY NIGHT AT BEDTIME Patient taking differently: Take 25 mg by mouth at bedtime.  06/26/19   Caroline More, DO  Multiple Vitamin (MULITIVITAMIN WITH MINERALS) TABS Take 1 tablet by mouth daily.    [provider]  potassium chloride SA (KLOR-CON) 20 MEQ tablet Take 1 tablet (20 mEq total) by mouth daily. 06/10/20   Gifford Shave, MD  traMADol (ULTRAM) 50 MG tablet  Take 50 mg by mouth every 6 (six) hours as needed for pain. 09/08/19   [provider]    Allergies    Shrimp [shellfish allergy], Latex, and Penicillins  Review of Systems   Review of Systems  Constitutional: Negative for diaphoresis and fever.  Respiratory: Negative for shortness of breath.   Cardiovascular: Positive for chest pain.  Gastrointestinal: Positive for abdominal pain and diarrhea. Negative for anal bleeding, blood in stool, constipation, nausea and vomiting.  Genitourinary:  Negative for dysuria.  Neurological: Negative for dizziness and syncope.  All other systems reviewed and are negative.   Physical Exam Updated Vital Signs BP (!) 188/85 (BP Location: Right Arm)   Pulse 71   Temp (!) 97.5 F (36.4 C) (Oral)   Resp 18   SpO2 97%   Physical Exam Vitals and nursing note reviewed.  Constitutional:      General: She is not in acute distress.    Appearance: She is well-developed. She is not toxic-appearing.  HENT:     Head: Normocephalic and atraumatic.  Eyes:     General:        Right eye: No discharge.        Left eye: No discharge.     Conjunctiva/sclera: Conjunctivae normal.  Cardiovascular:     Rate and Rhythm: Normal rate and regular rhythm.     Pulses:          Radial pulses are 2+ on the right side and 2+ on the left side.  Pulmonary:     Effort: Pulmonary effort is normal. No respiratory distress.     Breath sounds: Normal breath sounds. No wheezing, rhonchi or rales.  Chest:     Chest wall: No tenderness.  Abdominal:     General: There is no distension.     Palpations: Abdomen is soft.     Tenderness: There is abdominal tenderness (mild lower abdominal tenderness). There is no guarding or rebound.  Musculoskeletal:     Cervical back: Neck supple.  Skin:    General: Skin is warm and dry.     Findings: No rash.  Neurological:     Mental Status: She is alert.     Comments: Clear speech.   Psychiatric:        Behavior: Behavior  normal.     ED Results / Procedures / Treatments   Labs (all labs ordered are listed, but only abnormal results are displayed) Labs Reviewed  BASIC METABOLIC PANEL - Abnormal; Notable for the following components:      Result Value   Glucose, Bld 106 (*)    All other components within normal limits  CBC - Abnormal; Notable for the following components:   WBC 11.1 (*)    RBC 3.78 (*)    All other components within normal limits  RESP PANEL BY RT-PCR (FLU A&B, COVID) ARPGX2  HEPATIC FUNCTION PANEL  LIPASE, BLOOD  URINALYSIS, ROUTINE W REFLEX MICROSCOPIC  TROPONIN I (HIGH SENSITIVITY)  TROPONIN I (HIGH SENSITIVITY)    EKG None  Radiology DG Chest 2 View  Result Date: 06/19/2020 CLINICAL DATA:  Chest pain EXAM: CHEST - 2 VIEW COMPARISON:  10/04/2019 FINDINGS: Cardiac shadow is within normal limits. Mild aortic calcifications are seen. Lungs are well aerated bilaterally. Tiny calcified granuloma is noted in the right upper lobe. No focal infiltrate or effusion is seen. No bony abnormality is noted. IMPRESSION: No acute abnormality noted. Electronically Signed   By: Inez Catalina M.D.   On: 06/19/2020 21:53   CT ABDOMEN PELVIS W CONTRAST  Result Date: 06/20/2020 CLINICAL DATA:  Chooses suprapubic pain with elevated white blood cells nausea and diarrhea, diverticulitis suspected. EXAM: CT ABDOMEN AND PELVIS WITH CONTRAST TECHNIQUE: Multidetector CT imaging of the abdomen and pelvis was performed using the standard protocol following bolus administration of intravenous contrast. CONTRAST:  18mL OMNIPAQUE IOHEXOL 300 MG/ML  SOLN COMPARISON:  CT abdomen pelvis October 05, 2019 and May 26, 2019 FINDINGS: Lower chest: No acute abnormality. Normal  size heart. No significant pericardial effusion/thickening. Coronary artery calcifications. Hepatobiliary: No suspicious hepatic lesion. Gallbladder is unremarkable. No biliary ductal dilation. Pancreas: Unremarkable. No pancreatic ductal dilatation or  surrounding inflammatory changes. Spleen: Normal in size without focal abnormality. Adrenals/Urinary Tract: Adrenal glands are unremarkable. No hydronephrosis. Right upper pole renal scarring. No solid enhancing renal lesions. Punctate nonobstructive right nephrolithiasis. Urinary bladder is grossly unremarkable for degree of distension. Stomach/Bowel: Stomach is unremarkable for degree of distension. No suspicious small bowel wall thickening or dilation. Appendix and terminal ileum are unremarkable. Scattered colonic diverticulosis without findings of acute diverticulitis Vascular/Lymphatic: Aortic atherosclerosis. No enlarged abdominal or pelvic lymph nodes. Reproductive: Calcified uterine leiomyomas.  No adnexal lesion. Other: Fat containing left inguinal hernia. Musculoskeletal: Lumbar spondylosis.  No acute osseous abnormality. IMPRESSION: 1. No acute abnormality in the abdomen or pelvis. 2. Scattered colonic diverticulosis without findings of acute diverticulitis. 3. Fat containing left inguinal hernia. 4. Aortic atherosclerosis. Aortic Atherosclerosis (ICD10-I70.0). Electronically Signed   By: Dahlia Bailiff MD   On: 06/20/2020 00:38    Procedures Procedures   Medications Ordered in ED Medications  aspirin chewable tablet 324 mg (324 mg Oral Given 06/19/20 2259)  iohexol (OMNIPAQUE) 300 MG/ML solution 100 mL (100 mLs Intravenous Contrast Given 06/20/20 0011)    ED Course  I have reviewed the triage vital signs and the nursing notes.  Pertinent labs & imaging results that were available during my care of the patient were reviewed by me and considered in my medical decision making (see chart for details).    MDM Rules/Calculators/A&P                          Patient presents to the ED with complaints of elevated blood pressure tonight with intermittent chest pain today and 2 days of lower abdominal pain and diarrhea.  Nontoxic, resting comfortably, her blood pressure is currently elevated,  otherwise vitals are unremarkable.  Additional history obtained:  Additional history obtained from chart review & nursing note review.   Last echocardiogram 08/2019: Last EF 73-42%, grade I diastolic dysfunction  EKG: No STEMI, nonspecific T wave changes present.  Lab Tests:  I Ordered, reviewed, and interpreted labs, which included:  CBC: Mild leukocytosis BMP: Grossly unremarkable Hepatic function panel/lipase: Within normal limits Urinalysis: Unremarkable Troponin: WNL covid test: Pending  Imaging Studies ordered:  Chest x-ray ordered per triage protocol, I independently reviewed, formal radiology impression shows: No acute abnormality noted.  I ordered a CT A/P:  1. No acute abnormality in the abdomen or pelvis. 2. Scattered colonic diverticulosis without findings of acute diverticulitis. 3. Fat containing left inguinal hernia. 4. Aortic atherosclerosis. Aortic Atherosclerosis (ICD10-I70.0).  ED Course:  Overall reassuring work-up in the emergency department.  Heart score is 4, pain seems somewhat atypical, not exertional, very brief in nature, troponins are flat, low suspicion for ACS, will provide cardiology follow-up.  Low risk [place], low suspicion for pulmonary embolism.  Symmetric pulses, no widened mediastinum on chest x-ray, low suspicion for dissection.  CT abdomen/pelvis without significant acute abnormalities.  Repeat abdominal exam remains without peritoneal signs.  Her blood pressure was initially 180s/80s on arrival, on my last assessment of the patient her blood pressure is 142/75, we discussed option of further blood pressure medication management from an emergency department standpoint with medication adjustment versus following up closely with her primary care provider for further adjustment, she will follow up closely with her primary care provider. I discussed results, treatment plan,  need for follow-up, and return precautions with the patient. Provided opportunity for  questions, patient confirmed understanding and is in agreement with plan.   Portions of this note were generated with [employer]. Dictation errors may occur despite best attempts at proofreading.  Final Clinical Impression(s) / ED Diagnoses Final diagnoses:  Hypertension, unspecified type  Chest pain, unspecified type  Diarrhea, unspecified type    Rx / DC Orders ED Discharge Orders    None       Amaryllis Dyke, PA-C 06/20/20 0354    Gareth Morgan, MD 06/21/20 2332

## 2020-06-19 NOTE — ED Triage Notes (Addendum)
Pt reports that she was  having chest pain and it has now resolved on the left side, BP was 184/94 at home. Reports nausea and diarrhea. Denies SOB.

## 2020-06-20 ENCOUNTER — Encounter (HOSPITAL_COMMUNITY): Payer: Self-pay

## 2020-06-20 ENCOUNTER — Ambulatory Visit (HOSPITAL_COMMUNITY)
Admission: EM | Admit: 2020-06-20 | Discharge: 2020-06-20 | Disposition: A | Discharge status: Home / Self Care | Payer: Medicare Other | Attending: Emergency Medicine | Admitting: Emergency Medicine

## 2020-06-20 ENCOUNTER — Other Ambulatory Visit: Payer: Self-pay

## 2020-06-20 ENCOUNTER — Emergency Department (HOSPITAL_COMMUNITY)
Admission: EM | Admit: 2020-06-20 | Discharge: 2020-06-20 | Disposition: A | Discharge status: Home / Self Care | Payer: Medicare Other | Source: Home / Self Care

## 2020-06-20 ENCOUNTER — Emergency Department (HOSPITAL_COMMUNITY): Payer: Medicare Other

## 2020-06-20 DIAGNOSIS — R519 Headache, unspecified: Secondary | ICD-10-CM | POA: Diagnosis not present

## 2020-06-20 DIAGNOSIS — R079 Chest pain, unspecified: Secondary | ICD-10-CM | POA: Insufficient documentation

## 2020-06-20 DIAGNOSIS — R110 Nausea: Secondary | ICD-10-CM

## 2020-06-20 DIAGNOSIS — I10 Essential (primary) hypertension: Secondary | ICD-10-CM

## 2020-06-20 DIAGNOSIS — Z5321 Procedure and treatment not carried out due to patient leaving prior to being seen by health care provider: Secondary | ICD-10-CM | POA: Insufficient documentation

## 2020-06-20 DIAGNOSIS — M25522 Pain in left elbow: Secondary | ICD-10-CM

## 2020-06-20 LAB — CBC
HCT: 38.2 % (ref 36.0–46.0)
Hemoglobin: 12.6 g/dL (ref 12.0–15.0)
MCH: 32.3 pg (ref 26.0–34.0)
MCHC: 33 g/dL (ref 30.0–36.0)
MCV: 97.9 fL (ref 80.0–100.0)
Platelets: 292 10*3/uL (ref 150–400)
RBC: 3.9 MIL/uL (ref 3.87–5.11)
RDW: 13.8 % (ref 11.5–15.5)
WBC: 11.3 10*3/uL — ABNORMAL HIGH (ref 4.0–10.5)
nRBC: 0 % (ref 0.0–0.2)

## 2020-06-20 LAB — BASIC METABOLIC PANEL
Anion gap: 7 (ref 5–15)
BUN: 12 mg/dL (ref 8–23)
CO2: 27 mmol/L (ref 22–32)
Calcium: 9 mg/dL (ref 8.9–10.3)
Chloride: 106 mmol/L (ref 98–111)
Creatinine, Ser: 1.04 mg/dL — ABNORMAL HIGH (ref 0.44–1.00)
GFR, Estimated: 58 mL/min — ABNORMAL LOW (ref >60)
Glucose, Bld: 126 mg/dL — ABNORMAL HIGH (ref 70–99)
Potassium: 3.7 mmol/L (ref 3.5–5.1)
Sodium: 140 mmol/L (ref 135–145)

## 2020-06-20 LAB — TROPONIN I (HIGH SENSITIVITY)
Troponin I (High Sensitivity): 8 ng/L (ref <18)
Troponin I (High Sensitivity): 9 ng/L (ref <18)

## 2020-06-20 MED ORDER — IOHEXOL 300 MG/ML  SOLN
100.0000 mL | Freq: Once | INTRAMUSCULAR | Status: AC | PRN
  Administered 2020-06-20: 100 mL via INTRAVENOUS

## 2020-06-20 NOTE — ED Triage Notes (Signed)
Pt presents with headache, chest pain, nausea, "feeling bloated" x 2 days; left elbow pain x Pt reports she was seeing at the ED yesterday for the same chief complaints and is here today as her blood pressure was high when she checked at home today. Pt denies vision changes, dizziness

## 2020-06-20 NOTE — ED Notes (Signed)
Vitals called x4

## 2020-06-20 NOTE — ED Notes (Signed)
Vitals called x6 

## 2020-06-20 NOTE — ED Triage Notes (Signed)
Patient complains of intermittent chest pain to abdomen and chest x 3 days. Sent from [facility] for further evaluation. Patient denies any pain on arrival. No vomiting, no nausea.

## 2020-06-20 NOTE — ED Provider Notes (Signed)
[facility]    CSN: [id number] Arrival date & time: 06/20/20  1600      History   Chief Complaint Chief Complaint  Patient presents with  . Chest Pain  . Headache  . Elbow Pain    HPI [name] is a 70 y.o. female.   Patient presents with chest pain x 2 days.  She also reports headache, nausea, feeling bloated, and left elbow pain.  No falls or injury.  She denies numbness, weakness, shortness of breath, dizziness, or other symptoms.  Patient was seen at [facility] ED yesterday; diagnosed with hypertension, chest pain, diarrhea; troponins negative; chest x-ray normal.  Her medical history includes hypertension, aortic atherosclerosis, abnormal EKG, heart murmur, aortic valve vegetation, clotting disorder, arthritis, renal infarct.  The history is provided by the patient and medical records.    Past Medical History:  Diagnosis Date  . Abnormal EKG 2012   hospitalized for T wave inversion in lateral leads with [facility] chest pains, normal ECHO, negative trops, no cardiology consult. recent EKG 7/15 stable T wave inversions.   . 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 Arthritis 2000  . Arthritis 2011  . Clotting disorder ([place])    blood clot kidney 2017  . Heart murmur   . Hyperlipidemia 2011  . Hypertension 2011  . Meniscus tear 2000   L KNEE  . 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 Left elbow pain 03/31/2020  . Contact dermatitis 01/14/2019  . Vaginal itching 12/05/2018  . Pelvic pain 10/07/2018  . Leg edema 08/28/2018  . Thickened endometrium   . Umbilical hernia without obstruction and without gangrene 01/01/2018  . Osteopenia 09/05/2017  . 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 Renal infarct ([place]) 09/30/2015  . HLD (hyperlipidemia)   . Aortic atherosclerosis ([place])   . Back pain 07/08/2015  . 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 Bilateral leg pain 06/14/2015  .  Seasonal allergies 03/09/2015  . Hypopigmentation 03/09/2015  . Osteoarthritis of left knee 07/08/2014  . DJD (degenerative joint disease), lumbar 07/08/2014  . Osteoarthritis of right knee 07/08/2014  . Urinary frequency 06/11/2014  . Healthcare maintenance 06/11/2014  . Allergic rhinoconjunctivitis of both eyes 06/11/2014  . Nearsightedness 04/27/2014  . Abnormal EKG   . Hypertension 12/11/2013    Past Surgical History:  Procedure Laterality Date  . ABDOMINAL SURGERY  2010   for bowel blockage  . COLON SURGERY  2010   bowel blockage  . COLONOSCOPY  09/2014  . HYSTEROSCOPY WITH D & C N/A 05/08/2018   Procedure: DILATATION AND CURETTAGE /HYSTEROSCOPY;  Surgeon: Mora Bellman, MD;  Location: [facility];  Service: Gynecology;  Laterality: N/A;  . KNEE ARTHROSCOPY Left 09/25/2013   Procedure: LEFT KNEE ARTHROSCOPY WITH PARTIAL MEDIAL AND LATERAL  MENISCECTOMY/DEBRIDEMENT/MICRO FRACTURE MEDIAL FEMORAL CONDYLE, REMOVAL OF OSTEOCONDRAL FRAGMENT;  Surgeon: Johnn Hai, MD;  Location: [facility];  Service: Orthopedics;  Laterality: Left;  . KNEE SURGERY Right 1999  . POLYPECTOMY    . TEE WITHOUT CARDIOVERSION N/A 10/04/2015   Procedure: TRANSESOPHAGEAL ECHOCARDIOGRAM (TEE);  Surgeon: Sueanne Margarita, MD;  Location: [facility] ENDOSCOPY;  Service: Cardiovascular;  Laterality: N/A;  . TUBAL LIGATION  1978    OB History    Gravida  2   Para  2   Term      Preterm      AB  Living  2     SAB      IAB      Ectopic      Multiple      Live Births               Home Medications    Prior to Admission medications   Medication Sig Start Date End Date Taking? Authorizing Provider  aspirin EC 81 MG tablet Take 1 tablet (81 mg total) daily by mouth. 01/31/17   Clent Demark, PA-C  atorvastatin (LIPITOR) 40 MG tablet Take 1 tablet (40 mg total) by mouth daily at 6 PM. 03/31/20   Gifford Shave, MD  carvedilol (COREG) 12.5 MG tablet TAKE 1 TABLET(12.5 MG) BY  MOUTH TWICE DAILY WITH A MEAL Patient taking differently: Take 12.5 mg by mouth 2 (two) times daily with a meal.  08/08/19   Rumball, Jake Church, DO  fluticasone (FLONASE) 50 MCG/ACT nasal spray SHAKE LIQUID AND USE 1 SPRAY IN EACH NOSTRIL EVERY DAY 04/20/20   Ouida Sills, Chelsey L, DO  hydrochlorothiazide (HYDRODIURIL) 25 MG tablet TAKE 1 TABLET BY MOUTH DAILY Patient taking differently: Take 25 mg by mouth daily.  09/21/19   Caroline More, DO  losartan (COZAAR) 25 MG tablet TAKE 1 TABLET BY MOUTH EVERY NIGHT AT BEDTIME Patient taking differently: Take 25 mg by mouth at bedtime.  06/26/19   Caroline More, DO  Multiple Vitamin (MULITIVITAMIN WITH MINERALS) TABS Take 1 tablet by mouth daily.    [provider]  potassium chloride SA (KLOR-CON) 20 MEQ tablet Take 1 tablet (20 mEq total) by mouth daily. 06/10/20   Gifford Shave, MD  traMADol (ULTRAM) 50 MG tablet Take 50 mg by mouth every 6 (six) hours as needed for pain. 09/08/19   [provider]    Family History Family History  Problem Relation Age of Onset  . Hypertension Mother   . Heart disease Mother        has a pacemaker   . Alzheimer's disease Mother   . Hyperlipidemia Mother   . Osteoporosis Mother   . Heart failure Father   . Hypertension Father   . Alzheimer's disease Father   . Hypertension Sister   . Breast cancer Sister   . Hypertension Sister   . Gout Sister   . Alcohol abuse Son   . Pancreatitis Son   . Cancer Neg Hx   . Colon cancer Neg Hx   . Esophageal cancer Neg Hx   . Stomach cancer Neg Hx   . Rectal cancer Neg Hx     Social History Social History   Tobacco Use  . Smoking status: Former Smoker    Quit date: 07/29/2010    Years since quitting: 9.9  . Smokeless tobacco: Never Used  Vaping Use  . Vaping Use: Never used  Substance Use Topics  . Alcohol use: Never    Alcohol/week: 0.0 standard drinks  . Drug use: Never     Allergies   Other, Shrimp [shellfish allergy], Latex, and  Penicillins   Review of Systems Review of Systems  Constitutional: Negative for chills and fever.  HENT: Negative for ear pain and sore throat.   Eyes: Negative for pain and visual disturbance.  Respiratory: Negative for cough and shortness of breath.   Cardiovascular: Positive for chest pain. Negative for palpitations.  Gastrointestinal: Positive for nausea. Negative for abdominal pain and vomiting.  Genitourinary: Negative for dysuria and hematuria.  Musculoskeletal: Positive for arthralgias. Negative for back pain.  Skin: Negative for color change and rash.  Neurological: Positive for headaches. Negative for syncope, weakness and numbness.  All other systems reviewed and are negative.    Physical Exam Triage Vital Signs ED Triage Vitals  Enc Vitals Group     BP      Pulse      Resp      Temp      Temp src      SpO2      Weight      Height      Head Circumference      Peak Flow      Pain Score      Pain Loc      Pain Edu?      Excl. in [place]?    No data found.  Updated Vital Signs BP (!) 176/67 (BP Location: Left Arm)   Pulse 65   Temp 98.7 F (37.1 C) (Oral)   Resp 20   SpO2 96%   Visual Acuity Right Eye Distance:   Left Eye Distance:   Bilateral Distance:    Right Eye Near:   Left Eye Near:    Bilateral Near:     Physical Exam Vitals and nursing note reviewed.  Constitutional:      General: She is not in acute distress.    Appearance: She is well-developed. She is not ill-appearing.  HENT:     Head: Normocephalic and atraumatic.     Mouth/Throat:     Mouth: Mucous membranes are moist.  Eyes:     Conjunctiva/sclera: Conjunctivae normal.  Cardiovascular:     Rate and Rhythm: Normal rate and regular rhythm.     Heart sounds: Normal heart sounds.  Pulmonary:     Effort: Pulmonary effort is normal. No respiratory distress.     Breath sounds: Normal breath sounds.  Abdominal:     General: Bowel sounds are normal.     Palpations: Abdomen is soft.      Tenderness: There is no abdominal tenderness. There is no guarding or rebound.  Musculoskeletal:        General: No swelling, tenderness or deformity. Normal range of motion.     Cervical back: Neck supple.     Right lower leg: No edema.     Left lower leg: No edema.  Skin:    General: Skin is warm and dry.     Capillary Refill: Capillary refill takes less than 2 seconds.     Findings: No bruising, erythema, lesion or rash.  Neurological:     General: No focal deficit present.     Mental Status: She is alert and oriented to person, place, and time.     Sensory: No sensory deficit.     Motor: No weakness.     Gait: Gait normal.  Psychiatric:        Mood and Affect: Mood normal.        Behavior: Behavior normal.      UC Treatments / Results  Labs (all labs ordered are listed, but only abnormal results are displayed) Labs Reviewed - No data to display  EKG   Radiology DG Chest 2 View  Result Date: 06/19/2020 CLINICAL DATA:  Chest pain EXAM: CHEST - 2 VIEW COMPARISON:  10/04/2019 FINDINGS: Cardiac shadow is within normal limits. Mild aortic calcifications are seen. Lungs are well aerated bilaterally. Tiny calcified granuloma is noted in the right upper lobe. No focal infiltrate or effusion is seen. No bony abnormality is noted. IMPRESSION:  No acute abnormality noted. Electronically Signed   By: Inez Catalina M.D.   On: 06/19/2020 21:53   CT ABDOMEN PELVIS W CONTRAST  Result Date: 06/20/2020 CLINICAL DATA:  Chooses suprapubic pain with elevated white blood cells nausea and diarrhea, diverticulitis suspected. EXAM: CT ABDOMEN AND PELVIS WITH CONTRAST TECHNIQUE: Multidetector CT imaging of the abdomen and pelvis was performed using the standard protocol following bolus administration of intravenous contrast. CONTRAST:  165mL OMNIPAQUE IOHEXOL 300 MG/ML  SOLN COMPARISON:  CT abdomen pelvis October 05, 2019 and May 26, 2019 FINDINGS: Lower chest: No acute abnormality. Normal size  heart. No significant pericardial effusion/thickening. Coronary artery calcifications. Hepatobiliary: No suspicious hepatic lesion. Gallbladder is unremarkable. No biliary ductal dilation. Pancreas: Unremarkable. No pancreatic ductal dilatation or surrounding inflammatory changes. Spleen: Normal in size without focal abnormality. Adrenals/Urinary Tract: Adrenal glands are unremarkable. No hydronephrosis. Right upper pole renal scarring. No solid enhancing renal lesions. Punctate nonobstructive right nephrolithiasis. Urinary bladder is grossly unremarkable for degree of distension. Stomach/Bowel: Stomach is unremarkable for degree of distension. No suspicious small bowel wall thickening or dilation. Appendix and terminal ileum are unremarkable. Scattered colonic diverticulosis without findings of acute diverticulitis Vascular/Lymphatic: Aortic atherosclerosis. No enlarged abdominal or pelvic lymph nodes. Reproductive: Calcified uterine leiomyomas.  No adnexal lesion. Other: Fat containing left inguinal hernia. Musculoskeletal: Lumbar spondylosis.  No acute osseous abnormality. IMPRESSION: 1. No acute abnormality in the abdomen or pelvis. 2. Scattered colonic diverticulosis without findings of acute diverticulitis. 3. Fat containing left inguinal hernia. 4. Aortic atherosclerosis. Aortic Atherosclerosis (ICD10-I70.0). Electronically Signed   By: Dahlia Bailiff MD   On: 06/20/2020 00:38    Procedures Procedures (including critical care time)  Medications Ordered in UC Medications - No data to display  Initial Impression / Assessment and Plan / UC Course  I have reviewed the triage vital signs and the nursing notes.  Pertinent labs & imaging results that were available during my care of the patient were reviewed by me and considered in my medical decision making (see chart for details).   Chest pain, elevated blood pressure reading with known hypertension, acute headache, nausea, left elbow pain.  EKG  shows sinus bradycardia, rate 57, inverted T waves in leads I, II, aVL, V3, V4, V5, V6; no ST elevation; unchanged from previous EKG yesterday.  Sending patient to the ED for evaluation of her chest pain, elevated blood pressure, and other symptoms.  She declines EMS.  She states she drove herself here and feels stable to drive herself to the ED.   Final Clinical Impressions(s) / UC Diagnoses   Final diagnoses:  Chest pain, unspecified type  Elevated blood pressure reading in office with diagnosis of hypertension  Acute nonintractable headache, unspecified headache type  Nausea  Left elbow pain     Discharge Instructions     Go to the emergency department for evaluation of your chest pain, elevated blood pressure, and other symptoms.    ED Prescriptions    None     PDMP not reviewed this encounter.   Sharion Balloon, NP 06/20/20 [phone]

## 2020-06-20 NOTE — ED Notes (Signed)
Vitals called x2

## 2020-06-20 NOTE — Discharge Instructions (Addendum)
Go to the emergency department for evaluation of your chest pain, elevated blood pressure, and other symptoms.

## 2020-06-20 NOTE — Discharge Instructions (Addendum)
You were seen in the emergency department today for chest pain. Your work-up in the emergency department has been overall reassuring. Your labs have been fairly normal and or similar to previous blood work you have had done. Your EKG and the enzyme we use to check your heart did not show an acute heart attack at this time. Your chest x-ray was normal.  Your CT scan did not show any significant new problems, you do have diverticulosis as well as a hernia in your groin and some plaque in your main blood vessel in your belly, these are each things discussed with your primary care provider in follow-up.  Please follow attached diet guidelines to help with diarrhea.  Please continue to monitor your blood pressure at home.  Please call your primary care provider first day Monday morning for close follow-up and to discuss further management.    We would like you to follow up closely with your primary care provider and/or the cardiologist provided in your discharge instructions within 1-3 days. Return to the ER immediately should you experience any new or worsening symptoms including but not limited to return of pain, worsened pain, vomiting, shortness of breath, dizziness, lightheadedness, passing out, or any other concerns that you may have.

## 2020-06-30 ENCOUNTER — Other Ambulatory Visit: Payer: Self-pay

## 2020-06-30 ENCOUNTER — Ambulatory Visit (INDEPENDENT_AMBULATORY_CARE_PROVIDER_SITE_OTHER): Payer: Medicare Other | Admitting: Family Medicine

## 2020-06-30 VITALS — BP 132/80 | HR 79 | Ht 65.0 in | Wt 188.2 lb

## 2020-06-30 DIAGNOSIS — I10 Essential (primary) hypertension: Secondary | ICD-10-CM | POA: Diagnosis not present

## 2020-06-30 DIAGNOSIS — M25522 Pain in left elbow: Secondary | ICD-10-CM | POA: Diagnosis not present

## 2020-06-30 DIAGNOSIS — E785 Hyperlipidemia, unspecified: Secondary | ICD-10-CM | POA: Diagnosis not present

## 2020-06-30 MED ORDER — DICLOFENAC SODIUM 1 % EX GEL: 4.0000 g | Freq: Four times a day (QID) | CUTANEOUS | 1 refills | Status: DC

## 2020-06-30 NOTE — Patient Instructions (Addendum)
It was a pleasure to see you today!  1. For your elbow pain: please go get the x-ray of your left elbow. You can do this at the [facility] on [address], they are open from 8 AM to 5 PM, you can do this at your convenience. After I review the x-rays, I can tell you next steps. In the meantime, I recommend using ice for pain as well as rest. You can try using biofreeze or voltaren gel for pain as well. If your pain gets worse, I recommend following up with the orthopedist who saw you in January.  [facility]  [address]  [place], [place] [address]  [phone]   2. Please follow up with your primary care provider in the next month or so. I will get some labs today to evaluate your cholesterol levels (or fat in the blood). Please continue to take your medications.   3. For your blood pressure: please take your pressure at home around the same time every day when you are most relaxed. Be sure to keep both feet flat on the floor. Write down your blood pressure measurements every day for 2 weeks, then follow up with your primary care provider. This is the most accurate measurement of your blood pressure and will help with management.   Be Well,  Dr. Chauncey Reading

## 2020-06-30 NOTE — Progress Notes (Signed)
SUBJECTIVE:   CHIEF COMPLAINT / HPI:  Left elbow pain  Left elbow pain: patient presents today with left elbow pain. She reports that she hit her arm on her BP machine on Sunday, on the olecranon process. However, on chart review, patient has a long history of left elbow pain. She was seen by orthopedics at [facility] in February 2022 and diagnosed with triceps tendinitis and referred for PT. Patient then saw PCP in mid March 2022 and recommended to get x-ray of left elbow- which was not done. Patient has bruising on left elbow today, not on blood thinners, only on aspirin. Pain is aching, intermittent. She has tried acetaminophen without improvement.   HTN: Patient reports that she had elevated BP recently, up to 200/160s and went to [facility] and ED on 3/26-3/27. She is currently taking amlodipine 10 mg and HCTZ 25 mg. Today her BP is 132/80, she has been compliant with meds, presently asymptomatic.  HLD: At ED visit on 3/27 patient had CT a/p that showed aortic atherosclerosis. Patient asks about what this means and is worried as she has never heard of this before. Patient has had aortic atherosclerosis incidentally noted on CTs before. She has known HLD, is on atorvastatin 40 mg and is compliant with meds. She has not had a lipid panel in 4 years. Patient is a former smoker, offered to get lipid panel today to evaluate risk, which she requests.  PERTINENT  PMH / PSH: HTN, HLD, CAD  OBJECTIVE:   BP 132/80   Pulse 79   Ht 5\' 5"  (1.651 m)   Wt 188 lb 3.2 oz (85.4 kg)   SpO2 97%   BMI 31.32 kg/m   Nursing note and vitals reviewed GEN: age-appropriate AAW, resting comfortably in chair, NAD, WNWD HEENT: NCAT. PERRLA. Sclera without injection or icterus. MMM.  Cardiac: Regular rate and rhythm. Normal S1/S2. No murmurs, rubs, or gallops appreciated. 2+ radial pulses. Lungs: Clear bilaterally to ascultation. No increased WOB, no accessory muscle usage. No w/r/r. Left elbow: TTP at olecranon  process. Inspection yields no evidence of bony deformity, effusion, erythema, or rash. She has ecchymosis proximal dorsal forearm near the elbow. Active and passive ROM intact in flexion/extension/supination/pronation. Strength 5/5 throughout. No TTP at the medial or lateral epicondyle. No pain with finger/wrist extension against resistance. No pain with gripping or finger/wrist flexion against resistance. No evidence of pain or laxity at the UCL. Neuro: Alert and at baseline Ext: no edema Psych: Pleasant and appropriate  ASSESSMENT/PLAN:   Left elbow pain Patient with recent trauma to left elbow. Recommend obtaining left elbow x-ray to r/o fx, order placed, instructions on how to obtain x-ray given. Exam reasurringly normal and intact. Recommend conservative therapy with ibuprofen/acetaminophen, recommend ice for pain and swelling, as well as relative rest, can try topicals for pain like biofreeze or voltaren gel. Recommend if patient does not improve within 1-2 weeks (also depending on imaging results), recommend f/u with orthopedics.  Hypertension BP is appropriate today, continue current regimen. Recommend patient take her BP at home every day for 2 weeks at the same time of day and record this. Then follow up with PCP with BP record for most accurate BP range. Gave her return precautions in case she has elevated BP and becomes symptomatic.   HLD (hyperlipidemia) Patient reports she is compliant with atorvastatin 40 mg. Counseled on lifestyle and dietary modifications. Will obtain lipid panel today so better able to calculate risk and recommend therapy based on  that data. Recommend patient f/u with PCP in 1-2 weeks.      Gladys Damme, MD [place]

## 2020-07-01 ENCOUNTER — Ambulatory Visit
Admission: RE | Admit: 2020-07-01 | Discharge: 2020-07-01 | Disposition: A | Discharge status: Home / Self Care | Payer: Medicare Other | Source: Ambulatory Visit | Attending: Family Medicine | Admitting: Family Medicine

## 2020-07-01 DIAGNOSIS — M25522 Pain in left elbow: Secondary | ICD-10-CM

## 2020-07-01 NOTE — Assessment & Plan Note (Signed)
Patient with recent trauma to left elbow. Recommend obtaining left elbow x-ray to r/o fx, order placed, instructions on how to obtain x-ray given. Exam reasurringly normal and intact. Recommend conservative therapy with ibuprofen/acetaminophen, recommend ice for pain and swelling, as well as relative rest, can try topicals for pain like biofreeze or voltaren gel. Recommend if patient does not improve within 1-2 weeks (also depending on imaging results), recommend f/u with orthopedics.

## 2020-07-01 NOTE — Assessment & Plan Note (Signed)
BP is appropriate today, continue current regimen. Recommend patient take her BP at home every day for 2 weeks at the same time of day and record this. Then follow up with PCP with BP record for most accurate BP range. Gave her return precautions in case she has elevated BP and becomes symptomatic.

## 2020-07-01 NOTE — Assessment & Plan Note (Signed)
Patient reports she is compliant with atorvastatin 40 mg. Counseled on lifestyle and dietary modifications. Will obtain lipid panel today so better able to calculate risk and recommend therapy based on that data. Recommend patient f/u with PCP in 1-2 weeks.

## 2020-07-05 ENCOUNTER — Other Ambulatory Visit: Payer: Self-pay

## 2020-07-05 ENCOUNTER — Telehealth: Payer: Self-pay | Admitting: Family Medicine

## 2020-07-05 MED ORDER — LOSARTAN POTASSIUM 25 MG PO TABS: 25.0000 mg | ORAL_TABLET | Freq: Every day | ORAL | 3 refills | Status: DC

## 2020-07-05 NOTE — Telephone Encounter (Signed)
Reviewed left elbow x-ray: no fracture. C/w bruise over dorsal forearm/elbow. I called patient to give results, left HIPPA compliant VM (could not leave results). If patient returns the call: I recommend she f/u with [facility] ortho (saw in February 2022) if continued pain.  Gladys Damme, MD [facility], PGY-2

## 2020-07-06 NOTE — Telephone Encounter (Signed)
Patient returned call to nurse line. Provided with results per Dr. Jerrol Banana note. Patient verbalizes understanding.   Talbot Grumbling, RN

## 2020-07-15 ENCOUNTER — Other Ambulatory Visit: Payer: Self-pay | Admitting: Family Medicine

## 2020-07-15 DIAGNOSIS — E876 Hypokalemia: Secondary | ICD-10-CM

## 2020-07-29 ENCOUNTER — Other Ambulatory Visit: Payer: Self-pay | Admitting: Student in an Organized Health Care Education/Training Program

## 2020-09-10 ENCOUNTER — Telehealth: Payer: Self-pay | Admitting: *Deleted

## 2020-09-10 NOTE — Telephone Encounter (Signed)
Patient left a message on the referral line asking to be send to [facility].  Will forward to MD to place a referral for cardiology if he sees fit.  Legacie Dillingham,CMA

## 2020-09-30 ENCOUNTER — Encounter (HOSPITAL_COMMUNITY): Payer: Self-pay

## 2020-09-30 ENCOUNTER — Other Ambulatory Visit: Payer: Self-pay

## 2020-09-30 ENCOUNTER — Ambulatory Visit (HOSPITAL_COMMUNITY)
Admission: EM | Admit: 2020-09-30 | Discharge: 2020-09-30 | Disposition: A | Discharge status: Home / Self Care | Payer: Medicare Other | Attending: Internal Medicine | Admitting: Internal Medicine

## 2020-09-30 DIAGNOSIS — K5900 Constipation, unspecified: Secondary | ICD-10-CM | POA: Diagnosis not present

## 2020-09-30 DIAGNOSIS — R140 Abdominal distension (gaseous): Secondary | ICD-10-CM | POA: Diagnosis present

## 2020-09-30 DIAGNOSIS — R1084 Generalized abdominal pain: Secondary | ICD-10-CM

## 2020-09-30 DIAGNOSIS — I10 Essential (primary) hypertension: Secondary | ICD-10-CM | POA: Diagnosis not present

## 2020-09-30 DIAGNOSIS — Z8719 Personal history of other diseases of the digestive system: Secondary | ICD-10-CM

## 2020-09-30 LAB — POCT URINALYSIS DIPSTICK, ED / UC
Bilirubin Urine: NEGATIVE
Glucose, UA: NEGATIVE mg/dL
Hgb urine dipstick: NEGATIVE
Ketones, ur: NEGATIVE mg/dL
Leukocytes,Ua: NEGATIVE
Nitrite: NEGATIVE
Protein, ur: NEGATIVE mg/dL
Specific Gravity, Urine: 1.02 (ref 1.005–1.030)
Urobilinogen, UA: 0.2 mg/dL (ref 0.0–1.0)
pH: 5.5 (ref 5.0–8.0)

## 2020-09-30 NOTE — Discharge Instructions (Addendum)
-  Drink plenty of fluids and eat lots of leafy green vegetables -Follow-up with your primary care at their earliest convenience.  Call them tomorrow to schedule this. -If symptoms get worse instead of better, and you are not able to follow-up with your primary care, head straight to the emergency department.  They will need to do imaging that we are unable to do in the urgent care setting. -Please check your blood pressure at home or at the pharmacy. If this continues to be >140/90, follow-up with your primary care provider for further blood pressure management/ medication titration. If you develop chest pain, shortness of breath, vision changes, the worst headache of your life- head straight to the ED or call 911.

## 2020-09-30 NOTE — ED Triage Notes (Signed)
36 yr African American Woman present today with complaints of lower left side pain, bloated, vaginal itching since Monday. Patient also states bilateral edema in her ankles since Monday.   Patient states she has tried OTC AZO, cranberry juice and Tylenol ES 500 mg with no relief. Patient states she does have a Hx UTI.

## 2020-09-30 NOTE — ED Provider Notes (Signed)
[place]    CSN: [id number] Arrival date & time: 09/30/20  1641      History   Chief Complaint Chief Complaint  Patient presents with   Abdominal Pain    Left side     HPI Kailynn Satterly is a 70 y.o. female presenting abdominal pain and bloating.  Medical history aortic atherosclerosis, renal infarct, umbilical hernia, pelvic pain, vaginal atrophy, bowel obstruction.  Endorses 3 days of generalized abdominal bloating and pain.  She denies urinary symptoms including frequency, urgency, though there is some dysuria that I suspect is related to vaginal atrophy. She is passing daily bowel movements, states she had a bowel movement this morning and it was normal, she is stil passing gas. Actually had 3 loose bowel movements 1 day ago. Denies black or tarry stool. Denies n/v/d.  She mentions vaginal itching, but states this has been present for over 1 year, she is followed by primary care for this.  Denies new vaginal discharge.  She notes incidental swelling in her feet for few days.  Denies chest pain, shortness of breath, dizziness, weakness, headaches, vision changes.  For elevated blood pressure, she denies chest pain, dizziness, shortness of breath, weakness, headaches, vision changes.  HPI  Past Medical History:  Diagnosis Date   Abnormal EKG 2012   hospitalized for T wave inversion in lateral leads with MSK chest pains, normal ECHO, negative trops, no cardiology consult. recent EKG 7/15 stable T wave inversions.    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   blood clot in the kidney    Patient Active Problem List   Diagnosis Date Noted   Left elbow pain 03/31/2020   Contact dermatitis 01/14/2019   Vaginal itching 12/05/2018   Pelvic pain 10/07/2018   Leg edema  08/28/2018   Thickened endometrium    Umbilical hernia without obstruction and without gangrene 01/01/2018   Osteopenia 09/05/2017   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   Renal infarct ([place]) 09/30/2015   HLD (hyperlipidemia)    Aortic atherosclerosis ([facility])    Back pain 07/08/2015   Scotoma of blind spot area in visual field 06/14/2015   Bilateral leg pain 06/14/2015   Seasonal allergies 03/09/2015   Hypopigmentation 03/09/2015   Osteoarthritis of left knee 07/08/2014   DJD (degenerative joint disease), lumbar 07/08/2014   Osteoarthritis of right knee 07/08/2014   Urinary frequency 06/11/2014   Healthcare maintenance 06/11/2014   Allergic rhinoconjunctivitis of both eyes 06/11/2014   Nearsightedness 04/27/2014   Abnormal EKG    Hypertension 12/11/2013    Past Surgical History:  Procedure Laterality Date   ABDOMINAL SURGERY  2010   for bowel blockage   COLON SURGERY  2010   bowel blockage   COLONOSCOPY  09/2014   HYSTEROSCOPY WITH D & C N/A 05/08/2018   Procedure: DILATATION AND CURETTAGE /HYSTEROSCOPY;  Surgeon: Mora Bellman, MD;  Location: [facility];  Service: Gynecology;  Laterality: N/A;   KNEE ARTHROSCOPY Left 09/25/2013   Procedure: LEFT KNEE ARTHROSCOPY WITH PARTIAL MEDIAL AND LATERAL  MENISCECTOMY/DEBRIDEMENT/MICRO FRACTURE MEDIAL FEMORAL CONDYLE, REMOVAL OF OSTEOCONDRAL FRAGMENT;  Surgeon: Johnn Hai, MD;  Location: [facility];  Service: Orthopedics;  Laterality: Left;   KNEE SURGERY Right 1999   POLYPECTOMY  TEE WITHOUT CARDIOVERSION N/A 10/04/2015   Procedure: TRANSESOPHAGEAL ECHOCARDIOGRAM (TEE);  Surgeon: Sueanne Margarita, MD;  Location[facility] END[facility];  Service: Cardiovascular;  Laterality: N/A;   TUBAL LIGATION  1978    OB History     Gravida  2   Para  2   Term      Preterm      AB      Living  2      SAB      IAB      Ectopic      Multiple      Live Births               Home Medications    Prior to Admission  medications   Medication Sig Start Date End Date Taking? Authorizing Provider  aspirin EC 81 MG tablet Take 1 tablet (81 mg total) daily by mouth. 01/31/17  Yes Clent Demark, PA-C  atorvastatin (LIPITOR) 40 MG tablet Take 1 tablet (40 mg total) by mouth daily at 6 PM. 03/31/20  Yes Gifford Shave, MD  carvedilol (COREG) 12.5 MG tablet TAKE 1 TABLET(12.5 MG) BY MOUTH TWICE DAILY WITH A MEAL Patient taking differently: Take 12.5 mg by mouth 2 (two) times daily with a meal. 08/08/19  Yes Rumball, Jake Church, DO  diclofenac Sodium (VOLTAREN) 1 % GEL Apply 4 g topically 4 (four) times daily. 06/30/20  Yes Gladys Damme, MD  fluticasone [facility]) 50 MCG/ACT nasal spray SHAKE LIQUID AND USE 1 SPRAY IN EACH NOSTRIL EVERY DAY 07/29/20  Yes Gifford Shave, MD  hydrochlorothiazide (HYDRODIURIL) 25 MG tablet TAKE 1 TABLET BY MOUTH DAILY Patient taking differently: Take 25 mg by mouth daily. 09/21/19  Yes Tammi Klippel, Sherin, DO  losartan (COZAAR) 25 MG tablet Take 1 tablet (25 mg total) by mouth at bedtime. 07/05/20  Yes Gifford Shave, MD  Multiple Vitamin (MULITIVITAMIN WITH MINERALS) TABS Take 1 tablet by mouth daily.   Yes [provider]  potassium chloride SA (KLOR-CON) 20 MEQ tablet TAKE 1 TABLET(20 MEQ) BY MOUTH DAILY 07/16/20  Yes Gifford Shave, MD  traMADol (ULTRAM) 50 MG tablet Take 50 mg by mouth every 6 (six) hours as needed for pain. 09/08/19   [provider]    Family History Family History  Problem Relation Age of Onset   Hypertension Mother    Heart disease Mother        has a pacemaker    Alzheimer's disease Mother    Hyperlipidemia Mother    Osteoporosis Mother    Heart failure Father    Hypertension Father    Alzheimer's disease Father    Hypertension Sister    Breast cancer Sister    Hypertension Sister    Gout Sister    Alcohol abuse Son    Pancreatitis Son    Cancer Neg Hx    Colon cancer Neg Hx    Esophageal cancer Neg Hx    Stomach cancer Neg Hx     Rectal cancer Neg Hx     Social History Social History   Tobacco Use   Smoking status: Former    Pack years: 0.00    Types: Cigarettes    Quit date: 07/29/2010    Years since quitting: 10.1   Smokeless tobacco: Never  Vaping Use   Vaping Use: Never used  Substance Use Topics   Alcohol use: Never    Alcohol/week: 0.0 standard drinks   Drug use: Never     Allergies   Other,  Shrimp [shellfish allergy], Latex, and Penicillins   Review of Systems Review of Systems  Constitutional:  Negative for appetite change, chills, diaphoresis, fever and unexpected weight change.  HENT:  Negative for congestion, ear pain, sinus pressure, sinus pain, sneezing, sore throat and trouble swallowing.   Respiratory:  Negative for cough, chest tightness and shortness of breath.   Cardiovascular:  Negative for chest pain.  Gastrointestinal:  Positive for abdominal pain. Negative for abdominal distention, anal bleeding, blood in stool, constipation, diarrhea, nausea, rectal pain and vomiting.  Genitourinary:  Negative for dysuria, flank pain, frequency and urgency.  Musculoskeletal:  Negative for back pain and myalgias.  Neurological:  Negative for dizziness, light-headedness and headaches.  All other systems reviewed and are negative.   Physical Exam Triage Vital Signs ED Triage Vitals  Enc Vitals Group     BP      Pulse      Resp      Temp      Temp src      SpO2      Weight      Height      Head Circumference      Peak Flow      Pain Score      Pain Loc      Pain Edu?      Excl. in [place]?    No data found.  Updated Vital Signs BP (!) 181/66 (BP Location: Right Arm)   Pulse 70   Temp 99 F (37.2 C) (Oral)   Resp 16   SpO2 100%   Visual Acuity Right Eye Distance:   Left Eye Distance:   Bilateral Distance:    Right Eye Near:   Left Eye Near:    Bilateral Near:     Physical Exam Vitals reviewed.  Constitutional:      General: She is not in acute distress.     Appearance: Normal appearance. She is not ill-appearing or diaphoretic.  HENT:     Head: Normocephalic and atraumatic.     Mouth/Throat:     Mouth: Mucous membranes are moist.     Comments: Moist mucous membranes Eyes:     Extraocular Movements: Extraocular movements intact.     Pupils: Pupils are equal, round, and reactive to light.  Cardiovascular:     Rate and Rhythm: Normal rate and regular rhythm.     Pulses:          Radial pulses are 2+ on the right side and 2+ on the left side.     Heart sounds: Normal heart sounds.     Comments: Scant edema in feet. No edema to ankles.  Pulmonary:     Effort: Pulmonary effort is normal.     Breath sounds: Normal breath sounds. No wheezing, rhonchi or rales.  Abdominal:     General: Bowel sounds are normal. There is no distension.     Palpations: Abdomen is soft. There is no mass.     Tenderness: There is generalized abdominal tenderness. There is no right CVA tenderness, left CVA tenderness, guarding or rebound. Negative signs include Murphy's sign, Rovsing's sign and McBurney's sign.     Hernia: A hernia is present. Hernia is present in the umbilical area.     Comments: Generalized abdominal tenderness to palpation.BS full throughout. No mass. Small umbilical hernia that is nontender and reducible. No CVAT. She is comfortable throughout exam.   Genitourinary:    Comments: deferred Musculoskeletal:     Right lower leg:  No edema.     Left lower leg: No edema.  Skin:    General: Skin is warm.     Capillary Refill: Capillary refill takes less than 2 seconds.     Comments: Good skin turgor  Neurological:     General: No focal deficit present.     Mental Status: She is alert and oriented to person, place, and time.  Psychiatric:        Mood and Affect: Mood normal.        Behavior: Behavior normal.        Thought Content: Thought content normal.        Judgment: Judgment normal.     UC Treatments / Results  Labs (all labs ordered are  listed, but only abnormal results are displayed) Labs Reviewed  URINE CULTURE  POCT URINALYSIS DIPSTICK, ED / UC    EKG   Radiology No results found.  Procedures Procedures (including critical care time)  Medications Ordered in UC Medications - No data to display  Initial Impression / Assessment and Plan / UC Course  I have reviewed the triage vital signs and the nursing notes.  Pertinent labs & imaging results that were available during my care of the patient were reviewed by me and considered in my medical decision making (see chart for details).     This patient is a very pleasant 70 y.o. year old female presenting with generalized abdominal discomfort.  Afebrile, nontachycardic.  There is generalized abdominal pain throughout, but overall reassuring exam.  She does have distant history of bowel obstruction, but she is passing regular bowel movements and bowel sounds are positive throughout.  Discussed that while I cannot rule out a bowel obstruction, I have a low suspicion of this based on today's presentation.  She understands that if her symptoms worsen, she needs to head straight to the emergency department.  I also advised her to follow-up with her primary care in 1 to 2 days.  For hypertension, continue current regimen. UA wnl, culture sent.   ED return precautions discussed. Patient verbalizes understanding and agreement.    Final Clinical Impressions(s) / UC Diagnoses   Final diagnoses:  Essential hypertension  Generalized abdominal pain  Abdominal bloating  Constipation, unspecified constipation type  History of small bowel obstruction     Discharge Instructions      -Drink plenty of fluids and eat lots of leafy green vegetables -Follow-up with your primary care at their earliest convenience.  Call them tomorrow to schedule this. -If symptoms get worse instead of better, and you are not able to follow-up with your primary care, head straight to the  emergency department.  They will need to do imaging that we are unable to do in the urgent care setting. -Please check your blood pressure at home or at the pharmacy. If this continues to be >140/90, follow-up with your primary care provider for further blood pressure management/ medication titration. If you develop chest pain, shortness of breath, vision changes, the worst headache of your life- head straight to the ED or call 911.       ED Prescriptions   None    PDMP not reviewed this encounter.   Hazel Sams, PA-C 09/30/20 1843

## 2020-10-02 LAB — URINE CULTURE

## 2020-10-14 ENCOUNTER — Other Ambulatory Visit: Payer: Self-pay

## 2020-10-14 ENCOUNTER — Ambulatory Visit (INDEPENDENT_AMBULATORY_CARE_PROVIDER_SITE_OTHER): Payer: Medicare Other | Admitting: Family Medicine

## 2020-10-14 VITALS — BP 178/73 | HR 66 | Ht 65.0 in | Wt 197.8 lb

## 2020-10-14 DIAGNOSIS — M25471 Effusion, right ankle: Secondary | ICD-10-CM

## 2020-10-14 DIAGNOSIS — M545 Low back pain, unspecified: Secondary | ICD-10-CM | POA: Diagnosis not present

## 2020-10-14 DIAGNOSIS — E785 Hyperlipidemia, unspecified: Secondary | ICD-10-CM

## 2020-10-14 DIAGNOSIS — M25472 Effusion, left ankle: Secondary | ICD-10-CM | POA: Diagnosis not present

## 2020-10-14 NOTE — Patient Instructions (Addendum)
It was great to meet you today!  Here's what we talked about:  Your wheezing has resolved and you are not experiencing any shortness of breath or chest pain. We suspect this is due to your allergies. Continue taking your Flonase and drinking plenty of water. The swelling in your feet is likely due to a condition called venous insufficiency, which is common as we get older and means that the fluid in our legs does not travel back up like it used to. You can use compression stockings to help this and also be sure to elevate your legs throughout the day. Your left lower side pain is likely caused by the arthritis and degenerative changes seen in your spine on the imaging you received before. Continue using heat pads, Voltaren gel, and Tylenol to help the pain. We will also refer you back to physical therapy. Please try to cut down on your garlic salt intake. This is likely contributing to your ankle swelling and your elevated blood pressure.   We are checking your cholesterol today, and we will call you or send you a message if the test is abnormal.   Take care and seek immediate care sooner if you develop any concerns.  Berneta Sages "Branden" Mabe, [place]

## 2020-10-14 NOTE — Progress Notes (Addendum)
SUBJECTIVE:   CHIEF COMPLAINT / HPI:   [name] (MRN: [id number]) is a 70 y.o. female with a history of HTN, HLD, unspecified clotting disorder, and renal infarct who presents for evaluation of wheezing, bilateral swollen ankles, and left lower side pain.  Wheezing Patient reports noticing some upper respiratory wheezing last week. She is not currently experiencing symptoms today. She endorses a history of allergies for which she takes Flonase. She does not have a history of asthma, though her son does. She denies any sick contacts, chest pain, or shortness of breath at rest.  Bilateral swollen ankles Her ankles have been swollen for the last month. She reports no pain or decreased movement of her ankles. She has not noticed any redness or warmth in the area. She endorses eating garlic salt every day with multiple meals. She is taking HCTZ and is compliant with the medication. She denies any shortness of breath when lying down.  Left lower back pain Patient endorses pain in her left lower back/side for 6 months to a year. The pain occurs more when she first wakes up in the morning and subsides throughout the day. She is unable to characterize the pain, but she feels it is deeper in her side. She has tried Tylenol, Voltaren gel, Biofreeze, and Bengay with minimal relief. She has also tried physical therapy twice. She had a MRI of the lumbar spine in 2021 that demonstrated lumbar multilevel disc and facet degeneration, mild stenosis of the left L1-2 and L2-3, moderate foraminal stenosis of left L3-4, and moderate foraminal stenosis of right L4-5 and L5-S1.  PERTINENT  PMH / PSH: HTN, HLD, unspecified clotting disorder, renal infarct; 10 cigarettes a day for 5 years smoking history, quit last year  OBJECTIVE:   Ht 5\' 5"  (1.651 m)   Wt 197 lb 12.8 oz (89.7 kg)   BMI 32.92 kg/m    PHYSICAL EXAM  GEN: Well-developed, well-nourished, in NAD CVS: RRR, normal S1/S2, no murmurs, rubs,  gallops; no pitting edema RESP: Breathing comfortably on RA, no retractions, wheezes, rhonchi, or crackles MSK: No tenderness to palpation over left iliac crest, left paraspinal muscles, or surrounding areas. No warmth. Gait and ambulation normal. Ankles bilaterally with trace edema with no pain, erythema, or warmth.  ASSESSMENT/PLAN:   Allergies Patient endorsed the wheezing had subsided today. She endorsed wheezing coming from URT instead of in the lungs. Lungs CTAB on exam. Given history of allergies and their prevalence at this time of year, allergies is highest on my differential. Though lower on my differential, if problem persists given her tobacco history, consider PFTs for diagnosis of COPD. -Recommend continuing Flonase daily and drinking plenty of water  Venous insufficiency Patient endorses swelling of her ankles for the last month. Edema is trace and non-pitting. She has no pain or reduced ROM. She mentions eating garlic salt with multiple meals daily. She has been taking her HCTZ as prescribed. Echo in 2021 demonstrated LVEF of 70-75%. UA on 09/30/20 was not concerning for nephrotic syndrome. Given constellation of symptoms, past tests, and her age, venous insufficiency is highest on the differential. Her increased salt intake is likely exacerbating the swelling. -Counseled on reducing salt intake -Discussed the normal nature of venous insufficiency in her age group and advised using compression stocking and raising her legs throughout the day  Spinal stenosis and degenerative changes Patient endorses similar pain for 6 months to a year. Past studies have shown degenerative changes of the lumbar spine. Suspect her  current presentation is due to these findings. She has tried multiple medications to ease the pain and physical therapy. She is amenable to trying physical therapy again, however, and continuing using her medications. -Counseled on chronic nature of degenerative spinal  changes -Recommended continuing Voltaren gel and Tylenol for pain -Referred to physical therapy  HLD Patient has not had lipid panel with our system since 2018. Last total cholesterol 206, HDL 66, LDL 110, TG 149, and VLDL 30. She has been taking Lipitor as prescribed since January 2022. -Ordered lipid panel to assess efficacy of Lipitor  Mikal Plane, Medical Student [place]    I was personally present and re-performed the exam and medical decision making and verified the service and findings are accurately documented in the student's note.  Alcus Dad, MD 10/15/2020 10:51 AM

## 2020-10-15 LAB — LIPID PANEL
Chol/HDL Ratio: 4 ratio (ref 0.0–4.4)
Cholesterol, Total: 208 mg/dL — ABNORMAL HIGH (ref 100–199)
HDL: 52 mg/dL (ref >39)
LDL Chol Calc (NIH): 109 mg/dL — ABNORMAL HIGH (ref 0–99)
Triglycerides: 275 mg/dL — ABNORMAL HIGH (ref 0–149)
VLDL Cholesterol Cal: 47 mg/dL — ABNORMAL HIGH (ref 5–40)

## 2020-11-18 ENCOUNTER — Ambulatory Visit: Payer: Medicare Other

## 2020-11-18 ENCOUNTER — Ambulatory Visit (INDEPENDENT_AMBULATORY_CARE_PROVIDER_SITE_OTHER): Payer: Medicare Other | Admitting: Cardiology

## 2020-11-18 ENCOUNTER — Telehealth: Payer: Self-pay

## 2020-11-18 ENCOUNTER — Other Ambulatory Visit: Payer: Self-pay

## 2020-11-18 VITALS — BP 174/84 | HR 75 | Ht 65.0 in | Wt 198.8 lb

## 2020-11-18 DIAGNOSIS — N280 Ischemia and infarction of kidney: Secondary | ICD-10-CM

## 2020-11-18 DIAGNOSIS — R011 Cardiac murmur, unspecified: Secondary | ICD-10-CM

## 2020-11-18 DIAGNOSIS — I10 Essential (primary) hypertension: Secondary | ICD-10-CM

## 2020-11-18 DIAGNOSIS — E785 Hyperlipidemia, unspecified: Secondary | ICD-10-CM

## 2020-11-18 DIAGNOSIS — R079 Chest pain, unspecified: Secondary | ICD-10-CM

## 2020-11-18 DIAGNOSIS — R400 Somnolence: Secondary | ICD-10-CM

## 2020-11-18 MED ORDER — LOSARTAN POTASSIUM 50 MG PO TABS
50.0000 mg | ORAL_TABLET | Freq: Every day | ORAL | 3 refills | Status: DC
Start: 2020-11-18 — End: 2020-12-29

## 2020-11-18 MED ORDER — METOPROLOL TARTRATE 100 MG PO TABS: ORAL_TABLET | ORAL | 0 refills | Status: DC

## 2020-11-18 NOTE — Progress Notes (Signed)
Cardiology Office Note:    Date:  11/18/2020   ID:  [name], DOB 10-12-1950, MRN [id number]  PCP:  Gifford Shave, MD  Cardiologist:  None  Electrophysiologist:  None   Referring MD: Zenia Resides, MD   Chief Complaint  Patient presents with   Chest Pain    History of Present Illness:    [name] is a 70 y.o. female with a hx of hypertension, hyperlipidemia, renal infarct, ?aortic valve vegetation who is referred by Dr. Andria Frames for evaluation of chest pain.  She was admitted to [facility] from 7/6 through 10/04/2015 with lower abdominal pain.  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  She was started on heparin drip.  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  Blood cultures were negative.  She was started on anticoagulation with warfarin.  Echo 10/01/2015 showed EF 65 to [id number], grade 1 diastolic dysfunction.  She completed 6 months of anticoagulation and was then transitioned to aspirin 81 mg daily.  No further episodes.  She reports she started having chest pain in April.  Describes lower left-sided chest pain that feels sharp.  Typically last for about 5 minutes.  Has not noticed with exertion but does notice it can occur with stress.  She walks 2 days/week for 30 minutes.  She denies any dyspnea, headedness, syncope, or palpitations.  Does report intermittent ankle swelling.  Reports does not check BP regularly at home, was 149/80 when last checked.  She quit smoking 10 years ago (smoked 0.5 packs/day for 10 years).  Family history includes mother had pacemaker, she is unsure of details.   Past Medical History:  Diagnosis Date   Abnormal EKG 2012   hospitalized for T wave inversion in lateral leads with MSK chest pains, normal ECHO, negative trops, no cardiology consult. recent EKG 7/15 stable T wave inversions.    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   blood clot in the kidney    Past Surgical History:  Procedure Laterality Date   ABDOMINAL SURGERY  2010   for bowel blockage   COLON SURGERY  2010   bowel blockage   COLONOSCOPY  09/2014   HYSTEROSCOPY WITH D & C N/A 05/08/2018   Procedure: DILATATION AND CURETTAGE /HYSTEROSCOPY;  Surgeon: Mora Bellman, MD;  Location: [facility];  Service: Gynecology;  Laterality: N/A;   KNEE ARTHROSCOPY Left 09/25/2013   Procedure: LEFT KNEE ARTHROSCOPY WITH PARTIAL MEDIAL AND LATERAL  MENISCECTOMY/DEBRIDEMENT/MICRO FRACTURE MEDIAL FEMORAL CONDYLE, REMOVAL OF OSTEOCONDRAL FRAGMENT;  Surgeon: Johnn Hai, MD;  Location: [facility];  Service: Orthopedics;  Laterality: Left;   KNEE SURGERY Right 1999   POLYPECTOMY     TEE WITHOUT CARDIOVERSION N/A 10/04/2015   Procedure: TRANSESOPHAGEAL ECHOCARDIOGRAM (TEE);  Surgeon: Sueanne Margarita, MD;  Location: [facility];  Service: Cardiovascular;  Laterality: N/A;   TUBAL LIGATION  1978    Current Medications: Current Meds  Medication Sig   aspirin EC 81 MG tablet Take 1 tablet (81 mg total) daily by mouth.   atorvastatin (LIPITOR) 40 MG tablet Take 1 tablet (40 mg total) by mouth daily at 6 PM.   carvedilol (COREG)  12.5 MG tablet TAKE 1 TABLET(12.5 MG) BY MOUTH TWICE DAILY WITH A MEAL (Patient taking differently: Take 12.5 mg by mouth 2 (two) times daily with a meal.)   fluticasone (FLONASE) 50 MCG/ACT nasal spray SHAKE LIQUID AND USE 1 SPRAY IN EACH NOSTRIL EVERY DAY   hydrochlorothiazide (HYDRODIURIL) 25 MG tablet TAKE 1 TABLET BY MOUTH DAILY (Patient taking differently: Take 25 mg by mouth daily.)   metoprolol tartrate (LOPRESSOR) 100 MG tablet Take 100 mg (1 tablet) two hours prior to CT scan   Multiple Vitamin (MULITIVITAMIN WITH MINERALS) TABS Take 1 tablet by  mouth daily.   [DISCONTINUED] losartan (COZAAR) 25 MG tablet Take 1 tablet (25 mg total) by mouth at bedtime.     Allergies:   Other, Shrimp [shellfish allergy], Latex, and Penicillins   Social History   Socioeconomic History   Marital status: Single    Spouse name: Not on file   Number of children: 2   Years of education: some colle   Highest education level: Not on file  Occupational History   Occupation: Custodian     Employer: [employer]  Tobacco Use   Smoking status: Former    Types: Cigarettes    Quit date: 07/29/2010    Years since quitting: 10.3   Smokeless tobacco: Never  Vaping Use   Vaping Use: Never used  Substance and Sexual Activity   Alcohol use: Never    Alcohol/week: 0.0 standard drinks   Drug use: Never   Sexual activity: Not Currently    Birth control/protection: Surgical    Comment: 1st intercourse 70 yo-Fewer than 5 partners  Other Topics Concern   Not on file  Social History Narrative   Live with mom and son (14).   Social Determinants of Health   Financial Resource Strain: Not on file  Food Insecurity: Not on file  Transportation Needs: Not on file  Physical Activity: Not on file  Stress: Not on file  Social Connections: Not on file     Family History: The patient's family history includes Alcohol abuse in her son; Alzheimer's disease in her father and mother; Breast cancer in her sister; Gout in her sister; Heart disease in her mother; Heart failure in her father; Hyperlipidemia in her mother; Hypertension in her father, mother, sister, and sister; Osteoporosis in her mother; Pancreatitis in her son. There is no history of Cancer, Colon cancer, Esophageal cancer, Stomach cancer, or Rectal cancer.  ROS:   Please see the history of present illness.     All other systems reviewed and are negative.  EKGs/Labs/Other Studies Reviewed:    The following studies were reviewed today:   EKG:  EKG is  ordered today.  The ekg ordered  today demonstrates normal sinus rhythm, rate 75, LVH with repolarization changes  Recent Labs: 06/19/2020: ALT 20 06/20/2020: BUN 12; Creatinine, Ser 1.04; Hemoglobin 12.6; Platelets 292; Potassium 3.7; Sodium 140  Recent Lipid Panel    Component Value Date/Time   CHOL 208 (H) 10/14/2020 1513   TRIG 275 (H) 10/14/2020 1513   HDL 52 10/14/2020 1513   CHOLHDL 4.0 10/14/2020 1513   CHOLHDL 4.7 10/01/2015 0732   VLDL 27 10/01/2015 0732   LDLCALC 109 (H) 10/14/2020 1513    Physical Exam:    VS:  BP (!) 174/84   Pulse 75   Ht '5\' 5"'$  (1.651 m)   Wt 198 lb 12.8 oz (90.2 kg)   SpO2 99%   BMI 33.08 kg/m  Wt Readings from Last 3 Encounters:  11/18/20 198 lb 12.8 oz (90.2 kg)  10/14/20 197 lb 12.8 oz (89.7 kg)  06/30/20 188 lb 3.2 oz (85.4 kg)     GEN:  Well nourished, well developed in no acute distress HEENT: Normal NECK: No JVD; No carotid bruits LYMPHATICS: No lymphadenopathy CARDIAC: [id number] systolic murmur RESPIRATORY:  Clear to auscultation without rales, wheezing or rhonchi  ABDOMEN: Soft, non-tender, non-distended MUSCULOSKELETAL:  Trace edema; No deformity  SKIN: Warm and dry NEUROLOGIC:  Alert and oriented x 3 PSYCHIATRIC:  Normal affect   ASSESSMENT:    1. Chest pain, unspecified type   2. Heart murmur   3. Renal infarct ([place])   4. Essential hypertension   5. Hyperlipidemia, unspecified hyperlipidemia type   6. Daytime somnolence    PLAN:    Chest pain: Atypical in description, no clear relationship with exertion but does appear to be correlated to stress.  Does have significant CAD risk factors (age, hypertension, hyperlipidemia, former tobacco use). -Recommend coronary CTA to evaluate for obstructive CAD.  We will hold home carvedilol and give Lopressor 100 mg prior to study  Heart murmur: 2 out of 6 systolic murmur on exam, will check echocardiogram  Renal infarct: Presented with renal infarct in 2017.  TEE showed possible aortic valve vegetation versus  Lambls excrescence.  Blood cultures negative.  Completed 6 months of warfarin.  Has not had recurrent evidence of embolic events -Check echocardiogram -Zio patch x2 weeks to evaluate for A. Fib  Hypertension: On carvedilol 12.5 mg twice daily, HCTZ 25 mg daily, losartan 25 mg daily.  BP elevated, will increase losartan to 50 mg daily.  Asked to check BP daily for next 2 weeks.  Will check BMP in 2 weeks.  Schedule follow-up in pharmacy hypertension clinic in 2 weeks for further medication titration  Hyperlipidemia: On atorvastatin 40 mg daily.  LDL 109 on 10/14/2020.  We will follow-up results of upcoming coronary CTA to evaluate how aggressive to be in lowering cholesterol.  Daytime somnolence: Check sleep study.  Epworth score 16  RTC in 3 months   Medication Adjustments/Labs and Tests Ordered: Current medicines are reviewed at length with the patient today.  Concerns regarding medicines are outlined above.  Orders Placed This Encounter  Procedures   CT CORONARY MORPH W/CTA COR W/SCORE W/CA W/CM &/OR WO/CM   Basic metabolic panel   LONG TERM MONITOR (3-14 DAYS)   EKG 12-Lead   ECHOCARDIOGRAM COMPLETE   Split night study    Meds ordered this encounter  Medications   losartan (COZAAR) 50 MG tablet    Sig: Take 1 tablet (50 mg total) by mouth at bedtime.    Dispense:  90 tablet    Refill:  3    Dose increase   metoprolol tartrate (LOPRESSOR) 100 MG tablet    Sig: Take 100 mg (1 tablet) two hours prior to CT scan    Dispense:  1 tablet    Refill:  0     Patient Instructions  Medication Instructions:  INCREASE losartan to 50 mg once daily  Please check your blood pressure at home daily, write it down.  Call the office or send message via Mychart with the readings in 2 weeks for Dr. Gardiner Rhyme to review.   *If you need a refill on your cardiac medications before your next appointment, please call your pharmacy*   Lab Work: Please return for labs in 2 weeks (BMET)-this can be  completed at your appointment  with the pharmacist   Our in office lab hours are Monday-Friday 8:00-4:00, closed for lunch 12:45-1:45 pm.  No appointment needed.  Testing/Procedures: Coronary CTA-see instructions below  Your physician has requested that you have an echocardiogram. Echocardiography is a painless test that uses sound waves to create images of your heart. It provides your doctor with information about the size and shape of your heart and how well your heart's chambers and valves are working. This procedure takes approximately one hour. There are no restrictions for this procedure.  Your physician has recommended that you have a sleep study. This test records several body functions during sleep, including: brain activity, eye movement, oxygen and carbon dioxide blood levels, heart rate and rhythm, breathing rate and rhythm, the flow of air through your mouth and nose, snoring, body muscle movements, and chest and belly movement.  ZIO XT- Long Term Monitor Instructions   Your physician has requested you wear a ZIO patch monitor for _14__ days.  This is a single patch monitor.   [employer] supplies one patch monitor per enrollment. Additional stickers are not available. Please do not apply patch if you will be having a Nuclear Stress Test, Echocardiogram, Cardiac CT, MRI, or Chest Xray during the period you would be wearing the monitor. The patch cannot be worn during these tests. You cannot remove and re-apply the ZIO XT patch monitor.  Your ZIO patch monitor will be sent Fed Ex from [employer] directly to your home address. It may take 3-5 days to receive your monitor after you have been enrolled.  Once you have received your monitor, please review the enclosed instructions. Your monitor has already been registered assigning a specific monitor serial # to you.  Billing and Patient Assistance Program Information   We have supplied [employer] with any of your insurance information  on file for billing purposes. [employer] offers a sliding scale Patient Assistance Program for patients that do not have insurance, or whose insurance does not completely cover the cost of the ZIO monitor.   You must apply for the Patient Assistance Program to qualify for this discounted rate.     To apply, please call [employer] at [phone], select option 4, then select option 2, and ask to apply for Patient Assistance Program.  Theodore Demark will ask your household income, and how many people are in your household.  They will quote your out-of-pocket cost based on that information.  [employer] will also be able to set up a 45-month interest-free payment plan if needed.  Applying the monitor   Shave hair from upper left chest.  Hold abrader disc by orange tab. Rub abrader in 40 strokes over the upper left chest as indicated in your monitor instructions.  Clean area with 4 enclosed alcohol pads. Let dry.  Apply patch as indicated in monitor instructions. Patch will be placed under collarbone on left side of chest with arrow pointing upward.  Rub patch adhesive wings for 2 minutes. Remove white label marked "1". Remove the white label marked "2". Rub patch adhesive wings for 2 additional minutes.  While looking in a mirror, press and release button in center of patch. A small green light will flash 3-4 times. This will be your only indicator that the monitor has been turned on. ?  Do not shower for the first 24 hours. You may shower after the first 24 hours.  Press the button if you feel a symptom. You will hear a small click. Record Date, Time and Symptom  in the Patient Logbook.  When you are ready to remove the patch, follow instructions on the last 2 pages of the Patient Logbook. Stick patch monitor onto the last page of Patient Logbook.  Place Patient Logbook in the blue and white box.  Use locking tab on box and tape box closed securely.  The blue and white box has prepaid postage on it. Please place it in  the mailbox as soon as possible. Your physician should have your test results approximately 7 days after the monitor has been mailed back to [facility].  Call [place] at [phone] if you have questions regarding your ZIO XT patch monitor. Call them immediately if you see an orange light blinking on your monitor.  If your monitor falls off in less than 4 days, contact our Monitor department at [phone]. ?If your monitor becomes loose or falls off after 4 days call [employer] at [phone] for suggestions on securing your monitor.?  Follow-Up: At [facility], you and your health needs are our priority.  As part of our continuing mission to provide you with exceptional heart care, we have created designated Provider Care Teams.  These Care Teams include your primary Cardiologist (physician) and Advanced Practice Providers (APPs -  Physician Assistants and Nurse Practitioners) who all work together to provide you with the care you need, when you need it.  We recommend signing up for the patient portal called "MyChart".  Sign up information is provided on this After Visit Summary.  MyChart is used to connect with patients for Virtual Visits (Telemedicine).  Patients are able to view lab/test results, encounter notes, upcoming appointments, etc.  Non-urgent messages can be sent to your provider as well.   To learn more about what you can do with MyChart, go to [email or web address].    Your next appointment:   Pharmacist in 2 weeks 3 months with Dr. Gardiner Rhyme   Other Instructions   Your cardiac CT will be scheduled at one of the below locations:   [facility] [address] [place], [place] [address] [phone]  If scheduled at [facility], please arrive at the [facility] main entrance (entrance A) of [facility] 30 minutes prior to test start time. Proceed to the [facility] Radiology Department (first floor) to check-in and  test prep.  Please follow these instructions carefully (unless otherwise directed):  ` On the Night Before the Test: Be sure to Drink plenty of water. Do not consume any caffeinated/decaffeinated beverages or chocolate 12 hours prior to your test. Do not take any antihistamines 12 hours prior to your test.  On the Day of the Test: Drink plenty of water until 1 hour prior to the test. Do not eat any food 4 hours prior to the test. You may take your regular medications prior to the test.  HOLD MORNING DOSE OF CARVEDILOL (COREG) Take metoprolol (Lopressor) two hours prior to test. HOLD Hydrochlorothiazide morning of the test. FEMALES- please wear underwire-free bra if available, avoid dresses & tight clothing     After the Test: Drink plenty of water. After receiving IV contrast, you may experience a mild flushed feeling. This is normal. On occasion, you may experience a mild rash up to 24 hours after the test. This is not dangerous. If this occurs, you can take Benadryl 25 mg and increase your fluid intake. If you experience trouble breathing, this can be serious. If it is severe call 911 IMMEDIATELY. If it is mild,  please call our office. If you take any of these medications: Glipizide/Metformin, Avandament, Glucavance, please do not take 48 hours after completing test unless otherwise instructed.  Please allow 2-4 weeks for scheduling of routine cardiac CTs. Some insurance companies require a pre-authorization which may delay scheduling of this test.   For non-scheduling related questions, please contact the cardiac imaging nurse navigator should you have any questions/concerns: Marchia Bond, Cardiac Imaging Nurse Navigator Gordy Clement, Cardiac Imaging Nurse Navigator [facility] Direct Office Dial: [phone]   For scheduling needs, including cancellations and rescheduling, please call [place], [phone].    Signed, Donato Heinz, MD   11/18/2020 5:48 PM    [place]

## 2020-11-18 NOTE — Progress Notes (Unsigned)
Enrolled patient for a 14 day Zio XT  monitor to be mailed to patients home  °

## 2020-11-18 NOTE — Telephone Encounter (Signed)
LVM to have pt call back to schedule AWV.   RE: confirm insurance and schedule AWV on my schedule if times are convenient for patient or other AWV schedule as template permits.   

## 2020-11-18 NOTE — Patient Instructions (Addendum)
Medication Instructions:  INCREASE losartan to 50 mg once daily  Please check your blood pressure at home daily, write it down.  Call the office or send message via Mychart with the readings in 2 weeks for Dr. Gardiner Rhyme to review.   *If you need a refill on your cardiac medications before your next appointment, please call your pharmacy*   Lab Work: Please return for labs in 2 weeks (BMET)-this can be completed at your appointment with the pharmacist   Our in office lab hours are Monday-Friday 8:00-4:00, closed for lunch 12:45-1:45 pm.  No appointment needed.  Testing/Procedures: Coronary CTA-see instructions below  Your physician has requested that you have an echocardiogram. Echocardiography is a painless test that uses sound waves to create images of your heart. It provides your doctor with information about the size and shape of your heart and how well your heart's chambers and valves are working. This procedure takes approximately one hour. There are no restrictions for this procedure.  Your physician has recommended that you have a sleep study. This test records several body functions during sleep, including: brain activity, eye movement, oxygen and carbon dioxide blood levels, heart rate and rhythm, breathing rate and rhythm, the flow of air through your mouth and nose, snoring, body muscle movements, and chest and belly movement.  ZIO XT- Long Term Monitor Instructions   Your physician has requested you wear a ZIO patch monitor for _14__ days.  This is a single patch monitor.   [employer] supplies one patch monitor per enrollment. Additional stickers are not available. Please do not apply patch if you will be having a Nuclear Stress Test, Echocardiogram, Cardiac CT, MRI, or Chest Xray during the period you would be wearing the monitor. The patch cannot be worn during these tests. You cannot remove and re-apply the ZIO XT patch monitor.  Your ZIO patch monitor will be sent Fed Ex from  [employer] directly to your home address. It may take 3-5 days to receive your monitor after you have been enrolled.  Once you have received your monitor, please review the enclosed instructions. Your monitor has already been registered assigning a specific monitor serial # to you.  Billing and Patient Assistance Program Information   We have supplied [employer] with any of your insurance information on file for billing purposes. [employer] offers a sliding scale Patient Assistance Program for patients that do not have insurance, or whose insurance does not completely cover the cost of the ZIO monitor.   You must apply for the Patient Assistance Program to qualify for this discounted rate.     To apply, please call [employer] at [phone], select option 4, then select option 2, and ask to apply for Patient Assistance Program.  Theodore Demark will ask your household income, and how many people are in your household.  They will quote your out-of-pocket cost based on that information.  [employer] will also be able to set up a 110-month interest-free payment plan if needed.  Applying the monitor   Shave hair from upper left chest.  Hold abrader disc by orange tab. Rub abrader in 40 strokes over the upper left chest as indicated in your monitor instructions.  Clean area with 4 enclosed alcohol pads. Let dry.  Apply patch as indicated in monitor instructions. Patch will be placed under collarbone on left side of chest with arrow pointing upward.  Rub patch adhesive wings for 2 minutes. Remove white label marked "1". Remove the white label marked "2". Rub patch  adhesive wings for 2 additional minutes.  While looking in a mirror, press and release button in center of patch. A small green light will flash 3-4 times. This will be your only indicator that the monitor has been turned on. ?  Do not shower for the first 24 hours. You may shower after the first 24 hours.  Press the button if you feel a symptom. You will  hear a small click. Record Date, Time and Symptom in the Patient Logbook.  When you are ready to remove the patch, follow instructions on the last 2 pages of the Patient Logbook. Stick patch monitor onto the last page of Patient Logbook.  Place Patient Logbook in the blue and white box.  Use locking tab on box and tape box closed securely.  The blue and white box has prepaid postage on it. Please place it in the mailbox as soon as possible. Your physician should have your test results approximately 7 days after the monitor has been mailed back to [facility].  Call [facility] at [phone] if you have questions regarding your ZIO XT patch monitor. Call them immediately if you see an orange light blinking on your monitor.  If your monitor falls off in less than 4 days, contact our Monitor department at [phone]. ?If your monitor becomes loose or falls off after 4 days call [employer] at [phone] for suggestions on securing your monitor.?  Follow-Up: At [facility], you and your health needs are our priority.  As part of our continuing mission to provide you with exceptional heart care, we have created designated Provider Care Teams.  These Care Teams include your primary Cardiologist (physician) and Advanced Practice Providers (APPs -  Physician Assistants and Nurse Practitioners) who all work together to provide you with the care you need, when you need it.  We recommend signing up for the patient portal called "MyChart".  Sign up information is provided on this After Visit Summary.  MyChart is used to connect with patients for Virtual Visits (Telemedicine).  Patients are able to view lab/test results, encounter notes, upcoming appointments, etc.  Non-urgent messages can be sent to your provider as well.   To learn more about what you can do with MyChart, go to [email or web address].    Your next appointment:   Pharmacist in 2 weeks 3 months with Dr.  Gardiner Rhyme   Other Instructions   Your cardiac CT will be scheduled at one of the below locations:   [facility] [address] [place], [place] [address] [phone]  If scheduled at [facility], please arrive at the [facility] main entrance (entrance A) of [facility] 30 minutes prior to test start time. Proceed to the [facility] Department (first floor) to check-in and test prep.  Please follow these instructions carefully (unless otherwise directed):  ` On the Night Before the Test: Be sure to Drink plenty of water. Do not consume any caffeinated/decaffeinated beverages or chocolate 12 hours prior to your test. Do not take any antihistamines 12 hours prior to your test.  On the Day of the Test: Drink plenty of water until 1 hour prior to the test. Do not eat any food 4 hours prior to the test. You may take your regular medications prior to the test.  HOLD MORNING DOSE OF CARVEDILOL (COREG) Take metoprolol (Lopressor) two hours prior to test. HOLD Hydrochlorothiazide morning of the test. FEMALES- please wear underwire-free bra if available, avoid dresses & tight  clothing     After the Test: Drink plenty of water. After receiving IV contrast, you may experience a mild flushed feeling. This is normal. On occasion, you may experience a mild rash up to 24 hours after the test. This is not dangerous. If this occurs, you can take Benadryl 25 mg and increase your fluid intake. If you experience trouble breathing, this can be serious. If it is severe call 911 IMMEDIATELY. If it is mild, please call our office. If you take any of these medications: Glipizide/Metformin, Avandament, Glucavance, please do not take 48 hours after completing test unless otherwise instructed.  Please allow 2-4 weeks for scheduling of routine cardiac CTs. Some insurance companies require a pre-authorization which may delay scheduling of this test.   For  non-scheduling related questions, please contact the cardiac imaging nurse navigator should you have any questions/concerns: Marchia Bond, Cardiac Imaging Nurse Navigator Gordy Clement, Cardiac Imaging Nurse Navigator [facility] Direct Office Dial: [phone]   For scheduling needs, including cancellations and rescheduling, please call [place], [phone].

## 2020-11-19 ENCOUNTER — Telehealth: Payer: Self-pay | Admitting: *Deleted

## 2020-11-19 NOTE — Telephone Encounter (Signed)
-----   Message from Silverio Lay, RN sent at 11/18/2020  3:35 PM EDT ----- Regarding: sleep study Sleep study ordered  [place] [address]  Thanks!

## 2020-11-19 NOTE — Telephone Encounter (Signed)
Patient notified of sleep study appointment details.

## 2020-11-22 ENCOUNTER — Other Ambulatory Visit: Payer: Self-pay | Admitting: Cardiology

## 2020-11-22 ENCOUNTER — Other Ambulatory Visit: Payer: Self-pay | Admitting: Family Medicine

## 2020-11-23 ENCOUNTER — Other Ambulatory Visit: Payer: Self-pay | Admitting: Cardiology

## 2020-11-23 ENCOUNTER — Other Ambulatory Visit: Payer: Self-pay | Admitting: Family Medicine

## 2020-11-23 DIAGNOSIS — I10 Essential (primary) hypertension: Secondary | ICD-10-CM

## 2020-11-24 ENCOUNTER — Telehealth: Payer: Self-pay | Admitting: Cardiology

## 2020-11-24 ENCOUNTER — Telehealth (HOSPITAL_COMMUNITY): Payer: Self-pay | Admitting: *Deleted

## 2020-11-24 NOTE — Telephone Encounter (Signed)
Patient returning call regarding upcoming cardiac imaging study; pt verbalizes understanding of appt date/time, parking situation and where to check in, pre-test NPO status and medications ordered, and verified current allergies; name and call back number provided for further questions should they arise  Gordy Clement RN [facility] [phone] office [phone] cell  Patient aware to get blood work prior to [place].  Patient to take '100mg'$  metoprolol tartrate two hours prior to CCTA and hold her daily carvedilol.

## 2020-11-24 NOTE — Telephone Encounter (Signed)
Attempted to call patient regarding upcoming cardiac CT appointment and the need for blood work prior to that appointment. Left message on voicemail with name and callback number  Gordy Clement RN Navigator Cardiac [facility] [phone] Office [phone] Cell

## 2020-11-24 NOTE — Telephone Encounter (Signed)
*  STAT* If patient is at the pharmacy, call can be transferred to refill team.   1. Which medications need to be refilled? (please list name of each medication and dose if known) hydrochlorothiazide (HYDRODIURIL) 25 MG tablet  2. Which pharmacy/location (including street and city if local pharmacy) is medication to be sent to? [place], [place] - [address] AT [place]  3. Do they need a 30 day or 90 day supply? 30ds

## 2020-11-25 LAB — BASIC METABOLIC PANEL
BUN/Creatinine Ratio: 15 (ref 12–28)
BUN: 15 mg/dL (ref 8–27)
CO2: 25 mmol/L (ref 20–29)
Calcium: 9.6 mg/dL (ref 8.7–10.3)
Chloride: 101 mmol/L (ref 96–106)
Creatinine, Ser: 1.02 mg/dL — ABNORMAL HIGH (ref 0.57–1.00)
Glucose: 88 mg/dL (ref 65–99)
Potassium: 4.2 mmol/L (ref 3.5–5.2)
Sodium: 139 mmol/L (ref 134–144)
eGFR: 59 mL/min/{1.73_m2} — ABNORMAL LOW (ref >59)

## 2020-11-26 ENCOUNTER — Other Ambulatory Visit: Payer: Self-pay

## 2020-11-26 ENCOUNTER — Ambulatory Visit (HOSPITAL_COMMUNITY)
Admission: RE | Admit: 2020-11-26 | Discharge: 2020-11-26 | Disposition: A | Discharge status: Home / Self Care | Payer: Medicare Other | Source: Ambulatory Visit | Attending: Internal Medicine | Admitting: Internal Medicine

## 2020-11-26 ENCOUNTER — Other Ambulatory Visit: Payer: Self-pay | Admitting: Internal Medicine

## 2020-11-26 ENCOUNTER — Ambulatory Visit (HOSPITAL_COMMUNITY)
Admission: RE | Admit: 2020-11-26 | Discharge: 2020-11-26 | Disposition: A | Discharge status: Home / Self Care | Payer: Medicare Other | Source: Ambulatory Visit | Attending: Cardiology | Admitting: Cardiology

## 2020-11-26 DIAGNOSIS — R931 Abnormal findings on diagnostic imaging of heart and coronary circulation: Secondary | ICD-10-CM

## 2020-11-26 DIAGNOSIS — I2510 Atherosclerotic heart disease of native coronary artery without angina pectoris: Secondary | ICD-10-CM

## 2020-11-26 DIAGNOSIS — R079 Chest pain, unspecified: Secondary | ICD-10-CM | POA: Diagnosis present

## 2020-11-26 DIAGNOSIS — Z006 Encounter for examination for normal comparison and control in clinical research program: Secondary | ICD-10-CM

## 2020-11-26 DIAGNOSIS — I700 Atherosclerosis of aorta: Secondary | ICD-10-CM | POA: Insufficient documentation

## 2020-11-26 MED ORDER — NITROGLYCERIN 0.4 MG SL SUBL
SUBLINGUAL_TABLET | SUBLINGUAL | Status: AC
  Filled 2020-11-26: qty 2

## 2020-11-26 MED ORDER — IOHEXOL 350 MG/ML SOLN
95.0000 mL | Freq: Once | INTRAVENOUS | Status: AC | PRN
  Administered 2020-11-26: 95 mL via INTRAVENOUS

## 2020-11-26 MED ORDER — NITROGLYCERIN 0.4 MG SL SUBL
0.4000 mg | SUBLINGUAL_TABLET | SUBLINGUAL | Status: DC | PRN
  Administered 2020-11-26: 0.8 mg via SUBLINGUAL

## 2020-11-26 NOTE — Research (Signed)
IDENTIFY Informed Consent                  Subject Name: [name]   Subject met inclusion and exclusion criteria.  The informed consent form, study requirements and expectations were reviewed with the subject and questions and concerns were addressed prior to the signing of the consent form.  The subject verbalized understanding of the trial requirements.  The subject agreed to participate in the IDENTIFY trial and signed the informed consent at 11:23AM on 11/26/20.  The informed consent was obtained prior to performance of any protocol-specific procedures for the subject.  A copy of the signed informed consent was given to the subject and a copy was placed in the subject's medical record.    Brenton Grills, Research Assistant

## 2020-11-29 ENCOUNTER — Ambulatory Visit (HOSPITAL_COMMUNITY)
Admission: EM | Admit: 2020-11-29 | Discharge: 2020-11-29 | Disposition: A | Discharge status: Home / Self Care | Payer: Medicare Other | Attending: Family Medicine | Admitting: Family Medicine

## 2020-11-29 ENCOUNTER — Other Ambulatory Visit: Payer: Self-pay

## 2020-11-29 ENCOUNTER — Encounter (HOSPITAL_COMMUNITY): Payer: Self-pay

## 2020-11-29 DIAGNOSIS — R350 Frequency of micturition: Secondary | ICD-10-CM | POA: Insufficient documentation

## 2020-11-29 DIAGNOSIS — J069 Acute upper respiratory infection, unspecified: Secondary | ICD-10-CM | POA: Diagnosis not present

## 2020-11-29 DIAGNOSIS — R030 Elevated blood-pressure reading, without diagnosis of hypertension: Secondary | ICD-10-CM | POA: Diagnosis not present

## 2020-11-29 DIAGNOSIS — Z20822 Contact with and (suspected) exposure to covid-19: Secondary | ICD-10-CM | POA: Insufficient documentation

## 2020-11-29 LAB — POCT URINALYSIS DIPSTICK, ED / UC
Bilirubin Urine: NEGATIVE
Glucose, UA: NEGATIVE mg/dL
Ketones, ur: NEGATIVE mg/dL
Leukocytes,Ua: NEGATIVE
Nitrite: NEGATIVE
Protein, ur: NEGATIVE mg/dL
Specific Gravity, Urine: 1.02 (ref 1.005–1.030)
Urobilinogen, UA: 0.2 mg/dL (ref 0.0–1.0)
pH: 6.5 (ref 5.0–8.0)

## 2020-11-29 MED ORDER — TRAMADOL HCL 50 MG PO TABS: 50.0000 mg | ORAL_TABLET | Freq: Every day | ORAL | 0 refills | Status: DC | PRN

## 2020-11-29 MED ORDER — PROMETHAZINE-DM 6.25-15 MG/5ML PO SYRP: 5.0000 mL | ORAL_SOLUTION | Freq: Four times a day (QID) | ORAL | 0 refills | Status: DC | PRN

## 2020-11-29 NOTE — ED Triage Notes (Signed)
Pt present cough and body aches symptom started on Saturday.

## 2020-11-29 NOTE — ED Provider Notes (Signed)
[facility]    CSN: [id number] Arrival date & time: 11/29/20  1720      History   Chief Complaint Chief Complaint  Patient presents with   Generalized Body Aches   Cough    HPI [name] is a 70 y.o. female.   Patient presenting today with 2-day history of cough, significant body aches, congestion, scratchy throat, bilateral ear pain and pressure, headache.  Denies chest pain, shortness of breath, abdominal pain, nausea vomiting or diarrhea.  She has not taken anything other than Tylenol which does not seem to be helping.  No known sick contacts recently.  She also states has been having some urinary frequency and dysuria for about a week now and wants to be checked for a urinary tract infection.  Past Medical History:  Diagnosis Date   Abnormal EKG 2012   hospitalized for T wave inversion in lateral leads with MSK chest pains, normal ECHO, negative trops, no cardiology consult. recent EKG 7/15 stable T wave inversions.    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   HLD (hyperlipidemia)    Aortic atherosclerosis ([facility])    Back pain 07/08/2015   Scotoma of blind spot area in visual field 06/14/2015   Bilateral leg pain 06/14/2015   Seasonal allergies 03/09/2015   Hypopigmentation 03/09/2015   Osteoarthritis of left knee 07/08/2014   DJD (degenerative  joint disease), lumbar 07/08/2014   Osteoarthritis of right knee 07/08/2014   Urinary frequency 06/11/2014   Healthcare maintenance 06/11/2014   Allergic rhinoconjunctivitis of both eyes 06/11/2014   Nearsightedness 04/27/2014   Abnormal EKG    Hypertension 12/11/2013    Past Surgical History:  Procedure Laterality Date   ABDOMINAL SURGERY  2010   for bowel blockage   COLON SURGERY  2010   bowel blockage   COLONOSCOPY  09/2014   HYSTEROSCOPY WITH D & C N/A 05/08/2018   Procedure: DILATATION AND CURETTAGE /HYSTEROSCOPY;  Surgeon: Mora Bellman, MD;  Location: [facility];  Service: Gynecology;  Laterality: N/A;   KNEE ARTHROSCOPY Left 09/25/2013   Procedure: LEFT KNEE ARTHROSCOPY WITH PARTIAL MEDIAL AND LATERAL  MENISCECTOMY/DEBRIDEMENT/MICRO FRACTURE MEDIAL FEMORAL CONDYLE, REMOVAL OF OSTEOCONDRAL FRAGMENT;  Surgeon: Johnn Hai, MD;  Location: [facility];  Service: Orthopedics;  Laterality: Left;   KNEE SURGERY Right 1999   POLYPECTOMY     TEE WITHOUT CARDIOVERSION N/A 10/04/2015   Procedure: TRANSESOPHAGEAL ECHOCARDIOGRAM (TEE);  Surgeon: Sueanne Margarita, MD;  [facility];  Service: Cardiovascular;  Laterality: N/A;   TUBAL LIGATION  1978    OB History     Gravida  2   Para  2   Term      Preterm      AB      Living  2  SAB      IAB      Ectopic      Multiple      Live Births               Home Medications    Prior to Admission medications   Medication Sig Start Date End Date Taking? Authorizing Provider  promethazine-dextromethorphan (PROMETHAZINE-DM) 6.25-15 MG/5ML syrup Take 5 mLs by mouth 4 (four) times daily as needed for cough. 11/29/20  Yes Volney American, PA-C  traMADol (ULTRAM) 50 MG tablet Take 1 tablet (50 mg total) by mouth daily as needed. 11/29/20  Yes Volney American, PA-C  aspirin EC 81 MG tablet Take 1 tablet (81 mg total) daily by mouth. 01/31/17   Clent Demark, PA-C  atorvastatin  (LIPITOR) 40 MG tablet Take 1 tablet (40 mg total) by mouth daily at 6 PM. 03/31/20   Gifford Shave, MD  carvedilol (COREG) 12.5 MG tablet TAKE 1 TABLET(12.5 MG) BY MOUTH TWICE DAILY WITH A MEAL Patient taking differently: Take 12.5 mg by mouth 2 (two) times daily with a meal. 08/08/19   Rumball, Jake Church, DO  fluticasone (FLONASE) 50 MCG/ACT nasal spray SHAKE LIQUID AND USE 1 SPRAY IN EACH NOSTRIL EVERY DAY 11/22/20   Gifford Shave, MD  hydrochlorothiazide (HYDRODIURIL) 25 MG tablet TAKE 1 TABLET(25 MG) BY MOUTH EVERY DAY 11/24/20   Gifford Shave, MD  losartan (COZAAR) 50 MG tablet Take 1 tablet (50 mg total) by mouth at bedtime. 11/18/20   Donato Heinz, MD  metoprolol tartrate (LOPRESSOR) 100 MG tablet Take 100 mg (1 tablet) two hours prior to CT scan 11/18/20   Donato Heinz, MD  Multiple Vitamin (MULITIVITAMIN WITH MINERALS) TABS Take 1 tablet by mouth daily.    [provider]  potassium chloride SA (KLOR-CON) 20 MEQ tablet TAKE 1 TABLET(20 MEQ) BY MOUTH DAILY 07/16/20   Gifford Shave, MD    Family History Family History  Problem Relation Age of Onset   Hypertension Mother    Heart disease Mother        has a pacemaker    Alzheimer's disease Mother    Hyperlipidemia Mother    Osteoporosis Mother    Heart failure Father    Hypertension Father    Alzheimer's disease Father    Hypertension Sister    Breast cancer Sister    Hypertension Sister    Gout Sister    Alcohol abuse Son    Pancreatitis Son    Cancer Neg Hx    Colon cancer Neg Hx    Esophageal cancer Neg Hx    Stomach cancer Neg Hx    Rectal cancer Neg Hx     Social History Social History   Tobacco Use   Smoking status: Former    Types: Cigarettes    Quit date: 07/29/2010    Years since quitting: 10.3   Smokeless tobacco: Never  Vaping Use   Vaping Use: Never used  Substance Use Topics   Alcohol use: Never    Alcohol/week: 0.0 standard drinks   Drug use: Never      Allergies   Other, Shrimp [shellfish allergy], Latex, and Penicillins   Review of Systems Review of Systems Per HPI  Physical Exam Triage Vital Signs ED Triage Vitals  Enc Vitals Group     BP 11/29/20 1827 (!) 199/83     Pulse Rate 11/29/20 1827 66     Resp 11/29/20 1827 18     Temp 11/29/20  1827 98.3 F (36.8 C)     Temp Source 11/29/20 1827 Oral     SpO2 11/29/20 1827 100 %     Weight --      Height --      Head Circumference --      Peak Flow --      Pain Score 11/29/20 1824 10     Pain Loc --      Pain Edu? --      Excl. in [place]? --    No data found.  Updated Vital Signs BP (!) 199/83 (BP Location: Right Arm)   Pulse 66   Temp 98.3 F (36.8 C) (Oral)   Resp 18   SpO2 100%   Visual Acuity Right Eye Distance:   Left Eye Distance:   Bilateral Distance:    Right Eye Near:   Left Eye Near:    Bilateral Near:     Physical Exam Vitals and nursing note reviewed.  Constitutional:      Appearance: Normal appearance. She is not ill-appearing.  HENT:     Head: Atraumatic.     Right Ear: Tympanic membrane normal.     Left Ear: Tympanic membrane normal.     Nose: Rhinorrhea present.     Mouth/Throat:     Mouth: Mucous membranes are moist.     Pharynx: Posterior oropharyngeal erythema present. No oropharyngeal exudate.  Eyes:     Extraocular Movements: Extraocular movements intact.     Conjunctiva/sclera: Conjunctivae normal.  Cardiovascular:     Rate and Rhythm: Normal rate and regular rhythm.     Heart sounds: Normal heart sounds.  Pulmonary:     Effort: Pulmonary effort is normal. No respiratory distress.     Breath sounds: Normal breath sounds. No wheezing or rales.  Abdominal:     General: Bowel sounds are normal. There is no distension.     Palpations: Abdomen is soft.     Tenderness: There is no abdominal tenderness. There is no right CVA tenderness, left CVA tenderness or guarding.  Musculoskeletal:        General: Normal range of motion.      Cervical back: Normal range of motion and neck supple.  Skin:    General: Skin is warm and dry.  Neurological:     Mental Status: She is alert and oriented to person, place, and time.  Psychiatric:        Mood and Affect: Mood normal.        Thought Content: Thought content normal.        Judgment: Judgment normal.    UC Treatments / Results  Labs (all labs ordered are listed, but only abnormal results are displayed) Labs Reviewed  POCT URINALYSIS DIPSTICK, ED / UC - Abnormal; Notable for the following components:      Result Value   Hgb urine dipstick TRACE (*)    All other components within normal limits  SARS CORONAVIRUS 2 (TAT 6-24 HRS)   EKG  Radiology No results found.  Procedures Procedures (including critical care time)  Medications Ordered in UC Medications - No data to display  Initial Impression / Assessment and Plan / UC Course  I have reviewed the triage vital signs and the nursing notes.  Pertinent labs & imaging results that were available during my care of the patient were reviewed by me and considered in my medical decision making (see chart for details).     Significantly hypertensive in triage but otherwise vital signs  benign and reassuring.  Overall exam findings very reassuring.  Suspect COVID-like illness, COVID PCR pending, Phenergan DM and Mucinex recommended for symptomatic benefit.  Tylenol as needed, several tramadol given per her request for the body aches as the Tylenol has not been helping and she cannot take NSAIDs.  Precautions reviewed at length and PDMP reviewed and appropriate.  Regarding her urinary symptoms, it appears that this is a chronic issue and she does not have an active urinary tract infection from the appearance of her urinary sample today.  Discussed to push fluids, continue to monitor.  She states that she has a follow-up appointment scheduled next week with her primary care provider and we will recheck all of these issues at  that time.  Return for acutely worsening symptoms.  Final Clinical Impressions(s) / UC Diagnoses   Final diagnoses:  Viral URI with cough  Elevated blood pressure reading  Urinary frequency   Discharge Instructions   None    ED Prescriptions     Medication Sig Dispense Auth. Provider   promethazine-dextromethorphan (PROMETHAZINE-DM) 6.25-15 MG/5ML syrup Take 5 mLs by mouth 4 (four) times daily as needed for cough. 100 mL Volney American, PA-C   traMADol (ULTRAM) 50 MG tablet Take 1 tablet (50 mg total) by mouth daily as needed. 5 tablet Volney American, [place]      I have reviewed the PDMP during this encounter.   Volney American, [place] 11/29/20 [phone]

## 2020-11-30 LAB — SARS CORONAVIRUS 2 (TAT 6-24 HRS): SARS Coronavirus 2: NEGATIVE

## 2020-12-01 ENCOUNTER — Encounter: Payer: Self-pay | Admitting: *Deleted

## 2020-12-02 ENCOUNTER — Other Ambulatory Visit: Payer: Self-pay | Admitting: *Deleted

## 2020-12-02 DIAGNOSIS — E785 Hyperlipidemia, unspecified: Secondary | ICD-10-CM

## 2020-12-02 DIAGNOSIS — R931 Abnormal findings on diagnostic imaging of heart and coronary circulation: Secondary | ICD-10-CM

## 2020-12-02 DIAGNOSIS — I2510 Atherosclerotic heart disease of native coronary artery without angina pectoris: Secondary | ICD-10-CM | POA: Diagnosis not present

## 2020-12-02 MED ORDER — ATORVASTATIN CALCIUM 80 MG PO TABS: 80.0000 mg | ORAL_TABLET | Freq: Every day | ORAL | 3 refills | Status: DC

## 2020-12-05 ENCOUNTER — Other Ambulatory Visit: Payer: Self-pay | Admitting: Family Medicine

## 2020-12-06 ENCOUNTER — Telehealth: Payer: Self-pay | Admitting: Cardiology

## 2020-12-06 NOTE — Progress Notes (Signed)
Cardiology Office Note:    Date:  12/09/2020   ID:  [name], DOB November 30, 1950, MRN [id number]  PCP:  Gifford Shave, MD  Cardiologist:  None  Electrophysiologist:  None   Referring MD: Gifford Shave, MD   No chief complaint on file.   History of Present Illness:    [name] is a 70 y.o. female with a hx of hypertension, hyperlipidemia, renal infarct, ?aortic valve vegetation who is referred by Dr. Andria Frames for evaluation of chest pain.  She was admitted to [facility] from 7/6 through 10/04/2015 with lower abdominal pain.  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  She was started on heparin drip.  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  Blood cultures were negative.  She was started on anticoagulation with warfarin.  Echo 10/01/2015 showed EF 65 to [id number], grade 1 diastolic dysfunction.  She completed 6 months of anticoagulation and was then transitioned to aspirin 81 mg daily.  No further episodes.  She reports she started having chest pain in April.  Describes lower left-sided chest pain that feels sharp.  Typically last for about 5 minutes.  Has not noticed with exertion but does notice it can occur with stress.  She walks 2 days/week for 30 minutes.  She denies any dyspnea, headedness, syncope, or palpitations.  Does report intermittent ankle swelling.  Reports does not check BP regularly at home, was 149/80 when last checked.  She quit smoking 10 years ago (smoked 0.5 packs/day for 10 years).  Family history includes mother had pacemaker, she is unsure of details.  Coronary CTA on 11/26/2020 showed moderate CAD in mid and distal RCA, calcium score 936 (97th percentile).  CT FFR suggested total occlusion in mid to distal RCA and indeterminate FFR value in distal LAD that may be due to vessel tapering of small caliber distal vessel stenosis.  Since last clinic visit, reports he has been  doing okay.  Had 1 episode of chest pain since last appointment, but lasted short duration.  Denies any dyspnea, lightheadedness, syncope, lower extremity edema, or palpitations.    BP Readings from Last 3 Encounters:  12/07/20 (!) 162/74  11/29/20 (!) 199/83  11/26/20 (!) 160/77      Past Medical History:  Diagnosis Date   Abnormal EKG 2012   hospitalized for T wave inversion in lateral leads with MSK chest pains, normal ECHO, negative trops, no cardiology consult. recent EKG 7/15 stable T wave inversions.    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[place]) 2017   blood clot in the kidney    Past Surgical History:  Procedure Laterality Date   ABDOMINAL SURGERY  2010   for bowel blockage   COLON SURGERY  2010   bowel blockage   COLONOSCOPY  09/2014   HYSTEROSCOPY WITH D & C N/A 05/08/2018   Procedure: DILATATION AND CURETTAGE /HYSTEROSCOPY;  Surgeon: Mora Bellman, MD;  Location: [place];  Service: Gynecology;  Laterality: N/A;   KNEE ARTHROSCOPY Left 09/25/2013   Procedure: LEFT KNEE ARTHROSCOPY WITH PARTIAL MEDIAL AND LATERAL  MENISCECTOMY/DEBRIDEMENT/MICRO FRACTURE MEDIAL FEMORAL CONDYLE, REMOVAL OF OSTEOCONDRAL FRAGMENT;  Surgeon: Johnn Hai, MD;  Location: [facility];  Service: Orthopedics;  Laterality: Left;   KNEE SURGERY Right 1999   POLYPECTOMY     TEE WITHOUT CARDIOVERSION N/A 10/04/2015   Procedure: TRANSESOPHAGEAL ECHOCARDIOGRAM (TEE);  Surgeon: Sueanne Margarita, MD;  Location: [facility] ENDOSCOPY;  Service: Cardiovascular;  Laterality: N/A;   TUBAL LIGATION  1978    Current Medications: Current Meds  Medication Sig   aspirin EC 81 MG tablet Take 1 tablet (81 mg total) daily by mouth.   atorvastatin (LIPITOR) 80 MG tablet Take 1 tablet (80 mg total) by mouth daily at 6  PM. (Patient taking differently: Take 40 mg by mouth daily at 6 PM.)   fluticasone (FLONASE) 50 MCG/ACT nasal spray SHAKE LIQUID AND USE 1 SPRAY IN EACH NOSTRIL EVERY DAY (Patient taking differently: Place 2 sprays into both nostrils 2 (two) times daily.)   gabapentin (NEURONTIN) 300 MG capsule Take 300 mg by mouth 2 (two) times daily.   hydrochlorothiazide (HYDRODIURIL) 25 MG tablet TAKE 1 TABLET(25 MG) BY MOUTH EVERY DAY   losartan (COZAAR) 50 MG tablet Take 1 tablet (50 mg total) by mouth at bedtime.   Multiple Vitamin (MULITIVITAMIN WITH MINERALS) TABS Take 1 tablet by mouth daily.   nitroGLYCERIN (NITROSTAT) 0.4 MG SL tablet Place 1 tablet (0.4 mg total) under the tongue every 5 (five) minutes as needed.   potassium chloride SA (KLOR-CON) 20 MEQ tablet TAKE 1 TABLET(20 MEQ) BY MOUTH DAILY   [DISCONTINUED] carvedilol (COREG) 12.5 MG tablet Take 1 tablet (12.5 mg total) by mouth 2 (two) times daily with a meal.     Allergies:   Shrimp [shellfish allergy], Latex, and Penicillins   Social History   Socioeconomic History   Marital status: Single    Spouse name: Not on file   Number of children: 2   Years of education: some colle   Highest education level: Not on file  Occupational History   Occupation: Custodian     Employer: [employer]  Tobacco Use   Smoking status: Former    Types: Cigarettes    Quit date: 07/29/2010    Years since quitting: 10.3   Smokeless tobacco: Never  Vaping Use   Vaping Use: Never used  Substance and Sexual Activity   Alcohol use: Never    Alcohol/week: 0.0 standard drinks   Drug use: Never   Sexual activity: Not Currently    Birth control/protection: Surgical    Comment: 1st intercourse 70 yo-Fewer than 5 partners  Other Topics Concern   Not on file  Social History Narrative   Live with mom and son (30).   Social Determinants of Health   Financial Resource Strain: Not on file  Food Insecurity: Not on file  Transportation Needs: Not  on file  Physical Activity: Not on file  Stress: Not on file  Social Connections: Not on file     Family History: The patient's family history includes Alcohol abuse in her son; Alzheimer's disease in her father and mother; Breast cancer in her sister; Gout in her sister; Heart disease in her mother; Heart failure in her father; Hyperlipidemia in her mother; Hypertension in her father, mother, sister, and sister; Osteoporosis in her mother; Pancreatitis in her son. There is no history of Cancer, Colon cancer, Esophageal cancer, Stomach cancer, or Rectal cancer.  ROS:   Please see the history of present illness.     All other systems reviewed and are negative.  EKGs/Labs/Other Studies Reviewed:    The following studies were reviewed today:  EKG:  EKG is  ordered today.  The ekg ordered today demonstrates normal sinus rhythm, rate 68, T wave inversions in leads I, aVL, V3-6   Recent Labs: 06/19/2020: ALT 20 12/07/2020: BUN 18; Creatinine, Ser 1.07; Hemoglobin 12.5; Platelets 303; Potassium 4.2; Sodium 141  Recent Lipid Panel    Component Value Date/Time   CHOL 208 (H) 10/14/2020 1513   TRIG 275 (H) 10/14/2020 1513   HDL 52 10/14/2020 1513   CHOLHDL 4.0 10/14/2020 1513   CHOLHDL 4.7 10/01/2015 0732   VLDL 27 10/01/2015 0732   LDLCALC 109 (H) 10/14/2020 1513    Physical Exam:    VS:  BP (!) 162/74 (BP Location: Left Arm, Patient Position: Sitting, Cuff Size: Large)   Pulse 68   Ht '5\' 5"'$  (1.651 m)   Wt 196 lb (88.9 kg)   SpO2 98%   BMI 32.62 kg/m     Wt Readings from Last 3 Encounters:  12/07/20 196 lb (88.9 kg)  11/18/20 198 lb 12.8 oz (90.2 kg)  10/14/20 197 lb 12.8 oz (89.7 kg)     GEN:  Well nourished, well developed in no acute distress HEENT: Normal NECK: No JVD; No carotid bruits LYMPHATICS: No lymphadenopathy CARDIAC: [id number] systolic murmur RESPIRATORY:  Clear to auscultation without rales, wheezing or rhonchi  ABDOMEN: Soft, non-tender,  non-distended MUSCULOSKELETAL:  Trace edema; No deformity  SKIN: Warm and dry NEUROLOGIC:  Alert and oriented x 3 PSYCHIATRIC:  Normal affect   ASSESSMENT:    1. Coronary artery disease of native artery of native heart with stable angina pectoris ([place])   2. Abnormal cardiac CT angiography   3. Pre-procedure lab exam   4. Heart murmur   5. Renal infarct ([place])   6. Essential hypertension   7. Hyperlipidemia, unspecified hyperlipidemia type   8. Daytime somnolence     PLAN:    CAD: Reports atypical chest pain, no clear relationship with exertion but does appear to be correlated to stress.  Coronary CTA on 11/26/2020 showed moderate CAD in mid and distal RCA, calcium score 936 (97th percentile).  CT FFR suggested total occlusion in mid to distal RCA and indeterminate FFR value in distal LAD that may be due to vessel tapering of small caliber distal vessel stenosis. -Recommend cardiac catheterization.  Risks and benefits of cardiac catheterization have been discussed with the patient.  These include bleeding, infection, kidney damage, stroke, heart attack, death.  The patient understands these risks and is willing to proceed. -Continue aspirin 81 mg daily -Continue atorvastatin 80 mg daily -Increase carvedilol to 25 mg twice daily -As needed SL NTG  Heart murmur: 2 out of 6 systolic murmur on exam, will check echocardiogram  Renal infarct: Presented with renal infarct in 2017.  TEE showed possible aortic valve vegetation versus Lambls excrescence.  Blood cultures negative.  Completed 6 months of warfarin.  Has not had recurrent evidence of embolic events -Check echocardiogram -Zio patch x2 weeks to evaluate for A. Fib  Hypertension: On carvedilol 12.5 mg twice daily, HCTZ 25 mg daily, losartan 50 mg daily.  BP elevated, will increase carvedilol to 25 mg twice daily.  Asked to check BP daily for next 2 weeks.  Schedule follow-up in pharmacy hypertension clinic in 2 weeks for further  medication titration  Hyperlipidemia: On atorvastatin 40 mg daily,  LDL 109 on 10/14/2020.  Atorvastatin increased to 80 mg daily  Daytime somnolence: Check sleep study.  Epworth score 16   RTC in 6 to 8 weeks  Medication Adjustments/Labs and Tests Ordered: Current medicines are reviewed at length with the patient today.  Concerns regarding medicines are outlined above.  Orders Placed This Encounter  Procedures   Basic metabolic panel   CBC   EKG 12-Lead     Meds ordered this encounter  Medications   carvedilol (COREG) 25 MG tablet    Sig: Take 1 tablet (25 mg total) by mouth 2 (two) times daily with a meal.    Dispense:  180 tablet    Refill:  3    Dose increase   nitroGLYCERIN (NITROSTAT) 0.4 MG SL tablet    Sig: Place 1 tablet (0.4 mg total) under the tongue every 5 (five) minutes as needed.    Dispense:  25 tablet    Refill:  3      Patient Instructions  Medication Instructions:  INCREASE carvedilol (Coreg) to 25 mg two times daily  Take sublingual nitroglycerin AS NEEDED for chest pain  *If you need a refill on your cardiac medications before your next appointment, please call your pharmacy*   Lab Work: BMET, CBC today  If you have labs (blood work) drawn today and your tests are completely normal, you will receive your results only by: [facility] (if you have MyChart) OR A paper copy in the mail If you have any lab test that is abnormal or we need to change your treatment, we will call you to review the results.   Testing/Procedures: Your physician has requested that you have a cardiac catheterization. Cardiac catheterization is used to diagnose and/or treat various heart conditions. Doctors may recommend this procedure for a number of different reasons. The most common reason is to evaluate chest pain. Chest pain can be a symptom of coronary artery disease (CAD), and cardiac catheterization can show whether plaque is narrowing or blocking your heart's  arteries. This procedure is also used to evaluate the valves, as well as measure the blood flow and oxygen levels in different parts of your heart. For further information please visit [email or web address]. Please follow instruction sheet, as given.  Follow-Up: At [facility], you and your health needs are our priority.  As part of our continuing mission to provide you with exceptional heart care, we have created designated Provider Care Teams.  These Care Teams include your primary Cardiologist (physician) and Advanced Practice Providers (APPs -  Physician Assistants and Nurse Practitioners) who all work together to provide you with the care you need, when you need it.  We recommend signing up for the patient portal called "MyChart".  Sign up information is provided on this After Visit Summary.  MyChart is used to connect with patients for Virtual Visits (Telemedicine).  Patients are able to view lab/test results, encounter notes, upcoming appointments, etc.  Non-urgent messages can be sent to your provider as well.   To learn more about what you can do with MyChart, go to [email or web address].    Your next appointment:   2 weeks with pharmacist  6-8 weeks with Dr. Gardiner Rhyme  Other Instructions Please call tomorrow to let us know what day is okay to schedule heart catheterization   [place] [address] Dept: [phone] Loc: [phone]  Azile Antonovich  12/07/2020  You are scheduled for a ____ on _____  with Dr. _______.  1. Please arrive at the [facility] (Main Entrance A) at [facility]: [address] [place], [place] [address] at  ______ (This time is two hours before your procedure to ensure your preparation). Free valet parking service is available.   Special note: Every effort is made to have your procedure done on time. Please understand that emergencies  sometimes delay scheduled procedures.  2. Diet: Do not eat solid foods after midnight.  The patient may have clear liquids until 5am upon the day of the procedure.  3. Labs: Today in office   4. Medication instructions in preparation for your procedure:  Hold Losartan day before and AM of procedure Hold HCTZ AM of procedure    Contrast Allergy: No  On the morning of your procedure, take your Aspirin and any morning medicines NOT listed above.  You may use sips of water.  5. Plan for one night stay--bring personal belongings. 6. Bring a current list of your medications and current insurance cards. 7. You MUST have a responsible person to drive you home. 8. Someone MUST be with you the first 24 hours after you arrive home or your discharge will be delayed. 9. Please wear clothes that are easy to get on and off and wear slip-on shoes.  Thank you for allowing [place] to care for you!   -- [facility] Invasive Cardiovascular services    Signed, Donato Heinz, MD  12/09/2020 1:41 PM    [place]

## 2020-12-06 NOTE — Telephone Encounter (Signed)
*  STAT* If patient is at the pharmacy, call can be transferred to refill team.   1. Which medications need to be refilled? (please list name of each medication and dose if known)  carvedilol (COREG) 12.5 MG tablet  2. Which pharmacy/location (including street and city if local pharmacy) is medication to be sent to? [facility] [place], [place]  3. Do they need a 30 day or 90 day supply?  90 day supply

## 2020-12-06 NOTE — H&P (View-Only) (Signed)
Cardiology Office Note:    Date:  12/09/2020   ID:  [name], DOB April 01, 1950, MRN [id number]  PCP:  Gifford Shave, MD  Cardiologist:  None  Electrophysiologist:  None   Referring MD: Gifford Shave, MD   No chief complaint on file.   History of Present Illness:    [name] is a 70 y.o. female with a hx of hypertension, hyperlipidemia, renal infarct, ?aortic valve vegetation who is referred by Dr. Andria Frames for evaluation of chest pain.  She was admitted to [facility] from 7/6 through 10/04/2015 with lower abdominal pain.  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  She was started on heparin drip.  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  Blood cultures were negative.  She was started on anticoagulation with warfarin.  Echo 10/01/2015 showed EF 65 to [id number], grade 1 diastolic dysfunction.  She completed 6 months of anticoagulation and was then transitioned to aspirin 81 mg daily.  No further episodes.  She reports she started having chest pain in April.  Describes lower left-sided chest pain that feels sharp.  Typically last for about 5 minutes.  Has not noticed with exertion but does notice it can occur with stress.  She walks 2 days/week for 30 minutes.  She denies any dyspnea, headedness, syncope, or palpitations.  Does report intermittent ankle swelling.  Reports does not check BP regularly at home, was 149/80 when last checked.  She quit smoking 10 years ago (smoked 0.5 packs/day for 10 years).  Family history includes mother had pacemaker, she is unsure of details.  Coronary CTA on 11/26/2020 showed moderate CAD in mid and distal RCA, calcium score 936 (97th percentile).  CT FFR suggested total occlusion in mid to distal RCA and indeterminate FFR value in distal LAD that may be due to vessel tapering of small caliber distal vessel stenosis.  Since last clinic visit, reports he has been  doing okay.  Had 1 episode of chest pain since last appointment, but lasted short duration.  Denies any dyspnea, lightheadedness, syncope, lower extremity edema, or palpitations.    BP Readings from Last 3 Encounters:  12/07/20 (!) 162/74  11/29/20 (!) 199/83  11/26/20 (!) 160/77      Past Medical History:  Diagnosis Date   Abnormal EKG 2012   hospitalized for T wave inversion in lateral leads with [facility] chest pains, normal ECHO, negative trops, no cardiology consult. recent EKG 7/15 stable T wave inversions.    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   blood clot in the kidney    Past Surgical History:  Procedure Laterality Date   ABDOMINAL SURGERY  2010   for bowel blockage   COLON SURGERY  2010   bowel blockage   COLONOSCOPY  09/2014   HYSTEROSCOPY WITH D & C N/A 05/08/2018   Procedure: DILATATION AND CURETTAGE /HYSTEROSCOPY;  Surgeon: Mora Bellman, MD;  Location: [facility];  Service: Gynecology;  Laterality: N/A;   KNEE ARTHROSCOPY Left 09/25/2013   Procedure: LEFT KNEE ARTHROSCOPY WITH PARTIAL MEDIAL AND LATERAL  MENISCECTOMY/DEBRIDEMENT/MICRO FRACTURE MEDIAL FEMORAL CONDYLE, REMOVAL OF OSTEOCONDRAL FRAGMENT;  Surgeon: Johnn Hai, MD;  Location: [facility];  Service: Orthopedics;  Laterality: Left;   KNEE SURGERY Right 1999   POLYPECTOMY     TEE WITHOUT CARDIOVERSION N/A 10/04/2015   Procedure: TRANSESOPHAGEAL ECHOCARDIOGRAM (TEE);  Surgeon: Sueanne Margarita, MD;  Location: [facility] ENDOSCOPY;  Service: Cardiovascular;  Laterality: N/A;   TUBAL LIGATION  1978    Current Medications: Current Meds  Medication Sig   aspirin EC 81 MG tablet Take 1 tablet (81 mg total) daily by mouth.   atorvastatin (LIPITOR) 80 MG tablet Take 1 tablet (80 mg total) by mouth daily at 6  PM. (Patient taking differently: Take 40 mg by mouth daily at 6 PM.)   fluticasone (FLONASE) 50 MCG/ACT nasal spray SHAKE LIQUID AND USE 1 SPRAY IN EACH NOSTRIL EVERY DAY (Patient taking differently: Place 2 sprays into both nostrils 2 (two) times daily.)   gabapentin (NEURONTIN) 300 MG capsule Take 300 mg by mouth 2 (two) times daily.   hydrochlorothiazide (HYDRODIURIL) 25 MG tablet TAKE 1 TABLET(25 MG) BY MOUTH EVERY DAY   losartan (COZAAR) 50 MG tablet Take 1 tablet (50 mg total) by mouth at bedtime.   Multiple Vitamin (MULITIVITAMIN WITH MINERALS) TABS Take 1 tablet by mouth daily.   nitroGLYCERIN (NITROSTAT) 0.4 MG SL tablet Place 1 tablet (0.4 mg total) under the tongue every 5 (five) minutes as needed.   potassium chloride SA (KLOR-CON) 20 MEQ tablet TAKE 1 TABLET(20 MEQ) BY MOUTH DAILY   [DISCONTINUED] carvedilol (COREG) 12.5 MG tablet Take 1 tablet (12.5 mg total) by mouth 2 (two) times daily with a meal.     Allergies:   Shrimp [shellfish allergy], Latex, and Penicillins   Social History   Socioeconomic History   Marital status: Single    Spouse name: Not on file   Number of children: 2   Years of education: some colle   Highest education level: Not on file  Occupational History   Occupation: Custodian     Employer: [employer]  Tobacco Use   Smoking status: Former    Types: Cigarettes    Quit date: 07/29/2010    Years since quitting: 10.3   Smokeless tobacco: Never  Vaping Use   Vaping Use: Never used  Substance and Sexual Activity   Alcohol use: Never    Alcohol/week: 0.0 standard drinks   Drug use: Never   Sexual activity: Not Currently    Birth control/protection: Surgical    Comment: 1st intercourse 70 yo-Fewer than 5 partners  Other Topics Concern   Not on file  Social History Narrative   Live with mom and son (22).   Social Determinants of Health   Financial Resource Strain: Not on file  Food Insecurity: Not on file  Transportation Needs: Not  on file  Physical Activity: Not on file  Stress: Not on file  Social Connections: Not on file     Family History: The patient's family history includes Alcohol abuse in her son; Alzheimer's disease in her father and mother; Breast cancer in her sister; Gout in her sister; Heart disease in her mother; Heart failure in her father; Hyperlipidemia in her mother; Hypertension in her father, mother, sister, and sister; Osteoporosis in her mother; Pancreatitis in her son. There is no history of Cancer, Colon cancer, Esophageal cancer, Stomach cancer, or Rectal cancer.  ROS:   Please see the history of present illness.     All other systems reviewed and are negative.  EKGs/Labs/Other Studies Reviewed:    The following studies were reviewed today:  EKG:  EKG is  ordered today.  The ekg ordered today demonstrates normal sinus rhythm, rate 68, T wave inversions in leads I, aVL, V3-6   Recent Labs: 06/19/2020: ALT 20 12/07/2020: BUN 18; Creatinine, Ser 1.07; Hemoglobin 12.5; Platelets 303; Potassium 4.2; Sodium 141  Recent Lipid Panel    Component Value Date/Time   CHOL 208 (H) 10/14/2020 1513   TRIG 275 (H) 10/14/2020 1513   HDL 52 10/14/2020 1513   CHOLHDL 4.0 10/14/2020 1513   CHOLHDL 4.7 10/01/2015 0732   VLDL 27 10/01/2015 0732   LDLCALC 109 (H) 10/14/2020 1513    Physical Exam:    VS:  BP (!) 162/74 (BP Location: Left Arm, Patient Position: Sitting, Cuff Size: Large)   Pulse 68   Ht '5\' 5"'$  (1.651 m)   Wt 196 lb (88.9 kg)   SpO2 98%   BMI 32.62 kg/m     Wt Readings from Last 3 Encounters:  12/07/20 196 lb (88.9 kg)  11/18/20 198 lb 12.8 oz (90.2 kg)  10/14/20 197 lb 12.8 oz (89.7 kg)     GEN:  Well nourished, well developed in no acute distress HEENT: Normal NECK: No JVD; No carotid bruits LYMPHATICS: No lymphadenopathy CARDIAC: [id number] systolic murmur RESPIRATORY:  Clear to auscultation without rales, wheezing or rhonchi  ABDOMEN: Soft, non-tender,  non-distended MUSCULOSKELETAL:  Trace edema; No deformity  SKIN: Warm and dry NEUROLOGIC:  Alert and oriented x 3 PSYCHIATRIC:  Normal affect   ASSESSMENT:    1. Coronary artery disease of native artery of native heart with stable angina pectoris ([place])   2. Abnormal cardiac CT angiography   3. Pre-procedure lab exam   4. Heart murmur   5. Renal infarct ([place])   6. Essential hypertension   7. Hyperlipidemia, unspecified hyperlipidemia type   8. Daytime somnolence     PLAN:    CAD: Reports atypical chest pain, no clear relationship with exertion but does appear to be correlated to stress.  Coronary CTA on 11/26/2020 showed moderate CAD in mid and distal RCA, calcium score 936 (97th percentile).  CT FFR suggested total occlusion in mid to distal RCA and indeterminate FFR value in distal LAD that may be due to vessel tapering of small caliber distal vessel stenosis. -Recommend cardiac catheterization.  Risks and benefits of cardiac catheterization have been discussed with the patient.  These include bleeding, infection, kidney damage, stroke, heart attack, death.  The patient understands these risks and is willing to proceed. -Continue aspirin 81 mg daily -Continue atorvastatin 80 mg daily -Increase carvedilol to 25 mg twice daily -As needed SL NTG  Heart murmur: 2 out of 6 systolic murmur on exam, will check echocardiogram  Renal infarct: Presented with renal infarct in 2017.  TEE showed possible aortic valve vegetation versus Lambls excrescence.  Blood cultures negative.  Completed 6 months of warfarin.  Has not had recurrent evidence of embolic events -Check echocardiogram -Zio patch x2 weeks to evaluate for A. Fib  Hypertension: On carvedilol 12.5 mg twice daily, HCTZ 25 mg daily, losartan 50 mg daily.  BP elevated, will increase carvedilol to 25 mg twice daily.  Asked to check BP daily for next 2 weeks.  Schedule follow-up in pharmacy hypertension clinic in 2 weeks for further  medication titration  Hyperlipidemia: On atorvastatin 40 mg daily,  LDL 109 on 10/14/2020.  Atorvastatin increased to 80 mg daily  Daytime somnolence: Check sleep study.  Epworth score 16   RTC in 6 to 8 weeks  Medication Adjustments/Labs and Tests Ordered: Current medicines are reviewed at length with the patient today.  Concerns regarding medicines are outlined above.  Orders Placed This Encounter  Procedures   Basic metabolic panel   CBC   EKG 12-Lead     Meds ordered this encounter  Medications   carvedilol (COREG) 25 MG tablet    Sig: Take 1 tablet (25 mg total) by mouth 2 (two) times daily with a meal.    Dispense:  180 tablet    Refill:  3    Dose increase   nitroGLYCERIN (NITROSTAT) 0.4 MG SL tablet    Sig: Place 1 tablet (0.4 mg total) under the tongue every 5 (five) minutes as needed.    Dispense:  25 tablet    Refill:  3      Patient Instructions  Medication Instructions:  INCREASE carvedilol (Coreg) to 25 mg two times daily  Take sublingual nitroglycerin AS NEEDED for chest pain  *If you need a refill on your cardiac medications before your next appointment, please call your pharmacy*   Lab Work: BMET, CBC today  If you have labs (blood work) drawn today and your tests are completely normal, you will receive your results only by: [place] (if you have MyChart) OR A paper copy in the mail If you have any lab test that is abnormal or we need to change your treatment, we will call you to review the results.   Testing/Procedures: Your physician has requested that you have a cardiac catheterization. Cardiac catheterization is used to diagnose and/or treat various heart conditions. Doctors may recommend this procedure for a number of different reasons. The most common reason is to evaluate chest pain. Chest pain can be a symptom of coronary artery disease (CAD), and cardiac catheterization can show whether plaque is narrowing or blocking your heart's  arteries. This procedure is also used to evaluate the valves, as well as measure the blood flow and oxygen levels in different parts of your heart. For further information please visit [email or web address]. Please follow instruction sheet, as given.  Follow-Up: At [facility], you and your health needs are our priority.  As part of our continuing mission to provide you with exceptional heart care, we have created designated Provider Care Teams.  These Care Teams include your primary Cardiologist (physician) and Advanced Practice Providers (APPs -  Physician Assistants and Nurse Practitioners) who all work together to provide you with the care you need, when you need it.  We recommend signing up for the patient portal called "MyChart".  Sign up information is provided on this After Visit Summary.  MyChart is used to connect with patients for Virtual Visits (Telemedicine).  Patients are able to view lab/test results, encounter notes, upcoming appointments, etc.  Non-urgent messages can be sent to your provider as well.   To learn more about what you can do with MyChart, go to [email or web address].    Your next appointment:   2 weeks with pharmacist  6-8 weeks with Dr. Gardiner Rhyme  Other Instructions Please call tomorrow to let us know what day is okay to schedule heart catheterization   [place] [address] Dept: [phone] Loc: [phone]  [name]  12/07/2020  You are scheduled for a ____ on _____  with Dr. _______.  1. Please arrive at the [facility] (Main Entrance A) at [facility]: [address] [place], [place] [address] at  ______ (This time is two hours before your procedure to ensure your preparation). Free valet parking service is available.   Special note: Every effort is made to have your procedure done on time. Please understand that emergencies  sometimes delay scheduled procedures.  2. Diet: Do not eat solid foods after midnight.  The patient may have clear liquids until 5am upon the day of the procedure.  3. Labs: Today in office   4. Medication instructions in preparation for your procedure:  Hold Losartan day before and AM of procedure Hold HCTZ AM of procedure    Contrast Allergy: No  On the morning of your procedure, take your Aspirin and any morning medicines NOT listed above.  You may use sips of water.  5. Plan for one night stay--bring personal belongings. 6. Bring a current list of your medications and current insurance cards. 7. You MUST have a responsible person to drive you home. 8. Someone MUST be with you the first 24 hours after you arrive home or your discharge will be delayed. 9. Please wear clothes that are easy to get on and off and wear slip-on shoes.  Thank you for allowing [place] to care for you!   -- [facility] Invasive Cardiovascular services    Signed, Donato Heinz, MD  12/09/2020 1:41 PM    [place]

## 2020-12-07 ENCOUNTER — Other Ambulatory Visit: Payer: Self-pay

## 2020-12-07 ENCOUNTER — Ambulatory Visit (INDEPENDENT_AMBULATORY_CARE_PROVIDER_SITE_OTHER): Payer: Medicare Other | Admitting: Cardiology

## 2020-12-07 ENCOUNTER — Encounter: Payer: Self-pay | Admitting: Cardiology

## 2020-12-07 ENCOUNTER — Ambulatory Visit: Payer: Medicare Other

## 2020-12-07 VITALS — BP 162/74 | HR 68 | Ht 65.0 in | Wt 196.0 lb

## 2020-12-07 DIAGNOSIS — R931 Abnormal findings on diagnostic imaging of heart and coronary circulation: Secondary | ICD-10-CM

## 2020-12-07 DIAGNOSIS — R011 Cardiac murmur, unspecified: Secondary | ICD-10-CM

## 2020-12-07 DIAGNOSIS — I25118 Atherosclerotic heart disease of native coronary artery with other forms of angina pectoris: Secondary | ICD-10-CM | POA: Diagnosis not present

## 2020-12-07 DIAGNOSIS — I10 Essential (primary) hypertension: Secondary | ICD-10-CM

## 2020-12-07 DIAGNOSIS — R400 Somnolence: Secondary | ICD-10-CM

## 2020-12-07 DIAGNOSIS — Z01812 Encounter for preprocedural laboratory examination: Secondary | ICD-10-CM

## 2020-12-07 DIAGNOSIS — E785 Hyperlipidemia, unspecified: Secondary | ICD-10-CM

## 2020-12-07 DIAGNOSIS — I2510 Atherosclerotic heart disease of native coronary artery without angina pectoris: Secondary | ICD-10-CM

## 2020-12-07 DIAGNOSIS — Z01818 Encounter for other preprocedural examination: Secondary | ICD-10-CM

## 2020-12-07 DIAGNOSIS — N280 Ischemia and infarction of kidney: Secondary | ICD-10-CM

## 2020-12-07 MED ORDER — CARVEDILOL 25 MG PO TABS: 25.0000 mg | ORAL_TABLET | Freq: Two times a day (BID) | ORAL | 3 refills | Status: DC

## 2020-12-07 MED ORDER — NITROGLYCERIN 0.4 MG SL SUBL: 0.4000 mg | SUBLINGUAL_TABLET | SUBLINGUAL | 3 refills | Status: DC | PRN

## 2020-12-07 NOTE — Patient Instructions (Addendum)
Medication Instructions:  INCREASE carvedilol (Coreg) to 25 mg two times daily  Take sublingual nitroglycerin AS NEEDED for chest pain  *If you need a refill on your cardiac medications before your next appointment, please call your pharmacy*   Lab Work: BMET, CBC today  If you have labs (blood work) drawn today and your tests are completely normal, you will receive your results only by: [place] (if you have MyChart) OR A paper copy in the mail If you have any lab test that is abnormal or we need to change your treatment, we will call you to review the results.   Testing/Procedures: Your physician has requested that you have a cardiac catheterization. Cardiac catheterization is used to diagnose and/or treat various heart conditions. Doctors may recommend this procedure for a number of different reasons. The most common reason is to evaluate chest pain. Chest pain can be a symptom of coronary artery disease (CAD), and cardiac catheterization can show whether plaque is narrowing or blocking your heart's arteries. This procedure is also used to evaluate the valves, as well as measure the blood flow and oxygen levels in different parts of your heart. For further information please visit [email or web address].[email or web address]. Please follow instruction sheet, as given.  Follow-Up: At [facility], you and your health needs are our priority.  As part of our continuing mission to provide you with exceptional heart care, we have created designated Provider Care Teams.  These Care Teams include your primary Cardiologist (physician) and Advanced Practice Providers (APPs -  Physician Assistants and Nurse Practitioners) who all work together to provide you with the care you need, when you need it.  We recommend signing up for the patient portal called "MyChart".  Sign up information is provided on this After Visit Summary.  MyChart is used to connect with patients for Virtual Visits (Telemedicine).  Patients are  able to view lab/test results, encounter notes, upcoming appointments, etc.  Non-urgent messages can be sent to your provider as well.   To learn more about what you can do with MyChart, go to [email or web address].    Your next appointment:   2 weeks with pharmacist  6-8 weeks with Dr. Gardiner Rhyme  Other Instructions Please call tomorrow to let us know what day is okay to schedule heart catheterization   [place] [address] Dept: [phone] Loc: [phone]  Monserrate Warneke  12/07/2020  You are scheduled for a ____ on _____  with Dr. _______.  1. Please arrive at the [facility] (Main Entrance A) at [facility]: [address] [place], [place] [address] at ______ (This time is two hours before your procedure to ensure your preparation). Free valet parking service is available.   Special note: Every effort is made to have your procedure done on time. Please understand that emergencies sometimes delay scheduled procedures.  2. Diet: Do not eat solid foods after midnight.  The patient may have clear liquids until 5am upon the day of the procedure.  3. Labs: Today in office   4. Medication instructions in preparation for your procedure:  Hold Losartan day before and AM of procedure Hold HCTZ AM of procedure    Contrast Allergy: No  On the morning of your procedure, take your Aspirin and any morning medicines NOT listed above.  You may use sips of water.  5. Plan for one night stay--bring personal belongings. 6. Bring a current list of your  medications and current insurance cards. 7. You MUST have a responsible person to drive you home. 8. Someone MUST be with you the first 24 hours after you arrive home or your discharge will be delayed. 9. Please wear clothes that are easy to get on and off and wear slip-on shoes.  Thank you for allowing [place] to care for  you!   -- [place] Invasive Cardiovascular services

## 2020-12-08 ENCOUNTER — Telehealth: Payer: Self-pay | Admitting: Cardiology

## 2020-12-08 LAB — CBC
Hematocrit: 36.7 % (ref 34.0–46.6)
Hemoglobin: 12.5 g/dL (ref 11.1–15.9)
MCH: 31.3 pg (ref 26.6–33.0)
MCHC: 34.1 g/dL (ref 31.5–35.7)
MCV: 92 fL (ref 79–97)
Platelets: 303 10*3/uL (ref 150–450)
RBC: 4 x10E6/uL (ref 3.77–5.28)
RDW: 12.5 % (ref 11.7–15.4)
WBC: 10.4 10*3/uL (ref 3.4–10.8)

## 2020-12-08 LAB — BASIC METABOLIC PANEL
BUN/Creatinine Ratio: 17 (ref 12–28)
BUN: 18 mg/dL (ref 8–27)
CO2: 22 mmol/L (ref 20–29)
Calcium: 9.7 mg/dL (ref 8.7–10.3)
Chloride: 102 mmol/L (ref 96–106)
Creatinine, Ser: 1.07 mg/dL — ABNORMAL HIGH (ref 0.57–1.00)
Glucose: 88 mg/dL (ref 65–99)
Potassium: 4.2 mmol/L (ref 3.5–5.2)
Sodium: 141 mmol/L (ref 134–144)
eGFR: 56 mL/min/{1.73_m2} — ABNORMAL LOW (ref >59)

## 2020-12-08 NOTE — Telephone Encounter (Signed)
Cardiac cath scheduled for Monday 9/19 Left message for patient to call back to discuss   [place] [address] Dept: [phone] Loc: [phone]  Carmeleta Piela  12/08/2020  You are scheduled for a Cardiac Catheterization on Monday, September 19 with Dr. Glenetta Hew.  1. Please arrive at the [facility] (Main Entrance A) at [facility]: [address] [place], [place] [address] at 5:30 AM (This time is two hours before your procedure to ensure your preparation). Free valet parking service is available.   Special note: Every effort is made to have your procedure done on time. Please understand that emergencies sometimes delay scheduled procedures.  2. Diet: Do not eat solid foods after midnight.  The patient may have clear liquids until 5am upon the day of the procedure.  3. Labs: completed 9/13  4. Medication instructions in preparation for your procedure:   Contrast Allergy: No  Hold Losartan the day before and day of procedure  Hold hydrochlorothiazide (HCTZ) am of procedure   On the morning of your procedure, take your Aspirin and any morning medicines NOT listed above.  You may use sips of water.  5. Plan for one night stay--bring personal belongings. 6. Bring a current list of your medications and current insurance cards. 7. You MUST have a responsible person to drive you home. 8. Someone MUST be with you the first 24 hours after you arrive home or your discharge will be delayed. 9. Please wear clothes that are easy to get on and off and wear slip-on shoes.  Thank you for allowing [place] to care for you!   --  Invasive Cardiovascular services

## 2020-12-08 NOTE — Telephone Encounter (Signed)
Donato Heinz, MD  12/08/2020  6:55 AM EDT     Stable labs  The patient has been notified of the result and verbalized understanding.  All questions (if any) were answered. Antonieta Iba, RN 12/08/2020 12:35 PM

## 2020-12-08 NOTE — Telephone Encounter (Signed)
Follow Up:   Patient said she received her lab results, but she did not understand it. Would you call and go over this with her please?

## 2020-12-09 ENCOUNTER — Encounter: Payer: Self-pay | Admitting: *Deleted

## 2020-12-09 NOTE — Telephone Encounter (Signed)
Lm to call back ./cy 

## 2020-12-09 NOTE — Telephone Encounter (Signed)
Attempted to contact to go over Cath instructions. Left message for patient to call back to office when able.

## 2020-12-09 NOTE — Telephone Encounter (Signed)
Patient is returning call.  °

## 2020-12-10 ENCOUNTER — Encounter: Payer: Self-pay | Admitting: *Deleted

## 2020-12-10 ENCOUNTER — Other Ambulatory Visit: Payer: Self-pay | Admitting: *Deleted

## 2020-12-10 ENCOUNTER — Ambulatory Visit (HOSPITAL_COMMUNITY): Payer: Medicare Other | Attending: Cardiology

## 2020-12-10 ENCOUNTER — Other Ambulatory Visit: Payer: Self-pay

## 2020-12-10 ENCOUNTER — Telehealth: Payer: Self-pay | Admitting: Cardiology

## 2020-12-10 DIAGNOSIS — R011 Cardiac murmur, unspecified: Secondary | ICD-10-CM | POA: Insufficient documentation

## 2020-12-10 DIAGNOSIS — R079 Chest pain, unspecified: Secondary | ICD-10-CM | POA: Diagnosis not present

## 2020-12-10 DIAGNOSIS — R931 Abnormal findings on diagnostic imaging of heart and coronary circulation: Secondary | ICD-10-CM

## 2020-12-10 MED ORDER — PERFLUTREN LIPID MICROSPHERE
1.0000 mL | INTRAVENOUS | Status: AC | PRN
  Administered 2020-12-10: 1 mL via INTRAVENOUS

## 2020-12-10 MED ORDER — SODIUM CHLORIDE 0.9% FLUSH: 3.0000 mL | Freq: Two times a day (BID) | INTRAVENOUS | Status: DC

## 2020-12-10 NOTE — Telephone Encounter (Signed)
Returned call to patient she stated she would like time changed for cardiac cath scheduled for Mon 9/19.Stated her son has to come from [place] to take her and 5:30 am is too early.Advised I will send message to Dr.Schumann's RN.

## 2020-12-10 NOTE — Telephone Encounter (Signed)
Left message to call back  

## 2020-12-10 NOTE — Telephone Encounter (Signed)
Patient returning call. She states the time for her cath will not work for her.

## 2020-12-10 NOTE — Telephone Encounter (Signed)
Follow up appointments scheduled. Left message for patient to call back

## 2020-12-10 NOTE — Telephone Encounter (Signed)
Spoke to patient, she states she is unable to get to the hospital at 530AM, requesting later time.    Called cath lab-rescheduled to 9AM case.  Arrival time 7am   Discussed with patient, also instructions discussed as well.  Patient verbalized understanding.

## 2020-12-10 NOTE — Telephone Encounter (Signed)
Patient wants to know what medications should she take and any other instructions before having her cath on Monday

## 2020-12-10 NOTE — Telephone Encounter (Signed)
Attempt to call patient, no answer.  Discussed instructions with patient earlier today.  Will send instructions again via Mychart.

## 2020-12-11 LAB — ECHOCARDIOGRAM COMPLETE
Area-P 1/2: 3.51 cm2
S' Lateral: 2.3 cm

## 2020-12-13 ENCOUNTER — Encounter (HOSPITAL_COMMUNITY)
Admission: RE | Disposition: A | Discharge status: Home / Self Care | Payer: Self-pay | Source: Home / Self Care | Attending: Cardiology

## 2020-12-13 ENCOUNTER — Ambulatory Visit (HOSPITAL_COMMUNITY)
Admission: RE | Admit: 2020-12-13 | Discharge: 2020-12-13 | Disposition: A | Discharge status: Home / Self Care | Payer: Medicare Other | Attending: Cardiology | Admitting: Cardiology

## 2020-12-13 ENCOUNTER — Other Ambulatory Visit: Payer: Self-pay

## 2020-12-13 ENCOUNTER — Encounter (HOSPITAL_COMMUNITY): Payer: Self-pay | Admitting: Internal Medicine

## 2020-12-13 DIAGNOSIS — R079 Chest pain, unspecified: Secondary | ICD-10-CM

## 2020-12-13 DIAGNOSIS — Z79899 Other long term (current) drug therapy: Secondary | ICD-10-CM | POA: Diagnosis not present

## 2020-12-13 DIAGNOSIS — Z87891 Personal history of nicotine dependence: Secondary | ICD-10-CM | POA: Insufficient documentation

## 2020-12-13 DIAGNOSIS — Z91040 Latex allergy status: Secondary | ICD-10-CM | POA: Insufficient documentation

## 2020-12-13 DIAGNOSIS — Z7982 Long term (current) use of aspirin: Secondary | ICD-10-CM | POA: Insufficient documentation

## 2020-12-13 DIAGNOSIS — Z8249 Family history of ischemic heart disease and other diseases of the circulatory system: Secondary | ICD-10-CM | POA: Insufficient documentation

## 2020-12-13 DIAGNOSIS — R931 Abnormal findings on diagnostic imaging of heart and coronary circulation: Secondary | ICD-10-CM | POA: Diagnosis not present

## 2020-12-13 DIAGNOSIS — R011 Cardiac murmur, unspecified: Secondary | ICD-10-CM | POA: Insufficient documentation

## 2020-12-13 DIAGNOSIS — I25118 Atherosclerotic heart disease of native coronary artery with other forms of angina pectoris: Secondary | ICD-10-CM | POA: Diagnosis not present

## 2020-12-13 DIAGNOSIS — R0789 Other chest pain: Secondary | ICD-10-CM

## 2020-12-13 DIAGNOSIS — E785 Hyperlipidemia, unspecified: Secondary | ICD-10-CM | POA: Diagnosis not present

## 2020-12-13 DIAGNOSIS — Z880 Allergy status to penicillin: Secondary | ICD-10-CM | POA: Insufficient documentation

## 2020-12-13 DIAGNOSIS — Z91013 Allergy to seafood: Secondary | ICD-10-CM | POA: Diagnosis not present

## 2020-12-13 DIAGNOSIS — R400 Somnolence: Secondary | ICD-10-CM | POA: Insufficient documentation

## 2020-12-13 DIAGNOSIS — I10 Essential (primary) hypertension: Secondary | ICD-10-CM | POA: Diagnosis not present

## 2020-12-13 HISTORY — PX: LEFT HEART CATH AND CORONARY ANGIOGRAPHY: CATH118249

## 2020-12-13 HISTORY — DX: Other chest pain: R07.89

## 2020-12-13 SURGERY — LEFT HEART CATH AND CORONARY ANGIOGRAPHY
Anesthesia: LOCAL

## 2020-12-13 MED ORDER — ACETAMINOPHEN 325 MG PO TABS: 650.0000 mg | ORAL_TABLET | ORAL | Status: DC | PRN

## 2020-12-13 MED ORDER — SODIUM CHLORIDE 0.9% FLUSH: 3.0000 mL | INTRAVENOUS | Status: DC | PRN

## 2020-12-13 MED ORDER — SODIUM CHLORIDE 0.9 % WEIGHT BASED INFUSION: 1.0000 mL/kg/h | INTRAVENOUS | Status: DC

## 2020-12-13 MED ORDER — ISOSORBIDE MONONITRATE ER 30 MG PO TB24: 15.0000 mg | ORAL_TABLET | Freq: Every day | ORAL | 5 refills | Status: DC

## 2020-12-13 MED ORDER — SODIUM CHLORIDE 0.9 % WEIGHT BASED INFUSION
3.0000 mL/kg/h | INTRAVENOUS | Status: AC
  Administered 2020-12-13: 3 mL/kg/h via INTRAVENOUS

## 2020-12-13 MED ORDER — ASPIRIN 81 MG PO CHEW
CHEWABLE_TABLET | ORAL | Status: AC
  Administered 2020-12-13: 81 mg via ORAL
  Filled 2020-12-13: qty 1

## 2020-12-13 MED ORDER — FUROSEMIDE 40 MG PO TABS: 40.0000 mg | ORAL_TABLET | Freq: Every day | ORAL | 5 refills | Status: DC

## 2020-12-13 MED ORDER — LIDOCAINE HCL (PF) 1 % IJ SOLN
INTRAMUSCULAR | Status: AC
  Filled 2020-12-13: qty 30

## 2020-12-13 MED ORDER — ONDANSETRON HCL 4 MG/2ML IJ SOLN: 4.0000 mg | Freq: Four times a day (QID) | INTRAMUSCULAR | Status: DC | PRN

## 2020-12-13 MED ORDER — SODIUM CHLORIDE 0.9 % IV SOLN: 250.0000 mL | INTRAVENOUS | Status: DC | PRN

## 2020-12-13 MED ORDER — VERAPAMIL HCL 2.5 MG/ML IV SOLN
INTRAVENOUS | Status: DC | PRN
  Administered 2020-12-13: 10 mL via INTRA_ARTERIAL

## 2020-12-13 MED ORDER — LIDOCAINE HCL (PF) 1 % IJ SOLN
INTRAMUSCULAR | Status: DC | PRN
  Administered 2020-12-13: 2 mL

## 2020-12-13 MED ORDER — HYDRALAZINE HCL 20 MG/ML IJ SOLN: 10.0000 mg | INTRAMUSCULAR | Status: DC | PRN

## 2020-12-13 MED ORDER — ASPIRIN 81 MG PO CHEW: 81.0000 mg | CHEWABLE_TABLET | ORAL | Status: AC

## 2020-12-13 MED ORDER — VERAPAMIL HCL 2.5 MG/ML IV SOLN
INTRAVENOUS | Status: AC
  Filled 2020-12-13: qty 2

## 2020-12-13 MED ORDER — HEPARIN SODIUM (PORCINE) 1000 UNIT/ML IJ SOLN
INTRAMUSCULAR | Status: AC
  Filled 2020-12-13: qty 1

## 2020-12-13 MED ORDER — HEPARIN (PORCINE) IN NACL 1000-0.9 UT/500ML-% IV SOLN
INTRAVENOUS | Status: DC | PRN
  Administered 2020-12-13 (×2): 500 mL

## 2020-12-13 MED ORDER — HEPARIN (PORCINE) IN NACL 1000-0.9 UT/500ML-% IV SOLN
INTRAVENOUS | Status: AC
  Filled 2020-12-13: qty 1000

## 2020-12-13 MED ORDER — HEPARIN SODIUM (PORCINE) 1000 UNIT/ML IJ SOLN
INTRAMUSCULAR | Status: DC | PRN
  Administered 2020-12-13: 4500 [IU] via INTRAVENOUS

## 2020-12-13 MED ORDER — MIDAZOLAM HCL 2 MG/2ML IJ SOLN
INTRAMUSCULAR | Status: AC
  Filled 2020-12-13: qty 2

## 2020-12-13 MED ORDER — MIDAZOLAM HCL 2 MG/2ML IJ SOLN
INTRAMUSCULAR | Status: DC | PRN
  Administered 2020-12-13: 1 mg via INTRAVENOUS

## 2020-12-13 MED ORDER — FENTANYL CITRATE (PF) 100 MCG/2ML IJ SOLN
INTRAMUSCULAR | Status: DC | PRN
  Administered 2020-12-13: 25 ug via INTRAVENOUS

## 2020-12-13 MED ORDER — FENTANYL CITRATE (PF) 100 MCG/2ML IJ SOLN
INTRAMUSCULAR | Status: AC
  Filled 2020-12-13: qty 2

## 2020-12-13 MED ORDER — SODIUM CHLORIDE 0.9% FLUSH: 3.0000 mL | Freq: Two times a day (BID) | INTRAVENOUS | Status: DC

## 2020-12-13 MED ORDER — LABETALOL HCL 5 MG/ML IV SOLN: 10.0000 mg | INTRAVENOUS | Status: DC | PRN

## 2020-12-13 SURGICAL SUPPLY — 12 items
CATH 5FR JL3.5 JR4 ANG PIG MP (CATHETERS) ×1 IMPLANT
CATH INFINITI 5 FR MPA2 (CATHETERS) ×1 IMPLANT
CATH LAUNCHER 5F EBU3.0 (CATHETERS) IMPLANT
CATHETER LAUNCHER 5F EBU3.0 (CATHETERS) ×2
DEVICE RAD COMP TR BAND LRG (VASCULAR PRODUCTS) ×1 IMPLANT
GLIDESHEATH SLEND SS 6F .021 (SHEATH) ×1 IMPLANT
GUIDEWIRE INQWIRE 1.5J.035X260 (WIRE) IMPLANT
INQWIRE 1.5J .035X260CM (WIRE) ×2
KIT HEART LEFT (KITS) ×2 IMPLANT
PACK CARDIAC CATHETERIZATION (CUSTOM PROCEDURE TRAY) ×2 IMPLANT
TRANSDUCER W/STOPCOCK (MISCELLANEOUS) ×2 IMPLANT
TUBING CIL FLEX 10 FLL-RA (TUBING) ×2 IMPLANT

## 2020-12-13 NOTE — Interval H&P Note (Signed)
History and Physical Interval Note:  12/13/2020 8:47 AM  [name]  has presented today for surgery, with the diagnosis of atypical chest pain and atypical chest pain.  The various methods of treatment have been discussed with the patient and family. After consideration of risks, benefits and other options for treatment, the patient has consented to  Procedure(s): LEFT HEART CATH AND CORONARY ANGIOGRAPHY (N/A) as a surgical intervention.  The patient's history has been reviewed, patient examined, no change in status, stable for surgery.  I have reviewed the patient's chart and labs.  Questions were answered to the patient's satisfaction.    Cath Lab Visit (complete for each Cath Lab visit)  Clinical Evaluation Leading to the Procedure:   ACS: No.  Non-ACS:    Anginal Classification: CCS IV  Anti-ischemic medical therapy: Minimal Therapy (1 class of medications)  Non-Invasive Test Results: Intermediate-risk stress test findings: cardiac mortality 1-3%/year (abnormal cardiac CTA with possible CTO of mid RCA and positive CTFFR in distal LAD).  Prior CABG: No previous CABG  Jazzman Loughmiller

## 2020-12-29 ENCOUNTER — Other Ambulatory Visit: Payer: Self-pay

## 2020-12-29 ENCOUNTER — Ambulatory Visit (INDEPENDENT_AMBULATORY_CARE_PROVIDER_SITE_OTHER): Payer: Medicare Other | Admitting: Pharmacist Clinician (PhC)/ Clinical Pharmacy Specialist

## 2020-12-29 VITALS — BP 146/78 | HR 74 | Resp 17 | Ht 65.0 in | Wt 200.0 lb

## 2020-12-29 DIAGNOSIS — I10 Essential (primary) hypertension: Secondary | ICD-10-CM

## 2020-12-29 MED ORDER — LOSARTAN POTASSIUM 100 MG PO TABS: 100.0000 mg | ORAL_TABLET | Freq: Every day | ORAL | 3 refills | Status: DC

## 2020-12-29 NOTE — Assessment & Plan Note (Signed)
Patient with essential hypertension, still not at BP goal.  Will increase losartan to 100 mg daily and have her continue to monitor home BP.  She will need BMET in 2 weeks and has a follow up appointment with Dr. Gardiner Rhyme in one month.

## 2020-12-29 NOTE — Progress Notes (Signed)
05/25/2021 [name] April 30, 1950 [id number]   HPI:  [name] is a 70 y.o. female patient of Dr Gardiner Rhyme, with a PMH below who presents today for hypertension clinic evaluation.She was referred to cardiology because of chest pain.  A coronary CT showed moderate CAD and a follow up heart catheterization showed a subtotal occlusion of the RCA and mild occlusion of LAD.  She was started on isosorbide and hctz was switched to furosemide.    Today she is in the office to discuss blood pressure control.  She has no side effects or complaints about her medications and states no issues with compliance.    Past Medical History: CAD Coronary calcium score 936 (97th percentile)  hyperlipidemia 7/22 LDL 109, now on atorvastatin 80 mg  Renal infarct 2017 MRI and CT found 2 wedge shaped areas of hypoperfusion     Blood Pressure Goal:  130/80  Current Medications: carvedilol 25 bid, losartan 50 mg qd - pm, furosemide 40 mg qd, isosorbide 15 mg qd  Family Hx: both parents had hypertension, mother had pacemaker, died last year at 75, paternal GF lived to 49, father died in his 62's; 1 sister with hypertension, COPD, deceased, other sister with htn but well controlled, does have gout; 2 children both with hypertension, one with AF  Social Hx: no current tobacco, quit 1 year; no alcohol, 1 coffee per day  Diet: more at home, had cut out fast food recently as well as added salt; mix of meats and vegetables;   Exercise: does therapy exercises most days, walks 10-15 minutes as well  Home BP readings:  157/76 yesterday, mostly 150's was higher previously; diastolic mostly in 14'N  Intolerances: no cardiac medication intolerances  Labs: 9/22  Na 141, K 4.2, Glu 88, BUN 18, SCr 1.07, GFR 56     Wt Readings from Last 3 Encounters:  05/04/21 196 lb 6 oz (89.1 kg)  04/28/21 198 lb (89.8 kg)  03/29/21 196 lb 3.2 oz (89 kg)   BP Readings from Last 3 Encounters:  05/04/21 (!) 145/71  04/28/21  (!) 150/76  04/06/21 (!) 203/83   Pulse Readings from Last 3 Encounters:  05/04/21 75  04/28/21 70  04/06/21 72    Current Outpatient Medications  Medication Sig Dispense Refill   aspirin EC 81 MG tablet Take 1 tablet (81 mg total) daily by mouth. 30 tablet 11   atorvastatin (LIPITOR) 80 MG tablet Take 1 tablet (80 mg total) by mouth daily at 6 PM. (Patient taking differently: Take 40 mg by mouth daily at 6 PM.) 90 tablet 3   carvedilol (COREG) 25 MG tablet Take 1 tablet (25 mg total) by mouth 2 (two) times daily with a meal. 180 tablet 3   furosemide (LASIX) 40 MG tablet Take 1 tablet (40 mg total) by mouth daily. 30 tablet 5   gabapentin (NEURONTIN) 300 MG capsule Take 300 mg by mouth 2 (two) times daily.     isosorbide mononitrate (IMDUR) 30 MG 24 hr tablet Take 0.5 tablets (15 mg total) by mouth daily. 30 tablet 5   losartan (COZAAR) 100 MG tablet Take 1 tablet (100 mg total) by mouth daily. 90 tablet 3   Multiple Vitamin (MULITIVITAMIN WITH MINERALS) TABS Take 1 tablet by mouth daily.     potassium chloride SA (KLOR-CON) 20 MEQ tablet TAKE 1 TABLET(20 MEQ) BY MOUTH DAILY 90 tablet 3   triamcinolone cream (KENALOG) 0.1 % Apply 1 application topically daily as needed (Itching).  amLODipine (NORVASC) 10 MG tablet Take 1 tablet (10 mg total) by mouth daily. 90 tablet 1   estradiol (ESTRACE VAGINAL) 0.1 MG/GM vaginal cream Place 1 Applicatorful vaginally at bedtime. 42.5 g 2   fluticasone (FLONASE) 50 MCG/ACT nasal spray Place 2 sprays into both nostrils 2 (two) times daily. 18.2 mL 3   nitroGLYCERIN (NITROSTAT) 0.4 MG SL tablet Place 1 tablet (0.4 mg total) under the tongue every 5 (five) minutes as needed. 25 tablet 3   promethazine-dextromethorphan (PROMETHAZINE-DM) 6.25-15 MG/5ML syrup Take 5 mLs by mouth 2 (two) times daily as needed for cough. (Patient not taking: Reported on 04/28/2021) 100 mL 0   traMADol (ULTRAM) 50 MG tablet Take 1 tablet (50 mg total) by mouth 2 (two) times  daily as needed for moderate pain. (Patient not taking: Reported on 04/28/2021) 30 tablet 0   No current facility-administered medications for this visit.    Allergies  Allergen Reactions   Shrimp [Shellfish Allergy] Hives and Itching   Latex Itching   Penicillins Itching and Rash    Has patient had a PCN reaction causing immediate rash, facial/tongue/throat swelling, SOB or lightheadedness with hypotension: unknown Has patient had a PCN reaction causing severe rash involving mucus membranes or skin necrosis: unknown Has patient had a PCN reaction that required hospitalization unknown Has patient had a PCN reaction occurring within the last 10 years: No If all of the above answers are "NO", then may proceed with Cephalosporin use.    Past Medical History:  Diagnosis Date   Abnormal EKG 2012   hospitalized for T wave inversion in lateral leads with MSK chest pains, normal ECHO, negative trops, no cardiology consult. recent EKG 7/15 stable T wave inversions.    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   Arthritis 2000   Arthritis 2011   Clotting disorder ([place])    blood clot kidney 2017   Heart murmur    Hyperlipidemia 2011   Hypertension 2011   Meniscus tear 2000   L KNEE   Renal infarct ([place]) 2017   blood clot in the kidney    Blood pressure (!) 146/78, pulse 74, resp. rate 17, height 5\' 5"  (1.651 m), weight 200 lb (90.7 kg), SpO2 96 %.  Hypertension Patient with essential hypertension, still not at BP goal.  Will increase losartan to 100 mg daily and have her continue to monitor home BP.  She will need BMET in 2 weeks and has a follow up appointment with Dr. Gardiner Rhyme in one month.    Tommy Medal PharmD CPP [facility] [address] [place], [place] [address] [phone]

## 2020-12-29 NOTE — Patient Instructions (Signed)
Return for a a follow up appointment with Dr. Gardiner Rhyme in November  Go to the lab in 2 weeks to check kidney function  (week of October 17)  Check your blood pressure at home daily and keep record of the readings.  Take your BP meds as follows:  Increase losartan to 100 mg once daily in the evenings  Bring all of your meds, your BP cuff and your record of home blood pressures to your next appointment.  Exercise as you're able, try to walk approximately 30 minutes per day.  Keep salt intake to a minimum, especially watch canned and prepared boxed foods.  Eat more fresh fruits and vegetables and fewer canned items.  Avoid eating in fast food restaurants.    HOW TO TAKE YOUR BLOOD PRESSURE: Rest 5 minutes before taking your blood pressure.  Don't smoke or drink caffeinated beverages for at least 30 minutes before. Take your blood pressure before (not after) you eat. Sit comfortably with your back supported and both feet on the floor (don't cross your legs). Elevate your arm to heart level on a table or a desk. Use the proper sized cuff. It should fit smoothly and snugly around your bare upper arm. There should be enough room to slip a fingertip under the cuff. The bottom edge of the cuff should be 1 inch above the crease of the elbow. Ideally, take 3 measurements at one sitting and record the average.

## 2021-01-12 ENCOUNTER — Ambulatory Visit (HOSPITAL_BASED_OUTPATIENT_CLINIC_OR_DEPARTMENT_OTHER): Payer: Medicare Other | Attending: Cardiology | Admitting: Cardiovascular Disease

## 2021-01-12 ENCOUNTER — Other Ambulatory Visit: Payer: Self-pay

## 2021-01-12 DIAGNOSIS — I10 Essential (primary) hypertension: Secondary | ICD-10-CM | POA: Insufficient documentation

## 2021-01-12 DIAGNOSIS — R400 Somnolence: Secondary | ICD-10-CM | POA: Diagnosis not present

## 2021-01-12 DIAGNOSIS — G4733 Obstructive sleep apnea (adult) (pediatric): Secondary | ICD-10-CM | POA: Insufficient documentation

## 2021-01-17 ENCOUNTER — Other Ambulatory Visit: Payer: Self-pay

## 2021-01-17 ENCOUNTER — Encounter (HOSPITAL_COMMUNITY): Payer: Self-pay | Admitting: Emergency Medicine

## 2021-01-17 ENCOUNTER — Ambulatory Visit (HOSPITAL_COMMUNITY)
Admission: EM | Admit: 2021-01-17 | Discharge: 2021-01-17 | Disposition: A | Discharge status: Home / Self Care | Payer: Medicare Other | Attending: Sports Medicine | Admitting: Sports Medicine

## 2021-01-17 DIAGNOSIS — I10 Essential (primary) hypertension: Secondary | ICD-10-CM | POA: Diagnosis not present

## 2021-01-17 DIAGNOSIS — N898 Other specified noninflammatory disorders of vagina: Secondary | ICD-10-CM | POA: Insufficient documentation

## 2021-01-17 DIAGNOSIS — R300 Dysuria: Secondary | ICD-10-CM | POA: Diagnosis present

## 2021-01-17 LAB — POCT URINALYSIS DIPSTICK, ED / UC
Bilirubin Urine: NEGATIVE
Glucose, UA: NEGATIVE mg/dL
Hgb urine dipstick: NEGATIVE
Ketones, ur: NEGATIVE mg/dL
Leukocytes,Ua: NEGATIVE
Nitrite: NEGATIVE
Protein, ur: NEGATIVE mg/dL
Specific Gravity, Urine: 1.015 (ref 1.005–1.030)
Urobilinogen, UA: 0.2 mg/dL (ref 0.0–1.0)
pH: 5.5 (ref 5.0–8.0)

## 2021-01-17 MED ORDER — ESTRADIOL 0.1 MG/GM VA CREA: 1.0000 | TOPICAL_CREAM | Freq: Every day | VAGINAL | 2 refills | Status: DC

## 2021-01-17 MED ORDER — ESTRADIOL 0.1 MG/GM VA CREA: 1.0000 | TOPICAL_CREAM | Freq: Every day | VAGINAL | 12 refills | Status: DC

## 2021-01-17 NOTE — ED Provider Notes (Signed)
[facility]    CSN: [id number] Arrival date & time: 01/17/21  1628      History   Chief Complaint Chief Complaint  Patient presents with   Dysuria   Vaginal Itching    HPI [name] is a 70 y.o. female here for dysuria and vaginal itching.  Itching vaginal area Red around the vaginal area No vaginal discharge Itchy x 2 weeks Uses Dove soap, unscented When wears white, non-colored underwear it is better Not currently sexually active, non > 1 year No fever/chills No abdominal pain  Patient reports she has had a similar issue in the past and they told her it this was due to old age.  She does not recall getting any vaginal estradiol cream in the past.   Past Medical History:  Diagnosis Date   Abnormal EKG 2012   hospitalized for T wave inversion in lateral leads with MSK chest pains, normal ECHO, negative trops, no cardiology consult. recent EKG 7/15 stable T wave inversions.    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   Arthritis 2000   Arthritis 2011   Clotting disorder ([place])    blood clot kidney 2017   Heart murmur    Hyperlipidemia 2011   Hypertension 2011   Meniscus tear 2000   L KNEE   Renal infarct ([place]) 2017   blood clot in the kidney    Patient Active Problem List   Diagnosis Date Noted   Daytime somnolence 01/12/2021   Atypical chest pain 12/13/2020   Abnormal cardiac CT angiography 12/13/2020   Left elbow pain 03/31/2020   Contact dermatitis 01/14/2019   Vaginal itching 12/05/2018   Pelvic pain 10/07/2018   Leg edema 08/28/2018   Thickened endometrium    Umbilical hernia without obstruction and without gangrene 01/01/2018   Osteopenia 09/05/2017   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   Renal infarct ([place]) 09/30/2015   HLD (hyperlipidemia)    Aortic atherosclerosis ([place])    Back pain 07/08/2015   Scotoma of blind spot area in visual field 06/14/2015   Bilateral leg pain 06/14/2015   Seasonal allergies 03/09/2015   Hypopigmentation  03/09/2015   Osteoarthritis of left knee 07/08/2014   DJD (degenerative joint disease), lumbar 07/08/2014   Osteoarthritis of right knee 07/08/2014   Urinary frequency 06/11/2014   Healthcare maintenance 06/11/2014   Allergic rhinoconjunctivitis of both eyes 06/11/2014   Nearsightedness 04/27/2014   Abnormal EKG    Hypertension 12/11/2013    Past Surgical History:  Procedure Laterality Date   ABDOMINAL SURGERY  2010   for bowel blockage   COLON SURGERY  2010   bowel blockage   COLONOSCOPY  09/2014   HYSTEROSCOPY WITH D & C N/A 05/08/2018   Procedure: DILATATION AND CURETTAGE /HYSTEROSCOPY;  Surgeon: Mora Bellman, MD;  Location: [facility];  Service: Gynecology;  Laterality: N/A;   KNEE ARTHROSCOPY Left 09/25/2013   Procedure: LEFT KNEE ARTHROSCOPY WITH PARTIAL MEDIAL AND LATERAL  MENISCECTOMY/DEBRIDEMENT/MICRO FRACTURE MEDIAL FEMORAL CONDYLE, REMOVAL OF OSTEOCONDRAL FRAGMENT;  Surgeon: Johnn Hai, MD;  Location: [facility];  Service: Orthopedics;  Laterality: Left;   KNEE SURGERY Right 1999   LEFT HEART CATH AND CORONARY ANGIOGRAPHY N/A 12/13/2020   Procedure: LEFT HEART CATH AND CORONARY ANGIOGRAPHY;  Surgeon: Nelva Bush, MD;  Location: [facility];  Service: Cardiovascular;  Laterality: N/A;   POLYPECTOMY     TEE WITHOUT CARDIOVERSION N/A 10/04/2015   Procedure: TRANSESOPHAGEAL ECHOCARDIOGRAM (TEE);  Surgeon: Sueanne Margarita, MD;  Location: [facility];  Service: Cardiovascular;  Laterality: N/A;   TUBAL LIGATION  1978    OB History     Gravida  2   Para  2   Term      Preterm      AB      Living  2      SAB      IAB      Ectopic      Multiple      Live Births               Home Medications    Prior to Admission medications   Medication Sig Start Date End Date Taking? Authorizing Provider  aspirin EC 81 MG tablet Take 1 tablet (81 mg total) daily by mouth. 01/31/17   Clent Demark, PA-C  atorvastatin (LIPITOR) 80 MG  tablet Take 1 tablet (80 mg total) by mouth daily at 6 PM. Patient taking differently: Take 40 mg by mouth daily at 6 PM. 12/02/20   Donato Heinz, MD  carvedilol (COREG) 25 MG tablet Take 1 tablet (25 mg total) by mouth 2 (two) times daily with a meal. 12/07/20 12/02/21  Donato Heinz, MD  estradiol (ESTRACE VAGINAL) 0.1 MG/GM vaginal cream Place 1 Applicatorful vaginally at bedtime. 01/17/21   Elba Barman, DO  fluticasone (FLONASE) 50 MCG/ACT nasal spray SHAKE LIQUID AND USE 1 SPRAY IN EACH NOSTRIL EVERY DAY Patient taking differently: Place 2 sprays into both nostrils 2 (two) times daily. 11/22/20   Gifford Shave, MD  furosemide (LASIX) 40 MG tablet Take 1 tablet (40 mg total) by mouth daily. 12/13/20 12/13/21  End, Harrell Gave, MD  gabapentin (NEURONTIN) 300 MG capsule Take 300 mg by mouth 2 (two) times daily. 11/14/19   [provider]  isosorbide mononitrate (IMDUR) 30 MG 24 hr tablet Take 0.5 tablets (15 mg total) by mouth daily. 12/13/20 12/13/21  End, Harrell Gave, MD  losartan (COZAAR) 100 MG tablet Take 1 tablet (100 mg total) by mouth daily. 12/29/20   Donato Heinz, MD  Multiple Vitamin (MULITIVITAMIN WITH MINERALS) TABS Take 1 tablet by mouth daily.    [provider]  nitroGLYCERIN (NITROSTAT) 0.4 MG SL tablet Place 1 tablet (0.4 mg total) under the tongue every 5 (five) minutes as needed. 12/07/20 03/07/21  Donato Heinz, MD  potassium chloride SA (KLOR-CON) 20 MEQ tablet TAKE 1 TABLET(20 MEQ) BY MOUTH DAILY 07/16/20   Gifford Shave, MD  promethazine-dextromethorphan (PROMETHAZINE-DM) 6.25-15 MG/5ML syrup Take 5 mLs by mouth 4 (four) times daily as needed for cough. Patient not taking: Reported on 12/29/2020 11/29/20   Volney American, PA-C  traMADol (ULTRAM) 50 MG tablet Take 1 tablet (50 mg total) by mouth daily as needed. Patient not taking: No sig reported 11/29/20   Volney American, PA-C  triamcinolone cream (KENALOG)  0.1 % Apply 1 application topically daily as needed (Itching). 07/29/20   [provider]    Family History Family History  Problem Relation Age of Onset   Hypertension Mother    Heart disease Mother        has a pacemaker    Alzheimer's disease Mother    Hyperlipidemia Mother    Osteoporosis Mother    Heart failure Father    Hypertension Father    Alzheimer's disease Father    Hypertension Sister    Breast cancer Sister    Hypertension Sister    Gout Sister    Alcohol abuse Son    Pancreatitis Son  Cancer Neg Hx    Colon cancer Neg Hx    Esophageal cancer Neg Hx    Stomach cancer Neg Hx    Rectal cancer Neg Hx     Social History Social History   Tobacco Use   Smoking status: Former    Types: Cigarettes    Quit date: 07/29/2010    Years since quitting: 10.4   Smokeless tobacco: Never  Vaping Use   Vaping Use: Never used  Substance Use Topics   Alcohol use: Never    Alcohol/week: 0.0 standard drinks   Drug use: Never     Allergies   Shrimp [shellfish allergy], Latex, and Penicillins   Review of Systems Review of Systems  Constitutional:  Negative for chills and fever.  Genitourinary:  Positive for dysuria and vaginal pain (+ dryness, pain). Negative for flank pain, hematuria, vaginal bleeding and vaginal discharge.    Physical Exam Triage Vital Signs ED Triage Vitals  Enc Vitals Group     BP 01/17/21 1759 (!) 199/83     Pulse Rate 01/17/21 1759 67     Resp 01/17/21 1759 19     Temp 01/17/21 1759 97.9 F (36.6 C)     Temp Source 01/17/21 1759 Oral     SpO2 01/17/21 1759 98 %     Weight --      Height --      Head Circumference --      Peak Flow --      Pain Score 01/17/21 1757 7     Pain Loc --      Pain Edu? --      Excl. in [place]? --    No data found.  Updated Vital Signs BP (!) 179/75 (BP Location: Right Arm)   Pulse 67   Temp 97.9 F (36.6 C) (Oral)   Resp 19   SpO2 98%   Physical Exam Constitutional:      General: She is  not in acute distress.    Appearance: Normal appearance. She is not toxic-appearing.  HENT:     Head: Normocephalic and atraumatic.  Eyes:     Extraocular Movements: Extraocular movements intact.     Pupils: Pupils are equal, round, and reactive to light.  Abdominal:     General: Abdomen is flat. There is no distension.     Palpations: Abdomen is soft.     Tenderness: There is no abdominal tenderness.  Genitourinary:    Comments: + patient deferred GU exam, ok with self-swab for infection and f/u with OBGYN Skin:    General: Skin is warm.     Capillary Refill: Capillary refill takes less than 2 seconds.  Neurological:     Mental Status: She is alert.     UC Treatments / Results  Labs (all labs ordered are listed, but only abnormal results are displayed) Labs Reviewed  POCT URINALYSIS DIPSTICK, ED / UC  CERVICOVAGINAL ANCILLARY ONLY    EKG   Radiology No results found.  Procedures Procedures (including critical care time)  Medications Ordered in UC Medications - No data to display  Initial Impression / Assessment and Plan / UC Course  I have reviewed the triage vital signs and the nursing notes.  Pertinent labs & imaging results that were available during my care of the patient were reviewed by me and considered in my medical decision making (see chart for details).  Clinical Course as of 01/17/21 2001  Mon Jan 17, 2021  1825 BP(!): 199/83 [DB]  1825 Repeat BP: 179/75 [DB]    Clinical Course User Index [DB] Elba Barman, DO    Vaginal pain with pruritis Vaginal dryness, reported and history of Dysuria - negative POC urine UA HTN - acute on chronic  We discussed the patient's symptoms of vaginal itching and pain with dryness.  This is an acute on chronic issue for her.  Her UA was negative for infection.  Through shared decision-making, the patient elected to undergo self swab for yeast, BV, STI as opposed GU exam today by myself.  She has followed up with an  OB/GYN in the past and is comfortable having a GU exam at that time.  I feel this is reasonable, as she has a history of vaginal dryness in the past and her symptoms are most indicative of this.  We also will send off her self vaginal swab to ensure that she does not have yeast vaginitis or an additional yeast etiology, we will call with these results.  In the meantime, she may begin vaginal estradiol to use nightly.  I did discuss the importance of her following up with an OB/GYN for further management of this.  We discussed the avoidance of vaginal irritants as well.  She is agreeable to this plan and understanding.  We also discussed the importance of following up with her primary care physician for her elevated blood pressure.  Safe for discharge home.  Final diagnoses:  Vaginal itching  Vaginal dryness  Dysuria  Essential hypertension     Discharge Instructions      - Begin Estrace vaginal cream --> apply to outside of vagina (dry, red area) once nightly - We tested your vaginal swab for infection (yeast infection, etc.) --> if this is positive, we will call you and prescribe an antibiotic. For now, just use the vaginal cream. Your urine was negative for UTI/infection. - Follow-up with OBGYN for evaluation and further management - F/u with your PCP for high blood pressure      ED Prescriptions     Medication Sig Dispense Auth. Provider   estradiol (ESTRACE VAGINAL) 0.1 MG/GM vaginal cream  (Status: Discontinued) Place 1 Applicatorful vaginally at bedtime. 42.5 g Elba Barman, DO   estradiol (ESTRACE VAGINAL) 0.1 MG/GM vaginal cream Place 1 Applicatorful vaginally at bedtime. 42.5 g Elba Barman, DO      PDMP not reviewed this encounter.   Elba Barman, DO 01/17/21 2008

## 2021-01-17 NOTE — Discharge Instructions (Addendum)
-   Begin Estrace vaginal cream --> apply to outside of vagina (dry, red area) once nightly - We tested your vaginal swab for infection (yeast infection, etc.) --> if this is positive, we will call you and prescribe an antibiotic. For now, just use the vaginal cream. Your urine was negative for UTI/infection. - Follow-up with OBGYN for evaluation and further management - F/u with your PCP for high blood pressure

## 2021-01-17 NOTE — ED Triage Notes (Signed)
Pt c/o dysuria and vaginal itching for a week.

## 2021-01-18 LAB — CERVICOVAGINAL ANCILLARY ONLY
Bacterial Vaginitis (gardnerella): NEGATIVE
Candida Glabrata: POSITIVE — AB
Candida Vaginitis: NEGATIVE
Chlamydia: NEGATIVE
Comment: NEGATIVE
Comment: NEGATIVE
Comment: NEGATIVE
Comment: NEGATIVE
Comment: NORMAL
Neisseria Gonorrhea: NEGATIVE

## 2021-01-21 ENCOUNTER — Telehealth (HOSPITAL_COMMUNITY): Payer: Self-pay | Admitting: Emergency Medicine

## 2021-01-21 MED ORDER — FLUCONAZOLE 150 MG PO TABS: 150.0000 mg | ORAL_TABLET | Freq: Once | ORAL | 0 refills | Status: AC

## 2021-01-29 ENCOUNTER — Other Ambulatory Visit: Payer: Self-pay

## 2021-01-29 ENCOUNTER — Encounter (HOSPITAL_COMMUNITY): Payer: Self-pay

## 2021-01-29 ENCOUNTER — Ambulatory Visit (HOSPITAL_COMMUNITY)
Admission: EM | Admit: 2021-01-29 | Discharge: 2021-01-29 | Disposition: A | Discharge status: Home / Self Care | Payer: Medicare Other | Attending: Physician Assistant | Admitting: Physician Assistant

## 2021-01-29 DIAGNOSIS — J101 Influenza due to other identified influenza virus with other respiratory manifestations: Secondary | ICD-10-CM

## 2021-01-29 DIAGNOSIS — Z20822 Contact with and (suspected) exposure to covid-19: Secondary | ICD-10-CM | POA: Insufficient documentation

## 2021-01-29 DIAGNOSIS — R051 Acute cough: Secondary | ICD-10-CM | POA: Diagnosis present

## 2021-01-29 DIAGNOSIS — Z87891 Personal history of nicotine dependence: Secondary | ICD-10-CM | POA: Insufficient documentation

## 2021-01-29 LAB — POC INFLUENZA A AND B ANTIGEN (URGENT CARE ONLY)
INFLUENZA A ANTIGEN, POC: POSITIVE — AB
INFLUENZA B ANTIGEN, POC: NEGATIVE

## 2021-01-29 MED ORDER — PROMETHAZINE-DM 6.25-15 MG/5ML PO SYRP: 5.0000 mL | ORAL_SOLUTION | Freq: Two times a day (BID) | ORAL | 0 refills | Status: DC | PRN

## 2021-01-29 MED ORDER — ALBUTEROL SULFATE HFA 108 (90 BASE) MCG/ACT IN AERS
2.0000 | INHALATION_SPRAY | Freq: Once | RESPIRATORY_TRACT | Status: AC
  Administered 2021-01-29: 2 via RESPIRATORY_TRACT

## 2021-01-29 MED ORDER — OSELTAMIVIR PHOSPHATE 75 MG PO CAPS: 75.0000 mg | ORAL_CAPSULE | Freq: Two times a day (BID) | ORAL | 0 refills | Status: DC

## 2021-01-29 MED ORDER — ALBUTEROL SULFATE HFA 108 (90 BASE) MCG/ACT IN AERS
INHALATION_SPRAY | RESPIRATORY_TRACT | Status: AC
  Filled 2021-01-29: qty 6.7

## 2021-01-29 NOTE — ED Triage Notes (Signed)
Pt reports wheezing, cough and diarrhea x 2 days.

## 2021-01-29 NOTE — Discharge Instructions (Signed)
You have the flu.  Start Tamiflu twice daily for 5 days.  Use Promethazine DM for cough twice a day.  This make you sleepy do not drive or drink alcohol while taking it.  Alternate Tylenol ibuprofen for fever.  Make sure you are resting and drinking plenty of fluid.  If you have any worsening symptoms including shortness of breath, high fever not responding to medication, nausea, vomiting you need to be reevaluated immediately if symptoms are severe go to the emergency room.

## 2021-01-29 NOTE — ED Provider Notes (Signed)
[place]    CSN: [id number] Arrival date & time: 01/29/21  1028      History   Chief Complaint Chief Complaint  Patient presents with   Cough   Diarrhea    HPI [name] is a 70 y.o. female.   Patient presents today with a 2-day history of URI symptoms.  Reports wheezing, cough, nasal congestion, sore throat, voice changes, diarrhea.  Denies any chest pain, shortness of breath, headache, body aches.  She denies any known sick contacts but does report household contacts that are here to be seen due to similar symptoms.  She has not had her influenza vaccination.  She is up-to-date on COVID-19.  She has not had COVID-19 in the past.  Denies history of asthma or COPD.  She is a former smoker but quit many years ago.  Denies any recent antibiotic use.  She has not tried any over-the-counter medication for symptom management.  Reports she is eating and drinking normally despite symptoms.  She denies history of diabetes.  She does have a history of cardiovascular disease.   Past Medical History:  Diagnosis Date   Abnormal EKG 2012   hospitalized for T wave inversion in lateral leads with MSK chest pains, normal ECHO, negative trops, no cardiology consult. recent EKG 7/15 stable T wave inversions.    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   blood clot in the kidney    Patient Active Problem List   Diagnosis Date Noted   Daytime somnolence 01/12/2021   Atypical chest pain 12/13/2020   Abnormal cardiac CT angiography 12/13/2020   Left elbow pain 03/31/2020   Contact dermatitis 01/14/2019   Vaginal itching 12/05/2018   Pelvic pain 10/07/2018   Leg edema 08/28/2018   Thickened endometrium    Umbilical hernia without obstruction and without gangrene 01/01/2018    Osteopenia 09/05/2017   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   HLD (hyperlipidemia)    Aortic atherosclerosis ([place])    Back pain 07/08/2015   Scotoma of blind spot area in visual field 06/14/2015   Bilateral leg pain 06/14/2015   Seasonal allergies 03/09/2015   Hypopigmentation 03/09/2015   Osteoarthritis of left knee 07/08/2014   DJD (degenerative joint disease), lumbar 07/08/2014   Osteoarthritis of right knee 07/08/2014   Urinary frequency 06/11/2014   Healthcare maintenance 06/11/2014   Allergic rhinoconjunctivitis of both eyes 06/11/2014   Nearsightedness 04/27/2014   Abnormal EKG    Hypertension 12/11/2013    Past Surgical History:  Procedure Laterality Date   ABDOMINAL SURGERY  2010   for bowel blockage   COLON SURGERY  2010   bowel blockage   COLONOSCOPY  09/2014   HYSTEROSCOPY WITH D & C N/A 05/08/2018   Procedure: DILATATION AND CURETTAGE /HYSTEROSCOPY;  Surgeon: Mora Bellman, MD;  Location: [facility];  Service: Gynecology;  Laterality: N/A;   KNEE ARTHROSCOPY Left 09/25/2013   Procedure: LEFT KNEE ARTHROSCOPY WITH PARTIAL MEDIAL AND LATERAL  MENISCECTOMY/DEBRIDEMENT/MICRO FRACTURE MEDIAL FEMORAL CONDYLE, REMOVAL OF OSTEOCONDRAL FRAGMENT;  Surgeon: Johnn Hai, MD;  Location: [facility];  Service: Orthopedics;  Laterality: Left;   KNEE SURGERY Right 1999   LEFT HEART CATH AND CORONARY ANGIOGRAPHY N/A  12/13/2020   Procedure: LEFT HEART CATH AND CORONARY ANGIOGRAPHY;  Surgeon: Nelva Bush, MD;  Location: [facility];  Service: Cardiovascular;  Laterality: N/A;   POLYPECTOMY     TEE WITHOUT CARDIOVERSION N/A 10/04/2015   Procedure: TRANSESOPHAGEAL ECHOCARDIOGRAM (TEE);  Surgeon: Sueanne Margarita, MD;  Location: [facility];  Service: Cardiovascular;  Laterality: N/A;   TUBAL LIGATION  1978    OB History     Gravida  2   Para  2   Term      Preterm      AB      Living  2      SAB      IAB       Ectopic      Multiple      Live Births               Home Medications    Prior to Admission medications   Medication Sig Start Date End Date Taking? Authorizing Provider  oseltamivir (TAMIFLU) 75 MG capsule Take 1 capsule (75 mg total) by mouth every 12 (twelve) hours. 01/29/21  Yes Jesse Hirst, Derry Skill, PA-C  aspirin EC 81 MG tablet Take 1 tablet (81 mg total) daily by mouth. 01/31/17   Clent Demark, PA-C  atorvastatin (LIPITOR) 80 MG tablet Take 1 tablet (80 mg total) by mouth daily at 6 PM. Patient taking differently: Take 40 mg by mouth daily at 6 PM. 12/02/20   Donato Heinz, MD  carvedilol (COREG) 25 MG tablet Take 1 tablet (25 mg total) by mouth 2 (two) times daily with a meal. 12/07/20 12/02/21  Donato Heinz, MD  estradiol (ESTRACE VAGINAL) 0.1 MG/GM vaginal cream Place 1 Applicatorful vaginally at bedtime. 01/17/21   Elba Barman, DO  fluticasone (FLONASE) 50 MCG/ACT nasal spray SHAKE LIQUID AND USE 1 SPRAY IN EACH NOSTRIL EVERY DAY Patient taking differently: Place 2 sprays into both nostrils 2 (two) times daily. 11/22/20   Gifford Shave, MD  furosemide (LASIX) 40 MG tablet Take 1 tablet (40 mg total) by mouth daily. 12/13/20 12/13/21  End, Harrell Gave, MD  gabapentin (NEURONTIN) 300 MG capsule Take 300 mg by mouth 2 (two) times daily. 11/14/19   [provider]  isosorbide mononitrate (IMDUR) 30 MG 24 hr tablet Take 0.5 tablets (15 mg total) by mouth daily. 12/13/20 12/13/21  End, Harrell Gave, MD  losartan (COZAAR) 100 MG tablet Take 1 tablet (100 mg total) by mouth daily. 12/29/20   Donato Heinz, MD  Multiple Vitamin (MULITIVITAMIN WITH MINERALS) TABS Take 1 tablet by mouth daily.    [provider]  nitroGLYCERIN (NITROSTAT) 0.4 MG SL tablet Place 1 tablet (0.4 mg total) under the tongue every 5 (five) minutes as needed. 12/07/20 03/07/21  Donato Heinz, MD  potassium chloride SA (KLOR-CON) 20 MEQ tablet TAKE 1 TABLET(20 MEQ)  BY MOUTH DAILY 07/16/20   Gifford Shave, MD  promethazine-dextromethorphan (PROMETHAZINE-DM) 6.25-15 MG/5ML syrup Take 5 mLs by mouth 2 (two) times daily as needed for cough. 01/29/21   Drina Jobst, Derry Skill, PA-C  triamcinolone cream (KENALOG) 0.1 % Apply 1 application topically daily as needed (Itching). 07/29/20   [provider]    Family History Family History  Problem Relation Age of Onset   Hypertension Mother    Heart disease Mother        has a pacemaker    Alzheimer's disease Mother    Hyperlipidemia Mother    Osteoporosis Mother    Heart failure  Father    Hypertension Father    Alzheimer's disease Father    Hypertension Sister    Breast cancer Sister    Hypertension Sister    Gout Sister    Alcohol abuse Son    Pancreatitis Son    Cancer Neg Hx    Colon cancer Neg Hx    Esophageal cancer Neg Hx    Stomach cancer Neg Hx    Rectal cancer Neg Hx     Social History Social History   Tobacco Use   Smoking status: Former    Types: Cigarettes    Quit date: 07/29/2010    Years since quitting: 10.5   Smokeless tobacco: Never  Vaping Use   Vaping Use: Never used  Substance Use Topics   Alcohol use: Never    Alcohol/week: 0.0 standard drinks   Drug use: Never     Allergies   Shrimp [shellfish allergy], Latex, and Penicillins   Review of Systems Review of Systems  Constitutional:  Positive for activity change and fatigue. Negative for appetite change and fever.  HENT:  Positive for congestion, sinus pressure and sore throat. Negative for sneezing.   Respiratory:  Positive for cough. Negative for shortness of breath.   Cardiovascular:  Negative for chest pain.  Gastrointestinal:  Positive for diarrhea. Negative for abdominal pain, nausea and vomiting.  Musculoskeletal:  Negative for arthralgias and myalgias.  Neurological:  Positive for headaches. Negative for dizziness and light-headedness.    Physical Exam Triage Vital Signs ED Triage Vitals  Enc Vitals  Group     BP 01/29/21 1154 (!) 152/71     Pulse Rate 01/29/21 1154 60     Resp 01/29/21 1154 20     Temp 01/29/21 1154 98.2 F (36.8 C)     Temp Source 01/29/21 1154 Oral     SpO2 01/29/21 1154 98 %     Weight --      Height --      Head Circumference --      Peak Flow --      Pain Score 01/29/21 1152 0     Pain Loc --      Pain Edu? --      Excl. in [place]? --    No data found.  Updated Vital Signs BP (!) 152/71 (BP Location: Right Arm)   Pulse 60   Temp 98.2 F (36.8 C) (Oral)   Resp 20   SpO2 98%   Visual Acuity Right Eye Distance:   Left Eye Distance:   Bilateral Distance:    Right Eye Near:   Left Eye Near:    Bilateral Near:     Physical Exam Vitals reviewed.  Constitutional:      General: She is awake. She is not in acute distress.    Appearance: Normal appearance. She is well-developed. She is not ill-appearing.     Comments: Very pleasant female appears stated age no acute distress sitting comfortably in exam room  HENT:     Head: Normocephalic and atraumatic.     Right Ear: Tympanic membrane, ear canal and external ear normal. Tympanic membrane is not erythematous or bulging.     Left Ear: Tympanic membrane, ear canal and external ear normal. Tympanic membrane is not erythematous or bulging.     Nose:     Right Sinus: No maxillary sinus tenderness or frontal sinus tenderness.     Left Sinus: No maxillary sinus tenderness or frontal sinus tenderness.     Mouth/Throat:  Pharynx: Uvula midline. No oropharyngeal exudate or posterior oropharyngeal erythema.  Cardiovascular:     Rate and Rhythm: Normal rate and regular rhythm.     Heart sounds: Normal heart sounds, S1 normal and S2 normal. No murmur heard. Pulmonary:     Effort: Pulmonary effort is normal.     Breath sounds: Normal breath sounds. No wheezing, rhonchi or rales.     Comments: Clear to auscultation bilaterally Psychiatric:        Behavior: Behavior is cooperative.     UC Treatments /  Results  Labs (all labs ordered are listed, but only abnormal results are displayed) Labs Reviewed  POC INFLUENZA A AND B ANTIGEN (URGENT CARE ONLY) - Abnormal; Notable for the following components:      Result Value   INFLUENZA A ANTIGEN, POC POSITIVE (*)    All other components within normal limits  SARS CORONAVIRUS 2 (TAT 6-24 HRS)    EKG   Radiology No results found.  Procedures Procedures (including critical care time)  Medications Ordered in UC Medications  albuterol (VENTOLIN HFA) 108 (90 Base) MCG/ACT inhaler 2 puff (2 puffs Inhalation Given 01/29/21 1232)    Initial Impression / Assessment and Plan / UC Course  I have reviewed the triage vital signs and the nursing notes.  Pertinent labs & imaging results that were available during my care of the patient were reviewed by me and considered in my medical decision making (see chart for details).     Influenza testing was positive in clinic.  Patient has been symptomatic for 48 hours so we will start Tamiflu.  She was prescribed Promethazine DM for cough with instruction not to drive or drink alcohol with this medication as drowsiness is a common side effect.  She was given albuterol in clinic with improvement of symptoms and sent home with albuterol inhaler with instruction take this every 4-6 hours as needed.  Recommended using over-the-counter medications including Tylenol and ibuprofen.  Can use Mucinex and Flonase for symptom relief.  Recommended resting and drinking plenty of fluid.  Discussed alarm symptoms that warrant emergent evaluation.  Strict return precautions given to which she expressed understanding.  Final Clinical Impressions(s) / UC Diagnoses   Final diagnoses:  Influenza A  Acute cough     Discharge Instructions      You have the flu.  Start Tamiflu twice daily for 5 days.  Use Promethazine DM for cough twice a day.  This make you sleepy do not drive or drink alcohol while taking it.  Alternate  Tylenol ibuprofen for fever.  Make sure you are resting and drinking plenty of fluid.  If you have any worsening symptoms including shortness of breath, high fever not responding to medication, nausea, vomiting you need to be reevaluated immediately if symptoms are severe go to the emergency room.     ED Prescriptions     Medication Sig Dispense Auth. Provider   promethazine-dextromethorphan (PROMETHAZINE-DM) 6.25-15 MG/5ML syrup Take 5 mLs by mouth 2 (two) times daily as needed for cough. 100 mL Dakwon Wenberg K, PA-C   oseltamivir (TAMIFLU) 75 MG capsule Take 1 capsule (75 mg total) by mouth every 12 (twelve) hours. 10 capsule Antwuan Eckley, Derry Skill, PA-C      PDMP not reviewed this encounter.   Terrilee Croak, PA-C 01/29/21 1256

## 2021-01-30 LAB — SARS CORONAVIRUS 2 (TAT 6-24 HRS): SARS Coronavirus 2: NEGATIVE

## 2021-01-30 NOTE — Progress Notes (Unsigned)
Cardiology Office Note:    Date:  01/30/2021   ID:  [name], DOB [birth year], MRN [id number]  PCP:  Gifford Shave, MD  Cardiologist:  None  Electrophysiologist:  None   Referring MD: Gifford Shave, MD   No chief complaint on file.   History of Present Illness:    [name] is a 70 y.o. female with a hx of hypertension, hyperlipidemia, renal infarct, ?aortic valve vegetation who is referred by Dr. Andria Frames for evaluation of chest pain.  She was admitted to [facility] from 7/6 through 10/04/2015 with lower abdominal pain.  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  She was started on heparin drip.  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  Blood cultures were negative.  She was started on anticoagulation with warfarin.  Echo 10/01/2015 showed EF 65 to 25%, grade 1 diastolic dysfunction.  She completed 6 months of anticoagulation and was then transitioned to aspirin 81 mg daily.  No further episodes.  She reports she started having chest pain in April.  Describes lower left-sided chest pain that feels sharp.  Typically last for about 5 minutes.  Has not noticed with exertion but does notice it can occur with stress.  She walks 2 days/week for 30 minutes.  She denies any dyspnea, headedness, syncope, or palpitations.  Does report intermittent ankle swelling.  Reports does not check BP regularly at home, was 149/80 when last checked.  She quit smoking 10 years ago (smoked 0.5 packs/day for 10 years).  Family history includes mother had pacemaker, she is unsure of details.  Coronary CTA on 11/26/2020 showed moderate CAD in mid and distal RCA, calcium score 936 (97th percentile).  CT FFR suggested total occlusion in mid to distal RCA and indeterminate FFR value in distal LAD that may be due to vessel tapering of small caliber distal vessel stenosis.  Cath 12/13/2020 showed chronic subtotal (99%)  occlusion of distal RCA with left-to-right collaterals, mild proximal LAD disease.  Since last clinic visit,  Zio patch  reports he has been doing okay.  Had 1 episode of chest pain since last appointment, but lasted short duration.  Denies any dyspnea, lightheadedness, syncope, lower extremity edema, or palpitations.    BP Readings from Last 3 Encounters:  01/29/21 (!) 152/71  01/17/21 (!) 179/75  12/29/20 (!) 146/78      Past Medical History:  Diagnosis Date   Abnormal EKG 2012   hospitalized for T wave inversion in lateral leads with MSK chest pains, normal ECHO, negative trops, no cardiology consult. recent EKG 7/15 stable T wave inversions.    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   blood clot in the kidney    Past Surgical History:  Procedure Laterality Date   ABDOMINAL SURGERY  2010   for bowel blockage   COLON SURGERY  2010   bowel blockage   COLONOSCOPY  09/2014   HYSTEROSCOPY WITH D & C N/A 05/08/2018   Procedure: DILATATION AND CURETTAGE /HYSTEROSCOPY;  Surgeon: Mora Bellman, MD;  Location: [facility];  Service: Gynecology;  Laterality: N/A;   KNEE ARTHROSCOPY Left 09/25/2013   Procedure: LEFT KNEE ARTHROSCOPY WITH PARTIAL  MEDIAL AND LATERAL  MENISCECTOMY/DEBRIDEMENT/MICRO FRACTURE MEDIAL FEMORAL CONDYLE, REMOVAL OF OSTEOCONDRAL FRAGMENT;  Surgeon: Johnn Hai, MD;  Location: [facility];  Service: Orthopedics;  Laterality: Left;   KNEE SURGERY Right 1999   LEFT HEART CATH AND CORONARY ANGIOGRAPHY N/A 12/13/2020   Procedure: LEFT HEART CATH AND CORONARY ANGIOGRAPHY;  Surgeon: Nelva Bush, MD;  Location: [facility];  Service: Cardiovascular;  Laterality: N/A;   POLYPECTOMY     TEE WITHOUT CARDIOVERSION N/A 10/04/2015   Procedure:  TRANSESOPHAGEAL ECHOCARDIOGRAM (TEE);  Surgeon: Sueanne Margarita, MD;  Location: [facility];  Service: Cardiovascular;  Laterality: N/A;   TUBAL LIGATION  1978    Current Medications: No outpatient medications have been marked as taking for the 02/01/21 encounter (Appointment) with Donato Heinz, MD.     Allergies:   Shrimp [shellfish allergy], Latex, and Penicillins   Social History   Socioeconomic History   Marital status: Single    Spouse name: Not on file   Number of children: 2   Years of education: some colle   Highest education level: Not on file  Occupational History   Occupation: Custodian     Employer: [employer]  Tobacco Use   Smoking status: Former    Types: Cigarettes    Quit date: 07/29/2010    Years since quitting: 10.5   Smokeless tobacco: Never  Vaping Use   Vaping Use: Never used  Substance and Sexual Activity   Alcohol use: Never    Alcohol/week: 0.0 standard drinks   Drug use: Never   Sexual activity: Not Currently    Birth control/protection: Surgical    Comment: 1st intercourse 70 yo-Fewer than 5 partners  Other Topics Concern   Not on file  Social History Narrative   Live with mom and son (39).   Social Determinants of Health   Financial Resource Strain: Not on file  Food Insecurity: Not on file  Transportation Needs: Not on file  Physical Activity: Not on file  Stress: Not on file  Social Connections: Not on file     Family History: The patient's family history includes Alcohol abuse in her son; Alzheimer's disease in her father and mother; Breast cancer in her sister; Gout in her sister; Heart disease in her mother; Heart failure in her father; Hyperlipidemia in her mother; Hypertension in her father, mother, sister, and sister; Osteoporosis in her mother; Pancreatitis in her son. There is no history of Cancer, Colon cancer, Esophageal cancer, Stomach cancer, or Rectal cancer.  ROS:   Please see the history of present  illness.     All other systems reviewed and are negative.  EKGs/Labs/Other Studies Reviewed:    The following studies were reviewed today:   EKG:  EKG is  ordered today.  The ekg ordered today demonstrates normal sinus rhythm, rate 68, T wave inversions in leads I, aVL, V3-6   Recent Labs: 06/19/2020: ALT 20 12/07/2020: BUN 18; Creatinine, Ser 1.07; Hemoglobin 12.5; Platelets 303; Potassium 4.2; Sodium 141  Recent Lipid Panel    Component Value Date/Time   CHOL 208 (H) 10/14/2020 1513   TRIG 275 (H) 10/14/2020 1513   HDL 52 10/14/2020 1513   CHOLHDL 4.0 10/14/2020 1513   CHOLHDL 4.7 10/01/2015 0732   VLDL 27 10/01/2015 0732   LDLCALC 109 (H) 10/14/2020 1513    Physical Exam:    VS:  There were no vitals taken for this visit.    Wt Readings from Last 3 Encounters:  01/12/21  190 lb (86.2 kg)  12/29/20 200 lb (90.7 kg)  12/13/20 190 lb (86.2 kg)     GEN:  Well nourished, well developed in no acute distress HEENT: Normal NECK: No JVD; No carotid bruits LYMPHATICS: No lymphadenopathy CARDIAC: LXB,2/6 systolic murmur RESPIRATORY:  Clear to auscultation without rales, wheezing or rhonchi  ABDOMEN: Soft, non-tender, non-distended MUSCULOSKELETAL:  Trace edema; No deformity  SKIN: Warm and dry NEUROLOGIC:  Alert and oriented x 3 PSYCHIATRIC:  Normal affect   ASSESSMENT:    No diagnosis found.   PLAN:    CAD: Reports atypical chest pain, no clear relationship with exertion but does appear to be correlated to stress.  Coronary CTA on 11/26/2020 showed moderate CAD in mid and distal RCA, calcium score 936 (97th percentile).  CT FFR suggested total occlusion in mid to distal RCA and indeterminate FFR value in distal LAD that may be due to vessel tapering of small caliber distal vessel stenosis.  Cath 12/13/2020 showed chronic subtotal (99%) occlusion of distal RCA with left-to-right collaterals, mild proximal LAD disease.  Echocardiogram 12/10/2020 showed normal biventricular  function, no significant valvular disease. -Continue aspirin 81 mg daily -Continue atorvastatin 80 mg daily -Continue carvedilol to 25 mg twice daily -Continue Imdur 50 mg daily -As needed SL NTG -If refractory angina despite optimized antianginal regimen, could consider PCI to distal RCA  Chronic diastolic heart failure: LVEDP 25-30 on cath 12/13/2020.  Started on Lasix 40 mg daily. -Check BMP, mag  Heart murmur: Echocardiogram 12/10/2020 showed normal biventricular function, no significant valvular disease.  Renal infarct: Presented with renal infarct in 2017.  TEE showed possible aortic valve vegetation versus Lambls excrescence.  Blood cultures negative.  Completed 6 months of warfarin.  Has not had recurrent evidence of embolic events.  Echocardiogram 12/10/2020 unremarkable. -Zio patch x2 weeks to evaluate for A. Fib  Hypertension: On carvedilol 25 mg twice daily, Lasix 40 mg daily, losartan 100 mg daily.    Hyperlipidemia: On atorvastatin 40 mg daily,  LDL 109 on 10/14/2020.  Atorvastatin increased to 80 mg daily  Daytime somnolence: Check sleep study.  Epworth score 16   RTC in ***   Medication Adjustments/Labs and Tests Ordered: Current medicines are reviewed at length with the patient today.  Concerns regarding medicines are outlined above.  No orders of the defined types were placed in this encounter.    No orders of the defined types were placed in this encounter.     There are no Patient Instructions on file for this visit.   Signed, Donato Heinz, MD  01/30/2021 9:49 PM    [place]

## 2021-02-01 ENCOUNTER — Other Ambulatory Visit: Payer: Self-pay

## 2021-02-01 ENCOUNTER — Ambulatory Visit: Payer: Medicare Other | Admitting: Cardiology

## 2021-02-02 ENCOUNTER — Other Ambulatory Visit: Payer: Self-pay | Admitting: Family Medicine

## 2021-02-06 ENCOUNTER — Encounter (HOSPITAL_BASED_OUTPATIENT_CLINIC_OR_DEPARTMENT_OTHER): Payer: Self-pay | Admitting: Cardiovascular Disease

## 2021-02-06 NOTE — Procedures (Signed)
Patient Name: [name], [name] Date: 01/12/2021 Gender: Female D.O.B: 05-01-1950 Age (years): 43 Referring Provider: Oswaldo Milian Height (inches): 72 Interpreting Physician: Shelva Majestic MD, ABSM Weight (lbs): 190 RPSGT: Laren Everts BMI: 32 MRN: [id number] Neck Size: 16.00  CLINICAL INFORMATION Sleep Study Type: Split Night CPAP  Indication for sleep study: Excessive Daytime Sleepiness, Hypertension, Obesity  Epworth Sleepiness Score: 11  SLEEP STUDY TECHNIQUE As per the AASM Manual for the Scoring of Sleep and Associated Events v2.3 (April 2016) with a hypopnea requiring 4% desaturations.  The channels recorded and monitored were frontal, central and occipital EEG, electrooculogram (EOG), submentalis EMG (chin), nasal and oral airflow, thoracic and abdominal wall motion, anterior tibialis EMG, snore microphone, electrocardiogram, and pulse oximetry. Continuous positive airway pressure (CPAP) was initiated when the patient met split night criteria and was titrated according to treat sleep-disordered breathing.  MEDICATIONS aspirin EC 81 MG tablet atorvastatin (LIPITOR) 80 MG tablet carvedilol (COREG) 25 MG tablet estradiol (ESTRACE VAGINAL) 0.1 MG/GM vaginal cream fluticasone (FLONASE) 50 MCG/ACT nasal spray furosemide (LASIX) 40 MG tablet gabapentin (NEURONTIN) 300 MG capsule isosorbide mononitrate (IMDUR) 30 MG 24 hr tablet losartan (COZAAR) 100 MG tablet Multiple Vitamin (MULITIVITAMIN WITH MINERALS) TABS nitroGLYCERIN (NITROSTAT) 0.4 MG SL tablet oseltamivir (TAMIFLU) 75 MG capsule potassium chloride SA (KLOR-CON) 20 MEQ tablet promethazine-dextromethorphan (PROMETHAZINE-DM) 6.25-15 MG/5ML syrup triamcinolone cream (KENALOG) 0.1 %  Medications self-administered by patient taken the night of the study : N/A  RESPIRATORY PARAMETERS Diagnostic Total AHI (/hr): 22.0 RDI (/hr): 38.9 OA Index (/hr): 0.5 CA Index (/hr): 0.0 REM AHI  (/hr): 70.6 NREM AHI (/hr): 18.6 Supine AHI (/hr): 0.0 Non-supine AHI (/hr): 23.1 Min O2 Sat (%): 84.0 Mean O2 (%): 94.2 Time below 88% (min): 1.4   Titration Optimal Pressure (cm): 15 AHI at Optimal Pressure (/hr): 0 Min O2 at Optimal Pressure (%): 94.0 Supine % at Optimal (%): 0 Sleep % at Optimal (%): 97   SLEEP ARCHITECTURE The recording time for the entire night was 398.5 minutes.  During a baseline period of 197.2 minutes, the patient slept for 131.0 minutes in REM and nonREM, yielding a sleep efficiency of 66.4%%. Sleep onset after lights out was 27.3 minutes with a REM latency of 156.5 minutes. The patient spent 12.6%% of the night in stage N1 sleep, 80.9%% in stage N2 sleep, 0.0%% in stage N3 and 6.5% in REM.  During the titration period of 191.1 minutes, the patient slept for 130.0 minutes in REM and nonREM, yielding a sleep efficiency of 68.0%%. Sleep onset after CPAP initiation was 53.9 minutes with a REM latency of 54.0 minutes. The patient spent 6.9%% of the night in stage N1 sleep, 52.7%% in stage N2 sleep, 0.0%% in stage N3 and 40.4% in REM.  CARDIAC DATA The 2 lead EKG demonstrated sinus rhythm. The mean heart rate was 100.0 beats per minute. Other EKG findings include: None.  LEG MOVEMENT DATA The total Periodic Limb Movements of Sleep (PLMS) were 0. The PLMS index was 0.0 .  IMPRESSIONS - Moderate obstructive sleep apnea occurred during the diagnostic portion of the study (AHI 22.0/h, RDI 38.9/h); events were very severe during REM sleep (AHI 70.6/h).  CPAP was initiated aty 5 cm and was titrated to 15 cm of water; AHI 0, but REM sleep did not occur at 15 cm of water.  - The patient had oxygen desaturation during the diagnostic portion of the study to a nadir of 84.0%. - The patient snored with moderate snoring volume  during the diagnostic portion of the study. - No cardiac abnormalities were noted during this study. - Clinically significant periodic limb movements did not  occur during sleep.  DIAGNOSIS - Obstructive Sleep Apnea (G47.33)  RECOMMENDATIONS - Recommend a initial trial of CPAP Auto therapy with EPR of 3 on 15 - 20 cm H2O with heated humidification. A Small size Resmed Full Face Mask AirFit F20 mask was used for the titration study. - Effort should be made to optimize nasal and oropharyngeal patency. - Avoid alcohol, sedatives and other CNS depressants that may worsen sleep apnea and disrupt normal sleep architecture. - Sleep hygiene should be reviewed to assess factors that may improve sleep quality. - Weight management (BMI 32) and regular exercise should be initiated or continued. - Recommend a download and sleep clinic evaluation after 4 weeks of therapy.  [Electronically signed] 02/06/2021 12:47 PM  Shelva Majestic MD, [facility], ABSM Diplomate, American Board of Sleep Medicine   NPI: [id number] [place] PH: [phone]   FX: [phone] [place]

## 2021-02-07 ENCOUNTER — Other Ambulatory Visit: Payer: Self-pay | Admitting: Family Medicine

## 2021-02-07 DIAGNOSIS — Z1231 Encounter for screening mammogram for malignant neoplasm of breast: Secondary | ICD-10-CM

## 2021-02-09 ENCOUNTER — Telehealth: Payer: Self-pay | Admitting: *Deleted

## 2021-02-09 NOTE — Telephone Encounter (Signed)
Patient notified of sleep study results and recommendations. She seemed to not comprehend what I was saying to her. She had multiple questions that were answered, however she asked for me to call her son, [name] and inform him of the results.

## 2021-02-09 NOTE — Telephone Encounter (Signed)
Spoke with patient's son per her request to give him her sleep study results and recommendations. He had basic questions concerning the inspire device, which all of his questions were answered to his satisfaction. He was not aware that the inspire involved a surgical procedure. He agrees to proceed with the CPAP order and speak with his mother to try to explain to her what I discussed to help her to understand it better. Order will be sent to [facility].

## 2021-02-09 NOTE — Telephone Encounter (Signed)
-----   Message from Troy Sine, MD sent at 02/06/2021 12:53 PM EST ----- Mariann Laster, please notify pt and set up with DME for CPAP

## 2021-02-22 ENCOUNTER — Ambulatory Visit: Payer: Medicare Other | Admitting: Cardiology

## 2021-03-11 ENCOUNTER — Ambulatory Visit
Admission: RE | Admit: 2021-03-11 | Discharge: 2021-03-11 | Disposition: A | Discharge status: Home / Self Care | Payer: Medicare Other | Source: Ambulatory Visit | Attending: Family Medicine | Admitting: Family Medicine

## 2021-03-11 DIAGNOSIS — Z1231 Encounter for screening mammogram for malignant neoplasm of breast: Secondary | ICD-10-CM

## 2021-03-22 ENCOUNTER — Encounter: Payer: Self-pay | Admitting: Pharmacist Clinician (PhC)/ Clinical Pharmacy Specialist

## 2021-03-28 NOTE — Progress Notes (Signed)
Cardiology Office Note:    Date:  03/29/2021   ID:  [name], DOB 09-06-1950, MRN [id number]  PCP:  Denise Shave, MD  Cardiologist:  None  Electrophysiologist:  None   Referring MD: Denise Shave, MD   Chief Complaint  Patient presents with   Coronary Artery Disease     History of Present Illness:    [name] is a 71 y.o. female with a hx of hypertension, hyperlipidemia, renal infarct, ?aortic valve vegetation who presents for follow-up.  She was referred by Dr. Andria Huang for evaluation of chest pain, initially seen on 11/18/2020.  She was admitted to [facility] from 7/6 through 10/04/2015 with lower abdominal pain.  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  She was started on heparin drip.  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  Blood cultures were negative.  She was started on anticoagulation with warfarin.  Echo 10/01/2015 showed EF 65 to 30%, grade 1 diastolic dysfunction.  She completed 6 months of anticoagulation and was then transitioned to aspirin 81 mg daily.  No further episodes.  She reports she started having chest pain in April 2022.  Describes lower left-sided chest pain that feels sharp.  Typically last for about 5 minutes.  Has not noticed with exertion but does notice it can occur with stress.  She walks 2 days/week for 30 minutes.  She denies any dyspnea, headedness, syncope, or palpitations.  Does report intermittent ankle swelling.  Reports does not check BP regularly at home, was 149/80 when last checked.  She quit smoking 10 years ago (smoked 0.5 packs/day for 10 years).  Family history includes mother had pacemaker, she is unsure of details.  Coronary CTA on 11/26/2020 showed moderate CAD in mid and distal RCA, calcium score 936 (97th percentile).  CT FFR suggested total occlusion in mid to distal RCA and indeterminate FFR value in distal LAD that may be due to  vessel tapering of small caliber distal vessel stenosis.  Cath 12/13/2020 showed chronic subtotal (99%) occlusion of distal RCA with left-to-right collaterals, mild proximal LAD disease.  Since last clinic visit, she reports that she has been doing okay.  States that chest pain has improved.  Denies any dyspnea, lightheadedness, syncope, or palpitations.  She walks twice per week for 20 minutes.  Reports has been having pain in her legs with walking.  BP Readings from Last 3 Encounters:  03/29/21 (!) 164/92  01/29/21 (!) 152/71  01/17/21 (!) 179/75      Past Medical History:  Diagnosis Date   Abnormal EKG 2012   hospitalized for T wave inversion in lateral leads with MSK chest pains, normal ECHO, negative trops, no cardiology consult. recent EKG 7/15 stable T wave inversions.    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   blood clot in the kidney    Past Surgical History:  Procedure Laterality Date   ABDOMINAL SURGERY  2010   for bowel blockage   COLON SURGERY  2010   bowel blockage   COLONOSCOPY  09/2014   HYSTEROSCOPY WITH D & C N/A 05/08/2018   Procedure: DILATATION AND CURETTAGE /HYSTEROSCOPY;  Surgeon: Denise Bellman, MD;  Location:  [facility];  Service: Gynecology;  Laterality: N/A;   KNEE ARTHROSCOPY Left 09/25/2013   Procedure: LEFT KNEE ARTHROSCOPY WITH PARTIAL MEDIAL AND LATERAL  MENISCECTOMY/DEBRIDEMENT/MICRO FRACTURE MEDIAL FEMORAL CONDYLE, REMOVAL OF OSTEOCONDRAL FRAGMENT;  Surgeon: Denise Hai, MD;  Location: [facility];  Service: Orthopedics;  Laterality: Left;   KNEE SURGERY Right 1999   LEFT HEART CATH AND CORONARY ANGIOGRAPHY N/A 12/13/2020   Procedure: LEFT HEART CATH AND CORONARY ANGIOGRAPHY;  Surgeon: Denise Bush, MD;  Location: [facility];   Service: Cardiovascular;  Laterality: N/A;   POLYPECTOMY     TEE WITHOUT CARDIOVERSION N/A 10/04/2015   Procedure: TRANSESOPHAGEAL ECHOCARDIOGRAM (TEE);  Surgeon: Denise Margarita, MD;  Location: [facility] ENDOSCOPY;  Service: Cardiovascular;  Laterality: N/A;   TUBAL LIGATION  1978    Current Medications: Current Meds  Medication Sig   amLODipine (NORVASC) 5 MG tablet Take 1 tablet (5 mg total) by mouth daily.   aspirin EC 81 MG tablet Take 1 tablet (81 mg total) daily by mouth.   atorvastatin (LIPITOR) 80 MG tablet Take 1 tablet (80 mg total) by mouth daily at 6 PM. (Patient taking differently: Take 40 mg by mouth daily at 6 PM.)   carvedilol (COREG) 25 MG tablet Take 1 tablet (25 mg total) by mouth 2 (two) times daily with a meal.   estradiol (ESTRACE VAGINAL) 0.1 MG/GM vaginal cream Place 1 Applicatorful vaginally at bedtime.   fluticasone (FLONASE) 50 MCG/ACT nasal spray Place 2 sprays into both nostrils 2 (two) times daily.   furosemide (LASIX) 40 MG tablet Take 1 tablet (40 mg total) by mouth daily.   gabapentin (NEURONTIN) 300 MG capsule Take 300 mg by mouth 2 (two) times daily.   isosorbide mononitrate (IMDUR) 30 MG 24 hr tablet Take 0.5 tablets (15 mg total) by mouth daily.   losartan (COZAAR) 100 MG tablet Take 1 tablet (100 mg total) by mouth daily.   Multiple Vitamin (MULITIVITAMIN WITH MINERALS) TABS Take 1 tablet by mouth daily.   oseltamivir (TAMIFLU) 75 MG capsule Take 1 capsule (75 mg total) by mouth every 12 (twelve) hours.   potassium chloride SA (KLOR-CON) 20 MEQ tablet TAKE 1 TABLET(20 MEQ) BY MOUTH DAILY   promethazine-dextromethorphan (PROMETHAZINE-DM) 6.25-15 MG/5ML syrup Take 5 mLs by mouth 2 (two) times daily as needed for cough.   triamcinolone cream (KENALOG) 0.1 % Apply 1 application topically daily as needed (Itching).     Allergies:   Shrimp [shellfish allergy], Latex, and Penicillins   Social History   Socioeconomic History   Marital status: Single    Spouse  name: Not on file   Number of children: 2   Years of education: some colle   Highest education level: Not on file  Occupational History   Occupation: Custodian     Employer: [employer]  Tobacco Use   Smoking status: Former    Types: Cigarettes    Quit date: 07/29/2010    Years since quitting: 10.6   Smokeless tobacco: Never  Vaping Use   Vaping Use: Never used  Substance and Sexual Activity   Alcohol use: Never    Alcohol/week: 0.0 standard drinks   Drug use: Never   Sexual activity: Not Currently    Birth control/protection: Surgical    Comment: 1st intercourse 71 yo-Fewer than 5 partners  Other Topics Concern   Not on file  Social History Narrative   Live with mom and son (63).   Social Determinants of Health  Financial Resource Strain: Not on file  Food Insecurity: Not on file  Transportation Needs: Not on file  Physical Activity: Not on file  Stress: Not on file  Social Connections: Not on file     Family History: The patient's family history includes Alcohol abuse in her son; Alzheimer's disease in her father and mother; Breast cancer in her sister; Gout in her sister; Heart disease in her mother; Heart failure in her father; Hyperlipidemia in her mother; Hypertension in her father, mother, sister, and sister; Osteoporosis in her mother; Pancreatitis in her son. There is no history of Cancer, Colon cancer, Esophageal cancer, Stomach cancer, or Rectal cancer.  ROS:   Please see the history of present illness.     All other systems reviewed and are negative.  EKGs/Labs/Other Studies Reviewed:    The following studies were reviewed today:   EKG:  EKG is  ordered today.  The ekg ordered today demonstrates normal sinus rhythm, rate 68, T wave inversions in leads I, aVL, V3-6   Recent Labs: 06/19/2020: ALT 20 12/07/2020: BUN 18; Creatinine, Ser 1.07; Hemoglobin 12.5; Platelets 303; Potassium 4.2; Sodium 141  Recent Lipid Panel    Component Value  Date/Time   CHOL 208 (H) 10/14/2020 1513   TRIG 275 (H) 10/14/2020 1513   HDL 52 10/14/2020 1513   CHOLHDL 4.0 10/14/2020 1513   CHOLHDL 4.7 10/01/2015 0732   VLDL 27 10/01/2015 0732   LDLCALC 109 (H) 10/14/2020 1513    Physical Exam:    VS:  BP (!) 164/92    Pulse 90    Ht 5\' 5"  (1.651 m)    Wt 196 lb 3.2 oz (89 kg)    SpO2 95%    BMI 32.65 kg/m     Wt Readings from Last 3 Encounters:  03/29/21 196 lb 3.2 oz (89 kg)  02/01/21 198 lb 12.8 oz (90.2 kg)  01/12/21 190 lb (86.2 kg)     GEN:  Well nourished, well developed in no acute distress HEENT: Normal NECK: No JVD; No carotid bruits LYMPHATICS: No lymphadenopathy CARDIAC: XFG,1/8 systolic murmur RESPIRATORY:  Clear to auscultation without rales, wheezing or rhonchi  ABDOMEN: Soft, non-tender, non-distended MUSCULOSKELETAL:  Trace edema; No deformity  SKIN: Warm and dry NEUROLOGIC:  Alert and oriented x 3 PSYCHIATRIC:  Normal affect   ASSESSMENT:    1. Coronary artery disease of native artery of native heart with stable angina pectoris ([place])   2. Chronic diastolic heart failure (HCC)   3. Pain in both lower extremities   4. Essential hypertension   5. Hyperlipidemia, unspecified hyperlipidemia type   6. Renal infarct [facility])      PLAN:    CAD: Reports atypical chest pain, no clear relationship with exertion but does appear to be correlated to stress.  Coronary CTA on 11/26/2020 showed moderate CAD in mid and distal RCA, calcium score 936 (97th percentile).  CT FFR suggested total occlusion in mid to distal RCA and indeterminate FFR value in distal LAD that may be due to vessel tapering of small caliber distal vessel stenosis.  Cath 12/13/2020 showed chronic subtotal (99%) occlusion of distal RCA with left-to-right collaterals, mild proximal LAD disease.  Echocardiogram 12/10/2020 showed normal biventricular function, no significant valvular disease. -Continue aspirin 81 mg daily -Continue atorvastatin 80 mg daily -Continue  carvedilol to 25 mg twice daily -Continue Imdur 15 mg daily -As needed SL NTG -If refractory angina despite optimized antianginal regimen, could consider PCI to distal RCA  Chronic diastolic  heart failure: LVEDP 25-30 on cath 12/13/2020.  Started on Lasix 40 mg daily. -Check BMP, mag  Heart murmur: Echocardiogram 12/10/2020 showed normal biventricular function, no significant valvular disease.  Renal infarct: Presented with renal infarct in 2017.  TEE showed possible aortic valve vegetation versus Lambls excrescence.  Blood cultures negative.  Completed 6 months of warfarin.  Has not had recurrent evidence of embolic events.  Echocardiogram 12/10/2020 unremarkable. -Zio patch x2 weeks to evaluate for A. Fib  Hypertension: On carvedilol 25 mg twice daily, Lasix 40 mg daily, losartan 100 mg daily.  BP remains elevated.  We will add amlodipine 5 mg daily.  Asked to check BP daily for next 2 weeks and call with results.  We will schedule follow-up with pharmacy hypertension clinic in 2 weeks.  Suspect untreated OSA contributing to hypertension, she has been started on CPAP.  Hyperlipidemia: On atorvastatin 40 mg daily,  LDL 109 on 10/14/2020.  Atorvastatin increased to 80 mg daily.  We will recheck lipid panel  OSA: Sleep study on 01/12/2021 positive for sleep apnea, starting CPAP  Leg pain: Check ABIs   RTC in 6 months   Medication Adjustments/Labs and Tests Ordered: Current medicines are reviewed at length with the patient today.  Concerns regarding medicines are outlined above.  Orders Placed This Encounter  Procedures   Basic Metabolic Panel (BMET)   Magnesium   Lipid panel   AMB Referral to [facility]   VAS [place] ABI WITH/WO TBI   VAS [place] LOWER EXTREMITY ARTERIAL DUPLEX      Meds ordered this encounter  Medications   amLODipine (NORVASC) 5 MG tablet    Sig: Take 1 tablet (5 mg total) by mouth daily.    Dispense:  180 tablet    Refill:  3       Patient Instructions   Medication Instructions:   START AMLODIPINE 5 MG ONCE DAILY  *If you need a refill on your cardiac medications before your next appointment, please call your pharmacy*   Lab Work:  Your physician recommends that you return for lab work FASTING  If you have labs (blood work) drawn today and your tests are completely normal, you will receive your results only by: [place] (if you have MyChart) OR A paper copy in the mail If you have any lab test that is abnormal or we need to change your treatment, we will call you to review the results.   Testing/Procedures:  Your physician has requested that you have an ankle brachial index (ABI). During this test an ultrasound and blood pressure cuff are used to evaluate the arteries that supply the arms and legs with blood. Allow thirty minutes for this exam. There are no restrictions or special instructions. [facility]   Follow-Up: At [facility], you and your health needs are our priority.  As part of our continuing mission to provide you with exceptional heart care, we have created designated Provider Care Teams.  These Care Teams include your primary Cardiologist (physician) and Advanced Practice Providers (APPs -  Physician Assistants and Nurse Practitioners) who all work together to provide you with the care you need, when you need it.  We recommend signing up for the patient portal called "MyChart".  Sign up information is provided on this After Visit Summary.  MyChart is used to connect with patients for Virtual Visits (Telemedicine).  Patients are able to view lab/test results, encounter notes, upcoming appointments, etc.  Non-urgent messages can be sent to your provider  as well.   To learn more about what you can do with MyChart, go to [email or web address].    2 WEEKS APPOINTMENT WITH THE PHARMACIST FOR BLOOD PRESSURE  Your next appointment:   6 month(s)  The format for your next appointment:   In Person  Provider:    Oswaldo Milian MD      Signed, Donato Heinz, MD  03/29/2021 11:18 PM    [place]

## 2021-03-29 ENCOUNTER — Other Ambulatory Visit: Payer: Self-pay

## 2021-03-29 ENCOUNTER — Ambulatory Visit (INDEPENDENT_AMBULATORY_CARE_PROVIDER_SITE_OTHER): Payer: Medicare Other | Admitting: Cardiology

## 2021-03-29 ENCOUNTER — Encounter: Payer: Self-pay | Admitting: Cardiology

## 2021-03-29 VITALS — BP 164/92 | HR 90 | Ht 65.0 in | Wt 196.2 lb

## 2021-03-29 DIAGNOSIS — M79604 Pain in right leg: Secondary | ICD-10-CM

## 2021-03-29 DIAGNOSIS — I25118 Atherosclerotic heart disease of native coronary artery with other forms of angina pectoris: Secondary | ICD-10-CM

## 2021-03-29 DIAGNOSIS — E785 Hyperlipidemia, unspecified: Secondary | ICD-10-CM

## 2021-03-29 DIAGNOSIS — M79605 Pain in left leg: Secondary | ICD-10-CM | POA: Diagnosis not present

## 2021-03-29 DIAGNOSIS — N280 Ischemia and infarction of kidney: Secondary | ICD-10-CM

## 2021-03-29 DIAGNOSIS — I10 Essential (primary) hypertension: Secondary | ICD-10-CM

## 2021-03-29 DIAGNOSIS — I5032 Chronic diastolic (congestive) heart failure: Secondary | ICD-10-CM

## 2021-03-29 MED ORDER — AMLODIPINE BESYLATE 5 MG PO TABS: 5.0000 mg | ORAL_TABLET | Freq: Every day | ORAL | 3 refills | Status: DC

## 2021-03-29 NOTE — Patient Instructions (Signed)
Medication Instructions:   START AMLODIPINE 5 MG ONCE DAILY  *If you need a refill on your cardiac medications before your next appointment, please call your pharmacy*   Lab Work:  Your physician recommends that you return for lab work FASTING  If you have labs (blood work) drawn today and your tests are completely normal, you will receive your results only by: [place] (if you have MyChart) OR A paper copy in the mail If you have any lab test that is abnormal or we need to change your treatment, we will call you to review the results.   Testing/Procedures:  Your physician has requested that you have an ankle brachial index (ABI). During this test an ultrasound and blood pressure cuff are used to evaluate the arteries that supply the arms and legs with blood. Allow thirty minutes for this exam. There are no restrictions or special instructions. [facility]   Follow-Up: At [facility], you and your health needs are our priority.  As part of our continuing mission to provide you with exceptional heart care, we have created designated Provider Care Teams.  These Care Teams include your primary Cardiologist (physician) and Advanced Practice Providers (APPs -  Physician Assistants and Nurse Practitioners) who all work together to provide you with the care you need, when you need it.  We recommend signing up for the patient portal called "MyChart".  Sign up information is provided on this After Visit Summary.  MyChart is used to connect with patients for Virtual Visits (Telemedicine).  Patients are able to view lab/test results, encounter notes, upcoming appointments, etc.  Non-urgent messages can be sent to your provider as well.   To learn more about what you can do with MyChart, go to [email or web address].    2 WEEKS APPOINTMENT WITH THE PHARMACIST FOR BLOOD PRESSURE  Your next appointment:   6 month(s)  The format for your next appointment:   In Person  Provider:    Oswaldo Milian MD

## 2021-03-30 ENCOUNTER — Telehealth: Payer: Self-pay | Admitting: Cardiology

## 2021-03-30 NOTE — Telephone Encounter (Signed)
Patient was returning phone call 

## 2021-03-30 NOTE — Telephone Encounter (Signed)
Returned call to pt, looks like she needs to be scheduled for VAS [place] LOWER EXTREMITY ARTERIAL DUPLEX

## 2021-04-01 ENCOUNTER — Telehealth: Payer: Self-pay | Admitting: Cardiology

## 2021-04-01 NOTE — Telephone Encounter (Signed)
Pt is calling back to advise the expiration date of the heart patch 05/02/2021

## 2021-04-01 NOTE — Telephone Encounter (Signed)
Called patient back, LVM to get more information- left call back number.

## 2021-04-06 ENCOUNTER — Other Ambulatory Visit: Payer: Self-pay

## 2021-04-06 ENCOUNTER — Encounter (HOSPITAL_COMMUNITY): Payer: Self-pay | Admitting: Emergency Medicine

## 2021-04-06 ENCOUNTER — Ambulatory Visit (HOSPITAL_COMMUNITY)
Admission: EM | Admit: 2021-04-06 | Discharge: 2021-04-06 | Disposition: A | Discharge status: Home / Self Care | Payer: Medicare Other | Attending: Emergency Medicine | Admitting: Emergency Medicine

## 2021-04-06 DIAGNOSIS — M25559 Pain in unspecified hip: Secondary | ICD-10-CM

## 2021-04-06 LAB — POCT URINALYSIS DIPSTICK, ED / UC
Bilirubin Urine: NEGATIVE
Glucose, UA: NEGATIVE mg/dL
Hgb urine dipstick: NEGATIVE
Ketones, ur: NEGATIVE mg/dL
Leukocytes,Ua: NEGATIVE
Nitrite: NEGATIVE
Protein, ur: NEGATIVE mg/dL
Specific Gravity, Urine: 1.015 (ref 1.005–1.030)
Urobilinogen, UA: 0.2 mg/dL (ref 0.0–1.0)
pH: 5.5 (ref 5.0–8.0)

## 2021-04-06 MED ORDER — TRAMADOL HCL 50 MG PO TABS
50.0000 mg | ORAL_TABLET | Freq: Two times a day (BID) | ORAL | 0 refills | Status: DC | PRN
Start: 2021-04-06 — End: 2021-06-15

## 2021-04-06 NOTE — ED Provider Notes (Signed)
[place]    CSN: [id number] Arrival date & time: 04/06/21  1559      History   Chief Complaint Chief Complaint  Patient presents with   Hip Pain   Leg Pain    HPI [name] is a 71 y.o. female.  She reports left side trunk pain for last 3 days.  Pain is worse in the morning.  Pain is constant.  She denies having pain like this in the past.  Has been using icy hot and stretching which helps relieve the pain somewhat and allows her to get up and move around which also helps relieve pain.  Patient believes she has a kidney infection and would like urinalysis.  Denies abdominal pain, nausea, vomiting, dysuria, hematuria.  However patient believes she has a kidney infection because pain is similar to when she had a kidney infection approximately 12 years ago.  Review of records shows a hip x-ray from 2021 demonstrates bilateral osteoarthritis.    Hip Pain Pertinent negatives include no abdominal pain.  Leg Pain Associated symptoms: no fever    Past Medical History:  Diagnosis Date   Abnormal EKG 2012   hospitalized for T wave inversion in lateral leads with MSK chest pains, normal ECHO, negative trops, no cardiology consult. recent EKG 7/15 stable T wave inversions.    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   blood clot in the kidney    Patient Active Problem List   Diagnosis Date Noted   Daytime somnolence 01/12/2021   Atypical chest pain 12/13/2020   Abnormal cardiac CT angiography 12/13/2020   Left elbow pain 03/31/2020   Contact dermatitis 01/14/2019   Vaginal itching 12/05/2018   Pelvic pain 10/07/2018   Leg edema 08/28/2018   Thickened endometrium    Umbilical hernia without obstruction and without gangrene 01/01/2018   Osteopenia 09/05/2017    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   HLD (hyperlipidemia)    Aortic atherosclerosis ([place])    Back pain 07/08/2015   Scotoma of blind spot area in visual field 06/14/2015   Bilateral leg pain 06/14/2015   Seasonal allergies 03/09/2015   Hypopigmentation 03/09/2015   Osteoarthritis of left knee 07/08/2014   DJD (degenerative joint disease), lumbar 07/08/2014   Osteoarthritis of right knee 07/08/2014   Urinary frequency 06/11/2014   Healthcare maintenance 06/11/2014   Allergic rhinoconjunctivitis of both eyes 06/11/2014   Nearsightedness 04/27/2014   Abnormal EKG    Hypertension 12/11/2013    Past Surgical History:  Procedure Laterality Date   ABDOMINAL SURGERY  2010   for bowel blockage   COLON SURGERY  2010   bowel blockage   COLONOSCOPY  09/2014   HYSTEROSCOPY WITH D & C N/A 05/08/2018   Procedure: DILATATION AND CURETTAGE /HYSTEROSCOPY;  Surgeon: Mora Bellman, MD;  Location: [facility];  Service: Gynecology;  Laterality: N/A;   KNEE ARTHROSCOPY Left 09/25/2013   Procedure: LEFT KNEE ARTHROSCOPY WITH PARTIAL MEDIAL AND LATERAL  MENISCECTOMY/DEBRIDEMENT/MICRO FRACTURE MEDIAL FEMORAL CONDYLE, REMOVAL OF OSTEOCONDRAL FRAGMENT;  Surgeon: Johnn Hai, MD;  Location: [facility];  Service: Orthopedics;  Laterality: Left;   KNEE SURGERY Right 1999   LEFT HEART CATH AND CORONARY ANGIOGRAPHY N/A 12/13/2020  Procedure: LEFT HEART CATH AND CORONARY ANGIOGRAPHY;  Surgeon: Nelva Bush, MD;  Location: [facility];  Service: Cardiovascular;  Laterality: N/A;   POLYPECTOMY     TEE WITHOUT CARDIOVERSION N/A 10/04/2015   Procedure: TRANSESOPHAGEAL ECHOCARDIOGRAM (TEE);  Surgeon: Sueanne Margarita, MD;  [facility];  Service: Cardiovascular;  Laterality: N/A;   TUBAL LIGATION  1978    OB History     Gravida  2   Para  2   Term      Preterm      AB      Living  2      SAB      IAB      Ectopic      Multiple       Live Births               Home Medications    Prior to Admission medications   Medication Sig Start Date End Date Taking? Authorizing Provider  traMADol (ULTRAM) 50 MG tablet Take 1 tablet (50 mg total) by mouth 2 (two) times daily as needed for moderate pain. 04/06/21  Yes Carvel Getting, NP  amLODipine (NORVASC) 5 MG tablet Take 1 tablet (5 mg total) by mouth daily. 03/29/21 06/27/21  Donato Heinz, MD  aspirin EC 81 MG tablet Take 1 tablet (81 mg total) daily by mouth. 01/31/17   Clent Demark, PA-C  atorvastatin (LIPITOR) 80 MG tablet Take 1 tablet (80 mg total) by mouth daily at 6 PM. Patient taking differently: Take 40 mg by mouth daily at 6 PM. 12/02/20   Donato Heinz, MD  carvedilol (COREG) 25 MG tablet Take 1 tablet (25 mg total) by mouth 2 (two) times daily with a meal. 12/07/20 12/02/21  Donato Heinz, MD  estradiol (ESTRACE VAGINAL) 0.1 MG/GM vaginal cream Place 1 Applicatorful vaginally at bedtime. 01/17/21   Elba Barman, DO  fluticasone (FLONASE) 50 MCG/ACT nasal spray Place 2 sprays into both nostrils 2 (two) times daily. 02/03/21   Gifford Shave, MD  furosemide (LASIX) 40 MG tablet Take 1 tablet (40 mg total) by mouth daily. 12/13/20 12/13/21  End, Harrell Gave, MD  gabapentin (NEURONTIN) 300 MG capsule Take 300 mg by mouth 2 (two) times daily. 11/14/19   [provider]  isosorbide mononitrate (IMDUR) 30 MG 24 hr tablet Take 0.5 tablets (15 mg total) by mouth daily. 12/13/20 12/13/21  End, Harrell Gave, MD  losartan (COZAAR) 100 MG tablet Take 1 tablet (100 mg total) by mouth daily. 12/29/20   Donato Heinz, MD  Multiple Vitamin (MULITIVITAMIN WITH MINERALS) TABS Take 1 tablet by mouth daily.    [provider]  nitroGLYCERIN (NITROSTAT) 0.4 MG SL tablet Place 1 tablet (0.4 mg total) under the tongue every 5 (five) minutes as needed. 12/07/20 03/07/21  Donato Heinz, MD  oseltamivir (TAMIFLU) 75 MG capsule Take  1 capsule (75 mg total) by mouth every 12 (twelve) hours. 01/29/21   Raspet, Derry Skill, PA-C  potassium chloride SA (KLOR-CON) 20 MEQ tablet TAKE 1 TABLET(20 MEQ) BY MOUTH DAILY 07/16/20   Gifford Shave, MD  promethazine-dextromethorphan (PROMETHAZINE-DM) 6.25-15 MG/5ML syrup Take 5 mLs by mouth 2 (two) times daily as needed for cough. 01/29/21   Raspet, Derry Skill, PA-C  triamcinolone cream (KENALOG) 0.1 % Apply 1 application topically daily as needed (Itching). 07/29/20   [provider]    Family History Family History  Problem Relation Age of Onset   Hypertension Mother  Heart disease Mother        has a pacemaker    Alzheimer's disease Mother    Hyperlipidemia Mother    Osteoporosis Mother    Heart failure Father    Hypertension Father    Alzheimer's disease Father    Hypertension Sister    Breast cancer Sister    Hypertension Sister    Gout Sister    Alcohol abuse Son    Pancreatitis Son    Cancer Neg Hx    Colon cancer Neg Hx    Esophageal cancer Neg Hx    Stomach cancer Neg Hx    Rectal cancer Neg Hx     Social History Social History   Tobacco Use   Smoking status: Former    Types: Cigarettes    Quit date: 07/29/2010    Years since quitting: 10.6   Smokeless tobacco: Never  Vaping Use   Vaping Use: Never used  Substance Use Topics   Alcohol use: Never    Alcohol/week: 0.0 standard drinks   Drug use: Never     Allergies   Shrimp [shellfish allergy], Latex, and Penicillins   Review of Systems Review of Systems  Constitutional:  Negative for chills and fever.  Gastrointestinal:  Negative for abdominal pain, constipation, diarrhea, nausea and vomiting.  Genitourinary:  Positive for flank pain. Negative for dysuria and hematuria.  Musculoskeletal:  Negative for gait problem.    Physical Exam Triage Vital Signs ED Triage Vitals  Enc Vitals Group     BP 04/06/21 1617 (!) 206/88     Pulse Rate 04/06/21 1617 72     Resp 04/06/21 1617 16     Temp  04/06/21 1617 98.6 F (37 C)     Temp Source 04/06/21 1617 Oral     SpO2 04/06/21 1617 96 %     Weight --      Height --      Head Circumference --      Peak Flow --      Pain Score 04/06/21 1615 7     Pain Loc --      Pain Edu? --      Excl. in [place]? --    No data found.  Updated Vital Signs BP (!) 203/83    Pulse 72    Temp 98.6 F (37 C) (Oral)    Resp 16    SpO2 96%   Visual Acuity Right Eye Distance:   Left Eye Distance:   Bilateral Distance:    Right Eye Near:   Left Eye Near:    Bilateral Near:     Physical Exam Constitutional:      Appearance: Normal appearance. She is obese.  Cardiovascular:     Rate and Rhythm: Normal rate and regular rhythm.  Pulmonary:     Effort: Pulmonary effort is normal.     Breath sounds: Normal breath sounds.  Abdominal:     General: Abdomen is flat. Bowel sounds are normal.     Palpations: Abdomen is soft.     Tenderness: There is no abdominal tenderness. There is no right CVA tenderness or left CVA tenderness.  Neurological:     Mental Status: She is alert.     UC Treatments / Results  Labs (all labs ordered are listed, but only abnormal results are displayed) Labs Reviewed  POCT URINALYSIS DIPSTICK, ED / UC    EKG   Radiology No results found.  Procedures Procedures (including critical care time)  Medications Ordered  in UC Medications - No data to display  Initial Impression / Assessment and Plan / UC Course  I have reviewed the triage vital signs and the nursing notes.  Pertinent labs & imaging results that were available during my care of the patient were reviewed by me and considered in my medical decision making (see chart for details).    After extensive discussion with patient, it appears her left sided pain is near the hip area.  Given that UA is negative, pain cannot be reproduced by palpation, and that pain is worse in the morning and improves with IcyHot, stretching, and moving, I suspect she is having  pain related to her hip arthritis.  Patient requests tramadol prescription which she has taken in the past for pain.  Review of prescription database for [facility] indicates she has not had an appropriate opioid prescriptions in the last year.  Encouraged patient to follow-up with her PCP as soon as possible for her pain.  Given prescription for 30 tramadol.  Final Clinical Impressions(s) / UC Diagnoses   Final diagnoses:  Hip pain     Discharge Instructions      Follow up with Family Medicine about your pain.    ED Prescriptions     Medication Sig Dispense Auth. Provider   traMADol (ULTRAM) 50 MG tablet Take 1 tablet (50 mg total) by mouth 2 (two) times daily as needed for moderate pain. 30 tablet Carvel Getting, NP      I have reviewed the PDMP during this encounter.   Carvel Getting, NP 04/06/21 1725

## 2021-04-06 NOTE — Discharge Instructions (Signed)
Follow up with Family Medicine about your pain.

## 2021-04-06 NOTE — ED Triage Notes (Signed)
PT reports left hip pain that started 4 days ago, no injury. Reports generalized pain in both legs and pain in both elbows.

## 2021-04-08 ENCOUNTER — Ambulatory Visit (HOSPITAL_COMMUNITY)
Admission: RE | Admit: 2021-04-08 | Discharge: 2021-04-08 | Disposition: A | Discharge status: Home / Self Care | Payer: Medicare Other | Source: Ambulatory Visit | Attending: Cardiology | Admitting: Cardiology

## 2021-04-08 ENCOUNTER — Other Ambulatory Visit: Payer: Self-pay

## 2021-04-08 DIAGNOSIS — M79605 Pain in left leg: Secondary | ICD-10-CM | POA: Diagnosis not present

## 2021-04-08 DIAGNOSIS — M79604 Pain in right leg: Secondary | ICD-10-CM | POA: Insufficient documentation

## 2021-04-11 NOTE — Telephone Encounter (Signed)
Left message for pt to call.

## 2021-04-13 NOTE — Telephone Encounter (Signed)
Attempted to call patient, left message for patient to call back to office if she is still having issues or concerns.  Call back number provided.

## 2021-04-14 ENCOUNTER — Telehealth: Payer: Self-pay | Admitting: *Deleted

## 2021-04-14 NOTE — Telephone Encounter (Signed)
A message was left re: scheduling an appointment with the pharmacist.

## 2021-04-15 NOTE — Telephone Encounter (Signed)
Pt returning phone call... please advise  

## 2021-04-22 ENCOUNTER — Telehealth: Payer: Self-pay

## 2021-04-22 ENCOUNTER — Telehealth: Payer: Self-pay | Admitting: Cardiology

## 2021-04-22 DIAGNOSIS — N280 Ischemia and infarction of kidney: Secondary | ICD-10-CM

## 2021-04-22 NOTE — Telephone Encounter (Signed)
The patient left a message stating she was supposed to get a call about her monitor but no one has called her. She is not a device clinic patient.

## 2021-04-22 NOTE — Telephone Encounter (Signed)
Return call to pt. She state she was told she would receive a new monitor but have yet to received it. Will forward to monitor department to check on the status.

## 2021-04-22 NOTE — Telephone Encounter (Signed)
A user error has taken place: encounter opened in error, closed for administrative reasons.

## 2021-04-22 NOTE — Telephone Encounter (Signed)
Please advise if you would like to reorder another monitor.     Huang, Denise N  You; [place], [place] A 2 hours ago (2:22 PM)   Im not sure who would have told her we are sending a new monitor? I dont see any notes in the computer about a new monitor needing to be mailed. The last order is from Aug and it looks like it was never worn/returned to [facility]. Since its been 5 months ago we would need a new order entered to have a new monitor mailed out to her.

## 2021-04-24 NOTE — Telephone Encounter (Signed)
Yes would recommend Zio patch x2 weeks

## 2021-04-25 ENCOUNTER — Other Ambulatory Visit: Payer: Self-pay | Admitting: Cardiology

## 2021-04-25 ENCOUNTER — Ambulatory Visit: Payer: Medicare Other

## 2021-04-25 DIAGNOSIS — I4891 Unspecified atrial fibrillation: Secondary | ICD-10-CM

## 2021-04-25 DIAGNOSIS — N280 Ischemia and infarction of kidney: Secondary | ICD-10-CM

## 2021-04-25 NOTE — Telephone Encounter (Signed)
Returned call to patient, advised her that new heart monitor has been ordered. Patient aware and verbalized understanding.

## 2021-04-25 NOTE — Addendum Note (Signed)
Addended by: Rexanne Mano B on: 04/25/2021 01:20 PM   Modules accepted: Orders

## 2021-04-25 NOTE — Progress Notes (Unsigned)
Enrolled for [employer] to mail a ZIO XT long term holter monitor to the patients address on file.  

## 2021-04-28 ENCOUNTER — Other Ambulatory Visit: Payer: Self-pay

## 2021-04-28 ENCOUNTER — Ambulatory Visit (INDEPENDENT_AMBULATORY_CARE_PROVIDER_SITE_OTHER): Payer: Medicare Other | Admitting: Pharmacist

## 2021-04-28 VITALS — BP 150/76 | HR 70 | Resp 14 | Ht 65.0 in | Wt 198.0 lb

## 2021-04-28 DIAGNOSIS — I10 Essential (primary) hypertension: Secondary | ICD-10-CM

## 2021-04-28 DIAGNOSIS — R079 Chest pain, unspecified: Secondary | ICD-10-CM

## 2021-04-28 MED ORDER — NITROGLYCERIN 0.4 MG SL SUBL: 0.4000 mg | SUBLINGUAL_TABLET | SUBLINGUAL | 3 refills | Status: DC | PRN

## 2021-04-28 MED ORDER — AMLODIPINE BESYLATE 10 MG PO TABS: 10.0000 mg | ORAL_TABLET | Freq: Every day | ORAL | 1 refills | Status: DC

## 2021-04-28 NOTE — Patient Instructions (Addendum)
It was nice meeting you today  We would like your blood pressure to be less than 130/80  Continue your:  Losartan 100mg  daily Furosemide 40mg  daily Imdur 15mg  daily  Increase your amlodipine to 10mg  once a day.  You can take two 5mg  tablets  Continue taking your blood pressure at home  I recommend heading to urgent care if your pain is too unbearable  Please call [phone] ask for Ivin Booty at [facility] will see you back in 4 weeks  Karren Cobble, PharmD, [place], [place], [place], [place], [place], [address] Phone: [phone], Fax: [phone]

## 2021-04-28 NOTE — Progress Notes (Deleted)
Patient ID: [name]                 DOB: 1950/07/11                    MRN: [id number]     HPI: [name] is a 71 y.o. female patient referred to lipid clinic by Dr Gardiner Rhyme. PMH is significant for CAD, CHF, HTN, HLD, renal infarct, and elevated coronary calcium score.   Current Medications:  Atorvastatin 80mg  daily  Intolerances:  Risk Factors:  LDL goal:   Diet:   Exercise:   Family History:   Social History:   Labs: TC 208, Trigs 275, LDL 109, HDL 52 (10/14/20  Coronary calcium score is 936, which places the patient in the 97th percentile for age and sex matched control.  Past Medical History:  Diagnosis Date   Abnormal EKG 2012   hospitalized for T wave inversion in lateral leads with MSK chest pains, normal ECHO, negative trops, no cardiology consult. recent EKG 7/15 stable T wave inversions.    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 180 tablet 3   aspirin EC 81 MG tablet Take 1 tablet (81 mg total) daily by mouth. 30 tablet 11   atorvastatin (LIPITOR) 80 MG tablet Take 1 tablet (80 mg total) by mouth daily at 6 PM. (Patient taking differently: Take 40 mg by mouth daily at 6 PM.) 90 tablet 3   carvedilol (COREG) 25 MG tablet Take 1 tablet (25 mg total) by mouth 2 (two) times daily with a meal. 180 tablet 3   estradiol (ESTRACE VAGINAL) 0.1 MG/GM vaginal cream Place 1 Applicatorful vaginally at bedtime. 42.5 g 2   fluticasone (FLONASE) 50 MCG/ACT nasal spray Place 2 sprays into both nostrils 2 (two) times daily. 18.2 mL 3   furosemide (LASIX) 40 MG tablet Take 1 tablet  (40 mg total) by mouth daily. 30 tablet 5   gabapentin (NEURONTIN) 300 MG capsule Take 300 mg by mouth 2 (two) times daily.     isosorbide mononitrate (IMDUR) 30 MG 24 hr tablet Take 0.5 tablets (15 mg total) by mouth daily. 30 tablet 5   losartan (COZAAR) 100 MG tablet Take 1 tablet (100 mg total) by mouth daily. 90 tablet 3   Multiple Vitamin (MULITIVITAMIN WITH MINERALS) TABS Take 1 tablet by mouth daily.     nitroGLYCERIN (NITROSTAT) 0.4 MG SL tablet Place 1 tablet (0.4 mg total) under the tongue every 5 (five) minutes as needed. 25 tablet 3   oseltamivir (TAMIFLU) 75 MG capsule Take 1 capsule (75 mg total) by mouth every 12 (twelve) hours. 10 capsule 0   potassium chloride SA (KLOR-CON) 20 MEQ tablet TAKE 1 TABLET(20 MEQ) BY MOUTH DAILY 90 tablet 3   promethazine-dextromethorphan (PROMETHAZINE-DM) 6.25-15 MG/5ML syrup Take 5 mLs by mouth 2 (two) times daily as needed for cough. 100 mL 0   traMADol (ULTRAM) 50 MG tablet Take 1 tablet (50 mg total) by mouth 2 (two) times daily as needed  for moderate pain. 30 tablet 0   triamcinolone cream (KENALOG) 0.1 % Apply 1 application topically daily as needed (Itching).     No current facility-administered medications on file prior to visit.    Allergies  Allergen Reactions   Shrimp [Shellfish Allergy] Hives and Itching   Latex Itching   Penicillins Itching and Rash    Has patient had a PCN reaction causing immediate rash, facial/tongue/throat swelling, SOB or lightheadedness with hypotension: {Yes/No:30480221} Has patient had a PCN reaction causing severe rash involving mucus membranes or skin necrosis: {Yes/No:30480221} Has patient had a PCN reaction that required hospitalization {Yes/No:30480221} Has patient had a PCN reaction occurring within the last 10 years: {Yes/No:30480221} If all of the above answers are "NO", then may proceed with Cephalosporin use.    Assessment/Plan:  1. Hyperlipidemia -

## 2021-04-28 NOTE — Progress Notes (Signed)
Patient ID: [name]                 DOB: 1950-07-02                      MRN: [id number]     HPI: [name] is a 71 y.o. female referred by Dr. Gardiner Rhyme to HTN clinic. PMH is significant for HTN, CAD, elevated coronary calcium score, renal infarct, HLD, OSA, and history of smoking. Patient seen by Dr Gardiner Rhyme on 03/29/21 and started on amlodipine 5mg  daily.  Patient presents today tearful. Reports she is in an immense amoutn of hip and back pain and no is helping her.  Went to ER and given 30 days of tramadol which she finished. Requested refill which I advised her I could not do. Advised if her pain level was this high she would need to go to urgent care or ER.  Showed patient current wait times for Urgent Care and [facility] urgent care was only a 15 minute wait.  Patient was then no longer tearful.  Has PCP appointment next week  Reports she has been testing her blood pressure at home but did not bring machine or log. By memory she states her last two readings were 154/90 and 152/95.  Reports no adverse effects since starting amlodipine. Reports she took it in the past but is not sure why it was discontinued.  Patient diagnosed with OSA but has not received CPAP machine yet.  Current HTN meds:  Amlodipine 5mg  daily Losartan 100mg  daily Furosemide 40mg  daily Imdur 15mg  daily Carvedilol 25mg  BID  BP goal: <130/80   Wt Readings from Last 3 Encounters:  03/29/21 196 lb 3.2 oz (89 kg)  02/01/21 198 lb 12.8 oz (90.2 kg)  01/12/21 190 lb (86.2 kg)   BP Readings from Last 3 Encounters:  04/06/21 (!) 203/83  03/29/21 (!) 164/92  01/29/21 (!) 152/71   Pulse Readings from Last 3 Encounters:  04/06/21 72  03/29/21 90  01/29/21 60    Renal function: CrCl cannot be calculated (Patient's most recent lab result is older than the maximum 21 days allowed.).  Past Medical History:  Diagnosis Date   Abnormal EKG 2012   hospitalized for T wave inversion in lateral leads with  MSK chest pains, normal ECHO, negative trops, no cardiology consult. recent EKG 7/15 stable T wave inversions.    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 180 tablet 3   aspirin EC 81 MG tablet Take 1 tablet (81 mg total) daily by mouth. 30 tablet 11   atorvastatin (LIPITOR) 80 MG tablet Take 1 tablet (80 mg total) by mouth daily at 6 PM. (Patient taking differently: Take 40 mg by mouth daily at 6 PM.) 90 tablet 3   carvedilol (COREG) 25 MG tablet Take 1 tablet (25 mg total) by mouth 2 (two) times daily with a meal. 180 tablet 3   estradiol (ESTRACE VAGINAL) 0.1 MG/GM vaginal cream Place 1 Applicatorful vaginally at bedtime. 42.5  g 2   fluticasone (FLONASE) 50 MCG/ACT nasal spray Place 2 sprays into both nostrils 2 (two) times daily. 18.2 mL 3   furosemide (LASIX) 40 MG tablet Take 1 tablet (40 mg total) by mouth daily. 30 tablet 5   gabapentin (NEURONTIN) 300 MG capsule Take 300 mg by mouth 2 (two) times daily.     isosorbide mononitrate (IMDUR) 30 MG 24 hr tablet Take 0.5 tablets (15 mg total) by mouth daily. 30 tablet 5   losartan (COZAAR) 100 MG tablet Take 1 tablet (100 mg total) by mouth daily. 90 tablet 3   Multiple Vitamin (MULITIVITAMIN WITH MINERALS) TABS Take 1 tablet by mouth daily.     nitroGLYCERIN (NITROSTAT) 0.4 MG SL tablet Place 1 tablet (0.4 mg total) under the tongue every 5 (five) minutes as needed. 25 tablet 3   oseltamivir (TAMIFLU) 75 MG capsule Take 1 capsule (75 mg total) by mouth every 12 (twelve) hours. 10 capsule 0   potassium chloride SA (KLOR-CON) 20 MEQ tablet TAKE 1  TABLET(20 MEQ) BY MOUTH DAILY 90 tablet 3   promethazine-dextromethorphan (PROMETHAZINE-DM) 6.25-15 MG/5ML syrup Take 5 mLs by mouth 2 (two) times daily as needed for cough. 100 mL 0   traMADol (ULTRAM) 50 MG tablet Take 1 tablet (50 mg total) by mouth 2 (two) times daily as needed for moderate pain. 30 tablet 0   triamcinolone cream (KENALOG) 0.1 % Apply 1 application topically daily as needed (Itching).     No current facility-administered medications on file prior to visit.       Assessment/Plan:  1. Hypertension -  Patient BP in room 150/76 which is above goal of <130/80. Assessment difficult due to patient's emotional state however pain may be a contributing factor to her hypertension.  Recommended she explain her symptoms in detail to her PCP next week.  Advised that since she was above goal it was appropriate to increase amlodipine to max dose. Recommended she begin by taking two 5mg  tablets until she picks up refill.  Patient voiced understanding.  Checked with sleep coordinator and gave patient the contact information for [facility] to check the status of her CPAP.  Continue: Losartan 100mg  daily Furosemide 40mg  daily Imdur 15mg  daily Carvedilol 25mg  BID  Increase: Amlodipine to 10mg  daily  Recheck in clinic in 4 weeks  Karren Cobble, PharmD, [place], [place], [place], [place], [place], [address] Phone: [phone], Fax: [phone]

## 2021-05-04 ENCOUNTER — Other Ambulatory Visit: Payer: Self-pay

## 2021-05-04 ENCOUNTER — Encounter: Payer: Self-pay | Admitting: Family Medicine

## 2021-05-04 ENCOUNTER — Ambulatory Visit (INDEPENDENT_AMBULATORY_CARE_PROVIDER_SITE_OTHER): Payer: Medicare Other

## 2021-05-04 ENCOUNTER — Ambulatory Visit (INDEPENDENT_AMBULATORY_CARE_PROVIDER_SITE_OTHER): Payer: Medicare Other | Admitting: Family Medicine

## 2021-05-04 VITALS — BP 145/71 | HR 75 | Ht 65.0 in | Wt 196.4 lb

## 2021-05-04 DIAGNOSIS — Z23 Encounter for immunization: Secondary | ICD-10-CM

## 2021-05-04 DIAGNOSIS — M47816 Spondylosis without myelopathy or radiculopathy, lumbar region: Secondary | ICD-10-CM

## 2021-05-04 NOTE — Patient Instructions (Signed)
It was great seeing you today.  Your low back.  It is most likely from osteoarthritis and the best treatment for this increasing activity, weight loss.  You can also do heat packs, Tylenol.  Also continue using the diclofenac gel.  If your symptoms persist I recommend seeing an orthopedist.  If you have any questions or concerns call the clinic.  I hope you have a great afternoon!

## 2021-05-04 NOTE — Progress Notes (Signed)
° ° °  SUBJECTIVE:   CHIEF COMPLAINT / HPI:   Low back pain Patient presents for evaluation of her lower back pain.  She reports that this is a chronic issue which she has been seen for multiple times.  She has been using heat as well as Tylenol and diclofenac gel without much improvement.  Patient had an MRI of her lumbar spine in 2021 showing scoliosis with multilevel disc and facet degeneration.  She understands that she has some form of arthritis in her back which may be causing this low back pain.  Denies any systemic symptoms such as fever, chills.  No red flag symptoms OBJECTIVE:   BP (!) 145/71    Pulse 75    Ht 5\' 5"  (1.651 m)    Wt 196 lb 6 oz (89.1 kg)    SpO2 100%    BMI 32.68 kg/m   General: Pleasant 71 year old female Cardiac: Regular rate Respiratory: Normal breathing, lungs clear to auscultation bilaterally, speaking in full sentences without difficulty Abdomen: Soft, nontender MSK: Patient with diffuse low back muscular tenderness, no spinal point tenderness ASSESSMENT/PLAN:   DJD (degenerative joint disease), lumbar Patient with known degenerative joint disease in her lumbar spine.  Complaining of bilateral low back pain.  Physical exam showing no bony point tenderness or red flags.  Discussed supportive care including continued heat, Tylenol, topical treatments.  Patient will continue to do this and follow-up as needed.       Gifford Shave, MD [place]

## 2021-05-05 NOTE — Assessment & Plan Note (Signed)
Patient with known degenerative joint disease in her lumbar spine.  Complaining of bilateral low back pain.  Physical exam showing no bony point tenderness or red flags.  Discussed supportive care including continued heat, Tylenol, topical treatments.  Patient will continue to do this and follow-up as needed.

## 2021-05-10 ENCOUNTER — Other Ambulatory Visit: Payer: Self-pay

## 2021-05-19 DIAGNOSIS — G4733 Obstructive sleep apnea (adult) (pediatric): Secondary | ICD-10-CM | POA: Diagnosis not present

## 2021-05-26 ENCOUNTER — Encounter: Payer: Self-pay | Admitting: Pharmacist

## 2021-05-26 ENCOUNTER — Other Ambulatory Visit: Payer: Self-pay

## 2021-05-26 ENCOUNTER — Ambulatory Visit (INDEPENDENT_AMBULATORY_CARE_PROVIDER_SITE_OTHER): Payer: Medicare Other | Admitting: Pharmacist

## 2021-05-26 VITALS — BP 154/74 | HR 64 | Resp 16 | Ht 65.0 in | Wt 198.8 lb

## 2021-05-26 DIAGNOSIS — I10 Essential (primary) hypertension: Secondary | ICD-10-CM | POA: Diagnosis not present

## 2021-05-26 MED ORDER — HYDROCHLOROTHIAZIDE 25 MG PO TABS: 25.0000 mg | ORAL_TABLET | Freq: Every day | ORAL | 1 refills | Status: DC

## 2021-05-26 NOTE — Progress Notes (Signed)
Patient ID: [name]                 DOB: 1950-12-15                      MRN: [id number] ? ? ? ? ?HPI: ?[name] is a 71 y.o. female referred by Dr. Gardiner Rhyme to HTN clinic. PMH is significant for HTN, CAD, elevated coronary calcium score, renal infarct, HLD, OSA, and history of smoking. Patient seen by Dr Gardiner Rhyme on 03/29/21 and started on amlodipine 5mg  daily. At last visit in HTN clinic amlodipine was increased to 10mg  daily.   ? ?Patient presents today in good spirits.  Reports medication compliance however blood pressure has not changed significantly since increasing amlodipine to 10mg  daily.   ? ?Did not bring blood pressure cuff or log and does not recll any recent readings. ? ? ?Current HTN meds:  ?Amlodipine 10mg  daily ?Losartan 100mg  daily ?Furosemide 40mg  daily ?Imdur 15mg  daily ?Carvedilol 25mg  BID ? ?BP goal: <130/80 ? ?Wt Readings from Last 3 Encounters:  ?05/04/21 196 lb 6 oz (89.1 kg)  ?04/28/21 198 lb (89.8 kg)  ?03/29/21 196 lb 3.2 oz (89 kg)  ? ?BP Readings from Last 3 Encounters:  ?05/04/21 (!) 145/71  ?04/28/21 (!) 150/76  ?04/06/21 (!) 203/83  ? ?Pulse Readings from Last 3 Encounters:  ?05/04/21 75  ?04/28/21 70  ?04/06/21 72  ? ? ?Renal function: ?CrCl cannot be calculated (Patient's most recent lab result is older than the maximum 21 days allowed.). ? ?Past Medical History:  ?Diagnosis Date  ? Abnormal EKG 2012  ? hospitalized for T wave inversion in lateral leads with MSK chest pains, normal ECHO, negative trops, no cardiology consult. recent EKG 7/15 stable T wave inversions.   ? 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  ? Arthritis 2000  ? Arthritis 2011  ? Clotting disorder ([place])   ? blood clot kidney 2017  ? Heart murmur   ? Hyperlipidemia 2011  ? Hypertension 2011  ? Meniscus tear 2000  ? L KNEE  ? 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 blood clot in the kidney  ? ? ?Current Outpatient Medications on File Prior to Visit  ?Medication Sig Dispense Refill  ? amLODipine (NORVASC) 10 MG tablet Take 1  tablet (10 mg total) by mouth daily. 90 tablet 1  ? aspirin EC 81 MG tablet Take 1 tablet (81 mg total) daily by mouth. 30 tablet 11  ? atorvastatin (LIPITOR) 80 MG tablet Take 1 tablet (80 mg total) by mouth daily at 6 PM. (Patient taking differently: Take 40 mg by mouth daily at 6 PM.) 90 tablet 3  ? carvedilol (COREG) 25 MG tablet Take 1 tablet (25 mg total) by mouth 2 (two) times daily with a meal. 180 tablet 3  ? estradiol (ESTRACE VAGINAL) 0.1 MG/GM vaginal cream Place 1 Applicatorful vaginally at bedtime. 42.5 g 2  ? fluticasone (FLONASE) 50 MCG/ACT nasal spray Place 2 sprays into both nostrils 2 (two) times daily. 18.2 mL 3  ? furosemide (LASIX) 40 MG tablet Take 1 tablet (40 mg total) by mouth daily. 30 tablet 5  ? gabapentin (NEURONTIN) 300 MG capsule Take 300 mg by mouth 2 (two) times daily.    ? isosorbide mononitrate (IMDUR) 30 MG 24 hr tablet Take 0.5 tablets (15 mg total) by mouth daily. 30 tablet 5  ? losartan (COZAAR) 100 MG tablet Take 1 tablet (100 mg total) by mouth daily. 90 tablet 3  ? Multiple  Vitamin (MULITIVITAMIN WITH MINERALS) TABS Take 1 tablet by mouth daily.    ? nitroGLYCERIN (NITROSTAT) 0.4 MG SL tablet Place 1 tablet (0.4 mg total) under the tongue every 5 (five) minutes as needed. 25 tablet 3  ? potassium chloride SA (KLOR-CON) 20 MEQ tablet TAKE 1 TABLET(20 MEQ) BY MOUTH DAILY 90 tablet 3  ? promethazine-dextromethorphan (PROMETHAZINE-DM) 6.25-15 MG/5ML syrup Take 5 mLs by mouth 2 (two) times daily as needed for cough. (Patient not taking: Reported on 04/28/2021) 100 mL 0  ? traMADol (ULTRAM) 50 MG tablet Take 1 tablet (50 mg total) by mouth 2 (two) times daily as needed for moderate pain. (Patient not taking: Reported on 04/28/2021) 30 tablet 0  ? triamcinolone cream (KENALOG) 0.1 % Apply 1 application topically daily as needed (Itching).    ? ?No current facility-administered medications on file prior to visit.  ? ? ?Allergies  ?Allergen Reactions  ? Shrimp [Shellfish Allergy] Hives  and Itching  ? Latex Itching  ? Penicillins Itching and Rash  ?  Has patient had a PCN reaction causing immediate rash, facial/tongue/throat swelling, SOB or lightheadedness with hypotension: unknown ?Has patient had a PCN reaction causing severe rash involving mucus membranes or skin necrosis: unknown ?Has patient had a PCN reaction that required hospitalization unknown ?Has patient had a PCN reaction occurring within the last 10 years: No ?If all of the above answers are "NO", then may proceed with Cephalosporin use.  ? ? ? ?Assessment/Plan: ? ?1. Hypertension -  Patient BP in room 154/74 which is above goal of <130/80.  Questionable medication compliance.  Patient previously on HCTZ but does not know why it was discontinued.  Will restart at this time and change furosemide to PRN. Patient has no swelling.  Recheck BMP in 2 weeks, recheck in clinic in 4 weeks ? ?Continue: ?Amlodipine 10mg  daily ?Losartan 100mg  daily ?Imdur 15mg  daily ?Carvedilol 25mg  BID ? ?Change furosemide to 40mg  PRN swelling ?Start HCTZ 25mg  daily ?Check Bmp in 2 weeks ?Recheck in 4 weeks ? ?Karren Cobble, PharmD, BCACP, [place], CPP ?[address], [address] ?[place], [place], [address] ?Phone: [phone], Fax: [phone]  ?

## 2021-05-26 NOTE — Patient Instructions (Addendum)
It was nice seeing you again ? ?We would like your blood pressure to be less than 130/80 ? ?Please continue your ? ?Amlodipine 10mg  daily ?Losartan 100mg  daily ?Imdur 15mg  daily ?Carvedilol 25mg  twice a day ? ?You can start taking your furosemide 40mg  as needed ? ?We will restart your hydrochlorothiazide 25mg  once a day ? ?Please update your lab work in about 2 weeks ? ?Karren Cobble, PharmD, BCACP, [place], CPP ?[address], [address] ?[place], [place], [address] ?Phone: [phone], Fax: [phone]  ?

## 2021-06-09 ENCOUNTER — Telehealth: Payer: Self-pay | Admitting: Pharmacist

## 2021-06-09 NOTE — Telephone Encounter (Signed)
Called and left message on machine for patient to update labs ?

## 2021-06-14 ENCOUNTER — Telehealth: Payer: Self-pay | Admitting: Cardiology

## 2021-06-14 DIAGNOSIS — I10 Essential (primary) hypertension: Secondary | ICD-10-CM | POA: Diagnosis not present

## 2021-06-14 DIAGNOSIS — I25118 Atherosclerotic heart disease of native coronary artery with other forms of angina pectoris: Secondary | ICD-10-CM | POA: Diagnosis not present

## 2021-06-14 NOTE — Telephone Encounter (Signed)
Patient calling in because she states she saw a different and smaller sleep machine on TV. And she is wondering if that something that can be prescribe to her instead of sleep machine she already has. Please advise  ?

## 2021-06-15 ENCOUNTER — Telehealth: Payer: Self-pay | Admitting: Cardiology

## 2021-06-15 ENCOUNTER — Ambulatory Visit (HOSPITAL_COMMUNITY)
Admission: EM | Admit: 2021-06-15 | Discharge: 2021-06-15 | Disposition: A | Discharge status: Home / Self Care | Payer: Medicare Other | Attending: Nurse Practitioner | Admitting: Nurse Practitioner

## 2021-06-15 ENCOUNTER — Ambulatory Visit (INDEPENDENT_AMBULATORY_CARE_PROVIDER_SITE_OTHER): Payer: Medicare Other

## 2021-06-15 ENCOUNTER — Encounter (HOSPITAL_COMMUNITY): Payer: Self-pay | Admitting: Emergency Medicine

## 2021-06-15 DIAGNOSIS — G8929 Other chronic pain: Secondary | ICD-10-CM

## 2021-06-15 DIAGNOSIS — M25552 Pain in left hip: Secondary | ICD-10-CM | POA: Diagnosis not present

## 2021-06-15 DIAGNOSIS — R102 Pelvic and perineal pain: Secondary | ICD-10-CM | POA: Diagnosis not present

## 2021-06-15 DIAGNOSIS — R109 Unspecified abdominal pain: Secondary | ICD-10-CM | POA: Diagnosis not present

## 2021-06-15 LAB — BASIC METABOLIC PANEL
BUN/Creatinine Ratio: 18 (ref 12–28)
BUN: 19 mg/dL (ref 8–27)
CO2: 26 mmol/L (ref 20–29)
Calcium: 10 mg/dL (ref 8.7–10.3)
Chloride: 102 mmol/L (ref 96–106)
Creatinine, Ser: 1.08 mg/dL — ABNORMAL HIGH (ref 0.57–1.00)
Glucose: 83 mg/dL (ref 70–99)
Potassium: 4.2 mmol/L (ref 3.5–5.2)
Sodium: 143 mmol/L (ref 134–144)
eGFR: 55 mL/min/{1.73_m2} — ABNORMAL LOW (ref >59)

## 2021-06-15 LAB — POCT URINALYSIS DIPSTICK, ED / UC
Bilirubin Urine: NEGATIVE
Glucose, UA: NEGATIVE mg/dL
Hgb urine dipstick: NEGATIVE
Ketones, ur: NEGATIVE mg/dL
Leukocytes,Ua: NEGATIVE
Nitrite: NEGATIVE
Protein, ur: NEGATIVE mg/dL
Specific Gravity, Urine: 1.02 (ref 1.005–1.030)
Urobilinogen, UA: 0.2 mg/dL (ref 0.0–1.0)
pH: 5 (ref 5.0–8.0)

## 2021-06-15 LAB — LIPID PANEL
Chol/HDL Ratio: 3.8 ratio (ref 0.0–4.4)
Cholesterol, Total: 204 mg/dL — ABNORMAL HIGH (ref 100–199)
HDL: 53 mg/dL (ref >39)
LDL Chol Calc (NIH): 123 mg/dL — ABNORMAL HIGH (ref 0–99)
Triglycerides: 158 mg/dL — ABNORMAL HIGH (ref 0–149)
VLDL Cholesterol Cal: 28 mg/dL (ref 5–40)

## 2021-06-15 LAB — MAGNESIUM: Magnesium: 2 mg/dL (ref 1.6–2.3)

## 2021-06-15 MED ORDER — TRAMADOL HCL 50 MG PO TABS
50.0000 mg | ORAL_TABLET | Freq: Two times a day (BID) | ORAL | 0 refills | Status: DC | PRN
Start: 2021-06-15 — End: 2021-06-23

## 2021-06-15 NOTE — ED Triage Notes (Signed)
Pt reports left flank pain and lower abdomen pain x 15 days. States she was here before for same and the pain has gotten worse. Requesting x-ray.  ?

## 2021-06-15 NOTE — Telephone Encounter (Signed)
Spoke to patient lab results given.Stated she is taking Atorvastatin 80 mg every day.Advised I will send message to [place] for advice. ? ?Stated she does not like her cpap machine.She wants to try a monitor that has been advertised on TV.Advised I will send message to our sleep coordinator.  ?

## 2021-06-15 NOTE — ED Provider Notes (Signed)
[facility]    CSN: [id number] Arrival date & time: 06/15/21  1417      History   Chief Complaint Chief Complaint  Patient presents with   Abdominal Pain   Flank Pain    HPI Tiosha Huss is a 71 y.o. female.   The patient is a 71 year old female who presents with left-sided trunk pain, and pelvic pain.  She states the trunk pain has been present for several months.  She states the pelvic pain started over the past week.  The patient informs that she was seen for her trunk pain, and she has not had improvement since she was seen.  The patient states the pain is constant, and nothing makes it worse or better.  She denies fever, chills, urinary symptoms, injury, or trauma.  She states that the pain is in her left buttock, when she describes and locates the pain.  She states the burning in her pelvic region is constant as well, and is not related to urination or other symptoms.  She also complains of feeling bloated.  Patient asked if she could have a bowel blockage again, as she had one in the past.  She does have a history of osteoarthritis in both knees and in the left hip.  Patient does asked why she is walking to one side.  When patient was seen back in January, 2023, she was informed that her symptoms were related to her osteoarthritis.  She also has a history of osteoarthritis in her knees.  She has not seen or followed up with anyone since she was seen at that time.  Patient states she was like an x-ray today for her symptoms.  Attempted to explain that an x-ray would not be helpful, but patient still wanted to move forward.   Abdominal Pain Associated symptoms: no nausea and no vomiting   Flank Pain Associated symptoms include abdominal pain.   Past Medical History:  Diagnosis Date   Abnormal EKG 2012   hospitalized for T wave inversion in lateral leads with MSK chest pains, normal ECHO, negative trops, no cardiology consult. recent EKG 7/15 stable T wave inversions.     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   Arthritis 2000   Arthritis 2011   Clotting disorder ([facility])    blood clot kidney 2017   Heart murmur    Hyperlipidemia 2011   Hypertension 2011   Meniscus tear 2000   L KNEE   Renal infarct ([facility]) 2017   blood clot in the kidney    Patient Active Problem List   Diagnosis Date Noted   Daytime somnolence 01/12/2021   Atypical chest pain 12/13/2020   Abnormal cardiac CT angiography 12/13/2020   Left elbow pain 03/31/2020   Contact dermatitis 01/14/2019   Vaginal itching 12/05/2018   Pelvic pain 10/07/2018   Leg edema 08/28/2018   Thickened endometrium    Umbilical hernia without obstruction and without gangrene 01/01/2018   Osteopenia 09/05/2017   Closed nondisplaced spiral fracture of shaft of right tibia 03/28/2017   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/30/2015   HLD (hyperlipidemia)    Aortic atherosclerosis ([facility])    Back pain 07/08/2015   Scotoma of blind spot area in visual field 06/14/2015   Bilateral leg pain 06/14/2015   Seasonal allergies 03/09/2015   Hypopigmentation 03/09/2015   Osteoarthritis of left knee 07/08/2014   DJD (degenerative joint disease), lumbar 07/08/2014   Osteoarthritis of right knee 07/08/2014   Urinary frequency 06/11/2014  Healthcare maintenance 06/11/2014   Allergic rhinoconjunctivitis of both eyes 06/11/2014   Nearsightedness 04/27/2014   Abnormal EKG    Hypertension 12/11/2013    Past Surgical History:  Procedure Laterality Date   ABDOMINAL SURGERY  2010   for bowel blockage   COLON SURGERY  2010   bowel blockage   COLONOSCOPY  09/2014   HYSTEROSCOPY WITH D & C N/A 05/08/2018   Procedure: DILATATION AND CURETTAGE /HYSTEROSCOPY;  Surgeon: Catalina Antigua, MD;  Location: [facility];  Service: Gynecology;  Laterality: N/A;   KNEE ARTHROSCOPY Left 09/25/2013   Procedure: LEFT KNEE ARTHROSCOPY WITH PARTIAL MEDIAL AND LATERAL  MENISCECTOMY/DEBRIDEMENT/MICRO  FRACTURE MEDIAL FEMORAL CONDYLE, REMOVAL OF OSTEOCONDRAL FRAGMENT;  Surgeon: Javier Docker, MD;  Location: [facility];  Service: Orthopedics;  Laterality: Left;   KNEE SURGERY Right 1999   LEFT HEART CATH AND CORONARY ANGIOGRAPHY N/A 12/13/2020   Procedure: LEFT HEART CATH AND CORONARY ANGIOGRAPHY;  Surgeon: Yvonne Kendall, MD;  Location: [facility];  Service: Cardiovascular;  Laterality: N/A;   POLYPECTOMY     TEE WITHOUT CARDIOVERSION N/A 10/04/2015   Procedure: TRANSESOPHAGEAL ECHOCARDIOGRAM (TEE);  Surgeon: Quintella Reichert, MD;  Location: [facility];  Service: Cardiovascular;  Laterality: N/A;   TUBAL LIGATION  1978    OB History     Gravida  2   Para  2   Term      Preterm      AB      Living  2      SAB      IAB      Ectopic      Multiple      Live Births               Home Medications    Prior to Admission medications   Medication Sig Start Date End Date Taking? Authorizing Provider  amLODipine (NORVASC) 10 MG tablet Take 1 tablet (10 mg total) by mouth daily. 04/28/21   Little Ishikawa, MD  aspirin EC 81 MG tablet Take 1 tablet (81 mg total) daily by mouth. 01/31/17   Loletta Specter, PA-C  atorvastatin (LIPITOR) 80 MG tablet Take 1 tablet (80 mg total) by mouth daily at 6 PM. Patient taking differently: Take 40 mg by mouth daily at 6 PM. 12/02/20   Little Ishikawa, MD  carvedilol (COREG) 25 MG tablet Take 1 tablet (25 mg total) by mouth 2 (two) times daily with a meal. 12/07/20 12/02/21  Little Ishikawa, MD  estradiol (ESTRACE VAGINAL) 0.1 MG/GM vaginal cream Place 1 Applicatorful vaginally at bedtime. 01/17/21   Madelyn Brunner, DO  fluticasone (FLONASE) 50 MCG/ACT nasal spray Place 2 sprays into both nostrils 2 (two) times daily. 02/03/21   Derrel Nip, MD  furosemide (LASIX) 40 MG tablet Take 1 tablet (40 mg total) by mouth daily. 12/13/20 12/13/21  End, Cristal Deer, MD  gabapentin (NEURONTIN) 300 MG capsule Take 300 mg by  mouth 2 (two) times daily. 11/14/19   [provider]  hydrochlorothiazide (HYDRODIURIL) 25 MG tablet Take 1 tablet (25 mg total) by mouth daily. 05/26/21 08/24/21  Little Ishikawa, MD  isosorbide mononitrate (IMDUR) 30 MG 24 hr tablet Take 0.5 tablets (15 mg total) by mouth daily. 12/13/20 12/13/21  End, Cristal Deer, MD  losartan (COZAAR) 100 MG tablet Take 1 tablet (100 mg total) by mouth daily. 12/29/20   Little Ishikawa, MD  Multiple Vitamin (MULITIVITAMIN WITH MINERALS) TABS Take 1 tablet by mouth daily.  [provider]  nitroGLYCERIN (NITROSTAT) 0.4 MG SL tablet Place 1 tablet (0.4 mg total) under the tongue every 5 (five) minutes as needed. 04/28/21 07/27/21  Little Ishikawa, MD  potassium chloride SA (KLOR-CON) 20 MEQ tablet TAKE 1 TABLET(20 MEQ) BY MOUTH DAILY 07/16/20   Derrel Nip, MD  promethazine-dextromethorphan (PROMETHAZINE-DM) 6.25-15 MG/5ML syrup Take 5 mLs by mouth 2 (two) times daily as needed for cough. 01/29/21   Raspet, Noberto Retort, PA-C  traMADol (ULTRAM) 50 MG tablet Take 1 tablet (50 mg total) by mouth 2 (two) times daily as needed for moderate pain. 04/06/21   Cathlyn Parsons, NP  triamcinolone cream (KENALOG) 0.1 % Apply 1 application topically daily as needed (Itching). 07/29/20   [provider]    Family History Family History  Problem Relation Age of Onset   Hypertension Mother    Heart disease Mother        has a pacemaker    Alzheimer's disease Mother    Hyperlipidemia Mother    Osteoporosis Mother    Heart failure Father    Hypertension Father    Alzheimer's disease Father    Hypertension Sister    Breast cancer Sister    Hypertension Sister    Gout Sister    Alcohol abuse Son    Pancreatitis Son    Cancer Neg Hx    Colon cancer Neg Hx    Esophageal cancer Neg Hx    Stomach cancer Neg Hx    Rectal cancer Neg Hx     Social History Social History   Tobacco Use   Smoking status: Former    Types: Cigarettes     Quit date: 07/29/2010    Years since quitting: 10.8   Smokeless tobacco: Never  Vaping Use   Vaping Use: Never used  Substance Use Topics   Alcohol use: Never    Alcohol/week: 0.0 standard drinks   Drug use: Never     Allergies   Shrimp [shellfish allergy], Latex, and Penicillins   Review of Systems Review of Systems  Constitutional: Negative.   Gastrointestinal:  Positive for abdominal pain. Negative for nausea and vomiting.  Genitourinary:  Positive for flank pain.  Skin: Negative.   Psychiatric/Behavioral: Negative.      Physical Exam Triage Vital Signs ED Triage Vitals  Enc Vitals Group     BP 06/15/21 1547 (!) 160/71     Pulse Rate 06/15/21 1547 62     Resp 06/15/21 1547 17     Temp 06/15/21 1547 97.6 F (36.4 C)     Temp Source 06/15/21 1547 Oral     SpO2 06/15/21 1547 100 %     Weight 06/15/21 1546 198 lb 12.8 oz (90.2 kg)     Height 06/15/21 1546 5\' 5"  (1.651 m)     Head Circumference --      Peak Flow --      Pain Score 06/15/21 1546 8     Pain Loc --      Pain Edu? --      Excl. in GC? --    No data found.  Updated Vital Signs BP (!) 160/71 (BP Location: Left Arm)   Pulse 62   Temp 97.6 F (36.4 C) (Oral)   Resp 17   Ht 5\' 5"  (1.651 m)   Wt 198 lb 12.8 oz (90.2 kg)   SpO2 100%   BMI 33.08 kg/m   Visual Acuity Right Eye Distance:   Left Eye Distance:  Bilateral Distance:    Right Eye Near:   Left Eye Near:    Bilateral Near:     Physical Exam Vitals reviewed.  Constitutional:      General: She is not in acute distress.    Appearance: She is well-developed.  HENT:     Head: Normocephalic and atraumatic.  Cardiovascular:     Rate and Rhythm: Normal rate and regular rhythm.     Heart sounds: Normal heart sounds.  Pulmonary:     Effort: Pulmonary effort is normal.     Breath sounds: Normal breath sounds.  Abdominal:     General: Bowel sounds are normal. There is no distension.     Palpations: Abdomen is soft.     Tenderness:  There is no abdominal tenderness.  Musculoskeletal:     Lumbar back: No swelling, deformity or tenderness. Decreased range of motion.     Left hip: No deformity or tenderness. Decreased range of motion.  Skin:    General: Skin is warm and dry.  Neurological:     General: No focal deficit present.     Mental Status: She is alert and oriented to person, place, and time.  Psychiatric:        Mood and Affect: Mood normal.        Behavior: Behavior normal.     UC Treatments / Results  Labs (all labs ordered are listed, but only abnormal results are displayed) Labs Reviewed  POCT URINALYSIS DIPSTICK, ED / UC    EKG   Radiology DG Abd 2 Views  Result Date: 06/15/2021 CLINICAL DATA:  Pelvic pain and left flank pain EXAM: ABDOMEN - 2 VIEW COMPARISON:  Radiograph dated October 05, 2014 FINDINGS: Nonobstructive bowel gas pattern. Calcifications seen in the pelvis, likely calcified fibroids. Dextrocurvature of the thoracic spine. Multilevel moderate to severe degenerative disc disease. IMPRESSION: Nonobstructive bowel gas pattern. Electronically Signed   By: Allegra Lai M.D.   On: 06/15/2021 16:38    Procedures Procedures (including critical care time)  Medications Ordered in UC Medications - No data to display  Initial Impression / Assessment and Plan / UC Course  I have reviewed the triage vital signs and the nursing notes.  Pertinent labs & imaging results that were available during my care of the patient were reviewed by me and considered in my medical decision making (see chart for details).  The patient is a 71 year old female who presents with continued left-sided truncal pain.  She presented to the clinic in January with the same or similar symptoms.  Today she returns stating nothing has changed.  Patient continues to have pain that is not reproducible with palpation, nor has she suffered any injury or trauma.  Patient with a history of osteoarthritis.  Patient was informed at  her previous clinic visit, that her symptoms were related to her osteoarthritis in the hip.  X-ray was performed at the patient's request, which showed no fracture or dislocation, which is seen on x-ray only.  Patient was informed that we did not have an ultrasound machine, CT machine or MRI imaging in this clinic if that is what she was seeking.  Patient informed her urinalysis was normal.  Ridging results with the patient, and told her that most of her symptoms are mostly degenerative, and that she would need to follow-up with her PCP for further evaluation and/or possible referral.  We will provide the patient with 15 tramadol, and informed her that she cannot continue to have this medication  dispensed through our clinic routinely.  Patient encouraged to follow-up with her PCP. Final Clinical Impressions(s) / UC Diagnoses   Final diagnoses:  None   Discharge Instructions   None    ED Prescriptions   None    PDMP not reviewed this encounter.   Abran Cantor, NP 06/15/21 1736

## 2021-06-15 NOTE — Telephone Encounter (Signed)
Patient is calling for her lab results.  She would like a call back, as she can't accesses her My Chart.  ?

## 2021-06-15 NOTE — Discharge Instructions (Addendum)
Take medication as prescribed. ?As discussed, you will need to follow-up with your primary care physician or orthopedics for your continued or worsening osteoarthritis. ?Continue performing your stretching exercises that you perform at home to help with mobility. ?Continue to take Tylenol arthritis strength for worsening pain. ?Follow-up with your primary care. ?

## 2021-06-15 NOTE — Telephone Encounter (Signed)
Returned a call to the patient and explained to her that if she is referring to the inspire device, it is not a machine. It will involve a surgery to implant into her body by a ENT. She would also have to meet certain qualifications. Patient states she does not want to do this. She appeared not know this requires a surgical implantation of the device. ?

## 2021-06-16 DIAGNOSIS — G4733 Obstructive sleep apnea (adult) (pediatric): Secondary | ICD-10-CM | POA: Diagnosis not present

## 2021-06-16 NOTE — Telephone Encounter (Signed)
Recommend adding Zetia 10 mg daily ?

## 2021-06-21 ENCOUNTER — Telehealth: Payer: Self-pay | Admitting: Cardiology

## 2021-06-21 NOTE — Telephone Encounter (Signed)
[employer] called and wanted to let us know that the patient wanted another device due to her being claustrophobic. Please call back ? ?Returned a call to the patient and explained to her that if she is referring to the inspire device, it is not a machine. It will involve a surgery to implant into her body by a ENT. She would also have to meet certain qualifications. Patient states she does not want to do this. She appeared not know this requires a surgical implantation of the device. ?

## 2021-06-22 MED ORDER — EZETIMIBE 10 MG PO TABS: 10.0000 mg | ORAL_TABLET | Freq: Every day | ORAL | 3 refills | Status: DC

## 2021-06-22 NOTE — Addendum Note (Signed)
Addended by: Patria Mane A on: 06/22/2021 05:20 PM ? ? Modules accepted: Orders ? ?

## 2021-06-22 NOTE — Telephone Encounter (Signed)
Patient aware and verbalized understanding.  Rx sent to pharmacy. ?

## 2021-06-23 ENCOUNTER — Ambulatory Visit (INDEPENDENT_AMBULATORY_CARE_PROVIDER_SITE_OTHER): Payer: Medicare Other | Admitting: Pharmacist

## 2021-06-23 ENCOUNTER — Encounter: Payer: Self-pay | Admitting: Pharmacist

## 2021-06-23 VITALS — BP 142/76 | HR 80 | Resp 15 | Ht 65.0 in | Wt 196.8 lb

## 2021-06-23 DIAGNOSIS — I10 Essential (primary) hypertension: Secondary | ICD-10-CM

## 2021-06-23 NOTE — Patient Instructions (Addendum)
It was good seeing you again ? ?We would like your blood pressure to be less than 130/80 ? ?Please continue your: ? ?Hydrochlorothiazide '25mg'$  daily ?Amlodipine '10mg'$  daily ?Losartan '100mg'$  daily ?Isosorbide '15mg'$  daily ?Carvedilol '25mg'$  twice a day ? ?Try to watch how much salt you are eating.  Cut down on sandwiches, hamburgers, and canned soup.   ? ?Try to increase your fruits and vegetables.  Switch your seasoning to Mrs Deliah Boston ? ?Karren Cobble, PharmD, BCACP, [place], CPP ?[address], [address] ?[place], [place], [address] ?Phone: [phone], Fax: [phone]  ?

## 2021-06-23 NOTE — Progress Notes (Signed)
Patient ID: [name]                 DOB: 06/26/1950                      MRN: [id number] ? ? ? ? ?HPI: ?[name] is a 71 y.o. female referred by Dr. Gardiner Rhyme to HTN clinic. PMH is significant for HTN, CAD, elevated coronary calcium score, renal infarct, HLD, OSA, and history of smoking. Patient seen by Dr Gardiner Rhyme on 03/29/21 and started on amlodipine '5mg'$  daily which was later increased to '10mg'$  daily. At last visit with HTN clinic HCTZ was restarted. Follow up lab work normal.  ? ?Patient presents today in good spirits. Reporrts she feels like she has gained weight. Looked back at previous vitals and weight has been fairly consistent.  Discussed diet in detail with patient.  Patient eats a lot of processed and prepared foods with salt.  Frequently eats fast foods, frozen foods, and canned foods like soups. Did not know of the salt content. ? ?Reports no adverse effects since restarting HCTZ. Reports medication compliance at this visit. Reports she is checkign BP at home but forgot to bring in log again and can not remember any home readings ? ?Current HTN meds:  ?HCTZ '25mg'$  daily ?Amlodipine '10mg'$  daily ?Losartan '100mg'$  daily ?Furosemide '40mg'$  daily PRN ?Imdur '15mg'$  daily ?Carvedilol '25mg'$  BID ? ?BP goal: <130/80 ? ? ?Wt Readings from Last 3 Encounters:  ?06/15/21 198 lb 12.8 oz (90.2 kg)  ?05/26/21 198 lb 12.8 oz (90.2 kg)  ?05/04/21 196 lb 6 oz (89.1 kg)  ? ?BP Readings from Last 3 Encounters:  ?06/15/21 (!) 160/71  ?05/26/21 (!) 154/74  ?05/04/21 (!) 145/71  ? ?Pulse Readings from Last 3 Encounters:  ?06/15/21 62  ?05/26/21 64  ?05/04/21 75  ? ? ?Renal function: ?Estimated Creatinine Clearance: 53 mL/min (A) (by C-G formula based on SCr of 1.08 mg/dL (H)). ? ?Past Medical History:  ?Diagnosis Date  ? Abnormal EKG 2012  ? hospitalized for T wave inversion in lateral leads with MSK chest pains, normal ECHO, negative trops, no cardiology consult. recent EKG 7/15 stable T wave inversions.   ? Aortic valve  vegetation 10/04/2015  ? Arthritis 2000  ? Arthritis 2011  ? Clotting disorder ([place])   ? blood clot kidney 2017  ? Heart murmur   ? Hyperlipidemia 2011  ? Hypertension 2011  ? Meniscus tear 2000  ? L KNEE  ? 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 blood clot in the kidney  ? ? ?Current Outpatient Medications on File Prior to Visit  ?Medication Sig Dispense Refill  ? amLODipine (NORVASC) 10 MG tablet Take 1 tablet (10 mg total) by mouth daily. 90 tablet 1  ? aspirin EC 81 MG tablet Take 1 tablet (81 mg total) daily by mouth. 30 tablet 11  ? atorvastatin (LIPITOR) 80 MG tablet Take 1 tablet (80 mg total) by mouth daily at 6 PM. (Patient taking differently: Take 40 mg by mouth daily at 6 PM.) 90 tablet 3  ? carvedilol (COREG) 25 MG tablet Take 1 tablet (25 mg total) by mouth 2 (two) times daily with a meal. 180 tablet 3  ? estradiol (ESTRACE VAGINAL) 0.1 MG/GM vaginal cream Place 1 Applicatorful vaginally at bedtime. 42.5 g 2  ? ezetimibe (ZETIA) 10 MG tablet Take 1 tablet (10 mg total) by mouth daily. 90 tablet 3  ? fluticasone (FLONASE) 50 MCG/ACT nasal spray Place 2 sprays into both  nostrils 2 (two) times daily. 18.2 mL 3  ? furosemide (LASIX) 40 MG tablet Take 1 tablet (40 mg total) by mouth daily. 30 tablet 5  ? gabapentin (NEURONTIN) 300 MG capsule Take 300 mg by mouth 2 (two) times daily.    ? hydrochlorothiazide (HYDRODIURIL) 25 MG tablet Take 1 tablet (25 mg total) by mouth daily. 90 tablet 1  ? isosorbide mononitrate (IMDUR) 30 MG 24 hr tablet Take 0.5 tablets (15 mg total) by mouth daily. 30 tablet 5  ? losartan (COZAAR) 100 MG tablet Take 1 tablet (100 mg total) by mouth daily. 90 tablet 3  ? Multiple Vitamin (MULITIVITAMIN WITH MINERALS) TABS Take 1 tablet by mouth daily.    ? nitroGLYCERIN (NITROSTAT) 0.4 MG SL tablet Place 1 tablet (0.4 mg total) under the tongue every 5 (five) minutes as needed. 25 tablet 3  ? potassium chloride SA (KLOR-CON) 20 MEQ tablet TAKE 1 TABLET(20 MEQ) BY MOUTH DAILY 90 tablet 3  ?  promethazine-dextromethorphan (PROMETHAZINE-DM) 6.25-15 MG/5ML syrup Take 5 mLs by mouth 2 (two) times daily as needed for cough. 100 mL 0  ? traMADol (ULTRAM) 50 MG tablet Take 1 tablet (50 mg total) by mouth 2 (two) times daily as needed for moderate pain. 10 tablet 0  ? triamcinolone cream (KENALOG) 0.1 % Apply 1 application topically daily as needed (Itching).    ? ?No current facility-administered medications on file prior to visit.  ? ? ?Allergies  ?Allergen Reactions  ? Shrimp [Shellfish Allergy] Hives and Itching  ? Latex Itching  ? Penicillins Itching and Rash  ?  Has patient had a PCN reaction causing immediate rash, facial/tongue/throat swelling, SOB or lightheadedness with hypotension: unknown ?Has patient had a PCN reaction causing severe rash involving mucus membranes or skin necrosis: unknown ?Has patient had a PCN reaction that required hospitalization unknown ?Has patient had a PCN reaction occurring within the last 10 years: No ?If all of the above answers are "NO", then may proceed with Cephalosporin use.  ? ? ? ?Assessment/Plan: ? ?1. Hypertension -  Patient's BP in room 142/76 which is improved from last visit however still above goal of <130/80. Patient likely being more compliant with medications than previous.   ? ?After discussion, its also likely patient's diet is contributing to her hypertension.  Eats many high sodium foods such as canned soups and fast food hamburgers.  Recommended patient increase fruits, vegetables, whole grains, and lean proteins. Recommend she begin purchasing low sodium foods and begin cooking with Mrs Deliah Boston. Gave salty six handout. Will continue current medications and recheck in 1 month after patient has begun instituting diet changes. ? ?Continue: ?HCTZ '25mg'$  daily ?Amlodipine '10mg'$  daily ?Losartan '100mg'$  daily ?Furosemide '40mg'$  daily PRN ?Imdur '15mg'$  daily ?Carvedilol '25mg'$  BID ?Recheck in 4 weeks ? ?Karren Cobble, PharmD, BCACP, [place], CPP ?[address], [address] ?[place], [place], [address] ?Phone: [phone], Fax: [phone]  ?

## 2021-07-01 ENCOUNTER — Telehealth: Payer: Self-pay | Admitting: Cardiology

## 2021-07-01 NOTE — Telephone Encounter (Signed)
Patient called stating she would like to have the Inspire implant, she said she called her insurance company and they told her she would be covered for it.  ?

## 2021-07-06 ENCOUNTER — Telehealth: Payer: Self-pay | Admitting: *Deleted

## 2021-07-06 NOTE — Telephone Encounter (Signed)
Patient called in to say that she cannot get her machine set up correctly. "I can't get the hoses and water done right." She states that she was shown when she picked the machine up but she just can't do it. I told the patient that I will contact Anderson Malta at [facility] to see if she can bring it back so they can but the machine together for her so that all she has to do is plug it in.Marland Kitchen ?

## 2021-07-17 DIAGNOSIS — G4733 Obstructive sleep apnea (adult) (pediatric): Secondary | ICD-10-CM | POA: Diagnosis not present

## 2021-07-20 ENCOUNTER — Telehealth: Payer: Self-pay | Admitting: Cardiovascular Disease

## 2021-07-20 NOTE — Telephone Encounter (Signed)
Patient states she saw a sleep machine advertised and she is interested in having it ordered instead of the machine ordered by the doctor. She would like a call back to discuss this. ?

## 2021-07-20 NOTE — Telephone Encounter (Signed)
Patient called into inquire about potential new sleep machine. Patient states she saw it advertised on TV, it is the one with out the mask- per patient. Advised patient I would forward message to Dr. Claiborne Billings and our sleep coordinator to see if there is any recommendations or more information about this. Patient aware and verbalized understanding.  ?

## 2021-07-21 ENCOUNTER — Ambulatory Visit: Payer: Medicare Other

## 2021-07-21 NOTE — Telephone Encounter (Signed)
Returned a call to the patient and informed her that the inspire is a surgical device that is implanted by a ENT. She is more than welcomed to make a appointment with their practice for a consult to see if she qualifies, however I don't feel that she does. Patient states that she is not happy with the CPAP. I again said to the patient that she is more than welcome to do a consult with the ENT practice. ?

## 2021-07-26 ENCOUNTER — Ambulatory Visit (HOSPITAL_COMMUNITY)
Admission: EM | Admit: 2021-07-26 | Discharge: 2021-07-26 | Disposition: A | Discharge status: Home / Self Care | Payer: Medicare Other | Attending: Nurse Practitioner | Admitting: Nurse Practitioner

## 2021-07-26 ENCOUNTER — Ambulatory Visit (INDEPENDENT_AMBULATORY_CARE_PROVIDER_SITE_OTHER): Payer: Medicare Other | Admitting: Pharmacist

## 2021-07-26 ENCOUNTER — Encounter (HOSPITAL_COMMUNITY): Payer: Self-pay

## 2021-07-26 VITALS — BP 144/80 | HR 82 | Resp 16 | Ht 65.0 in | Wt 197.0 lb

## 2021-07-26 DIAGNOSIS — I10 Essential (primary) hypertension: Secondary | ICD-10-CM | POA: Diagnosis not present

## 2021-07-26 DIAGNOSIS — N898 Other specified noninflammatory disorders of vagina: Secondary | ICD-10-CM | POA: Insufficient documentation

## 2021-07-26 LAB — POCT URINALYSIS DIPSTICK, ED / UC
Bilirubin Urine: NEGATIVE
Glucose, UA: NEGATIVE mg/dL
Hgb urine dipstick: NEGATIVE
Ketones, ur: NEGATIVE mg/dL
Leukocytes,Ua: NEGATIVE
Nitrite: NEGATIVE
Protein, ur: NEGATIVE mg/dL
Specific Gravity, Urine: 1.01 (ref 1.005–1.030)
Urobilinogen, UA: 0.2 mg/dL (ref 0.0–1.0)
pH: 5.5 (ref 5.0–8.0)

## 2021-07-26 NOTE — ED Provider Notes (Signed)
?[facility] ? ? ? ?CSN: [id number] ?Arrival date & time: 07/26/21  1257 ? ? ?  ? ?History   ?Chief Complaint ?Chief Complaint  ?Patient presents with  ? Vaginal Itching  ? ? ?HPI ?[name] is a 71 y.o. female.  ? ?Patient presents for 1 week of vaginal discharge.  She also reports vaginal itching and irritation.  She reports the discharge is white, thin, and itchy.  She denies any burning with urination.  She is having some lower abdominal pain that started around the same time.  She denies any fevers, nausea/vomiting. ? ?She has been using a douche as well as new vaginal soap that has not been helping.  Patient reports she has not been sexually active in more than 2 years, however is willing to have gonorrhea, chlamydia, and trichomonas testing today. ? ?Denies chest pain, shortness of breath, headache, vision changes. ? ? ?Past Medical History:  ?Diagnosis Date  ? Abnormal EKG 2012  ? hospitalized for T wave inversion in lateral leads with MSK chest pains, normal ECHO, negative trops, no cardiology consult. recent EKG 7/15 stable T wave inversions.   ? 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  ? Arthritis 2000  ? Arthritis 2011  ? Clotting disorder ([place])   ? blood clot kidney 2017  ? Heart murmur   ? Hyperlipidemia 2011  ? Hypertension 2011  ? Meniscus tear 2000  ? L KNEE  ? 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 blood clot in the kidney  ? ? ?Patient Active Problem List  ? Diagnosis Date Noted  ? Daytime somnolence 01/12/2021  ? Atypical chest pain 12/13/2020  ? Abnormal cardiac CT angiography 12/13/2020  ? Left elbow pain 03/31/2020  ? Contact dermatitis 01/14/2019  ? Vaginal itching 12/05/2018  ? Pelvic pain 10/07/2018  ? Leg edema 08/28/2018  ? Thickened endometrium   ? Umbilical hernia without obstruction and without gangrene 01/01/2018  ? Osteopenia 09/05/2017  ? Closed nondisplaced spiral fracture of shaft of right tibia 03/28/2017  ? 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  ? Renal infarct ([place]) 09/30/2015  ? HLD  (hyperlipidemia)   ? Aortic atherosclerosis ([place])   ? Back pain 07/08/2015  ? Scotoma of blind spot area in visual field 06/14/2015  ? Bilateral leg pain 06/14/2015  ? Seasonal allergies 03/09/2015  ? Hypopigmentation 03/09/2015  ? Osteoarthritis of left knee 07/08/2014  ? DJD (degenerative joint disease), lumbar 07/08/2014  ? Osteoarthritis of right knee 07/08/2014  ? Urinary frequency 06/11/2014  ? Healthcare maintenance 06/11/2014  ? Allergic rhinoconjunctivitis of both eyes 06/11/2014  ? Nearsightedness 04/27/2014  ? Abnormal EKG   ? Hypertension 12/11/2013  ? ? ?Past Surgical History:  ?Procedure Laterality Date  ? ABDOMINAL SURGERY  2010  ? for bowel blockage  ? COLON SURGERY  2010  ? bowel blockage  ? COLONOSCOPY  09/2014  ? HYSTEROSCOPY WITH D & C N/A 05/08/2018  ? Procedure: DILATATION AND CURETTAGE /HYSTEROSCOPY;  Surgeon: Mora Bellman, MD;  Location: [facility];  Service: Gynecology;  Laterality: N/A;  ? KNEE ARTHROSCOPY Left 09/25/2013  ? Procedure: LEFT KNEE ARTHROSCOPY WITH PARTIAL MEDIAL AND LATERAL  MENISCECTOMY/DEBRIDEMENT/MICRO FRACTURE MEDIAL FEMORAL CONDYLE, REMOVAL OF OSTEOCONDRAL FRAGMENT;  Surgeon: Johnn Hai, MD;  Location: [facility];  Service: Orthopedics;  Laterality: Left;  ? KNEE SURGERY Right 1999  ? LEFT HEART CATH AND CORONARY ANGIOGRAPHY N/A 12/13/2020  ? Procedure: LEFT HEART CATH AND CORONARY ANGIOGRAPHY;  Surgeon: Nelva Bush, MD;  Location: [facility];  Service: Cardiovascular;  Laterality: N/A;  ? POLYPECTOMY    ? TEE WITHOUT CARDIOVERSION N/A 10/04/2015  ? Procedure: TRANSESOPHAGEAL ECHOCARDIOGRAM (TEE);  Surgeon: Sueanne Margarita, MD;  Location: [facility];  Service: Cardiovascular;  Laterality: N/A;  ? [place]  ? ? ?OB History   ? ? Gravida  ?2  ? Para  ?2  ? Term  ?   ? Preterm  ?   ? AB  ?   ? Living  ?2  ?  ? ? SAB  ?   ? IAB  ?   ? Ectopic  ?   ? Multiple  ?   ? Live Births  ?   ?   ?  ?  ? ? ? ?Home Medications   ? ?Prior to  Admission medications   ?Medication Sig Start Date End Date Taking? Authorizing Provider  ?amLODipine (NORVASC) 10 MG tablet Take 1 tablet (10 mg total) by mouth daily. 04/28/21   Donato Heinz, MD  ?aspirin EC 81 MG tablet Take 1 tablet (81 mg total) daily by mouth. 01/31/17   Clent Demark, PA-C  ?atorvastatin (LIPITOR) 80 MG tablet Take 1 tablet (80 mg total) by mouth daily at 6 PM. ?Patient taking differently: Take 40 mg by mouth daily at 6 PM. 12/02/20   Donato Heinz, MD  ?carvedilol (COREG) 25 MG tablet Take 1 tablet (25 mg total) by mouth 2 (two) times daily with a meal. 12/07/20 12/02/21  Donato Heinz, MD  ?ezetimibe (ZETIA) 10 MG tablet Take 1 tablet (10 mg total) by mouth daily. 06/22/21 06/17/22  Donato Heinz, MD  ?fluticasone (FLONASE) 50 MCG/ACT nasal spray Place 2 sprays into both nostrils 2 (two) times daily. 02/03/21   Gifford Shave, MD  ?furosemide (LASIX) 40 MG tablet Take 1 tablet (40 mg total) by mouth daily. 12/13/20 12/13/21  End, Harrell Gave, MD  ?gabapentin (NEURONTIN) 300 MG capsule Take 300 mg by mouth 2 (two) times daily. 11/14/19   [provider]  ?hydrochlorothiazide (HYDRODIURIL) 25 MG tablet Take 1 tablet (25 mg total) by mouth daily. 05/26/21 08/24/21  Donato Heinz, MD  ?isosorbide mononitrate (IMDUR) 30 MG 24 hr tablet Take 0.5 tablets (15 mg total) by mouth daily. 12/13/20 12/13/21  End, Harrell Gave, MD  ?losartan (COZAAR) 100 MG tablet Take 1 tablet (100 mg total) by mouth daily. 12/29/20   Donato Heinz, MD  ?Multiple Vitamin (MULITIVITAMIN WITH MINERALS) TABS Take 1 tablet by mouth daily.    [provider]  ?nitroGLYCERIN (NITROSTAT) 0.4 MG SL tablet Place 1 tablet (0.4 mg total) under the tongue every 5 (five) minutes as needed. 04/28/21 07/27/21  Donato Heinz, MD  ?potassium chloride SA (KLOR-CON) 20 MEQ tablet TAKE 1 TABLET(20 MEQ) BY MOUTH DAILY 07/16/20   Gifford Shave, MD  ? ? ?Family  History ?Family History  ?Problem Relation Age of Onset  ? Hypertension Mother   ? Heart disease Mother   ?     has a pacemaker   ? Alzheimer's disease Mother   ? Hyperlipidemia Mother   ? Osteoporosis Mother   ? Heart failure Father   ? Hypertension Father   ? Alzheimer's disease Father   ? Hypertension Sister   ? Breast cancer Sister   ? Hypertension Sister   ? Gout Sister   ? Alcohol abuse Son   ? Pancreatitis Son   ? Cancer Neg Hx   ? Colon cancer Neg Hx   ? Esophageal  cancer Neg Hx   ? Stomach cancer Neg Hx   ? Rectal cancer Neg Hx   ? ? ?Social History ?Social History  ? ?Tobacco Use  ? Smoking status: Former  ?  Types: Cigarettes  ?  Quit date: 07/29/2010  ?  Years since quitting: 11.0  ? Smokeless tobacco: Never  ?Vaping Use  ? Vaping Use: Never used  ?Substance Use Topics  ? Alcohol use: Never  ?  Alcohol/week: 0.0 standard drinks  ? Drug use: Never  ? ? ? ?Allergies   ?Shrimp [shellfish allergy], Latex, and Penicillins ? ? ?Review of Systems ?Review of Systems ?Per HPI ? ?Physical Exam ?Triage Vital Signs ?ED Triage Vitals  ?Enc Vitals Group  ?   BP 07/26/21 1352 (!) 184/86  ?   Pulse Rate 07/26/21 1352 80  ?   Resp 07/26/21 1352 18  ?   Temp 07/26/21 1352 98 ?F (36.7 ?C)  ?   Temp Source 07/26/21 1352 Oral  ?   SpO2 07/26/21 1352 98 %  ?   Weight --   ?   Height --   ?   Head Circumference --   ?   Peak Flow --   ?   Pain Score 07/26/21 1353 8  ?   Pain Loc --   ?   Pain Edu? --   ?   Excl. in [place]? --   ? ?No data found. ? ?Updated Vital Signs ?BP (!) 184/86 (BP Location: Right Arm)   Pulse 80   Temp 98 ?F (36.7 ?C) (Oral)   Resp 18   SpO2 98%  ? ?Visual Acuity ?Right Eye Distance:   ?Left Eye Distance:   ?Bilateral Distance:   ? ?Right Eye Near:   ?Left Eye Near:    ?Bilateral Near:    ? ?Physical Exam ?Vitals and nursing note reviewed. Exam conducted with a chaperone present Maudie Mercury, RN).  ?Constitutional:   ?   General: She is not in acute distress. ?   Appearance: Normal appearance. She is not  toxic-appearing.  ?Pulmonary:  ?   Effort: Pulmonary effort is normal. No respiratory distress.  ?Abdominal:  ?   General: Abdomen is flat. Bowel sounds are normal. There is no distension.  ?   Palpations: Abdomen is soft.  ?

## 2021-07-26 NOTE — Progress Notes (Signed)
Patient ID: [name]                 DOB: 15-Aug-1950                      MRN: [id number] ? ? ? ? ?HPI: ?[name] is a 71 y.o. female referred by Dr. Gardiner Rhyme to HTN clinic. PMH is significant for HTN, CAD, elevated coronary calcium score, renal infarct, HLD, OSA, and history of smoking. Patient seen by Dr Gardiner Rhyme on 03/29/21 and started on amlodipine '5mg'$  daily which was later increased to '10mg'$  daily. At last visit with HTN clinic HCTZ was restarted. Follow up lab work normal.  ? ?At last visit it was revealed patient was eating many salty foods without realizing it such as fast food, canned soups, and frozen entrees.  Was advised to reduce foods high in sodium content. ? ?Patient presents today in good spirits.  Forgot BP log but believes home readings have been in 140s/80s. Reports she has reduced the amount of salty foods she is eating and has switched to using Mrs Deliah Boston. ? ?Has been eating vegetables, fruits, salads, oatmeal, and cheerios. ? ?She is concerned that her CPAP machine is not working throughout the night although she reports she is sleeping well.  Has appointment with Dr Claiborne Billings next week for sleep assessment. ? ?Patient reports she is tolerating all medications well.  Has had compliance issues in the past. ? ? ?Current HTN meds:  ?HCTZ '25mg'$  daily ?Amlodipine '10mg'$  daily ?Losartan '100mg'$  daily ?Furosemide '40mg'$  daily PRN ?Imdur '15mg'$  daily ?Carvedilol '25mg'$  BID ? ? ?Wt Readings from Last 3 Encounters:  ?06/23/21 196 lb 12.8 oz (89.3 kg)  ?06/15/21 198 lb 12.8 oz (90.2 kg)  ?05/26/21 198 lb 12.8 oz (90.2 kg)  ? ?BP Readings from Last 3 Encounters:  ?06/23/21 (!) 142/76  ?06/15/21 (!) 160/71  ?05/26/21 (!) 154/74  ? ?Pulse Readings from Last 3 Encounters:  ?06/23/21 80  ?06/15/21 62  ?05/26/21 64  ? ? ?Renal function: ?CrCl cannot be calculated (Patient's most recent lab result is older than the maximum 21 days allowed.). ? ?Past Medical History:  ?Diagnosis Date  ? Abnormal EKG 2012  ? hospitalized  for T wave inversion in lateral leads with MSK chest pains, normal ECHO, negative trops, no cardiology consult. recent EKG 7/15 stable T wave inversions.   ? 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  ? Arthritis 2000  ? Arthritis 2011  ? Clotting disorder ([place])   ? blood clot kidney 2017  ? Heart murmur   ? Hyperlipidemia 2011  ? Hypertension 2011  ? Meniscus tear 2000  ? L KNEE  ? 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 blood clot in the kidney  ? ? ?Current Outpatient Medications on File Prior to Visit  ?Medication Sig Dispense Refill  ? amLODipine (NORVASC) 10 MG tablet Take 1 tablet (10 mg total) by mouth daily. 90 tablet 1  ? aspirin EC 81 MG tablet Take 1 tablet (81 mg total) daily by mouth. 30 tablet 11  ? atorvastatin (LIPITOR) 80 MG tablet Take 1 tablet (80 mg total) by mouth daily at 6 PM. (Patient taking differently: Take 40 mg by mouth daily at 6 PM.) 90 tablet 3  ? carvedilol (COREG) 25 MG tablet Take 1 tablet (25 mg total) by mouth 2 (two) times daily with a meal. 180 tablet 3  ? ezetimibe (ZETIA) 10 MG tablet Take 1 tablet (10 mg total) by mouth daily. 90 tablet 3  ?  fluticasone (FLONASE) 50 MCG/ACT nasal spray Place 2 sprays into both nostrils 2 (two) times daily. 18.2 mL 3  ? furosemide (LASIX) 40 MG tablet Take 1 tablet (40 mg total) by mouth daily. 30 tablet 5  ? gabapentin (NEURONTIN) 300 MG capsule Take 300 mg by mouth 2 (two) times daily.    ? hydrochlorothiazide (HYDRODIURIL) 25 MG tablet Take 1 tablet (25 mg total) by mouth daily. 90 tablet 1  ? isosorbide mononitrate (IMDUR) 30 MG 24 hr tablet Take 0.5 tablets (15 mg total) by mouth daily. 30 tablet 5  ? losartan (COZAAR) 100 MG tablet Take 1 tablet (100 mg total) by mouth daily. 90 tablet 3  ? Multiple Vitamin (MULITIVITAMIN WITH MINERALS) TABS Take 1 tablet by mouth daily.    ? nitroGLYCERIN (NITROSTAT) 0.4 MG SL tablet Place 1 tablet (0.4 mg total) under the tongue every 5 (five) minutes as needed. 25 tablet 3  ? potassium chloride SA (KLOR-CON) 20 MEQ  tablet TAKE 1 TABLET(20 MEQ) BY MOUTH DAILY 90 tablet 3  ? ?No current facility-administered medications on file prior to visit.  ? ? ?Allergies  ?Allergen Reactions  ? Shrimp [Shellfish Allergy] Hives and Itching  ? Latex Itching  ? Penicillins Itching and Rash  ?  Has patient had a PCN reaction causing immediate rash, facial/tongue/throat swelling, SOB or lightheadedness with hypotension: unknown ?Has patient had a PCN reaction causing severe rash involving mucus membranes or skin necrosis: unknown ?Has patient had a PCN reaction that required hospitalization unknown ?Has patient had a PCN reaction occurring within the last 10 years: No ?If all of the above answers are "NO", then may proceed with Cephalosporin use.  ? ? ? ?Assessment/Plan: ? ?1. Hypertension -  Patient BP in room today 144/80 which is similar to last reading.  Compliance remains questionable however patient feels well. Has begun to reduce salt in her diet.  Encouraged her to keep appointment with Dr Claiborne Billings next week.  Suggested switching HCTZ to chlorthalidone for more potent BP lowering however patient reports she has a large amount of HCTZ currently at home.  Will not make any changes at this time. ? ?Continue: ?HCTZ '25mg'$  daily ?Amlodipine '10mg'$  daily ?Losartan '100mg'$  daily ?Furosemide '40mg'$  daily PRN ?Imdur '15mg'$  daily ?Carvedilol '25mg'$  BID ?Follow up in 1 week with Dr Claiborne Billings ? ?Karren Cobble, PharmD, BCACP, [place], CPP ?[address], [address] ?[place], [place], [address] ?Phone: [phone], Fax: [phone]  ?

## 2021-07-26 NOTE — Discharge Instructions (Signed)
-   The urine sample today is normal; no signs of infection ?- Please stop using a douche and using any soaps in or around your vagina ?- We will call you with results from the vaginal swab tomorrow  ?

## 2021-07-26 NOTE — ED Triage Notes (Signed)
Pt states vaginal pain, itching and odor for the past week. Using OTC products with no relief. ?

## 2021-07-26 NOTE — Patient Instructions (Addendum)
It was nice seeing you again ? ?Please continue your  ?Hydrochlorothiazide '25mg'$  daily ?Amlodipine '10mg'$  daily ?Losartan '100mg'$  daily ?Furosemide '40mg'$  daily as needed in the morning ?Isosorbide '15mg'$  daily ?Carvedilol '25mg'$  twice a day ? ?Continue your healthy eating habits ? ?Remember you have an appointment with Dr Claiborne Billings next week ? ?Continue eating your fruit, salads, oatmeal, cheerios.  Continue cooking with Mrs Deliah Boston ? ?Karren Cobble, PharmD, BCACP, [place], CPP ?[address], [address] ?[place], [place], [address] ?Phone: [phone], Fax: [phone]  ?

## 2021-07-27 ENCOUNTER — Telehealth (HOSPITAL_COMMUNITY): Payer: Self-pay | Admitting: Emergency Medicine

## 2021-07-27 LAB — CERVICOVAGINAL ANCILLARY ONLY
Bacterial Vaginitis (gardnerella): NEGATIVE
Candida Glabrata: POSITIVE — AB
Candida Vaginitis: NEGATIVE
Chlamydia: NEGATIVE
Comment: NEGATIVE
Comment: NEGATIVE
Comment: NEGATIVE
Comment: NEGATIVE
Comment: NEGATIVE
Comment: NORMAL
Neisseria Gonorrhea: NEGATIVE
Trichomonas: NEGATIVE

## 2021-07-27 MED ORDER — FLUCONAZOLE 150 MG PO TABS: 150.0000 mg | ORAL_TABLET | Freq: Once | ORAL | 0 refills | Status: AC

## 2021-07-27 NOTE — Telephone Encounter (Signed)
Patient came to urgent care asking about results .  Verified identity using 2 patient identifiers.  Reviewed results of vaginal swab with patient and messages that was sent to her by Glori Bickers, RN.  Patient states understanding of results and medication and where to pick up prescription.   ?

## 2021-08-02 ENCOUNTER — Ambulatory Visit (INDEPENDENT_AMBULATORY_CARE_PROVIDER_SITE_OTHER): Payer: Medicare Other | Admitting: Cardiovascular Disease

## 2021-08-02 ENCOUNTER — Encounter: Payer: Self-pay | Admitting: Cardiovascular Disease

## 2021-08-02 DIAGNOSIS — Z9989 Dependence on other enabling machines and devices: Secondary | ICD-10-CM

## 2021-08-02 DIAGNOSIS — R400 Somnolence: Secondary | ICD-10-CM | POA: Diagnosis not present

## 2021-08-02 DIAGNOSIS — E785 Hyperlipidemia, unspecified: Secondary | ICD-10-CM | POA: Diagnosis not present

## 2021-08-02 DIAGNOSIS — I25118 Atherosclerotic heart disease of native coronary artery with other forms of angina pectoris: Secondary | ICD-10-CM

## 2021-08-02 DIAGNOSIS — I10 Essential (primary) hypertension: Secondary | ICD-10-CM

## 2021-08-02 DIAGNOSIS — R0683 Snoring: Secondary | ICD-10-CM

## 2021-08-02 DIAGNOSIS — G4733 Obstructive sleep apnea (adult) (pediatric): Secondary | ICD-10-CM

## 2021-08-02 DIAGNOSIS — R011 Cardiac murmur, unspecified: Secondary | ICD-10-CM

## 2021-08-02 NOTE — Patient Instructions (Addendum)
Medication Instructions:  ? ?No changes ? ?*If you need a refill on your cardiac medications before your next appointment, please call your pharmacy* ? ? ?Lab Work: ? ?Not needed ? ? ?Testing/Procedures: ? ?Not needed ? ?Follow-Up: ?At [facility], you and your health needs are our priority.  As part of our continuing mission to provide you with exceptional heart care, we have created designated Provider Care Teams.  These Care Teams include your primary Cardiologist (physician) and Advanced Practice Providers (APPs -  Physician Assistants and Nurse Practitioners) who all work together to provide you with the care you need, when you need it. ? ?  ? ?Your next appointment:   ?6  to 8 week(s) sleep compliance ? ?The format for your next appointment:   ?In Person ? ?Provider:   ?Dr Claiborne Billings  ? ? ?Other Instructions  ? ?Use your C-PAP machine everynight ?

## 2021-08-02 NOTE — Progress Notes (Signed)
Cardiology Office Note    Date:  08/11/2021   ID:  [name], DOB 1951/01/20, MRN [id number]  PCP:  Gifford Shave, MD  Cardiologist:  Shelva Majestic, MD (sleep); Dr. Oswaldo Milian  New sleep evaluation   History of Present Illness:  [name] is a 71 y.o. female who is followed by Dr. Gardiner Rhyme for cardiology clearance.  She has a history of hypertension, hyperlipidemia, CAD with coronary calcium score of 936 and subtotal occlusion of her distal RCA with left-to-right collaterals and mild proximal LAD disease.  Due to concerns for obstructive sleep apnea with symptoms of snoring, daytime sleepiness, nonrestorative sleep in addition to hypertension she is referred for an in lab sleep study which was done on January 12, 2021.  She met split-night criteria and was found to have moderate overall sleep apnea with an AHI of 22.0 although RDI was significantly more increased at 38.9/h and she had severe sleep apnea during REM sleep with an AHI of 70.6/h.  There was oxygen desaturation during the diagnostic portion of the study to a nadir of 84% and she snored with moderate snoring volume.  CPAP was instituted was titrated to 15 cm of water where AHI was 0 but REM sleep did not occur at 15 cm.  Due to supply chain delay issues, she did not receive her new ResMed air sense 11 AutoSet unit until May 19, 2021.  Currently, she needed someone to help her set it up and for this reason delayed initiation of therapy until July 08, 2020.  A download was obtained from April 9 through Aug 01, 2021 and EPAP was not initiated until April 14.  As result this 30-day window shows reduced compliance at 60% and she only had minimum usage at 2 hours and 12 minutes when used.  Her CPAP is set at a pressure range of 15 to 20 cm.  AHI is elevated at 14.0.  Her 95th percentile pressure is 15.3.  Presently, she goes to bed at 10 and wakes up at 6.  An Epworth Sleepiness Scale score was calculated and this  endorsed 9.  She is on a regimen of amlodipine 10 mg, carvedilol 25 mg twice a day, furosemide 40 mg, HCTZ, losartan 100 mg daily and isosorbide 15 mg for hypertension and CAD.  She is on Zetia and atorvastatin 80 mg for hyperlipidemia.  She presents for initial sleep evaluation.   Past Medical History:  Diagnosis Date   Abnormal EKG 2012   hospitalized for T wave inversion in lateral leads with MSK chest pains, normal ECHO, negative trops, no cardiology consult. recent EKG 7/15 stable T wave inversions.    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   blood clot in the kidney    Past Surgical History:  Procedure Laterality Date   ABDOMINAL SURGERY  2010   for bowel blockage   COLON SURGERY  2010   bowel blockage   COLONOSCOPY  09/2014   HYSTEROSCOPY WITH D & C N/A 05/08/2018   Procedure: DILATATION AND CURETTAGE /HYSTEROSCOPY;  Surgeon: Mora Bellman, MD;  Location: [facility];  Service: Gynecology;  Laterality: N/A;   KNEE ARTHROSCOPY Left 09/25/2013   Procedure: LEFT KNEE ARTHROSCOPY WITH PARTIAL MEDIAL AND  LATERAL  MENISCECTOMY/DEBRIDEMENT/MICRO FRACTURE MEDIAL FEMORAL CONDYLE, REMOVAL OF OSTEOCONDRAL FRAGMENT;  Surgeon: Johnn Hai, MD;  Location: [facility];  Service: Orthopedics;  Laterality: Left;   KNEE SURGERY Right 1999   LEFT HEART CATH AND CORONARY ANGIOGRAPHY N/A 12/13/2020   Procedure: LEFT HEART CATH AND CORONARY ANGIOGRAPHY;  Surgeon: Nelva Bush, MD;  Location: [facility];  Service: Cardiovascular;  Laterality: N/A;   POLYPECTOMY     TEE WITHOUT CARDIOVERSION N/A 10/04/2015   Procedure: TRANSESOPHAGEAL ECHOCARDIOGRAM (TEE);  Surgeon: Sueanne Margarita, MD;  Location: [facility];  Service: Cardiovascular;  Laterality: N/A;   TUBAL LIGATION  1978     Current Medications: Outpatient Medications Prior to Visit  Medication Sig Dispense Refill   amLODipine (NORVASC) 10 MG tablet Take 1 tablet (10 mg total) by mouth daily. 90 tablet 1   aspirin EC 81 MG tablet Take 1 tablet (81 mg total) daily by mouth. 30 tablet 11   atorvastatin (LIPITOR) 80 MG tablet Take 1 tablet (80 mg total) by mouth daily at 6 PM. (Patient taking differently: Take 40 mg by mouth daily at 6 PM.) 90 tablet 3   carvedilol (COREG) 25 MG tablet Take 1 tablet (25 mg total) by mouth 2 (two) times daily with a meal. 180 tablet 3   ezetimibe (ZETIA) 10 MG tablet Take 1 tablet (10 mg total) by mouth daily. 90 tablet 3   fluconazole (DIFLUCAN) 150 MG tablet Take 150 mg by mouth once.     fluticasone (FLONASE) 50 MCG/ACT nasal spray Place 2 sprays into both nostrils 2 (two) times daily. 18.2 mL 3   furosemide (LASIX) 40 MG tablet Take 1 tablet (40 mg total) by mouth daily. 30 tablet 5   gabapentin (NEURONTIN) 300 MG capsule Take 300 mg by mouth 2 (two) times daily.     hydrochlorothiazide (HYDRODIURIL) 25 MG tablet Take 1 tablet (25 mg total) by mouth daily. 90 tablet 1   isosorbide mononitrate (IMDUR) 30 MG 24 hr tablet Take 0.5 tablets (15 mg total) by mouth daily. 30 tablet 5   losartan (COZAAR) 100 MG tablet Take 1 tablet (100 mg total) by mouth daily. 90 tablet 3   Multiple Vitamin (MULITIVITAMIN WITH MINERALS) TABS Take 1 tablet by mouth daily.     nitroGLYCERIN (NITROSTAT) 0.4 MG SL tablet Place 1 tablet (0.4 mg total) under the tongue every 5 (five) minutes as needed. 25 tablet 3   potassium chloride SA (KLOR-CON) 20 MEQ tablet TAKE 1 TABLET(20 MEQ) BY MOUTH DAILY 90 tablet 3   No facility-administered medications prior to visit.     Allergies:   Shrimp [shellfish allergy], Latex, and Penicillins   Social History   Socioeconomic History   Marital status: Single    Spouse name: Not on file   Number of children: 2   Years of education: some colle   Highest  education level: Not on file  Occupational History   Occupation: Custodian     Employer: [employer]  Tobacco Use   Smoking status: Former    Types: Cigarettes    Quit date: 07/29/2010    Years since quitting: 11.0   Smokeless tobacco: Never  Vaping Use   Vaping Use: Never used  Substance and Sexual Activity   Alcohol use: Never    Alcohol/week: 0.0 standard drinks   Drug use: Never   Sexual activity: Not Currently    Birth control/protection: Surgical    Comment: 1st intercourse 71 yo-Fewer than 5 partners  Other Topics Concern   Not on file  Social History Narrative   Live with mom and son (64).   Social Determinants of Health   Financial Resource Strain: Not on file  Food Insecurity: Not on file  Transportation Needs: Not on file  Physical Activity: Not on file  Stress: Not on file  Social Connections: Not on file     Family History:  The patient's family history includes Alcohol abuse in her son; Alzheimer's disease in her father and mother; Breast cancer in her sister; Gout in her sister; Heart disease in her mother; Heart failure in her father; Hyperlipidemia in her mother; Hypertension in her father, mother, sister, and sister; Osteoporosis in her mother; Pancreatitis in her son.   ROS General: Negative; No fevers, chills, or night sweats;  HEENT: Negative; No changes in vision or hearing, sinus congestion, difficulty swallowing Pulmonary: Negative; No cough, wheezing, shortness of breath, hemoptysis Cardiovascular: Positive for hypertension, hyperlipidemia, CAD GI: Negative; No nausea, vomiting, diarrhea, or abdominal pain GU: Negative; No dysuria, hematuria, or difficulty voiding Musculoskeletal: Negative; no myalgias, joint pain, or weakness Hematologic/Oncology: Negative; no easy bruising, bleeding Endocrine: Negative; no heat/cold intolerance; no diabetes Neuro: Negative; no changes in balance, headaches Skin: Negative; No rashes or skin  lesions Psychiatric: Negative; No behavioral problems, depression Sleep: Positive for OSA; see HPI; no bruxism, restless legs, hypnogognic hallucinations, no cataplexy Other comprehensive 14 point system review is negative.   PHYSICAL EXAM:   VS:  BP 140/69   Pulse 66   Ht '5\' 5"'  (1.651 m)   Wt 192 lb 3.2 oz (87.2 kg)   SpO2 96%   BMI 31.98 kg/m     Repeat blood pressure me was 146/76  Wt Readings from Last 3 Encounters:  08/02/21 192 lb 3.2 oz (87.2 kg)  07/26/21 197 lb (89.4 kg)  06/23/21 196 lb 12.8 oz (89.3 kg)    General: Alert, oriented, no distress.  Skin: normal turgor, no rashes, warm and dry HEENT: Normocephalic, atraumatic. Pupils equal round and reactive to light; sclera anicteric; extraocular muscles intact; Fundi ** Nose without nasal septal hypertrophy Mouth/Parynx benign; Mallinpatti scale 4 Neck: No JVD, no carotid bruits; normal carotid upstroke Lungs: clear to ausculatation and percussion; no wheezing or rales Chest wall: without tenderness to palpitation Heart: PMI not displaced, RRR, s1 s2 normal, 1-6/1 systolic murmur early peaking in aortic area; no diastolic murmur, no rubs, gallops, thrills, or heaves Abdomen: soft, nontender; no hepatosplenomehaly, BS+; abdominal aorta nontender and not dilated by palpation. Back: no CVA tenderness Pulses 2+ Musculoskeletal: full range of motion, normal strength, no joint deformities Extremities: no clubbing cyanosis or edema, Homan's sign negative  Neurologic: grossly nonfocal; Cranial nerves grossly wnl Psychologic: Normal mood and affect   Studies/Labs Reviewed:   ECG (independently read by me): NSR at 66; NSTT changes    Recent Labs:    Latest Ref Rng & Units 06/14/2021    2:26 PM 12/07/2020    4:43 PM 11/24/2020    2:59 PM  BMP  Glucose 70 - 99 mg/dL 83   88   88    BUN 8 - 27 mg/dL '19   18   15    ' Creatinine 0.57 - 1.00 mg/dL 1.08   1.07   1.02    BUN/Creat Ratio 12 - '28 18   17   15    ' Sodium 134 -  144 mmol/L 143   141   139    Potassium 3.5 - 5.2  mmol/L 4.2   4.2   4.2    Chloride 96 - 106 mmol/L 102   102   101    CO2 20 - 29 mmol/L '26   22   25    ' Calcium 8.7 - 10.3 mg/dL 10.0   9.7   9.6          Latest Ref Rng & Units 06/19/2020    9:30 PM 05/25/2019    7:47 PM 10/06/2018   10:26 PM  Hepatic Function  Total Protein 6.5 - 8.1 g/dL 7.1   7.0   6.8    Albumin 3.5 - 5.0 g/dL 3.7   3.7   3.5    AST 15 - 41 U/L '27   24   19    ' ALT 0 - 44 U/L '20   25   15    ' Alk Phosphatase 38 - 126 U/L 81   90   97    Total Bilirubin 0.3 - 1.2 mg/dL 0.4   0.4   0.6    Bilirubin, Direct 0.0 - 0.2 mg/dL <0.1           Latest Ref Rng & Units 12/07/2020    4:43 PM 06/20/2020    5:13 PM 06/19/2020    9:30 PM  CBC  WBC 3.4 - 10.8 x10E3/uL 10.4   11.3   11.1    Hemoglobin 11.1 - 15.9 g/dL 12.5   12.6   12.1    Hematocrit 34.0 - 46.6 % 36.7   38.2   36.8    Platelets 150 - 450 x10E3/uL 303   292   286     Lab Results  Component Value Date   MCV 92 12/07/2020   MCV 97.9 06/20/2020   MCV 97.4 06/19/2020   Lab Results  Component Value Date   TSH 1.290 08/27/2018   Lab Results  Component Value Date   HGBA1C 5.9 01/31/2017     BNP No results found for: BNP  ProBNP No results found for: PROBNP   Lipid Panel     Component Value Date/Time   CHOL 204 (H) 06/14/2021 1426   TRIG 158 (H) 06/14/2021 1426   HDL 53 06/14/2021 1426   CHOLHDL 3.8 06/14/2021 1426   CHOLHDL 4.7 10/01/2015 0732   VLDL 27 10/01/2015 0732   LDLCALC 123 (H) 06/14/2021 1426   LABVLDL 28 06/14/2021 1426     RADIOLOGY: No results found.   Additional studies/ records that were reviewed today include:   CLINICAL INFORMATION Sleep Study Type: Split Night CPAP   Indication for sleep study: Excessive Daytime Sleepiness, Hypertension, Obesity   Epworth Sleepiness Score: 11   SLEEP STUDY TECHNIQUE As per the AASM Manual for the Scoring of Sleep and Associated Events v2.3 (April 2016) with a hypopnea  requiring 4% desaturations.   The channels recorded and monitored were frontal, central and occipital EEG, electrooculogram (EOG), submentalis EMG (chin), nasal and oral airflow, thoracic and abdominal wall motion, anterior tibialis EMG, snore microphone, electrocardiogram, and pulse oximetry. Continuous positive airway pressure (CPAP) was initiated when the patient met split night criteria and was titrated according to treat sleep-disordered breathing.   MEDICATIONS aspirin EC 81 MG tablet atorvastatin (LIPITOR) 80 MG tablet carvedilol (COREG) 25 MG tablet estradiol (ESTRACE VAGINAL) 0.1 MG/GM vaginal cream fluticasone (FLONASE) 50 MCG/ACT nasal spray furosemide (LASIX) 40 MG tablet gabapentin (NEURONTIN) 300 MG capsule isosorbide mononitrate (IMDUR) 30 MG 24 hr tablet losartan (COZAAR) 100 MG tablet Multiple Vitamin (MULITIVITAMIN WITH  MINERALS) TABS nitroGLYCERIN (NITROSTAT) 0.4 MG SL tablet oseltamivir (TAMIFLU) 75 MG capsule potassium chloride SA (KLOR-CON) 20 MEQ tablet promethazine-dextromethorphan (PROMETHAZINE-DM) 6.25-15 MG/5ML syrup triamcinolone cream (KENALOG) 0.1 %  Medications self-administered by patient taken the night of the study : N/A   RESPIRATORY PARAMETERS Diagnostic Total AHI (/hr):            22.0     RDI (/hr):         38.9     OA Index (/hr):            0.5       CA Index (/hr):      0.0 REM AHI (/hr):            70.6     NREM AHI (/hr):          18.6     Supine AHI (/hr):         0.0       Non-supine AHI (/hr):        23.1 Min O2 Sat (%):          84.0     Mean O2 (%):  94.2     Time below 88% (min):           1.4          Titration Optimal Pressure (cm):           15        AHI at Optimal Pressure (/hr):            0          Min O2 at Optimal Pressure (%):       94.0 Supine % at Optimal (%):       0          Sleep % at Optimal (%):         97           SLEEP ARCHITECTURE The recording time for the entire night was 398.5 minutes.   During a baseline  period of 197.2 minutes, the patient slept for 131.0 minutes in REM and nonREM, yielding a sleep efficiency of 66.4%%. Sleep onset after lights out was 27.3 minutes with a REM latency of 156.5 minutes. The patient spent 12.6%% of the night in stage N1 sleep, 80.9%% in stage N2 sleep, 0.0%% in stage N3 and 6.5% in REM.   During the titration period of 191.1 minutes, the patient slept for 130.0 minutes in REM and nonREM, yielding a sleep efficiency of 68.0%%. Sleep onset after CPAP initiation was 53.9 minutes with a REM latency of 54.0 minutes. The patient spent 6.9%% of the night in stage N1 sleep, 52.7%% in stage N2 sleep, 0.0%% in stage N3 and 40.4% in REM.   CARDIAC DATA The 2 lead EKG demonstrated sinus rhythm. The mean heart rate was 100.0 beats per minute. Other EKG findings include: None.   LEG MOVEMENT DATA The total Periodic Limb Movements of Sleep (PLMS) were 0. The PLMS index was 0.0 .   IMPRESSIONS - Moderate obstructive sleep apnea occurred during the diagnostic portion of the study (AHI 22.0/h, RDI 38.9/h); events were very severe during REM sleep (AHI 70.6/h).  CPAP was initiated aty 5 cm and was titrated to 15 cm of water; AHI 0, but REM sleep did not occur at 15 cm of water.  - The patient had oxygen desaturation during the diagnostic portion of the study to a nadir of 84.0%. - The patient  snored with moderate snoring volume during the diagnostic portion of the study. - No cardiac abnormalities were noted during this study. - Clinically significant periodic limb movements did not occur during sleep.   DIAGNOSIS - Obstructive Sleep Apnea (G47.33)   RECOMMENDATIONS - Recommend a initial trial of CPAP Auto therapy with EPR of 3 on 15 - 20 cm H2O with heated humidification. A Small size Resmed Full Face Mask AirFit F20 mask was used for the titration study. - Effort should be made to optimize nasal and oropharyngeal patency. - Avoid alcohol, sedatives and other CNS depressants that  may worsen sleep apnea and disrupt normal sleep architecture. - Sleep hygiene should be reviewed to assess factors that may improve sleep quality. - Weight management (BMI 32) and regular exercise should be initiated or continued. - Recommend a download and sleep clinic evaluation after 4 weeks of therapy.    ASSESSMENT:    1. OSA    2. Daytime somnolence   3. Snoring   4. Coronary artery disease of native artery of native heart with stable angina pectoris ([place])   5. Hypertension, unspecified type   6. Heart murmur   7. Hyperlipidemia with target LDL less than 70     PLAN:  [name] is a 71 year old patient of Dr. Oswaldo Milian who has cardiovascular comorbidities including hypertension, hyperlipidemia, prior renal infarct, CAD and was recently diagnosed with moderate overall sleep apnea with an AHI of 22.0/h; however her sleep apnea was very severe during REM sleep with an AHI of 70.6/h.  She had significant oxygen desaturation to a nadir of 84%.  She did not receive her CPAP auto AirSense 11 unit until May 19, 2021.  Apparently, she had difficulty with the machine and felt she needed someone to help her get it set up.  As result, she did not start using therapy until July 08, 2021.  Compliance to date is suboptimal and usage per night is very low at only 2 hours and 12 minutes.  She has been using a full facemask.  At her most recent pressure setting of 15 to 20 cm, AHI remained elevated at 14.1.  I had a very long discussion with her in the office today.  She did not have any previous good insight into sleep apnea and its potential adverse cardiovascular consequences and the importance of use.  I discussed with her normal sleep architecture and potential disruption of this with untreated sleep apnea.  She is on multiple drug regimen for hypertension and I discussed its implications with reference to blood pressure control and potential resistant hypertension, risk for  nocturnal palpitations, increased risk for development of atrial fibrillation, and also discussed its effects on insulin resistance, inflammation, and GERD.  She also has known CAD with subtotal distal RCA occlusion with left-to-right collaterals and mild proximal LAD disease for which she is on medical therapy.  I discussed potential sleep apnea mediated nocturnal hypoxemia which can contribute to nocturnal ischemia particularly with her underlying CAD.  I also discussed its implications with reference to increased nocturia.  After much discussion, she has a much better understanding as to why it is important to use therapy.  I discussed optimal sleep duration for an adult at 7 and 9 hours and that she should use CPAP for the nights entirety.  In addition, her sleep apnea is very severe during REM sleep and with the preponderance of REM sleep occurring in the second half of the night I stressed the importance  of continued use for the entire nights duration.  We will try to contact her DME company so that they will give her more time from her initial use date of April 14 rather than using her set up start date as May 19, 2021.  We will need to obtain a new download in a month if she still has her machine and hopefully she will meet compliance standards.  Her blood pressure today was mildly elevated on her multiple drug regimen.  I discussed her target blood pressure less than 130/80.  He is on Zetia and atorvastatin for hyperlipidemia with target LDL less than 70.  I will see her in 6 to 8 weeks for follow-up evaluation of her sleep compliance. She will be following up with Rollen Sox, RPH in addition to Dr. Gardiner Rhyme.   Time spent: 40 minutes Medication Adjustments/Labs and Tests Ordered: Current medicines are reviewed at length with the patient today.  Concerns regarding medicines are outlined above.  Medication changes, Labs and Tests ordered today are listed in the Patient Instructions  below. Patient Instructions  Medication Instructions:   No changes  *If you need a refill on your cardiac medications before your next appointment, please call your pharmacy*   Lab Work:  Not needed   Testing/Procedures:  Not needed  Follow-Up: At [facility], you and your health needs are our priority.  As part of our continuing mission to provide you with exceptional heart care, we have created designated Provider Care Teams.  These Care Teams include your primary Cardiologist (physician) and Advanced Practice Providers (APPs -  Physician Assistants and Nurse Practitioners) who all work together to provide you with the care you need, when you need it.     Your next appointment:   6  to 8 week(s) sleep compliance  The format for your next appointment:   In Person  Provider:   Dr Claiborne Billings    Other Instructions   Use your C-PAP machine everynight   Signed, Shelva Majestic, MD  08/11/2021 4:45 PM    [facility] [address], [place], [place], [place]  [address] Phone: [phone]

## 2021-08-03 DIAGNOSIS — Z139 Encounter for screening, unspecified: Secondary | ICD-10-CM

## 2021-08-04 ENCOUNTER — Telehealth: Payer: Self-pay | Admitting: Cardiovascular Disease

## 2021-08-04 NOTE — Telephone Encounter (Signed)
New Message: ? ? ? ? ?Patient says she needs a letter stating that she needs  her C-Pap machine. ?

## 2021-08-04 NOTE — Telephone Encounter (Signed)
Patient called stating she needs a letter from Dr. Claiborne Billings so her insurance company will continue to pay for her CPAP machine.  ?

## 2021-08-04 NOTE — Telephone Encounter (Signed)
Pt called stated that her CPAP machine is not reading right and would like a call back.  ?

## 2021-08-08 NOTE — Telephone Encounter (Signed)
Please advise. She still is not using even after your visit.

## 2021-08-11 ENCOUNTER — Encounter: Payer: Self-pay | Admitting: Cardiovascular Disease

## 2021-08-16 DIAGNOSIS — G4733 Obstructive sleep apnea (adult) (pediatric): Secondary | ICD-10-CM | POA: Diagnosis not present

## 2021-08-17 NOTE — Congregational Nurse Program (Signed)
  Dept: [phone]   Congregational Nurse Program Note  Date of Encounter: 08/10/2021  Past Medical History: Past Medical History:  Diagnosis Date   Abnormal EKG 2012   hospitalized for T wave inversion in lateral leads with MSK chest pains, normal ECHO, negative trops, no cardiology consult. recent EKG 7/15 stable T wave inversions.    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 Yes    Location Patient [place]   Visit Setting Church or Organization    Patient Status Unknown    Insurance Medicare    Insurance Referral N/A    Medication N/A    Medical Provider Yes    Screening Referrals N/A    Medical Referral N/A    Medical Appointment Made N/A    Food N/A    Transportation N/A    Housing/Utilities N/A    Interpersonal Safety N/A    Intervention Blood pressure;Counsel;Educate;Spiritual Care;Support    ED Visit Averted N/A    Life-Saving Intervention Made N/A             Today's Vitals   08/10/21 1320  BP: (!) 154/74  Pulse: 72  Resp: 18  Temp: 98.6 F (37 C)  TempSrc: Oral  SpO2: 97%  Weight: 198 lb (89.8 kg)   Body mass index is 32.95 kg/m.  Nurse discussed with patient ranges for hypertension. Also patient discussed about concerns with weight loss. Plan of care made with patient concerning weight loss and increasing physical activity. Patient stated she gain weight with the surgeries on knee when she was more immobile. Nurse going to keep providing patient classes on healthy eating, weight loss management, and exercise.   Andrez Grime, BSN, RN, CRRN,CMSRN

## 2021-08-19 ENCOUNTER — Other Ambulatory Visit: Payer: Self-pay | Admitting: Family Medicine

## 2021-08-19 DIAGNOSIS — E876 Hypokalemia: Secondary | ICD-10-CM

## 2021-08-30 ENCOUNTER — Encounter: Payer: Self-pay | Admitting: *Deleted

## 2021-09-14 ENCOUNTER — Ambulatory Visit (INDEPENDENT_AMBULATORY_CARE_PROVIDER_SITE_OTHER): Payer: Medicare Other | Admitting: Cardiovascular Disease

## 2021-09-14 ENCOUNTER — Encounter: Payer: Self-pay | Admitting: Cardiovascular Disease

## 2021-09-14 VITALS — BP 142/76 | HR 68 | Ht 65.0 in | Wt 198.8 lb

## 2021-09-14 DIAGNOSIS — G4730 Sleep apnea, unspecified: Secondary | ICD-10-CM

## 2021-09-14 DIAGNOSIS — G4733 Obstructive sleep apnea (adult) (pediatric): Secondary | ICD-10-CM

## 2021-09-14 DIAGNOSIS — I25118 Atherosclerotic heart disease of native coronary artery with other forms of angina pectoris: Secondary | ICD-10-CM

## 2021-09-14 DIAGNOSIS — R0683 Snoring: Secondary | ICD-10-CM

## 2021-09-14 DIAGNOSIS — R400 Somnolence: Secondary | ICD-10-CM | POA: Diagnosis not present

## 2021-09-14 DIAGNOSIS — I10 Essential (primary) hypertension: Secondary | ICD-10-CM

## 2021-09-14 DIAGNOSIS — E785 Hyperlipidemia, unspecified: Secondary | ICD-10-CM | POA: Diagnosis not present

## 2021-09-14 MED ORDER — ISOSORBIDE MONONITRATE ER 30 MG PO TB24: 30.0000 mg | ORAL_TABLET | Freq: Every day | ORAL | 3 refills | Status: DC

## 2021-09-14 NOTE — Progress Notes (Unsigned)
Cardiology Office Note    Date:  09/14/2021   ID:  Shey, [name], [name], MRN [id number]  PCP:  Concepcion Living, MD  Cardiologist:  Shelva Majestic, MD (sleep); Dr. Oswaldo Milian  F/U  sleep evaluation   History of Present Illness:  [name] is a 71 y.o. female who is followed by Dr. Gardiner Rhyme for cardiology clearance.  She has a history of hypertension, hyperlipidemia, CAD with coronary calcium score of 936 and subtotal occlusion of her distal RCA with left-to-right collaterals and mild proximal LAD disease.  Due to concerns for obstructive sleep apnea with symptoms of snoring, daytime sleepiness, nonrestorative sleep in addition to hypertension she is referred for an in lab sleep study which was done on January 12, 2021.  She met split-night criteria and was found to have moderate overall sleep apnea with an AHI of 22.0 although RDI was significantly more increased at 38.9/h and she had severe sleep apnea during REM sleep with an AHI of 70.6/h.  There was oxygen desaturation during the diagnostic portion of the study to a nadir of 84% and she snored with moderate snoring volume.  CPAP was instituted was titrated to 15 cm of water where AHI was 0 but REM sleep did not occur at 15 cm.  Due to supply chain delay issues, she did not receive her new ResMed air sense 11 AutoSet unit until May 19, 2021.  Currently, she needed someone to help her set it up and for this reason delayed initiation of therapy until July 08, 2020.  A download was obtained from April 9 through Aug 01, 2021 and EPAP was not initiated until April 14.  As result this 30-day window shows reduced compliance at 60% and she only had minimum usage at 2 hours and 12 minutes when used.  Her CPAP is set at a pressure range of 15 to Huang cm.  AHI is elevated at 14.0.  Her 95th percentile pressure is 15.3.  Presently, she goes to bed at 10 and wakes up at 6.  An Epworth Sleepiness Scale score was calculated and this  endorsed 9.  She is on a regimen of amlodipine 10 mg, carvedilol 25 mg twice a day, furosemide 40 mg, HCTZ, losartan 100 mg daily and isosorbide 15 mg for hypertension and CAD.  She is on Zetia and atorvastatin 80 mg for hyperlipidemia.  She presents for initial sleep evaluation.   Past Medical History:  Diagnosis Date   Abnormal EKG 2012   hospitalized for T wave inversion in lateral leads with MSK chest pains, normal ECHO, negative trops, no cardiology consult. recent EKG 7/15 stable T wave inversions.    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   blood clot in the kidney    Past Surgical History:  Procedure Laterality Date   ABDOMINAL SURGERY  2010   for bowel blockage   COLON SURGERY  2010   bowel blockage   COLONOSCOPY  09/2014   HYSTEROSCOPY WITH D & C N/A 05/08/2018   Procedure: DILATATION AND CURETTAGE /HYSTEROSCOPY;  Surgeon: Mora Bellman, MD;  Location: [place];  Service: Gynecology;  Laterality: N/A;   KNEE ARTHROSCOPY Left 09/25/2013   Procedure: LEFT KNEE ARTHROSCOPY WITH PARTIAL  MEDIAL AND LATERAL  MENISCECTOMY/DEBRIDEMENT/MICRO FRACTURE MEDIAL FEMORAL CONDYLE, REMOVAL OF OSTEOCONDRAL FRAGMENT;  Surgeon: Johnn Hai, MD;  Location: [facility];  Service: Orthopedics;  Laterality: Left;   KNEE SURGERY Right 1999   LEFT HEART CATH AND CORONARY ANGIOGRAPHY N/A 12/13/2020   Procedure: LEFT HEART CATH AND CORONARY ANGIOGRAPHY;  Surgeon: Nelva Bush, MD;  Location: [facility];  Service: Cardiovascular;  Laterality: N/A;   POLYPECTOMY     TEE WITHOUT CARDIOVERSION N/A 10/04/2015   Procedure: TRANSESOPHAGEAL ECHOCARDIOGRAM (TEE);  Surgeon: Sueanne Margarita, MD;  Location: [facility];  Service: Cardiovascular;  Laterality: N/A;   TUBAL LIGATION  1978     Current Medications: Outpatient Medications Prior to Visit  Medication Sig Dispense Refill   amLODipine (NORVASC) 10 MG tablet Take 1 tablet (10 mg total) by mouth daily. 90 tablet 1   aspirin EC 81 MG tablet Take 1 tablet (81 mg total) daily by mouth. 30 tablet 11   atorvastatin (LIPITOR) 80 MG tablet Take 1 tablet (80 mg total) by mouth daily at 6 PM. (Patient taking differently: Take 40 mg by mouth daily at 6 PM.) 90 tablet 3   carvedilol (COREG) 12.5 MG tablet Take 12.5 mg by mouth 2 (two) times daily.     fluticasone (FLONASE) 50 MCG/ACT nasal spray Place 2 sprays into both nostrils 2 (two) times daily. 18.2 mL 3   furosemide (LASIX) 40 MG tablet Take 1 tablet (40 mg total) by mouth daily. 30 tablet 5   gabapentin (NEURONTIN) 300 MG capsule Take 300 mg by mouth 2 (two) times daily.     hydrochlorothiazide (HYDRODIURIL) 25 MG tablet Take 1 tablet (25 mg total) by mouth daily. 90 tablet 1   losartan (COZAAR) 100 MG tablet Take 1 tablet (100 mg total) by mouth daily. 90 tablet 3   MELOXICAM PO Take 1 tablet by mouth daily in the afternoon.     Multiple Vitamin (MULITIVITAMIN WITH MINERALS) TABS Take 1 tablet by mouth daily.     nitroGLYCERIN (NITROSTAT) 0.4 MG SL tablet Place 1 tablet (0.4 mg total) under the tongue every 5 (five) minutes as needed. 25 tablet 3   potassium chloride SA (KLOR-CON M) Huang MEQ tablet TAKE 1 TABLET(Huang MEQ) BY MOUTH DAILY 90 tablet 3   isosorbide mononitrate (IMDUR) 30 MG 24 hr tablet Take 0.5 tablets (15 mg total) by mouth daily. 30 tablet 5   fluconazole (DIFLUCAN) 150 MG tablet Take 150 mg by mouth once. (Patient not taking: Reported on 09/14/2021)     carvedilol (COREG) 25 MG tablet Take 1 tablet (25 mg total) by mouth 2 (two) times daily with a meal. (Patient not taking: Reported on 09/14/2021) 180 tablet 3   ezetimibe (ZETIA) 10 MG tablet Take 1 tablet (10 mg total) by mouth daily. (Patient not taking: Reported on 09/14/2021) 90 tablet 3   No  facility-administered medications prior to visit.     Allergies:   Shrimp [shellfish allergy], Latex, and Penicillins   Social History   Socioeconomic History   Marital status: Single    Spouse name: Not on file   Number of children: 2   Years of education: some colle   Highest education level: Not on file  Occupational History   Occupation: Custodian     Employer: [employer]  Tobacco Use   Smoking status: Former    Types: Cigarettes    Quit date: 07/29/2010    Years since quitting: 11.1   Smokeless tobacco: Never  Vaping Use   Vaping Use: Never used  Substance and Sexual Activity   Alcohol use: Never    Alcohol/week: 0.0 standard drinks of alcohol   Drug use: Never   Sexual activity: Not Currently    Birth control/protection: Surgical    Comment: 1st intercourse 71 yo-Fewer than 5 partners  Other Topics Concern   Not on file  Social History Narrative   Live with mom and son (48).   Social Determinants of Health   Financial Resource Strain: Not on file  Food Insecurity: Not on file  Transportation Needs: Not on file  Physical Activity: Not on file  Stress: Not on file  Social Connections: Not on file     Family History:  The patient's family history includes Alcohol abuse in her son; Alzheimer's disease in her father and mother; Breast cancer in her sister; Gout in her sister; Heart disease in her mother; Heart failure in her father; Hyperlipidemia in her mother; Hypertension in her father, mother, sister, and sister; Osteoporosis in her mother; Pancreatitis in her son.   ROS General: Negative; No fevers, chills, or night sweats;  HEENT: Negative; No changes in vision or hearing, sinus congestion, difficulty swallowing Pulmonary: Negative; No cough, wheezing, shortness of breath, hemoptysis Cardiovascular: Positive for hypertension, hyperlipidemia, CAD GI: Negative; No nausea, vomiting, diarrhea, or abdominal pain GU: Negative; No dysuria, hematuria,  or difficulty voiding Musculoskeletal: Negative; no myalgias, joint pain, or weakness Hematologic/Oncology: Negative; no easy bruising, bleeding Endocrine: Negative; no heat/cold intolerance; no diabetes Neuro: Negative; no changes in balance, headaches Skin: Negative; No rashes or skin lesions Psychiatric: Negative; No behavioral problems, depression Sleep: Positive for OSA; see HPI; no bruxism, restless legs, hypnogognic hallucinations, no cataplexy Other comprehensive 14 point system review is negative.   PHYSICAL EXAM:   VS:  BP (!) 142/76 (BP Location: Left Arm, Patient Position: Sitting, Cuff Size: Large)   Pulse 68   Ht _0  (1.651 m)   Wt 198 lb 12.8 oz (90.2 kg)   SpO2 99%   BMI 33.08 kg/m     Repeat blood pressure me was 146/76  Wt Readings from Last 3 Encounters:  09/14/21 198 lb 12.8 oz (90.2 kg)  08/10/21 198 lb (89.8 kg)  08/02/21 192 lb 3.2 oz (87.2 kg)    General: Alert, oriented, no distress.  Skin: normal turgor, no rashes, warm and dry HEENT: Normocephalic, atraumatic. Pupils equal round and reactive to light; sclera anicteric; extraocular muscles intact; Fundi ** Nose without nasal septal hypertrophy Mouth/Parynx benign; Mallinpatti scale 4 Neck: No JVD, no carotid bruits; normal carotid upstroke Lungs: clear to ausculatation and percussion; no wheezing or rales Chest wall: without tenderness to palpitation Heart: PMI not displaced, RRR, s1 s2 normal, 0-9/3 systolic murmur early peaking in aortic area; no diastolic murmur, no rubs, gallops, thrills, or heaves Abdomen: soft, nontender; no hepatosplenomehaly, BS+; abdominal aorta nontender and not dilated by palpation. Back: no CVA tenderness Pulses 2+ Musculoskeletal: full range of motion, normal strength, no joint deformities Extremities: no clubbing cyanosis or edema, Homan's sign negative  Neurologic: grossly nonfocal; Cranial nerves grossly wnl Psychologic: Normal mood and affect   Studies/Labs  Reviewed:   ECG (independently read by me): Sinus rhythm at 68; PAC, T wave abnormality  Aug 02, 2021 ECG (independently read by me): NSR at 66; NSTT changes    Recent Labs:    Latest Ref Rng & Units 06/14/2021    2:26 PM 12/07/2020    4:43 PM 11/24/2020  2:59 PM  BMP  Glucose 70 - 99 mg/dL 83  88  88   BUN 8 - 27 mg/dL _0 Creatinine 0.57 - 1.00 mg/dL 1.08  1.07  1.02   BUN/Creat Ratio 12 - _1 Sodium 134 - 144 mmol/L 143  141  139   Potassium 3.5 - 5.2 mmol/L 4.2  4.2  4.2   Chloride 96 - 106 mmol/L 102  102  101   CO2 Huang - 29 mmol/L _2 Calcium 8.7 - 10.3 mg/dL 10.0  9.7  9.6         Latest Ref Rng & Units 06/19/2020    9:30 PM 05/25/2019    7:47 PM 10/06/2018   10:26 PM  Hepatic Function  Total Protein 6.5 - 8.1 g/dL 7.1  7.0  6.8   Albumin 3.5 - 5.0 g/dL 3.7  3.7  3.5   AST 15 - 41 U/L _3 ALT 0 - 44 U/L _4 Alk Phosphatase 38 - 126 U/L 81  90  97   Total Bilirubin 0.3 - 1.2 mg/dL 0.4  0.4  0.6   Bilirubin, Direct 0.0 - 0.2 mg/dL <0.1          Latest Ref Rng & Units 12/07/2020    4:43 PM 06/20/2020    5:13 PM 06/19/2020    9:30 PM  CBC  WBC 3.4 - 10.8 x10E3/uL 10.4  11.3  11.1   Hemoglobin 11.1 - 15.9 g/dL 12.5  12.6  12.1   Hematocrit 34.0 - 46.6 % 36.7  38.2  36.8   Platelets 150 - 450 x10E3/uL 303  292  286    Lab Results  Component Value Date   MCV 92 12/07/2020   MCV 97.9 06/20/2020   MCV 97.4 06/19/2020   Lab Results  Component Value Date   TSH 1.290 08/27/2018   Lab Results  Component Value Date   HGBA1C 5.9 01/31/2017     BNP No results found for: "BNP"  ProBNP No results found for: "PROBNP"   Lipid Panel     Component Value Date/Time   CHOL 204 (H) 06/14/2021 1426   TRIG 158 (H) 06/14/2021 1426   HDL 53 06/14/2021 1426   CHOLHDL 3.8 06/14/2021 1426   CHOLHDL 4.7 10/01/2015 0732   VLDL 27 10/01/2015 0732   LDLCALC 123 (H) 06/14/2021 1426   LABVLDL 28 06/14/2021 1426      RADIOLOGY: No results found.   Additional studies/ records that were reviewed today include:   CLINICAL INFORMATION Sleep Study Type: Split Night CPAP   Indication for sleep study: Excessive Daytime Sleepiness, Hypertension, Obesity   Epworth Sleepiness Score: 11   SLEEP STUDY TECHNIQUE As per the AASM Manual for the Scoring of Sleep and Associated Events v2.3 (April 2016) with a hypopnea requiring 4% desaturations.   The channels recorded and monitored were frontal, central and occipital EEG, electrooculogram (EOG), submentalis EMG (chin), nasal and oral airflow, thoracic and abdominal wall motion, anterior tibialis EMG, snore microphone, electrocardiogram, and pulse oximetry. Continuous positive airway pressure (CPAP) was initiated when the patient met split night criteria and was titrated according to treat sleep-disordered breathing.   MEDICATIONS aspirin EC 81 MG tablet atorvastatin (LIPITOR) 80 MG tablet carvedilol (COREG) 25 MG tablet estradiol (ESTRACE VAGINAL) 0.1 MG/GM vaginal cream fluticasone (FLONASE) 50 MCG/ACT nasal spray furosemide (  LASIX) 40 MG tablet gabapentin (NEURONTIN) 300 MG capsule isosorbide mononitrate (IMDUR) 30 MG 24 hr tablet losartan (COZAAR) 100 MG tablet Multiple Vitamin (MULITIVITAMIN WITH MINERALS) TABS nitroGLYCERIN (NITROSTAT) 0.4 MG SL tablet oseltamivir (TAMIFLU) 75 MG capsule potassium chloride SA (KLOR-CON) Huang MEQ tablet promethazine-dextromethorphan (PROMETHAZINE-DM) 6.25-15 MG/5ML syrup triamcinolone cream (KENALOG) 0.1 %  Medications self-administered by patient taken the night of the study : N/A   RESPIRATORY PARAMETERS Diagnostic Total AHI (/hr):            22.0     RDI (/hr):         38.9     OA Index (/hr):            0.5       CA Index (/hr):      0.0 REM AHI (/hr):            70.6     NREM AHI (/hr):          18.6     Supine AHI (/hr):         0.0       Non-supine AHI (/hr):        23.1 Min O2 Sat (%):          84.0      Mean O2 (%):  94.2     Time below 88% (min):           1.4          Titration Optimal Pressure (cm):           15        AHI at Optimal Pressure (/hr):            0          Min O2 at Optimal Pressure (%):       94.0 Supine % at Optimal (%):       0          Sleep % at Optimal (%):         97           SLEEP ARCHITECTURE The recording time for the entire night was 398.5 minutes.   During a baseline period of 197.2 minutes, the patient slept for 131.0 minutes in REM and nonREM, yielding a sleep efficiency of 66.4%%. Sleep onset after lights out was 27.3 minutes with a REM latency of 156.5 minutes. The patient spent 12.6%% of the night in stage N1 sleep, 80.9%% in stage N2 sleep, 0.0%% in stage N3 and 6.5% in REM.   During the titration period of 191.1 minutes, the patient slept for 130.0 minutes in REM and nonREM, yielding a sleep efficiency of 68.0%%. Sleep onset after CPAP initiation was 53.9 minutes with a REM latency of 54.0 minutes. The patient spent 6.9%% of the night in stage N1 sleep, 52.7%% in stage N2 sleep, 0.0%% in stage N3 and 40.4% in REM.   CARDIAC DATA The 2 lead EKG demonstrated sinus rhythm. The mean heart rate was 100.0 beats per minute. Other EKG findings include: None.   LEG MOVEMENT DATA The total Periodic Limb Movements of Sleep (PLMS) were 0. The PLMS index was 0.0 .   IMPRESSIONS - Moderate obstructive sleep apnea occurred during the diagnostic portion of the study (AHI 22.0/h, RDI 38.9/h); events were very severe during REM sleep (AHI 70.6/h).  CPAP was initiated aty 5 cm and was titrated to 15 cm of water; AHI 0, but REM sleep did not occur at  15 cm of water.  - The patient had oxygen desaturation during the diagnostic portion of the study to a nadir of 84.0%. - The patient snored with moderate snoring volume during the diagnostic portion of the study. - No cardiac abnormalities were noted during this study. - Clinically significant periodic limb movements did not  occur during sleep.   DIAGNOSIS - Obstructive Sleep Apnea (G47.33)   RECOMMENDATIONS - Recommend a initial trial of CPAP Auto therapy with EPR of 3 on 15 - Huang cm H2O with heated humidification. A Small size Resmed Full Face Mask AirFit F20 mask was used for the titration study. - Effort should be made to optimize nasal and oropharyngeal patency. - Avoid alcohol, sedatives and other CNS depressants that may worsen sleep apnea and disrupt normal sleep architecture. - Sleep hygiene should be reviewed to assess factors that may improve sleep quality. - Weight management (BMI 32) and regular exercise should be initiated or continued. - Recommend a download and sleep clinic evaluation after 4 weeks of therapy.    ASSESSMENT:    1. Coronary artery disease of native artery of native heart with stable angina pectoris ([place])   2. Sleep apnea, unspecified type     PLAN:  Ms. Jakiah Bienaime is a 71 year old patient of Dr. Oswaldo Milian who has cardiovascular comorbidities including hypertension, hyperlipidemia, prior renal infarct, CAD and was recently diagnosed with moderate overall sleep apnea with an AHI of 22.0/h; however her sleep apnea was very severe during REM sleep with an AHI of 70.6/h.  She had significant oxygen desaturation to a nadir of 84%.  She did not receive her CPAP auto AirSense 11 unit until May 19, 2021.  Apparently, she had difficulty with the machine and felt she needed someone to help her get it set up.  As result, she did not start using therapy until July 08, 2021.  Compliance to date is suboptimal and usage per night is very low at only 2 hours and 12 minutes.  She has been using a full facemask.  At her most recent pressure setting of 15 to Huang cm, AHI remained elevated at 14.1.  I had a very long discussion with her in the office today.  She did not have any previous good insight into sleep apnea and its potential adverse cardiovascular consequences and the  importance of use.  I discussed with her normal sleep architecture and potential disruption of this with untreated sleep apnea.  She is on multiple drug regimen for hypertension and I discussed its implications with reference to blood pressure control and potential resistant hypertension, risk for nocturnal palpitations, increased risk for development of atrial fibrillation, and also discussed its effects on insulin resistance, inflammation, and GERD.  She also has known CAD with subtotal distal RCA occlusion with left-to-right collaterals and mild proximal LAD disease for which she is on medical therapy.  I discussed potential sleep apnea mediated nocturnal hypoxemia which can contribute to nocturnal ischemia particularly with her underlying CAD.  I also discussed its implications with reference to increased nocturia.  After much discussion, she has a much better understanding as to why it is important to use therapy.  I discussed optimal sleep duration for an adult at 7 and 9 hours and that she should use CPAP for the nights entirety.  In addition, her sleep apnea is very severe during REM sleep and with the preponderance of REM sleep occurring in the second half of the night I stressed the importance of continued  use for the entire nights duration.  We will try to contact her DME company so that they will give her more time from her initial use date of April 14 rather than using her set up start date as May 19, 2021.  We will need to obtain a new download in a month if she still has her machine and hopefully she will meet compliance standards.  Her blood pressure today was mildly elevated on her multiple drug regimen.  I discussed her target blood pressure less than 130/80.  He is on Zetia and atorvastatin for hyperlipidemia with target LDL less than 70.  I will see her in 6 to 8 weeks for follow-up evaluation of her sleep compliance. She will be following up with Rollen Sox, RPH in addition to Dr.  Gardiner Rhyme.   Time spent: 40 minutes Medication Adjustments/Labs and Tests Ordered: Current medicines are reviewed at length with the patient today.  Concerns regarding medicines are outlined above.  Medication changes, Labs and Tests ordered today are listed in the Patient Instructions below. Patient Instructions  Medication Instructions:  INCREASE Imdur to 30 mg daily *If you need a refill on your cardiac medications before your next appointment, please call your pharmacy*  Lab Work: NONE ordered at this time of appointment   If you have labs (blood work) drawn today and your tests are completely normal, you will receive your results only by: [place] (if you have MyChart) OR A paper copy in the mail If you have any lab test that is abnormal or we need to change your treatment, we will call you to review the results.   Testing/Procedures: NONE ordered at this time of appointment   Follow-Up: At [facility], you and your health needs are our priority.  As part of our continuing mission to provide you with exceptional heart care, we have created designated Provider Care Teams.  These Care Teams include your primary Cardiologist (physician) and Advanced Practice Providers (APPs -  Physician Assistants and Nurse Practitioners) who all work together to provide you with the care you need, when you need it.   Your next appointment:   1 month(s)  The format for your next appointment:   In Person  Provider:   Donato Heinz, MD     Other Instructions   Important Information About Sugar         Signed, Shelva Majestic, MD  09/14/2021 3:23 PM    [facility] [address], [place], [place], [place]  [address] Phone: [phone]

## 2021-09-14 NOTE — Patient Instructions (Signed)
Medication Instructions:  INCREASE Imdur to 30 mg daily *If you need a refill on your cardiac medications before your next appointment, please call your pharmacy*  Lab Work: NONE ordered at this time of appointment   If you have labs (blood work) drawn today and your tests are completely normal, you will receive your results only by: [place] (if you have MyChart) OR A paper copy in the mail If you have any lab test that is abnormal or we need to change your treatment, we will call you to review the results.   Testing/Procedures: NONE ordered at this time of appointment   Follow-Up: At [facility], you and your health needs are our priority.  As part of our continuing mission to provide you with exceptional heart care, we have created designated Provider Care Teams.  These Care Teams include your primary Cardiologist (physician) and Advanced Practice Providers (APPs -  Physician Assistants and Nurse Practitioners) who all work together to provide you with the care you need, when you need it.   Your next appointment:   1 month(s)  The format for your next appointment:   In Person  Provider:   Donato Heinz, MD     Other Instructions   Important Information About Sugar

## 2021-09-17 ENCOUNTER — Emergency Department (HOSPITAL_COMMUNITY)
Admission: EM | Admit: 2021-09-17 | Discharge: 2021-09-18 | Disposition: A | Discharge status: Home / Self Care | Payer: Medicare Other | Attending: Emergency Medicine | Admitting: Emergency Medicine

## 2021-09-17 ENCOUNTER — Other Ambulatory Visit: Payer: Self-pay

## 2021-09-17 ENCOUNTER — Emergency Department (HOSPITAL_COMMUNITY): Payer: Medicare Other

## 2021-09-17 DIAGNOSIS — Z7982 Long term (current) use of aspirin: Secondary | ICD-10-CM | POA: Insufficient documentation

## 2021-09-17 DIAGNOSIS — Z79899 Other long term (current) drug therapy: Secondary | ICD-10-CM | POA: Insufficient documentation

## 2021-09-17 DIAGNOSIS — I10 Essential (primary) hypertension: Secondary | ICD-10-CM | POA: Diagnosis not present

## 2021-09-17 DIAGNOSIS — D649 Anemia, unspecified: Secondary | ICD-10-CM

## 2021-09-17 DIAGNOSIS — R079 Chest pain, unspecified: Secondary | ICD-10-CM

## 2021-09-17 DIAGNOSIS — D72829 Elevated white blood cell count, unspecified: Secondary | ICD-10-CM | POA: Insufficient documentation

## 2021-09-17 DIAGNOSIS — E876 Hypokalemia: Secondary | ICD-10-CM | POA: Insufficient documentation

## 2021-09-17 DIAGNOSIS — Z91040 Latex allergy status: Secondary | ICD-10-CM | POA: Diagnosis not present

## 2021-09-17 DIAGNOSIS — I2510 Atherosclerotic heart disease of native coronary artery without angina pectoris: Secondary | ICD-10-CM | POA: Insufficient documentation

## 2021-09-17 DIAGNOSIS — L089 Local infection of the skin and subcutaneous tissue, unspecified: Secondary | ICD-10-CM

## 2021-09-17 DIAGNOSIS — R0789 Other chest pain: Secondary | ICD-10-CM | POA: Diagnosis not present

## 2021-09-17 LAB — BASIC METABOLIC PANEL
Anion gap: 14 (ref 5–15)
BUN: 23 mg/dL (ref 8–23)
CO2: 23 mmol/L (ref 22–32)
Calcium: 9 mg/dL (ref 8.9–10.3)
Chloride: 102 mmol/L (ref 98–111)
Creatinine, Ser: 1.18 mg/dL — ABNORMAL HIGH (ref 0.44–1.00)
GFR, Estimated: 49 mL/min — ABNORMAL LOW (ref >60)
Glucose, Bld: 112 mg/dL — ABNORMAL HIGH (ref 70–99)
Potassium: 3.3 mmol/L — ABNORMAL LOW (ref 3.5–5.1)
Sodium: 139 mmol/L (ref 135–145)

## 2021-09-17 LAB — CBC
HCT: 35.9 % — ABNORMAL LOW (ref 36.0–46.0)
Hemoglobin: 11.6 g/dL — ABNORMAL LOW (ref 12.0–15.0)
MCH: 31.5 pg (ref 26.0–34.0)
MCHC: 32.3 g/dL (ref 30.0–36.0)
MCV: 97.6 fL (ref 80.0–100.0)
Platelets: 305 10*3/uL (ref 150–400)
RBC: 3.68 MIL/uL — ABNORMAL LOW (ref 3.87–5.11)
RDW: 13.5 % (ref 11.5–15.5)
WBC: 13.3 10*3/uL — ABNORMAL HIGH (ref 4.0–10.5)
nRBC: 0 % (ref 0.0–0.2)

## 2021-09-17 LAB — TROPONIN I (HIGH SENSITIVITY)
Troponin I (High Sensitivity): 8 ng/L (ref <18)
Troponin I (High Sensitivity): 11 ng/L (ref <18)

## 2021-09-17 NOTE — ED Provider Notes (Signed)
[facility] EMERGENCY DEPARTMENT Provider Note   CSN: [id number] Arrival date & time: 09/17/21  1828     History  Chief Complaint  Patient presents with   Chest Pain   Nasal Congestion    [name] is a 71 y.o. female.  The history is provided by the patient.  Chest Pain She has history of hypertension, hyperlipidemia, coronary artery disease and comes in complaining of episodes of chest pain, and skin bumps.  Chest pain episodes have been occurring over the last 4 days.  She describes a dull pain in the anterior chest without radiation and without associated dyspnea or nausea or diaphoresis.  Pains have lasted about 10 minutes and resolved completely with nitroglycerin.  She has used 3 nitroglycerin during this time.  She has also noted some bumps on her arms and legs over the last several weeks.  They will come up with and then fade away.  She denies any trauma denies any fever or chills.   Home Medications Prior to Admission medications   Medication Sig Start Date End Date Taking? Authorizing Provider  amLODipine (NORVASC) 10 MG tablet Take 1 tablet (10 mg total) by mouth daily. 04/28/21   Little Ishikawa, MD  aspirin EC 81 MG tablet Take 1 tablet (81 mg total) daily by mouth. 01/31/17   Loletta Specter, PA-C  atorvastatin (LIPITOR) 80 MG tablet Take 1 tablet (80 mg total) by mouth daily at 6 PM. Patient taking differently: Take 40 mg by mouth daily at 6 PM. 12/02/20   Little Ishikawa, MD  carvedilol (COREG) 12.5 MG tablet Take 12.5 mg by mouth 2 (two) times daily. 09/02/21   [provider]  fluconazole (DIFLUCAN) 150 MG tablet Take 150 mg by mouth once. Patient not taking: Reported on 09/14/2021 07/27/21   [provider]  fluticasone (FLONASE) 50 MCG/ACT nasal spray Place 2 sprays into both nostrils 2 (two) times daily. 02/03/21   Cresenzo, Cyndi Lennert, MD  furosemide (LASIX) 40 MG tablet Take 1 tablet (40 mg total) by mouth daily.  12/13/20 12/13/21  End, Cristal Deer, MD  gabapentin (NEURONTIN) 300 MG capsule Take 300 mg by mouth 2 (two) times daily. 11/14/19   [provider]  hydrochlorothiazide (HYDRODIURIL) 25 MG tablet Take 1 tablet (25 mg total) by mouth daily. 05/26/21 09/14/21  Little Ishikawa, MD  isosorbide mononitrate (IMDUR) 30 MG 24 hr tablet Take 1 tablet (30 mg total) by mouth daily. 09/14/21   Lennette Bihari, MD  losartan (COZAAR) 100 MG tablet Take 1 tablet (100 mg total) by mouth daily. 12/29/20   Little Ishikawa, MD  MELOXICAM PO Take 1 tablet by mouth daily in the afternoon.    [provider]  Multiple Vitamin (MULITIVITAMIN WITH MINERALS) TABS Take 1 tablet by mouth daily.    [provider]  nitroGLYCERIN (NITROSTAT) 0.4 MG SL tablet Place 1 tablet (0.4 mg total) under the tongue every 5 (five) minutes as needed. 04/28/21 09/14/21  Little Ishikawa, MD  potassium chloride SA (KLOR-CON M) 20 MEQ tablet TAKE 1 TABLET(20 MEQ) BY MOUTH DAILY 08/23/21   Celedonio Savage, MD      Allergies    Shrimp [shellfish allergy], Latex, and Penicillins    Review of Systems   Review of Systems  Cardiovascular:  Positive for chest pain.  All other systems reviewed and are negative.   Physical Exam Updated Vital Signs BP 132/64   Pulse 60   Temp 98.4 F (  36.9 C) (Oral)   Resp 18   SpO2 98%  Physical Exam Vitals and nursing note reviewed.   71 year old female, resting comfortably and in no acute distress. Vital signs are normal. Oxygen saturation is 98%, which is normal. Head is normocephalic and atraumatic. PERRLA, EOMI. Oropharynx is clear. Neck is nontender and supple without adenopathy or JVD. Back is nontender and there is no CVA tenderness. Lungs are clear without rales, wheezes, or rhonchi. Chest is nontender. Heart has regular rate and rhythm without murmur. Abdomen is soft, flat, nontender. Extremities have no cyanosis or edema.  There is a pustule on the  right lower leg with some minimal surrounding induration. Skin is warm and dry without rash. Neurologic: Mental status is normal, cranial nerves are intact, there are no motor or sensory deficits.  ED Results / Procedures / Treatments   Labs (all labs ordered are listed, but only abnormal results are displayed) Labs Reviewed  BASIC METABOLIC PANEL - Abnormal; Notable for the following components:      Result Value   Potassium 3.3 (*)    Glucose, Bld 112 (*)    Creatinine, Ser 1.18 (*)    GFR, Estimated 49 (*)    All other components within normal limits  CBC - Abnormal; Notable for the following components:   WBC 13.3 (*)    RBC 3.68 (*)    Hemoglobin 11.6 (*)    HCT 35.9 (*)    All other components within normal limits  TROPONIN I (HIGH SENSITIVITY)  TROPONIN I (HIGH SENSITIVITY)    EKG EKG Interpretation  Date/Time:  Saturday September 17 2021 18:40:43 EDT Ventricular Rate:  71 PR Interval:  152 QRS Duration: 72 QT Interval:  388 QTC Calculation: 421 R Axis:   62 Text Interpretation: Normal sinus rhythm T wave abnormality, consider inferolateral ischemia Abnormal ECG When compared with ECG of 20-Jun-2020 17:03, No significant change was found Confirmed by Dione Booze ([id number]) on 09/17/2021 10:40:28 PM  Radiology DG Chest 2 View  Result Date: 09/17/2021 CLINICAL DATA:  Chest pain. EXAM: CHEST - 2 VIEW COMPARISON:  Chest x-ray 06/19/2020 FINDINGS: The heart size and mediastinal contours are within normal limits. Both lungs are clear. The visualized skeletal structures are unremarkable. IMPRESSION: No active cardiopulmonary disease. Electronically Signed   By: Darliss Cheney M.D.   On: 09/17/2021 19:22    Procedures Procedures    Medications Ordered in ED Medications - No data to display  ED Course/ Medical Decision Making/ A&P                           Medical Decision Making Amount and/or Complexity of Data Reviewed Labs: ordered. Radiology: ordered.   Episodes of  chest pain which may represent angina.  Consider GERD, musculoskeletal pain.  Pustules may be developing abscesses.  However, since they seem to be resolving and current pustule was not at a size that I feel warrants incision and drainage, I have advised her to continue using warm compresses and return if an abscess is developing.  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   I have reviewed and interpreted her ECG, and my interpretation is nonspecific T wave abnormality which is unchanged from prior.  I have reviewed and interpreted all of her laboratory tests, and my interpretation is mild hypokalemia secondary to diuretic use, mild anemia, with slight drop in hemoglobin compared with 12/07/2020.  Mild leukocytosis is present which is nonspecific.  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  Recommendation was to maximize antianginal therapy and consider PCI of distal RCA if she fails medical management.  She is currently on isosorbide mononitrate 30 mg daily which is not maximal.  I have ordered a prescription for isosorbide mononitrate 60 mg and referred her back to her cardiologist for decision whether to proceed with PCI.  Final Clinical Impression(s) / ED Diagnoses Final diagnoses:  Nonspecific chest pain  Diuretic-induced hypokalemia  Pustules determined by examination  Normochromic normocytic anemia    Rx / DC Orders ED Discharge Orders          Ordered    isosorbide mononitrate (IMDUR) 60 MG 24 hr tablet  Daily       Note to Pharmacy: Dose change, new Rx   09/18/21 0009    potassium chloride SA (KLOR-CON M) 20 MEQ tablet  2 times daily        09/18/21 0014              Dione Booze, MD 09/18/21 0025

## 2021-09-18 ENCOUNTER — Encounter: Payer: Self-pay | Admitting: Cardiovascular Disease

## 2021-09-18 MED ORDER — ISOSORBIDE MONONITRATE ER 60 MG PO TB24: 60.0000 mg | ORAL_TABLET | Freq: Every day | ORAL | 0 refills | Status: DC

## 2021-09-18 MED ORDER — POTASSIUM CHLORIDE CRYS ER 20 MEQ PO TBCR: 20.0000 meq | EXTENDED_RELEASE_TABLET | Freq: Two times a day (BID) | ORAL | 0 refills | Status: DC

## 2021-09-18 MED ORDER — POTASSIUM CHLORIDE CRYS ER 20 MEQ PO TBCR
40.0000 meq | EXTENDED_RELEASE_TABLET | Freq: Once | ORAL | Status: AC
  Administered 2021-09-18: 40 meq via ORAL
  Filled 2021-09-18: qty 2

## 2021-09-19 ENCOUNTER — Telehealth: Payer: Self-pay | Admitting: Cardiovascular Disease

## 2021-09-19 ENCOUNTER — Encounter (HOSPITAL_COMMUNITY): Payer: Self-pay | Admitting: *Deleted

## 2021-09-19 ENCOUNTER — Ambulatory Visit (HOSPITAL_COMMUNITY)
Admission: EM | Admit: 2021-09-19 | Discharge: 2021-09-19 | Disposition: A | Discharge status: Home / Self Care | Payer: Medicare Other | Attending: Physician Assistant | Admitting: Physician Assistant

## 2021-09-19 DIAGNOSIS — Z20822 Contact with and (suspected) exposure to covid-19: Secondary | ICD-10-CM | POA: Insufficient documentation

## 2021-09-19 DIAGNOSIS — I10 Essential (primary) hypertension: Secondary | ICD-10-CM | POA: Insufficient documentation

## 2021-09-19 DIAGNOSIS — L089 Local infection of the skin and subcutaneous tissue, unspecified: Secondary | ICD-10-CM | POA: Insufficient documentation

## 2021-09-19 DIAGNOSIS — R0981 Nasal congestion: Secondary | ICD-10-CM | POA: Diagnosis not present

## 2021-09-19 MED ORDER — TRAMADOL HCL 50 MG PO TABS: 50.0000 mg | ORAL_TABLET | Freq: Two times a day (BID) | ORAL | 0 refills | Status: AC | PRN

## 2021-09-19 MED ORDER — DOXYCYCLINE HYCLATE 100 MG PO CAPS: 100.0000 mg | ORAL_CAPSULE | Freq: Two times a day (BID) | ORAL | 0 refills | Status: DC

## 2021-09-20 LAB — SARS CORONAVIRUS 2 (TAT 6-24 HRS): SARS Coronavirus 2: NEGATIVE

## 2021-10-03 LAB — GLUCOSE, POCT (MANUAL RESULT ENTRY): POC Glucose: 94 mg/dl (ref 70–99)

## 2021-10-03 NOTE — Congregational Nurse Program (Signed)
  Dept: [phone]   Congregational Nurse Program Note  Date of Encounter: 08/03/2021  Past Medical History: Past Medical History:  Diagnosis Date   Abnormal EKG 2012   hospitalized for T wave inversion in lateral leads with [facility] chest pains, normal ECHO, negative trops, no cardiology consult. recent EKG 7/15 stable T wave inversions.    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 Discussed with patient recent weight gain after meniscus tear to the left knee. Patient disclosed she once she had limited mobility she started to gain weight and it has effected her health. Patient expressed she has also had elevated blood pressure. Education was given on ways to slowly increase activity. Education was given concerning healthy eating and goal setting. Patient stated measurable goals to assist with weight loss. Patient verbalized understanding of education provided   Andrez Grime, BSN, RN, [employer]

## 2021-10-06 ENCOUNTER — Telehealth: Payer: Self-pay | Admitting: Cardiovascular Disease

## 2021-10-06 NOTE — Telephone Encounter (Signed)
Zeth with [facility] states the patient informed them that she requested an additional 30 days with sleep machine and she is requesting a status update if possible. Please advise.

## 2021-10-15 NOTE — Progress Notes (Signed)
Cardiology Office Note:    Date:  10/18/2021   ID:  [name], DOB 1950-10-13, MRN [id number]  PCP:  Jerre Simon, MD  Cardiologist:  Little Ishikawa, MD  Electrophysiologist:  None   Referring MD: Jerre Simon, MD   Chief Complaint  Patient presents with   Coronary Artery Disease    History of Present Illness:    [name] is a 71 y.o. female with a hx of hypertension, hyperlipidemia, renal infarct, ?aortic valve vegetation who presents for follow-up.  She was referred by Dr. Leveda Anna for evaluation of chest pain, initially seen 11/18/2020.  She was admitted to [facility] from 7/6 through 10/04/2015 with lower abdominal pain.  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  She was started on heparin drip.  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  Blood cultures were negative.  She was started on anticoagulation with warfarin.  Echo 10/01/2015 showed EF 65 to 70%, grade 1 diastolic dysfunction.  She completed 6 months of anticoagulation and was then transitioned to aspirin 81 mg daily.  No further episodes.  Coronary CTA on 11/26/2020 showed moderate CAD in mid and distal RCA, calcium score 936 (97th percentile).  CT FFR suggested total occlusion in mid to distal RCA and indeterminate FFR value in distal LAD that may be due to vessel tapering of small caliber distal vessel stenosis.  Cath 12/13/2020 showed chronic subtotal (99%) occlusion of distal RCA with left-to-right collaterals, mild proximal LAD disease.  Since last clinic visit, she reports that she has been doing okay.  Has been having some swelling in her ankles. Denies any chest pain, dyspnea, lightheadedness, syncope, or palpitations.    BP Readings from Last 3 Encounters:  10/18/21 (!) 156/71  09/19/21 (!) 170/79  09/18/21 (!) 153/91      Past Medical History:  Diagnosis Date   Abnormal EKG 2012   hospitalized for T wave  inversion in lateral leads with MSK chest pains, normal ECHO, negative trops, no cardiology consult. recent EKG 7/15 stable T wave inversions.    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   Arthritis 2000   Arthritis 2011   Clotting disorder (HCC)    blood clot kidney 2017   Heart murmur    Hyperlipidemia 2011   Hypertension 2011   Meniscus tear 2000   L KNEE   Renal infarct (HCC) 2017   blood clot in the kidney    Past Surgical History:  Procedure Laterality Date   ABDOMINAL SURGERY  2010   for bowel blockage   COLON SURGERY  2010   bowel blockage   COLONOSCOPY  09/2014   HYSTEROSCOPY WITH D & C N/A 05/08/2018   Procedure: DILATATION AND CURETTAGE /HYSTEROSCOPY;  Surgeon: Catalina Antigua, MD;  Location: [facility];  Service: Gynecology;  Laterality: N/A;   KNEE ARTHROSCOPY Left 09/25/2013   Procedure: LEFT KNEE ARTHROSCOPY WITH PARTIAL MEDIAL AND LATERAL  MENISCECTOMY/DEBRIDEMENT/MICRO FRACTURE MEDIAL FEMORAL CONDYLE, REMOVAL OF OSTEOCONDRAL FRAGMENT;  Surgeon: Javier Docker, MD;  Location: [facility];  Service: Orthopedics;  Laterality: Left;   KNEE SURGERY Right 1999   LEFT HEART CATH AND CORONARY ANGIOGRAPHY N/A 12/13/2020   Procedure: LEFT HEART CATH AND CORONARY ANGIOGRAPHY;  Surgeon: Yvonne Kendall, MD;  Location: [facility];  Service: Cardiovascular;  Laterality: N/A;   POLYPECTOMY     TEE WITHOUT CARDIOVERSION N/A 10/04/2015   Procedure: TRANSESOPHAGEAL ECHOCARDIOGRAM (TEE);  Surgeon: Cornelious Bryant  Turner, MD;  Location: [facility];  Service: Cardiovascular;  Laterality: N/A;   TUBAL LIGATION  1978    Current Medications: Current Meds  Medication Sig   amLODipine (NORVASC) 10 MG tablet Take 1 tablet (10 mg total) by mouth daily.   aspirin EC 81 MG tablet Take 1 tablet (81 mg total) daily by mouth.   atorvastatin (LIPITOR) 80 MG tablet Take 1 tablet (80 mg total) by mouth daily at 6 PM. (Patient taking differently: Take 40 mg by mouth daily at 6 PM.)    carvedilol (COREG) 12.5 MG tablet Take 12.5 mg by mouth 2 (two) times daily.   doxycycline (VIBRAMYCIN) 100 MG capsule Take 1 capsule (100 mg total) by mouth 2 (two) times daily.   fluticasone (FLONASE) 50 MCG/ACT nasal spray Place 2 sprays into both nostrils 2 (two) times daily.   furosemide (LASIX) 40 MG tablet Take 1 tablet (40 mg total) by mouth daily.   gabapentin (NEURONTIN) 300 MG capsule Take 300 mg by mouth 2 (two) times daily.   isosorbide mononitrate (IMDUR) 60 MG 24 hr tablet Take 1 tablet (60 mg total) by mouth daily.   losartan (COZAAR) 100 MG tablet Take 1 tablet (100 mg total) by mouth daily.   Multiple Vitamin (MULITIVITAMIN WITH MINERALS) TABS Take 1 tablet by mouth daily.   potassium chloride SA (KLOR-CON M) 20 MEQ tablet Take 1 tablet (20 mEq total) by mouth 2 (two) times daily.     Allergies:   Shrimp [shellfish allergy], Latex, and Penicillins   Social History   Socioeconomic History   Marital status: Single    Spouse name: Not on file   Number of children: 2   Years of education: some colle   Highest education level: Not on file  Occupational History   Occupation: Custodian     Employer: [employer]  Tobacco Use   Smoking status: Former    Types: Cigarettes    Quit date: 07/29/2010    Years since quitting: 11.2   Smokeless tobacco: Never  Vaping Use   Vaping Use: Never used  Substance and Sexual Activity   Alcohol use: Not Currently   Drug use: Never   Sexual activity: Not on file    Comment: 1st intercourse 71 yo-Fewer than 5 partners  Other Topics Concern   Not on file  Social History Narrative   Live with mom and son (44).   Social Determinants of Health   Financial Resource Strain: Not on file  Food Insecurity: Not on file  Transportation Needs: Not on file  Physical Activity: Not on file  Stress: Not on file  Social Connections: Not on file     Family History: The patient's family history includes Alcohol abuse in her son;  Alzheimer's disease in her father and mother; Breast cancer in her sister; Gout in her sister; Heart disease in her mother; Heart failure in her father; Hyperlipidemia in her mother; Hypertension in her father, mother, sister, and sister; Osteoporosis in her mother; Pancreatitis in her son. There is no history of Cancer, Colon cancer, Esophageal cancer, Stomach cancer, or Rectal cancer.  ROS:   Please see the history of present illness.     All other systems reviewed and are negative.  EKGs/Labs/Other Studies Reviewed:    The following studies were reviewed today:   EKG:  EKG is  ordered today.  The ekg ordered today demonstrates normal sinus rhythm, rate 68, T wave inversions in leads I, aVL, V3-6   Recent  Labs: 06/14/2021: Magnesium 2.0 09/17/2021: BUN 23; Creatinine, Ser 1.18; Hemoglobin 11.6; Platelets 305; Potassium 3.3; Sodium 139  Recent Lipid Panel    Component Value Date/Time   CHOL 204 (H) 06/14/2021 1426   TRIG 158 (H) 06/14/2021 1426   HDL 53 06/14/2021 1426   CHOLHDL 3.8 06/14/2021 1426   CHOLHDL 4.7 10/01/2015 0732   VLDL 27 10/01/2015 0732   LDLCALC 123 (H) 06/14/2021 1426    Physical Exam:    VS:  BP (!) 156/71   Pulse 70   Ht 5\' 5"  (1.651 m)   Wt 201 lb (91.2 kg)   SpO2 93%   BMI 33.45 kg/m     Wt Readings from Last 3 Encounters:  10/18/21 201 lb (91.2 kg)  09/14/21 198 lb 12.8 oz (90.2 kg)  08/10/21 198 lb (89.8 kg)     GEN:  Well nourished, well developed in no acute distress HEENT: Normal NECK: No JVD; No carotid bruits LYMPHATICS: No lymphadenopathy CARDIAC: RRR,2/6 systolic murmur RESPIRATORY:  Clear to auscultation without rales, wheezing or rhonchi  ABDOMEN: Soft, non-tender, non-distended MUSCULOSKELETAL:  Trace edema; No deformity  SKIN: Warm and dry NEUROLOGIC:  Alert and oriented x 3 PSYCHIATRIC:  Normal affect   ASSESSMENT:    1. CAD in native artery   2. Chronic diastolic heart failure (HCC)   3. Renal infarct (HCC)   4.  Hypertension, unspecified type   5. Atrial fibrillation, unspecified type (HCC)      PLAN:    CAD: Reports atypical chest pain, no clear relationship with exertion but does appear to be correlated to stress.  Coronary CTA on 11/26/2020 showed moderate CAD in mid and distal RCA, calcium score 936 (97th percentile).  CT FFR suggested total occlusion in mid to distal RCA and indeterminate FFR value in distal LAD that may be due to vessel tapering of small caliber distal vessel stenosis.  Cath 12/13/2020 showed chronic subtotal (99%) occlusion of distal RCA with left-to-right collaterals, mild proximal LAD disease.  Echocardiogram 12/10/2020 showed normal biventricular function, no significant valvular disease. -Continue aspirin 81 mg daily -Continue atorvastatin 80 mg daily -Continue carvedilol to 25 mg twice daily -Continue Imdur 50 mg daily -As needed SL NTG -If refractory angina despite optimized antianginal regimen, could consider PCI to distal RCA.  Currently denies any anginal symptoms  Chronic diastolic heart failure: LVEDP 91-47 on cath 12/13/2020.  Started on Lasix 40 mg daily. -Check BMP, mag  Heart murmur: Echocardiogram 12/10/2020 showed normal biventricular function, no significant valvular disease.  Renal infarct: Presented with renal infarct in 2017.  TEE showed possible aortic valve vegetation versus Lambls excrescence.  Blood cultures negative.  Completed 6 months of warfarin.  Has not had recurrent evidence of embolic events.  Echocardiogram 12/10/2020 unremarkable. -Zio patch x2 weeks to evaluate for A. Fib.  This was previously ordered but has not been done.  We will schedule appointment for her to come in and have monitor placed  Hypertension: Per med list she is to be on carvedilol 12,5 mg twice daily, Lasix 40 mg daily, losartan 100 mg daily, amlodipine 10 mg daily, Imdur 60 mg daily.  She does not know what she is taking, thinks she is also taking HCTZ.  Recommend scheduling in  pharmacy clinic and bring all her medications with her so we can verify what she is taking and adjust her regimen accordingly  Hyperlipidemia: On atorvastatin 40 mg daily,  LDL 109 on 10/14/2020.  Atorvastatin increased to 80 mg daily.  LDL 123 on 06/14/2021, suspect noncompliance  OSA: Follows with Dr. Tresa Endo.  Was started on CPAP but unfortunately was noncompliant and her machine was taken back by her DME company 08/2021   RTC in 3 months   Medication Adjustments/Labs and Tests Ordered: Current medicines are reviewed at length with the patient today.  Concerns regarding medicines are outlined above.  Orders Placed This Encounter  Procedures   Basic metabolic panel   Magnesium   LONG TERM MONITOR (3-14 DAYS)      No orders of the defined types were placed in this encounter.      Patient Instructions  Medication Instructions:  Your physician recommends that you continue on your current medications as directed. Please refer to the Current Medication list given to you today.  *If you need a refill on your cardiac medications before your next appointment, please call your pharmacy*   Lab Work: BMET, Mag today  If you have labs (blood work) drawn today and your tests are completely normal, you will receive your results only by: MyChart Message (if you have MyChart) OR A paper copy in the mail If you have any lab test that is abnormal or we need to change your treatment, we will call you to review the results.   Testing/Procedures: Christena Deem- Long Term Monitor Instructions   Your physician has requested you wear a ZIO patch monitor for _14_ days.  This is a single patch monitor.   [employer] supplies one patch monitor per enrollment. Additional stickers are not available. Please do not apply patch if you will be having a Nuclear Stress Test, Echocardiogram, Cardiac CT, MRI, or Chest Xray during the period you would be wearing the monitor. The patch cannot be worn during these tests.  You cannot remove and re-apply the ZIO XT patch monitor.  Your ZIO patch monitor will be sent Fed Ex from [employer] directly to your home address. It may take 3-5 days to receive your monitor after you have been enrolled.  Once you have received your monitor, please review the enclosed instructions. Your monitor has already been registered assigning a specific monitor serial # to you.  Billing and Patient Assistance Program Information   We have supplied [employer] with any of your insurance information on file for billing purposes. [employer] offers a sliding scale Patient Assistance Program for patients that do not have insurance, or whose insurance does not completely cover the cost of the ZIO monitor.   You must apply for the Patient Assistance Program to qualify for this discounted rate.     To apply, please call [employer] at [phone], select option 4, then select option 2, and ask to apply for Patient Assistance Program.  Meredeth Ide will ask your household income, and how many people are in your household.  They will quote your out-of-pocket cost based on that information.  [employer] will also be able to set up a 38-month, interest-free payment plan if needed.  Applying the monitor   Shave hair from upper left chest.  Hold abrader disc by orange tab. Rub abrader in 40 strokes over the upper left chest as indicated in your monitor instructions.  Clean area with 4 enclosed alcohol pads. Let dry.  Apply patch as indicated in monitor instructions. Patch will be placed under collarbone on left side of chest with arrow pointing upward.  Rub patch adhesive wings for 2 minutes. Remove white label marked "1". Remove the white label marked "2". Rub patch adhesive wings for 2 additional  minutes.  While looking in a mirror, press and release button in center of patch. A small green light will flash 3-4 times. This will be your only indicator that the monitor has been turned on. ?  Do not shower for the  first 24 hours. You may shower after the first 24 hours.  Press the button if you feel a symptom. You will hear a small click. Record Date, Time and Symptom in the Patient Logbook.  When you are ready to remove the patch, follow instructions on the last 2 pages of the Patient Logbook. Stick patch monitor onto the last page of Patient Logbook.  Place Patient Logbook in the blue and white box.  Use locking tab on box and tape box closed securely.  The blue and white box has prepaid postage on it. Please place it in the mailbox as soon as possible. Your physician should have your test results approximately 7 days after the monitor has been mailed back to [facility].  Call [facility] Customer Care at [phone] if you have questions regarding your ZIO XT patch monitor. Call them immediately if you see an orange light blinking on your monitor.  If your monitor falls off in less than 4 days, contact our Monitor department at [phone]. ?If your monitor becomes loose or falls off after 4 days call [employer] at [phone] for suggestions on securing your monitor.?  Follow-Up: At [facility], you and your health needs are our priority.  As part of our continuing mission to provide you with exceptional heart care, we have created designated Provider Care Teams.  These Care Teams include your primary Cardiologist (physician) and Advanced Practice Providers (APPs -  Physician Assistants and Nurse Practitioners) who all work together to provide you with the care you need, when you need it.  We recommend signing up for the patient portal called "MyChart".  Sign up information is provided on this After Visit Summary.  MyChart is used to connect with patients for Virtual Visits (Telemedicine).  Patients are able to view lab/test results, encounter notes, upcoming appointments, etc.  Non-urgent messages can be sent to your provider as well.   To learn more about what you can do with MyChart, go to  [email or web address].    Your next appointment:   Next opening with pharmacist-[facility] ALL MEDICATIONS 3 months with Dr. Bjorn Pippin Schedule appointment to have Zio Monitor placed  Important Information About Sugar         Signed, Little Ishikawa, MD  10/18/2021 5:55 PM    [facility]

## 2021-10-18 ENCOUNTER — Ambulatory Visit (INDEPENDENT_AMBULATORY_CARE_PROVIDER_SITE_OTHER): Payer: Medicare Other | Admitting: Cardiology

## 2021-10-18 ENCOUNTER — Ambulatory Visit (INDEPENDENT_AMBULATORY_CARE_PROVIDER_SITE_OTHER): Payer: Medicare Other

## 2021-10-18 ENCOUNTER — Encounter: Payer: Self-pay | Admitting: Cardiology

## 2021-10-18 ENCOUNTER — Telehealth: Payer: Self-pay | Admitting: *Deleted

## 2021-10-18 VITALS — BP 156/71 | HR 70 | Ht 65.0 in | Wt 201.0 lb

## 2021-10-18 DIAGNOSIS — I5032 Chronic diastolic (congestive) heart failure: Secondary | ICD-10-CM | POA: Diagnosis not present

## 2021-10-18 DIAGNOSIS — I4891 Unspecified atrial fibrillation: Secondary | ICD-10-CM | POA: Diagnosis not present

## 2021-10-18 DIAGNOSIS — N280 Ischemia and infarction of kidney: Secondary | ICD-10-CM

## 2021-10-18 DIAGNOSIS — I10 Essential (primary) hypertension: Secondary | ICD-10-CM | POA: Diagnosis not present

## 2021-10-18 DIAGNOSIS — I2510 Atherosclerotic heart disease of native coronary artery without angina pectoris: Secondary | ICD-10-CM

## 2021-10-18 NOTE — Progress Notes (Unsigned)
Enrolled for [employer] to mail a ZIO XT long term holter monitor to the patients address on file.   ZIO XT serial # [id number] mailed to patient 10/19/21, applied in office using tincture of benzoin.

## 2021-10-18 NOTE — Telephone Encounter (Signed)
Patient scheduled to come to our [facility] office Tuesday, 10/25/21, at 12:00PM to have 14 day ZIO XT mailed to home 7/25 applied.

## 2021-10-18 NOTE — Patient Instructions (Addendum)
Medication Instructions:  Your physician recommends that you continue on your current medications as directed. Please refer to the Current Medication list given to you today.  *If you need a refill on your cardiac medications before your next appointment, please call your pharmacy*   Lab Work: BMET, [place] today  If you have labs (blood work) drawn today and your tests are completely normal, you will receive your results only by: [place] (if you have MyChart) OR A paper copy in the mail If you have any lab test that is abnormal or we need to change your treatment, we will call you to review the results.   Testing/Procedures: Bryn Gulling- Long Term Monitor Instructions   Your physician has requested you wear a ZIO patch monitor for _14_ days.  This is a single patch monitor.   [employer] supplies one patch monitor per enrollment. Additional stickers are not available. Please do not apply patch if you will be having a Nuclear Stress Test, Echocardiogram, Cardiac CT, MRI, or Chest Xray during the period you would be wearing the monitor. The patch cannot be worn during these tests. You cannot remove and re-apply the ZIO XT patch monitor.  Your ZIO patch monitor will be sent Fed Ex from [employer] directly to your home address. It may take 3-5 days to receive your monitor after you have been enrolled.  Once you have received your monitor, please review the enclosed instructions. Your monitor has already been registered assigning a specific monitor serial # to you.  Billing and Patient Assistance Program Information   We have supplied [employer] with any of your insurance information on file for billing purposes. [employer] offers a sliding scale Patient Assistance Program for patients that do not have insurance, or whose insurance does not completely cover the cost of the ZIO monitor.   You must apply for the Patient Assistance Program to qualify for this discounted rate.     To apply, please  call [employer] at [phone], select option 4, then select option 2, and ask to apply for Patient Assistance Program.  Theodore Demark will ask your household income, and how many people are in your household.  They will quote your out-of-pocket cost based on that information.  [employer] will also be able to set up a 49-month interest-free payment plan if needed.  Applying the monitor   Shave hair from upper left chest.  Hold abrader disc by orange tab. Rub abrader in 40 strokes over the upper left chest as indicated in your monitor instructions.  Clean area with 4 enclosed alcohol pads. Let dry.  Apply patch as indicated in monitor instructions. Patch will be placed under collarbone on left side of chest with arrow pointing upward.  Rub patch adhesive wings for 2 minutes. Remove white label marked "1". Remove the white label marked "2". Rub patch adhesive wings for 2 additional minutes.  While looking in a mirror, press and release button in center of patch. A small green light will flash 3-4 times. This will be your only indicator that the monitor has been turned on. ?  Do not shower for the first 24 hours. You may shower after the first 24 hours.  Press the button if you feel a symptom. You will hear a small click. Record Date, Time and Symptom in the Patient Logbook.  When you are ready to remove the patch, follow instructions on the last 2 pages of the Patient Logbook. Stick patch monitor onto the last page of Patient Logbook.  Place Patient Logbook in the blue and white box.  Use locking tab on box and tape box closed securely.  The blue and white box has prepaid postage on it. Please place it in the mailbox as soon as possible. Your physician should have your test results approximately 7 days after the monitor has been mailed back to [facility].  Call [facility] at [phone] if you have questions regarding your ZIO XT patch monitor. Call them immediately if you see an orange  light blinking on your monitor.  If your monitor falls off in less than 4 days, contact our Monitor department at [phone]. ?If your monitor becomes loose or falls off after 4 days call [employer] at [phone] for suggestions on securing your monitor.?  Follow-Up: At [facility], you and your health needs are our priority.  As part of our continuing mission to provide you with exceptional heart care, we have created designated Provider Care Teams.  These Care Teams include your primary Cardiologist (physician) and Advanced Practice Providers (APPs -  Physician Assistants and Nurse Practitioners) who all work together to provide you with the care you need, when you need it.  We recommend signing up for the patient portal called "MyChart".  Sign up information is provided on this After Visit Summary.  MyChart is used to connect with patients for Virtual Visits (Telemedicine).  Patients are able to view lab/test results, encounter notes, upcoming appointments, etc.  Non-urgent messages can be sent to your provider as well.   To learn more about what you can do with MyChart, go to [email or web address].    Your next appointment:   Next opening with pharmacist-BRING ALL MEDICATIONS 3 months with Dr. Gardiner Rhyme Schedule appointment to have Zio Monitor placed  Important Information About Sugar

## 2021-10-19 LAB — BASIC METABOLIC PANEL
BUN/Creatinine Ratio: 22 (ref 12–28)
BUN: 25 mg/dL (ref 8–27)
CO2: 24 mmol/L (ref 20–29)
Calcium: 9.4 mg/dL (ref 8.7–10.3)
Chloride: 103 mmol/L (ref 96–106)
Creatinine, Ser: 1.15 mg/dL — ABNORMAL HIGH (ref 0.57–1.00)
Glucose: 78 mg/dL (ref 70–99)
Potassium: 4.1 mmol/L (ref 3.5–5.2)
Sodium: 141 mmol/L (ref 134–144)
eGFR: 51 mL/min/{1.73_m2} — ABNORMAL LOW (ref >59)

## 2021-10-19 LAB — MAGNESIUM: Magnesium: 2 mg/dL (ref 1.6–2.3)

## 2021-10-20 ENCOUNTER — Encounter (HOSPITAL_COMMUNITY): Payer: Self-pay

## 2021-10-20 ENCOUNTER — Ambulatory Visit (HOSPITAL_COMMUNITY)
Admission: EM | Admit: 2021-10-20 | Discharge: 2021-10-20 | Disposition: A | Discharge status: Home / Self Care | Payer: Medicare Other | Attending: Family Medicine | Admitting: Family Medicine

## 2021-10-20 DIAGNOSIS — B349 Viral infection, unspecified: Secondary | ICD-10-CM | POA: Diagnosis not present

## 2021-10-20 DIAGNOSIS — K529 Noninfective gastroenteritis and colitis, unspecified: Secondary | ICD-10-CM

## 2021-10-20 DIAGNOSIS — Z20822 Contact with and (suspected) exposure to covid-19: Secondary | ICD-10-CM | POA: Diagnosis not present

## 2021-10-20 MED ORDER — ONDANSETRON 4 MG PO TBDP: 4.0000 mg | ORAL_TABLET | Freq: Three times a day (TID) | ORAL | 0 refills | Status: DC | PRN

## 2021-10-20 NOTE — Discharge Instructions (Addendum)
Ondansetron dissolved in the mouth every 8 hours as needed for nausea or vomiting. Clear liquids and bland things to eat.   You have been swabbed for COVID, and the test will result in the next 24 hours. Our staff will call you if positive. If the test is positive, you should quarantine for 5 days.

## 2021-10-20 NOTE — ED Triage Notes (Signed)
Pt states that she began having body aches, nausea and headache that started yesterday.

## 2021-10-20 NOTE — ED Provider Notes (Addendum)
[place]    CSN: [id number] Arrival date & time: 10/20/21  1816      History   Chief Complaint Chief Complaint  Patient presents with   Headache   Generalized Body Aches    HPI [name] is a 71 y.o. female.    Headache  Here for myalgia, nausea, and vomiting.  She has not had diarrhea.  She has had stool output today and yesterday.  Symptoms began yesterday  She is thrown up about 3 times in the last 24 to 36 hours  She has had a little bit of cough and a little bit of congestion.  Her left ear does hurt a little bit.  No fever or chills  Past Medical History:  Diagnosis Date   Abnormal EKG 2012   hospitalized for T wave inversion in lateral leads with MSK chest pains, normal ECHO, negative trops, no cardiology consult. recent EKG 7/15 stable T wave inversions.    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   blood clot in the kidney    Patient Active Problem List   Diagnosis Date Noted   Daytime somnolence 01/12/2021   Atypical chest pain 12/13/2020   Abnormal cardiac CT angiography 12/13/2020   Left elbow pain 03/31/2020   Contact dermatitis 01/14/2019   Vaginal itching 12/05/2018   Pelvic pain 10/07/2018   Leg edema 08/28/2018   Thickened endometrium    Umbilical hernia without obstruction and without gangrene 01/01/2018   Osteopenia 09/05/2017   Closed nondisplaced spiral fracture of shaft of right tibia 03/28/2017   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   HLD (hyperlipidemia)    Aortic atherosclerosis ([place])    Back pain 07/08/2015   Scotoma of blind spot area in visual field 06/14/2015   Bilateral leg pain 06/14/2015   Seasonal allergies 03/09/2015   Hypopigmentation 03/09/2015   Osteoarthritis of left  knee 07/08/2014   DJD (degenerative joint disease), lumbar 07/08/2014   Osteoarthritis of right knee 07/08/2014   Urinary frequency 06/11/2014   Healthcare maintenance 06/11/2014   Allergic rhinoconjunctivitis of both eyes 06/11/2014   Nearsightedness 04/27/2014   Abnormal EKG    Hypertension 12/11/2013    Past Surgical History:  Procedure Laterality Date   ABDOMINAL SURGERY  2010   for bowel blockage   COLON SURGERY  2010   bowel blockage   COLONOSCOPY  09/2014   HYSTEROSCOPY WITH D & C N/A 05/08/2018   Procedure: DILATATION AND CURETTAGE /HYSTEROSCOPY;  Surgeon: Mora Bellman, MD;  Location: [facility];  Service: Gynecology;  Laterality: N/A;   KNEE ARTHROSCOPY Left 09/25/2013   Procedure: LEFT KNEE ARTHROSCOPY WITH PARTIAL MEDIAL AND LATERAL  MENISCECTOMY/DEBRIDEMENT/MICRO FRACTURE MEDIAL FEMORAL CONDYLE, REMOVAL OF OSTEOCONDRAL FRAGMENT;  Surgeon: Johnn Hai, MD;  Location: [facility];  Service: Orthopedics;  Laterality: Left;   KNEE SURGERY Right 1999   LEFT HEART CATH AND CORONARY ANGIOGRAPHY N/A 12/13/2020   Procedure: LEFT HEART CATH AND CORONARY ANGIOGRAPHY;  Surgeon: Nelva Bush, MD;  Location: [facility];  Service: Cardiovascular;  Laterality: N/A;   POLYPECTOMY     TEE WITHOUT CARDIOVERSION N/A 10/04/2015   Procedure: TRANSESOPHAGEAL ECHOCARDIOGRAM (TEE);  Surgeon: Sueanne Margarita, MD;  [facility];  Service: Cardiovascular;  Laterality: N/A;   TUBAL LIGATION  1978    OB History     Gravida  2   Para  2   Term      Preterm      AB      Living  2      SAB      IAB      Ectopic      Multiple      Live Births               Home Medications    Prior to Admission medications   Medication Sig Start Date End Date Taking? Authorizing Provider  amLODipine (NORVASC) 10 MG tablet Take 1 tablet (10 mg total) by mouth daily. 04/28/21  Yes Donato Heinz, MD  aspirin EC 81 MG tablet Take 1 tablet (81 mg  total) daily by mouth. 01/31/17  Yes Clent Demark, PA-C  atorvastatin (LIPITOR) 80 MG tablet Take 1 tablet (80 mg total) by mouth daily at 6 PM. Patient taking differently: Take 40 mg by mouth daily at 6 PM. 12/02/20  Yes Donato Heinz, MD  carvedilol (COREG) 12.5 MG tablet Take 12.5 mg by mouth 2 (two) times daily. 09/02/21  Yes [provider]  fluticasone (FLONASE) 50 MCG/ACT nasal spray Place 2 sprays into both nostrils 2 (two) times daily. 02/03/21  Yes Cresenzo, Angelyn Punt, MD  furosemide (LASIX) 40 MG tablet Take 1 tablet (40 mg total) by mouth daily. 12/13/20 12/13/21 Yes End, Harrell Gave, MD  gabapentin (NEURONTIN) 300 MG capsule Take 300 mg by mouth 2 (two) times daily. 11/14/19  Yes [provider]  isosorbide mononitrate (IMDUR) 60 MG 24 hr tablet Take 1 tablet (60 mg total) by mouth daily. 3/50/09  Yes Delora Fuel, MD  losartan (COZAAR) 100 MG tablet Take 1 tablet (100 mg total) by mouth daily. 12/29/20  Yes Donato Heinz, MD  Multiple Vitamin (MULITIVITAMIN WITH MINERALS) TABS Take 1 tablet by mouth daily.   Yes [provider]  ondansetron (ZOFRAN-ODT) 4 MG disintegrating tablet Take 1 tablet (4 mg total) by mouth every 8 (eight) hours as needed for nausea or vomiting. 10/20/21  Yes Janene Yousuf, Gwenlyn Perking, MD  potassium chloride SA (KLOR-CON M) 20 MEQ tablet Take 1 tablet (20 mEq total) by mouth 2 (two) times daily. 3/81/82  Yes Delora Fuel, MD  nitroGLYCERIN (NITROSTAT) 0.4 MG SL tablet Place 1 tablet (0.4 mg total) under the tongue every 5 (five) minutes as needed. 04/28/21 09/14/21  Donato Heinz, MD    Family History Family History  Problem Relation Age of Onset   Hypertension Mother    Heart disease Mother        has a pacemaker    Alzheimer's disease Mother    Hyperlipidemia Mother    Osteoporosis Mother    Heart failure Father    Hypertension Father    Alzheimer's disease Father    Hypertension Sister    Breast cancer  Sister    Hypertension Sister    Gout Sister    Alcohol abuse Son    Pancreatitis Son    Cancer Neg Hx    Colon cancer Neg Hx    Esophageal cancer Neg Hx    Stomach cancer Neg Hx    Rectal cancer Neg Hx     Social History Social History   Tobacco Use   Smoking status: Former    Types: Cigarettes  Quit date: 07/29/2010    Years since quitting: 11.2   Smokeless tobacco: Never  Vaping Use   Vaping Use: Never used  Substance Use Topics   Alcohol use: Not Currently   Drug use: Never     Allergies   Shrimp [shellfish allergy], Latex, and Penicillins   Review of Systems Review of Systems  Neurological:  Positive for headaches.     Physical Exam Triage Vital Signs ED Triage Vitals  Enc Vitals Group     BP 10/20/21 1841 (!) 182/74     Pulse Rate 10/20/21 1841 64     Resp 10/20/21 1841 20     Temp 10/20/21 1841 98.3 F (36.8 C)     Temp src --      SpO2 10/20/21 1841 95 %     Weight --      Height --      Head Circumference --      Peak Flow --      Pain Score 10/20/21 1843 8     Pain Loc --      Pain Edu? --      Excl. in [place]? --    No data found.  Updated Vital Signs BP (!) 182/74   Pulse 64   Temp 98.3 F (36.8 C)   Resp 20   SpO2 95%   Visual Acuity Right Eye Distance:   Left Eye Distance:   Bilateral Distance:    Right Eye Near:   Left Eye Near:    Bilateral Near:     Physical Exam Vitals reviewed.  Constitutional:      General: She is not in acute distress.    Appearance: She is not ill-appearing, toxic-appearing or diaphoretic.  HENT:     Nose: Nose normal.     Mouth/Throat:     Mouth: Mucous membranes are moist.     Pharynx: No oropharyngeal exudate or posterior oropharyngeal erythema.  Eyes:     Extraocular Movements: Extraocular movements intact.     Conjunctiva/sclera: Conjunctivae normal.     Pupils: Pupils are equal, round, and reactive to light.  Cardiovascular:     Rate and Rhythm: Normal rate and regular rhythm.      Heart sounds: No murmur heard. Pulmonary:     Breath sounds: Normal breath sounds.  Abdominal:     Palpations: Abdomen is soft. There is no mass.     Tenderness: There is abdominal tenderness (Generalized, mild).  Musculoskeletal:     Cervical back: Neck supple.  Lymphadenopathy:     Cervical: No cervical adenopathy.  Skin:    Coloration: Skin is not jaundiced or pale.  Neurological:     General: No focal deficit present.     Mental Status: She is alert and oriented to person, place, and time.  Psychiatric:        Behavior: Behavior normal.      UC Treatments / Results  Labs (all labs ordered are listed, but only abnormal results are displayed) Labs Reviewed  SARS CORONAVIRUS 2 (TAT 6-24 HRS)    EKG   Radiology No results found.  Procedures Procedures (including critical care time)  Medications Ordered in UC Medications - No data to display  Initial Impression / Assessment and Plan / UC Course  I have reviewed the triage vital signs and the nursing notes.  Pertinent labs & imaging results that were available during my care of the patient were reviewed by me and considered in my medical decision making (see  chart for details).     I suspect this is a viral gastroenteritis, but we will swab for COVID to since she has some congestion symptoms.  If she is positive she is a candidate for molnupiravir.  Zofran for the nausea and she will drink clear liquids Final Clinical Impressions(s) / UC Diagnoses   Final diagnoses:  Viral illness  Gastroenteritis     Discharge Instructions      Ondansetron dissolved in the mouth every 8 hours as needed for nausea or vomiting. Clear liquids and bland things to eat.   You have been swabbed for COVID, and the test will result in the next 24 hours. Our staff will call you if positive. If the test is positive, you should quarantine for 5 days.       ED Prescriptions     Medication Sig Dispense Auth. Provider    ondansetron (ZOFRAN-ODT) 4 MG disintegrating tablet Take 1 tablet (4 mg total) by mouth every 8 (eight) hours as needed for nausea or vomiting. 10 tablet Windy Carina Gwenlyn Perking, MD      I have reviewed the PDMP during this encounter.   Barrett Henle, MD 10/20/21 1914    Barrett Henle, MD 10/20/21 [id number]

## 2021-10-21 LAB — SARS CORONAVIRUS 2 (TAT 6-24 HRS): SARS Coronavirus 2: NEGATIVE

## 2021-10-25 DIAGNOSIS — I4891 Unspecified atrial fibrillation: Secondary | ICD-10-CM | POA: Diagnosis not present

## 2021-10-27 ENCOUNTER — Ambulatory Visit (INDEPENDENT_AMBULATORY_CARE_PROVIDER_SITE_OTHER): Payer: Medicare Other | Admitting: Student

## 2021-10-27 ENCOUNTER — Encounter: Payer: Self-pay | Admitting: Student

## 2021-10-27 VITALS — BP 122/58 | HR 68 | Ht 65.0 in | Wt 202.0 lb

## 2021-10-27 DIAGNOSIS — M1712 Unilateral primary osteoarthritis, left knee: Secondary | ICD-10-CM

## 2021-10-27 DIAGNOSIS — I10 Essential (primary) hypertension: Secondary | ICD-10-CM

## 2021-10-27 DIAGNOSIS — Z0000 Encounter for general adult medical examination without abnormal findings: Secondary | ICD-10-CM | POA: Diagnosis not present

## 2021-10-27 MED ORDER — DICLOFENAC SODIUM 1 % EX GEL: 2.0000 g | Freq: Four times a day (QID) | CUTANEOUS | 1 refills | Status: DC

## 2021-10-27 MED ORDER — LIDOCAINE 4 % EX PTCH: 1.0000 | MEDICATED_PATCH | CUTANEOUS | 0 refills | Status: DC

## 2021-10-27 NOTE — Assessment & Plan Note (Signed)
Patient endorses bilateral knee pain that has made ambulation more difficult and requiring cane to walk. -Commend patient into changes Voltaren gel or lidocaine patch -Advised use of Tylenol for worsening pain -Avoid NSAID (ibuprofen, tramadol,aleve) -Completed the disability form for the handicap placard

## 2021-10-27 NOTE — Assessment & Plan Note (Signed)
BP today was 122/58.  Denies any lightheadedness, shortness of breath or chest pain.  Patient was recently started on amlodipine 2 months ago.  Endorses bilateral lower extremity edema which started 2 weeks ago.  Her edema is likely in the setting of amlodipine. -Recommend reducing amlodipine dose to 5 mg daily -Keep daily BP diary -Follow up in 4 week to monitor BP and LE edema.

## 2021-10-27 NOTE — Progress Notes (Signed)
    SUBJECTIVE:   Chief compliant/HPI: annual examination  [name] is a 71 y.o. who presents today for an annual exam.   PMH is significant for HTN, aortic atherosclerosis, bilateral knee osteoarthritis.  Patient report she's doing well overall other than leg swelling and knee pain. Patient recently started noticing bilateral lower extremity swelling up to her ankles about 2 weeks ago.  She denies any calf pain but reports worsening pain of arthritis in both knees.  Patient walks with a cane and reports difficulty with ambulation due to her knee pain.   Of note patient reported she was recently started on Amlodipine for blood pressure control about 2 months ago and Ezetimibe elevated lipids.  Smoked 2 packs a day for about 20 years but discontinued 10 days ago.   Reviewed and updated history .   Review of systems form notable for bilateral knee and back pain   OBJECTIVE:   BP (!) 122/58   Pulse 68   Ht '5\' 5"'$  (1.651 m)   Wt 202 lb (91.6 kg)   SpO2 99%   BMI 33.61 kg/m   BP 122/58  General: Alert, well appearing, NAD HEENT: Atraumatic, MMM, No sclera icterus CV: RRR, no murmurs, normal S1/S2 Pulm: CTAB, good WOB on RA, no crackles or wheezing Abd: Soft, no distension, no tenderness Skin: dry, warm Ext: +1 BLE edema up to the lower shin, +2 Pedal and radial pulse.  ASSESSMENT/PLAN:   Hypertension BP today was 122/58.  Denies any lightheadedness, shortness of breath or chest pain.  Patient was recently started on amlodipine 2 months ago.  Endorses bilateral lower extremity edema which started 2 weeks ago.  Her edema is likely in the setting of amlodipine. -Recommend reducing amlodipine dose to 5 mg daily -Keep daily BP diary -Follow up in 4 week to monitor BP and LE edema.  Osteoarthritis of left knee Patient endorses bilateral knee pain that has made ambulation more difficult and requiring cane to walk. -Commend patient into changes Voltaren gel or lidocaine  patch -Advised use of Tylenol for worsening pain -Avoid NSAID (ibuprofen, tramadol,aleve) -Completed the disability form for the handicap placard   Patient declined shingles vaccine at this time.  Alen Bleacher, MD [place]

## 2021-10-27 NOTE — Patient Instructions (Addendum)
It was wonderful to meet you today. Thank you for allowing me to be a part of your care. Below is a short summary of what we discussed at your visit today:  Swellings in your legs are likely due to recent start in amlodipine. I recommend using your dose from 10 mg every day to 5 mg.  Blood pressure was slightly low today.  Please keep a blood pressure diary and we can follow-up in 4 weeks to check your blood pressure and leg swelling.  Completed the handicap placard form which you should take to the [facility].  Ordered Voltaren gel and lidocaine patch for your knees which you can use interchangeably. Avoid ibuprofen, or Aleve.  You can use Tylenol for pain.  If you have any questions or concerns, please do not hesitate to contact us via phone or MyChart message.   Alen Bleacher, MD [facility]

## 2021-10-28 ENCOUNTER — Telehealth: Payer: Self-pay | Admitting: Student

## 2021-10-28 NOTE — Telephone Encounter (Signed)
Called patient to schedule AWV, LVM if patient calls back please assist in scheduling or see me.   Thanks!

## 2021-11-01 ENCOUNTER — Ambulatory Visit (INDEPENDENT_AMBULATORY_CARE_PROVIDER_SITE_OTHER): Payer: Medicare Other

## 2021-11-01 VITALS — Ht 65.0 in | Wt 200.0 lb

## 2021-11-01 DIAGNOSIS — Z0000 Encounter for general adult medical examination without abnormal findings: Secondary | ICD-10-CM

## 2021-11-01 NOTE — Progress Notes (Unsigned)
Subjective:   [name] is a 71 y.o. female who presents for Medicare Annual (Subsequent) preventive examination.  Patient consented to have virtual visit and was identified by name and date of birth. Method of visit: {TELEPHONE VS CZYSA:63016}  Encounter participants: Patient: [name] - located at *** Nurse/Provider: Dorna Bloom - located at *** Others (if applicable): ***    Review of Systems:  ***       Objective:     Vitals: Ht '5\' 5"'$  (1.651 m)   Wt 200 lb (90.7 kg)   BMI 33.28 kg/m   Body mass index is 33.28 kg/m.     05/04/2021   11:09 AM 01/12/2021    9:00 PM 12/13/2020    7:55 AM 06/20/2020    5:10 PM 06/19/2020    9:25 PM 03/31/2020    2:49 PM 05/25/2019    7:44 PM  Advanced Directives  Does Patient Have a Medical Advance Directive? No No No No No No No  Would patient like information on creating a medical advance directive? No - Patient declined No - Patient declined No - Patient declined No - Patient declined  No - Patient declined No - Patient declined    Tobacco Social History   Tobacco Use  Smoking Status Former   Types: Cigarettes   Quit date: 07/29/2010   Years since quitting: 11.2   Passive exposure: Past  Smokeless Tobacco Never     Counseling given: Not Answered   Clinical Intake:     Pain Score: 6                  Past Medical History:  Diagnosis Date   Abnormal EKG 2012   hospitalized for T wave inversion in lateral leads with MSK chest pains, normal ECHO, negative trops, no cardiology consult. recent EKG 7/15 stable T wave inversions.    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   blood clot in the kidney   Past Surgical History:  Procedure Laterality Date   ABDOMINAL SURGERY  2010   for bowel blockage    COLON SURGERY  2010   bowel blockage   COLONOSCOPY  09/2014   HYSTEROSCOPY WITH D & C N/A 05/08/2018   Procedure: DILATATION AND CURETTAGE /HYSTEROSCOPY;  Surgeon: Mora Bellman, MD;  Location: [facility];  Service: Gynecology;  Laterality: N/A;   KNEE ARTHROSCOPY Left 09/25/2013   Procedure: LEFT KNEE ARTHROSCOPY WITH PARTIAL MEDIAL AND LATERAL  MENISCECTOMY/DEBRIDEMENT/MICRO FRACTURE MEDIAL FEMORAL CONDYLE, REMOVAL OF OSTEOCONDRAL FRAGMENT;  Surgeon: Johnn Hai, MD;  Location: [facility];  Service: Orthopedics;  Laterality: Left;   KNEE SURGERY Right 1999   LEFT HEART CATH AND CORONARY ANGIOGRAPHY N/A 12/13/2020   Procedure: LEFT HEART CATH AND CORONARY ANGIOGRAPHY;  Surgeon: Nelva Bush, MD;  Location: [facility];  Service: Cardiovascular;  Laterality: N/A;   POLYPECTOMY     TEE WITHOUT CARDIOVERSION N/A 10/04/2015   Procedure: TRANSESOPHAGEAL ECHOCARDIOGRAM (TEE);  Surgeon: Sueanne Margarita, MD;  Location: [facility];  Service: Cardiovascular;  Laterality: N/A;   TUBAL LIGATION  1978   Family History  Problem Relation Age of Onset   Hypertension Mother    Heart disease Mother  has a pacemaker    Alzheimer's disease Mother    Hyperlipidemia Mother    Osteoporosis Mother    Heart failure Father    Hypertension Father    Alzheimer's disease Father    Hypertension Sister    Breast cancer Sister    Hypertension Sister    Gout Sister    Alcohol abuse Son    Pancreatitis Son    Cancer Neg Hx    Colon cancer Neg Hx    Esophageal cancer Neg Hx    Stomach cancer Neg Hx    Rectal cancer Neg Hx    Social History   Socioeconomic History   Marital status: Single    Spouse name: Not on file   Number of children: 2   Years of education: some colle   Highest education level: Not on file  Occupational History   Occupation: Custodian     Employer: [employer]  Tobacco Use   Smoking status: Former    Types: Cigarettes    Quit date:  07/29/2010    Years since quitting: 11.2    Passive exposure: Past   Smokeless tobacco: Never  Vaping Use   Vaping Use: Never used  Substance and Sexual Activity   Alcohol use: Not Currently   Drug use: Never   Sexual activity: Not on file    Comment: 1st intercourse 71 yo-Fewer than 5 partners  Other Topics Concern   Not on file  Social History Narrative   Live with mom and son (67).   Social Determinants of Health   Financial Resource Strain: Not on file  Food Insecurity: Not on file  Transportation Needs: Not on file  Physical Activity: Not on file  Stress: Not on file  Social Connections: Not on file    Outpatient Encounter Medications as of 11/01/2021  Medication Sig   amLODipine (NORVASC) 10 MG tablet Take 1 tablet (10 mg total) by mouth daily.   aspirin EC 81 MG tablet Take 1 tablet (81 mg total) daily by mouth.   atorvastatin (LIPITOR) 80 MG tablet Take 1 tablet (80 mg total) by mouth daily at 6 PM. (Patient taking differently: Take 40 mg by mouth daily at 6 PM.)   carvedilol (COREG) 12.5 MG tablet Take 12.5 mg by mouth 2 (two) times daily.   diclofenac Sodium (VOLTAREN) 1 % GEL Apply 2 g topically 4 (four) times daily.   fluticasone (FLONASE) 50 MCG/ACT nasal spray Place 2 sprays into both nostrils 2 (two) times daily.   furosemide (LASIX) 40 MG tablet Take 1 tablet (40 mg total) by mouth daily.   gabapentin (NEURONTIN) 300 MG capsule Take 300 mg by mouth 2 (two) times daily.   isosorbide mononitrate (IMDUR) 60 MG 24 hr tablet Take 1 tablet (60 mg total) by mouth daily.   lidocaine (HM LIDOCAINE PATCH) 4 % Place 1 patch onto the skin daily.   losartan (COZAAR) 100 MG tablet Take 1 tablet (100 mg total) by mouth daily.   Multiple Vitamin (MULITIVITAMIN WITH MINERALS) TABS Take 1 tablet by mouth daily.   nitroGLYCERIN (NITROSTAT) 0.4 MG SL tablet Place 1 tablet (0.4 mg total) under the tongue every 5 (five) minutes as needed.   ondansetron (ZOFRAN-ODT) 4 MG disintegrating  tablet Take 1 tablet (4 mg total) by mouth every 8 (eight) hours as needed for nausea or vomiting.   potassium chloride SA (KLOR-CON M) 20 MEQ tablet Take 1 tablet (20 mEq total) by mouth 2 (two) times daily.  No facility-administered encounter medications on file as of 11/01/2021.    Activities of Daily Living     No data to display          Patient Care Team: Alen Bleacher, MD as PCP - General (Family Medicine) Donato Heinz, MD as PCP - Cardiology (Cardiology) Troy Sine, MD as PCP - Sleep Medicine (Cardiology) Susa Day, MD as Consulting Physician (Orthopedic Surgery)    Assessment:   This is a routine wellness examination for [name].  Exercise Activities and Dietary recommendations     Goals      Blood Pressure < 150/90        Fall Risk    10/27/2021    1:42 PM 05/04/2021   11:12 AM 03/31/2020    2:42 PM 12/04/2018    2:41 PM 10/04/2018    2:39 PM  [place] in the past year? 0 0 0 0 0  Number falls in past yr:  0 0  0  Injury with Fall?  0 0     Is the patient's home free of loose throw rugs in walkways, pet beds, electrical cords, etc?   {Blank single:19197::"yes","no"}      Grab bars in the bathroom? {Blank single:19197::"yes","no"}      Handrails on the stairs?   {Blank single:19197::"yes","no"}      Adequate lighting?   {Blank single:19197::"yes","no"}  Patient rating of health (0-10) scale: 8   Depression Screen    10/27/2021    1:43 PM 05/04/2021   11:12 AM 10/14/2020    1:56 PM 06/10/2020    2:05 PM  PHQ 2/9 Scores  PHQ - 2 Score 0 0 0 0  PHQ- 9 Score  0 3 0     Cognitive Function        Immunization History  Administered Date(s) Administered   Fluad Quad(high Dose 65+) 05/04/2021   Influenza,inj,Quad PF,6+ Mos 03/09/2015   Influenza-Unspecified 02/08/2014   PFIZER(Purple Top)SARS-COV-2 Vaccination 05/24/2019   Pfizer Covid-19 Vaccine Bivalent Booster 3yr & up 05/04/2021   Pneumococcal Conjugate-13 03/12/2017    Tdap 09/12/2015     Screening Tests Health Maintenance  Topic Date Due   Zoster Vaccines- Shingrix (1 of 2) Never done   Pneumonia Vaccine 72 Years old (2 - PPSV23 or PCV20) 03/12/2018   COVID-19 Vaccine (3 - Pfizer risk series) 06/01/2021   INFLUENZA VACCINE  10/25/2021   MAMMOGRAM  03/11/2022   TETANUS/TDAP  09/11/2025   COLONOSCOPY (Pts 45-469yrInsurance coverage will need to be confirmed)  12/29/2025   DEXA SCAN  Completed   Hepatitis C Screening  Completed   HPV VACCINES  Aged Out    Cancer Screenings: Lung: Low Dose CT Chest recommended if Age 72-80ears, 20 pack-year currently smoking OR have quit w/in 15years. Patient {DOES NOT does:27190::"does not"} qualify. Breast:  Up to date on Mammogram? {Yes/No:30480221}   Up to date of Bone Density/Dexa? {Yes/No:30480221} Colorectal: ***  Additional Screenings: ***: Hepatitis C Screening:      Plan:   ***   I have personally reviewed and noted the following in the patient's chart:   Medical and social history Use of alcohol, tobacco or illicit drugs  Current medications and supplements Functional ability and status Nutritional status Physical activity Advanced directives List of other physicians Hospitalizations, surgeries, and ER visits in previous 12 months Vitals Screenings to include cognitive, depression, and falls Referrals and appointments  In addition, I have reviewed and discussed with patient certain preventive  protocols, quality metrics, and best practice recommendations. A written personalized care plan for preventive services as well as general preventive health recommendations were provided to patient.    This visit was conducted virtually in the setting of the [place] pandemic.    Dorna Bloom, [place]  11/01/2021

## 2021-11-02 DIAGNOSIS — Z139 Encounter for screening, unspecified: Secondary | ICD-10-CM

## 2021-11-02 LAB — GLUCOSE, POCT (MANUAL RESULT ENTRY): POC Glucose: 82 mg/dl (ref 70–99)

## 2021-11-02 NOTE — Patient Instructions (Addendum)
Lance Thank you for taking time to come for your Medicare Wellness Visit. I appreciate your ongoing commitment to your health goals. Please review the following plan we discussed and let me know if I can assist you in the future.    These are the goals we discussed:   Goals       Acknowledge receipt of Advanced Directive package       We also discussed recommended health maintenance. As discussed, you are due for: Health Maintenance  Topic Date Due   Zoster Vaccines- Shingrix (1 of 2) Never done   Pneumonia Vaccine 72+ Years old (2 - PPSV23 or PCV20) 03/12/2018   COVID-19 Vaccine (3 - Pfizer risk series) 06/01/2021   INFLUENZA VACCINE  10/25/2021   MAMMOGRAM  03/11/2022   TETANUS/TDAP  09/11/2025   COLONOSCOPY (Pts 45-16yrs Insurance coverage will need to be confirmed)  12/29/2025   DEXA SCAN  Completed   Hepatitis C Screening  Completed   HPV VACCINES  Aged Out   PCP apt scheduled for 9/12. You are eligible for shingles and pneumonia vaccines- discuss with PCP. Ask for an advance directive packet. We should have flu vaccines starting next month.  Preventive Care 30 Years and Older, Female Preventive care refers to lifestyle choices and visits with your health care provider that can promote health and wellness. Preventive care visits are also called wellness exams. What can I expect for my preventive care visit? Counseling Your health care provider may ask you questions about your: Medical history, including: Past medical problems. Family medical history. Pregnancy and menstrual history. History of falls. Current health, including: Memory and ability to understand (cognition). Emotional well-being. Home life and relationship well-being. Sexual activity and sexual health. Lifestyle, including: Alcohol, nicotine or tobacco, and drug use. Access to firearms. Diet, exercise, and sleep habits. Work and work Statistician. Sunscreen use. Safety issues such as seatbelt and  bike helmet use. Physical exam Your health care provider will check your: Height and weight. These may be used to calculate your BMI (body mass index). BMI is a measurement that tells if you are at a healthy weight. Waist circumference. This measures the distance around your waistline. This measurement also tells if you are at a healthy weight and may help predict your risk of certain diseases, such as type 2 diabetes and high blood pressure. Heart rate and blood pressure. Body temperature. Skin for abnormal spots. What immunizations do I need?  Vaccines are usually given at various ages, according to a schedule. Your health care provider will recommend vaccines for you based on your age, medical history, and lifestyle or other factors, such as travel or where you work. What tests do I need? Screening Your health care provider may recommend screening tests for certain conditions. This may include: Lipid and cholesterol levels. Hepatitis C test. Hepatitis B test. HIV (human immunodeficiency virus) test. STI (sexually transmitted infection) testing, if you are at risk. Lung cancer screening. Colorectal cancer screening. Diabetes screening. This is done by checking your blood sugar (glucose) after you have not eaten for a while (fasting). Mammogram. Talk with your health care provider about how often you should have regular mammograms. BRCA-related cancer screening. This may be done if you have a family history of breast, ovarian, tubal, or peritoneal cancers. Bone density scan. This is done to screen for osteoporosis. Talk with your health care provider about your test results, treatment options, and if necessary, the need for more tests. Follow these instructions at home: Eating  and drinking  Eat a diet that includes fresh fruits and vegetables, whole grains, lean protein, and low-fat dairy products. Limit your intake of foods with high amounts of sugar, saturated fats, and salt. Take  vitamin and mineral supplements as recommended by your health care provider. Do not drink alcohol if your health care provider tells you not to drink. If you drink alcohol: Limit how much you have to 0-1 drink a day. Know how much alcohol is in your drink. In the [place] one drink equals one 12 oz bottle of beer (355 mL), one 5 oz glass of wine (148 mL), or one 1 oz glass of hard liquor (44 mL). Lifestyle Brush your teeth every morning and night with fluoride toothpaste. Floss one time each day. Exercise for at least 30 minutes 5 or more days each week. Do not use any products that contain nicotine or tobacco. These products include cigarettes, chewing tobacco, and vaping devices, such as e-cigarettes. If you need help quitting, ask your health care provider. Do not use drugs. If you are sexually active, practice safe sex. Use a condom or other form of protection in order to prevent STIs. Take aspirin only as told by your health care provider. Make sure that you understand how much to take and what form to take. Work with your health care provider to find out whether it is safe and beneficial for you to take aspirin daily. Ask your health care provider if you need to take a cholesterol-lowering medicine (statin). Find healthy ways to manage stress, such as: Meditation, yoga, or listening to music. Journaling. Talking to a trusted person. Spending time with friends and family. Minimize exposure to UV radiation to reduce your risk of skin cancer. Safety Always wear your seat belt while driving or riding in a vehicle. Do not drive: If you have been drinking alcohol. Do not ride with someone who has been drinking. When you are tired or distracted. While texting. If you have been using any mind-altering substances or drugs. Wear a helmet and other protective equipment during sports activities. If you have firearms in your house, make sure you follow all gun safety procedures. What's  next? Visit your health care provider once a year for an annual wellness visit. Ask your health care provider how often you should have your eyes and teeth checked. Stay up to date on all vaccines. This information is not intended to replace advice given to you by your health care provider. Make sure you discuss any questions you have with your health care provider. Document Revised: 09/08/2020 Document Reviewed: 09/08/2020 Elsevier Patient Education  [facility]'s number is [phone]. Please call with questions or concerns about what we discussed today.

## 2021-11-08 ENCOUNTER — Ambulatory Visit (INDEPENDENT_AMBULATORY_CARE_PROVIDER_SITE_OTHER): Payer: Medicare Other | Admitting: Pharmacist

## 2021-11-08 ENCOUNTER — Encounter: Payer: Self-pay | Admitting: Pharmacist

## 2021-11-08 VITALS — BP 125/68 | HR 67 | Wt 199.8 lb

## 2021-11-08 DIAGNOSIS — I10 Essential (primary) hypertension: Secondary | ICD-10-CM | POA: Diagnosis not present

## 2021-11-08 NOTE — Progress Notes (Signed)
Patient ID: [name]                 DOB: 07/14/1950                      MRN: [id number]     HPI: [name] is a 71 y.o. female referred by Dr. Gardiner Rhyme to HTN clinic. PMH is significant for HTN, CAD, elevated coronary calcium score, renal infarct, HLD, OSA, and history of smoking. Patient seen by Dr Gardiner Rhyme on 03/29/21 and started on amlodipine '5mg'$  daily which was later increased to '10mg'$  daily. At last visit with HTN clinic HCTZ was restarted. Follow up lab work normal.   Previously it was revealed patient was eating many salty foods without realizing it such as fast food, canned soups, and frozen entrees.  Was advised to reduce foods high in sodium content.  Patient presents today in good spirits.  Forgot to bring medications again but has been checking BP more often at home and [facility]. Yesterday she believes her reading was 115/80  Has been eating vegetables, fruits, salads, oatmeal, and cheerios.  Has begun drinking more water  Recently was prescribed a CPAP machine but she said it is being taken away from her because the company said she did not wear it 8 hours a night.  She disagrees with this but also does not think it was helpful.  Patient reports she is tolerating all medications well.  Has had compliance issues in the past.  Current HTN meds:  HCTZ '25mg'$  daily Amlodipine '10mg'$  daily Losartan '100mg'$  daily Furosemide '40mg'$  daily PRN Imdur '15mg'$  daily Carvedilol '25mg'$  BID   Wt Readings from Last 3 Encounters:  06/23/21 196 lb 12.8 oz (89.3 kg)  06/15/21 198 lb 12.8 oz (90.2 kg)  05/26/21 198 lb 12.8 oz (90.2 kg)   BP Readings from Last 3 Encounters:  06/23/21 (!) 142/76  06/15/21 (!) 160/71  05/26/21 (!) 154/74   Pulse Readings from Last 3 Encounters:  06/23/21 80  06/15/21 62  05/26/21 64    Renal function: CrCl cannot be calculated (Patient's most recent lab result is older than the maximum 21 days allowed.).  Past Medical History:   Diagnosis Date   Abnormal EKG 2012   hospitalized for T wave inversion in lateral leads with [facility] chest pains, normal ECHO, negative trops, no cardiology consult. recent EKG 7/15 stable T wave inversions.    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 90 tablet 1   aspirin EC 81 MG tablet Take 1 tablet (81 mg total) daily by mouth. 30 tablet 11   atorvastatin (LIPITOR) 80 MG tablet Take 1 tablet (80 mg total) by mouth daily at 6 PM. (Patient taking differently: Take 40 mg by mouth daily at 6 PM.) 90 tablet 3   carvedilol (COREG) 25 MG tablet Take 1 tablet (25 mg total) by mouth 2 (two) times daily with a meal. 180 tablet 3   ezetimibe (ZETIA) 10 MG tablet  Take 1 tablet (10 mg total) by mouth daily. 90 tablet 3   fluticasone (FLONASE) 50 MCG/ACT nasal spray Place 2 sprays into both nostrils 2 (two) times daily. 18.2 mL 3   furosemide (LASIX) 40 MG tablet Take 1 tablet (40 mg total) by mouth daily. 30 tablet 5   gabapentin (NEURONTIN) 300 MG capsule Take 300 mg by mouth 2 (two) times daily.     hydrochlorothiazide (HYDRODIURIL) 25 MG tablet Take 1 tablet (25 mg total) by mouth daily. 90 tablet 1   isosorbide mononitrate (IMDUR) 30 MG 24 hr tablet Take 0.5 tablets (15 mg total) by mouth daily. 30 tablet 5   losartan (COZAAR) 100 MG tablet Take 1 tablet (100 mg total) by mouth daily. 90 tablet 3   Multiple Vitamin (MULITIVITAMIN WITH MINERALS) TABS Take 1 tablet by mouth daily.     nitroGLYCERIN (NITROSTAT) 0.4 MG SL tablet Place 1 tablet (0.4 mg total) under the tongue every 5 (five) minutes as needed. 25  tablet 3   potassium chloride SA (KLOR-CON) 20 MEQ tablet TAKE 1 TABLET(20 MEQ) BY MOUTH DAILY 90 tablet 3   No current facility-administered medications on file prior to visit.    Allergies  Allergen Reactions   Shrimp [Shellfish Allergy] Hives and Itching   Latex Itching   Penicillins Itching and Rash    Has patient had a PCN reaction causing immediate rash, facial/tongue/throat swelling, SOB or lightheadedness with hypotension: unknown Has patient had a PCN reaction causing severe rash involving mucus membranes or skin necrosis: unknown Has patient had a PCN reaction that required hospitalization unknown Has patient had a PCN reaction occurring within the last 10 years: No If all of the above answers are "NO", then may proceed with Cephalosporin use.     Assessment/Plan:  1. Hypertension -  Patient BP in room 125/68 which is at goal of <130/80 and much improved from previous visits. Reducing salt intake and improved medication compliance are likely contributing.  No medication changes needed at this time.  Continue: HCTZ '25mg'$  daily Amlodipine '10mg'$  daily Losartan '100mg'$  daily Furosemide '40mg'$  daily PRN Imdur '15mg'$  daily Carvedilol '25mg'$  BID Follow up in 1 month with Dr Kasandra Knudsen, PharmD, [place], [place], [place], [place], [place], [address] Phone: [phone], Fax: [phone]

## 2021-11-08 NOTE — Patient Instructions (Addendum)
It was good seeing you again  We would like to keep your blood pressure less than 130/80  Please continue your:  Hydrochlorothiazide '25mg'$  daily Amlodipine '10mg'$  daily Losartan '100mg'$  daily Furosemide '40mg'$  daily as needed for swelling Isosorbide '15mg'$  daily Carvedilol '25mg'$  twice a day  Continue to drink your water and limit your salt.    Keep up the good work!  Karren Cobble, PharmD, [place], [place], [address] [place], [place], [place], [address] Phone: [phone], Fax: [phone]

## 2021-11-14 ENCOUNTER — Other Ambulatory Visit: Payer: Self-pay | Admitting: Family Medicine

## 2021-11-14 ENCOUNTER — Other Ambulatory Visit: Payer: Self-pay | Admitting: Cardiology

## 2021-11-14 DIAGNOSIS — I10 Essential (primary) hypertension: Secondary | ICD-10-CM

## 2021-11-14 DIAGNOSIS — R079 Chest pain, unspecified: Secondary | ICD-10-CM

## 2021-11-15 ENCOUNTER — Other Ambulatory Visit: Payer: Self-pay

## 2021-11-15 ENCOUNTER — Other Ambulatory Visit: Payer: Self-pay | Admitting: Cardiology

## 2021-11-15 DIAGNOSIS — N280 Ischemia and infarction of kidney: Secondary | ICD-10-CM | POA: Diagnosis not present

## 2021-11-15 DIAGNOSIS — E785 Hyperlipidemia, unspecified: Secondary | ICD-10-CM

## 2021-11-15 DIAGNOSIS — I4891 Unspecified atrial fibrillation: Secondary | ICD-10-CM | POA: Diagnosis not present

## 2021-11-16 DIAGNOSIS — Z139 Encounter for screening, unspecified: Secondary | ICD-10-CM

## 2021-11-16 LAB — GLUCOSE, POCT (MANUAL RESULT ENTRY): POC Glucose: 106 mg/dl — AB (ref 70–99)

## 2021-11-18 ENCOUNTER — Telehealth: Payer: Self-pay | Admitting: Cardiology

## 2021-11-18 NOTE — Telephone Encounter (Signed)
Pt is calling requesting a call back in regards to her heart monitor results.

## 2021-11-18 NOTE — Telephone Encounter (Signed)
Returned call to patient, advised once MD reviews will call with results and/or recommendations.

## 2021-11-23 ENCOUNTER — Telehealth: Payer: Self-pay | Admitting: Cardiology

## 2021-11-23 NOTE — Telephone Encounter (Signed)
Returned call to patient left message on personal voice mail monitor results not available.I will send message to [place].

## 2021-11-23 NOTE — Telephone Encounter (Signed)
Again, patient is requesting a call back to discuss monitor results. He is hopeful to review this information ASAP.

## 2021-11-24 NOTE — Telephone Encounter (Signed)
See result note.  

## 2021-12-05 NOTE — Progress Notes (Unsigned)
      SUBJECTIVE:   CHIEF COMPLAINT / HPI:   BLE edema 39 yar old female  presents for HTN follow up Last visit 4 weeks ago patient was supposed to decrease amlodipine to 5 mg daily due to bilateral lower extremity edema.  However patient at this visit reports that she has been taking 10 mg amlodipine as she had forgotten she was supposed to decrease her dose.  Still endorses intermittent lower extremity edema.  Blood pressure  has ranged in 140s/70s.   Shoulder pain Patient report right shoulder pain that started a little over a week ago Patient report symptoms started spontaneously after she woke up. Denies any trauma to the shoulder No tingling or numbness. No UE weakness but report some stiffness. Stiffness is about the same in the morning or evening. She is able to make complete range of motion.    PERTINENT  PMH / PSH: HTN, bilateral knee osteoarthritis, lumbar DJD  OBJECTIVE:   BP 134/61   Pulse 69   Ht '5\' 5"'$  (1.651 m)   Wt 200 lb 3.2 oz (90.8 kg)   SpO2 99%   BMI 33.32 kg/m    Physical Exam General: Alert, well appearing, NAD Cardiovascular: RRR, No Murmurs, Normal S2/S2 Respiratory: CTAB, No wheezing or Rales Extremities: Normal range of motion on RUE, negative Hawkins and Neer sign. 4/5 muscle strength of RUE restricted by pain.   ASSESSMENT/PLAN:   Bilateral lower extremity edema Patient on amlodipine 10 mg didn't decrease her amlodipine dose as discussed in the last visit.  Advised patient to decrease amlodipine to 5 mg and monitor blood pressures daily for the next 2 weeks.  Plan to follow-up in 2 weeks to assess for blood pressure readings and lower extremity edema.   Right shoulder pain Differential diagnosis for right shoulder pain included rotator cuff injury, biceps tendinitis, adhesive capsulitis, and glenohumeral arthritis.  Patient's history and exam are consistent with right shoulder arthritis. -Recommend application of Voltaren gel or  lidocaine patch  -Advised use of Tylenol over Motrin or tramadol because patient is currently on aspirin per cardiology -Placed referral to physical therapy -Follow-up in 2 weeks to assess status of right shoulder pain  Alen Bleacher, MD [facility]

## 2021-12-06 ENCOUNTER — Other Ambulatory Visit: Payer: Self-pay | Admitting: Student

## 2021-12-06 ENCOUNTER — Ambulatory Visit (INDEPENDENT_AMBULATORY_CARE_PROVIDER_SITE_OTHER): Payer: Medicare Other | Admitting: Student

## 2021-12-06 ENCOUNTER — Encounter: Payer: Self-pay | Admitting: Student

## 2021-12-06 VITALS — BP 134/61 | HR 69 | Ht 65.0 in | Wt 200.2 lb

## 2021-12-06 DIAGNOSIS — M1712 Unilateral primary osteoarthritis, left knee: Secondary | ICD-10-CM

## 2021-12-06 DIAGNOSIS — R079 Chest pain, unspecified: Secondary | ICD-10-CM | POA: Diagnosis not present

## 2021-12-06 DIAGNOSIS — M25512 Pain in left shoulder: Secondary | ICD-10-CM

## 2021-12-06 DIAGNOSIS — Z23 Encounter for immunization: Secondary | ICD-10-CM

## 2021-12-06 DIAGNOSIS — I10 Essential (primary) hypertension: Secondary | ICD-10-CM

## 2021-12-06 MED ORDER — LIDOCAINE 4 % EX PTCH: 1.0000 | MEDICATED_PATCH | CUTANEOUS | 0 refills | Status: DC

## 2021-12-06 MED ORDER — DICLOFENAC SODIUM 1 % EX GEL: 2.0000 g | Freq: Four times a day (QID) | CUTANEOUS | 1 refills | Status: DC

## 2021-12-06 MED ORDER — AMLODIPINE BESYLATE 5 MG PO TABS: 5.0000 mg | ORAL_TABLET | Freq: Every day | ORAL | 0 refills | Status: DC

## 2021-12-06 NOTE — Patient Instructions (Signed)
It was wonderful to meet you today. Thank you for allowing me to be a part of your care. Below is a short summary of what we discussed at your visit today:  Decrease the amlodipine to 5 mg daily.  Take it for 2 weeks to monitor your blood pressures during this time.  We will follow-up with you in 2 weeks to check your blood pressure readings and see if your leg swellings have improved.  For your shoulder pain and I recommend using Voltaren gel all lidocaine patch which I have refilled for you.  I have also sent in referral for physical therapy.  Please bring all of your medications to every appointment!  If you have any questions or concerns, please do not hesitate to contact us via phone or MyChart message.   Alen Bleacher, MD [facility]

## 2021-12-07 ENCOUNTER — Encounter (HOSPITAL_COMMUNITY): Payer: Self-pay | Admitting: Emergency Medicine

## 2021-12-07 ENCOUNTER — Other Ambulatory Visit: Payer: Self-pay

## 2021-12-07 ENCOUNTER — Ambulatory Visit (HOSPITAL_COMMUNITY)
Admission: EM | Admit: 2021-12-07 | Discharge: 2021-12-07 | Disposition: A | Discharge status: Home / Self Care | Payer: Medicare Other | Attending: Nurse Practitioner | Admitting: Nurse Practitioner

## 2021-12-07 ENCOUNTER — Ambulatory Visit (INDEPENDENT_AMBULATORY_CARE_PROVIDER_SITE_OTHER): Payer: Medicare Other

## 2021-12-07 DIAGNOSIS — M5432 Sciatica, left side: Secondary | ICD-10-CM | POA: Diagnosis not present

## 2021-12-07 DIAGNOSIS — M1712 Unilateral primary osteoarthritis, left knee: Secondary | ICD-10-CM

## 2021-12-07 DIAGNOSIS — M19012 Primary osteoarthritis, left shoulder: Secondary | ICD-10-CM

## 2021-12-07 DIAGNOSIS — M25562 Pain in left knee: Secondary | ICD-10-CM | POA: Diagnosis not present

## 2021-12-07 DIAGNOSIS — M25512 Pain in left shoulder: Secondary | ICD-10-CM

## 2021-12-07 MED ORDER — METHYLPREDNISOLONE 4 MG PO TBPK: ORAL_TABLET | ORAL | 0 refills | Status: DC

## 2021-12-07 MED ORDER — KETOROLAC TROMETHAMINE 60 MG/2ML IM SOLN
INTRAMUSCULAR | Status: AC
  Filled 2021-12-07: qty 2

## 2021-12-07 MED ORDER — MELOXICAM 15 MG PO TABS: 15.0000 mg | ORAL_TABLET | Freq: Every day | ORAL | 0 refills | Status: DC

## 2021-12-07 MED ORDER — KETOROLAC TROMETHAMINE 60 MG/2ML IM SOLN
60.0000 mg | Freq: Once | INTRAMUSCULAR | Status: AC
  Administered 2021-12-07: 60 mg via INTRAMUSCULAR

## 2021-12-07 NOTE — ED Triage Notes (Signed)
Reports she woke with pain in left side and shoulder.  No known injury.  Reports pain is in left knee too.

## 2021-12-07 NOTE — Discharge Instructions (Signed)
X-rays of your shoulder and your knee shows that you have arthritis.  I have prescribed a medication for you to take every day to help with that.  Do not take any over-the-counter NSAIDs such as Motrin, ibuprofen, naproxen or Aleve.  Continue using the Voltaren gel that your primary care doctor provided you.  You may apply it to both your shoulder and your knee 4 times a day.  I have also prescribed you a short course of steroids that will help with your pain as well.  Follow-up with your primary care in 1 week as he may want to continue you on the meloxicam for your arthritis.

## 2021-12-07 NOTE — ED Provider Notes (Signed)
[place]    CSN: [id number] Arrival date & time: 12/07/21  1637      History   Chief Complaint Chief Complaint  Patient presents with   Back Pain    HPI [name] is a 71 y.o. female.   [name] is a 71 y.o. female that complains of pain in the left shoulder, left lower back, left knee and left lower leg.  Patient denies any injury. She reports that she woke up with this pain 2 days ago. Patient describes pain as aching. Pain severity now is severe. The pain does not radiate. Pain is aggravated by movement and use. Pain is alleviated by nothing. She denies any associated numbness, tingling, weakness, loss of sensation, loss of motion, or inability to bear weight within the left upper and left lower extremities. The patient denies other areas of discomfort. The patient was seen for the shoulder pain by her PCP yesterday. She was prescribed Voltaren gel in which she used last night without any relief in her pain.          Past Medical History:  Diagnosis Date   Abnormal EKG 2012   hospitalized for T wave inversion in lateral leads with MSK chest pains, normal ECHO, negative trops, no cardiology consult. recent EKG 7/15 stable T wave inversions.    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   blood clot in the kidney    Patient Active Problem List   Diagnosis Date Noted   Daytime somnolence 01/12/2021   Atypical chest pain 12/13/2020   Abnormal cardiac CT angiography 12/13/2020   Left elbow pain 03/31/2020   Contact dermatitis 01/14/2019   Vaginal itching 12/05/2018   Pelvic pain 10/07/2018   Leg edema 08/28/2018   Thickened endometrium    Umbilical hernia without obstruction and without gangrene 01/01/2018   Osteopenia 09/05/2017   Closed  nondisplaced spiral fracture of shaft of right tibia 03/28/2017   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   HLD (hyperlipidemia)    Aortic atherosclerosis ([place])    Back pain 07/08/2015   Scotoma of blind spot area in visual field 06/14/2015   Bilateral leg pain 06/14/2015   Seasonal allergies 03/09/2015   Hypopigmentation 03/09/2015   Osteoarthritis of left knee 07/08/2014   DJD (degenerative joint disease), lumbar 07/08/2014   Osteoarthritis of right knee 07/08/2014   Urinary frequency 06/11/2014   Healthcare maintenance 06/11/2014   Allergic rhinoconjunctivitis of both eyes 06/11/2014   Nearsightedness 04/27/2014   Abnormal EKG    Hypertension 12/11/2013    Past Surgical History:  Procedure Laterality Date   ABDOMINAL SURGERY  2010   for bowel blockage   COLON SURGERY  2010   bowel blockage   COLONOSCOPY  09/2014   HYSTEROSCOPY WITH D & C N/A 05/08/2018   Procedure: DILATATION AND CURETTAGE /HYSTEROSCOPY;  Surgeon: Mora Bellman, MD;  Location: [place];  Service: Gynecology;  Laterality: N/A;   KNEE ARTHROSCOPY Left 09/25/2013   Procedure: LEFT KNEE ARTHROSCOPY WITH PARTIAL MEDIAL AND LATERAL  MENISCECTOMY/DEBRIDEMENT/MICRO FRACTURE MEDIAL FEMORAL CONDYLE, REMOVAL OF OSTEOCONDRAL FRAGMENT;  Surgeon: Johnn Hai, MD;  Location: [facility];  Service: Orthopedics;  Laterality: Left;   KNEE SURGERY Right  1999   LEFT HEART CATH AND CORONARY ANGIOGRAPHY N/A 12/13/2020   Procedure: LEFT HEART CATH AND CORONARY ANGIOGRAPHY;  Surgeon: Nelva Bush, MD;  Location: [facility];  Service: Cardiovascular;  Laterality: N/A;   POLYPECTOMY     TEE WITHOUT CARDIOVERSION N/A 10/04/2015   Procedure: TRANSESOPHAGEAL ECHOCARDIOGRAM (TEE);  Surgeon: Sueanne Margarita, MD;  Location: [facility];  Service: Cardiovascular;  Laterality: N/A;   TUBAL LIGATION  1978    OB History     Gravida  2   Para  2   Term      Preterm      AB       Living  2      SAB      IAB      Ectopic      Multiple      Live Births               Home Medications    Prior to Admission medications   Medication Sig Start Date End Date Taking? Authorizing Provider  meloxicam (MOBIC) 15 MG tablet Take 1 tablet (15 mg total) by mouth daily. 12/07/21  Yes Enrique Sack, FNP  methylPREDNISolone (MEDROL DOSEPAK) 4 MG TBPK tablet Take as directed 12/07/21  Yes Zair Borawski, Aldona Bar, FNP  amLODipine (NORVASC) 5 MG tablet TAKE 1 TABLET(5 MG) BY MOUTH DAILY 12/07/21   Alen Bleacher, MD  aspirin EC 81 MG tablet Take 1 tablet (81 mg total) daily by mouth. 01/31/17   Clent Demark, PA-C  atorvastatin (LIPITOR) 80 MG tablet TAKE 1 TABLET(80 MG) BY MOUTH DAILY AT 6 PM 11/15/21   Donato Heinz, MD  carvedilol (COREG) 12.5 MG tablet Take 12.5 mg by mouth 2 (two) times daily. 09/02/21   [provider]  diclofenac Sodium (VOLTAREN) 1 % GEL Apply 2 g topically 4 (four) times daily. 12/06/21   Alen Bleacher, MD  ezetimibe (ZETIA) 10 MG tablet Take by mouth. 10/26/21   [provider]  fluticasone (FLONASE) 50 MCG/ACT nasal spray SHAKE LIQUID AND USE 2 SPRAYS IN EACH NOSTRIL TWICE DAILY 11/14/21   Alen Bleacher, MD  furosemide (LASIX) 40 MG tablet Take 1 tablet (40 mg total) by mouth daily. 12/13/20 12/13/21  End, Harrell Gave, MD  gabapentin (NEURONTIN) 300 MG capsule Take 300 mg by mouth 2 (two) times daily. 11/14/19   [provider]  isosorbide mononitrate (IMDUR) 60 MG 24 hr tablet Take 1 tablet (60 mg total) by mouth daily. 7/82/95   Delora Fuel, MD  lidocaine (HM LIDOCAINE PATCH) 4 % Place 1 patch onto the skin daily. 12/06/21   Alen Bleacher, MD  losartan (COZAAR) 100 MG tablet Take 1 tablet (100 mg total) by mouth daily. 12/29/20   Donato Heinz, MD  Multiple Vitamin (MULITIVITAMIN WITH MINERALS) TABS Take 1 tablet by mouth daily.    [provider]  nitroGLYCERIN (NITROSTAT) 0.4 MG SL tablet Place 1  tablet (0.4 mg total) under the tongue every 5 (five) minutes as needed. 04/28/21 09/14/21  Donato Heinz, MD  ondansetron (ZOFRAN-ODT) 4 MG disintegrating tablet Take 1 tablet (4 mg total) by mouth every 8 (eight) hours as needed for nausea or vomiting. 10/20/21   Barrett Henle, MD  potassium chloride SA (KLOR-CON M) 20 MEQ tablet Take 1 tablet (20 mEq total) by mouth 2 (two) times daily. 09/14/28   Delora Fuel, MD    Family History Family History  Problem Relation Age of Onset   Hypertension Mother  Heart disease Mother        has a pacemaker    Alzheimer's disease Mother    Hyperlipidemia Mother    Osteoporosis Mother    Heart failure Father    Hypertension Father    Alzheimer's disease Father    Hypertension Sister    Breast cancer Sister    Hypertension Sister    Gout Sister    Heart disease Son    Alcohol abuse Son    Pancreatitis Son    Hypertension Son    Cancer Neg Hx    Colon cancer Neg Hx    Esophageal cancer Neg Hx    Stomach cancer Neg Hx    Rectal cancer Neg Hx     Social History Social History   Tobacco Use   Smoking status: Former    Types: Cigarettes    Quit date: 07/29/2010    Years since quitting: 11.3    Passive exposure: Past   Smokeless tobacco: Never  Vaping Use   Vaping Use: Never used  Substance Use Topics   Alcohol use: Not Currently   Drug use: Never     Allergies   Shrimp [shellfish allergy], Latex, and Penicillins   Review of Systems Review of Systems   Physical Exam Triage Vital Signs ED Triage Vitals  Enc Vitals Group     BP 12/07/21 1744 (!) 146/74     Pulse Rate 12/07/21 1744 70     Resp 12/07/21 1744 20     Temp 12/07/21 1744 98.2 F (36.8 C)     Temp Source 12/07/21 1744 Oral     SpO2 12/07/21 1744 97 %     Weight --      Height --      Head Circumference --      Peak Flow --      Pain Score 12/07/21 1741 8     Pain Loc --      Pain Edu? --      Excl. in [place]? --    No data found.  Updated  Vital Signs BP (!) 146/74 (BP Location: Right Arm) Comment (BP Location): large cuff  Pulse 70   Temp 98.2 F (36.8 C) (Oral)   Resp 20   SpO2 97%   Visual Acuity Right Eye Distance:   Left Eye Distance:   Bilateral Distance:    Right Eye Near:   Left Eye Near:    Bilateral Near:     Physical Exam   UC Treatments / Results  Labs (all labs ordered are listed, but only abnormal results are displayed) Labs Reviewed - No data to display  EKG   Radiology DG Knee Complete 4 Views Left  Result Date: 12/07/2021 CLINICAL DATA:  Left knee pain.  No known injury. EXAM: LEFT KNEE - COMPLETE 4+ VIEW COMPARISON:  None Available. FINDINGS: There is no acute fracture or dislocation. The bones are osteopenic. Severe arthritic changes of the knee with tricompartmental narrowing. No significant joint effusion. The soft tissues are unremarkable. IMPRESSION: No acute fracture or dislocation.  Osteoarthritic changes. Electronically Signed   By: Anner Crete M.D.   On: 12/07/2021 19:11   DG Shoulder Left  Result Date: 12/07/2021 CLINICAL DATA:  Pain x2 days EXAM: LEFT SHOULDER - 2+ VIEW COMPARISON:  None Available. FINDINGS: No fracture or dislocation is seen. Bony spurs are noted in the inferior aspect of glenohumeral joint. There are no abnormal soft tissue calcifications. There are no focal lytic lesions. IMPRESSION: No recent  fracture or dislocation is seen in left shoulder. Degenerative changes with small bony spurs are noted in glenohumeral joint. Electronically Signed   By: Elmer Picker M.D.   On: 12/07/2021 19:11    Procedures Procedures (including critical care time)  Medications Ordered in UC Medications  ketorolac (TORADOL) injection 60 mg (60 mg Intramuscular Given 12/07/21 1932)    Initial Impression / Assessment and Plan / UC Course  I have reviewed the triage vital signs and the nursing notes.  Pertinent labs & imaging results that were available during my care of  the patient were reviewed by me and considered in my medical decision making (see chart for details).  Clinical Course as of 12/07/21 1936  Wed Dec 07, 2021  1929 DG Shoulder Left [SM]    Clinical Course User Index [SM] Enrique Sack, FNP   71 year old female presenting with left shoulder, left lower back, left knee and left lower leg pain for the past 2 days.  She denies any injury.  Pain is described as a constant ache and is severe in intensity.  Pain does not radiate.  There is no numbness, tingling, weakness or loss of sensation of the extremities.  He has tried ibuprofen and Voltaren gel without any relief in her symptoms.  X-ray of the left shoulder and left knee degenerative changes.  Patient received Toradol injection in the clinic.  She will be going home with a prescription for a Medrol Dosepak and meloxicam.  Advised not to take any additional over-the-counter NSAIDs with these medications.  Follow-up with her primary care in 1 week or sooner if needed.  Today's evaluation has revealed no signs of a dangerous process. Discussed diagnosis with patient and/or guardian. Patient and/or guardian aware of their diagnosis, possible red flag symptoms to watch out for and need for close follow up. Patient and/or guardian understands verbal and written discharge instructions. Patient and/or guardian comfortable with plan and disposition.  Patient and/or guardian has a clear mental status at this time, good insight into illness (after discussion and teaching) and has clear judgment to make decisions regarding their care  Documentation was completed with the aid of voice recognition software. Transcription may contain typographical errors. Final Clinical Impressions(s) / UC Diagnoses   Final diagnoses:  Osteoarthritis of left shoulder, unspecified osteoarthritis type  Osteoarthritis of left knee, unspecified osteoarthritis type  Sciatica of left side without back pain     Discharge  Instructions      X-rays of your shoulder and your knee shows that you have arthritis.  I have prescribed a medication for you to take every day to help with that.  Do not take any over-the-counter NSAIDs such as Motrin, ibuprofen, naproxen or Aleve.  Continue using the Voltaren gel that your primary care doctor provided you.  You may apply it to both your shoulder and your knee 4 times a day.  I have also prescribed you a short course of steroids that will help with your pain as well.  Follow-up with your primary care in 1 week as he may want to continue you on the meloxicam for your arthritis.     ED Prescriptions     Medication Sig Dispense Auth. Provider   meloxicam (MOBIC) 15 MG tablet Take 1 tablet (15 mg total) by mouth daily. 30 tablet Enrique Sack, FNP   methylPREDNISolone (MEDROL DOSEPAK) 4 MG TBPK tablet Take as directed 21 tablet Enrique Sack, FNP      PDMP not reviewed this encounter.  Enrique Sack, [place] 12/07/21 1936

## 2021-12-15 ENCOUNTER — Ambulatory Visit: Payer: Medicare Other

## 2021-12-22 NOTE — Progress Notes (Signed)
Patient here for bp check.

## 2021-12-28 ENCOUNTER — Ambulatory Visit: Payer: Medicare Other | Attending: Cardiology | Admitting: Cardiology

## 2021-12-28 ENCOUNTER — Encounter: Payer: Self-pay | Admitting: Cardiology

## 2021-12-28 VITALS — BP 146/66 | HR 65 | Ht 65.0 in | Wt 203.4 lb

## 2021-12-28 DIAGNOSIS — I5032 Chronic diastolic (congestive) heart failure: Secondary | ICD-10-CM | POA: Diagnosis not present

## 2021-12-28 DIAGNOSIS — I10 Essential (primary) hypertension: Secondary | ICD-10-CM

## 2021-12-28 DIAGNOSIS — I2510 Atherosclerotic heart disease of native coronary artery without angina pectoris: Secondary | ICD-10-CM

## 2021-12-28 DIAGNOSIS — E785 Hyperlipidemia, unspecified: Secondary | ICD-10-CM | POA: Diagnosis not present

## 2021-12-28 NOTE — Progress Notes (Signed)
Cardiology Office Note:    Date:  12/28/2021   ID:  [name], DOB 06-05-1950, MRN [id number]  PCP:  Alen Bleacher, MD  Cardiologist:  Donato Heinz, MD  Electrophysiologist:  None   Referring MD: Alen Bleacher, MD   Chief Complaint  Patient presents with   Coronary Artery Disease    History of Present Illness:    [name] is a 71 y.o. female with a hx of hypertension, hyperlipidemia, renal infarct, ?aortic valve vegetation who presents for follow-up.  She was referred by Dr. Andria Frames for evaluation of chest pain, initially seen 11/18/2020.  She was admitted to [facility] from 7/6 through 10/04/2015 with lower abdominal pain.  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  She was started on heparin drip.  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  Blood cultures were negative.  She was started on anticoagulation with warfarin.  Echo 10/01/2015 showed EF 65 to 12%, grade 1 diastolic dysfunction.  She completed 6 months of anticoagulation and was then transitioned to aspirin 81 mg daily.  No further episodes.  Coronary CTA on 11/26/2020 showed moderate CAD in mid and distal RCA, calcium score 936 (97th percentile).  CT FFR suggested total occlusion in mid to distal RCA and indeterminate FFR value in distal LAD that may be due to vessel tapering of small caliber distal vessel stenosis.  Cath 12/13/2020 showed chronic subtotal (99%) occlusion of distal RCA with left-to-right collaterals, mild proximal LAD disease.  Zio patch x14 days 11/15/2021 showed no significant arrhythmias.  Since last clinic visit, she reports has been doing okay.  Denies any chest pain, dyspnea, lightheadedness, syncope, or palpitations.  Reports some LE edema.   BP Readings from Last 3 Encounters:  12/28/21 (!) 146/66  12/22/21 (!) 146/82  12/07/21 (!) 146/74   Wt Readings from Last 3 Encounters:  12/28/21 203 lb 6.4  oz (92.3 kg)  12/06/21 200 lb 3.2 oz (90.8 kg)  11/08/21 199 lb 12.8 oz (90.6 kg)      Past Medical History:  Diagnosis Date   Abnormal EKG 2012   hospitalized for T wave inversion in lateral leads with MSK chest pains, normal ECHO, negative trops, no cardiology consult. recent EKG 7/15 stable T wave inversions.    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   blood clot in the kidney    Past Surgical History:  Procedure Laterality Date   ABDOMINAL SURGERY  2010   for bowel blockage   COLON SURGERY  2010   bowel blockage   COLONOSCOPY  09/2014   HYSTEROSCOPY WITH D & C N/A 05/08/2018   Procedure: DILATATION AND CURETTAGE /HYSTEROSCOPY;  Surgeon: Mora Bellman, MD;  Location: [place];  Service: Gynecology;  Laterality: N/A;   KNEE ARTHROSCOPY Left 09/25/2013   Procedure: LEFT KNEE ARTHROSCOPY WITH PARTIAL MEDIAL AND LATERAL  MENISCECTOMY/DEBRIDEMENT/MICRO FRACTURE MEDIAL FEMORAL CONDYLE, REMOVAL OF OSTEOCONDRAL FRAGMENT;  Surgeon: Johnn Hai, MD;  Location: [facility];  Service: Orthopedics;  Laterality: Left;   KNEE SURGERY Right 1999   LEFT HEART CATH AND CORONARY ANGIOGRAPHY N/A 12/13/2020   Procedure: LEFT HEART CATH AND CORONARY ANGIOGRAPHY;  Surgeon: End,  Harrell Gave, MD;  Location: [facility];  Service: Cardiovascular;  Laterality: N/A;   POLYPECTOMY     TEE WITHOUT CARDIOVERSION N/A 10/04/2015   Procedure: TRANSESOPHAGEAL ECHOCARDIOGRAM (TEE);  Surgeon: Sueanne Margarita, MD;  Location[facility];  Service: Cardiovascular;  Laterality: N/A;   TUBAL LIGATION  1978    Current Medications: Current Meds  Medication Sig   amLODipine (NORVASC) 5 MG tablet TAKE 1 TABLET(5 MG) BY MOUTH DAILY   aspirin EC 81 MG tablet Take 1 tablet (81 mg total) daily by  mouth.   atorvastatin (LIPITOR) 80 MG tablet TAKE 1 TABLET(80 MG) BY MOUTH DAILY AT 6 PM   carvedilol (COREG) 12.5 MG tablet Take 12.5 mg by mouth 2 (two) times daily.   diclofenac Sodium (VOLTAREN) 1 % GEL Apply 2 g topically 4 (four) times daily.   ezetimibe (ZETIA) 10 MG tablet Take by mouth.   fluticasone (FLONASE) 50 MCG/ACT nasal spray SHAKE LIQUID AND USE 2 SPRAYS IN EACH NOSTRIL TWICE DAILY   furosemide (LASIX) 40 MG tablet Take 1 tablet (40 mg total) by mouth daily.   gabapentin (NEURONTIN) 300 MG capsule Take 300 mg by mouth 2 (two) times daily.   isosorbide mononitrate (IMDUR) 60 MG 24 hr tablet Take 1 tablet (60 mg total) by mouth daily.   lidocaine (HM LIDOCAINE PATCH) 4 % Place 1 patch onto the skin daily.   losartan (COZAAR) 100 MG tablet Take 1 tablet (100 mg total) by mouth daily.   meloxicam (MOBIC) 15 MG tablet Take 1 tablet (15 mg total) by mouth daily.   methylPREDNISolone (MEDROL DOSEPAK) 4 MG TBPK tablet Take as directed   Multiple Vitamin (MULITIVITAMIN WITH MINERALS) TABS Take 1 tablet by mouth daily.   nitroGLYCERIN (NITROSTAT) 0.4 MG SL tablet Place 1 tablet (0.4 mg total) under the tongue every 5 (five) minutes as needed.   potassium chloride SA (KLOR-CON M) 20 MEQ tablet Take 1 tablet (20 mEq total) by mouth 2 (two) times daily.     Allergies:   Shrimp [shellfish allergy], Latex, and Penicillins   Social History   Socioeconomic History   Marital status: Single    Spouse name: Not on file   Number of children: 2   Years of education: 12   Highest education level: 12th grade  Occupational History   Occupation: Custodian    Employer: [employer]: RETIRED  Tobacco Use   Smoking status: Former    Types: Cigarettes    Quit date: 07/29/2010    Years since quitting: 11.4    Passive exposure: Past   Smokeless tobacco: Never  Vaping Use   Vaping Use: Never used  Substance and Sexual Activity   Alcohol use: Not Currently   Drug use:  Never   Sexual activity: Not Currently    Birth control/protection: Surgical    Comment: 1st intercourse 71 yo-Fewer than 5 partners  Other Topics Concern   Not on file  Social History Narrative   Patient and her one of her sons live together in [place].   Patient has another son who resides in [place], [place].   Never married      Patient enjoys spending time at a senior recreational center. Senior activities and dinners.       Patient walks ~15 minutes per day.   Social Determinants of Health   Financial Resource Strain: Low Risk  (11/01/2021)   Overall Financial Resource Strain (CARDIA)    Difficulty of Paying Living  Expenses: Not hard at all  Food Insecurity: No Food Insecurity (11/01/2021)   Hunger Vital Sign    Worried About Running Out of Food in the Last Year: Never true    Ran Out of Food in the Last Year: Never true  Transportation Needs: No Transportation Needs (11/01/2021)   PRAPARE - Hydrologist (Medical): No    Lack of Transportation (Non-Medical): No  Physical Activity: Insufficiently Active (11/01/2021)   Exercise Vital Sign    Days of Exercise per Week: 7 days    Minutes of Exercise per Session: 20 min  Stress: No Stress Concern Present (11/01/2021)   [place]    Feeling of Stress : Only a little  Social Connections: Moderately Integrated (11/01/2021)   Social Connection and Isolation Panel [NHANES]    Frequency of Communication with Friends and Family: More than three times a week    Frequency of Social Gatherings with Friends and Family: More than three times a week    Attends Religious Services: More than 4 times per year    Active Member of [employer] or Organizations: Yes    Attends Music therapist: More than 4 times per year    Marital Status: Never married     Family History: The patient's family history includes Alcohol abuse in her son; Alzheimer's  disease in her father and mother; Breast cancer in her sister; Gout in her sister; Heart disease in her mother and son; Heart failure in her father; Hyperlipidemia in her mother; Hypertension in her father, mother, sister, sister, and son; Osteoporosis in her mother; Pancreatitis in her son. There is no history of Cancer, Colon cancer, Esophageal cancer, Stomach cancer, or Rectal cancer.  ROS:   Please see the history of present illness.     All other systems reviewed and are negative.  EKGs/Labs/Other Studies Reviewed:    The following studies were reviewed today:   EKG:  EKG is  ordered today.  The ekg ordered today demonstrates normal sinus rhythm, rate 68, T wave inversions in leads I, aVL, V3-6   Recent Labs: 09/17/2021: Hemoglobin 11.6; Platelets 305 10/18/2021: BUN 25; Creatinine, Ser 1.15; Magnesium 2.0; Potassium 4.1; Sodium 141  Recent Lipid Panel    Component Value Date/Time   CHOL 204 (H) 06/14/2021 1426   TRIG 158 (H) 06/14/2021 1426   HDL 53 06/14/2021 1426   CHOLHDL 3.8 06/14/2021 1426   CHOLHDL 4.7 10/01/2015 0732   VLDL 27 10/01/2015 0732   LDLCALC 123 (H) 06/14/2021 1426    Physical Exam:    VS:  BP (!) 146/66   Pulse 65   Ht '5\' 5"'$  (1.651 m)   Wt 203 lb 6.4 oz (92.3 kg)   SpO2 96%   BMI 33.85 kg/m     Wt Readings from Last 3 Encounters:  12/28/21 203 lb 6.4 oz (92.3 kg)  12/06/21 200 lb 3.2 oz (90.8 kg)  11/08/21 199 lb 12.8 oz (90.6 kg)     GEN:  Well nourished, well developed in no acute distress HEENT: Normal NECK: No JVD; No carotid bruits LYMPHATICS: No lymphadenopathy CARDIAC: ZOX,0/9 systolic murmur RESPIRATORY:  Clear to auscultation without rales, wheezing or rhonchi  ABDOMEN: Soft, non-tender, non-distended MUSCULOSKELETAL:  Trace edema; No deformity  SKIN: Warm and dry NEUROLOGIC:  Alert and oriented x 3 PSYCHIATRIC:  Normal affect   ASSESSMENT:    1. CAD in native artery   2. Chronic diastolic  heart failure ([place])   3.  Hypertension, unspecified type   4. Hyperlipidemia, unspecified hyperlipidemia type     PLAN:    CAD: Reports atypical chest pain, no clear relationship with exertion but does appear to be correlated to stress.  Coronary CTA on 11/26/2020 showed moderate CAD in mid and distal RCA, calcium score 936 (97th percentile).  CT FFR suggested total occlusion in mid to distal RCA and indeterminate FFR value in distal LAD that may be due to vessel tapering of small caliber distal vessel stenosis.  Cath 12/13/2020 showed chronic subtotal (99%) occlusion of distal RCA with left-to-right collaterals, mild proximal LAD disease.  Echocardiogram 12/10/2020 showed normal biventricular function, no significant valvular disease. -Continue aspirin 81 mg daily -Continue atorvastatin 80 mg daily -Continue carvedilol 25 mg twice daily -Continue Imdur 60 mg daily -As needed SL NTG -If refractory angina despite optimized antianginal regimen, could consider PCI to distal RCA.  Currently denies any anginal symptoms  Chronic diastolic heart failure: LVEDP 25-30 on cath 12/13/2020.  Started on Lasix 40 mg daily.  Now on HCTZ 25 mg daily for hypertension, with Lasix 40 mg daily as needed if gains more than 3 pounds in 1 day or 5 pounds in 1 week (though patient is unclear what she is actually taking- thinks she may be taking both HCTZ and lasix; asked her to bring all her meds to pharmD appointment)  Heart murmur: Echocardiogram 12/10/2020 showed normal biventricular function, no significant valvular disease.  Renal infarct: Presented with renal infarct in 2017.  TEE showed possible aortic valve vegetation versus Lambls excrescence.  Blood cultures negative.  Completed 6 months of warfarin.  Has not had recurrent evidence of embolic events.  Echocardiogram 12/10/2020 unremarkable.  Zio patch x14 days 11/15/2021 showed no significant arrhythmias.  Hypertension: Last visit with pharmacist states that she should be taking: carvedilol 25  mg twice daily, HCTZ 25 mg daily, losartan 100 mg daily, amlodipine 10 mg daily, Imdur 15 mg daily, Lasix 40 mg daily as needed.  However, her discharge list from that appointment was much different: Coreg 12.5 mg twice daily, amlodipine 5 mg daily, Imdur 60 mg daily, Lasix 40 mg daily, no HCTZ.  She does not know what she is taking.  Recommend bringing all her medications to appointment with pharmacist so we can figure out what regimen she is taking.  Check BMET, magnesium  Hyperlipidemia: On atorvastatin 40 mg daily,  LDL 109 on 10/14/2020.  Atorvastatin increased to 80 mg daily.  LDL 123 on 06/14/2021, suspect noncompliance.  Check lipid panel  OSA: Follows with Dr. Claiborne Billings.  Was started on CPAP but unfortunately was noncompliant and her machine was taken back by her DME company 08/2021   RTC in 3 months   Medication Adjustments/Labs and Tests Ordered: Current medicines are reviewed at length with the patient today.  Concerns regarding medicines are outlined above.  Orders Placed This Encounter  Procedures   Basic metabolic panel   Lipid panel   Magnesium      No orders of the defined types were placed in this encounter.      Patient Instructions  Medication Instructions:  Your physician recommends that you continue on your current medications as directed. Please refer to the Current Medication list given to you today.  *If you need a refill on your cardiac medications before your next appointment, please call your pharmacy*  Follow-Up: At [facility], you and your health needs are our priority.  As part of  our continuing mission to provide you with exceptional heart care, we have created designated Provider Care Teams.  These Care Teams include your primary Cardiologist (physician) and Advanced Practice Providers (APPs -  Physician Assistants and Nurse Practitioners) who all work together to provide you with the care you need, when you need it.  We recommend signing up  for the patient portal called "MyChart".  Sign up information is provided on this After Visit Summary.  MyChart is used to connect with patients for Virtual Visits (Telemedicine).  Patients are able to view lab/test results, encounter notes, upcoming appointments, etc.  Non-urgent messages can be sent to your provider as well.   To learn more about what you can do with MyChart, go to [email or web address].    Your next appointment:   With pharmacist (PLEASE BRING ALL MEDICATIONS) 3 months with NP or PA 6 months with Dr. Gardiner Rhyme           Signed, Donato Heinz, MD  12/28/2021 2:04 PM    [place]

## 2021-12-28 NOTE — Patient Instructions (Signed)
Medication Instructions:  Your physician recommends that you continue on your current medications as directed. Please refer to the Current Medication list given to you today.  *If you need a refill on your cardiac medications before your next appointment, please call your pharmacy*  Follow-Up: At [facility], you and your health needs are our priority.  As part of our continuing mission to provide you with exceptional heart care, we have created designated Provider Care Teams.  These Care Teams include your primary Cardiologist (physician) and Advanced Practice Providers (APPs -  Physician Assistants and Nurse Practitioners) who all work together to provide you with the care you need, when you need it.  We recommend signing up for the patient portal called "MyChart".  Sign up information is provided on this After Visit Summary.  MyChart is used to connect with patients for Virtual Visits (Telemedicine).  Patients are able to view lab/test results, encounter notes, upcoming appointments, etc.  Non-urgent messages can be sent to your provider as well.   To learn more about what you can do with MyChart, go to [email or web address].    Your next appointment:   With pharmacist (PLEASE BRING ALL MEDICATIONS) 3 months with NP or PA 6 months with Dr. Gardiner Rhyme

## 2021-12-29 LAB — LIPID PANEL
Chol/HDL Ratio: 3.8 ratio (ref 0.0–4.4)
Cholesterol, Total: 189 mg/dL (ref 100–199)
HDL: 50 mg/dL (ref >39)
LDL Chol Calc (NIH): 105 mg/dL — ABNORMAL HIGH (ref 0–99)
Triglycerides: 196 mg/dL — ABNORMAL HIGH (ref 0–149)
VLDL Cholesterol Cal: 34 mg/dL (ref 5–40)

## 2021-12-29 LAB — MAGNESIUM: Magnesium: 1.8 mg/dL (ref 1.6–2.3)

## 2021-12-29 LAB — BASIC METABOLIC PANEL
BUN/Creatinine Ratio: 15 (ref 12–28)
BUN: 17 mg/dL (ref 8–27)
CO2: 24 mmol/L (ref 20–29)
Calcium: 9.6 mg/dL (ref 8.7–10.3)
Chloride: 105 mmol/L (ref 96–106)
Creatinine, Ser: 1.11 mg/dL — ABNORMAL HIGH (ref 0.57–1.00)
Glucose: 68 mg/dL — ABNORMAL LOW (ref 70–99)
Potassium: 4.4 mmol/L (ref 3.5–5.2)
Sodium: 143 mmol/L (ref 134–144)
eGFR: 53 mL/min/{1.73_m2} — ABNORMAL LOW (ref >59)

## 2022-01-08 ENCOUNTER — Encounter (HOSPITAL_COMMUNITY): Payer: Self-pay | Admitting: Emergency Medicine

## 2022-01-08 ENCOUNTER — Other Ambulatory Visit: Payer: Self-pay | Admitting: Cardiology

## 2022-01-08 ENCOUNTER — Ambulatory Visit (HOSPITAL_COMMUNITY)
Admission: EM | Admit: 2022-01-08 | Discharge: 2022-01-08 | Disposition: A | Discharge status: Home / Self Care | Payer: Medicare Other | Attending: Physician Assistant | Admitting: Physician Assistant

## 2022-01-08 DIAGNOSIS — I10 Essential (primary) hypertension: Secondary | ICD-10-CM

## 2022-01-08 DIAGNOSIS — J069 Acute upper respiratory infection, unspecified: Secondary | ICD-10-CM | POA: Insufficient documentation

## 2022-01-08 DIAGNOSIS — Z1152 Encounter for screening for COVID-19: Secondary | ICD-10-CM | POA: Insufficient documentation

## 2022-01-08 DIAGNOSIS — M25511 Pain in right shoulder: Secondary | ICD-10-CM | POA: Diagnosis not present

## 2022-01-08 DIAGNOSIS — R079 Chest pain, unspecified: Secondary | ICD-10-CM

## 2022-01-08 MED ORDER — IBUPROFEN 600 MG PO TABS: 600.0000 mg | ORAL_TABLET | Freq: Three times a day (TID) | ORAL | 0 refills | Status: DC

## 2022-01-08 NOTE — Discharge Instructions (Addendum)
COVID test will be completed in 48 hours or less.  If you do not get a call from this office that indicates the test is negative.  Log onto MyChart to view the test results when they post in 48 hours or less. Advised take ibuprofen 600 mg every 8 hours with food to help reduce the right shoulder pain and discomfort. Advised to follow-up with PCP or return to urgent care if symptoms fail to improve.

## 2022-01-08 NOTE — ED Provider Notes (Signed)
[facility]    CSN: [id number] Arrival date & time: 01/08/22  1639      History   Chief Complaint Chief Complaint  Patient presents with   Generalized Body Aches   Shoulder Pain    HPI [name] is a 71 y.o. female.   71 year old female presents with right shoulder pain and request COVID screening.  Patient indicates she has a history of having arthritis and over the past several days she has been having some right shoulder pain and discomfort.  She indicates that the pain is localized around the shoulder joint and is worse when she moves her shoulder and raises it above her head.  Patient indicates she has not been taking any medicine to relieve the pain or OTC meds.  Patient denies any trauma to the shoulder, she is not having numbness or tingling, and no weakness. Patient also relates she has had some mild cough, congestion and upper respiratory symptoms with mild rhinitis which is mainly clear.  Patient desires to have a COVID test, although she relates she has not been around any family or friends that been sick.  Patient relates she has not have any fever, chills, but she has been having some mild body aches.   Shoulder Pain   Past Medical History:  Diagnosis Date   Abnormal EKG 2012   hospitalized for T wave inversion in lateral leads with MSK chest pains, normal ECHO, negative trops, no cardiology consult. recent EKG 7/15 stable T wave inversions.    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   blood clot in the kidney    Patient Active Problem List   Diagnosis Date Noted   Daytime somnolence 01/12/2021   Atypical chest pain 12/13/2020   Abnormal cardiac CT angiography 12/13/2020   Left elbow pain 03/31/2020   Contact dermatitis 01/14/2019   Vaginal  itching 12/05/2018   Pelvic pain 10/07/2018   Leg edema 08/28/2018   Thickened endometrium    Umbilical hernia without obstruction and without gangrene 01/01/2018   Osteopenia 09/05/2017   Closed nondisplaced spiral fracture of shaft of right tibia 03/28/2017   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   HLD (hyperlipidemia)    Aortic atherosclerosis ([place])    Back pain 07/08/2015   Scotoma of blind spot area in visual field 06/14/2015   Bilateral leg pain 06/14/2015   Seasonal allergies 03/09/2015   Hypopigmentation 03/09/2015   Osteoarthritis of left knee 07/08/2014   DJD (degenerative joint disease), lumbar 07/08/2014   Osteoarthritis of right knee 07/08/2014   Urinary frequency 06/11/2014   Healthcare maintenance 06/11/2014   Allergic rhinoconjunctivitis of both eyes 06/11/2014   Nearsightedness 04/27/2014   Abnormal EKG    Hypertension 12/11/2013    Past Surgical History:  Procedure Laterality Date   ABDOMINAL SURGERY  2010   for bowel blockage   COLON SURGERY  2010   bowel blockage   COLONOSCOPY  09/2014   HYSTEROSCOPY WITH D & C N/A 05/08/2018   Procedure: DILATATION AND CURETTAGE /HYSTEROSCOPY;  Surgeon: Mora Bellman, MD;  Location: [facility];  Service: Gynecology;  Laterality: N/A;   KNEE ARTHROSCOPY Left 09/25/2013   Procedure: LEFT KNEE ARTHROSCOPY WITH PARTIAL MEDIAL  AND LATERAL  MENISCECTOMY/DEBRIDEMENT/MICRO FRACTURE MEDIAL FEMORAL CONDYLE, REMOVAL OF OSTEOCONDRAL FRAGMENT;  Surgeon: Johnn Hai, MD;  Location: [facility];  Service: Orthopedics;  Laterality: Left;   KNEE SURGERY Right 1999   LEFT HEART CATH AND CORONARY ANGIOGRAPHY N/A 12/13/2020   Procedure: LEFT HEART CATH AND CORONARY ANGIOGRAPHY;  Surgeon: Nelva Bush, MD;  Location: [facility];  Service: Cardiovascular;  Laterality: N/A;   POLYPECTOMY     TEE WITHOUT CARDIOVERSION N/A 10/04/2015   Procedure: TRANSESOPHAGEAL ECHOCARDIOGRAM (TEE);   Surgeon: Sueanne Margarita, MD;  Location: [facility];  Service: Cardiovascular;  Laterality: N/A;   TUBAL LIGATION  1978    OB History     Gravida  2   Para  2   Term      Preterm      AB      Living  2      SAB      IAB      Ectopic      Multiple      Live Births               Home Medications    Prior to Admission medications   Medication Sig Start Date End Date Taking? Authorizing Provider  ibuprofen (ADVIL) 600 MG tablet Take 1 tablet (600 mg total) by mouth 3 (three) times daily. 01/08/22  Yes Nyoka Lint, PA-C  amLODipine (NORVASC) 5 MG tablet TAKE 1 TABLET(5 MG) BY MOUTH DAILY 12/07/21   Alen Bleacher, MD  aspirin EC 81 MG tablet Take 1 tablet (81 mg total) daily by mouth. 01/31/17   Clent Demark, PA-C  atorvastatin (LIPITOR) 80 MG tablet TAKE 1 TABLET(80 MG) BY MOUTH DAILY AT 6 PM 11/15/21   Donato Heinz, MD  carvedilol (COREG) 12.5 MG tablet Take 12.5 mg by mouth 2 (two) times daily. 09/02/21   [provider]  diclofenac Sodium (VOLTAREN) 1 % GEL Apply 2 g topically 4 (four) times daily. 12/06/21   Alen Bleacher, MD  ezetimibe (ZETIA) 10 MG tablet Take by mouth. 10/26/21   [provider]  fluticasone (FLONASE) 50 MCG/ACT nasal spray SHAKE LIQUID AND USE 2 SPRAYS IN EACH NOSTRIL TWICE DAILY 11/14/21   Alen Bleacher, MD  furosemide (LASIX) 40 MG tablet Take 1 tablet (40 mg total) by mouth daily. 12/13/20 12/28/21  End, Harrell Gave, MD  gabapentin (NEURONTIN) 300 MG capsule Take 300 mg by mouth 2 (two) times daily. 11/14/19   [provider]  isosorbide mononitrate (IMDUR) 60 MG 24 hr tablet Take 1 tablet (60 mg total) by mouth daily. 07/27/75   Delora Fuel, MD  lidocaine (HM LIDOCAINE PATCH) 4 % Place 1 patch onto the skin daily. 12/06/21   Alen Bleacher, MD  losartan (COZAAR) 100 MG tablet Take 1 tablet (100 mg total) by mouth daily. 12/29/20   Donato Heinz, MD  meloxicam (MOBIC) 15 MG tablet Take 1 tablet (15 mg  total) by mouth daily. 12/07/21   Enrique Sack, FNP  methylPREDNISolone (MEDROL DOSEPAK) 4 MG TBPK tablet Take as directed 12/07/21   Enrique Sack, FNP  Multiple Vitamin (MULITIVITAMIN WITH MINERALS) TABS Take 1 tablet by mouth daily.    [provider]  nitroGLYCERIN (NITROSTAT) 0.4 MG SL tablet Place 1 tablet (0.4 mg total) under the tongue every 5 (five) minutes as needed. 04/28/21 12/28/21  Donato Heinz, MD  ondansetron (ZOFRAN-ODT) 4 MG disintegrating tablet Take 1 tablet (4 mg total) by mouth every 8 (eight) hours as  needed for nausea or vomiting. Patient not taking: Reported on 12/28/2021 10/20/21   Barrett Henle, MD  potassium chloride SA (KLOR-CON M) 20 MEQ tablet Take 1 tablet (20 mEq total) by mouth 2 (two) times daily. 07/18/51   Delora Fuel, MD    Family History Family History  Problem Relation Age of Onset   Hypertension Mother    Heart disease Mother        has a pacemaker    Alzheimer's disease Mother    Hyperlipidemia Mother    Osteoporosis Mother    Heart failure Father    Hypertension Father    Alzheimer's disease Father    Hypertension Sister    Breast cancer Sister    Hypertension Sister    Gout Sister    Heart disease Son    Alcohol abuse Son    Pancreatitis Son    Hypertension Son    Cancer Neg Hx    Colon cancer Neg Hx    Esophageal cancer Neg Hx    Stomach cancer Neg Hx    Rectal cancer Neg Hx     Social History Social History   Tobacco Use   Smoking status: Former    Types: Cigarettes    Quit date: 07/29/2010    Years since quitting: 11.4    Passive exposure: Past   Smokeless tobacco: Never  Vaping Use   Vaping Use: Never used  Substance Use Topics   Alcohol use: Not Currently   Drug use: Never     Allergies   Shrimp [shellfish allergy], Latex, and Penicillins   Review of Systems Review of Systems  Musculoskeletal:  Positive for joint swelling (right shoulder pain).     Physical Exam Triage Vital  Signs ED Triage Vitals  Enc Vitals Group     BP 01/08/22 1719 (!) 143/46     Pulse Rate 01/08/22 1719 67     Resp 01/08/22 1719 18     Temp 01/08/22 1719 97.9 F (36.6 C)     Temp Source 01/08/22 1719 Oral     SpO2 01/08/22 1719 99 %     Weight --      Height --      Head Circumference --      Peak Flow --      Pain Score 01/08/22 1720 10     Pain Loc --      Pain Edu? --      Excl. in [place]? --    No data found.  Updated Vital Signs BP (!) 143/46 (BP Location: Right Arm)   Pulse 67   Temp 97.9 F (36.6 C) (Oral)   Resp 18   SpO2 99%   Visual Acuity Right Eye Distance:   Left Eye Distance:   Bilateral Distance:    Right Eye Near:   Left Eye Near:    Bilateral Near:     Physical Exam Constitutional:      Appearance: Normal appearance.  HENT:     Right Ear: Tympanic membrane and ear canal normal.     Left Ear: Tympanic membrane and ear canal normal.     Mouth/Throat:     Mouth: Mucous membranes are moist.     Pharynx: Oropharynx is clear.  Cardiovascular:     Rate and Rhythm: Normal rate and regular rhythm.     Heart sounds: Normal heart sounds.  Pulmonary:     Effort: Pulmonary effort is normal.     Breath sounds: Normal breath sounds and  air entry. No wheezing, rhonchi or rales.  Musculoskeletal:       Arms:     Comments: Right shoulder: Pain is palpated along the right shoulder joint line, no unusual redness or swelling.  Pain occurs when the patient raises her shoulder greater than 90 degrees over her head.  There is mild pain with resistance abduction.  Range of motion is normal, stability is intact, strength is normal.  Lymphadenopathy:     Cervical: No cervical adenopathy.  Neurological:     Mental Status: She is alert.      UC Treatments / Results  Labs (all labs ordered are listed, but only abnormal results are displayed) Labs Reviewed  SARS CORONAVIRUS 2 (TAT 6-24 HRS)    EKG   Radiology No results found.  Procedures Procedures  (including critical care time)  Medications Ordered in UC Medications - No data to display  Initial Impression / Assessment and Plan / UC Course  I have reviewed the triage vital signs and the nursing notes.  Pertinent labs & imaging results that were available during my care of the patient were reviewed by me and considered in my medical decision making (see chart for details).    Plan: 1. the acute shoulder pain will be treated with the following: A.  Ibuprofen 600 mg every 8 hours to treat the shoulder pain. 2.  The acute upper respiratory infection will be treated with the following: A.  Advised patient to take Mucinex OTC for congestion. B.  Patient requested COVID test, COVID test is pending. 3.  Screening for COVID will be treated with the following: A.  Because the patient is having some upper respiratory symptoms with congestion, cough, body aches and requested a COVID test.  COVID test is pending. 4.  Patient advised to follow-up PCP or return to urgent care if symptoms fail to improve. Final Clinical Impressions(s) / UC Diagnoses   Final diagnoses:  Acute pain of right shoulder  Acute upper respiratory infection  Encounter for screening for COVID-19     Discharge Instructions      COVID test will be completed in 48 hours or less.  If you do not get a call from this office that indicates the test is negative.  Log onto MyChart to view the test results when they post in 48 hours or less. Advised take ibuprofen 600 mg every 8 hours with food to help reduce the right shoulder pain and discomfort. Advised to follow-up with PCP or return to urgent care if symptoms fail to improve.    ED Prescriptions     Medication Sig Dispense Auth. Provider   ibuprofen (ADVIL) 600 MG tablet Take 1 tablet (600 mg total) by mouth 3 (three) times daily. 30 tablet Nyoka Lint, PA-C      PDMP not reviewed this encounter.   Nyoka Lint, PA-C 01/08/22 1735

## 2022-01-08 NOTE — ED Triage Notes (Signed)
Pt reports bilateral shoulder pain and generalized body aches x 1 day. States feels like flu symptoms. Requesting covid test.

## 2022-01-09 LAB — SARS CORONAVIRUS 2 (TAT 6-24 HRS): SARS Coronavirus 2: NEGATIVE

## 2022-01-12 ENCOUNTER — Telehealth: Payer: Self-pay | Admitting: *Deleted

## 2022-01-12 NOTE — Telephone Encounter (Signed)
-----   Message from Ermelinda Das sent at 12/23/2021  4:50 PM EDT ----- Regarding: insurance Contact: [phone] This patient called stating she had a monitor and they sent her a bill and it has the wrong insurance company on it.  It has [employer] as her insurance, she stated she never had [place] as her insurance.     She stated she has [employer]. She tried calling the monitor company three times to give them the correct insurance information, but they didn't answer.    Patient can be reached at [phone]. Thanks

## 2022-01-12 NOTE — Telephone Encounter (Signed)
Monitor company has EMR integration with [employer] records.  Insurance information provided was in the patients chart.  Investigated and contacted [employer] [facility] with correct information.

## 2022-01-21 ENCOUNTER — Encounter (HOSPITAL_COMMUNITY): Payer: Self-pay | Admitting: Emergency Medicine

## 2022-01-21 ENCOUNTER — Ambulatory Visit (HOSPITAL_COMMUNITY)
Admission: EM | Admit: 2022-01-21 | Discharge: 2022-01-21 | Disposition: A | Discharge status: Home / Self Care | Payer: Medicare Other | Attending: Physician Assistant | Admitting: Physician Assistant

## 2022-01-21 DIAGNOSIS — M25511 Pain in right shoulder: Secondary | ICD-10-CM | POA: Insufficient documentation

## 2022-01-21 DIAGNOSIS — R051 Acute cough: Secondary | ICD-10-CM

## 2022-01-21 DIAGNOSIS — M25562 Pain in left knee: Secondary | ICD-10-CM | POA: Diagnosis not present

## 2022-01-21 DIAGNOSIS — M25512 Pain in left shoulder: Secondary | ICD-10-CM | POA: Insufficient documentation

## 2022-01-21 DIAGNOSIS — Z1152 Encounter for screening for COVID-19: Secondary | ICD-10-CM | POA: Diagnosis not present

## 2022-01-21 MED ORDER — IBUPROFEN 600 MG PO TABS: 600.0000 mg | ORAL_TABLET | Freq: Three times a day (TID) | ORAL | 0 refills | Status: DC

## 2022-01-21 NOTE — ED Triage Notes (Signed)
Pt reports bilateral shoulder pain, cough and bilateral knee pain x 1 day. States feels like flu symptoms.

## 2022-01-21 NOTE — ED Provider Notes (Signed)
[name]    CSN: [id number] Arrival date & time: 01/21/22  1556      History   Chief Complaint Chief Complaint  Patient presents with   Shoulder Pain   Knee Pain   Cough    HPI [name] is a 71 y.o. female.   71 year old female presents with cough and shoulder pain along with left knee pain.  Patient indicates for the past week she has been having chest congestion and cough, with production being clear.  Patient relates she has not have any fever, chills, nausea or vomiting.  Patient indicates she does have some upper respiratory congestion with postnasal drip and rhinitis mainly been clear.  Patient is requesting a COVID test today.  Patient also indicates that she is having continued bilateral shoulder pain and discomfort that is worse with overhead reaching.  She denies injuring her shoulders, weakness, numbness or tingling.  Patient does relate that she gets relief with ibuprofen.  She also indicates she is having some left knee pain that is worse with range of motion.  She has increased pain when she walks.  No history of trauma to the area.  Patient indicates she does have an appointment to see her orthopedist but is not for another month.   Shoulder Pain Knee Pain Cough   Past Medical History:  Diagnosis Date   Abnormal EKG 2012   hospitalized for T wave inversion in lateral leads with MSK chest pains, normal ECHO, negative trops, no cardiology consult. recent EKG 7/15 stable T wave inversions.    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   blood clot in the kidney    Patient Active Problem List   Diagnosis Date Noted   Daytime somnolence 01/12/2021   Atypical chest pain 12/13/2020   Abnormal cardiac CT angiography 12/13/2020   Left elbow pain  03/31/2020   Contact dermatitis 01/14/2019   Vaginal itching 12/05/2018   Pelvic pain 10/07/2018   Leg edema 08/28/2018   Thickened endometrium    Umbilical hernia without obstruction and without gangrene 01/01/2018   Osteopenia 09/05/2017   Closed nondisplaced spiral fracture of shaft of right tibia 03/28/2017   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   HLD (hyperlipidemia)    Aortic atherosclerosis ([place])    Back pain 07/08/2015   Scotoma of blind spot area in visual field 06/14/2015   Bilateral leg pain 06/14/2015   Seasonal allergies 03/09/2015   Hypopigmentation 03/09/2015   Osteoarthritis of left knee 07/08/2014   DJD (degenerative joint disease), lumbar 07/08/2014   Osteoarthritis of right knee 07/08/2014   Urinary frequency 06/11/2014   Healthcare maintenance 06/11/2014   Allergic rhinoconjunctivitis of both eyes 06/11/2014   Nearsightedness 04/27/2014   Abnormal EKG    Hypertension 12/11/2013    Past Surgical History:  Procedure Laterality Date   ABDOMINAL SURGERY  2010   for bowel blockage   COLON SURGERY  2010   bowel blockage   COLONOSCOPY  09/2014   HYSTEROSCOPY WITH D & C N/A 05/08/2018   Procedure: DILATATION AND CURETTAGE /HYSTEROSCOPY;  Surgeon: Mora Bellman, MD;  Location: [facility];  Service: Gynecology;  Laterality: N/A;   KNEE ARTHROSCOPY Left 09/25/2013  Procedure: LEFT KNEE ARTHROSCOPY WITH PARTIAL MEDIAL AND LATERAL  MENISCECTOMY/DEBRIDEMENT/MICRO FRACTURE MEDIAL FEMORAL CONDYLE, REMOVAL OF OSTEOCONDRAL FRAGMENT;  Surgeon: Johnn Hai, MD;  Location: [facility];  Service: Orthopedics;  Laterality: Left;   KNEE SURGERY Right 1999   LEFT HEART CATH AND CORONARY ANGIOGRAPHY N/A 12/13/2020   Procedure: LEFT HEART CATH AND CORONARY ANGIOGRAPHY;  Surgeon: Nelva Bush, MD;  Location: [facility];  Service: Cardiovascular;  Laterality: N/A;   POLYPECTOMY     TEE WITHOUT CARDIOVERSION N/A 10/04/2015    Procedure: TRANSESOPHAGEAL ECHOCARDIOGRAM (TEE);  Surgeon: Sueanne Margarita, MD;  Location: [facility];  Service: Cardiovascular;  Laterality: N/A;   TUBAL LIGATION  1978    OB History     Gravida  2   Para  2   Term      Preterm      AB      Living  2      SAB      IAB      Ectopic      Multiple      Live Births               Home Medications    Prior to Admission medications   Medication Sig Start Date End Date Taking? Authorizing Provider  amLODipine (NORVASC) 5 MG tablet TAKE 1 TABLET(5 MG) BY MOUTH DAILY 12/07/21   Alen Bleacher, MD  aspirin EC 81 MG tablet Take 1 tablet (81 mg total) daily by mouth. 01/31/17   Clent Demark, PA-C  atorvastatin (LIPITOR) 80 MG tablet TAKE 1 TABLET(80 MG) BY MOUTH DAILY AT 6 PM 11/15/21   Donato Heinz, MD  carvedilol (COREG) 12.5 MG tablet Take 12.5 mg by mouth 2 (two) times daily. 09/02/21   [provider]  diclofenac Sodium (VOLTAREN) 1 % GEL Apply 2 g topically 4 (four) times daily. 12/06/21   Alen Bleacher, MD  ezetimibe (ZETIA) 10 MG tablet Take by mouth. 10/26/21   [provider]  fluticasone (FLONASE) 50 MCG/ACT nasal spray SHAKE LIQUID AND USE 2 SPRAYS IN EACH NOSTRIL TWICE DAILY 11/14/21   Alen Bleacher, MD  furosemide (LASIX) 40 MG tablet Take 1 tablet (40 mg total) by mouth daily. 12/13/20 12/28/21  End, Harrell Gave, MD  gabapentin (NEURONTIN) 300 MG capsule Take 300 mg by mouth 2 (two) times daily. 11/14/19   [provider]  ibuprofen (ADVIL) 600 MG tablet Take 1 tablet (600 mg total) by mouth 3 (three) times daily. 01/21/22   Nyoka Lint, PA-C  isosorbide mononitrate (IMDUR) 60 MG 24 hr tablet Take 1 tablet (60 mg total) by mouth daily. 11/05/55   Delora Fuel, MD  lidocaine (HM LIDOCAINE PATCH) 4 % Place 1 patch onto the skin daily. 12/06/21   Alen Bleacher, MD  losartan (COZAAR) 100 MG tablet Take 1 tablet (100 mg total) by mouth daily. 12/29/20   Donato Heinz, MD   meloxicam (MOBIC) 15 MG tablet Take 1 tablet (15 mg total) by mouth daily. 12/07/21   Enrique Sack, FNP  methylPREDNISolone (MEDROL DOSEPAK) 4 MG TBPK tablet Take as directed 12/07/21   Enrique Sack, FNP  Multiple Vitamin (MULITIVITAMIN WITH MINERALS) TABS Take 1 tablet by mouth daily.    [provider]  nitroGLYCERIN (NITROSTAT) 0.4 MG SL tablet PLACE 1 TABLET UNDER THE TONGUE EVERY 5 MINUTES AS NEEDED 01/09/22   Donato Heinz, MD  ondansetron (ZOFRAN-ODT) 4 MG disintegrating tablet Take 1 tablet (4 mg total) by mouth every 8 (  eight) hours as needed for nausea or vomiting. Patient not taking: Reported on 12/28/2021 10/20/21   Barrett Henle, MD  potassium chloride SA (KLOR-CON M) 20 MEQ tablet Take 1 tablet (20 mEq total) by mouth 2 (two) times daily. 11/24/49   Delora Fuel, MD    Family History Family History  Problem Relation Age of Onset   Hypertension Mother    Heart disease Mother        has a pacemaker    Alzheimer's disease Mother    Hyperlipidemia Mother    Osteoporosis Mother    Heart failure Father    Hypertension Father    Alzheimer's disease Father    Hypertension Sister    Breast cancer Sister    Hypertension Sister    Gout Sister    Heart disease Son    Alcohol abuse Son    Pancreatitis Son    Hypertension Son    Cancer Neg Hx    Colon cancer Neg Hx    Esophageal cancer Neg Hx    Stomach cancer Neg Hx    Rectal cancer Neg Hx     Social History Social History   Tobacco Use   Smoking status: Former    Types: Cigarettes    Quit date: 07/29/2010    Years since quitting: 11.4    Passive exposure: Past   Smokeless tobacco: Never  Vaping Use   Vaping Use: Never used  Substance Use Topics   Alcohol use: Not Currently   Drug use: Never     Allergies   Shrimp [shellfish allergy], Latex, and Penicillins   Review of Systems Review of Systems  Respiratory:  Positive for cough.   Musculoskeletal:  Positive for joint swelling  (left knee and shoulders).     Physical Exam Triage Vital Signs ED Triage Vitals  Enc Vitals Group     BP 01/21/22 1710 (!) 175/87     Pulse Rate 01/21/22 1710 69     Resp 01/21/22 1710 17     Temp 01/21/22 1710 98 F (36.7 C)     Temp Source 01/21/22 1710 Oral     SpO2 01/21/22 1710 96 %     Weight --      Height --      Head Circumference --      Peak Flow --      Pain Score 01/21/22 1709 7     Pain Loc --      Pain Edu? --      Excl. in [place]? --    No data found.  Updated Vital Signs BP (!) 175/87 (BP Location: Left Arm)   Pulse 69   Temp 98 F (36.7 C) (Oral)   Resp 17   SpO2 96%   Visual Acuity Right Eye Distance:   Left Eye Distance:   Bilateral Distance:    Right Eye Near:   Left Eye Near:    Bilateral Near:     Physical Exam Constitutional:      Appearance: Normal appearance.  HENT:     Right Ear: Tympanic membrane and ear canal normal.     Left Ear: Tympanic membrane and ear canal normal.     Mouth/Throat:     Mouth: Mucous membranes are moist.     Pharynx: Oropharynx is clear.  Cardiovascular:     Rate and Rhythm: Normal rate and regular rhythm.     Heart sounds: Normal heart sounds.  Pulmonary:     Effort: Pulmonary effort is  normal.     Breath sounds: Normal breath sounds and air entry. No wheezing, rhonchi or rales.  Musculoskeletal:     Comments: Shoulders: Full range of motion is normal, no unusual redness or swelling bilaterally. Tenderness palpated across the trapezius and joint line areas bilaterally.  No crepitus with motion.  Strength is normal bilaterally.  Left knee: Pain is palpated across the knee joint without any unusual redness or swelling.  Full range of motion is intact.  No crepitus with motion.  Strength is normal.  Lymphadenopathy:     Cervical: No cervical adenopathy.  Neurological:     Mental Status: She is alert.      UC Treatments / Results  Labs (all labs ordered are listed, but only abnormal results are  displayed) Labs Reviewed  SARS CORONAVIRUS 2 (TAT 6-24 HRS)    EKG   Radiology No results found.  Procedures Procedures (including critical care time)  Medications Ordered in UC Medications - No data to display  Initial Impression / Assessment and Plan / UC Course  I have reviewed the triage vital signs and the nursing notes.  Pertinent labs & imaging results that were available during my care of the patient were reviewed by me and considered in my medical decision making (see chart for details).    Plan: 1.  The acute pain of left knee will be treated with the following: A.  Ibuprofen 600 mg every 8 hours with food to help treat the pain. 2.  Pain of the shoulders will be treated with the following: A.  Ibuprofen 600 mg every 8 hours with food to help treat the pain for the next 10 days. 3.  Screening for COVID-19 will be treated with the following: A.  Treatment will be modified depending on the results of the COVID test. 4.  Patient advised to follow-up with orthopedist and return to urgent care as needed. Final Clinical Impressions(s) / UC Diagnoses   Final diagnoses:  Acute pain of left knee  Acute pain of both shoulders  Acute cough  Encounter for screening for COVID-19     Discharge Instructions      COVID test will be completed in 48 hours.  If you do not get a call from this office that indicates that it is negative.  Log onto MyChart to view the test results when it post in 48 hours. Is to take the ibuprofen 600 mg every 8 hours for the shoulder and knee pain. Advised to follow-up with the orthopedist to have the joint pain addressed.    ED Prescriptions     Medication Sig Dispense Auth. Provider   ibuprofen (ADVIL) 600 MG tablet Take 1 tablet (600 mg total) by mouth 3 (three) times daily. 30 tablet Nyoka Lint, PA-C      PDMP not reviewed this encounter.   Nyoka Lint, PA-C 01/21/22 1735

## 2022-01-21 NOTE — Discharge Instructions (Signed)
COVID test will be completed in 48 hours.  If you do not get a call from this office that indicates that it is negative.  Log onto MyChart to view the test results when it post in 48 hours. Is to take the ibuprofen 600 mg every 8 hours for the shoulder and knee pain. Advised to follow-up with the orthopedist to have the joint pain addressed.

## 2022-01-22 LAB — SARS CORONAVIRUS 2 (TAT 6-24 HRS): SARS Coronavirus 2: NEGATIVE

## 2022-01-26 DIAGNOSIS — Z20822 Contact with and (suspected) exposure to covid-19: Secondary | ICD-10-CM | POA: Diagnosis not present

## 2022-01-26 DIAGNOSIS — Z03818 Encounter for observation for suspected exposure to other biological agents ruled out: Secondary | ICD-10-CM | POA: Diagnosis not present

## 2022-01-26 DIAGNOSIS — I10 Essential (primary) hypertension: Secondary | ICD-10-CM | POA: Diagnosis not present

## 2022-02-01 ENCOUNTER — Ambulatory Visit: Payer: Medicare Other

## 2022-02-08 DIAGNOSIS — Z139 Encounter for screening, unspecified: Secondary | ICD-10-CM

## 2022-02-08 LAB — GLUCOSE, POCT (MANUAL RESULT ENTRY): POC Glucose: 107 mg/dl — AB (ref 70–99)

## 2022-02-10 ENCOUNTER — Other Ambulatory Visit: Payer: Self-pay | Admitting: Student

## 2022-02-10 DIAGNOSIS — Z1231 Encounter for screening mammogram for malignant neoplasm of breast: Secondary | ICD-10-CM

## 2022-02-13 ENCOUNTER — Other Ambulatory Visit: Payer: Self-pay | Admitting: Student

## 2022-02-13 ENCOUNTER — Other Ambulatory Visit: Payer: Self-pay | Admitting: Sports Medicine

## 2022-02-13 NOTE — Congregational Nurse Program (Signed)
  Dept: [phone]   Congregational Nurse Program Note  Date of Encounter: 02/13/2022  Past Medical History: Past Medical History:  Diagnosis Date   Abnormal EKG 2012   hospitalized for T wave inversion in lateral leads with MSK chest pains, normal ECHO, negative trops, no cardiology consult. recent EKG 7/15 stable T wave inversions.    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 Yes    Student Assistance N/A    Location Patient [facility]    Visit Setting with Client Phone/Text/Email    Patient Status Unknown    Insurance Medicare    Insurance/Financial Assistance Referral N/A    Medication N/A    Medical Provider Yes    Screening Referrals Made N/A    Medical Referrals Made N/A    Medical Appointment Made N/A    Recently w/o PCP, now 1st time PCP visit completed due to CNs referral or appointment made N/A    Food N/A    Transportation N/A    Housing/Utilities N/A    Interpersonal Safety N/A    Interventions Spiritual Care    Abnormal to Normal Screening Since Last CN Visit N/A    Screenings CN Performed N/A    Sent Client to Lab for: N/A    Did client attend any of the following based off CNs referral or appointments made? N/A    ED Visit Averted N/A    Life-Saving Intervention Made N/A             follow up call made to patient. Spiritual care given.

## 2022-02-13 NOTE — Congregational Nurse Program (Signed)
  Dept: [phone]   Congregational Nurse Program Note  Date of Encounter: 02/08/2022  Past Medical History: Past Medical History:  Diagnosis Date   Abnormal EKG 2012   hospitalized for T wave inversion in lateral leads with MSK chest pains, normal ECHO, negative trops, no cardiology consult. recent EKG 7/15 stable T wave inversions.    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 Yes    Student Assistance N/A    Location Patient [facility]    Visit Setting with Client [place]    Patient Status Unknown    Insurance Medicare    Insurance/Financial Assistance Referral N/A    Medication N/A    Medical Provider Yes    Screening Referrals Made N/A    Medical Referrals Made N/A    Medical Appointment Made N/A    Recently w/o PCP, now 1st time PCP visit completed due to CNs referral or appointment made N/A    Food N/A    Transportation N/A    Housing/Utilities N/A    Interpersonal Safety N/A    Interventions Counsel;Educate;Spiritual Care    Abnormal to Normal Screening Since Last CN Visit Blood Pressure    Screenings CN Performed Blood Pressure;Blood Glucose;Weight;Temperature;Pulse Ox    Sent Client to Lab for: N/A    Did client attend any of the following based off CNs referral or appointments made? N/A    ED Visit Averted N/A    Life-Saving Intervention Made N/A            patient vital signs obtained and blood glucose. Discuss the progress towards goals previously made. Patient stated she is progressing towards goal as she is performing her knee exercises daily . Patient also stated  she was making dietary cut backs including chips and bread. Spiritual care provided. Patient stated she will follow up in the next couple weeks. Patient has established a goal of some weight loss over the next couple of months.

## 2022-02-14 ENCOUNTER — Other Ambulatory Visit: Payer: Self-pay | Admitting: Cardiology

## 2022-02-17 ENCOUNTER — Ambulatory Visit (HOSPITAL_COMMUNITY)
Admission: EM | Admit: 2022-02-17 | Discharge: 2022-02-17 | Disposition: A | Discharge status: Home / Self Care | Payer: Medicare Other | Attending: Nurse Practitioner | Admitting: Nurse Practitioner

## 2022-02-17 ENCOUNTER — Encounter (HOSPITAL_COMMUNITY): Payer: Self-pay

## 2022-02-17 DIAGNOSIS — L210 Seborrhea capitis: Secondary | ICD-10-CM | POA: Diagnosis not present

## 2022-02-17 DIAGNOSIS — N760 Acute vaginitis: Secondary | ICD-10-CM | POA: Diagnosis present

## 2022-02-17 DIAGNOSIS — R300 Dysuria: Secondary | ICD-10-CM | POA: Diagnosis not present

## 2022-02-17 LAB — POCT URINALYSIS DIPSTICK, ED / UC
Bilirubin Urine: NEGATIVE
Glucose, UA: NEGATIVE mg/dL
Ketones, ur: NEGATIVE mg/dL
Leukocytes,Ua: NEGATIVE
Nitrite: NEGATIVE
Protein, ur: NEGATIVE mg/dL
Specific Gravity, Urine: 1.01 (ref 1.005–1.030)
Urobilinogen, UA: 0.2 mg/dL (ref 0.0–1.0)
pH: 6 (ref 5.0–8.0)

## 2022-02-17 MED ORDER — PHENAZOPYRIDINE HCL 200 MG PO TABS: 200.0000 mg | ORAL_TABLET | Freq: Three times a day (TID) | ORAL | 0 refills | Status: AC | PRN

## 2022-02-17 MED ORDER — FLUCONAZOLE 150 MG PO TABS: 150.0000 mg | ORAL_TABLET | Freq: Every day | ORAL | 0 refills | Status: DC

## 2022-02-17 NOTE — ED Provider Notes (Signed)
[facility]    CSN: [id number] Arrival date & time: 02/17/22  1442      History   Chief Complaint Chief Complaint  Patient presents with   Vaginal Itching   Dysuria   Hair/Scalp Problem    HPI [name] is a 71 y.o. female for evaluation of vaginal discharge and dysuria.  Patient reports 1 week of vaginal itching without discharge but does endorse a malodorous smell.  Also endorses urinary burning but denies urgency or frequency.  No fevers, nausea/vomiting, flank pain.  Denies history of recurrent UTIs or vaginal infections.  She has a yeast infection in the past and states her current symptoms feel similar.  She has used OTC Monistat without improvement.  Denies any recent antibiotic usage.  No known STD concern or exposure but she would like to be checked for gonorrhea chlamydia.  In addition she reports itching of her scalp for the past several months.  Uses an over-the-counter shampoo such as Dove.  No other concerns at this time.   Vaginal Itching  Dysuria   Past Medical History:  Diagnosis Date   Abnormal EKG 2012   hospitalized for T wave inversion in lateral leads with MSK chest pains, normal ECHO, negative trops, no cardiology consult. recent EKG 7/15 stable T wave inversions.    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   blood clot in the kidney    Patient Active Problem List   Diagnosis Date Noted   Daytime somnolence 01/12/2021   Atypical chest pain 12/13/2020   Abnormal cardiac CT angiography 12/13/2020   Left elbow pain 03/31/2020   Contact dermatitis 01/14/2019   Vaginal itching 12/05/2018   Pelvic pain 10/07/2018   Leg edema 08/28/2018   Thickened endometrium    Umbilical hernia without obstruction and without gangrene 01/01/2018    Osteopenia 09/05/2017   Closed nondisplaced spiral fracture of shaft of right tibia 03/28/2017   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   Renal infarct ([place]) 09/30/2015   HLD (hyperlipidemia)    Aortic atherosclerosis ([place])    Back pain 07/08/2015   Scotoma of blind spot area in visual field 06/14/2015   Bilateral leg pain 06/14/2015   Seasonal allergies 03/09/2015   Hypopigmentation 03/09/2015   Osteoarthritis of left knee 07/08/2014   DJD (degenerative joint disease), lumbar 07/08/2014   Osteoarthritis of right knee 07/08/2014   Urinary frequency 06/11/2014   Healthcare maintenance 06/11/2014   Allergic rhinoconjunctivitis of both eyes 06/11/2014   Nearsightedness 04/27/2014   Abnormal EKG    Hypertension 12/11/2013    Past Surgical History:  Procedure Laterality Date   ABDOMINAL SURGERY  2010   for bowel blockage   COLON SURGERY  2010   bowel blockage   COLONOSCOPY  09/2014   HYSTEROSCOPY WITH D & C N/A 05/08/2018   Procedure: DILATATION AND CURETTAGE /HYSTEROSCOPY;  Surgeon: Mora Bellman, MD;  Location: [facility];  Service: Gynecology;  Laterality: N/A;   KNEE ARTHROSCOPY Left 09/25/2013   Procedure: LEFT KNEE ARTHROSCOPY WITH PARTIAL MEDIAL AND LATERAL  MENISCECTOMY/DEBRIDEMENT/MICRO FRACTURE MEDIAL FEMORAL CONDYLE, REMOVAL OF OSTEOCONDRAL FRAGMENT;  Surgeon: Johnn Hai, MD;  Location: [facility];  Service: Orthopedics;  Laterality: Left;   KNEE SURGERY Right 1999  LEFT HEART CATH AND CORONARY ANGIOGRAPHY N/A 12/13/2020   Procedure: LEFT HEART CATH AND CORONARY ANGIOGRAPHY;  Surgeon: Nelva Bush, MD;  Location: [facility];  Service: Cardiovascular;  Laterality: N/A;   POLYPECTOMY     TEE WITHOUT CARDIOVERSION N/A 10/04/2015   Procedure: TRANSESOPHAGEAL ECHOCARDIOGRAM (TEE);  Surgeon: Sueanne Margarita, MD;  Location[facility];  Service: Cardiovascular;  Laterality: N/A;   TUBAL LIGATION  1978    OB History     Gravida  2   Para  2   Term       Preterm      AB      Living  2      SAB      IAB      Ectopic      Multiple      Live Births               Home Medications    Prior to Admission medications   Medication Sig Start Date End Date Taking? Authorizing Provider  amLODipine (NORVASC) 5 MG tablet TAKE 1 TABLET(5 MG) BY MOUTH DAILY 12/07/21  Yes Alen Bleacher, MD  aspirin EC 81 MG tablet Take 1 tablet (81 mg total) daily by mouth. 01/31/17  Yes Clent Demark, PA-C  atorvastatin (LIPITOR) 80 MG tablet TAKE 1 TABLET(80 MG) BY MOUTH DAILY AT 6 PM 11/15/21  Yes Donato Heinz, MD  carvedilol (COREG) 12.5 MG tablet Take 12.5 mg by mouth 2 (two) times daily. 09/02/21  Yes [provider]  diclofenac Sodium (VOLTAREN) 1 % GEL Apply 2 g topically 4 (four) times daily. 12/06/21  Yes Alen Bleacher, MD  ezetimibe (ZETIA) 10 MG tablet Take by mouth. 10/26/21  Yes [provider]  fluconazole (DIFLUCAN) 150 MG tablet Take 1 tablet (150 mg total) by mouth daily. 02/17/22  Yes Efrain Sella R, NP  fluticasone (FLONASE) 50 MCG/ACT nasal spray SHAKE LIQUID AND USE 2 SPRAYS IN EACH NOSTRIL TWICE DAILY 02/13/22  Yes Alen Bleacher, MD  gabapentin (NEURONTIN) 300 MG capsule Take 300 mg by mouth 2 (two) times daily. 11/14/19  Yes [provider]  ibuprofen (ADVIL) 600 MG tablet Take 1 tablet (600 mg total) by mouth 3 (three) times daily. 01/21/22  Yes Nyoka Lint, PA-C  isosorbide mononitrate (IMDUR) 60 MG 24 hr tablet Take 1 tablet (60 mg total) by mouth daily. 07/29/37  Yes Delora Fuel, MD  lidocaine (HM LIDOCAINE PATCH) 4 % Place 1 patch onto the skin daily. 12/06/21  Yes Alen Bleacher, MD  losartan (COZAAR) 100 MG tablet TAKE 1 TABLET(100 MG) BY MOUTH DAILY 02/15/22  Yes Donato Heinz, MD  meloxicam (MOBIC) 15 MG tablet Take 1 tablet (15 mg total) by mouth daily. 12/07/21  Yes Enrique Sack, FNP  methylPREDNISolone (MEDROL DOSEPAK) 4 MG TBPK tablet Take as directed 12/07/21  Yes  Murrill, Aldona Bar, FNP  Multiple Vitamin (MULITIVITAMIN WITH MINERALS) TABS Take 1 tablet by mouth daily.   Yes [provider]  nitroGLYCERIN (NITROSTAT) 0.4 MG SL tablet PLACE 1 TABLET UNDER THE TONGUE EVERY 5 MINUTES AS NEEDED 01/09/22  Yes Donato Heinz, MD  ondansetron (ZOFRAN-ODT) 4 MG disintegrating tablet Take 1 tablet (4 mg total) by mouth every 8 (eight) hours as needed for nausea or vomiting. 10/20/21  Yes Banister, Gwenlyn Perking, MD  phenazopyridine (PYRIDIUM) 200 MG tablet Take 1 tablet (200 mg total) by mouth 3 (three) times daily as needed for up to 2 days for pain. 02/17/22 02/19/22  Yes Efrain Sella R, NP  potassium chloride SA (KLOR-CON M) 20 MEQ tablet Take 1 tablet (20 mEq total) by mouth 2 (two) times daily. 2/42/35  Yes Delora Fuel, MD  furosemide (LASIX) 40 MG tablet Take 1 tablet (40 mg total) by mouth daily. 12/13/20 12/28/21  End, Harrell Gave, MD    Family History Family History  Problem Relation Age of Onset   Hypertension Mother    Heart disease Mother        has a pacemaker    Alzheimer's disease Mother    Hyperlipidemia Mother    Osteoporosis Mother    Heart failure Father    Hypertension Father    Alzheimer's disease Father    Hypertension Sister    Breast cancer Sister    Hypertension Sister    Gout Sister    Heart disease Son    Alcohol abuse Son    Pancreatitis Son    Hypertension Son    Cancer Neg Hx    Colon cancer Neg Hx    Esophageal cancer Neg Hx    Stomach cancer Neg Hx    Rectal cancer Neg Hx     Social History Social History   Tobacco Use   Smoking status: Former    Types: Cigarettes    Quit date: 07/29/2010    Years since quitting: 11.5    Passive exposure: Past   Smokeless tobacco: Never  Vaping Use   Vaping Use: Never used  Substance Use Topics   Alcohol use: Not Currently   Drug use: Never     Allergies   Shrimp [shellfish allergy], Latex, and Penicillins   Review of Systems Review of Systems  HENT:          Scalp itching  Genitourinary:  Positive for dysuria.       Vaginal itching     Physical Exam Triage Vital Signs ED Triage Vitals  Enc Vitals Group     BP 02/17/22 1613 (!) 151/71     Pulse Rate 02/17/22 1613 77     Resp 02/17/22 1613 16     Temp 02/17/22 1613 98.3 F (36.8 C)     Temp Source 02/17/22 1613 Oral     SpO2 02/17/22 1613 95 %     Weight --      Height --      Head Circumference --      Peak Flow --      Pain Score 02/17/22 1612 8     Pain Loc --      Pain Edu? --      Excl. in [place]? --    No data found.  Updated Vital Signs BP (!) 151/71 (BP Location: Right Arm)   Pulse 77   Temp 98.3 F (36.8 C) (Oral)   Resp 16   SpO2 95%   Visual Acuity Right Eye Distance:   Left Eye Distance:   Bilateral Distance:    Right Eye Near:   Left Eye Near:    Bilateral Near:     Physical Exam Vitals and nursing note reviewed.  Constitutional:      Appearance: Normal appearance.  HENT:     Head: Normocephalic and atraumatic.     Comments: Scattered dandruff to scalp.  No swelling, erythema, drainage, vesicles. Eyes:     Pupils: Pupils are equal, round, and reactive to light.  Cardiovascular:     Rate and Rhythm: Normal rate.  Pulmonary:     Effort: Pulmonary effort is normal.  Skin:    General: Skin is warm and dry.  Neurological:     General: No focal deficit present.     Mental Status: She is alert and oriented to person, place, and time.  Psychiatric:        Mood and Affect: Mood normal.        Behavior: Behavior normal.      UC Treatments / Results  Labs (all labs ordered are listed, but only abnormal results are displayed) Labs Reviewed  POCT URINALYSIS DIPSTICK, ED / UC - Abnormal; Notable for the following components:      Result Value   Hgb urine dipstick TRACE (*)    All other components within normal limits  URINE CULTURE  CERVICOVAGINAL ANCILLARY ONLY    EKG   Radiology No results found.  Procedures Procedures (including  critical care time)  Medications Ordered in UC Medications - No data to display  Initial Impression / Assessment and Plan / UC Course  I have reviewed the triage vital signs and the nursing notes.  Pertinent labs & imaging results that were available during my care of the patient were reviewed by me and considered in my medical decision making (see chart for details).     Start Diflucan.  Vaginal swab sent and we will contact patient if results positive UA with trace blood, will culture. Pyridium PRN. Increase fluids Advised over-the-counter dandruff shampoo such as Selsun Blue or head and shoulders Follow up with PCP in 2-3 days for re-check  ER precautions reviewed and pt verbalized understanding   Final Clinical Impressions(s) / UC Diagnoses   Final diagnoses:  Acute vaginitis  Dysuria  Dandruff     Discharge Instructions      Diflucan as prescribed Pyridium for urinary symptoms. This will make your urine orange.  The clinic will contact you if the results of your vaginal swab and urine culture are positive Use an over-the-counter dandruff shampoo such as Selsun Blue or head and shoulders Follow-up with your PCP 2 to 3 days for recheck Please go to the emergency room for any worsening symptoms      ED Prescriptions     Medication Sig Dispense Auth. Provider   fluconazole (DIFLUCAN) 150 MG tablet Take 1 tablet (150 mg total) by mouth daily. 1 tablet Efrain Sella R, NP   phenazopyridine (PYRIDIUM) 200 MG tablet Take 1 tablet (200 mg total) by mouth 3 (three) times daily as needed for up to 2 days for pain. 6 tablet Ardelle Balls, NP      PDMP not reviewed this encounter.   Ardelle Balls, NP 02/17/22 [phone]

## 2022-02-17 NOTE — ED Triage Notes (Signed)
Chief Complaint: Vaginal itching, scalp itching burns when urination.   Onset: pelvic complaints 1 week. Scalp 6 months.   OTC medications tried: Yes- Monostat    with no relief  Sick exposure: No  New foods or medications: No

## 2022-02-17 NOTE — Discharge Instructions (Addendum)
Diflucan as prescribed Pyridium for urinary symptoms. This will make your urine orange.  The clinic will contact you if the results of your vaginal swab and urine culture are positive Use an over-the-counter dandruff shampoo such as Selsun Blue or head and shoulders Follow-up with your PCP 2 to 3 days for recheck Please go to the emergency room for any worsening symptoms

## 2022-02-18 LAB — URINE CULTURE: Culture: <10000 — AB

## 2022-02-19 ENCOUNTER — Other Ambulatory Visit: Payer: Self-pay | Admitting: Family Medicine

## 2022-02-20 ENCOUNTER — Telehealth (HOSPITAL_COMMUNITY): Payer: Self-pay | Admitting: *Deleted

## 2022-02-20 LAB — CERVICOVAGINAL ANCILLARY ONLY
Bacterial Vaginitis (gardnerella): NEGATIVE
Candida Glabrata: POSITIVE — AB
Candida Vaginitis: NEGATIVE
Chlamydia: NEGATIVE
Comment: NEGATIVE
Comment: NEGATIVE
Comment: NEGATIVE
Comment: NEGATIVE
Comment: NEGATIVE
Comment: NORMAL
Neisseria Gonorrhea: NEGATIVE
Trichomonas: NEGATIVE

## 2022-02-20 MED ORDER — FLUCONAZOLE 200 MG PO TABS: 100.0000 mg | ORAL_TABLET | Freq: Every day | ORAL | 0 refills | Status: AC

## 2022-02-20 NOTE — Telephone Encounter (Signed)
Patient reports no relief with diflucan x 1 tab taken on Friday. Spoke with Dr Windy Carina regarding itching and burning patient is having. Will prescribed diflucan '100mg'$  tab PO x 7 days. Results reviewed with patient and questions answered.

## 2022-02-22 ENCOUNTER — Telehealth: Payer: Self-pay | Admitting: Cardiology

## 2022-02-22 ENCOUNTER — Telehealth: Payer: Self-pay | Admitting: Internal Medicine

## 2022-02-22 MED ORDER — FUROSEMIDE 40 MG PO TABS: 40.0000 mg | ORAL_TABLET | Freq: Every day | ORAL | 9 refills | Status: DC

## 2022-02-22 NOTE — Telephone Encounter (Signed)
Error

## 2022-02-22 NOTE — Telephone Encounter (Signed)
*  STAT* If patient is at the pharmacy, call can be transferred to refill team.   1. Which medications need to be refilled? (please list name of each medication and dose if known) furosemide (LASIX) 40 MG tablet (Expired)   2. Which pharmacy/location (including street and city if local pharmacy) is medication to be sent to?  [place] [place]    3. Do they need a 30 day or 90 day supply? [place]

## 2022-02-27 ENCOUNTER — Ambulatory Visit (INDEPENDENT_AMBULATORY_CARE_PROVIDER_SITE_OTHER): Payer: Medicare Other | Admitting: Family Medicine

## 2022-02-27 ENCOUNTER — Telehealth: Payer: Self-pay

## 2022-02-27 VITALS — BP 116/70 | HR 70 | Temp 98.0°F | Ht 65.0 in | Wt 198.4 lb

## 2022-02-27 DIAGNOSIS — B379 Candidiasis, unspecified: Secondary | ICD-10-CM

## 2022-02-27 DIAGNOSIS — M419 Scoliosis, unspecified: Secondary | ICD-10-CM

## 2022-02-27 DIAGNOSIS — M545 Low back pain, unspecified: Secondary | ICD-10-CM

## 2022-02-27 LAB — POCT URINALYSIS DIP (MANUAL ENTRY)
Bilirubin, UA: NEGATIVE
Blood, UA: NEGATIVE
Glucose, UA: NEGATIVE mg/dL
Ketones, POC UA: NEGATIVE mg/dL
Leukocytes, UA: NEGATIVE
Nitrite, UA: NEGATIVE
Protein Ur, POC: NEGATIVE mg/dL
Spec Grav, UA: <=1.005 — AB (ref 1.010–1.025)
Urobilinogen, UA: 0.2 E.U./dL
pH, UA: 5.5 (ref 5.0–8.0)

## 2022-02-27 MED ORDER — BORIC ACID VAGINAL 600 MG VA SUPP: 1.0000 | Freq: Every day | VAGINAL | 0 refills | Status: DC

## 2022-02-27 NOTE — Patient Instructions (Addendum)
It was wonderful to see you today. Thank you for allowing me to be a part of your care. Below is a short summary of what we discussed at your visit today:  PAP smear If desired, please book on the way out the clinic today at the front desk.   Vaginal yeast infection with Candida glabrata I believe your yeast infection did not respond to the medicine from the urgent care because it is a different type of yeast infection.   I will prescribe you boric acid capsules to be placed as a vaginal suppository at bedtime for 2 weeks.  This must be placed in the vagina only. DO NOT TAKE BY MOUTH - THIS CAN BE FATAL.   Back pain Use topical lidocaine patches, available over-the-counter Do the stretches that physical therapy taught you Do exercises in a pool to offload weight on the back  Foot support You can place any foot insert with good arch support in your tennis shoes.  One of the inserts recommended by the sports medicine clinic is [employer].  They can be found online on the website or on [employer].  Please bring all of your medications to every appointment!  If you have any questions or concerns, please do not hesitate to contact us via phone or MyChart message.   Ezequiel Essex, MD

## 2022-02-27 NOTE — Progress Notes (Unsigned)
    SUBJECTIVE:   CHIEF COMPLAINT / HPI:   Vaginal and vulvar irritation Patient reports approximately 1 week of vulvar pruritus.  Was seen at urgent care 02/13/2022.  Vaginal swab demonstrated Candida glabrata, prescribed Diflucan.  Also urgent care, UA demonstrated trace blood.  Urine was cultured, subsequently demonstrated insignificant growth of less than 10,000 colonies.  Today patient reports she is taking the Diflucan as prescribed, no missed doses.  However she is still having the vulvar irritation.  No changes in symptoms.  She denies any abnormal discharge, dysuria, or pelvic pain.  She has been trying to wear white cotton underwear, which has improved the severity of symptoms.  Back pain Patient also wants to discuss her low back and left flank pain.  She feels as though she leans to the left. She reports this is acute but has worsened over the last week.  No known trauma or accidents.  Previously underwent MRI of thoracic and lumbar spine 01/30/2020.  Thoracic study showed mild disc degeneration and mild chronic compression fracture of T4.  Lumbar study demonstrated moderate lumbar scoliosis, multilevel disc and facet degeneration, and mild to moderate subarticular and foraminal stenosis.  PERTINENT  PMH / PSH: Back pain, atypical chest pain, daytime somnolence, aortic valve agitation, renal infarct, HLD, aortic atherosclerosis  OBJECTIVE:   BP 116/70   Pulse 70   Temp 98 F (36.7 C)   Ht '5\' 5"'$  (1.651 m)   Wt 198 lb 6.4 oz (90 kg)   SpO2 97%   BMI 33.02 kg/m     PHQ-9:     02/27/2022    4:58 PM 02/27/2022    2:48 PM 12/06/2021    1:39 PM  Depression screen PHQ 2/9  Decreased Interest 0 0 0  Down, Depressed, Hopeless 0 0 0  PHQ - 2 Score 0 0 0  Altered sleeping 0 0 0  Tired, decreased energy 0 0 0  Change in appetite 0 0 0  Feeling bad or failure about yourself  0 0 0  Trouble concentrating 0 0 0  Moving slowly or fidgety/restless 0 0 0  Suicidal thoughts 0 0 0   PHQ-9 Score 0 0 0  Difficult doing work/chores Not difficult at all  Not difficult at all   Physical Exam General: Awake, alert, oriented Respiratory: Normal work of breathing, speaks in full sentences, no dyspnea MSK: Right iliac crest higher than left, TTP over lumbar spine and SI joints, worse on left  ASSESSMENT/PLAN:   Candida glabrata infection Suspect vulvar irritation related to Candida glabrata infection.  Given lack of response to Diflucan from urgent care, will Rx boric acid suppositories.  Scoliosis of lumbar spine Suspect moderate scoliosis of lumbar spine responsible for patient's feeling of "leaning to the left" and back pain.  Discussed MRI findings from 2021 with patient and showed images.  Recommend conservative treatment with physical therapy (patient has already been, will do exercises on her own), aquatic therapy, stretches, strengthening, lidocaine patches, and Tylenol ibuprofen.     Ezequiel Essex, MD [place]

## 2022-02-27 NOTE — Telephone Encounter (Signed)
Patient LVM on nurse line requesting a call back.   I attempted to call patient, however no answer.   VM left to call the office tomorrow.

## 2022-02-28 ENCOUNTER — Encounter: Payer: Self-pay | Admitting: Family Medicine

## 2022-02-28 DIAGNOSIS — M419 Scoliosis, unspecified: Secondary | ICD-10-CM | POA: Insufficient documentation

## 2022-02-28 DIAGNOSIS — B379 Candidiasis, unspecified: Secondary | ICD-10-CM | POA: Insufficient documentation

## 2022-02-28 NOTE — Telephone Encounter (Signed)
Patient returns call to nurse line. She reports that Boric acid suppositories are not covered by insurance as they are OTC. She is requesting that an alternative medication be sent in that is not OTC.  Out of pocket cost is approx $20.   If appropriate, please send new rx to pharmacy.   Talbot Grumbling, RN

## 2022-02-28 NOTE — Assessment & Plan Note (Signed)
Suspect moderate scoliosis of lumbar spine responsible for patient's feeling of "leaning to the left" and back pain.  Discussed MRI findings from 2021 with patient and showed images.  Recommend conservative treatment with physical therapy (patient has already been, will do exercises on her own), aquatic therapy, stretches, strengthening, lidocaine patches, and Tylenol ibuprofen.

## 2022-02-28 NOTE — Assessment & Plan Note (Signed)
Suspect vulvar irritation related to Candida glabrata infection.  Given lack of response to Diflucan from urgent care, will Rx boric acid suppositories.

## 2022-03-02 ENCOUNTER — Telehealth: Payer: Self-pay | Admitting: Student

## 2022-03-02 MED ORDER — TOPI-CLICK VAGINAL DOSING MISC: 0 refills | Status: DC

## 2022-03-02 NOTE — Telephone Encounter (Signed)
Alternative would be nystatin suppository. I am having a difficult time finding the correct order. Should be 100,000 units Nystatin suppository in vagina QHS for 14 days to treat candida glabrata.   Ezequiel Essex, MD

## 2022-03-02 NOTE — Telephone Encounter (Signed)
Patient came in stating that when she was seen on 02/27/22, her doctor wrote a prescription for a suppository. However she was told that it wasn't covered with her insurance. She would like something different, if possible, that would be covered.

## 2022-03-02 NOTE — Addendum Note (Signed)
Addended by: Renard Hamper on: 03/02/2022 08:23 PM   Modules accepted: Orders

## 2022-03-06 ENCOUNTER — Telehealth: Payer: Self-pay | Admitting: Student

## 2022-03-06 NOTE — Telephone Encounter (Signed)
Pt walked in requesting a prescription for suppositories, states the pharmacy said what was original prescribed can be purchased over the count. She can not afford the cost for over the counter.  She is also requesting medication for pelvic pain.   Patient was made aware of her appt on 03/10/22. Declined to schedule earlier appt. for today due to conflict with another appt.

## 2022-03-07 ENCOUNTER — Ambulatory Visit: Payer: Medicare Other | Attending: Internal Medicine

## 2022-03-07 NOTE — Progress Notes (Deleted)
Office Visit    Patient Name: [name] Date of Encounter: 03/07/2022  Primary Care Provider:  Alen Bleacher, MD Primary Cardiologist:  Donato Heinz, MD  Chief Complaint    Hypertension  Significant Past Medical History   CAD 9/22 Calcium score 936 (97th percentile), total occlusion in mid-distal RCA w/left to right collaterals, mild prox LAD disease: Asa 81, atorvastatin 80, carvedilol 25, imdur 60, ntg sl  CHF Chronic diastolic;  furosemide 40 prn weight change, hctz 25 mg qd  HLD 10/23 LDL 105 atorvastatin 80          Allergies  Allergen Reactions   Shrimp [Shellfish Allergy] Hives and Itching   Latex Itching   Penicillins Itching and Rash    Has patient had a PCN reaction causing immediate rash, facial/tongue/throat swelling, SOB or lightheadedness with hypotension: unknown Has patient had a PCN reaction causing severe rash involving mucus membranes or skin necrosis: unknown Has patient had a PCN reaction that required hospitalization unknown Has patient had a PCN reaction occurring within the last 10 years: No If all of the above answers are "NO", then may proceed with Cephalosporin use.    History of Present Illness    [name] is a 71 y.o. female patient of Dr ***,  Blood Pressure Goal:  130/80  Current Medications:  carvedilol 25 mg bid, HCTZ 25 mg qd, losartan 100 mg qd, amlodipine 10 mg qd, isosorbide mono 15 mg qd, furosemide 40 mg prn  Previously tried:    Family Hx:     Social Hx:      Tobacco:  Alcohol:  Caffeine: Diet:      Exercise:   Home BP readings:         Accessory Clinical Findings    Lab Results  Component Value Date   CREATININE 1.11 (H) 12/28/2021   BUN 17 12/28/2021   NA 143 12/28/2021   K 4.4 12/28/2021   CL 105 12/28/2021   CO2 24 12/28/2021   Lab Results  Component Value Date   ALT 20 06/19/2020   AST 27 06/19/2020   ALKPHOS 81 06/19/2020   BILITOT 0.4 06/19/2020   Lab Results   Component Value Date   HGBA1C 5.9 01/31/2017    Home Medications    Current Outpatient Medications  Medication Sig Dispense Refill   amLODipine (NORVASC) 5 MG tablet TAKE 1 TABLET(5 MG) BY MOUTH DAILY 90 tablet 0   aspirin EC 81 MG tablet Take 1 tablet (81 mg total) daily by mouth. 30 tablet 11   atorvastatin (LIPITOR) 80 MG tablet TAKE 1 TABLET(80 MG) BY MOUTH DAILY AT 6 PM 90 tablet 3   carvedilol (COREG) 12.5 MG tablet Take 1 tablet (12.5 mg total) by mouth 2 (two) times daily with a meal. 60 tablet 2   diclofenac Sodium (VOLTAREN) 1 % GEL Apply 2 g topically 4 (four) times daily. 2 g 1   ezetimibe (ZETIA) 10 MG tablet Take by mouth.     fluconazole (DIFLUCAN) 150 MG tablet Take 1 tablet (150 mg total) by mouth daily. 1 tablet 0   fluticasone (FLONASE) 50 MCG/ACT nasal spray SHAKE LIQUID AND USE 2 SPRAYS IN EACH NOSTRIL TWICE DAILY 16 g 1   furosemide (LASIX) 40 MG tablet Take 1 tablet (40 mg total) by mouth daily. 30 tablet 9   gabapentin (NEURONTIN) 300 MG capsule Take 300 mg by mouth 2 (two) times daily.     ibuprofen (ADVIL) 600 MG tablet Take 1 tablet (600  mg total) by mouth 3 (three) times daily. 30 tablet 0   isosorbide mononitrate (IMDUR) 60 MG 24 hr tablet Take 1 tablet (60 mg total) by mouth daily. 30 tablet 0   lidocaine (HM LIDOCAINE PATCH) 4 % Place 1 patch onto the skin daily. 15 patch 0   losartan (COZAAR) 100 MG tablet TAKE 1 TABLET(100 MG) BY MOUTH DAILY 90 tablet 3   meloxicam (MOBIC) 15 MG tablet Take 1 tablet (15 mg total) by mouth daily. 30 tablet 0   methylPREDNISolone (MEDROL DOSEPAK) 4 MG TBPK tablet Take as directed 21 tablet 0   Misc. Devices (TOPI-CLICK VAGINAL DOSING) MISC Apply 100,000 units Nystatin suppository in vagina QHS for 14 days to treat candida glabrata. 14 each 0   Multiple Vitamin (MULITIVITAMIN WITH MINERALS) TABS Take 1 tablet by mouth daily.     nitroGLYCERIN (NITROSTAT) 0.4 MG SL tablet PLACE 1 TABLET UNDER THE TONGUE EVERY 5 MINUTES AS  NEEDED 25 tablet 3   ondansetron (ZOFRAN-ODT) 4 MG disintegrating tablet Take 1 tablet (4 mg total) by mouth every 8 (eight) hours as needed for nausea or vomiting. 10 tablet 0   potassium chloride SA (KLOR-CON M) 20 MEQ tablet Take 1 tablet (20 mEq total) by mouth 2 (two) times daily. 60 tablet 0   No current facility-administered medications for this visit.     Assessment & Plan    No BP recorded.  {Refresh Note OR Click here to enter BP  :1}***   No problem-specific Assessment & Plan notes found for this encounter.   Tommy Medal PharmD CPP [place]  [address] [place], [place] [address] [phone]

## 2022-03-08 ENCOUNTER — Telehealth: Payer: Self-pay | Admitting: Cardiology

## 2022-03-08 NOTE — Telephone Encounter (Signed)
LMTCB

## 2022-03-08 NOTE — Telephone Encounter (Signed)
Patient states that yesterday she went to the office for her pharmacy appointment and someone still called her afterwards. She would like to know why is it necessary to see a pharmacist every 3 months if the information is no being passed along. She assumes that nothing is being documented during these appointments. Please clarify.

## 2022-03-08 NOTE — Congregational Nurse Program (Signed)
  Dept: [phone]   Congregational Nurse Program Note  Date of Encounter: 03/08/2022  Past Medical History: Past Medical History:  Diagnosis Date   Abnormal EKG 2012   hospitalized for T wave inversion in lateral leads with [facility] chest pains, normal ECHO, negative trops, no cardiology consult. recent EKG 7/15 stable T wave inversions.    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 Yes    Student Assistance N/A    Location Patient [place]    Visit Setting with Client Phone/Text/Email    Patient Status Unknown    Insurance Medicare    Insurance/Financial Assistance Referral N/A    Medication N/A    Medical Provider Yes    Screening Referrals Made N/A    Medical Referrals Made N/A    Medical Appointment Made N/A    Recently w/o PCP, now 1st time PCP visit completed due to CNs referral or appointment made N/A    Food N/A    Transportation N/A    Housing/Utilities N/A    Interpersonal Safety N/A    Interventions Spiritual Care    Abnormal to Normal Screening Since Last CN Visit N/A    Screenings CN Performed Blood Pressure;Height;Temperature;Pulse Ox;Weight    Sent Client to Lab for: N/A    Did client attend any of the following based off CNs referral or appointments made? N/A    ED Visit Averted N/A    Life-Saving Intervention Made N/A             Today's Vitals   03/08/22 1400  BP: 134/67  Pulse: 63  Resp: 18  Temp: (!) 97.3 F (36.3 C)  TempSrc: Temporal  SpO2: 98%  Weight: 197 lb  (89.4 kg)  Height: '5\' 5"'$  (1.651 m)   Body mass index is 32.78 kg/m.  Patient used personal device to obtain blood glucose. Blood glucose results was 97. Patient seen in clinic. Patient states she is trying to drink more water and that is a health goal she has. Spiritual care given.

## 2022-03-08 NOTE — Congregational Nurse Program (Signed)
  Dept: [phone]   Congregational Nurse Program Note  Date of Encounter: 11/02/2021  Past Medical History: Past Medical History:  Diagnosis Date   Abnormal EKG 2012   hospitalized for T wave inversion in lateral leads with [facility] chest pains, normal ECHO, negative trops, no cardiology consult. recent EKG 7/15 stable T wave inversions.    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 Patient states this is patient average pulse. Patient given education about hypertension. Educational class given on proper techniques of canning and preserving food. Spiritual care given.

## 2022-03-08 NOTE — Congregational Nurse Program (Signed)
  Dept: [phone]   Congregational Nurse Program Note  Date of Encounter: 11/16/2021  Past Medical History: Past Medical History:  Diagnosis Date   Abnormal EKG 2012   hospitalized for T wave inversion in lateral leads with [facility] chest pains, normal ECHO, negative trops, no cardiology consult. recent EKG 7/15 stable T wave inversions.    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 CCNP Questionnaire completed and in patient medical record. Education given concerning hypertension. Patient stated she will keep a log of blood pressure at home for a week and report it to her provider. Patient also express concern about edema to bilateral ankles. Education given about the meaning of edema. Patient state she will follow up with congregational nurse.

## 2022-03-09 NOTE — Telephone Encounter (Signed)
LMTCB

## 2022-03-09 NOTE — Telephone Encounter (Signed)
Patient is returning call.  °

## 2022-03-09 NOTE — Telephone Encounter (Signed)
Pt calling back r

## 2022-03-09 NOTE — Telephone Encounter (Addendum)
Spoke with patient of Dr. Gardiner Rhyme. Patient said her PCP told her pharmacy appointment was an incorrect day (12/11) and she came to our office and was unable to be seen. Her visit was on 12/12. She is upset that she drove all the way here and no one saw her. Explained that our chart never showed a 12/11 appt and her PCP in in [facility] and should have been able to provide her the correct appointment info.   She would like to be r/s with pharmacy team  Routed to them and schedulers

## 2022-03-10 ENCOUNTER — Ambulatory Visit (INDEPENDENT_AMBULATORY_CARE_PROVIDER_SITE_OTHER): Payer: Medicare Other | Admitting: Student

## 2022-03-10 ENCOUNTER — Encounter: Payer: Self-pay | Admitting: Student

## 2022-03-10 VITALS — BP 128/72 | HR 82 | Wt 199.8 lb

## 2022-03-10 DIAGNOSIS — N898 Other specified noninflammatory disorders of vagina: Secondary | ICD-10-CM | POA: Diagnosis not present

## 2022-03-10 DIAGNOSIS — Z113 Encounter for screening for infections with a predominantly sexual mode of transmission: Secondary | ICD-10-CM

## 2022-03-10 DIAGNOSIS — Z124 Encounter for screening for malignant neoplasm of cervix: Secondary | ICD-10-CM

## 2022-03-10 LAB — POCT WET PREP (WET MOUNT)
Clue Cells Wet Prep Whiff POC: NEGATIVE
Trichomonas Wet Prep HPF POC: ABSENT
WBC, Wet Prep HPF POC: NONE SEEN

## 2022-03-10 NOTE — Progress Notes (Signed)
    SUBJECTIVE:   CHIEF COMPLAINT / HPI:   Patient is a 71 year old female presenting today for Pap smear. She says she has been having vaginal itchiness the past 2-3 months. Went to the urgent care where she was treated with Diflucan. She said Diflucan improved her symptoms but she continue to still have the itchiness. Last Pap smear in 2017 was negative. Not sexual active. She denies, vaginal discharge, bleeding, urinary urgency or frequency.  PERTINENT  PMH / PSH: Reviewed   OBJECTIVE:   BP 128/72   Pulse 82   Wt 199 lb 12.8 oz (90.6 kg)   SpO2 96%   BMI 33.25 kg/m    Physical Exam General: Alert, well appearing, NAD Cardiovascular: RRR, No Murmurs, Normal S2/S2 Respiratory: CTAB, No wheezing or Rales Abdomen: No distension or tenderness Genitalia:  Normal introitus for age, no external lesions, white copious vaginal discharge with no odor, mucosa pink and moist, no vaginal or cervical lesions, no vaginal atrophy, no friaility or hemorrhage, normal uterus size and position   CMA Ivin Booty served as the chaperone for the vaginal exam.  ASSESSMENT/PLAN:   Vaginal Itchiness Recently positive for Candida glabrata and treated with Diflucan. Still endorses itchiness although improved with treatment.  Advised patient to use over-the-counter boric acid. Will consider Nystatin suppository if symptoms persist .  On exam  have white copious vaginal discharge with normal mucosa of the vaginal vulvar wall -Recommend use of over-the-counter boric acid -Follow up as needed if no improvement of symptom. -Follow up with wet prep result  HCM Declined shingle and COVID vaccine   Alen Bleacher, MD [facility]

## 2022-03-10 NOTE — Patient Instructions (Signed)
It was wonderful to see you today. Thank you for allowing me to be a part of your care. Below is a short summary of what we discussed at your visit today:  Vaginal exam today shows white discharges so we collected sample for wet prep to test for fungi and bacteria infection   I recommend use of over-the-counter boric acid.   Please bring all of your medications to every appointment!  If you have any questions or concerns, please do not hesitate to contact us via phone or MyChart message.   Alen Bleacher, MD [facility]

## 2022-03-19 ENCOUNTER — Encounter (HOSPITAL_COMMUNITY): Payer: Self-pay | Admitting: Emergency Medicine

## 2022-03-19 ENCOUNTER — Ambulatory Visit (HOSPITAL_COMMUNITY)
Admission: EM | Admit: 2022-03-19 | Discharge: 2022-03-19 | Disposition: A | Discharge status: Home / Self Care | Payer: Medicare Other | Attending: Physician Assistant | Admitting: Physician Assistant

## 2022-03-19 DIAGNOSIS — M13862 Other specified arthritis, left knee: Secondary | ICD-10-CM | POA: Diagnosis not present

## 2022-03-19 DIAGNOSIS — R109 Unspecified abdominal pain: Secondary | ICD-10-CM

## 2022-03-19 DIAGNOSIS — M25562 Pain in left knee: Secondary | ICD-10-CM | POA: Diagnosis not present

## 2022-03-19 MED ORDER — IBUPROFEN 600 MG PO TABS: 600.0000 mg | ORAL_TABLET | Freq: Three times a day (TID) | ORAL | 0 refills | Status: DC

## 2022-03-19 NOTE — ED Triage Notes (Signed)
Pt reports left knee pain and swelling x 1 week and left side pain x 1 week.  States pain was worse today. Has been taking Tylenol with no relief.

## 2022-03-19 NOTE — Discharge Instructions (Signed)
Advised to take ibuprofen 600 mg every 8 hours with food to help reduce the swelling and pain of the left knee and left side. Advised to wear the knee support when up and walking around to give added support to the knee. Advised to follow-up with PCP or consider orthopedic consult if the knee pain and swelling continues.

## 2022-03-19 NOTE — ED Provider Notes (Signed)
[facility]    CSN: [id number] Arrival date & time: 03/19/22  1320      History   Chief Complaint Chief Complaint  Patient presents with   Knee Pain   Flank Pain    HPI [name] is a 71 y.o. female.   71 year old female presents with left knee pain and left flank pain.  Patient indicates for the past week she has been having left knee pain with swelling associated.  Patient indicates the pain is worse when she has to advised to stand and walk and bear weight on the left leg.  Patient indicates she has been using ice without any improvement from the swelling.  Patient denies any trauma to the left knee.  Patient also indicates for the past week she has been having some intermittent left flank pain and discomfort.  Patient denies any fever, nausea, vomiting.  Patient denies having any urinary symptoms such as frequency, urgency, or dysuria.  Patient indicates that the left flank pain tends to come and go over the past week without any history of trauma.  Patient indicates she is tolerating fluids well.   Knee Pain Flank Pain    Past Medical History:  Diagnosis Date   Abnormal EKG 2012   hospitalized for T wave inversion in lateral leads with MSK chest pains, normal ECHO, negative trops, no cardiology consult. recent EKG 7/15 stable T wave inversions.    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   blood clot in the kidney    Patient Active Problem List   Diagnosis Date Noted   Scoliosis of lumbar spine 02/28/2022   Candida glabrata infection 02/28/2022   Daytime somnolence 01/12/2021   Atypical chest pain 12/13/2020   Abnormal cardiac CT angiography 12/13/2020   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   Vaginal itching 12/05/2018   Leg edema 08/28/2018   Thickened endometrium    Umbilical hernia without obstruction and without gangrene 01/01/2018   Osteopenia 09/05/2017   Closed nondisplaced spiral fracture of shaft of right tibia 03/28/2017   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   HLD (hyperlipidemia)    Aortic atherosclerosis ([place])    Back pain 07/08/2015   Scotoma of blind spot area in visual field 06/14/2015   Seasonal allergies 03/09/2015   Hypopigmentation 03/09/2015   Osteoarthritis of left knee 07/08/2014   DJD (degenerative joint disease), lumbar 07/08/2014   Osteoarthritis of right knee 07/08/2014   Urinary frequency 06/11/2014   Allergic rhinoconjunctivitis of both eyes 06/11/2014   Nearsightedness 04/27/2014   Abnormal EKG    Hypertension 12/11/2013    Past Surgical History:  Procedure Laterality Date   ABDOMINAL SURGERY  2010   for bowel blockage   COLON SURGERY  2010   bowel blockage   COLONOSCOPY  09/2014   HYSTEROSCOPY WITH D & C N/A 05/08/2018   Procedure: DILATATION AND CURETTAGE /HYSTEROSCOPY;  Surgeon: Mora Bellman, MD;  Location: [facility];  Service: Gynecology;  Laterality: N/A;   KNEE ARTHROSCOPY Left 09/25/2013   Procedure: LEFT KNEE ARTHROSCOPY WITH PARTIAL MEDIAL AND LATERAL  MENISCECTOMY/DEBRIDEMENT/MICRO FRACTURE MEDIAL FEMORAL CONDYLE,  REMOVAL OF OSTEOCONDRAL FRAGMENT;  Surgeon: Johnn Hai, MD;  Location: [facility];  Service: Orthopedics;  Laterality: Left;   KNEE SURGERY Right 1999   LEFT HEART CATH AND CORONARY ANGIOGRAPHY N/A 12/13/2020   Procedure: LEFT HEART CATH AND CORONARY ANGIOGRAPHY;  Surgeon: Nelva Bush, MD;  Location: [facility];  Service: Cardiovascular;  Laterality: N/A;   POLYPECTOMY     TEE WITHOUT CARDIOVERSION N/A 10/04/2015   Procedure: TRANSESOPHAGEAL ECHOCARDIOGRAM (TEE);  Surgeon: Sueanne Margarita, MD;  Location: [facility];  Service:  Cardiovascular;  Laterality: N/A;   TUBAL LIGATION  1978    OB History     Gravida  2   Para  2   Term      Preterm      AB      Living  2      SAB      IAB      Ectopic      Multiple      Live Births               Home Medications    Prior to Admission medications   Medication Sig Start Date End Date Taking? Authorizing Provider  ibuprofen (ADVIL) 600 MG tablet Take 1 tablet (600 mg total) by mouth 3 (three) times daily. 03/19/22  Yes Nyoka Lint, PA-C  amLODipine (NORVASC) 5 MG tablet TAKE 1 TABLET(5 MG) BY MOUTH DAILY 12/07/21   Alen Bleacher, MD  aspirin EC 81 MG tablet Take 1 tablet (81 mg total) daily by mouth. 01/31/17   Clent Demark, PA-C  atorvastatin (LIPITOR) 80 MG tablet TAKE 1 TABLET(80 MG) BY MOUTH DAILY AT 6 PM 11/15/21   Donato Heinz, MD  carvedilol (COREG) 12.5 MG tablet Take 1 tablet (12.5 mg total) by mouth 2 (two) times daily with a meal. 02/20/22 05/21/22  Alen Bleacher, MD  diclofenac Sodium (VOLTAREN) 1 % GEL Apply 2 g topically 4 (four) times daily. 12/06/21   Alen Bleacher, MD  ezetimibe (ZETIA) 10 MG tablet Take by mouth. 10/26/21   [provider]  fluconazole (DIFLUCAN) 150 MG tablet Take 1 tablet (150 mg total) by mouth daily. 02/17/22   Melynda Ripple, NP  fluticasone (FLONASE) 50 MCG/ACT nasal spray SHAKE LIQUID AND USE 2 SPRAYS IN EACH NOSTRIL TWICE DAILY 02/13/22   Alen Bleacher, MD  furosemide (LASIX) 40 MG tablet Take 1 tablet (40 mg total) by mouth daily. 02/22/22 02/22/23  Donato Heinz, MD  gabapentin (NEURONTIN) 300 MG capsule Take 300 mg by mouth 2 (two) times daily. 11/14/19   [provider]  ibuprofen (ADVIL) 600 MG tablet Take 1 tablet (600 mg total) by mouth 3 (three) times daily. 01/21/22   Nyoka Lint, PA-C  isosorbide mononitrate (IMDUR) 60 MG 24 hr tablet Take 1 tablet (60 mg total) by mouth daily. 4/53/64   Delora Fuel, MD  lidocaine (HM LIDOCAINE PATCH) 4 % Place 1 patch onto  the skin daily. 12/06/21   Alen Bleacher, MD  losartan (COZAAR) 100 MG tablet TAKE 1 TABLET(100 MG) BY MOUTH DAILY 02/15/22   Donato Heinz, MD  meloxicam (MOBIC) 15 MG tablet Take 1 tablet (15 mg total) by mouth daily. 12/07/21   Enrique Sack, FNP  methylPREDNISolone (MEDROL DOSEPAK) 4 MG TBPK tablet Take as directed 12/07/21   Enrique Sack, [place]. Devices (TOPI-CLICK VAGINAL DOSING) MISC Apply 100,000 units Nystatin suppository in vagina QHS for 14 days to treat candida glabrata. 03/02/22  Ezequiel Essex, MD  Multiple Vitamin (MULITIVITAMIN WITH MINERALS) TABS Take 1 tablet by mouth daily.    [provider]  nitroGLYCERIN (NITROSTAT) 0.4 MG SL tablet PLACE 1 TABLET UNDER THE TONGUE EVERY 5 MINUTES AS NEEDED 01/09/22   Donato Heinz, MD  ondansetron (ZOFRAN-ODT) 4 MG disintegrating tablet Take 1 tablet (4 mg total) by mouth every 8 (eight) hours as needed for nausea or vomiting. 10/20/21   Barrett Henle, MD  potassium chloride SA (KLOR-CON M) 20 MEQ tablet Take 1 tablet (20 mEq total) by mouth 2 (two) times daily. 11/12/54   Delora Fuel, MD    Family History Family History  Problem Relation Age of Onset   Hypertension Mother    Heart disease Mother        has a pacemaker    Alzheimer's disease Mother    Hyperlipidemia Mother    Osteoporosis Mother    Heart failure Father    Hypertension Father    Alzheimer's disease Father    Hypertension Sister    Breast cancer Sister    Hypertension Sister    Gout Sister    Heart disease Son    Alcohol abuse Son    Pancreatitis Son    Hypertension Son    Cancer Neg Hx    Colon cancer Neg Hx    Esophageal cancer Neg Hx    Stomach cancer Neg Hx    Rectal cancer Neg Hx     Social History Social History   Tobacco Use   Smoking status: Former    Types: Cigarettes    Quit date: 07/29/2010    Years since quitting: 11.6    Passive exposure: Past   Smokeless tobacco: Never  Vaping Use   Vaping  Use: Never used  Substance Use Topics   Alcohol use: Not Currently   Drug use: Never     Allergies   Shrimp [shellfish allergy], Latex, and Penicillins   Review of Systems Review of Systems  Genitourinary:  Positive for flank pain.  Musculoskeletal:  Positive for joint swelling (left knee swelling).     Physical Exam Triage Vital Signs ED Triage Vitals  Enc Vitals Group     BP 03/19/22 1421 (!) 152/73     Pulse Rate 03/19/22 1421 64     Resp 03/19/22 1421 17     Temp 03/19/22 1421 98 F (36.7 C)     Temp Source 03/19/22 1421 Oral     SpO2 03/19/22 1421 98 %     Weight --      Height --      Head Circumference --      Peak Flow --      Pain Score 03/19/22 1419 10     Pain Loc --      Pain Edu? --      Excl. in [place]? --    No data found.  Updated Vital Signs BP (!) 152/73 (BP Location: Right Arm)   Pulse 64   Temp 98 F (36.7 C) (Oral)   Resp 17   SpO2 98%   Visual Acuity Right Eye Distance:   Left Eye Distance:   Bilateral Distance:    Right Eye Near:   Left Eye Near:    Bilateral Near:     Physical Exam Abdominal:     General: Abdomen is flat. Bowel sounds are normal.     Palpations: Abdomen is soft.     Tenderness: There is no abdominal tenderness. There is  no guarding or rebound.  Musculoskeletal:       Legs:     Comments: Left knee: 1+ swelling is present across the knee with pain on palpation of the medial and lateral knee joint.  Full range of motion with extension and flexion are normal, stability is normal, negative drawers negative Lachman's.  Negative Homans' sign.      UC Treatments / Results  Labs (all labs ordered are listed, but only abnormal results are displayed) Labs Reviewed - No data to display  EKG   Radiology No results found.  Procedures Procedures (including critical care time)  Medications Ordered in UC Medications - No data to display  Initial Impression / Assessment and Plan / UC Course  I have reviewed the  triage vital signs and the nursing notes.  Pertinent labs & imaging results that were available during my care of the patient were reviewed by me and considered in my medical decision making (see chart for details).    Plan: 1.  The left knee pain will be treated with the following: A.  Ibuprofen 600 mg every 8 hours with food to help reduce the knee swelling. B.  Knee sleeve support to help give added support to the knee when up and walking around. C.  Advised ice therapy, 10 minutes on 20 minutes off, 3-4 times throughout the day to help reduce swelling and discomfort. 2.  The left flank pain will be treated with the following: A.  Ibuprofen 600 mg every 8 hours to help relieve pain and discomfort. 3.  Patient advised to follow-up with PCP or return to urgent care if symptoms fail to improve. Final Clinical Impressions(s) / UC Diagnoses   Final diagnoses:  Acute pain of left knee  Allergic arthritis of left knee  Left sided abdominal pain     Discharge Instructions      Advised to take ibuprofen 600 mg every 8 hours with food to help reduce the swelling and pain of the left knee and left side. Advised to wear the knee support when up and walking around to give added support to the knee. Advised to follow-up with PCP or consider orthopedic consult if the knee pain and swelling continues.    ED Prescriptions     Medication Sig Dispense Auth. Provider   ibuprofen (ADVIL) 600 MG tablet Take 1 tablet (600 mg total) by mouth 3 (three) times daily. 30 tablet Nyoka Lint, PA-C      PDMP not reviewed this encounter.   Nyoka Lint, PA-C 03/19/22 1434

## 2022-03-25 ENCOUNTER — Other Ambulatory Visit: Payer: Self-pay | Admitting: Student

## 2022-03-25 DIAGNOSIS — M1712 Unilateral primary osteoarthritis, left knee: Secondary | ICD-10-CM

## 2022-03-25 IMAGING — MG MM DIGITAL SCREENING BILAT W/ TOMO AND CAD
8 series · 8 of 24 positions shown · non-contrast
Comparison: Previous exam(s).

CLINICAL DATA: Screening.

EXAM:
DIGITAL SCREENING BILATERAL MAMMOGRAM WITH TOMOSYNTHESIS AND CAD
TECHNIQUE: Bilateral screening digital craniocaudal and mediolateral oblique
mammograms were obtained. Bilateral screening digital breast
tomosynthesis was performed. The images were evaluated with
computer-aided detection.

[R CC synth-2D]
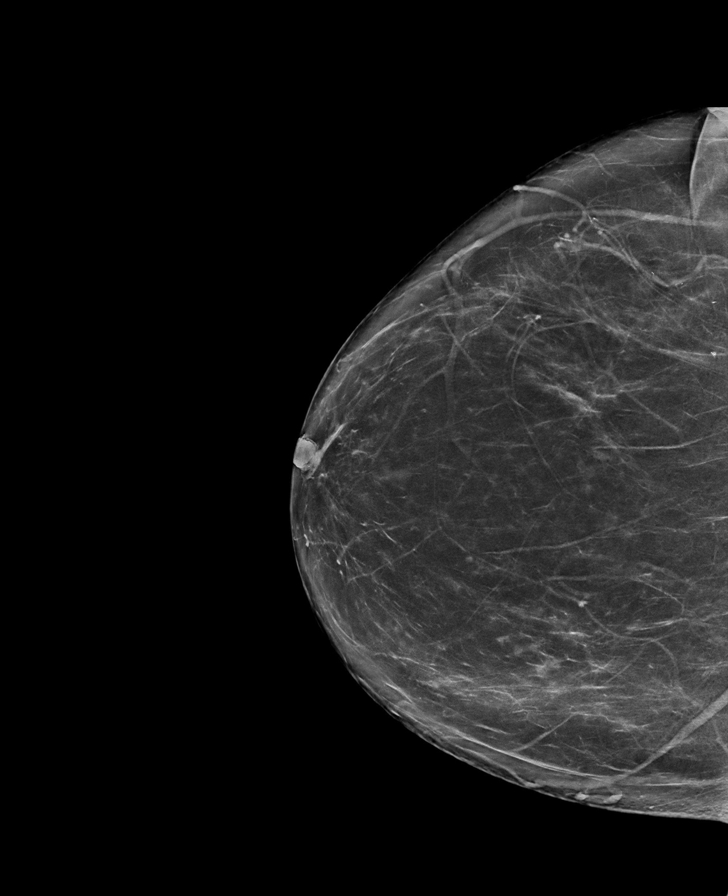

[L CC synth-2D]
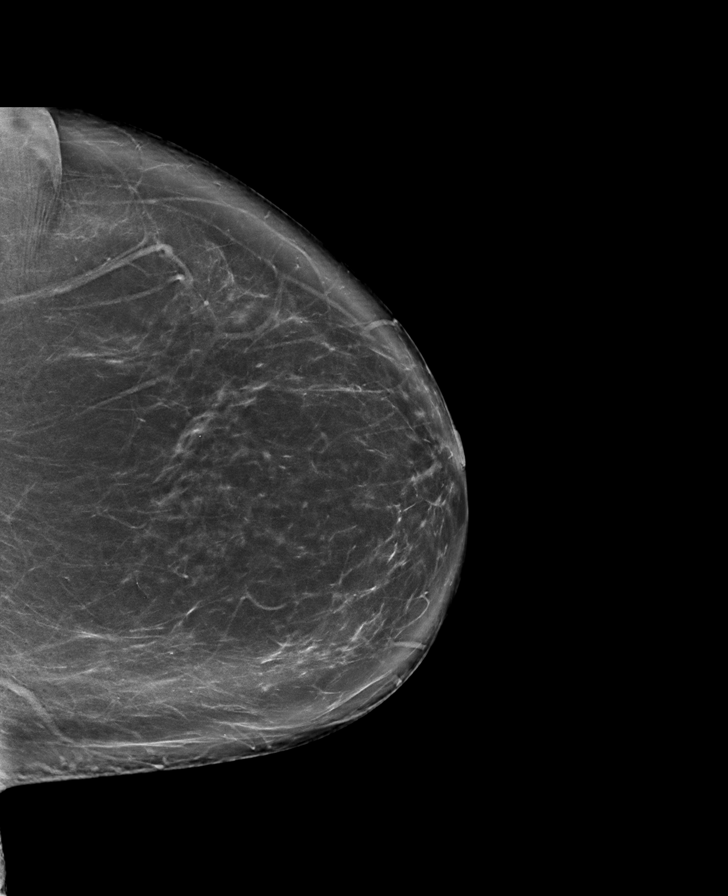

[L MLO synth-2D]
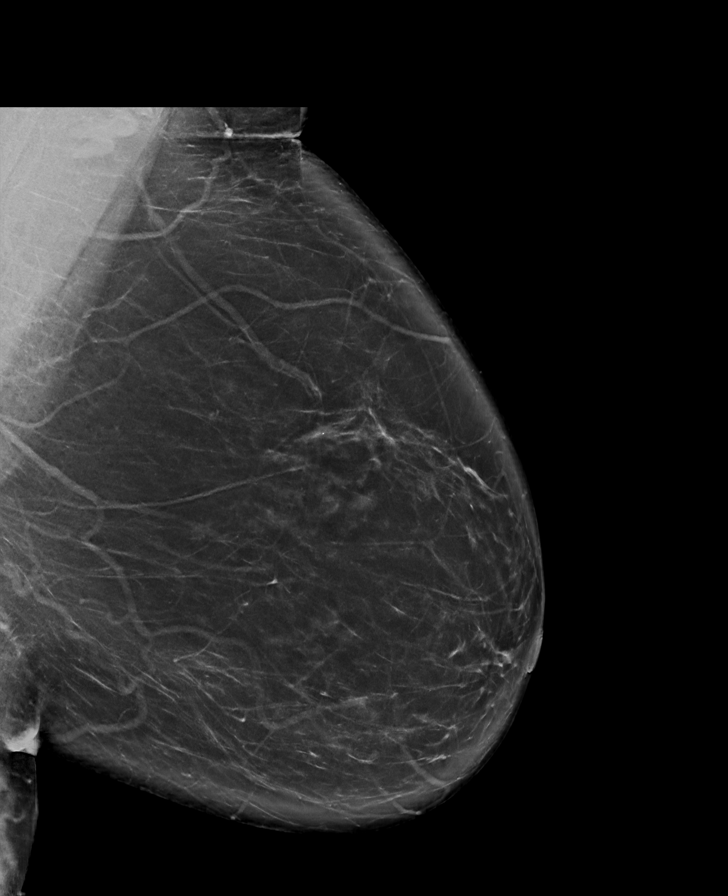

[R MLO synth-2D]
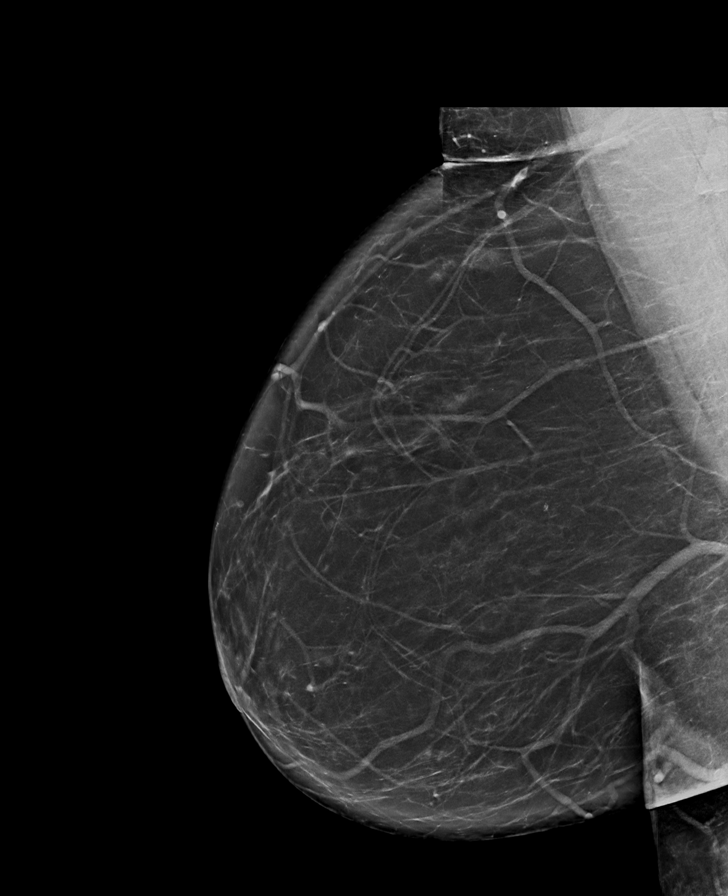

[L MLO tomo · tomo slice 49/97.0]
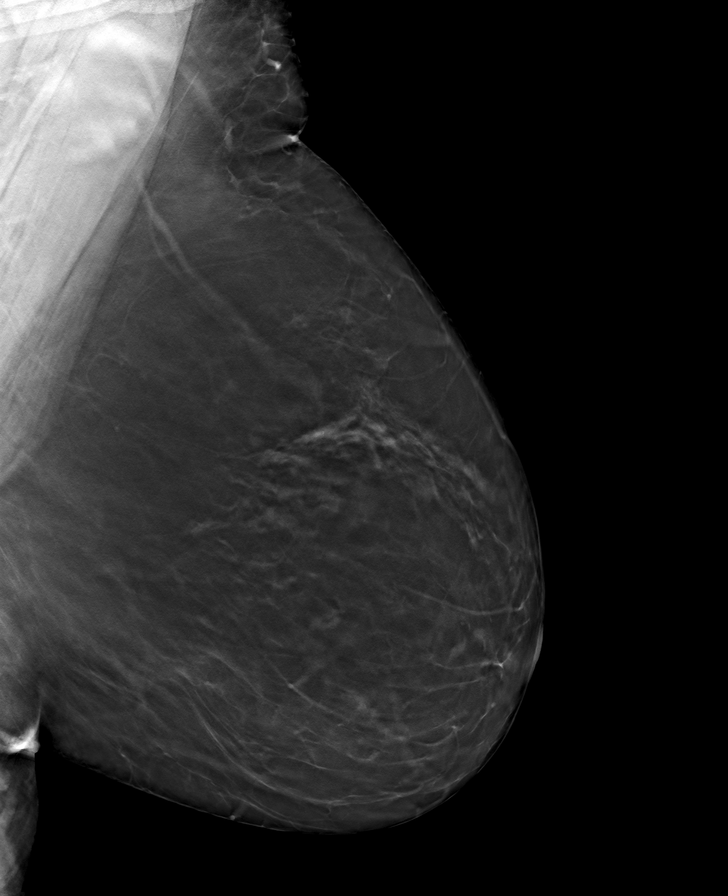

[R MLO tomo · tomo slice 45/90.0]
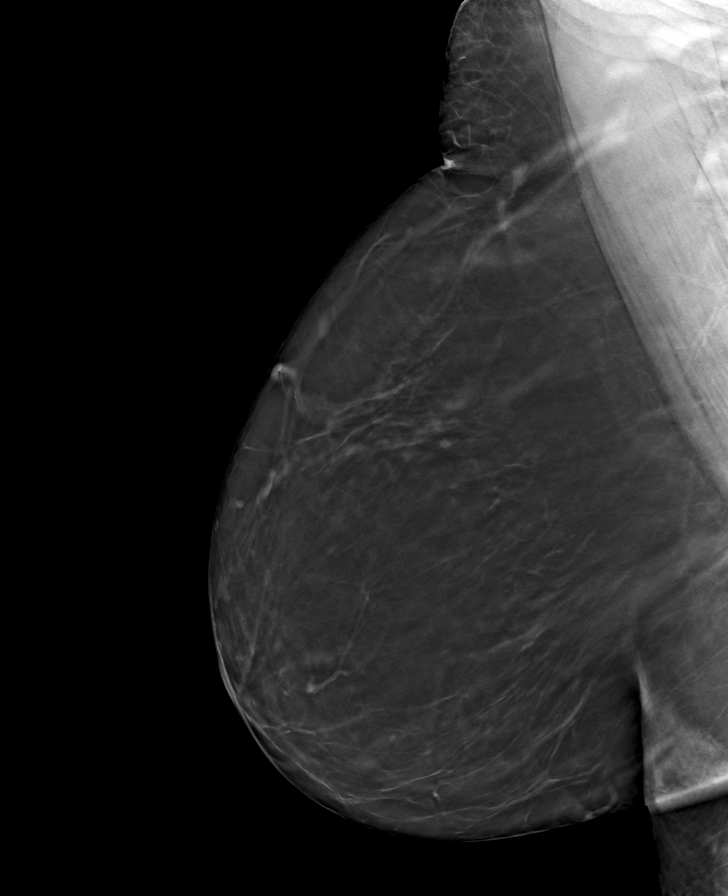

[L CC tomo · tomo slice 49/96.0]
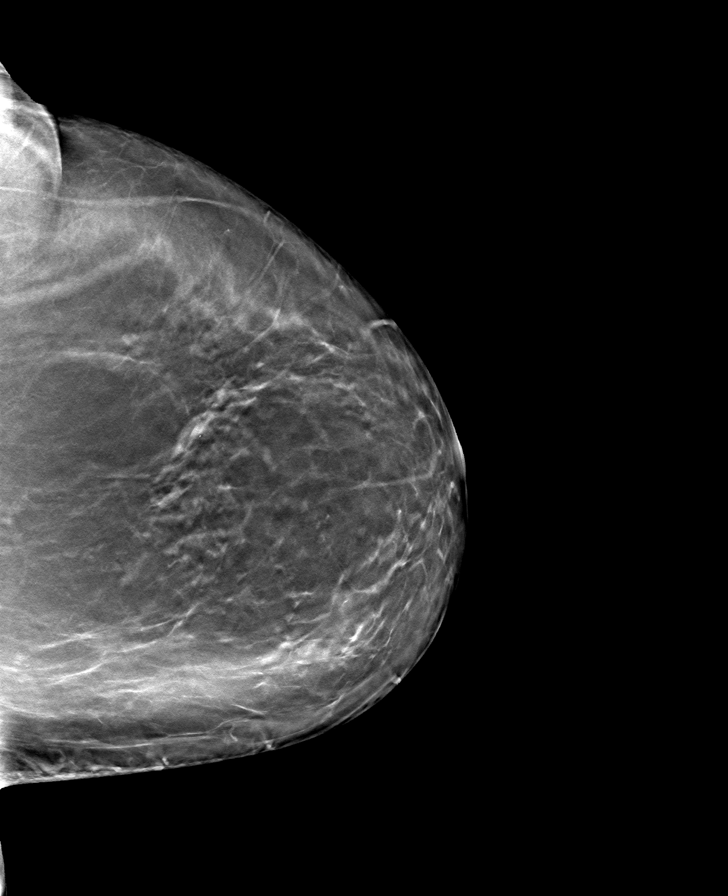

[R CC tomo · tomo slice 42/83.0]
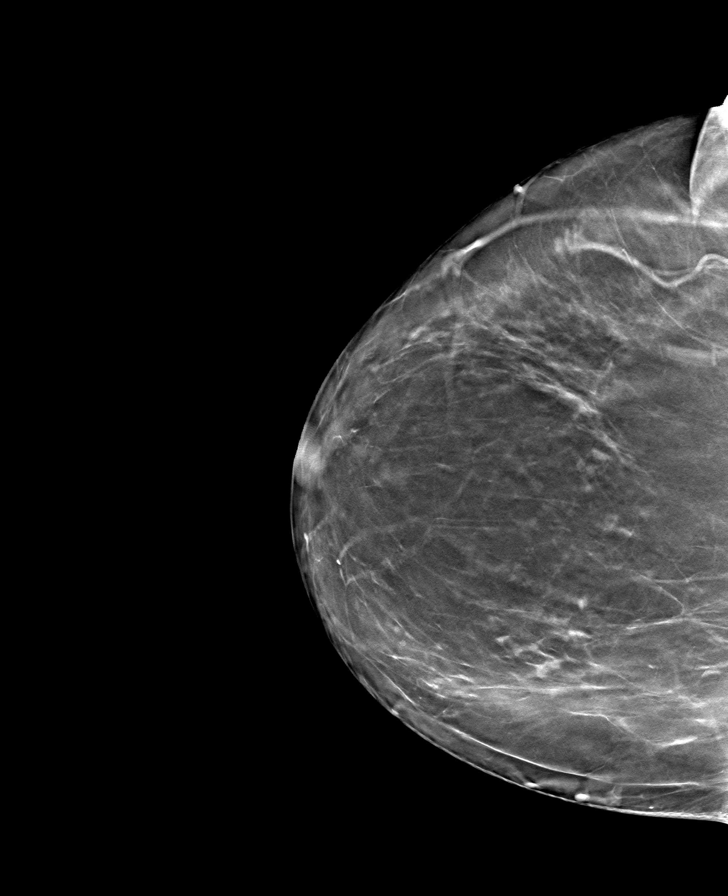

[8 of 24 positions shown; findings below may reference images not displayed]

ACR Breast Density Category b: There are scattered areas of
fibroglandular density.
FINDINGS: There are no findings suspicious for malignancy.
IMPRESSION: No mammographic evidence of malignancy. A result letter of this
screening mammogram will be mailed directly to the patient.

RECOMMENDATION:
Screening mammogram in one year. (Code:51-O-LD2)

BI-RADS CATEGORY  1: Negative.

## 2022-03-29 NOTE — Progress Notes (Signed)
Cardiology Clinic Note   Patient Name: [name] Date of Encounter: 03/30/2022  Primary Care Provider:  Jerre Simon, MD Primary Cardiologist:  Denise Ishikawa, MD  Patient Profile    [name] is a 72 y.o. female with a past medical history of CAD per [facility] 2022, chronic diastolic heart failure. aortic valve vegetation, aortic atherosclerosis, hypertension, hyperlipidemia, renal infarct who presents to the clinic today for 3 month follow-up of chronic cardiac conditions.   Past Medical History    Past Medical History:  Diagnosis Date   Abnormal EKG 2012   hospitalized for T wave inversion in lateral leads with MSK chest pains, normal ECHO, negative trops, no cardiology consult. recent EKG 7/15 stable T wave inversions.    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   Arthritis 2000   Arthritis 2011   Bilateral leg pain 06/14/2015   Clotting disorder (HCC)    blood clot kidney 2017   Heart murmur    Hyperlipidemia 2011   Hypertension 2011   Left elbow pain 03/31/2020   Meniscus tear 2000   L KNEE   Pelvic pain 10/07/2018   Renal infarct (HCC) 2017   blood clot in the kidney   Past Surgical History:  Procedure Laterality Date   ABDOMINAL SURGERY  2010   for bowel blockage   COLON SURGERY  2010   bowel blockage   COLONOSCOPY  09/2014   HYSTEROSCOPY WITH D & C N/A 05/08/2018   Procedure: DILATATION AND CURETTAGE /HYSTEROSCOPY;  Surgeon: Denise Antigua, MD;  Location: [facility];  Service: Gynecology;  Laterality: N/A;   KNEE ARTHROSCOPY Left 09/25/2013   Procedure: LEFT KNEE ARTHROSCOPY WITH PARTIAL MEDIAL AND LATERAL  MENISCECTOMY/DEBRIDEMENT/MICRO FRACTURE MEDIAL FEMORAL CONDYLE, REMOVAL OF OSTEOCONDRAL FRAGMENT;  Surgeon: Denise Docker, MD;  Location: [facility];  Service: Orthopedics;  Laterality: Left;   KNEE SURGERY Right 1999   LEFT HEART CATH AND CORONARY ANGIOGRAPHY N/A 12/13/2020   Procedure: LEFT HEART CATH AND CORONARY ANGIOGRAPHY;  Surgeon:  Denise Kendall, MD;  Location: [facility];  Service: Cardiovascular;  Laterality: N/A;   POLYPECTOMY     TEE WITHOUT CARDIOVERSION N/A 10/04/2015   Procedure: TRANSESOPHAGEAL ECHOCARDIOGRAM (TEE);  Surgeon: Denise Reichert, MD;  Location: [facility];  Service: Cardiovascular;  Laterality: N/A;   TUBAL LIGATION  1978    Allergies  Allergies  Allergen Reactions   Shrimp [Shellfish Allergy] Hives and Itching   Latex Itching   Penicillins Itching and Rash    Has patient had a PCN reaction causing immediate rash, facial/tongue/throat swelling, SOB or lightheadedness with hypotension: unknown Has patient had a PCN reaction causing severe rash involving mucus membranes or skin necrosis: unknown Has patient had a PCN reaction that required hospitalization unknown Has patient had a PCN reaction occurring within the last 10 years: No If all of the above answers are "NO", then may proceed with Cephalosporin use.    History of Present Illness    [name] has a past medical history of: CAD. Coronary CT 11/26/2020: Calcium score 936 (97th percentile).  Moderate CAD mid and distal RCA. FFR showed possible total occlusion mid to distal RCA and indeterminate FFR value distal LAD.  Aortic atherosclerosis. LHC 12/13/2020: Severe single-vessel CAD with chronic subtotal occlusion distal RCA with left-to-right collaterals.  Mild disease proximal LAD.  Aggressive secondary prevention recommended. Chronic diastolic heart failure. LVEDP 25-30 per cath September 2022. Aortic atherosclerosis. Coronary CT 11/26/2020: Aortic atherosclerosis.  Hypertension.  Hyperlipidemia.  Lipid panel 12/28/2021:  LDL 105, HDL 50, TG 196, total 189. Renal infarct.  Hospital admission July 6 through 10, 2017 for abdominal pain.  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   MRI abdomen W/WO contrast 09/30/2015: 2 wedge-shaped areas of hypoperfusion in the upper pole of the left kidney.  Negative UA, pyelonephritis unlikely.  Findings  most likely reflect renal infarcts. Echo 10/01/2015: EF 65-70%. Grade I DD.  Blood cultures: Negative.   Completed 6 months of anticoagulation on warfarin and was then positioned to aspirin. Aortic valve vegetation. TEE 10/04/2015: EF 60 to 65%.  Mild LVH.  Trivial AR. 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    [name] was first evaluated by Dr. Bjorn Pippin on 11/18/2020 for chest pain at the request of Dr. Leveda Anna. At that time she complained of a several month history of sharp left lower chest pain lasting approximately 5 minutes and occurring at times of stress.  Coronary CT showed a calcium score of 936 and moderate CAD of mid and distal RCA.  Left heart catheterization showed severe chronic subtotal occlusion of the distal RCA with left-to-right collaterals and mild disease in the proximal LAD.  Aggressive secondary prevention was recommended.  Patient was last seen in the office by Dr. Bjorn Pippin on 12/28/2021. She denied chest pain, dyspnea, lightheadedness, syncope, or palpitations.  She did report some lower extremity edema. At that time there was a question of medication compliance.  There was a question if she was taking both HCTZ and Lasix when she had previously been transitioned to as needed Lasix.  Her LDL was increased after Lipitor was changed to 80 mg daily so nonadherence was suspected.  She had an upcoming appointment with Pharm.D. and was asked to bring all medications to that appointment.  Today, patient comes in alone. She denies shortness of breath or dyspnea on exertion. No chest pain, pressure, or tightness. Denies orthopnea or PND. No palpitations. Patient reports mild puffiness of bilateral ankles that seems slightly increased from previous. It is unclear if it improves with elevation. She reports she is taking all of her medications as prescribed. She reports brisk diuresis with daily Lasix. She states  she is watching her dietary sodium intake and has not had canned foods in months opting for more fresh fruit and veggies. She does standing stretches for her legs and arms. She plans on starting a two day a week walking program with a friend.    Home Medications    Current Meds  Medication Sig   amLODipine (NORVASC) 5 MG tablet TAKE 1 TABLET(5 MG) BY MOUTH DAILY   aspirin EC 81 MG tablet Take 1 tablet (81 mg total) daily by mouth.   atorvastatin (LIPITOR) 80 MG tablet TAKE 1 TABLET(80 MG) BY MOUTH DAILY AT 6 PM   carvedilol (COREG) 12.5 MG tablet Take 1 tablet (12.5 mg total) by mouth 2 (two) times daily with a meal.   diclofenac Sodium (VOLTAREN) 1 % GEL APPLY 2 GRAMS TOPICALLY TO THE AFFECTED AREA FOUR TIMES DAILY   ezetimibe (ZETIA) 10 MG tablet Take by mouth.   fluconazole (DIFLUCAN) 150 MG tablet Take 1 tablet (150 mg total) by mouth daily.   fluticasone (FLONASE) 50 MCG/ACT nasal spray SHAKE LIQUID AND USE 2 SPRAYS IN EACH NOSTRIL TWICE DAILY   furosemide (LASIX) 40 MG tablet Take 1 tablet (40 mg total) by mouth daily.   gabapentin (NEURONTIN) 300 MG capsule Take 300 mg by mouth 2 (two) times  daily.   ibuprofen (ADVIL) 600 MG tablet Take 1 tablet (600 mg total) by mouth 3 (three) times daily.   ibuprofen (ADVIL) 600 MG tablet Take 1 tablet (600 mg total) by mouth 3 (three) times daily.   isosorbide mononitrate (IMDUR) 60 MG 24 hr tablet Take 1 tablet (60 mg total) by mouth daily.   lidocaine (HM LIDOCAINE PATCH) 4 % Place 1 patch onto the skin daily.   losartan (COZAAR) 100 MG tablet TAKE 1 TABLET(100 MG) BY MOUTH DAILY   meloxicam (MOBIC) 15 MG tablet Take 1 tablet (15 mg total) by mouth daily.   methylPREDNISolone (MEDROL DOSEPAK) 4 MG TBPK tablet Take as directed   Misc. Devices (TOPI-CLICK VAGINAL DOSING) MISC Apply 100,000 units Nystatin suppository in vagina QHS for 14 days to treat candida glabrata.   Multiple Vitamin (MULITIVITAMIN WITH MINERALS) TABS Take 1 tablet by mouth  daily.   nitroGLYCERIN (NITROSTAT) 0.4 MG SL tablet PLACE 1 TABLET UNDER THE TONGUE EVERY 5 MINUTES AS NEEDED   ondansetron (ZOFRAN-ODT) 4 MG disintegrating tablet Take 1 tablet (4 mg total) by mouth every 8 (eight) hours as needed for nausea or vomiting.   potassium chloride SA (KLOR-CON M) 20 MEQ tablet Take 1 tablet (20 mEq total) by mouth 2 (two) times daily.    Family History    Family History  Problem Relation Age of Onset   Hypertension Mother    Heart disease Mother        has a pacemaker    Alzheimer's disease Mother    Hyperlipidemia Mother    Osteoporosis Mother    Heart failure Father    Hypertension Father    Alzheimer's disease Father    Hypertension Sister    Breast cancer Sister    Hypertension Sister    Gout Sister    Heart disease Son    Alcohol abuse Son    Pancreatitis Son    Hypertension Son    Cancer Neg Hx    Colon cancer Neg Hx    Esophageal cancer Neg Hx    Stomach cancer Neg Hx    Rectal cancer Neg Hx    She indicated that her mother is deceased. She indicated that her father is deceased. She indicated that only one of her two sisters is alive. She indicated that both of her sons are alive. She indicated that the status of her neg hx is unknown.   Social History    Social History   Socioeconomic History   Marital status: Single    Spouse name: Not on file   Number of children: 2   Years of education: 12   Highest education level: 12th grade  Occupational History   Occupation: Custodian    Employer: [employer]    Comment: RETIRED  Tobacco Use   Smoking status: Former    Types: Cigarettes    Quit date: 07/29/2010    Years since quitting: 11.6    Passive exposure: Past   Smokeless tobacco: Never  Vaping Use   Vaping Use: Never used  Substance and Sexual Activity   Alcohol use: Not Currently   Drug use: Never   Sexual activity: Not Currently    Birth control/protection: Surgical    Comment: 1st intercourse 72 yo-Fewer  than 5 partners  Other Topics Concern   Not on file  Social History Narrative   Patient and her one of her sons live together in [place].   Patient has another son who resides in [place].   Never married      Patient enjoys spending time at a senior recreational center. Senior activities and dinners.       Patient walks ~15 minutes per day.   Social Determinants of Health   Financial Resource Strain: Low Risk  (11/01/2021)   Overall Financial Resource Strain (CARDIA)    Difficulty of Paying Living Expenses: Not hard at all  Food Insecurity: No Food Insecurity (11/01/2021)   Hunger Vital Sign    Worried About Running Out of Food in the Last Year: Never true    Ran Out of Food in the Last Year: Never true  Transportation Needs: No Transportation Needs (11/01/2021)   PRAPARE - Administrator, Civil Service (Medical): No    Lack of Transportation (Non-Medical): No  Physical Activity: Insufficiently Active (11/01/2021)   Exercise Vital Sign    Days of Exercise per Week: 7 days    Minutes of Exercise per Session: 20 min  Stress: No Stress Concern Present (11/01/2021)   [employer] of Occupational Health - Occupational Stress Questionnaire    Feeling of Stress : Only a Denise  Social Connections: Moderately Integrated (11/01/2021)   Social Connection and Isolation Panel [NHANES]    Frequency of Communication with Friends and Family: More than three times a week    Frequency of Social Gatherings with Friends and Family: More than three times a week    Attends Religious Services: More than 4 times per year    Active Member of [employer] or Organizations: Yes    Attends Banker Meetings: More than 4 times per year    Marital Status: Never married  Intimate Partner Violence: Not At Risk (11/01/2021)   Humiliation, Afraid, Rape, and Kick questionnaire    Fear of Current or Ex-Partner: No    Emotionally Abused: No    Physically Abused: No    Sexually Abused: No      Review of Systems    General:  No chills, fever, night sweats or weight changes.  Cardiovascular:  No chest pain, dyspnea on exertion, orthopnea, palpitations, paroxysmal nocturnal dyspnea. Bilateral ankle edema.  Dermatological: No rash, lesions/masses Respiratory: No cough, dyspnea Urologic: No hematuria, dysuria Abdominal:   No nausea, vomiting, diarrhea, bright red blood per rectum, melena, or hematemesis Neurologic:  No visual changes, weakness, changes in mental status. All other systems reviewed and are otherwise negative except as noted above.  Physical Exam    VS:  BP 128/76 (BP Location: Left Arm, Patient Position: Sitting, Cuff Size: Large)   Pulse (!) 59   Ht 5\' 5"  (1.651 m)   Wt 200 lb (90.7 kg)   BMI 33.28 kg/m  , BMI Body mass index is 33.28 kg/m. GEN:  Well nourished, well developed, in no acute distress. HEENT: Normal. Neck: Supple, no JVD, carotid bruits, or masses. Cardiac: RRR, no murmurs, rubs, or gallops. No clubbing, cyanosis.  Radials/DP/PT 2+ and equal bilaterally. Very mild edema bilateral ankles.  Respiratory:  Respirations regular and unlabored, clear to auscultation bilaterally. GI: Soft, nontender, nondistended. MS: No deformity or atrophy. Skin: Warm and dry, no rash. Neuro: Strength and sensation are intact. Psych: Normal affect.  Accessory Clinical Findings    Recent Labs: 09/17/2021: Hemoglobin 11.6; Platelets 305 12/28/2021: BUN 17; Creatinine, Ser 1.11; Magnesium 1.8; Potassium 4.4; Sodium 143   Recent Lipid Panel    Component Value Date/Time   CHOL 189 12/28/2021 1416   TRIG 196 (H) 12/28/2021 1416   HDL 50  12/28/2021 1416   CHOLHDL 3.8 12/28/2021 1416   CHOLHDL 4.7 10/01/2015 0732   VLDL 27 10/01/2015 0732   LDLCALC 105 (H) 12/28/2021 1416   ECG not indicated today.   Assessment & Plan   CAD.  LHC September 2022 showed severe single vessel CAD with chronic subtotal occlusion of mid to distal RCA, aggressive secondary  prevention was recommended.  Patient denies chest pain, pressure or tightness. No DOE.  Continue amlodipine, aspirin, atorvastatin, Zetia, carvedilol, isosorbide, and as needed SL NTG.  Patient is encouraged to begin walking program as intended. Chronic diastolic heart failure.  Heart catheterization September 2022 showed LVEDP 25-30.  Patient reports mild ankle edema. Uncertain if it improves with elevation. She denies shortness of breath at rest, dyspnea on exertion, or abdominal bloating/fullness. Her weight is stable. She is euvolemic and well compensated on exam with clear breath sounds and very mild edema to bilateral ankles. No JVD. Patient reports brisk diuresis with daily Lasix. Discussed compression stockings and elevation. Patient is not volume up. Ankle edema could be secondary to amlodipine. Will not make changes at this time.  Patient is instructed to call office if she develops increased shortness of breath, edema, or DOE.  Continue carvedilol, losartan, and Lasix. Hypertension.  BP today 128/76.  Patient denies dizziness or headaches.  Continue amlodipine, carvedilol, and losartan. Hyperlipidemia.  LDL October 2023 105, not at goal.  Continue atorvastatin and Zetia.  There is a question of medication adherence.  Patient has an upcoming appointment with pharmacy for lipids and medication reconciliation.     Disposition: Keep pharmacy appointment 04/17/2022.  Patient is instructed to bring all medication bottles with her to this appointment.  Follow-up in 3 months or sooner as needed.   Etta Grandchild. Sagan Maselli, DNP, NP-C     03/30/2022, 1:08 PM [facility] [address] Office [phone] Fax [phone]

## 2022-03-30 ENCOUNTER — Ambulatory Visit: Payer: Medicare Other | Attending: Nurse Practitioner | Admitting: Student

## 2022-03-30 ENCOUNTER — Encounter: Payer: Self-pay | Admitting: Nurse Practitioner

## 2022-03-30 VITALS — BP 128/76 | HR 59 | Ht 65.0 in | Wt 200.0 lb

## 2022-03-30 DIAGNOSIS — I10 Essential (primary) hypertension: Secondary | ICD-10-CM

## 2022-03-30 DIAGNOSIS — I5032 Chronic diastolic (congestive) heart failure: Secondary | ICD-10-CM | POA: Diagnosis not present

## 2022-03-30 DIAGNOSIS — I2510 Atherosclerotic heart disease of native coronary artery without angina pectoris: Secondary | ICD-10-CM | POA: Diagnosis not present

## 2022-03-30 DIAGNOSIS — E785 Hyperlipidemia, unspecified: Secondary | ICD-10-CM | POA: Diagnosis not present

## 2022-03-30 NOTE — Patient Instructions (Addendum)
Medication Instructions:  Your physician recommends that you continue on your current medications as directed. Please refer to the Current Medication list given to you today.   *If you need a refill on your cardiac medications before your next appointment, please call your pharmacy*   Lab Work: NONE ordered at this time of appointment   If you have labs (blood work) drawn today and your tests are completely normal, you will receive your results only by: [place] (if you have MyChart) OR A paper copy in the mail If you have any lab test that is abnormal or we need to change your treatment, we will call you to review the results.   Testing/Procedures: NONE ordered at this time of appointment     Follow-Up: At [facility], you and your health needs are our priority.  As part of our continuing mission to provide you with exceptional heart care, we have created designated Provider Care Teams.  These Care Teams include your primary Cardiologist (physician) and Advanced Practice Providers (APPs -  Physician Assistants and Nurse Practitioners) who all work together to provide you with the care you need, when you need it.  We recommend signing up for the patient portal called "MyChart".  Sign up information is provided on this After Visit Summary.  MyChart is used to connect with patients for Virtual Visits (Telemedicine).  Patients are able to view lab/test results, encounter notes, upcoming appointments, etc.  Non-urgent messages can be sent to your provider as well.   To learn more about what you can do with MyChart, go to [email or web address].    Your next appointment:   3 month(s)  The format for your next appointment:   In Person  Provider:   Any APP or Mayra Reel        Other Instructions Please bring all your medications to your Pharm-D appointment.  Please elevate legs when sitting.     Important Information About Sugar

## 2022-03-31 ENCOUNTER — Other Ambulatory Visit: Payer: Self-pay

## 2022-03-31 ENCOUNTER — Telehealth: Payer: Self-pay | Admitting: Cardiology

## 2022-03-31 MED ORDER — ISOSORBIDE MONONITRATE ER 60 MG PO TB24: 60.0000 mg | ORAL_TABLET | Freq: Every day | ORAL | 0 refills | Status: DC

## 2022-03-31 NOTE — Telephone Encounter (Signed)
 *  STAT* If patient is at the pharmacy, call can be transferred to refill team.   1. Which medications need to be refilled? (please list name of each medication and dose if known)   isosorbide mononitrate (IMDUR) 60 MG 24 hr tablet    2. Which pharmacy/location (including street and city if local pharmacy) is medication to be sent to? [facility] [phone] - [place], [address] AT [address]   3. Do they need a 30 day or 90 day supply? 90 days

## 2022-03-31 NOTE — Telephone Encounter (Signed)
FYI--Patient returned call. She is aware of refill.

## 2022-03-31 NOTE — Telephone Encounter (Signed)
Medication refilled and sent to desired pharmacy, lvm for patient to call back.

## 2022-04-03 ENCOUNTER — Encounter (HOSPITAL_COMMUNITY): Payer: Self-pay | Admitting: *Deleted

## 2022-04-03 ENCOUNTER — Ambulatory Visit (INDEPENDENT_AMBULATORY_CARE_PROVIDER_SITE_OTHER): Payer: 59

## 2022-04-03 ENCOUNTER — Ambulatory Visit (HOSPITAL_COMMUNITY)
Admission: EM | Admit: 2022-04-03 | Discharge: 2022-04-03 | Disposition: A | Discharge status: Home / Self Care | Payer: 59 | Attending: Physician Assistant | Admitting: Physician Assistant

## 2022-04-03 ENCOUNTER — Other Ambulatory Visit: Payer: Self-pay

## 2022-04-03 DIAGNOSIS — M25552 Pain in left hip: Secondary | ICD-10-CM | POA: Diagnosis not present

## 2022-04-03 DIAGNOSIS — R109 Unspecified abdominal pain: Secondary | ICD-10-CM

## 2022-04-03 DIAGNOSIS — I10 Essential (primary) hypertension: Secondary | ICD-10-CM | POA: Diagnosis not present

## 2022-04-03 LAB — POCT URINALYSIS DIPSTICK, ED / UC
Bilirubin Urine: NEGATIVE
Glucose, UA: NEGATIVE mg/dL
Hgb urine dipstick: NEGATIVE
Ketones, ur: NEGATIVE mg/dL
Leukocytes,Ua: NEGATIVE
Nitrite: NEGATIVE
Protein, ur: NEGATIVE mg/dL
Specific Gravity, Urine: 1.015 (ref 1.005–1.030)
Urobilinogen, UA: 0.2 mg/dL (ref 0.0–1.0)
pH: 5 (ref 5.0–8.0)

## 2022-04-03 MED ORDER — IBUPROFEN 600 MG PO TABS: 600.0000 mg | ORAL_TABLET | Freq: Three times a day (TID) | ORAL | 0 refills | Status: DC | PRN

## 2022-04-03 MED ORDER — LIDOCAINE 4 % EX PTCH: 1.0000 | MEDICATED_PATCH | CUTANEOUS | 0 refills | Status: DC

## 2022-04-03 NOTE — Discharge Instructions (Signed)
Your urine was normal with no evidence of infection or blood.  Your x-ray was normal with no evidence of obstruction or kidney stones.  I am concerned that you might have had a muscle injury.  Please use lidocaine patches as well as Tylenol/ibuprofen to help with your pain.  If you develop any abdominal pain, fever, nausea/vomiting interfere with oral intake, trouble going to the bathroom, not passing gas you need to be seen immediately.  Your blood pressure is very elevated.  Please make sure you are taking medication as prescribed.  Monitor this at home.  If you develop any chest pain, shortness of breath, headache, vision change, dizziness in setting of high blood pressure you need to go to the emergency room.  Follow-up with your primary care later this week for recheck and dose adjustment.

## 2022-04-03 NOTE — ED Triage Notes (Signed)
Pt reports ABD pain and ABD bloating for 3 days.

## 2022-04-03 NOTE — ED Provider Notes (Signed)
[facility]    CSN: [id number] Arrival date & time: 04/03/22  1357      History   Chief Complaint Chief Complaint  Patient presents with   Abdominal Pain    HPI [name] is a 72 y.o. female.   Patient presents today with a 3-day history of left side/abdominal/hip pain.  Denies any known injury or increase in activity prior to symptom onset.  Reports associated abdominal bloating but denies any change in bowel habits, nausea, vomiting, fever.  She does have a history of bowel obstruction in the past but reports that she has been having normal bowel movements with last bowel movement earlier today without blood or mucus.  She has tried Tylenol without improvement of symptoms.  She denies any urinary symptoms including frequency, urgency, hematuria pain is rated 8 on a 0-10 pain scale, described as aching, no aggravating relieving factors identified.  She denies any association with activity.    Past Medical History:  Diagnosis Date   Abnormal EKG 2012   hospitalized for T wave inversion in lateral leads with MSK chest pains, normal ECHO, negative trops, no cardiology consult. recent EKG 7/15 stable T wave inversions.    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   blood clot in the kidney    Patient Active Problem List   Diagnosis Date Noted   Scoliosis of lumbar spine 02/28/2022   Candida glabrata infection 02/28/2022   Daytime somnolence 01/12/2021   Atypical chest pain 12/13/2020   Abnormal cardiac CT angiography 12/13/2020   Contact dermatitis 01/14/2019   Vaginal itching 12/05/2018   Leg edema 08/28/2018   Thickened endometrium    Umbilical hernia without obstruction  and without gangrene 01/01/2018   Osteopenia 09/05/2017   Closed nondisplaced spiral fracture of shaft of right tibia 03/28/2017   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/30/2015   HLD (hyperlipidemia)    Aortic atherosclerosis ([place])    Back pain 07/08/2015   Scotoma of blind spot area in visual field 06/14/2015   Seasonal allergies 03/09/2015   Hypopigmentation 03/09/2015   Osteoarthritis of left knee 07/08/2014   DJD (degenerative joint disease), lumbar 07/08/2014   Osteoarthritis of right knee 07/08/2014   Urinary frequency 06/11/2014   Allergic rhinoconjunctivitis of both eyes 06/11/2014   Nearsightedness 04/27/2014   Abnormal EKG    Hypertension 12/11/2013    Past Surgical History:  Procedure Laterality Date   ABDOMINAL SURGERY  2010   for bowel blockage   COLON SURGERY  2010   bowel blockage   COLONOSCOPY  09/2014   HYSTEROSCOPY WITH D & C N/A 05/08/2018   Procedure: DILATATION AND CURETTAGE /HYSTEROSCOPY;  Surgeon: Mora Bellman, MD;  Location: [facility];  Service: Gynecology;  Laterality: N/A;   KNEE ARTHROSCOPY Left 09/25/2013   Procedure: LEFT KNEE ARTHROSCOPY WITH PARTIAL MEDIAL AND LATERAL  MENISCECTOMY/DEBRIDEMENT/MICRO FRACTURE MEDIAL FEMORAL CONDYLE, REMOVAL OF OSTEOCONDRAL FRAGMENT;  Surgeon: Johnn Hai, MD;  Location: [facility];  Service: Orthopedics;  Laterality: Left;   KNEE SURGERY Right 1999   LEFT HEART CATH AND CORONARY ANGIOGRAPHY N/A 12/13/2020   Procedure:  LEFT HEART CATH AND CORONARY ANGIOGRAPHY;  Surgeon: Nelva Bush, MD;  Location: [facility];  Service: Cardiovascular;  Laterality: N/A;   POLYPECTOMY     TEE WITHOUT CARDIOVERSION N/A 10/04/2015   Procedure: TRANSESOPHAGEAL ECHOCARDIOGRAM (TEE);  Surgeon: Sueanne Margarita, MD;  Location[facility];  Service: Cardiovascular;  Laterality: N/A;   TUBAL LIGATION  1978    OB History     Gravida  2   Para  2   Term      Preterm      AB       Living  2      SAB      IAB      Ectopic      Multiple      Live Births               Home Medications    Prior to Admission medications   Medication Sig Start Date End Date Taking? Authorizing Provider  lidocaine (HM LIDOCAINE PATCH) 4 % Place 1 patch onto the skin daily. 04/03/22  Yes Micheale Schlack K, PA-C  amLODipine (NORVASC) 5 MG tablet TAKE 1 TABLET(5 MG) BY MOUTH DAILY 12/07/21   Alen Bleacher, MD  aspirin EC 81 MG tablet Take 1 tablet (81 mg total) daily by mouth. 01/31/17   Clent Demark, PA-C  atorvastatin (LIPITOR) 80 MG tablet TAKE 1 TABLET(80 MG) BY MOUTH DAILY AT 6 PM 11/15/21   Donato Heinz, MD  carvedilol (COREG) 12.5 MG tablet Take 1 tablet (12.5 mg total) by mouth 2 (two) times daily with a meal. 02/20/22 05/21/22  Alen Bleacher, MD  diclofenac Sodium (VOLTAREN) 1 % GEL APPLY 2 GRAMS TOPICALLY TO THE AFFECTED AREA FOUR TIMES DAILY 03/29/22   Alen Bleacher, MD  ezetimibe (ZETIA) 10 MG tablet Take by mouth. 10/26/21   [provider]  fluconazole (DIFLUCAN) 150 MG tablet Take 1 tablet (150 mg total) by mouth daily. 02/17/22   Melynda Ripple, NP  fluticasone (FLONASE) 50 MCG/ACT nasal spray SHAKE LIQUID AND USE 2 SPRAYS IN EACH NOSTRIL TWICE DAILY 02/13/22   Alen Bleacher, MD  furosemide (LASIX) 40 MG tablet Take 1 tablet (40 mg total) by mouth daily. 02/22/22 02/22/23  Donato Heinz, MD  gabapentin (NEURONTIN) 300 MG capsule Take 300 mg by mouth 2 (two) times daily. 11/14/19   [provider]  ibuprofen (ADVIL) 600 MG tablet Take 1 tablet (600 mg total) by mouth 3 (three) times daily. 01/21/22   Nyoka Lint, PA-C  ibuprofen (ADVIL) 600 MG tablet Take 1 tablet (600 mg total) by mouth 3 (three) times daily. 03/19/22   Nyoka Lint, PA-C  isosorbide mononitrate (IMDUR) 60 MG 24 hr tablet Take 1 tablet (60 mg total) by mouth daily. 03/31/22   Donato Heinz, MD  losartan (COZAAR) 100 MG tablet TAKE 1 TABLET(100 MG) BY MOUTH  DAILY 02/15/22   Donato Heinz, MD  meloxicam (MOBIC) 15 MG tablet Take 1 tablet (15 mg total) by mouth daily. 12/07/21   Enrique Sack, FNP  methylPREDNISolone (MEDROL DOSEPAK) 4 MG TBPK tablet Take as directed 12/07/21   Enrique Sack, [place]. Devices (TOPI-CLICK VAGINAL DOSING) MISC Apply 100,000 units Nystatin suppository in vagina QHS for 14 days to treat candida glabrata. 03/02/22   Ezequiel Essex, MD  Multiple Vitamin (MULITIVITAMIN WITH MINERALS) TABS Take 1 tablet by mouth daily.    [provider]  nitroGLYCERIN (NITROSTAT) 0.4 MG SL tablet PLACE 1 TABLET UNDER THE  TONGUE EVERY 5 MINUTES AS NEEDED 01/09/22   Donato Heinz, MD  ondansetron (ZOFRAN-ODT) 4 MG disintegrating tablet Take 1 tablet (4 mg total) by mouth every 8 (eight) hours as needed for nausea or vomiting. 10/20/21   Barrett Henle, MD  potassium chloride SA (KLOR-CON M) 20 MEQ tablet Take 1 tablet (20 mEq total) by mouth 2 (two) times daily. 12/11/89   Delora Fuel, MD    Family History Family History  Problem Relation Age of Onset   Hypertension Mother    Heart disease Mother        has a pacemaker    Alzheimer's disease Mother    Hyperlipidemia Mother    Osteoporosis Mother    Heart failure Father    Hypertension Father    Alzheimer's disease Father    Hypertension Sister    Breast cancer Sister    Hypertension Sister    Gout Sister    Heart disease Son    Alcohol abuse Son    Pancreatitis Son    Hypertension Son    Cancer Neg Hx    Colon cancer Neg Hx    Esophageal cancer Neg Hx    Stomach cancer Neg Hx    Rectal cancer Neg Hx     Social History Social History   Tobacco Use   Smoking status: Former    Types: Cigarettes    Quit date: 07/29/2010    Years since quitting: 11.6    Passive exposure: Past   Smokeless tobacco: Never  Vaping Use   Vaping Use: Never used  Substance Use Topics   Alcohol use: Not Currently   Drug use: Never     Allergies    Shrimp [shellfish allergy], Latex, and Penicillins   Review of Systems Review of Systems  Constitutional:  Positive for activity change. Negative for appetite change, fatigue and fever.  Gastrointestinal:  Positive for abdominal pain. Negative for constipation, diarrhea, nausea and vomiting.  Genitourinary:  Positive for flank pain. Negative for dysuria, frequency, urgency, vaginal bleeding, vaginal discharge and vaginal pain.  Musculoskeletal:  Positive for back pain. Negative for arthralgias and myalgias.     Physical Exam Triage Vital Signs ED Triage Vitals  Enc Vitals Group     BP 04/03/22 1733 (!) 181/72     Pulse Rate 04/03/22 1733 66     Resp 04/03/22 1733 20     Temp 04/03/22 1733 98.3 F (36.8 C)     Temp src --      SpO2 04/03/22 1733 98 %     Weight --      Height --      Head Circumference --      Peak Flow --      Pain Score 04/03/22 1731 8     Pain Loc --      Pain Edu? --      Excl. in [place]? --    No data found.  Updated Vital Signs BP (!) 172/84   Pulse 74   Temp 98.3 F (36.8 C)   Resp 20   SpO2 96%   Visual Acuity Right Eye Distance:   Left Eye Distance:   Bilateral Distance:    Right Eye Near:   Left Eye Near:    Bilateral Near:     Physical Exam Vitals reviewed.  Constitutional:      General: She is awake. She is not in acute distress.    Appearance: Normal appearance. She is well-developed. She is not  ill-appearing.     Comments: Very pleasant female appears stated age in no acute distress sitting comfortably in exam room  HENT:     Head: Normocephalic and atraumatic.  Cardiovascular:     Rate and Rhythm: Normal rate and regular rhythm.     Heart sounds: Normal heart sounds, S1 normal and S2 normal. No murmur heard. Pulmonary:     Effort: Pulmonary effort is normal.     Breath sounds: Normal breath sounds. No wheezing, rhonchi or rales.     Comments: Clear to auscultation bilaterally Abdominal:     General: Bowel sounds are  normal.     Palpations: Abdomen is soft.     Tenderness: There is no abdominal tenderness. There is no right CVA tenderness, left CVA tenderness, guarding or rebound.  Musculoskeletal:     Cervical back: No tenderness or bony tenderness.     Thoracic back: No tenderness or bony tenderness.     Lumbar back: No tenderness or bony tenderness.       Back:     Comments: Back: Pain above left iliac crest; no tenderness palpation or deformity noted.  Pain is not reproducible on exam.  Psychiatric:        Behavior: Behavior is cooperative.      UC Treatments / Results  Labs (all labs ordered are listed, but only abnormal results are displayed) Labs Reviewed  POCT URINALYSIS DIPSTICK, ED / UC    EKG   Radiology DG Abdomen 1 View  Result Date: 04/03/2022 CLINICAL DATA:  Left flank pain. EXAM: ABDOMEN - 1 VIEW COMPARISON:  June 15, 2021. FINDINGS: The bowel gas pattern is normal. No definite nephrolithiasis is noted. Calcifications seen in the pelvis most consistent with degenerating fibroids. Mild amount of stool seen throughout the colon. Multilevel degenerative changes seen in lumbar spine. IMPRESSION: No abnormal bowel dilatation. Electronically Signed   By: Marijo Conception M.D.   On: 04/03/2022 18:20    Procedures Procedures (including critical care time)  Medications Ordered in UC Medications - No data to display  Initial Impression / Assessment and Plan / UC Course  I have reviewed the triage vital signs and the nursing notes.  Pertinent labs & imaging results that were available during my care of the patient were reviewed by me and considered in my medical decision making (see chart for details).     Patient is well-appearing, afebrile, nontoxic, nontachycardic.  Pain is over left hip but patient reports that it travels into her flank/abdomen.  Vitals and physical exam are reassuring today.  Abdominal x-ray was obtained that showed no acute abnormality or obvious radiopaque  calculi.  UA was obtained and was normal.  I am suspicious for musculoskeletal etiology given the location.  Will treat with lidocaine patches and she can use over-the-counter medication such as Tylenol ibuprofen for additional symptom relief.  Refill of ibuprofen was sent to the pharmacy to help with pain.  We discussed that she should limit use of this medication given elevated blood pressure to which she expressed understanding.  Recommended that she use heat and gentle stretch for additional symptom relief.  If she has any worsening or changing symptoms she needs to be seen immediately including abdominal pain, nausea, vomiting, fever, changes in her bowel habits, melena, hematochezia.  Strict return precautions given.  Recommended close follow-up with her primary care.  Blood pressure is very elevated today.  She was encouraged to follow-up with her primary care later this week for recheck.  She denies any signs/symptoms of endorgan damage.  Discussed that if she develops any chest pain, shortness of breath, headache, vision change, dizziness in setting of high blood pressure she is to go to the emergency room.  Strict return precautions given.  Final Clinical Impressions(s) / UC Diagnoses   Final diagnoses:  Left flank pain  Left hip pain  Elevated blood pressure reading in office with diagnosis of hypertension     Discharge Instructions      Your urine was normal with no evidence of infection or blood.  Your x-ray was normal with no evidence of obstruction or kidney stones.  I am concerned that you might have had a muscle injury.  Please use lidocaine patches as well as Tylenol/ibuprofen to help with your pain.  If you develop any abdominal pain, fever, nausea/vomiting interfere with oral intake, trouble going to the bathroom, not passing gas you need to be seen immediately.  Your blood pressure is very elevated.  Please make sure you are taking medication as prescribed.  Monitor this at  home.  If you develop any chest pain, shortness of breath, headache, vision change, dizziness in setting of high blood pressure you need to go to the emergency room.  Follow-up with your primary care later this week for recheck and dose adjustment.     ED Prescriptions     Medication Sig Dispense Auth. Provider   lidocaine (HM LIDOCAINE PATCH) 4 % Place 1 patch onto the skin daily. 5 patch Haydn Hutsell, Derry Skill, PA-C      PDMP not reviewed this encounter.   Terrilee Croak, PA-C 04/03/22 3299

## 2022-04-06 ENCOUNTER — Ambulatory Visit: Payer: 59

## 2022-04-07 ENCOUNTER — Ambulatory Visit
Admit: 2022-04-07 | Discharge: 2022-04-07 | Disposition: A | Discharge status: Home / Self Care | Payer: 59 | Attending: Obstetrics and Gynecology | Admitting: Obstetrics and Gynecology

## 2022-04-07 DIAGNOSIS — Z1231 Encounter for screening mammogram for malignant neoplasm of breast: Secondary | ICD-10-CM | POA: Diagnosis not present

## 2022-04-17 ENCOUNTER — Ambulatory Visit: Payer: 59 | Attending: Internal Medicine | Admitting: Pharmacist Clinician (PhC)/ Clinical Pharmacy Specialist

## 2022-04-17 ENCOUNTER — Encounter: Payer: Self-pay | Admitting: Pharmacist Clinician (PhC)/ Clinical Pharmacy Specialist

## 2022-04-17 DIAGNOSIS — E785 Hyperlipidemia, unspecified: Secondary | ICD-10-CM | POA: Diagnosis not present

## 2022-04-17 DIAGNOSIS — Z1389 Encounter for screening for other disorder: Secondary | ICD-10-CM

## 2022-04-17 NOTE — Progress Notes (Signed)
Office Visit    Patient Name: [name] Date of Encounter: 04/17/2022  Primary Care Provider:  Alen Bleacher, MD Primary Cardiologist:  Donato Heinz, MD  Chief Complaint    Hyperlipidemia   Significant Past Medical History   CAD Chronic subtotal occlusion mid-distal RCA on ASA, carvedilol, isosorbide, atorvastatin, ezetimibe  CHF HFpEF 65-70% by echo 9/22 on carvedilol, losartan, furosemide  HTN Controlled at last visit on amlodipine, carvedilol, losartan  Renal infarct 2017, completed 6 monhs anticoagulation than switched to aspirin     Allergies  Allergen Reactions   Shrimp [Shellfish Allergy] Hives and Itching   Latex Itching   Penicillins Itching and Rash    Has patient had a PCN reaction causing immediate rash, facial/tongue/throat swelling, SOB or lightheadedness with hypotension: unknown Has patient had a PCN reaction causing severe rash involving mucus membranes or skin necrosis: unknown Has patient had a PCN reaction that required hospitalization unknown Has patient had a PCN reaction occurring within the last 10 years: No If all of the above answers are "NO", then may proceed with Cephalosporin use.    History of Present Illness    [name] is a 72 y.o. female patient of Dr Gardiner Rhyme, in the office today to discuss options for lowering cholesterol.  Currently her chart notes she takes atorvastatin 80 mg and ezetimibe 10 mg daily, however her most recent LDL was 105.  In reviewing her chart she had a high LDL of 166 back in 2017.  Today she brings in a bag with all of her medications, but she isn't necessarily taking all the meds in the bag.  Several are 45-46 years old (from fill dates) that she just keeps around.  Patient notes that she previously used CPAP for OSA, however the company told her she wasn't using it enough hours of the day and the took it back.  She states that this is not correct, she would use all night long except for one middle of  the night excursion to the bathroom.  She also states compliance with all her current medications, however her ezetimibe prescription was last filled in August 2023 and still has about 2/3 of the 90 day supply.  Other bottles appear more appropriately timed.  She is confused about why she had to come into the office today.    Insurance Carrier: [employer] Complete [phone]  LDL Cholesterol goal:  LDL <70  Current Medications:   atorvastatin 80, ezetimibe 10  Family Hx:  mother had pacemaker, died at 41; father had heart issues, died at 32, natural causes; one sister with DM, hypertension; one son with high cholesterol; other son "got it all"  Social Hx: Tobacco: no, quit about 2 years ago Alcohol: none    Diet:  mostly meals at home, no take out; vegetables mix frozen and fresh, salads; meats consist of chicken, beef and fish, only minimal pork.  Has cut back on snack foods  Exercise: does some leg exercises in the morning; walks some around the block, about 15-20 minutes  Adherence Assessment  Do you ever forget to take your medication? '[]'$ Yes '[x]'$ No  Do you ever skip doses due to side effects? '[]'$ Yes '[x]'$ No  Do you have trouble affording your medicines? '[]'$ Yes '[x]'$ No  Are you ever unable to pick up your medication due to transportation difficulties? '[]'$ Yes '[x]'$ No  Do you ever stop taking your medications because you don't believe they are helping? '[]'$ Yes '[x]'$ No     Accessory Clinical Findings  Lab Results  Component Value Date   CHOL 189 12/28/2021   HDL 50 12/28/2021   LDLCALC 105 (H) 12/28/2021   TRIG 196 (H) 12/28/2021   CHOLHDL 3.8 12/28/2021    Lab Results  Component Value Date   ALT 20 06/19/2020   AST 27 06/19/2020   ALKPHOS 81 06/19/2020   BILITOT 0.4 06/19/2020   Lab Results  Component Value Date   CREATININE 1.11 (H) 12/28/2021   BUN 17 12/28/2021   NA 143 12/28/2021   K 4.4 12/28/2021   CL 105 12/28/2021   CO2 24 12/28/2021   Lab Results   Component Value Date   HGBA1C 5.9 01/31/2017    Home Medications    Current Outpatient Medications  Medication Sig Dispense Refill   amLODipine (NORVASC) 5 MG tablet TAKE 1 TABLET(5 MG) BY MOUTH DAILY 90 tablet 0   aspirin EC 81 MG tablet Take 1 tablet (81 mg total) daily by mouth. 30 tablet 11   atorvastatin (LIPITOR) 80 MG tablet TAKE 1 TABLET(80 MG) BY MOUTH DAILY AT 6 PM 90 tablet 3   carvedilol (COREG) 12.5 MG tablet Take 1 tablet (12.5 mg total) by mouth 2 (two) times daily with a meal. 60 tablet 2   diclofenac Sodium (VOLTAREN) 1 % GEL APPLY 2 GRAMS TOPICALLY TO THE AFFECTED AREA FOUR TIMES DAILY 200 g 0   ezetimibe (ZETIA) 10 MG tablet Take by mouth.     fluticasone (FLONASE) 50 MCG/ACT nasal spray SHAKE LIQUID AND USE 2 SPRAYS IN EACH NOSTRIL TWICE DAILY 16 g 1   furosemide (LASIX) 40 MG tablet Take 1 tablet (40 mg total) by mouth daily. 30 tablet 9   gabapentin (NEURONTIN) 300 MG capsule Take 300 mg by mouth 2 (two) times daily.     ibuprofen (ADVIL) 600 MG tablet Take 1 tablet (600 mg total) by mouth every 8 (eight) hours as needed. 12 tablet 0   isosorbide mononitrate (IMDUR) 60 MG 24 hr tablet Take 1 tablet (60 mg total) by mouth daily. 90 tablet 0   lidocaine (HM LIDOCAINE PATCH) 4 % Place 1 patch onto the skin daily. 5 patch 0   losartan (COZAAR) 100 MG tablet TAKE 1 TABLET(100 MG) BY MOUTH DAILY 90 tablet 3   meloxicam (MOBIC) 15 MG tablet Take 1 tablet (15 mg total) by mouth daily. 30 tablet 0   Multiple Vitamin (MULITIVITAMIN WITH MINERALS) TABS Take 1 tablet by mouth daily.     nitroGLYCERIN (NITROSTAT) 0.4 MG SL tablet PLACE 1 TABLET UNDER THE TONGUE EVERY 5 MINUTES AS NEEDED 25 tablet 3   potassium chloride SA (KLOR-CON M) 20 MEQ tablet Take 1 tablet (20 mEq total) by mouth 2 (two) times daily. 60 tablet 0   No current facility-administered medications for this visit.     Assessment & Plan    HLD (hyperlipidemia) Assessment: Patient with ASCVD not at LDL goal  of < 70 Most recent LDL 105 on 12/28/2021 Has been compliant with high intensity statin/ezetimibe : atorvastatin 80, ezetimibe 10 Not able to tolerate statins secondary to myalgias Reviewed options for lowering LDL cholesterol, including ezetimibe, PCSK-9 inhibitors, bempedoic acid and inclisiran.  Discussed mechanisms of action, dosing, side effects, potential decreases in LDL cholesterol and costs.  Also reviewed potential options for patient assistance. Would have liked to use inclisiran for her, to avoid medication confusion, but patient was not interested.  Plan: Patient agreeable to starting Nexlizet Will stop ezetimibe 10 mg for now, bottle marked with X so  she would not take.  Repeat labs after:  2 weeks Uric acid BMET If labs show no indication of elevated uric acid, will have her continue Nexlizet and atorvastatin, repeat labs after 3 months Lipid Liver.    Tommy Medal, PharmD CPP [facility] [address] [place]  [place], [place] [address] [phone]  04/17/2022, 4:21 PM

## 2022-04-17 NOTE — Assessment & Plan Note (Addendum)
Assessment: Patient with ASCVD not at LDL goal of < 70 Most recent LDL 105 on 12/28/2021 Has been compliant with high intensity statin/ezetimibe : atorvastatin 80, ezetimibe 10 Not able to tolerate statins secondary to myalgias Reviewed options for lowering LDL cholesterol, including ezetimibe, PCSK-9 inhibitors, bempedoic acid and inclisiran.  Discussed mechanisms of action, dosing, side effects, potential decreases in LDL cholesterol and costs.  Also reviewed potential options for patient assistance. Would have liked to use inclisiran for her, to avoid medication confusion, but patient was not interested.  Plan: Patient agreeable to starting Nexlizet Will stop ezetimibe 10 mg for now, bottle marked with X so she would not take.  Repeat labs after:  2 weeks Uric acid BMET If labs show no indication of elevated uric acid, will have her continue Nexlizet and atorvastatin, repeat labs after 3 months Lipid Liver.

## 2022-04-17 NOTE — Patient Instructions (Signed)
Your Results:             Your most recent labs Goal  Total Cholesterol 189 < 200  Triglycerides 196 < 150  HDL (happy/good cholesterol) 50 > 40  LDL (lousy/bad cholesterol 105 < 70   Medication changes:  Start Nexlezet - 1 tablet daily.  After 2 weeks please come into the office (ideally about 2-3 days before you run out of the medication).    We will need to measure your uric acid level to determine if you are at risk for developing gout.      Patient Assistance:  The [employer] offers assistance to help pay for medication copays.  They will cover copays for  Thank you for choosing [facility]  .

## 2022-04-19 ENCOUNTER — Ambulatory Visit (HOSPITAL_COMMUNITY): Payer: 59 | Attending: Family Medicine

## 2022-04-19 ENCOUNTER — Ambulatory Visit (INDEPENDENT_AMBULATORY_CARE_PROVIDER_SITE_OTHER): Payer: 59 | Admitting: Family Medicine

## 2022-04-19 VITALS — BP 136/62 | HR 68 | Ht 65.0 in | Wt 201.8 lb

## 2022-04-19 DIAGNOSIS — R9431 Abnormal electrocardiogram [ECG] [EKG]: Secondary | ICD-10-CM | POA: Insufficient documentation

## 2022-04-19 DIAGNOSIS — J069 Acute upper respiratory infection, unspecified: Secondary | ICD-10-CM | POA: Diagnosis not present

## 2022-04-19 DIAGNOSIS — M170 Bilateral primary osteoarthritis of knee: Secondary | ICD-10-CM | POA: Diagnosis not present

## 2022-04-19 NOTE — Patient Instructions (Addendum)
It was wonderful to see you today.  Please bring ALL of your medications with you to every visit.   Today we talked about:  You have a viral URI - I don't suspect any signs of pneumonia or other severe disease based on your exam and symptoms.   Your EKG was reassuring with your heart- I think this is likely due to coughing.  For your knees- I recommend making a follow up appointment with your PCP to discuss restarting injections.   Thank you for choosing [facility].   Please call [phone] with any questions about today's appointment.  Please arrive at least 15 minutes prior to your scheduled appointments.   If you had blood work today, I will send you a MyChart message or a letter if results are normal. Otherwise, I will give you a call.   If you had a referral placed, they will call you to set up an appointment. Please give [place] a call if you don't hear back in the next 2 weeks.   If you need additional refills before your next appointment, please call your pharmacy first.   Yehuda Savannah, MD  Family Medicine

## 2022-04-19 NOTE — Progress Notes (Unsigned)
    SUBJECTIVE:   CHIEF COMPLAINT / HPI:   Sinus pressure-patient notes she has had pain in her sinuses and rhinorrhea for the past 2 days.  She notes that she just started coughing but without sputum or hemoptysis.  She denies any fevers, chills, body aches, chest pain, shortness of breath.  She does note that sometimes after she coughs she will have a few seconds of sharp pain on her chest wall.  She denies any sick contacts.  She is able to get around without chest pain or shortness of breath.  Knee pain-she notes having bilateral knee pain and swelling.  She had arthroscopy back in 2015 and feels like it has not gotten better than that.  She sometimes tries a lidocaine patch for her knees.  She is interested in considering injections again.  No erythema, no fevers no chills.  PERTINENT  PMH / PSH: CAD, renal infarct, HFpEF, HTN  OBJECTIVE:   BP 136/62   Pulse 68   Ht '5\' 5"'$  (1.651 m)   Wt 201 lb 12.8 oz (91.5 kg)   SpO2 98%   BMI 33.58 kg/m   General: A&O, NAD HEENT: No sign of trauma, EOM grossly intact Cardiac: RRR, no m/r/g Respiratory: CTAB, normal WOB, no w/c/r GI: Soft, NTTP, non-distended  Extremities: NTTP, no peripheral edema.  Bilateral knees diffusely tender to palpation along the joint line, small effusion appreciated on right Neuro: Normal gait, moves all four extremities appropriately. Psych: Appropriate mood and affect   ASSESSMENT/PLAN:   Viral URI Symptoms consistent with viral URI, with coughing from postnasal drip. Exam and oxygenation reassuring, patient not having any dyspnea, speaking in complete sentences and only coughing occasionally Due to her history of coronary disease and noting some atypical chest pain lasting a few seconds with coughing, performed an EKG which was read interpreted myself showing T wave inversions in leads II and aVF, V4 through V6, all of which were present on prior EKG.  No STEMI, no changes from previous EKG.  Thus I suspect  this is due to cough and we discussed strict return precautions for her. We discussed return precautions including sputum production, shortness of breath, chest pain, fevers or chills, she is in agreement with this plan.  Discussed Flonase and nasal saline for the rhinorrhea.  Osteoarthritis of both knees Discussed follow-up with PCP to consider restarting injections Offered referral back to sports medicine or orthopedics which she will think about and let us know     Lenoria Chime, [place]

## 2022-04-20 DIAGNOSIS — J069 Acute upper respiratory infection, unspecified: Secondary | ICD-10-CM | POA: Insufficient documentation

## 2022-04-20 NOTE — Assessment & Plan Note (Signed)
Discussed follow-up with PCP to consider restarting injections Offered referral back to sports medicine or orthopedics which she will think about and let us know

## 2022-04-20 NOTE — Assessment & Plan Note (Addendum)
Symptoms consistent with viral URI, with coughing from postnasal drip. Exam and oxygenation reassuring, patient not having any dyspnea, speaking in complete sentences and only coughing occasionally Due to her history of coronary disease and noting some atypical chest pain lasting a few seconds with coughing, performed an EKG which was read interpreted myself showing T wave inversions in leads II and aVF, V4 through V6, all of which were present on prior EKG.  No STEMI, no changes from previous EKG.  Thus I suspect this is due to cough and we discussed strict return precautions for her. We discussed return precautions including sputum production, shortness of breath, chest pain, fevers or chills, she is in agreement with this plan.  Discussed Flonase and nasal saline for the rhinorrhea.

## 2022-04-21 ENCOUNTER — Telehealth: Payer: Self-pay

## 2022-04-21 NOTE — Telephone Encounter (Signed)
Patient LVM on nurse line regarding continued knee pain. She was requesting further imaging.   Returned call to patient. She did not answer, LVM asking her to return call to discuss further.   Talbot Grumbling, RN

## 2022-05-05 ENCOUNTER — Ambulatory Visit (INDEPENDENT_AMBULATORY_CARE_PROVIDER_SITE_OTHER): Payer: 59 | Admitting: Student

## 2022-05-05 VITALS — BP 128/68 | HR 61 | Wt 189.6 lb

## 2022-05-05 DIAGNOSIS — M1712 Unilateral primary osteoarthritis, left knee: Secondary | ICD-10-CM

## 2022-05-05 DIAGNOSIS — Z23 Encounter for immunization: Secondary | ICD-10-CM

## 2022-05-05 MED ORDER — ZIKS ARTHRITIS PAIN RELIEF 0.025-1-12 % EX CREA: 1.0000 | TOPICAL_CREAM | Freq: Every day | CUTANEOUS | 0 refills | Status: DC

## 2022-05-05 MED ORDER — MELOXICAM 7.5 MG PO TABS: 7.5000 mg | ORAL_TABLET | Freq: Every day | ORAL | 0 refills | Status: DC

## 2022-05-05 NOTE — Patient Instructions (Signed)
It was wonderful to see you today. Thank you for allowing me to be a part of your care. Below is a short summary of what we discussed at your visit today:  Your left knee pain is most likely due to significant arthritis on the left knee.  I suspect your knee is currently bone-on-bone.  I have sent in a referral to an orthopedic doctor.  In the meantime I sent in prescription for meloxicam which you will take once daily for 7 days.  Completing the meloxicam you can do over-the-counter ibuprofen as needed   Please bring all of your medications to every appointment!  If you have any questions or concerns, please do not hesitate to contact us via phone or MyChart message.   Alen Bleacher, MD [facility]

## 2022-05-05 NOTE — Progress Notes (Signed)
    SUBJECTIVE:   CHIEF COMPLAINT / HPI:   Patient is a 72 year old female presenting today for bilateral knee pain.  Bilateral knee pain are constant , worse on the left than the right. She has history of chronic knee pain with reports of previous effusion and intra-articular joint injections in the past which patient said didn't provide significant relieve.  Also tried PT of the knee which she says in effective.  Knee x-ray about 5 months ago shows severe arthritis.  PERTINENT  PMH / PSH: Reviewed  OBJECTIVE:   BP 128/68   Pulse 61   Wt 189 lb 9.6 oz (86 kg)   SpO2 94%   BMI 31.55 kg/m     Physical Exam  General: Alert, well appearing, NAD  Knee Exam Mild effusion bilaterally.  No other gross deformity, ecchymoses. No TTP along joint line. FROM with normal strength. Negative ant/post drawers. Negative valgus/varus testing. Negative lachman. NV intact distally.   ASSESSMENT/PLAN:   Osteoarthritis of left knee Patient with significant arthritis of both knees, worse on the left. On exam has mild effusion and recent knee xray show severe arthritis. Discussed treatment option which include intraarticular knee injection and knee replacement. Informed patient given severity of arthritis in her knee she might not get much benefit with steroid injection if it hasn't been of much benefit in the past. Via shared decision agreed she would benefit from orthopedic referral. -Rx Meloxicam daily x7days -Rx Capsaicin cream to be applied on the knee -Placed referral to orthopedic surgery   HCM Patient received her COVID booster vaccine today. Risk and benefit discussed with patient.  Alen Bleacher, MD [place]

## 2022-05-05 NOTE — Assessment & Plan Note (Signed)
Patient with significant arthritis of both knees, worse on the left. On exam has mild effusion and recent knee xray show severe arthritis. Discussed treatment option which include intraarticular knee injection and knee replacement. Informed patient given severity of arthritis in her knee she might not get much benefit with steroid injection if it hasn't been of much benefit in the past. Via shared decision agreed she would benefit from orthopedic referral. -Rx Meloxicam daily x7days -Rx Capsaicin cream to be applied on the knee -Placed referral to orthopedic surgery

## 2022-05-17 ENCOUNTER — Ambulatory Visit (INDEPENDENT_AMBULATORY_CARE_PROVIDER_SITE_OTHER): Payer: 59 | Admitting: Physician Assistant

## 2022-05-17 ENCOUNTER — Encounter: Payer: Self-pay | Admitting: Physician Assistant

## 2022-05-17 VITALS — Ht 65.0 in | Wt 189.0 lb

## 2022-05-17 DIAGNOSIS — M1712 Unilateral primary osteoarthritis, left knee: Secondary | ICD-10-CM | POA: Diagnosis not present

## 2022-05-17 MED ORDER — METHYLPREDNISOLONE ACETATE 40 MG/ML IJ SUSP
80.0000 mg | INTRAMUSCULAR | Status: AC | PRN
  Administered 2022-05-17: 80 mg via INTRA_ARTICULAR

## 2022-05-17 MED ORDER — IBUPROFEN 400 MG PO TABS: 400.0000 mg | ORAL_TABLET | Freq: Three times a day (TID) | ORAL | 0 refills | Status: DC | PRN

## 2022-05-17 MED ORDER — BUPIVACAINE HCL 0.25 % IJ SOLN
2.0000 mL | INTRAMUSCULAR | Status: AC | PRN
  Administered 2022-05-17: 2 mL via INTRA_ARTICULAR

## 2022-05-17 MED ORDER — LIDOCAINE HCL 1 % IJ SOLN
2.0000 mL | INTRAMUSCULAR | Status: AC | PRN
  Administered 2022-05-17: 2 mL

## 2022-05-17 NOTE — Progress Notes (Signed)
Office Visit Note   Patient: [name]           Date of Birth: 1951/02/23           MRN: [id number] Visit Date: 05/17/2022              Requested by: Alen Bleacher, MD [address] [place],  [place] [address] PCP: Alen Bleacher, MD  Chief Complaint  Patient presents with  . Left Knee - Pain      HPI: Patient is a pleasant 72 year old woman with a chief complaint of left medial knee pain.  She denies any injuries.  She has had x-rays taken in September and does not report any change in her symptoms or any injuries.  She has had injections in the past and she thinks they help.  At the time in 2015 she did have a left knee arthroscopy with partial medial lateral meniscectomies and microfracture.  She takes ibuprofen for pain.  Assessment & Plan: Visit Diagnoses: Osteoarthritis left knee  Plan: We discussed treatment options.  She thinks she had a cortisone injection in the past and it helped we will give her steroid injection today.  She is also interested in pursuing viscosupplementation as she heard this was helpful.  I told her I would try and get authorization for this.  Will be called when this is available for her This patient is diagnosed with osteoarthritis of the knee(s).    Radiographs show evidence of joint space narrowing, osteophytes, subchondral sclerosis and/or subchondral cysts.  This patient has knee pain which interferes with functional and activities of daily living.    This patient has experienced inadequate response, adverse effects and/or intolerance with conservative treatments such as acetaminophen, NSAIDS, topical creams, physical therapy or regular exercise, knee bracing and/or weight loss.   This patient has experienced inadequate response or has a contraindication to intra articular steroid injections for at least 3 months.   This patient is not scheduled to have a total knee replacement within 6 months of starting treatment with viscosupplementation.   Follow-Up Instructions: For [facility]  Ortho Exam  Patient is alert, oriented, no adenopathy, well-dressed, normal affect, normal respiratory effort. Examination of her left knee mild soft tissue swelling but no erythema she has good arthroscopy portals that are healed.  Compartments are soft and nontender she is neurovascular intact.  No effusion.  She does have positive grinding with range of motion good varus valgus stability tender more of the medial and lateral joint line  Imaging: No results found. No images are attached to the encounter.  Labs: Lab Results  Component Value Date   HGBA1C 5.9 01/31/2017   HGBA1C 5.7 04/17/2016   HGBA1C 5.4 09/30/2015   ESRSEDRATE 22 01/21/2011   LABURIC 6.0 06/11/2014   REPTSTATUS 02/18/2022 FINAL 02/17/2022   CULT (A) 02/17/2022    <10,000 COLONIES/mL INSIGNIFICANT GROWTH Performed at [facility], [place] [address], [place],  [address]    LABORGA NO GROWTH 5 DAYS 10/14/2015   LABORGA NO GROWTH 5 DAYS 10/14/2015     Lab Results  Component Value Date   ALBUMIN 3.7 06/19/2020   ALBUMIN 3.7 05/25/2019   ALBUMIN 3.5 10/06/2018    Lab Results  Component Value Date   MG 1.8 12/28/2021   MG 2.0 10/18/2021   MG 2.0 06/14/2021   No results found for: "VD25OH"  No results found for: "PREALBUMIN"    Latest Ref Rng & Units 09/17/2021    6:52 PM  12/07/2020    4:43 PM 06/20/2020    5:13 PM  CBC EXTENDED  WBC 4.0 - 10.5 K/uL 13.3  10.4  11.3   RBC 3.87 - 5.11 MIL/uL 3.68  4.00  3.90   Hemoglobin 12.0 - 15.0 g/dL 11.6  12.5  12.6   HCT 36.0 - 46.0 % 35.9  36.7  38.2   Platelets 150 - 400 K/uL 305  303  292      Body mass index is 31.45 kg/m.  Orders:  No orders of the defined types were placed in this encounter.  Meds ordered this encounter  Medications  . ibuprofen (ADVIL) 400 MG tablet    Sig: Take 1 tablet (400 mg total) by mouth every 8 (eight) hours as needed.    Dispense:  30 tablet    Refill:  0      Procedures: Large Joint Inj: L knee on 05/17/2022 2:49 PM Indications: pain and diagnostic evaluation Details: 25 G 1.5 in needle, anteromedial approach  Arthrogram: No  Medications: 80 mg methylPREDNISolone acetate 40 MG/ML; 2 mL lidocaine 1 %; 2 mL bupivacaine 0.25 % Outcome: tolerated well, no immediate complications Procedure, treatment alternatives, risks and benefits explained, specific risks discussed. Consent was given by the patient.    Clinical Data: No additional findings.  ROS:  All other systems negative, except as noted in the HPI. Review of Systems  Objective: Vital Signs: Ht 5' 5"$  (1.651 m)   Wt 189 lb (85.7 kg)   BMI 31.45 kg/m   Specialty Comments:  No specialty comments available.  PMFS History: Patient Active Problem List   Diagnosis Date Noted  . Viral URI 04/20/2022  . Scoliosis of lumbar spine 02/28/2022  . Candida glabrata infection 02/28/2022  . Daytime somnolence 01/12/2021  . Atypical chest pain 12/13/2020  . Abnormal cardiac CT angiography 12/13/2020  . Contact dermatitis 01/14/2019  . Vaginal itching 12/05/2018  . Leg edema 08/28/2018  . Thickened endometrium   . Umbilical hernia without obstruction and without gangrene 01/01/2018  . Osteopenia 09/05/2017  . Closed nondisplaced spiral fracture of shaft of right tibia 03/28/2017  . 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 Renal infarct ([place]) 09/30/2015  . HLD (hyperlipidemia)   . Aortic atherosclerosis ([place])   . Back pain 07/08/2015  . 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 Seasonal allergies 03/09/2015  . Hypopigmentation 03/09/2015  . Unilateral primary osteoarthritis, left knee 07/08/2014  . DJD (degenerative joint disease), lumbar 07/08/2014  . Osteoarthritis of both knees 07/08/2014  . Urinary frequency 06/11/2014  . Allergic rhinoconjunctivitis of both eyes 06/11/2014  . Nearsightedness 04/27/2014  . Abnormal EKG   . Hypertension 12/11/2013   Past Medical  History:  Diagnosis Date  . Abnormal EKG 2012   hospitalized for T wave inversion in lateral leads with MSK chest pains, normal ECHO, negative trops, no cardiology consult. recent EKG 7/15 stable T wave inversions.   . 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 Arthritis 2000  . Arthritis 2011  . Bilateral leg pain 06/14/2015  . Clotting disorder ([place])    blood clot kidney 2017  . Heart murmur   . Hyperlipidemia 2011  . Hypertension 2011  . Left elbow pain 03/31/2020  . Meniscus tear 2000   L KNEE  . Pelvic pain 10/07/2018  . Renal infarct ([place]) 2017   blood clot in the kidney    Family History  Problem Relation Age of Onset  . Hypertension Mother   . Heart  disease Mother        has a pacemaker   . Alzheimer's disease Mother   . Hyperlipidemia Mother   . Osteoporosis Mother   . Heart failure Father   . Hypertension Father   . Alzheimer's disease Father   . Hypertension Sister   . Breast cancer Sister   . Hypertension Sister   . Gout Sister   . Heart disease Son   . Alcohol abuse Son   . Pancreatitis Son   . Hypertension Son   . Cancer Neg Hx   . Colon cancer Neg Hx   . Esophageal cancer Neg Hx   . Stomach cancer Neg Hx   . Rectal cancer Neg Hx     Past Surgical History:  Procedure Laterality Date  . ABDOMINAL SURGERY  2010   for bowel blockage  . COLON SURGERY  2010   bowel blockage  . COLONOSCOPY  09/2014  . HYSTEROSCOPY WITH D & C N/A 05/08/2018   Procedure: DILATATION AND CURETTAGE /HYSTEROSCOPY;  Surgeon: Mora Bellman, MD;  Location: [facility];  Service: Gynecology;  Laterality: N/A;  . KNEE ARTHROSCOPY Left 09/25/2013   Procedure: LEFT KNEE ARTHROSCOPY WITH PARTIAL MEDIAL AND LATERAL  MENISCECTOMY/DEBRIDEMENT/MICRO FRACTURE MEDIAL FEMORAL CONDYLE, REMOVAL OF OSTEOCONDRAL FRAGMENT;  Surgeon: Johnn Hai, MD;  Location: [facility];  Service: Orthopedics;  Laterality: Left;  . KNEE SURGERY Right 1999  . LEFT HEART CATH AND CORONARY ANGIOGRAPHY N/A  12/13/2020   Procedure: LEFT HEART CATH AND CORONARY ANGIOGRAPHY;  Surgeon: Nelva Bush, MD;  Location: [facility];  Service: Cardiovascular;  Laterality: N/A;  . POLYPECTOMY    . TEE WITHOUT CARDIOVERSION N/A 10/04/2015   Procedure: TRANSESOPHAGEAL ECHOCARDIOGRAM (TEE);  Surgeon: Sueanne Margarita, MD;  Location: [facility];  Service: Cardiovascular;  Laterality: N/A;  . [place] History   Occupational History  . Occupation: Custodian    Employer: [employer]    Comment: RETIRED  Tobacco Use  . Smoking status: Former    Types: Cigarettes    Quit date: 07/29/2010    Years since quitting: 11.8    Passive exposure: Past  . Smokeless tobacco: Never  Vaping Use  . Vaping Use: Never used  Substance and Sexual Activity  . Alcohol use: Not Currently  . Drug use: Never  . Sexual activity: Not Currently    Birth control/protection: Surgical    Comment: 1st intercourse 72 yo-Fewer than 5 partners

## 2022-05-18 ENCOUNTER — Telehealth: Payer: Self-pay

## 2022-05-18 NOTE — Telephone Encounter (Signed)
VOB submitted for Durolane, left knee.

## 2022-05-30 ENCOUNTER — Other Ambulatory Visit: Payer: Self-pay | Admitting: Student

## 2022-05-30 ENCOUNTER — Other Ambulatory Visit: Payer: Self-pay | Admitting: Cardiology

## 2022-05-30 DIAGNOSIS — I10 Essential (primary) hypertension: Secondary | ICD-10-CM

## 2022-05-30 DIAGNOSIS — R079 Chest pain, unspecified: Secondary | ICD-10-CM

## 2022-06-02 ENCOUNTER — Other Ambulatory Visit: Payer: Self-pay | Admitting: Student

## 2022-06-02 DIAGNOSIS — M1712 Unilateral primary osteoarthritis, left knee: Secondary | ICD-10-CM

## 2022-06-26 NOTE — Progress Notes (Unsigned)
Cardiology Clinic Note   Date: 06/29/2022 ID: Deztinee, Aiken 11-21-1950, MRN [id number]  Primary Cardiologist:  Donato Heinz, MD  Patient Profile    [name] is a 72 y.o. female who presents to the clinic today for follow-up.  Past medical history significant for: CAD. Coronary CT 11/26/2020: Calcium score 936 (97th percentile).  Moderate CAD mid and distal RCA. FFR showed possible total occlusion mid to distal RCA and indeterminate FFR value distal LAD.  Aortic atherosclerosis. LHC 12/13/2020: Severe single-vessel CAD with chronic subtotal occlusion distal RCA with left-to-right collaterals.  Mild disease proximal LAD.  Aggressive secondary prevention recommended. Chronic diastolic heart failure. LVEDP 25-30 per cath September 2022. Aortic atherosclerosis. Coronary CT 11/26/2020: Aortic atherosclerosis.  Hypertension.  Hyperlipidemia.  Lipid panel 12/28/2021: LDL 105, HDL 50, TG 196, total 189. Renal infarct.  Hospital admission July 6 through 10, 2017 for abdominal pain.  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   MRI abdomen W/WO contrast 09/30/2015: 2 wedge-shaped areas of hypoperfusion in the upper pole of the left kidney.  Negative UA, pyelonephritis unlikely.  Findings most likely reflect renal infarcts. Echo 10/01/2015: EF 65-70%. Grade I DD.  Blood cultures: Negative.   Completed 6 months of anticoagulation on warfarin and was then positioned to aspirin. Aortic valve vegetation. TEE 10/04/2015: EF 60 to 65%.  Mild LVH.  Trivial AR. 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     History of Present Illness    [name] was first evaluated by Dr. Gardiner Rhyme on 11/18/2020 for chest pain at the request of Dr. Andria Frames. At that time she complained of a several month history of sharp left lower chest pain lasting approximately 5 minutes and occurring at times of stress.  Coronary CT showed a calcium score of  936 and moderate CAD of mid and distal RCA.  Left heart catheterization showed severe chronic subtotal occlusion of the distal RCA with left-to-right collaterals and mild disease in the proximal LAD.  Aggressive secondary prevention was recommended.   Patient was last seen in the office by me on 03/30/2022.  At that time patient was doing well and no medication changes were made.  Patient was evaluated by Pharm.D. on 04/17/2022.  At that time Zetia was discontinued and patient was started on Nexlizet.  Today, patient is doing well. She denies shortness of breath or dyspnea on exertion. No chest pain, pressure, or tightness. Denies orthopnea or PND. No palpitations.  Patient has occasional bilateral ankle edema that resolves with elevation. She reports brisk diuresis with Lasix. She has been staying active by performing lower extremity stretching exercise, walking 1/4 every day that weather permits. She is planning on joining the [facility] to do aerobics and possibly get in the pool for exercise. She has been eating fresh fruit instead of sweets and she is restricting her sodium. Discussed recommended salt alternatives to include Mrs. Dash products. Instructed patient to read ingredients and not consume "no salt" products that contain potassium. She expressed understanding. BP at home is typically <130/80.      ROS: All other systems reviewed and are otherwise negative except as noted in History of Present Illness.  Studies Reviewed    ECG is not ordered today.       Physical Exam    VS:  BP 138/60 (BP Location: Left Arm, Patient Position: Sitting, Cuff Size: Normal)   Pulse 70   Ht 5\' 5"  (1.651 m)  Wt 194 lb (88 kg)   BMI 32.28 kg/m  , BMI Body mass index is 32.28 kg/m.  GEN: Well nourished, well developed, in no acute distress. Neck: No JVD or carotid bruits. Cardiac: RRR. No murmurs. No rubs or gallops.   Respiratory:  Respirations regular and unlabored. Clear to  auscultation without rales, wheezing or rhonchi. GI: Soft, nontender, nondistended. Extremities: Radials/DP/PT 2+ and equal bilaterally. No clubbing or cyanosis. Trace edema bilateral ankles.  Skin: Warm and dry, no rash. Neuro: Strength intact.  Assessment & Plan   CAD.  LHC September 2022 showed severe single vessel CAD with chronic subtotal occlusion of mid to distal RCA, aggressive secondary prevention was recommended.  Patient denies chest pain, pressure or tightness. No DOE.  Continue amlodipine, aspirin, atorvastatin, carvedilol, isosorbide, and as needed SL NTG.  Chronic diastolic heart failure.  Heart catheterization September 2022 showed LVEDP 25-30.  Patient reports mild bilateral ankle edema. No DOE with walking or stretching exercises. No shortness of breath at rest. Brisk diuresis on Lasix. Euvolemic and well compensated on exam.  Continue carvedilol, losartan, and Lasix. Will get a BMP today.  Hypertension.  BP today 138/60 at intake and 128/60 at recheck. Patient denies dizziness or headaches.  Continue amlodipine, carvedilol, and losartan. Hyperlipidemia.  LDL October 2023 105, not at goal.  Patient was seen by Pharm D lipid clinic. Zetia was stopped and Nexlizet was started.   Disposition: BMP today. Return in 6 months or sooner as needed.         Signed, Justice Britain. Brylinn Teaney, DNP, NP-C

## 2022-06-27 ENCOUNTER — Telehealth: Payer: Self-pay

## 2022-06-27 NOTE — Telephone Encounter (Signed)
Patient LVM on nurse line requesting returned call from nurse.   Called patient back. She states that she needed to know the address of her cardiology appointment. She said that since leaving VM, she was able to find the address.   No further questions at this time.   Talbot Grumbling, RN

## 2022-06-29 ENCOUNTER — Encounter: Payer: Self-pay | Admitting: Student

## 2022-06-29 ENCOUNTER — Ambulatory Visit: Payer: 59 | Attending: Student | Admitting: Student

## 2022-06-29 VITALS — BP 138/60 | HR 70 | Ht 65.0 in | Wt 194.0 lb

## 2022-06-29 DIAGNOSIS — E785 Hyperlipidemia, unspecified: Secondary | ICD-10-CM

## 2022-06-29 DIAGNOSIS — I10 Essential (primary) hypertension: Secondary | ICD-10-CM

## 2022-06-29 DIAGNOSIS — I2510 Atherosclerotic heart disease of native coronary artery without angina pectoris: Secondary | ICD-10-CM | POA: Diagnosis not present

## 2022-06-29 DIAGNOSIS — I5032 Chronic diastolic (congestive) heart failure: Secondary | ICD-10-CM | POA: Diagnosis not present

## 2022-06-29 DIAGNOSIS — Z79899 Other long term (current) drug therapy: Secondary | ICD-10-CM | POA: Diagnosis not present

## 2022-06-29 NOTE — Patient Instructions (Signed)
Medication Instructions:  Your physician recommends that you continue on your current medications as directed. Please refer to the Current Medication list given to you today.  *If you need a refill on your cardiac medications before your next appointment, please call your pharmacy*   Lab Work: Your physician recommends that you lab work today: BMET   If you have labs (blood work) drawn today and your tests are completely normal, you will receive your results only by: MyChart Message (if you have MyChart) OR A paper copy in the mail If you have any lab test that is abnormal or we need to change your treatment, we will call you to review the results.   Testing/Procedures: None ordered   Follow-Up: At [facility], you and your health needs are our priority.  As part of our continuing mission to provide you with exceptional heart care, we have created designated Provider Care Teams.  These Care Teams include your primary Cardiologist (physician) and Advanced Practice Providers (APPs -  Physician Assistants and Nurse Practitioners) who all work together to provide you with the care you need, when you need it.  We recommend signing up for the patient portal called "MyChart".  Sign up information is provided on this After Visit Summary.  MyChart is used to connect with patients for Virtual Visits (Telemedicine).  Patients are able to view lab/test results, encounter notes, upcoming appointments, etc.  Non-urgent messages can be sent to your provider as well.   To learn more about what you can do with MyChart, go to [email or web address].    Your next appointment:   6 month(s)  Provider:   Donato Heinz, MD

## 2022-06-30 LAB — BASIC METABOLIC PANEL
BUN/Creatinine Ratio: 14 (ref 12–28)
BUN: 14 mg/dL (ref 8–27)
CO2: 23 mmol/L (ref 20–29)
Calcium: 9.4 mg/dL (ref 8.7–10.3)
Chloride: 105 mmol/L (ref 96–106)
Creatinine, Ser: 0.99 mg/dL (ref 0.57–1.00)
Glucose: 94 mg/dL (ref 70–99)
Potassium: 3.7 mmol/L (ref 3.5–5.2)
Sodium: 145 mmol/L — ABNORMAL HIGH (ref 134–144)
eGFR: 61 mL/min/{1.73_m2} (ref >59)

## 2022-07-01 ENCOUNTER — Other Ambulatory Visit: Payer: Self-pay | Admitting: Student

## 2022-07-01 DIAGNOSIS — M1712 Unilateral primary osteoarthritis, left knee: Secondary | ICD-10-CM

## 2022-07-06 ENCOUNTER — Other Ambulatory Visit: Payer: Self-pay | Admitting: Cardiology

## 2022-07-06 ENCOUNTER — Other Ambulatory Visit: Payer: Self-pay

## 2022-07-06 DIAGNOSIS — I10 Essential (primary) hypertension: Secondary | ICD-10-CM

## 2022-07-06 DIAGNOSIS — R079 Chest pain, unspecified: Secondary | ICD-10-CM

## 2022-07-07 MED ORDER — FLUTICASONE PROPIONATE 50 MCG/ACT NA SUSP: NASAL | 1 refills | Status: DC

## 2022-07-10 ENCOUNTER — Ambulatory Visit (HOSPITAL_COMMUNITY)
Admission: EM | Admit: 2022-07-10 | Discharge: 2022-07-10 | Disposition: A | Discharge status: Home / Self Care | Payer: 59

## 2022-07-10 ENCOUNTER — Encounter (HOSPITAL_COMMUNITY): Payer: Self-pay

## 2022-07-10 ENCOUNTER — Emergency Department (HOSPITAL_COMMUNITY)
Admission: EM | Admit: 2022-07-10 | Discharge: 2022-07-11 | Disposition: A | Discharge status: Home / Self Care | Payer: 59 | Attending: Emergency Medicine | Admitting: Emergency Medicine

## 2022-07-10 ENCOUNTER — Emergency Department (HOSPITAL_COMMUNITY): Payer: 59

## 2022-07-10 ENCOUNTER — Other Ambulatory Visit: Payer: Self-pay

## 2022-07-10 DIAGNOSIS — Z7982 Long term (current) use of aspirin: Secondary | ICD-10-CM | POA: Diagnosis not present

## 2022-07-10 DIAGNOSIS — R030 Elevated blood-pressure reading, without diagnosis of hypertension: Secondary | ICD-10-CM

## 2022-07-10 DIAGNOSIS — R200 Anesthesia of skin: Secondary | ICD-10-CM | POA: Insufficient documentation

## 2022-07-10 DIAGNOSIS — I10 Essential (primary) hypertension: Secondary | ICD-10-CM

## 2022-07-10 DIAGNOSIS — R9089 Other abnormal findings on diagnostic imaging of central nervous system: Secondary | ICD-10-CM | POA: Diagnosis not present

## 2022-07-10 DIAGNOSIS — R29818 Other symptoms and signs involving the nervous system: Secondary | ICD-10-CM | POA: Diagnosis not present

## 2022-07-10 DIAGNOSIS — Z91040 Latex allergy status: Secondary | ICD-10-CM | POA: Insufficient documentation

## 2022-07-10 DIAGNOSIS — R202 Paresthesia of skin: Secondary | ICD-10-CM | POA: Insufficient documentation

## 2022-07-10 LAB — APTT: aPTT: 32 seconds (ref 24–36)

## 2022-07-10 LAB — COMPREHENSIVE METABOLIC PANEL
ALT: 17 U/L (ref 0–44)
AST: 18 U/L (ref 15–41)
Albumin: 3.6 g/dL (ref 3.5–5.0)
Alkaline Phosphatase: 111 U/L (ref 38–126)
Anion gap: 9 (ref 5–15)
BUN: 18 mg/dL (ref 8–23)
CO2: 25 mmol/L (ref 22–32)
Calcium: 9.2 mg/dL (ref 8.9–10.3)
Chloride: 104 mmol/L (ref 98–111)
Creatinine, Ser: 1.3 mg/dL — ABNORMAL HIGH (ref 0.44–1.00)
GFR, Estimated: 44 mL/min — ABNORMAL LOW (ref >60)
Glucose, Bld: 90 mg/dL (ref 70–99)
Potassium: 3.7 mmol/L (ref 3.5–5.1)
Sodium: 138 mmol/L (ref 135–145)
Total Bilirubin: 0.7 mg/dL (ref 0.3–1.2)
Total Protein: 7.7 g/dL (ref 6.5–8.1)

## 2022-07-10 LAB — DIFFERENTIAL
Abs Immature Granulocytes: 0.08 10*3/uL — ABNORMAL HIGH (ref 0.00–0.07)
Basophils Absolute: 0.1 10*3/uL (ref 0.0–0.1)
Basophils Relative: 1 %
Eosinophils Absolute: 0.4 10*3/uL (ref 0.0–0.5)
Eosinophils Relative: 3 %
Immature Granulocytes: 1 %
Lymphocytes Relative: 18 %
Lymphs Abs: 2.3 10*3/uL (ref 0.7–4.0)
Monocytes Absolute: 0.9 10*3/uL (ref 0.1–1.0)
Monocytes Relative: 7 %
Neutro Abs: 9.2 10*3/uL — ABNORMAL HIGH (ref 1.7–7.7)
Neutrophils Relative %: 70 %

## 2022-07-10 LAB — PROTIME-INR
INR: 1 (ref 0.8–1.2)
Prothrombin Time: 13.4 seconds (ref 11.4–15.2)

## 2022-07-10 LAB — CBC
HCT: 38.3 % (ref 36.0–46.0)
Hemoglobin: 12 g/dL (ref 12.0–15.0)
MCH: 30.8 pg (ref 26.0–34.0)
MCHC: 31.3 g/dL (ref 30.0–36.0)
MCV: 98.5 fL (ref 80.0–100.0)
Platelets: 331 10*3/uL (ref 150–400)
RBC: 3.89 MIL/uL (ref 3.87–5.11)
RDW: 15.3 % (ref 11.5–15.5)
WBC: 13 10*3/uL — ABNORMAL HIGH (ref 4.0–10.5)
nRBC: 0 % (ref 0.0–0.2)

## 2022-07-10 LAB — ETHANOL: Alcohol, Ethyl (B): <10 mg/dL (ref <10)

## 2022-07-10 NOTE — Discharge Instructions (Addendum)
Given you elevated blood pressure and new onset numbness, recommend you go directly to the ER for further evaluation and treatment.

## 2022-07-10 NOTE — ED Triage Notes (Signed)
Patient here for evaluation of bilateral lower extremity numbness that started two days ago. Patient also complains of right shoulder pain that started around the same time. Patient denies chest pain, is alert, oriented, and in no apparent distress at this time.

## 2022-07-10 NOTE — ED Provider Notes (Signed)
[facility]   Note:  This document was prepared using Dragon voice recognition software and may include unintentional dictation errors.  MRN: [id number] DOB: 02-Nov-1950 DATE: 07/10/22   Subjective:  Chief Complaint:  Chief Complaint  Patient presents with   Numbness     HPI: [name] is a 72 y.o. female presenting for new onset lower extremity numbness for the past 2 days right worse than left.  Patient has a past medical history of hypertension, hyperlipidemia, and atypical chest pain. She states she woke up yesterday morning and noticed a numbness in her right lower extremity. She states she has never had any similar symptoms before in the past. 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 Denies fever, chest pain, shortness of breath, headache, nausea/vomiting. Endorses numbness. Presents NAD.  Prior to Admission medications   Medication Sig Start Date End Date Taking? Authorizing Provider  amLODipine (NORVASC) 5 MG tablet TAKE 1 TABLET(5 MG) BY MOUTH DAILY 12/07/21   Jerre Simon, MD  aspirin EC 81 MG tablet Take 1 tablet (81 mg total) daily by mouth. 01/31/17   Loletta Specter, PA-C  atorvastatin (LIPITOR) 80 MG tablet TAKE 1 TABLET(80 MG) BY MOUTH DAILY AT 6 PM 11/15/21   Little Ishikawa, MD  Capsaicin-Menthol-Methyl Sal (CAPSAICIN-METHYL SAL-MENTHOL) 0.025-1-12 % CREA Apply 1 Tube topically daily. 05/05/22   Jerre Simon, MD  carvedilol (COREG) 12.5 MG tablet TAKE 1 TABLET(12.5 MG) BY MOUTH TWICE DAILY WITH A MEAL 05/30/22   Jerre Simon, MD  diclofenac Sodium (VOLTAREN) 1 % GEL APPLY 2 GRAMS TOPICALLY TO THE AFFECTED AREA FOUR TIMES DAILY 07/03/22   Jerre Simon, MD  ezetimibe (ZETIA) 10 MG tablet Take by mouth. 10/26/21   [provider]  fluticasone (FLONASE) 50 MCG/ACT nasal spray SHAKE LIQUID AND USE 2 SPRAYS IN EACH NOSTRIL TWICE DAILY 07/07/22   Jerre Simon, MD  furosemide (LASIX) 40 MG tablet Take 1 tablet (40 mg  total) by mouth daily. 02/22/22 02/22/23  Little Ishikawa, MD  gabapentin (NEURONTIN) 300 MG capsule Take 300 mg by mouth 2 (two) times daily. 11/14/19   [provider]  ibuprofen (ADVIL) 400 MG tablet Take 1 tablet (400 mg total) by mouth every 8 (eight) hours as needed. 05/17/22   Persons, [place], PA  isosorbide mononitrate (IMDUR) 60 MG 24 hr tablet Take 1 tablet (60 mg total) by mouth daily. 03/31/22   Little Ishikawa, MD  lidocaine (HM LIDOCAINE PATCH) 4 % Place 1 patch onto the skin daily. 04/03/22   Raspet, Noberto Retort, PA-C  losartan (COZAAR) 100 MG tablet TAKE 1 TABLET(100 MG) BY MOUTH DAILY 02/15/22   Little Ishikawa, MD  meloxicam (MOBIC) 7.5 MG tablet Take 1 tablet (7.5 mg total) by mouth daily. 05/05/22   Jerre Simon, MD  Multiple Vitamin (MULITIVITAMIN WITH MINERALS) TABS Take 1 tablet by mouth daily.    [provider]  nitroGLYCERIN (NITROSTAT) 0.4 MG SL tablet PLACE 1 TABLET UNDER THE TONGUE EVERY 5 MINUTES AS NEEDED. 07/06/22   Little Ishikawa, MD  potassium chloride SA (KLOR-CON M) 20 MEQ tablet Take 1 tablet (20 mEq total) by mouth 2 (two) times daily. 09/18/21   Dione Booze, MD     Allergies  Allergen Reactions   Shrimp [Shellfish Allergy] Hives and Itching   Latex Itching   Penicillins Itching and Rash    Has patient had a PCN reaction causing immediate rash, facial/tongue/throat swelling, SOB or lightheadedness with  hypotension: unknown Has patient had a PCN reaction causing severe rash involving mucus membranes or skin necrosis: unknown Has patient had a PCN reaction that required hospitalization unknown Has patient had a PCN reaction occurring within the last 10 years: No If all of the above answers are "NO", then may proceed with Cephalosporin use.    History:   Past Medical History:  Diagnosis Date   Abnormal EKG 2012   hospitalized for T wave inversion in lateral leads with MSK chest pains, normal ECHO, negative trops,  no cardiology consult. recent EKG 7/15 stable T wave inversions.    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   Arthritis 2000   Arthritis 2011   Bilateral leg pain 06/14/2015   Clotting disorder    blood clot kidney 2017   Heart murmur    Hyperlipidemia 2011   Hypertension 2011   Left elbow pain 03/31/2020   Meniscus tear 2000   L KNEE   Pelvic pain 10/07/2018   Renal infarct 2017   blood clot in the kidney     Past Surgical History:  Procedure Laterality Date   ABDOMINAL SURGERY  2010   for bowel blockage   COLON SURGERY  2010   bowel blockage   COLONOSCOPY  09/2014   HYSTEROSCOPY WITH D & C N/A 05/08/2018   Procedure: DILATATION AND CURETTAGE /HYSTEROSCOPY;  Surgeon: Catalina Antigua, MD;  Location: [facility];  Service: Gynecology;  Laterality: N/A;   KNEE ARTHROSCOPY Left 09/25/2013   Procedure: LEFT KNEE ARTHROSCOPY WITH PARTIAL MEDIAL AND LATERAL  MENISCECTOMY/DEBRIDEMENT/MICRO FRACTURE MEDIAL FEMORAL CONDYLE, REMOVAL OF OSTEOCONDRAL FRAGMENT;  Surgeon: Javier Docker, MD;  Location: [facility];  Service: Orthopedics;  Laterality: Left;   KNEE SURGERY Right 1999   LEFT HEART CATH AND CORONARY ANGIOGRAPHY N/A 12/13/2020   Procedure: LEFT HEART CATH AND CORONARY ANGIOGRAPHY;  Surgeon: Yvonne Kendall, MD;  Location: [facility];  Service: Cardiovascular;  Laterality: N/A;   POLYPECTOMY     TEE WITHOUT CARDIOVERSION N/A 10/04/2015   Procedure: TRANSESOPHAGEAL ECHOCARDIOGRAM (TEE);  Surgeon: Quintella Reichert, MD;  Location: [facility];  Service: Cardiovascular;  Laterality: N/A;   TUBAL LIGATION  1978    Family History  Problem Relation Age of Onset   Hypertension Mother    Heart disease Mother        has a pacemaker    Alzheimer's disease Mother    Hyperlipidemia Mother    Osteoporosis Mother    Heart failure Father    Hypertension Father    Alzheimer's disease Father    Hypertension Sister    Breast cancer Sister    Hypertension Sister    Gout Sister     Heart disease Son    Alcohol abuse Son    Pancreatitis Son    Hypertension Son    Cancer Neg Hx    Colon cancer Neg Hx    Esophageal cancer Neg Hx    Stomach cancer Neg Hx    Rectal cancer Neg Hx     Social History   Tobacco Use   Smoking status: Former    Types: Cigarettes    Quit date: 07/29/2010    Years since quitting: 11.9    Passive exposure: Past   Smokeless tobacco: Never  Vaping Use   Vaping Use: Never used  Substance Use Topics   Alcohol use: Not Currently   Drug use: Never    Review of Systems  Constitutional:  Negative for fever.  Respiratory:  Negative for  shortness of breath.   Cardiovascular:  Negative for chest pain.  Gastrointestinal:  Negative for abdominal pain, nausea and vomiting.  Musculoskeletal:  Positive for arthralgias.  Neurological:  Positive for numbness.     Objective:   Vitals: BP (!) 171/73   Pulse 60   Temp 97.9 F (36.6 C)   Resp 17   SpO2 96%   Physical Exam Constitutional:      General: She is not in acute distress.    Appearance: Normal appearance. She is well-developed. She is morbidly obese. She is not ill-appearing or toxic-appearing.  HENT:     Head: Normocephalic and atraumatic.  Cardiovascular:     Rate and Rhythm: Normal rate and regular rhythm.     Heart sounds: Normal heart sounds.  Pulmonary:     Effort: Pulmonary effort is normal.     Breath sounds: Normal breath sounds.     Comments: Clear to auscultation bilaterally  Abdominal:     General: Bowel sounds are normal.     Palpations: Abdomen is soft.     Tenderness: There is no abdominal tenderness.  Skin:    General: Skin is warm and dry.  Neurological:     General: No focal deficit present.     Mental Status: She is alert.     Cranial Nerves: Cranial nerves 2-12 are intact.     Comments: No facial drop noted on exam.  Psychiatric:        Mood and Affect: Mood and affect normal.     Results:  Labs: No results found for this or any previous  visit (from the past 24 hour(s)).  Radiology: No results found.   UC Course/Treatments:  Procedures: Procedures   Medications Ordered in UC: Medications - No data to display   Assessment and Plan :     ICD-10-CM   1. Essential hypertension  I10     2. Elevated blood pressure reading  R03.0     3. Numbness  R20.0      Essential hypertension: Afebrile, nontoxic-appearing, NAD. VSS. Active Patient states she has taken her blood pressure medication this morning.  She reports new onset numbness upon waking starting yesterday.  Elevated BP today in office at 171/73.   Patient presented to urgent care complaining of new onset numbness  During triage, blood pressure was noted to be elevated at 171/73.  Given new onset numbness with elevated BP despite medication, it is recommend that patient go directly to the ER for further evaluation/treatment/observation. Concern for possible stroke versus TIA.   Patient is stable for discharge to the emergency department. she will be going to [facility] ER via private car.  Elevated blood pressure: See notes above  Numbness: See notes above  ED Discharge Orders     None        PDMP not reviewed this encounter.     Cynda Acres, PA-C 07/10/22 1714

## 2022-07-10 NOTE — ED Triage Notes (Addendum)
Pt is having numbness in her entire body for 3 days. Pt also endorses right lower back pain and right elbow pain.

## 2022-07-10 NOTE — ED Provider Triage Note (Signed)
Emergency Medicine Provider Triage Evaluation Note  [name] , a 72 y.o. female  was evaluated in triage.  Pt complains of right upper and lower extremity paresthesias onset 2 days ago.  She denies associated headache, vision changes, nausea, vomiting, diarrhea, chest pain, shortness of breath, or any other symptoms.  She denies a history of CVA or TIA.  She states that she went to urgent care for her symptoms but was sent to the ED due to elevated blood pressure.  Review of Systems  Positive: See HPI Negative: See HPI  Physical Exam  BP (!) 158/72   Pulse 68   Temp 99.1 F (37.3 C)   Resp 18   SpO2 97%  Gen:   Awake, no distress   Resp:  Normal effort lungs clear to auscultation MSK:   Moves extremities without difficulty  Other:  Subjective diminished sensation to the right upper and right lower extremity, patient alert and oriented, cranial nerves intact, 5/5 bilateral upper and lower extremity strength, normal speech  Medical Decision Making  Medically screening exam initiated at 5:35 PM.  Appropriate orders placed.  [name] was informed that the remainder of the evaluation will be completed by another provider, this initial triage assessment does not replace that evaluation, and the importance of remaining in the ED until their evaluation is complete.     Tonette Lederer, PA-C 07/10/22 1736

## 2022-07-11 ENCOUNTER — Emergency Department (HOSPITAL_COMMUNITY): Payer: 59

## 2022-07-11 DIAGNOSIS — R29818 Other symptoms and signs involving the nervous system: Secondary | ICD-10-CM | POA: Diagnosis not present

## 2022-07-11 NOTE — ED Provider Notes (Signed)
[place] EM[facility]GENCY DE[facility]MENT AT [facility] Provider Note   CSN: [id number] Arrival date & time: 07/10/22  1708     History  Chief Complaint  Patient presents with   Numbness    [name] is a 72 y.o. female.  Patient was referred to the emergency department from urgent care after presenting there with numbness.  Patient reports that she woke up yesterday with numbness in both of her feet.  She also was having pain and "soreness" in her right shoulder area with numbness in her right hand.  Patient did not notice any weakness or clumsiness, has been able to move both her extremities without difficulty, has been ambulating.  Patient reports that symptoms have now resolved and none of the pain, numbness or tingling is currently present.       Home Medications Prior to Admission medications   Medication Sig Start Date End Date Taking? Authorizing Provider  amLODipine (NORVASC) 5 MG tablet TAKE 1 TABLET(5 MG) BY MOUTH DAILY 12/07/21   Jerre Simon, MD  aspirin EC 81 MG tablet Take 1 tablet (81 mg total) daily by mouth. 01/31/17   Loletta Specter, PA-C  atorvastatin (LIPITOR) 80 MG tablet TAKE 1 TABLET(80 MG) BY MOUTH DAILY AT 6 PM 11/15/21   Little Ishikawa, MD  Capsaicin-Menthol-Methyl Sal (CAPSAICIN-METHYL SAL-MENTHOL) 0.025-1-12 % CREA Apply 1 Tube topically daily. 05/05/22   Jerre Simon, MD  carvedilol (COREG) 12.5 MG tablet TAKE 1 TABLET(12.5 MG) BY MOUTH TWICE DAILY WITH A MEAL 05/30/22   Jerre Simon, MD  diclofenac Sodium (VOLTAREN) 1 % GEL APPLY 2 GRAMS TOPICALLY TO THE AFFECTED AREA FOUR TIMES DAILY 07/03/22   Jerre Simon, MD  ezetimibe (ZETIA) 10 MG tablet Take by mouth. 10/26/21   [provider]  fluticasone (FLONASE) 50 MCG/ACT nasal spray SHAKE LIQUID AND USE 2 SPRAYS IN EACH NOSTRIL TWICE DAILY 07/07/22   Jerre Simon, MD  furosemide (LASIX) 40 MG tablet Take 1 tablet (40 mg total) by mouth daily. 02/22/22 02/22/23  Little Ishikawa, MD  gabapentin (NEURONTIN) 300 MG capsule Take 300 mg by mouth 2 (two) times daily. 11/14/19   [provider]  ibuprofen (ADVIL) 400 MG tablet Take 1 tablet (400 mg total) by mouth every 8 (eight) hours as needed. 05/17/22   Persons, [place], PA  isosorbide mononitrate (IMDUR) 60 MG 24 hr tablet Take 1 tablet (60 mg total) by mouth daily. 03/31/22   Little Ishikawa, MD  lidocaine (HM LIDOCAINE PATCH) 4 % Place 1 patch onto the skin daily. 04/03/22   Raspet, Noberto Retort, PA-C  losartan (COZAAR) 100 MG tablet TAKE 1 TABLET(100 MG) BY MOUTH DAILY 02/15/22   Little Ishikawa, MD  meloxicam (MOBIC) 7.5 MG tablet Take 1 tablet (7.5 mg total) by mouth daily. 05/05/22   Jerre Simon, MD  Multiple Vitamin (MULITIVITAMIN WITH MINERALS) TABS Take 1 tablet by mouth daily.    [provider]  nitroGLYCERIN (NITROSTAT) 0.4 MG SL tablet PLACE 1 TABLET UNDER THE TONGUE EVERY 5 MINUTES AS NEEDED. 07/06/22   Little Ishikawa, MD  potassium chloride SA (KLOR-CON M) 20 MEQ tablet Take 1 tablet (20 mEq total) by mouth 2 (two) times daily. 09/18/21   Dione Booze, MD      Allergies    Shrimp [shellfish allergy], Latex, and Penicillins    Review of Systems   Review of Systems  Physical Exam Updated Vital Signs BP (!) 158/66 (BP Location: Right Arm)   Pulse Marland Kitchen)  59   Temp 98.2 F (36.8 C)   Resp 16   SpO2 97%  Physical Exam Vitals and nursing note reviewed.  Constitutional:      General: She is not in acute distress.    Appearance: She is well-developed.  HENT:     Head: Normocephalic and atraumatic.     Mouth/Throat:     Mouth: Mucous membranes are moist.  Eyes:     General: Vision grossly intact. Gaze aligned appropriately.     Extraocular Movements: Extraocular movements intact.     Conjunctiva/sclera: Conjunctivae normal.  Cardiovascular:     Rate and Rhythm: Normal rate and regular rhythm.     Pulses: Normal pulses.     Heart sounds: Normal heart sounds, S1  normal and S2 normal. No murmur heard.    No friction rub. No gallop.  Pulmonary:     Effort: Pulmonary effort is normal. No respiratory distress.     Breath sounds: Normal breath sounds.  Abdominal:     General: Bowel sounds are normal.     Palpations: Abdomen is soft.     Tenderness: There is no abdominal tenderness. There is no guarding or rebound.     Hernia: No hernia is present.  Musculoskeletal:        General: No swelling.     Cervical back: Full passive range of motion without pain, normal range of motion and neck supple. No spinous process tenderness or muscular tenderness. Normal range of motion.     Right lower leg: No edema.     Left lower leg: No edema.  Skin:    General: Skin is warm and dry.     Capillary Refill: Capillary refill takes less than 2 seconds.     Findings: No ecchymosis, erythema, rash or wound.  Neurological:     General: No focal deficit present.     Mental Status: She is alert and oriented to person, place, and time.     GCS: GCS eye subscore is 4. GCS verbal subscore is 5. GCS motor subscore is 6.     Cranial Nerves: Cranial nerves 2-12 are intact.     Sensory: Sensation is intact.     Motor: Motor function is intact.     Coordination: Coordination is intact.     Comments: Extraocular muscle movement: normal No visual field cut Pupils: equal and reactive both direct and consensual response is normal No nystagmus present    Sensory function is intact to light touch, pinprick Proprioception intact  Grip strength 5/5 symmetric in upper extremities No pronator drift Normal finger to nose bilaterally  Lower extremity strength 5/5 against gravity Normal heel to shin bilaterally     Psychiatric:        Attention and Perception: Attention normal.        Mood and Affect: Mood normal.        Speech: Speech normal.        Behavior: Behavior normal.     ED Results / Procedures / Treatments   Labs (all labs ordered are listed, but only  abnormal results are displayed) Labs Reviewed  CBC - Abnormal; Notable for the following components:      Result Value   WBC 13.0 (*)    All other components within normal limits  DIFFERENTIAL - Abnormal; Notable for the following components:   Neutro Abs 9.2 (*)    Abs Immature Granulocytes 0.08 (*)    All other components within normal limits  COMPREHENSIVE METABOLIC  PANEL - Abnormal; Notable for the following components:   Creatinine, Ser 1.30 (*)    GFR, Estimated 44 (*)    All other components within normal limits  ETHANOL  PROTIME-INR  APTT  RAPID URINE DRUG SCREEN, HOSP PERFORMED  URINALYSIS, ROUTINE W REFLEX MICROSCOPIC    EKG None  Radiology MR BRAIN WO CONTRAST  Result Date: 07/11/2022 CLINICAL DATA:  Acute neurologic deficit EXAM: MRI HEAD WITHOUT CONTRAST TECHNIQUE: Multiplanar, multiecho pulse sequences of the brain and surrounding structures were obtained without intravenous contrast. COMPARISON:  None Available. FINDINGS: Brain: No acute infarct, mass effect or extra-axial collection. No acute or chronic hemorrhage. Normal white matter signal, parenchymal volume and CSF spaces. The midline structures are normal. Vascular: Major flow voids are preserved. Skull and upper cervical spine: Normal calvarium and skull base. Visualized upper cervical spine and soft tissues are normal. Sinuses/Orbits:No paranasal sinus fluid levels or advanced mucosal thickening. No mastoid or middle ear effusion. Normal orbits. IMPRESSION: Normal brain MRI. Electronically Signed   By: Deatra Robinson M.D.   On: 07/11/2022 01:16   CT Head Wo Contrast  Result Date: 07/10/2022 CLINICAL DATA:  Right-sided paresthesias EXAM: CT HEAD WITHOUT CONTRAST TECHNIQUE: Contiguous axial images were obtained from the base of the skull through the vertex without intravenous contrast. RADIATION DOSE REDUCTION: This exam was performed according to the departmental dose-optimization program which includes automated  exposure control, adjustment of the mA and/or kV according to patient size and/or use of iterative reconstruction technique. COMPARISON:  None Available. FINDINGS: Brain: No evidence of an acute cortical infarct, hemorrhage, hydrocephalus, extra-axial collection or mass lesion/mass effect. Mineralization of the basal ganglia bilaterally. There is a focal hypodensity in the left hemipons, which is most likely artifactual (series 5, image 36). Vascular: No hyperdense vessel or unexpected calcification. Skull: Normal. Negative for fracture or focal lesion. Sinuses/Orbits: No middle ear or mastoid effusion. An air-fluid level in the right sphenoid sinus, which can be seen in the setting of acute sinusitis. Orbits are unremarkable. Other: None IMPRESSION: 1. Focal hypodensity in the left hemipons is most likely artifactual, however if there is clinical concern for an acute infarct that could be localized to this region, MRI could be considered. 2. Air-fluid level in the right sphenoid sinus, which can be seen in the setting of acute sinusitis. Electronically Signed   By: Lorenza Cambridge M.D.   On: 07/10/2022 19:43    Procedures Procedures    Medications Ordered in ED Medications - No data to display  ED Course/ Medical Decision Making/ A&P                             Medical Decision Making Amount and/or Complexity of Data Reviewed Labs: ordered. Decision-making details documented in ED Course. Radiology: ordered and independent interpretation performed. Decision-making details documented in ED Course. ECG/medicine tests: ordered and independent interpretation performed. Decision-making details documented in ED Course.   Differential diagnosis considered includes, but not limited to: TIA; Stroke; Electrolyte abnormality; Seizure; peripheral neuropathy  Patient presents to the emergency department for evaluation of numbness and tingling.  She has been experiencing tingling in her right hand but this  is in the setting of pain and tenderness of the right shoulder.  She does not have any functional deficit on exam, normal sensation and strength across all 3 nerves in the hand as well as proximal.  Patient also complaining of numbness and tingling in the feet, bilaterally.  There is no associated weakness, she has been ambulatory without difficulty.  At time of my evaluation, patient had spent a number of hours in the waiting room.  By the time she made it back for an exam, all symptoms had resolved.  Head CT performed at time of triage had a hypodensity which was further evaluated by MRI.  No acute stroke or other abnormality noted.  Patient back to her normal baseline, has a normal neurologic exam.  She will be appropriate for discharge and follow-up with her primary care doctor.        Final Clinical Impression(s) / ED Diagnoses Final diagnoses:  Paresthesia    Rx / DC Orders ED Discharge Orders     None         Yania Bogie, Canary Brim, MD 07/11/22 [phone]

## 2022-07-12 ENCOUNTER — Encounter: Payer: Self-pay | Admitting: Podiatry

## 2022-07-12 ENCOUNTER — Ambulatory Visit (INDEPENDENT_AMBULATORY_CARE_PROVIDER_SITE_OTHER): Payer: 59 | Admitting: Podiatry

## 2022-07-12 DIAGNOSIS — M79674 Pain in right toe(s): Secondary | ICD-10-CM

## 2022-07-12 DIAGNOSIS — M79675 Pain in left toe(s): Secondary | ICD-10-CM

## 2022-07-12 DIAGNOSIS — B351 Tinea unguium: Secondary | ICD-10-CM

## 2022-07-12 NOTE — Progress Notes (Unsigned)
  Subjective:  Patient ID: [name], female    DOB: 01-21-1951,  MRN: [id number]  Chief Complaint  Patient presents with   Numbness    PT stated that she has numbness in toes    72 y.o. female returns for the above complaint.  Patient presents with thickened elongated dystrophic mycotic toenails x 10 mild pain on palpation worse with ambulation worse with pressure would like for me to debride down she is overdue herself.  Objective:  There were no vitals filed for this visit. Podiatric Exam: Vascular: dorsalis pedis and posterior tibial pulses are palpable bilateral. Capillary return is immediate. Temperature gradient is WNL. Skin turgor WNL  Sensorium: Normal Semmes Weinstein monofilament test. Normal tactile sensation bilaterally. Nail Exam: Pt has thick disfigured discolored nails with subungual debris noted bilateral entire nail hallux through fifth toenails.  Pain on palpation to the nails. Ulcer Exam: There is no evidence of ulcer or pre-ulcerative changes or infection. Orthopedic Exam: Muscle tone and strength are WNL. No limitations in general ROM. No crepitus or effusions noted.  Skin: No Porokeratosis. No infection or ulcers    Assessment & Plan:  No diagnosis found.  Patient was evaluated and treated and all questions answered.  Onychomycosis with pain  -Nails palliatively debrided as below. -Educated on self-care  Procedure: Nail Debridement Rationale: pain  Type of Debridement: manual, sharp debridement. Instrumentation: Nail nipper, rotary burr. Number of Nails: 10  Procedures and Treatment: Consent by patient was obtained for treatment procedures. The patient understood the discussion of treatment and procedures well. All questions were answered thoroughly reviewed. Debridement of mycotic and hypertrophic toenails, 1 through 5 bilateral and clearing of subungual debris. No ulceration, no infection noted.  Return Visit-Office Procedure: Patient instructed to  return to the office for a follow up visit 3 months for continued evaluation and treatment.  Nicholes Rough, DPM    Return in about 3 months (around 10/11/2022).

## 2022-07-13 ENCOUNTER — Ambulatory Visit (INDEPENDENT_AMBULATORY_CARE_PROVIDER_SITE_OTHER): Payer: 59 | Admitting: Student

## 2022-07-13 ENCOUNTER — Telehealth: Payer: Self-pay | Admitting: *Deleted

## 2022-07-13 VITALS — BP 159/68 | HR 70 | Ht 65.0 in | Wt 195.0 lb

## 2022-07-13 DIAGNOSIS — W57XXXA Bitten or stung by nonvenomous insect and other nonvenomous arthropods, initial encounter: Secondary | ICD-10-CM | POA: Diagnosis not present

## 2022-07-13 DIAGNOSIS — N898 Other specified noninflammatory disorders of vagina: Secondary | ICD-10-CM | POA: Diagnosis not present

## 2022-07-13 MED ORDER — FLUCONAZOLE 150 MG PO TABS
150.0000 mg | ORAL_TABLET | Freq: Once | ORAL | 0 refills | Status: AC
Start: 2022-07-13 — End: 2022-07-13

## 2022-07-13 MED ORDER — CICLOPIROX 8 % EX SOLN: Freq: Every day | CUTANEOUS | 0 refills | Status: DC

## 2022-07-13 MED ORDER — MUPIROCIN 2 % EX OINT
1.0000 | TOPICAL_OINTMENT | Freq: Two times a day (BID) | CUTANEOUS | 0 refills | Status: AC
Start: 2022-07-13 — End: 2022-07-18

## 2022-07-13 MED ORDER — CEPHALEXIN 500 MG PO CAPS
500.0000 mg | ORAL_CAPSULE | Freq: Two times a day (BID) | ORAL | 0 refills | Status: AC
Start: 2022-07-13 — End: 2022-07-18

## 2022-07-13 NOTE — Progress Notes (Signed)
    SUBJECTIVE:   CHIEF COMPLAINT / HPI: Left arm bumps  1 week has had left arm bumps and a lot of pain No fevers or vomiting No trauma to this area No bites to this area that she knows of She has been squeezing this area to try and get the bumps to come to a head Has been doing Lidocaine, heating pads Tylenol every 8 hours Wakes her up due to pain No lesions any where else on the body No mucosal lesions No new medications Also states she has some vaginal itching-defers GU exam  PERTINENT  PMH / PSH:   OBJECTIVE:   BP (!) 159/68   Pulse 70   Ht  (1.651 m)   Wt 195 lb (88.5 kg)   SpO2 100%   BMI 32.45 kg/m   General: Well appearing, NAD, awake, alert, responsive to questions Respiratory: chest rises symmetrically,  no increased work of breathing Skin: two nodules with small keratotic center, painful to palpation, warm, firm, no fluctuance, some erythema surrounding; no nodules on legs or other lesions, no lesions on palms or oral mucosa    ASSESSMENT/PLAN:   Bug bite, initial encounter Appears to have 2 bug bites on her left forearm.  Some suspicion infection due to extreme pain, erythema and warmth.  Reassuringly patient has no systemic symptoms.  Will treat for underlying infection in the meantime.  Patient has an allergy to penicillin that is lower and has received Ancef in the past and tolerated that well. - mupirocin ointment (BACTROBAN) 2 %; Apply 1 Application topically 2 (two) times daily for 5 days.  Dispense: 10 g; Refill: 0 - cephALEXin (KEFLEX) 500 MG capsule; Take 1 capsule (500 mg total) by mouth 2 (two) times daily for 5 days.  Dispense: 10 capsule; Refill: 0 -ED/Return precautions -Warm compresses  Vaginal itching Discussed with patient to take after antibiotic course. If not improved will need to come in for GU exam/wet prep. - fluconazole (DIFLUCAN) 150 MG tablet; Take 1 tablet (150 mg total) by mouth once for 1 dose.  Dispense: 1 tablet; Refill:  0   Levin Erp, MD [facility]

## 2022-07-13 NOTE — Telephone Encounter (Signed)
Patient is calling for status of an antifungal medication that was supposed to be sent to pharmacy, not there, please advise.

## 2022-07-13 NOTE — Patient Instructions (Addendum)
It was great to see you! Thank you for allowing me to participate in your care!   Our plans for today:  -I am sending in antibiotics to take for this skin irritation-do not pick at it-I would put warm compresses on it-continue tylenol -Diflucan for vaginal itching-would take it after you finish the antibiotics -return to care if having fevers, vomiting, worsening redness/size  Take care and seek immediate care sooner if you develop any concerns.  Levin Erp, MD

## 2022-07-13 NOTE — Telephone Encounter (Signed)
Spoke with patient and she will pick up prescription at pharmacy.

## 2022-07-17 ENCOUNTER — Telehealth: Payer: Self-pay

## 2022-07-17 NOTE — Telephone Encounter (Signed)
     Patient  visit on 4/16  at [facility]    Have you been able to follow up with your primary care physician? No  The patient was or was not able to obtain any needed medicine or equipment. Yes   Are there diet recommendations that you are having difficulty following? Na   Patient expresses understanding of discharge instructions and education provided has no other needs at this time.  Yes     Lenard Forth [facility] Guide, [facility] [phone] [address] [place], [place], [place] [address] Phone: [phone] Email: [email or web address]

## 2022-08-03 ENCOUNTER — Other Ambulatory Visit: Payer: Self-pay

## 2022-08-03 ENCOUNTER — Other Ambulatory Visit: Payer: Self-pay | Admitting: Student

## 2022-08-03 DIAGNOSIS — M1712 Unilateral primary osteoarthritis, left knee: Secondary | ICD-10-CM

## 2022-08-03 MED ORDER — GABAPENTIN 300 MG PO CAPS: 300.0000 mg | ORAL_CAPSULE | Freq: Two times a day (BID) | ORAL | 2 refills | Status: DC

## 2022-08-07 ENCOUNTER — Encounter (HOSPITAL_COMMUNITY): Payer: Self-pay

## 2022-08-07 ENCOUNTER — Ambulatory Visit (HOSPITAL_COMMUNITY)
Admission: EM | Admit: 2022-08-07 | Discharge: 2022-08-07 | Disposition: A | Discharge status: Home / Self Care | Payer: 59 | Attending: Physician Assistant | Admitting: Physician Assistant

## 2022-08-07 DIAGNOSIS — R600 Localized edema: Secondary | ICD-10-CM | POA: Diagnosis not present

## 2022-08-07 DIAGNOSIS — Z79899 Other long term (current) drug therapy: Secondary | ICD-10-CM | POA: Diagnosis not present

## 2022-08-07 DIAGNOSIS — R200 Anesthesia of skin: Secondary | ICD-10-CM | POA: Insufficient documentation

## 2022-08-07 DIAGNOSIS — M7989 Other specified soft tissue disorders: Secondary | ICD-10-CM

## 2022-08-07 DIAGNOSIS — R319 Hematuria, unspecified: Secondary | ICD-10-CM

## 2022-08-07 DIAGNOSIS — I10 Essential (primary) hypertension: Secondary | ICD-10-CM | POA: Diagnosis not present

## 2022-08-07 LAB — CBC WITH DIFFERENTIAL/PLATELET
Abs Immature Granulocytes: 0.05 10*3/uL (ref 0.00–0.07)
Basophils Absolute: 0.1 10*3/uL (ref 0.0–0.1)
Basophils Relative: 1 %
Eosinophils Absolute: 0.3 10*3/uL (ref 0.0–0.5)
Eosinophils Relative: 3 %
HCT: 36.7 % (ref 36.0–46.0)
Hemoglobin: 11.4 g/dL — ABNORMAL LOW (ref 12.0–15.0)
Immature Granulocytes: 0 %
Lymphocytes Relative: 22 %
Lymphs Abs: 2.6 10*3/uL (ref 0.7–4.0)
MCH: 30.2 pg (ref 26.0–34.0)
MCHC: 31.1 g/dL (ref 30.0–36.0)
MCV: 97.1 fL (ref 80.0–100.0)
Monocytes Absolute: 0.8 10*3/uL (ref 0.1–1.0)
Monocytes Relative: 6 %
Neutro Abs: 8.2 10*3/uL — ABNORMAL HIGH (ref 1.7–7.7)
Neutrophils Relative %: 68 %
Platelets: 327 10*3/uL (ref 150–400)
RBC: 3.78 MIL/uL — ABNORMAL LOW (ref 3.87–5.11)
RDW: 14.8 % (ref 11.5–15.5)
WBC: 12 10*3/uL — ABNORMAL HIGH (ref 4.0–10.5)
nRBC: 0 % (ref 0.0–0.2)

## 2022-08-07 LAB — POCT URINALYSIS DIP (MANUAL ENTRY)
Bilirubin, UA: NEGATIVE
Glucose, UA: NEGATIVE mg/dL
Ketones, POC UA: NEGATIVE mg/dL
Leukocytes, UA: NEGATIVE
Nitrite, UA: NEGATIVE
Protein Ur, POC: NEGATIVE mg/dL
Spec Grav, UA: 1.02 (ref 1.010–1.025)
Urobilinogen, UA: 0.2 E.U./dL
pH, UA: 6 (ref 5.0–8.0)

## 2022-08-07 LAB — COMPREHENSIVE METABOLIC PANEL
ALT: 15 U/L (ref 0–44)
AST: 17 U/L (ref 15–41)
Albumin: 3.5 g/dL (ref 3.5–5.0)
Alkaline Phosphatase: 95 U/L (ref 38–126)
Anion gap: 8 (ref 5–15)
BUN: 15 mg/dL (ref 8–23)
CO2: 27 mmol/L (ref 22–32)
Calcium: 8.8 mg/dL — ABNORMAL LOW (ref 8.9–10.3)
Chloride: 106 mmol/L (ref 98–111)
Creatinine, Ser: 0.96 mg/dL (ref 0.44–1.00)
GFR, Estimated: >60 mL/min (ref >60)
Glucose, Bld: 101 mg/dL — ABNORMAL HIGH (ref 70–99)
Potassium: 3.5 mmol/L (ref 3.5–5.1)
Sodium: 141 mmol/L (ref 135–145)
Total Bilirubin: 0.4 mg/dL (ref 0.3–1.2)
Total Protein: 7.3 g/dL (ref 6.5–8.1)

## 2022-08-07 LAB — TSH: TSH: 1.369 u[IU]/mL (ref 0.350–4.500)

## 2022-08-07 MED ORDER — CARVEDILOL 12.5 MG PO TABS: ORAL_TABLET | ORAL | 2 refills | Status: DC

## 2022-08-07 MED ORDER — LOSARTAN POTASSIUM 100 MG PO TABS: ORAL_TABLET | ORAL | 3 refills | Status: DC

## 2022-08-07 NOTE — ED Triage Notes (Signed)
Pt report numbness in her entire body x 2 days. Pt reports swelling in her feet x 2 days.

## 2022-08-07 NOTE — Discharge Instructions (Addendum)
Keep your legs elevated and use compression stockings.  Avoid salt in your diet.  I will contact you if any of your lab work is abnormal.  As we discussed, your amlodipine could be contributing to the leg swelling so if this does not resolve with our treatment plan I would recommend following up with your primary care to to consider medication adjustment.  If you develop any worsening symptoms including increasing swelling, sudden weight gain of more than 2 pounds in 24 hours or 5 pounds in a week, shortness of breath, chest discomfort you need to be seen immediately.  There was a little bit of blood in your urine.  Please follow-up with your primary care within 2 to 4 weeks to have this rechecked to make sure it goes away.

## 2022-08-07 NOTE — ED Provider Notes (Signed)
[facility]    CSN: [id number] Arrival date & time: 08/07/22  1711      History   Chief Complaint Chief Complaint  Patient presents with   Joint Swelling    HPI [name] is a 72 y.o. female.   Patient presents today with 2-day history of swelling in her feet.  She reports that she has had an increase in numbness in her feet since the swelling began but denies any significant pain.  She denies history of heart failure, chronic liver/kidney disease, thyroid condition.  Denies any increase in sodium consumption.  She denies any associated shortness of breath or wheezing.  She does not weigh herself daily.  She has not tried any medications for symptom management.  Her last echocardiogram was 12/10/2020 which showed EF of 65 to 70%.  She does take amlodipine but has been stable on this medication for many years without significant swelling.  She does report she has had some numbness in other parts of her body but this is chronic and she has been evaluated in the emergency room for similar symptoms at which point she had a negative workup including MRI and CT head.  She reports that the symptoms are stable and have not changed from baseline.  She denies any recent immobilization, hospitalization, surgical procedure, exogenous hormone use, active malignancy.  Denies any associate palpitations or chest pain.    Past Medical History:  Diagnosis Date   Abnormal EKG 2012   hospitalized for T wave inversion in lateral leads with MSK chest pains, normal ECHO, negative trops, no cardiology consult. recent EKG 7/15 stable T wave inversions.    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   Arthritis 2000   Arthritis 2011   Bilateral leg pain 06/14/2015   Clotting disorder (HCC)    blood clot kidney 2017   Heart murmur    Hyperlipidemia 2011   Hypertension 2011   Left elbow pain 03/31/2020   Meniscus tear 2000   L KNEE   Pelvic pain 10/07/2018   Renal infarct (HCC) 2017   blood clot  in the kidney    Patient Active Problem List   Diagnosis Date Noted   Viral URI 04/20/2022   Scoliosis of lumbar spine 02/28/2022   Candida glabrata infection 02/28/2022   Daytime somnolence 01/12/2021   Atypical chest pain 12/13/2020   Abnormal cardiac CT angiography 12/13/2020   Contact dermatitis 01/14/2019   Vaginal itching 12/05/2018   Leg edema 08/28/2018   Thickened endometrium    Umbilical hernia without obstruction and without gangrene 01/01/2018   Osteopenia 09/05/2017   Closed nondisplaced spiral fracture of shaft of right tibia 03/28/2017   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/30/2015   HLD (hyperlipidemia)    Aortic atherosclerosis (HCC)    Back pain 07/08/2015   Scotoma of blind spot area in visual field 06/14/2015   Seasonal allergies 03/09/2015   Hypopigmentation 03/09/2015   Unilateral primary osteoarthritis, left knee 07/08/2014   DJD (degenerative joint disease), lumbar 07/08/2014   Osteoarthritis of both knees 07/08/2014   Urinary frequency 06/11/2014   Allergic rhinoconjunctivitis of both eyes 06/11/2014   Nearsightedness 04/27/2014   Abnormal EKG    Hypertension 12/11/2013    Past Surgical History:  Procedure Laterality Date   ABDOMINAL SURGERY  2010   for bowel blockage   COLON SURGERY  2010   bowel blockage   COLONOSCOPY  09/2014   HYSTEROSCOPY WITH D & C N/A 05/08/2018   Procedure:  DILATATION AND CURETTAGE /HYSTEROSCOPY;  Surgeon: Catalina Antigua, MD;  Location: [facility];  Service: Gynecology;  Laterality: N/A;   KNEE ARTHROSCOPY Left 09/25/2013   Procedure: LEFT KNEE ARTHROSCOPY WITH PARTIAL MEDIAL AND LATERAL  MENISCECTOMY/DEBRIDEMENT/MICRO FRACTURE MEDIAL FEMORAL CONDYLE, REMOVAL OF OSTEOCONDRAL FRAGMENT;  Surgeon: Javier Docker, MD;  Location: [facility];  Service: Orthopedics;  Laterality: Left;   KNEE SURGERY Right 1999   LEFT HEART CATH AND CORONARY ANGIOGRAPHY N/A 12/13/2020   Procedure: LEFT HEART  CATH AND CORONARY ANGIOGRAPHY;  Surgeon: Yvonne Kendall, MD;  Location: [facility];  Service: Cardiovascular;  Laterality: N/A;   POLYPECTOMY     TEE WITHOUT CARDIOVERSION N/A 10/04/2015   Procedure: TRANSESOPHAGEAL ECHOCARDIOGRAM (TEE);  Surgeon: Quintella Reichert, MD;  Location: [facility];  Service: Cardiovascular;  Laterality: N/A;   TUBAL LIGATION  1978    OB History     Gravida  2   Para  2   Term      Preterm      AB      Living  2      SAB      IAB      Ectopic      Multiple      Live Births               Home Medications    Prior to Admission medications   Medication Sig Start Date End Date Taking? Authorizing Provider  amLODipine (NORVASC) 5 MG tablet TAKE 1 TABLET(5 MG) BY MOUTH DAILY 12/07/21  Yes Jerre Simon, MD  aspirin EC 81 MG tablet Take 1 tablet (81 mg total) daily by mouth. 01/31/17  Yes Loletta Specter, PA-C  atorvastatin (LIPITOR) 80 MG tablet TAKE 1 TABLET(80 MG) BY MOUTH DAILY AT 6 PM 11/15/21  Yes Little Ishikawa, MD  Capsaicin-Menthol-Methyl Sal (CAPSAICIN-METHYL SAL-MENTHOL) 0.025-1-12 % CREA Apply 1 Tube topically daily. 05/05/22  Yes Jerre Simon, MD  carvedilol (COREG) 12.5 MG tablet TAKE 1 TABLET(12.5 MG) BY MOUTH TWICE DAILY WITH A MEAL 08/07/22  Yes Jerre Simon, MD  diclofenac Sodium (VOLTAREN) 1 % GEL APPLY 2 GRAMS TOPICALLY TO THE AFFECTED AREA FOUR TIMES DAILY 08/03/22  Yes Jerre Simon, MD  ezetimibe (ZETIA) 10 MG tablet Take by mouth. 10/26/21  Yes [provider]  fluticasone (FLONASE) 50 MCG/ACT nasal spray SHAKE LIQUID AND USE 2 SPRAYS IN EACH NOSTRIL TWICE DAILY 07/07/22  Yes Jerre Simon, MD  furosemide (LASIX) 40 MG tablet Take 1 tablet (40 mg total) by mouth daily. 02/22/22 02/22/23 Yes Little Ishikawa, MD  gabapentin (NEURONTIN) 300 MG capsule Take 1 capsule (300 mg total) by mouth 2 (two) times daily. 08/03/22 11/01/22 Yes Jerre Simon, MD  isosorbide mononitrate (IMDUR) 60 MG 24 hr tablet  Take 1 tablet (60 mg total) by mouth daily. 03/31/22  Yes Little Ishikawa, MD  losartan (COZAAR) 100 MG tablet TAKE 1 TABLET(100 MG) BY MOUTH DAILY 08/07/22  Yes Jerre Simon, MD  meloxicam (MOBIC) 7.5 MG tablet Take 1 tablet (7.5 mg total) by mouth daily. 05/05/22  Yes Jerre Simon, MD  potassium chloride SA (KLOR-CON M) 20 MEQ tablet Take 1 tablet (20 mEq total) by mouth 2 (two) times daily. 09/18/21  Yes Dione Booze, MD  ciclopirox [facility]) 8 % solution Apply topically at bedtime. Apply over nail and surrounding skin. Apply daily over previous coat. After seven (7) days, may remove with alcohol and continue cycle. 07/13/22   Candelaria Stagers, DPM  ibuprofen (ADVIL) 400 MG tablet Take 1 tablet (400 mg total) by mouth every 8 (eight) hours as needed. 05/17/22   Persons, [place], PA  lidocaine (HM LIDOCAINE PATCH) 4 % Place 1 patch onto the skin daily. 04/03/22   Psalm Arman, Noberto Retort, PA-C  Multiple Vitamin (MULITIVITAMIN WITH MINERALS) TABS Take 1 tablet by mouth daily.    [provider]  nitroGLYCERIN (NITROSTAT) 0.4 MG SL tablet PLACE 1 TABLET UNDER THE TONGUE EVERY 5 MINUTES AS NEEDED. 07/06/22   Little Ishikawa, MD    Family History Family History  Problem Relation Age of Onset   Hypertension Mother    Heart disease Mother        has a pacemaker    Alzheimer's disease Mother    Hyperlipidemia Mother    Osteoporosis Mother    Heart failure Father    Hypertension Father    Alzheimer's disease Father    Hypertension Sister    Breast cancer Sister    Hypertension Sister    Gout Sister    Heart disease Son    Alcohol abuse Son    Pancreatitis Son    Hypertension Son    Cancer Neg Hx    Colon cancer Neg Hx    Esophageal cancer Neg Hx    Stomach cancer Neg Hx    Rectal cancer Neg Hx     Social History Social History   Tobacco Use   Smoking status: Former    Types: Cigarettes    Quit date: 07/29/2010    Years since quitting: 12.0    Passive exposure: Past    Smokeless tobacco: Never  Vaping Use   Vaping Use: Never used  Substance Use Topics   Alcohol use: Not Currently   Drug use: Never     Allergies   Shrimp [shellfish allergy], Latex, and Penicillins   Review of Systems Review of Systems  Constitutional:  Positive for activity change. Negative for appetite change, fatigue, fever and unexpected weight change.  Respiratory:  Negative for cough and shortness of breath.   Cardiovascular:  Positive for leg swelling. Negative for chest pain and palpitations.  Gastrointestinal:  Negative for abdominal pain, diarrhea, nausea and vomiting.  Neurological:  Positive for numbness (chronic). Negative for dizziness, weakness, light-headedness and headaches.     Physical Exam Triage Vital Signs ED Triage Vitals [08/07/22 1815]  Enc Vitals Group     BP (!) 159/65     Pulse Rate 84     Resp 18     Temp 98.5 F (36.9 C)     Temp Source Oral     SpO2 98 %     Weight      Height      Head Circumference      Peak Flow      Pain Score      Pain Loc      Pain Edu?      Excl. in GC?    No data found.  Updated Vital Signs BP (!) 159/65 (BP Location: Left Arm)   Pulse 84   Temp 98.5 F (36.9 C) (Oral)   Resp 18   SpO2 98%   Visual Acuity Right Eye Distance:   Left Eye Distance:   Bilateral Distance:    Right Eye Near:   Left Eye Near:    Bilateral Near:     Physical Exam Vitals reviewed.  Constitutional:      General: She is awake. She is not in acute  distress.    Appearance: Normal appearance. She is well-developed. She is not ill-appearing.     Comments: Very pleasant female appears stated age in no acute distress sitting comfortably in exam room  HENT:     Head: Normocephalic and atraumatic.  Cardiovascular:     Rate and Rhythm: Normal rate and regular rhythm.     Heart sounds: Normal heart sounds, S1 normal and S2 normal. No murmur heard.    Comments: 1+ pitting edema to mid anterior tibia bilaterally Pulmonary:      Effort: Pulmonary effort is normal.     Breath sounds: Normal breath sounds. No wheezing, rhonchi or rales.     Comments: Clear auscultation bilaterally Abdominal:     Palpations: Abdomen is soft.     Tenderness: There is no abdominal tenderness.  Musculoskeletal:     Right lower leg: 1+ Edema present.     Left lower leg: 1+ Edema present.  Psychiatric:        Behavior: Behavior is cooperative.      UC Treatments / Results  Labs (all labs ordered are listed, but only abnormal results are displayed) Labs Reviewed  POCT URINALYSIS DIP (MANUAL ENTRY) - Abnormal; Notable for the following components:      Result Value   Blood, UA trace-intact (*)    All other components within normal limits  CBC WITH DIFFERENTIAL/PLATELET  COMPREHENSIVE METABOLIC PANEL  TSH    EKG   Radiology No results found.  Procedures Procedures (including critical care time)  Medications Ordered in UC Medications - No data to display  Initial Impression / Assessment and Plan / UC Course  I have reviewed the triage vital signs and the nursing notes.  Pertinent labs & imaging results that were available during my care of the patient were reviewed by me and considered in my medical decision making (see chart for details).     Patient is well-appearing, afebrile, nontoxic, nontachycardic.  No rales on exam and oxygen saturation is 98% so x-ray was deferred.  Suspect dependent edema as etiology of symptoms but we did discuss that amlodipine could be contributing to leg swelling.  If she has persistent symptoms despite conservative treatment measures recommend she follow-up with her primary care to consider medication adjustment.  Recommended that she keep legs elevated and avoid salt.  Also recommended compression stockings.  She is to weigh yourself daily and if she gains more than 2 pounds in 24 hours or 5 pounds within a week she needs to be reevaluated.  UA showed trace blood which is new compared to 5  months ago.  There is no evidence of proteinuria.  Recommend that she follow-up with PCP in 2 to 4 weeks to have UA rechecked and if this is persistent would need to see urology.   Basic labs including CBC, CMP, TSH obtained today and are pending.  We will contact her if these are abnormal.  Discussed that if she has any worsening symptoms including sudden weight gain, shortness of breath, chest discomfort she should be seen immediately.  Strict return precautions given.  As patient was being discharged she did request pain medication.  Discussed that I would like to hold off on starting ibuprofen until we ensure that her kidney function is normal.  She can use Tylenol over-the-counter.  We will send in ibuprofen if her lab work is reassuring.  Final Clinical Impressions(s) / UC Diagnoses   Final diagnoses:  Leg swelling  Hematuria, unspecified type  Discharge Instructions      Keep your legs elevated and use compression stockings.  Avoid salt in your diet.  I will contact you if any of your lab work is abnormal.  As we discussed, your amlodipine could be contributing to the leg swelling so if this does not resolve with our treatment plan I would recommend following up with your primary care to to consider medication adjustment.  If you develop any worsening symptoms including increasing swelling, sudden weight gain of more than 2 pounds in 24 hours or 5 pounds in a week, shortness of breath, chest discomfort you need to be seen immediately.  There was a little bit of blood in your urine.  Please follow-up with your primary care within 2 to 4 weeks to have this rechecked to make sure it goes away.     ED Prescriptions   None    PDMP not reviewed this encounter.   Jeani Hawking, PA-C 08/07/22 2014

## 2022-08-08 ENCOUNTER — Telehealth (HOSPITAL_COMMUNITY): Payer: Self-pay | Admitting: Emergency Medicine

## 2022-08-08 MED ORDER — IBUPROFEN 600 MG PO TABS: 600.0000 mg | ORAL_TABLET | Freq: Three times a day (TID) | ORAL | 0 refills | Status: DC | PRN

## 2022-08-09 ENCOUNTER — Other Ambulatory Visit: Payer: Self-pay

## 2022-08-09 ENCOUNTER — Ambulatory Visit
Admission: RE | Admit: 2022-08-09 | Discharge: 2022-08-09 | Disposition: A | Discharge status: Home / Self Care | Payer: 59 | Source: Ambulatory Visit | Attending: Family Medicine | Admitting: Family Medicine

## 2022-08-09 ENCOUNTER — Ambulatory Visit (INDEPENDENT_AMBULATORY_CARE_PROVIDER_SITE_OTHER): Payer: 59 | Admitting: Student

## 2022-08-09 VITALS — BP 140/69 | HR 64 | Ht 65.0 in | Wt 192.6 lb

## 2022-08-09 DIAGNOSIS — D72829 Elevated white blood cell count, unspecified: Secondary | ICD-10-CM

## 2022-08-09 DIAGNOSIS — R051 Acute cough: Secondary | ICD-10-CM

## 2022-08-09 DIAGNOSIS — R053 Chronic cough: Secondary | ICD-10-CM | POA: Diagnosis not present

## 2022-08-09 NOTE — Progress Notes (Signed)
  SUBJECTIVE:   CHIEF COMPLAINT / HPI:   WBC count Went to urgent care Monday, they said WBC was high. She came today to figure out why. Wanting to figure out why it's high. Has not been feeling sick, but does have a non-productive cough for last few days. Using the bathroom like normal, normal stools and urination, eating and drinking like normal. Notes stomach has been a little woozy for last 3-4 days. No abdominal pain or issues w/ meals. No fevers, malaise, or systemic symptoms.  Vaginal Itching Still having vaginal itching. And tingling in hands and feet. Been an issue for a couple months.   Tingling in hands and feet Patient noting tingling in hands and feet   PERTINENT  PMH / PSH:    Patient Care Team: Jerre Simon, MD as PCP - General (Family Medicine) Little Ishikawa, MD as PCP - Cardiology (Cardiology) Lennette Bihari, MD as PCP - Sleep Medicine (Cardiology) Jene Every, MD as Consulting Physician (Orthopedic Surgery) OBJECTIVE:  BP (!) 140/69   Pulse 64   Ht 5\' 5"  (1.651 m)   Wt 192 lb 9.6 oz (87.4 kg)   SpO2 100%   BMI 32.05 kg/m  Physical Exam Constitutional:      Appearance: Normal appearance.  Cardiovascular:     Rate and Rhythm: Normal rate and regular rhythm.     Pulses: Normal pulses.     Heart sounds: Normal heart sounds. No murmur heard.    No friction rub. No gallop.  Pulmonary:     Effort: Pulmonary effort is normal. No respiratory distress.     Breath sounds: Normal breath sounds. No stridor. No wheezing, rhonchi or rales.  Abdominal:     General: There is no distension.     Palpations: Abdomen is soft.     Tenderness: There is no abdominal tenderness.  Neurological:     Mental Status: She is alert.  Psychiatric:        Mood and Affect: Mood normal.        Behavior: Behavior normal.      ASSESSMENT/PLAN:  Leukocytosis, unspecified type Assessment & Plan: Patient comes in following up lab abnormality noticed at urgent care.   Patient was noted to have mild leukocytosis with white blood cell count of 12 on the 13th, despite not having any signs of infection.  Patient notes feeling well today but has had a nonproductive cough and some stomach queasiness.  Notes normal bathroom habits, no fevers, or malaise, tolerating p.o. per normal.  Low concern for acute infection at this time given lack of symptoms but will want to work this up as patient had mild leukocytosis last month as well, with WBC of 13.  Will have patient return for follow-up next week to recheck CBC and obtain blood smear to investigate causes of leukocytosis. - Follow-up next week - CBC at next visit - Blood smear next visit - Consider heme-onc referral if no clear etiology for leukocytosis   Acute cough Assessment & Plan: Patient notes recent nonproductive cough for last few days, no fevers or malaise.  Low concern for infection but patient does have mild leukocytosis with WBC of 12, previously 13 last month.  Will obtain chest x-ray to evaluate for risk of atypical pneumonia. - CXR  Orders: -     DG Chest 2 View; Future   No follow-ups on file. Bess Kinds, MD 08/10/2022, 7:54 AM PGY-2, [facility]

## 2022-08-09 NOTE — Patient Instructions (Addendum)
It was great to see you! Thank you for allowing me to participate in your care!  We are not sure why your white blood cell count is elevated. We are going to repeat labs next week to see what could be going on.   Our plans for today:  - Chest X-ray  Go to: [facility]   [address]   Phone: [phone]    - Blood labs  Will collect next week, schedule appointment.  -Will repeat White Blood Cell count and get Blood Smear at next appointment  - Blood Pressure I want you to make an appointment with Dr. Madelon Lips to discuss your blood pressure. You are on three medications and it's still high. He specializes in this type of care.  -Make Appointment with:   Dr. Madelon Lips for Blood Pressure management   Take care and seek immediate care sooner if you develop any concerns.   Dr. Bess Kinds, MD [facility]

## 2022-08-10 DIAGNOSIS — D72829 Elevated white blood cell count, unspecified: Secondary | ICD-10-CM | POA: Insufficient documentation

## 2022-08-10 DIAGNOSIS — R051 Acute cough: Secondary | ICD-10-CM | POA: Insufficient documentation

## 2022-08-10 HISTORY — DX: Elevated white blood cell count, unspecified: D72.829

## 2022-08-10 HISTORY — DX: Acute cough: R05.1

## 2022-08-10 NOTE — Assessment & Plan Note (Signed)
Patient notes recent nonproductive cough for last few days, no fevers or malaise.  Low concern for infection but patient does have mild leukocytosis with WBC of 12, previously 13 last month.  Will obtain chest x-ray to evaluate for risk of atypical pneumonia. - CXR

## 2022-08-10 NOTE — Assessment & Plan Note (Addendum)
Patient comes in following up lab abnormality noticed at urgent care.  Patient was noted to have mild leukocytosis with white blood cell count of 12 on the 13th, despite not having any signs of infection.  Patient notes feeling well today but has had a nonproductive cough and some stomach queasiness.  Notes normal bathroom habits, no fevers, or malaise, tolerating p.o. per normal.  Low concern for acute infection at this time given lack of symptoms but will want to work this up as patient had mild leukocytosis last month as well, with WBC of 13.  Will have patient return for follow-up next week to recheck CBC and obtain blood smear to investigate causes of leukocytosis. - Follow-up next week - CBC at next visit - Blood smear next visit - Consider heme-onc referral if no clear etiology for leukocytosis

## 2022-08-15 ENCOUNTER — Encounter: Payer: Self-pay | Admitting: Family Medicine

## 2022-08-15 ENCOUNTER — Ambulatory Visit (INDEPENDENT_AMBULATORY_CARE_PROVIDER_SITE_OTHER): Payer: 59 | Admitting: Family Medicine

## 2022-08-15 VITALS — BP 157/60 | HR 67 | Ht 65.0 in | Wt 197.8 lb

## 2022-08-15 DIAGNOSIS — I10 Essential (primary) hypertension: Secondary | ICD-10-CM | POA: Diagnosis not present

## 2022-08-15 DIAGNOSIS — R109 Unspecified abdominal pain: Secondary | ICD-10-CM | POA: Diagnosis not present

## 2022-08-15 DIAGNOSIS — D72829 Elevated white blood cell count, unspecified: Secondary | ICD-10-CM

## 2022-08-15 LAB — POCT URINALYSIS DIP (MANUAL ENTRY)
Bilirubin, UA: NEGATIVE
Blood, UA: NEGATIVE
Glucose, UA: NEGATIVE mg/dL
Ketones, POC UA: NEGATIVE mg/dL
Leukocytes, UA: NEGATIVE
Nitrite, UA: NEGATIVE
Protein Ur, POC: NEGATIVE mg/dL
Spec Grav, UA: 1.015 (ref 1.010–1.025)
Urobilinogen, UA: 0.2 E.U./dL
pH, UA: 5.5 (ref 5.0–8.0)

## 2022-08-15 MED ORDER — HYDROCORTISONE 2.5 % EX OINT: TOPICAL_OINTMENT | Freq: Two times a day (BID) | CUTANEOUS | 0 refills | Status: DC

## 2022-08-15 NOTE — Patient Instructions (Addendum)
It was great to see you!  Things we discussed at today's visit: -We are checking some labs and urine studies today.  I will send you a MyChart message with the results. -I have ordered a CT scan of your abdomen due to your left-sided pain.  Someone will call you to schedule this  Stop at the front and schedule a follow-up visit in 2-3 weeks. We want to recheck some labs when you're feeling well. We should also follow-up on your blood pressure because it was high today.  Take care and seek immediate care sooner if you develop any concerns.  Dr. [facility]

## 2022-08-15 NOTE — Progress Notes (Unsigned)
    SUBJECTIVE:   CHIEF COMPLAINT / HPI:   Leukocytosis Follow-Up -WBC slightly high (12.0) on 5/13 at Urgent Care -was advised to follow-up for recheck  -now having some infectious symptoms: diarrhea started ~4 days ago, having 2-3 episodes per day, no blood -nausea as well but no vomiting -slight cough -no fever -no rhinorrhea, sore throat, chest pain, or dyspnea -no known sick contacts  Left flank pain Patient reports this comes and goes over a few years.  Has always been told it is arthritis.  She wants to make sure it is not related to her kidneys.  Reports the pain is worse when walking or standing.  Does not radiate into her legs.  No numbness or weakness.  No urinary symptoms. Nausea and diarrhea as above. Did have blood on her urinalysis at urgent care last week- micro not performed.  PERTINENT  PMH / PSH: h/o left renal infarct, HTN, HLD, h/o aortic valve vegetation  OBJECTIVE:   BP (!) 157/60   Pulse 67   Ht 5\' 5"  (1.651 m)   Wt 197 lb 12.8 oz (89.7 kg)   SpO2 100%   BMI 32.92 kg/m   Gen: NAD, pleasant, able to participate in exam CV: RRR, normal S1/S2, no murmur Resp: Normal effort, lungs CTAB GI: abdomen soft, nontender, nondistended Extremities: no edema or cyanosis Skin: warm and dry, no rashes noted Neuro: alert, no obvious focal deficits Psych: Normal affect and mood Back: no midline tenderness, there is tenderness in left flank region/lateral lumbar paraspinal musculature, patient favoring right side when sitting, FROM although extension elicits pain, 5/5 strength bilateral lower extremities, sensation intact to light touch throughout   ASSESSMENT/PLAN:   Leukocytosis WBC 12.0 recently.  Will recheck today to ensure no drastic change, but if mildly elevated, results will be difficult to interpret in the setting of current infectious symptoms.  Return when feeling well for repeat CBC with differential and peripheral smear.  Flank pain Left-sided flank  pain intermittent for several years, currently flaring over past few days. While it may be related to her known DJD, she does have a history of left-sided renal infarct as well as nephrolithiasis.  In the presence of her nausea, diarrhea, leukocytosis, blood on recent urinalysis, and her age-- will obtain CT abdomen/pelvis for further workup.  Hypertension BP noted to be elevated today. She reports medication adherence. Continue current meds and follow-up in 2 weeks.     Maury Dus, MD [facility]

## 2022-08-16 LAB — CBC WITH DIFFERENTIAL/PLATELET
Basophils Absolute: 0.1 10*3/uL (ref 0.0–0.2)
Basos: 1 %
EOS (ABSOLUTE): 0.3 10*3/uL (ref 0.0–0.4)
Eos: 2 %
Hematocrit: 35.5 % (ref 34.0–46.6)
Hemoglobin: 11.9 g/dL (ref 11.1–15.9)
Immature Grans (Abs): 0 10*3/uL (ref 0.0–0.1)
Immature Granulocytes: 0 %
Lymphocytes Absolute: 2.6 10*3/uL (ref 0.7–3.1)
Lymphs: 21 %
MCH: 31.6 pg (ref 26.6–33.0)
MCHC: 33.5 g/dL (ref 31.5–35.7)
MCV: 94 fL (ref 79–97)
Monocytes Absolute: 0.8 10*3/uL (ref 0.1–0.9)
Monocytes: 7 %
Neutrophils Absolute: 8.6 10*3/uL — ABNORMAL HIGH (ref 1.4–7.0)
Neutrophils: 69 %
Platelets: 337 10*3/uL (ref 150–450)
RBC: 3.76 x10E6/uL — ABNORMAL LOW (ref 3.77–5.28)
RDW: 13.9 % (ref 11.7–15.4)
WBC: 12.5 10*3/uL — ABNORMAL HIGH (ref 3.4–10.8)

## 2022-08-16 NOTE — Assessment & Plan Note (Signed)
Left-sided flank pain intermittent for several years.  While it may be related to her known DJD, she does have a history of left-sided renal infarct as well as nephrolithiasis.  In the presence of her nausea, diarrhea *

## 2022-08-16 NOTE — Assessment & Plan Note (Addendum)
WBC 12.0 recently.  Will recheck today to ensure no drastic change, but if mildly elevated, results will be difficult to interpret in the setting of current infectious symptoms.  Return when feeling well for repeat CBC with differential and peripheral smear.

## 2022-08-17 ENCOUNTER — Telehealth: Payer: Self-pay

## 2022-08-17 NOTE — Assessment & Plan Note (Signed)
BP noted to be elevated today. She reports medication adherence. Continue current meds and follow-up in 2 weeks.

## 2022-08-17 NOTE — Telephone Encounter (Signed)
Called patient and informed her of upcoming CT scan.  Patient has appointment at [facility] 08/25/2022 and will receive additional information then.  Glennie Hawk, CMA

## 2022-08-22 ENCOUNTER — Encounter: Payer: Self-pay | Admitting: Student

## 2022-08-25 ENCOUNTER — Encounter: Payer: Self-pay | Admitting: Family Medicine

## 2022-08-25 ENCOUNTER — Ambulatory Visit (INDEPENDENT_AMBULATORY_CARE_PROVIDER_SITE_OTHER): Payer: 59 | Admitting: Family Medicine

## 2022-08-25 VITALS — BP 147/62 | HR 64 | Ht 64.0 in | Wt 198.4 lb

## 2022-08-25 DIAGNOSIS — D72829 Elevated white blood cell count, unspecified: Secondary | ICD-10-CM | POA: Diagnosis not present

## 2022-08-25 DIAGNOSIS — I10 Essential (primary) hypertension: Secondary | ICD-10-CM

## 2022-08-25 DIAGNOSIS — R109 Unspecified abdominal pain: Secondary | ICD-10-CM | POA: Diagnosis not present

## 2022-08-25 NOTE — Patient Instructions (Addendum)
It was great to see you!  Things we discussed today: We are checking a scan of your abdomen on Monday, June 3rd. See information below. I will send you a MyChart message with the results or call if needed. Your blood pressure is elevated today. Please continue your amlodipine and losartan once daily. Please check your blood pressure daily at home and keep a written log of the numbers. Bring this to your follow-up visit. Bring ALL your medications to your follow-up visit with Dr Elliot Gurney.  We will plan to recheck your white blood cell count at your next visit.  Blood Pressure Log Date Medications taken? (Y/N) Blood Pressure Time of Day

## 2022-08-25 NOTE — Progress Notes (Unsigned)
    SUBJECTIVE:   CHIEF COMPLAINT / HPI:   Left Flank Pain Seen for this 10 days ago. Still having pain in her left flank region. Present for 2-3 weeks now although its similar to pain she's had intermittently for many years which has been attributed to multilevel lumbar degenerative disease. She is still very concerned it could be related to her kidneys. Has CT abdomen scheduled in a few days. No urinary symptoms, no fever. Pain seems to be worse with a lot of movement  Leukocytosis f/u Was last seen on 5/21 for this. Has had mild leukocytosis (~12) on multiple CBCs recently. Was having some GI symptoms at the time but these have resolved.  PERTINENT  PMH / PSH: HTN, HLD, h/o left renal infarct  OBJECTIVE:   BP (!) 147/62   Pulse 64   Ht 5\' 4"  (1.626 m)   Wt 198 lb 6.4 oz (90 kg)   SpO2 100%   BMI 34.06 kg/m   General: NAD, pleasant, able to participate in exam Respiratory: No respiratory distress Skin: warm and dry, no rashes noted Psych: Normal affect and mood Neuro: grossly intact Back: no midline tenderness, there is tenderness in left flank region/lateral lumbar paraspinal musculature, FROM although extension elicits pain, 5/5 strength bilateral lower extremities, sensation intact to light touch throughout  ASSESSMENT/PLAN:   Flank pain Has upcoming CT abdomen/pelvis for further evaluation given her h/o renal infarct. However it seems her symptoms are most likely related to her lumbar degenerative disc disease and scoliosis, so pending CT results would recommend physical therapy.  Hypertension BP elevated x2. Unable to perform reliable med rec today as patient unsure of exact medications. Advised home BP monitoring and patient to bring actual physical medications to follow-up visit in 2 weeks.  Leukocytosis Has had mild leukocytosis recently in the presence of infectious symptoms. Infectious symptoms have resolved but through shared decision making, we opted to await the  results of her CT abdomen first and then recheck CBC with peripheral smear at next visit.     Maury Dus, MD [facility]

## 2022-08-27 NOTE — Assessment & Plan Note (Signed)
Has had mild leukocytosis recently in the presence of infectious symptoms. Infectious symptoms have resolved but through shared decision making, we opted to await the results of her CT abdomen first and then recheck CBC with peripheral smear at next visit.

## 2022-08-27 NOTE — Assessment & Plan Note (Addendum)
BP elevated x2. Unable to perform reliable med rec today as patient unsure of exact medications. Advised home BP monitoring and patient to bring actual physical medications to follow-up visit in 2 weeks.

## 2022-08-27 NOTE — Assessment & Plan Note (Addendum)
Has upcoming CT abdomen/pelvis for further evaluation given her h/o renal infarct. However it seems her symptoms are most likely related to her lumbar degenerative disc disease and scoliosis, so pending CT results would recommend physical therapy.

## 2022-08-28 ENCOUNTER — Encounter (HOSPITAL_COMMUNITY): Payer: Self-pay

## 2022-08-28 ENCOUNTER — Ambulatory Visit (HOSPITAL_COMMUNITY)
Admission: RE | Admit: 2022-08-28 | Discharge: 2022-08-28 | Disposition: A | Discharge status: Home / Self Care | Payer: 59 | Source: Ambulatory Visit | Attending: Family Medicine | Admitting: Family Medicine

## 2022-08-28 ENCOUNTER — Telehealth: Payer: Self-pay

## 2022-08-28 DIAGNOSIS — N200 Calculus of kidney: Secondary | ICD-10-CM | POA: Diagnosis not present

## 2022-08-28 DIAGNOSIS — R109 Unspecified abdominal pain: Secondary | ICD-10-CM | POA: Insufficient documentation

## 2022-08-28 DIAGNOSIS — K429 Umbilical hernia without obstruction or gangrene: Secondary | ICD-10-CM | POA: Diagnosis not present

## 2022-08-28 MED ORDER — SODIUM CHLORIDE (PF) 0.9 % IJ SOLN
INTRAMUSCULAR | Status: AC
  Filled 2022-08-28: qty 50

## 2022-08-28 MED ORDER — IOHEXOL 300 MG/ML  SOLN
100.0000 mL | Freq: Once | INTRAMUSCULAR | Status: AC | PRN
  Administered 2022-08-28: 100 mL via INTRAVENOUS

## 2022-08-28 NOTE — Telephone Encounter (Signed)
Patient calls nurse line regarding concerns with elevated WBC.   She reports that she went for CT scan today. Advised that provider would reach out once these results have been read by the radiologist.   Patient requesting to speak with provider regarding next steps. Please call back at [phone].  Veronda Prude, RN

## 2022-08-30 NOTE — Telephone Encounter (Signed)
Patient LVM on  nurse line regarding results. Attempted to call patient back to inform that these are still pending.   She did not answer.   Veronda Prude, RN

## 2022-08-31 ENCOUNTER — Telehealth: Payer: Self-pay | Admitting: Student

## 2022-08-31 NOTE — Telephone Encounter (Signed)
Patient called with home health nurse on the line, wanted to let her doctor know that her A1C was 5.7 today. Patient was also asking if someone could please call her to let her know the results of her MRI she had on 6/3/

## 2022-09-01 NOTE — Telephone Encounter (Signed)
Patient returns call to nurse line regarding results.   Please advise.   Veronda Prude, RN

## 2022-09-01 NOTE — Telephone Encounter (Signed)
Called patient to discuss results. No answer. Left VM that I would try to reach her again this afternoon.   Maury Dus, MD PGY-3, [facility]

## 2022-09-01 NOTE — Telephone Encounter (Signed)
Attempted to call patient again to discuss CT results. No answer. Left VM that I will try to reach her again throughout the weekend as time allows. No urgent findings but I know patient is eager to discuss results.   Maury Dus, MD PGY-3, [facility]

## 2022-09-03 ENCOUNTER — Other Ambulatory Visit: Payer: Self-pay | Admitting: Cardiology

## 2022-09-03 DIAGNOSIS — E785 Hyperlipidemia, unspecified: Secondary | ICD-10-CM

## 2022-09-04 ENCOUNTER — Other Ambulatory Visit: Payer: Self-pay

## 2022-09-04 ENCOUNTER — Other Ambulatory Visit: Payer: Self-pay | Admitting: Student

## 2022-09-04 DIAGNOSIS — E785 Hyperlipidemia, unspecified: Secondary | ICD-10-CM

## 2022-09-04 MED ORDER — CARVEDILOL 12.5 MG PO TABS: ORAL_TABLET | ORAL | 2 refills | Status: DC

## 2022-09-05 ENCOUNTER — Ambulatory Visit (INDEPENDENT_AMBULATORY_CARE_PROVIDER_SITE_OTHER): Payer: 59 | Admitting: *Deleted

## 2022-09-05 DIAGNOSIS — Z0000 Encounter for general adult medical examination without abnormal findings: Secondary | ICD-10-CM | POA: Diagnosis not present

## 2022-09-05 NOTE — Patient Instructions (Signed)
[name] , Thank you for taking time to come for your Medicare Wellness Visit. I appreciate your ongoing commitment to your health goals. Please review the following plan we discussed and let me know if I can assist you in the future.   Screening recommendations/referrals: Colonoscopy: up to date Mammogram: up to date Bone Density: Education provided Recommended yearly ophthalmology/optometry visit for glaucoma screening and checkup Recommended yearly dental visit for hygiene and checkup  Vaccinations: Influenza vaccine: up to date Pneumococcal vaccine: up to date Tdap vaccine: up to date Shingles vaccine: Education provided    Advanced directives: Education provided     Preventive Care 65 Years and Older, Female Preventive care refers to lifestyle choices and visits with your health care provider that can promote health and wellness. What does preventive care include? A yearly physical exam. This is also called an annual well check. Dental exams once or twice a year. Routine eye exams. Ask your health care provider how often you should have your eyes checked. Personal lifestyle choices, including: Daily care of your teeth and gums. Regular physical activity. Eating a healthy diet. Avoiding tobacco and drug use. Limiting alcohol use. Practicing safe sex. Taking low-dose aspirin every day. Taking vitamin and mineral supplements as recommended by your health care provider. What happens during an annual well check? The services and screenings done by your health care provider during your annual well check will depend on your age, overall health, lifestyle risk factors, and family history of disease. Counseling  Your health care provider may ask you questions about your: Alcohol use. Tobacco use. Drug use. Emotional well-being. Home and relationship well-being. Sexual activity. Eating habits. History of falls. Memory and ability to understand (cognition). Work and work  Astronomer. Reproductive health. Screening  You may have the following tests or measurements: Height, weight, and BMI. Blood pressure. Lipid and cholesterol levels. These may be checked every 5 years, or more frequently if you are over 57 years old. Skin check. Lung cancer screening. You may have this screening every year starting at age 69 if you have a 30-pack-year history of smoking and currently smoke or have quit within the past 15 years. Fecal occult blood test (FOBT) of the stool. You may have this test every year starting at age 17. Flexible sigmoidoscopy or colonoscopy. You may have a sigmoidoscopy every 5 years or a colonoscopy every 10 years starting at age 75. Hepatitis C blood test. Hepatitis B blood test. Sexually transmitted disease (STD) testing. Diabetes screening. This is done by checking your blood sugar (glucose) after you have not eaten for a while (fasting). You may have this done every 1-3 years. Bone density scan. This is done to screen for osteoporosis. You may have this done starting at age 90. Mammogram. This may be done every 1-2 years. Talk to your health care provider about how often you should have regular mammograms. Talk with your health care provider about your test results, treatment options, and if necessary, the need for more tests. Vaccines  Your health care provider may recommend certain vaccines, such as: Influenza vaccine. This is recommended every year. Tetanus, diphtheria, and acellular pertussis (Tdap, Td) vaccine. You may need a Td booster every 10 years. Zoster vaccine. You may need this after age 40. Pneumococcal 13-valent conjugate (PCV13) vaccine. One dose is recommended after age 56. Pneumococcal polysaccharide (PPSV23) vaccine. One dose is recommended after age 48. Talk to your health care provider about which screenings and vaccines you need and how often  you need them. This information is not intended to replace advice given to you by  your health care provider. Make sure you discuss any questions you have with your health care provider. Document Released: 04/09/2015 Document Revised: 12/01/2015 Document Reviewed: 01/12/2015 Elsevier Interactive Patient Education  2017 [place] Prevention in the Home Falls can cause injuries. They can happen to people of all ages. There are many things you can do to make your home safe and to help prevent falls. What can I do on the outside of my home? Regularly fix the edges of walkways and driveways and fix any cracks. Remove anything that might make you trip as you walk through a door, such as a raised step or threshold. Trim any bushes or trees on the path to your home. Use bright outdoor lighting. Clear any walking paths of anything that might make someone trip, such as rocks or tools. Regularly check to see if handrails are loose or broken. Make sure that both sides of any steps have handrails. Any raised decks and porches should have guardrails on the edges. Have any leaves, snow, or ice cleared regularly. Use sand or salt on walking paths during winter. Clean up any spills in your garage right away. This includes oil or grease spills. What can I do in the bathroom? Use night lights. Install grab bars by the toilet and in the tub and shower. Do not use towel bars as grab bars. Use non-skid mats or decals in the tub or shower. If you need to sit down in the shower, use a plastic, non-slip stool. Keep the floor dry. Clean up any water that spills on the floor as soon as it happens. Remove soap buildup in the tub or shower regularly. Attach bath mats securely with double-sided non-slip rug tape. Do not have throw rugs and other things on the floor that can make you trip. What can I do in the bedroom? Use night lights. Make sure that you have a light by your bed that is easy to reach. Do not use any sheets or blankets that are too big for your bed. They should not hang  down onto the floor. Have a firm chair that has side arms. You can use this for support while you get dressed. Do not have throw rugs and other things on the floor that can make you trip. What can I do in the kitchen? Clean up any spills right away. Avoid walking on wet floors. Keep items that you use a lot in easy-to-reach places. If you need to reach something above you, use a strong step stool that has a grab bar. Keep electrical cords out of the way. Do not use floor polish or wax that makes floors slippery. If you must use wax, use non-skid floor wax. Do not have throw rugs and other things on the floor that can make you trip. What can I do with my stairs? Do not leave any items on the stairs. Make sure that there are handrails on both sides of the stairs and use them. Fix handrails that are broken or loose. Make sure that handrails are as long as the stairways. Check any carpeting to make sure that it is firmly attached to the stairs. Fix any carpet that is loose or worn. Avoid having throw rugs at the top or bottom of the stairs. If you do have throw rugs, attach them to the floor with carpet tape. Make sure that you have a light  switch at the top of the stairs and the bottom of the stairs. If you do not have them, ask someone to add them for you. What else can I do to help prevent falls? Wear shoes that: Do not have high heels. Have rubber bottoms. Are comfortable and fit you well. Are closed at the toe. Do not wear sandals. If you use a stepladder: Make sure that it is fully opened. Do not climb a closed stepladder. Make sure that both sides of the stepladder are locked into place. Ask someone to hold it for you, if possible. Clearly mark and make sure that you can see: Any grab bars or handrails. First and last steps. Where the edge of each step is. Use tools that help you move around (mobility aids) if they are needed. These  include: Canes. Walkers. Scooters. Crutches. Turn on the lights when you go into a dark area. Replace any light bulbs as soon as they burn out. Set up your furniture so you have a clear path. Avoid moving your furniture around. If any of your floors are uneven, fix them. If there are any pets around you, be aware of where they are. Review your medicines with your doctor. Some medicines can make you feel dizzy. This can increase your chance of falling. Ask your doctor what other things that you can do to help prevent falls. This information is not intended to replace advice given to you by your health care provider. Make sure you discuss any questions you have with your health care provider. Document Released: 01/07/2009 Document Revised: 08/19/2015 Document Reviewed: 04/17/2014 Elsevier Interactive Patient Education  2017 [employer].

## 2022-09-05 NOTE — Telephone Encounter (Signed)
I called patient to do her AWV, she was crying about her flank pain.    Stated she doesn't understand why she is still having this pain, and states it is constant is at a level 10 when asked.      She would like someone to call her back to discuss options, she has had a CT.     Did not want to schedule appointment at time.

## 2022-09-05 NOTE — Progress Notes (Signed)
Subjective:   [name] is a 72 y.o. female who presents for Medicare Annual (Subsequent) preventive examination.  I connected with  Laurel Dimmer on 09/05/22 by a telephone enabled telemedicine application and verified that I am speaking with the correct person using two identifiers.   I discussed the limitations of evaluation and management by telemedicine. The patient expressed understanding and agreed to proceed.  Patient location: home  Provider location: telephone/home  .   Review of Systems     Cardiac Risk Factors include: advanced age (>17men, >35 women);hypertension;family history of premature cardiovascular disease;obesity (BMI >30kg/m2)     Objective:    Today's Vitals   09/05/22 1306  PainSc: 10-Worst pain ever   There is no height or weight on file to calculate BMI.     09/05/2022    1:08 PM 08/25/2022   11:48 AM 08/15/2022    1:33 PM 08/09/2022   10:01 AM 07/13/2022    2:48 PM 03/10/2022    2:22 PM 02/27/2022    2:26 PM  Advanced Directives  Does Patient Have a Medical Advance Directive? No No No No No No No  Would patient like information on creating a medical advance directive?    No - Patient declined  No - Patient declined     Current Medications (verified) Outpatient Encounter Medications as of 09/05/2022  Medication Sig   amLODipine (NORVASC) 5 MG tablet TAKE 1 TABLET(5 MG) BY MOUTH DAILY   aspirin EC 81 MG tablet Take 1 tablet (81 mg total) daily by mouth.   atorvastatin (LIPITOR) 80 MG tablet TAKE 1 TABLET(80 MG) BY MOUTH DAILY AT 6 PM   Capsaicin-Menthol-Methyl Sal (CAPSAICIN-METHYL SAL-MENTHOL) 0.025-1-12 % CREA Apply 1 Tube topically daily.   carvedilol (COREG) 12.5 MG tablet TAKE 1 TABLET(12.5 MG) BY MOUTH TWICE DAILY WITH A MEAL   ciclopirox (PENLAC) 8 % solution Apply topically at bedtime. Apply over nail and surrounding skin. Apply daily over previous coat. After seven (7) days, may remove with alcohol and continue cycle.   diclofenac  Sodium (VOLTAREN) 1 % GEL APPLY 2 GRAMS TOPICALLY TO THE AFFECTED AREA FOUR TIMES DAILY   ezetimibe (ZETIA) 10 MG tablet Take by mouth.   fluticasone (FLONASE) 50 MCG/ACT nasal spray SHAKE LIQUID AND USE 2 SPRAYS IN EACH NOSTRIL TWICE DAILY   furosemide (LASIX) 40 MG tablet Take 1 tablet (40 mg total) by mouth daily.   gabapentin (NEURONTIN) 300 MG capsule Take 1 capsule (300 mg total) by mouth 2 (two) times daily.   hydrocortisone 2.5 % ointment Apply topically 2 (two) times daily.   ibuprofen (ADVIL) 600 MG tablet Take 1 tablet (600 mg total) by mouth every 8 (eight) hours as needed.   isosorbide mononitrate (IMDUR) 60 MG 24 hr tablet TAKE 1 TABLET(60 MG) BY MOUTH DAILY   lidocaine (HM LIDOCAINE PATCH) 4 % Place 1 patch onto the skin daily.   losartan (COZAAR) 100 MG tablet TAKE 1 TABLET(100 MG) BY MOUTH DAILY   meloxicam (MOBIC) 7.5 MG tablet Take 1 tablet (7.5 mg total) by mouth daily.   Multiple Vitamin (MULITIVITAMIN WITH MINERALS) TABS Take 1 tablet by mouth daily.   nitroGLYCERIN (NITROSTAT) 0.4 MG SL tablet PLACE 1 TABLET UNDER THE TONGUE EVERY 5 MINUTES AS NEEDED.   potassium chloride SA (KLOR-CON M) 20 MEQ tablet Take 1 tablet (20 mEq total) by mouth 2 (two) times daily.   No facility-administered encounter medications on file as of 09/05/2022.    Allergies (verified) Shrimp [shellfish allergy],  Latex, and Penicillins   History: Past Medical History:  Diagnosis Date   Abnormal EKG 2012   hospitalized for T wave inversion in lateral leads with MSK chest pains, normal ECHO, negative trops, no cardiology consult. recent EKG 7/15 stable T wave inversions.    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   Arthritis 2000   Arthritis 2011   Bilateral leg pain 06/14/2015   Clotting disorder (HCC)    blood clot kidney 2017   Heart murmur    Hyperlipidemia 2011   Hypertension 2011   Left elbow pain 03/31/2020   Meniscus tear 2000   L KNEE   Pelvic pain 10/07/2018   Renal infarct (HCC)  2017   blood clot in the kidney   Past Surgical History:  Procedure Laterality Date   ABDOMINAL SURGERY  2010   for bowel blockage   COLON SURGERY  2010   bowel blockage   COLONOSCOPY  09/2014   HYSTEROSCOPY WITH D & C N/A 05/08/2018   Procedure: DILATATION AND CURETTAGE /HYSTEROSCOPY;  Surgeon: Catalina Antigua, MD;  Location: [facility];  Service: Gynecology;  Laterality: N/A;   KNEE ARTHROSCOPY Left 09/25/2013   Procedure: LEFT KNEE ARTHROSCOPY WITH PARTIAL MEDIAL AND LATERAL  MENISCECTOMY/DEBRIDEMENT/MICRO FRACTURE MEDIAL FEMORAL CONDYLE, REMOVAL OF OSTEOCONDRAL FRAGMENT;  Surgeon: Javier Docker, MD;  Location: [facility];  Service: Orthopedics;  Laterality: Left;   KNEE SURGERY Right 1999   LEFT HEART CATH AND CORONARY ANGIOGRAPHY N/A 12/13/2020   Procedure: LEFT HEART CATH AND CORONARY ANGIOGRAPHY;  Surgeon: Yvonne Kendall, MD;  Location: [facility];  Service: Cardiovascular;  Laterality: N/A;   POLYPECTOMY     TEE WITHOUT CARDIOVERSION N/A 10/04/2015   Procedure: TRANSESOPHAGEAL ECHOCARDIOGRAM (TEE);  Surgeon: Quintella Reichert, MD;  Location: [facility];  Service: Cardiovascular;  Laterality: N/A;   TUBAL LIGATION  1978   Family History  Problem Relation Age of Onset   Hypertension Mother    Heart disease Mother        has a pacemaker    Alzheimer's disease Mother    Hyperlipidemia Mother    Osteoporosis Mother    Heart failure Father    Hypertension Father    Alzheimer's disease Father    Hypertension Sister    Breast cancer Sister    Hypertension Sister    Gout Sister    Heart disease Son    Alcohol abuse Son    Pancreatitis Son    Hypertension Son    Cancer Neg Hx    Colon cancer Neg Hx    Esophageal cancer Neg Hx    Stomach cancer Neg Hx    Rectal cancer Neg Hx    Social History   Socioeconomic History   Marital status: Single    Spouse name: Not on file   Number of children: 2   Years of education: 12   Highest education level: 12th  grade  Occupational History   Occupation: Custodian    Employer: [employer]    Comment: RETIRED  Tobacco Use   Smoking status: Former    Types: Cigarettes    Quit date: 07/29/2010    Years since quitting: 12.1    Passive exposure: Past   Smokeless tobacco: Never  Vaping Use   Vaping Use: Never used  Substance and Sexual Activity   Alcohol use: Not Currently   Drug use: Never   Sexual activity: Not Currently    Birth control/protection: Surgical    Comment: 1st intercourse 72  yo-Fewer than 5 partners  Other Topics Concern   Not on file  Social History Narrative   Patient and her one of her sons live together in [place].   Patient has another son who resides in [place], [place].   Never married      Patient enjoys spending time at a senior recreational center. Senior activities and dinners.       Patient walks ~15 minutes per day.   Social Determinants of Health   Financial Resource Strain: Low Risk  (09/05/2022)   Overall Financial Resource Strain (CARDIA)    Difficulty of Paying Living Expenses: Not hard at all  Food Insecurity: No Food Insecurity (09/05/2022)   Hunger Vital Sign    Worried About Running Out of Food in the Last Year: Never true    Ran Out of Food in the Last Year: Never true  Transportation Needs: No Transportation Needs (09/05/2022)   PRAPARE - Administrator, Civil Service (Medical): No    Lack of Transportation (Non-Medical): No  Physical Activity: Insufficiently Active (09/05/2022)   Exercise Vital Sign    Days of Exercise per Week: 3 days    Minutes of Exercise per Session: 10 min  Stress: No Stress Concern Present (09/05/2022)   [employer] of Occupational Health - Occupational Stress Questionnaire    Feeling of Stress : Not at all  Social Connections: Moderately Integrated (09/05/2022)   Social Connection and Isolation Panel [NHANES]    Frequency of Communication with Friends and Family: More than three times a week     Frequency of Social Gatherings with Friends and Family: Twice a week    Attends Religious Services: More than 4 times per year    Active Member of [employer] or Organizations: Yes    Attends Engineer, structural: More than 4 times per year    Marital Status: Never married    Tobacco Counseling Counseling given: Not Answered   Clinical Intake:  Pre-visit preparation completed: Yes  Pain : 0-10 Pain Score: 10-Worst pain ever Pain Location: Other (Comment) (flank pain) Pain Orientation: Left Pain Descriptors / Indicators: Constant, Grimacing, Aching, Shooting, Sharp Pain Relieving Factors: tylenol Effect of Pain on Daily Activities: yes  Pain Relieving Factors: tylenol  Diabetes: No  How often do you need to have someone help you when you read instructions, pamphlets, or other written materials from your doctor or pharmacy?: 1 - Never  Diabetic?  no  Interpreter Needed?: No  Information entered by :: Remi Haggard LPN   Activities of Daily Living    09/05/2022    1:11 PM 11/01/2021    2:30 PM  In your present state of health, do you have any difficulty performing the following activities:  Hearing? 0 0  Vision? 0 0  Difficulty concentrating or making decisions? 0 0  Walking or climbing stairs? 0 0  Dressing or bathing? 0 0  Doing errands, shopping? 0 0  Preparing Food and eating ? N N  Using the Toilet? N N  In the past six months, have you accidently leaked urine? N N  Do you have problems with loss of bowel control? N N  Managing your Medications? N N  Managing your Finances? N N  Housekeeping or managing your Housekeeping? N N    Patient Care Team: Jerre Simon, MD as PCP - General (Family Medicine) Little Ishikawa, MD as PCP - Cardiology (Cardiology) Lennette Bihari, MD as PCP - Sleep Medicine (Cardiology) Jene Every, MD  as Consulting Physician (Orthopedic Surgery)  Indicate any recent Medical Services you may have received from other than  [facility] providers in the past year (date may be approximate).     Assessment:   This is a routine wellness examination for [name].  Hearing/Vision screen Hearing Screening - Comments:: No trouble hearing Vision Screening - Comments:: Up to date My eye doctor   Dietary issues and exercise activities discussed: Current Exercise Habits: Home exercise routine, Type of exercise: strength training/weights, Time (Minutes): 15, Frequency (Times/Week): 3, Weekly Exercise (Minutes/Week): 45, Intensity: Mild   Goals Addressed             This Visit's Progress    Increase physical activity         Depression Screen    09/05/2022    1:15 PM 08/15/2022    1:34 PM 07/13/2022    2:48 PM 05/05/2022    1:23 PM 04/19/2022    4:02 PM 03/10/2022    2:25 PM 02/27/2022    4:58 PM  PHQ 2/9 Scores  PHQ - 2 Score 0 0 0 0 0 0 0  PHQ- 9 Score 0 0 0 0 0 0 0    Fall Risk    09/05/2022    1:09 PM 08/09/2022   10:01 AM 05/05/2022    1:24 PM 04/19/2022    3:56 PM 12/06/2021    1:39 PM  Fall Risk   Falls in the past year? 0 0 0 1 0  Number falls in past yr: 0 0 0 0 0  Injury with Fall? 0 0 0  0  Follow up Falls evaluation completed;Education provided;Falls prevention discussed        FALL RISK PREVENTION PERTAINING TO THE HOME:  Any stairs in or around the home? Yes  If so, are there any without handrails? No  Home free of loose throw rugs in walkways, pet beds, electrical cords, etc? No  Adequate lighting in your home to reduce risk of falls? No   ASSISTIVE DEVICES UTILIZED TO PREVENT FALLS:  Life alert? No  Use of a cane, walker or w/c? Yes  Grab bars in the bathroom? Yes  Shower chair or bench in shower? No  Elevated toilet seat or a handicapped toilet? No   TIMED UP AND GO:  Was the test performed? No .    Cognitive Function:        09/05/2022    1:12 PM 11/01/2021    2:31 PM  6CIT Screen  What Year? 0 points 0 points  What month? 0 points 0 points  What time? 0 points 0 points   Count back from 20 0 points 0 points  Months in reverse 0 points 0 points  Repeat phrase 0 points 0 points  Total Score 0 points 0 points    Immunizations Immunization History  Administered Date(s) Administered   COVID-19, mRNA, vaccine(Comirnaty)12 years and older 05/05/2022   Fluad Quad(high Dose 65+) 05/04/2021   Influenza,inj,Quad PF,6+ Mos 03/09/2015   Influenza-Unspecified 02/08/2014   PFIZER Comirnaty(Gray Top)Covid-19 Tri-Sucrose Vaccine 05/03/2019, 01/15/2020   PFIZER(Purple Top)SARS-COV-2 Vaccination 05/24/2019   PNEUMOCOCCAL CONJUGATE-20 12/06/2021   Pfizer Covid-19 Vaccine Bivalent Booster 75yrs & up 05/04/2021   Pneumococcal Conjugate-13 03/12/2017   Tdap 09/12/2015    TDAP status: Up to date  Flu Vaccine status: Up to date  Pneumococcal vaccine status: Up to date  Covid-19 vaccine status: Information provided on how to obtain vaccines.   Qualifies for Shingles Vaccine? Yes   Zostavax completed  No   Shingrix Completed?: No.    Education has been provided regarding the importance of this vaccine. Patient has been advised to call insurance company to determine out of pocket expense if they have not yet received this vaccine. Advised may also receive vaccine at local pharmacy or Health Dept. Verbalized acceptance and understanding.  Screening Tests Health Maintenance  Topic Date Due   COVID-19 Vaccine (6 - 2023-24 season) 09/21/2022 (Originally 06/30/2022)   Zoster Vaccines- Shingrix (1 of 2) 12/06/2022 (Originally 08/30/1969)   INFLUENZA VACCINE  10/26/2022   MAMMOGRAM  04/08/2023   Medicare Annual Wellness (AWV)  09/05/2023   DTaP/Tdap/Td (2 - Td or Tdap) 09/11/2025   Colonoscopy  12/29/2025   Pneumonia Vaccine 65+ Years old  Completed   DEXA SCAN  Completed   Hepatitis C Screening  Completed   HPV VACCINES  Aged Out    Health Maintenance  There are no preventive care reminders to display for this patient.   Colorectal cancer screening: Type of  screening: Colonoscopy. Completed 2020. Repeat every 7 years  Mammogram status: Completed  . Repeat every year  Bone Density :  completed 2018  Lung Cancer Screening: (Low Dose CT Chest recommended if Age 76-80 years, 30 pack-year currently smoking OR have quit w/in 15years.) does not qualify.   Lung Cancer Screening Referral:   Additional Screening:  Hepatitis C Screening :   never done  Vision Screening: Recommended annual ophthalmology exams for early detection of glaucoma and other disorders of the eye. Is the patient up to date with their annual eye exam?  Yes  Who is the provider or what is the name of the office in which the patient attends annual eye exams? [facility]  If pt is not established with a provider, would they like to be referred to a provider to establish care? No .   Dental Screening: Recommended annual dental exams for proper oral hygiene  Community Resource Referral / Chronic Care Management: CRR required this visit?  No   CCM required this visit?  No      Plan:     I have personally reviewed and noted the following in the patient's chart:   Medical and social history Use of alcohol, tobacco or illicit drugs  Current medications and supplements including opioid prescriptions. Patient is not currently taking opioid prescriptions. Functional ability and status Nutritional status Physical activity Advanced directives List of other physicians Hospitalizations, surgeries, and ER visits in previous 12 months Vitals Screenings to include cognitive, depression, and falls Referrals and appointments  In addition, I have reviewed and discussed with patient certain preventive protocols, quality metrics, and best practice recommendations. A written personalized care plan for preventive services as well as general preventive health recommendations were provided to patient.     Remi Haggard, LPN   2/84/1324   Nurse Notes:   sent a telephone message to MD  , about patient flank pain not resolving.   Patient wanted a phone call.

## 2022-09-06 NOTE — Telephone Encounter (Signed)
Called and verified patient after reviewing a message from the RN that patient is still complaining of abdominal pain.  Informed patient her most recent abdominal CT showed a right kidney stone and diverticulosis but no acute findings otherwise.  She reports she is still having left abdominal pain and 10 out of 10 and has only been using Tylenol with mild to no improvement.  She denies having any fever, chills, abdominal distention, nausea, vomiting, bloody stool, constipation or diarrhea.  Patient's that being her CT abdomen was unremarkable she would most likely need to be seen in and patient reports having an appointment on 6/20.  Informed patient she has the option of being seen earlier or discussed possibility of following up to the ED for additional work up. Patient is reluctance to go to the ED or schedule an earlier appointment.  Reviewed signs and symptoms that warrant ED visits.  Informed patient that her recent brain MRI was negative.

## 2022-09-07 ENCOUNTER — Telehealth: Payer: Self-pay

## 2022-09-07 MED ORDER — TRAMADOL HCL 50 MG PO TABS: 50.0000 mg | ORAL_TABLET | Freq: Three times a day (TID) | ORAL | 0 refills | Status: DC | PRN

## 2022-09-07 NOTE — Telephone Encounter (Signed)
I called to check on this patient as requested. She stated that she requested pain medicine for her stomach did not receive any. Her PCP already discussed CT abdomen with her. I also reviewed this with her. She has F/U appointment for ongoing belly pain. Return precautions discussed.  Tramadol e-scribed pending her f/u appointment.  NB: She also asked about her elevated WBC. This could be inflammatory from any cause. She denies fever. Plan to recheck at the next visit. She agreed with the plan.  The patient was appreciative of the call. F/U PCP as planned.

## 2022-09-07 NOTE — Telephone Encounter (Signed)
Patient LVM x 4 on nurse line. Returned call to patient. She states that she spoke with Dr. Elliot Gurney yesterday and would like to speak with supervising physician regarding concerns.   She is also asking about her imaging results and if she needs to be concerned. She states that she has not spoken with "any provider" regarding results.   Per note from yesterday, Dr. Elliot Gurney spoke with her regarding her results and continued abdominal pain. Dr. Anner Crete also spoke with patient on Monday morning.   Will forward to Dr. Lum Babe regarding patient's concerns.   Veronda Prude, RN

## 2022-09-07 NOTE — Telephone Encounter (Signed)
Late entry. Spoke with patient Monday morning regarding CT abdomen results.  Discussed that her kidneys looked fine, which was her main concern. Informed that her left sided pain is likely related to her known degenerative disc disease and scoliosis as previously thought.   Informed of hernia. Offered referral to general surgery but patient declines. She would like Tramadol for pain. Advised she would need office visit to discuss and this was scheduled with Dr Elliot Gurney on 6/20.   Maury Dus, MD PGY-3, [facility]

## 2022-09-14 ENCOUNTER — Encounter: Payer: Self-pay | Admitting: Student

## 2022-09-14 ENCOUNTER — Ambulatory Visit: Payer: Self-pay | Admitting: Student

## 2022-09-14 ENCOUNTER — Ambulatory Visit (INDEPENDENT_AMBULATORY_CARE_PROVIDER_SITE_OTHER): Payer: 59 | Admitting: Student

## 2022-09-14 VITALS — BP 128/60 | HR 63 | Ht 64.0 in | Wt 197.0 lb

## 2022-09-14 DIAGNOSIS — R109 Unspecified abdominal pain: Secondary | ICD-10-CM

## 2022-09-14 DIAGNOSIS — M25552 Pain in left hip: Secondary | ICD-10-CM | POA: Diagnosis not present

## 2022-09-14 DIAGNOSIS — D72829 Elevated white blood cell count, unspecified: Secondary | ICD-10-CM | POA: Diagnosis not present

## 2022-09-14 DIAGNOSIS — I10 Essential (primary) hypertension: Secondary | ICD-10-CM

## 2022-09-14 MED ORDER — FUROSEMIDE 40 MG PO TABS
40.0000 mg | ORAL_TABLET | Freq: Every day | ORAL | 9 refills | Status: DC
Start: 2022-09-14 — End: 2023-09-24

## 2022-09-14 MED ORDER — TRAMADOL HCL 50 MG PO TABS
50.0000 mg | ORAL_TABLET | Freq: Three times a day (TID) | ORAL | 0 refills | Status: DC | PRN
Start: 2022-09-14 — End: 2022-10-05

## 2022-09-14 NOTE — Assessment & Plan Note (Signed)
This looks to be a chronic issue.  Chart review show patient has continuously have elevated white counts as far back as 6 years ago. Suspect this could be her baseline with no signs of infection or unexplained weight loss.  She has stable vitals and patient denies any fever or chills. -Repeat CBC -Provided reassurance -Continue to monitor CBC closely and will need evaluation if WBC greater than 15

## 2022-09-14 NOTE — Assessment & Plan Note (Signed)
Overall poor historian but continues to complain of flank pain. Recent abdominal CT was unrevealing for intra-abdominal process or renal causes.  Does not have any red flag symptoms. Suspect pain could be arthralgic and given she has established care with [facility], advised patient to follow-up closely with her orthopedic team.  Will send in refill for tramadol for pain relief until she sees Ortho.  - Refilled tramadol - Plan discussed with patient who verbalized understanding. - Reviewed signs symptoms that would warrant ED visit.

## 2022-09-14 NOTE — Patient Instructions (Addendum)
It was wonderful to meet you today. Thank you for allowing me to be a part of your care. Below is a short summary of what we discussed at your visit today:  Your blood pressure today was normal.  I recommend you check your blood pressures daily and keep a record of those.  I Have refilled your furosemide today.  I have refilled your Tramadol for pain to cover you until you see your orthopedic doctor for your hip pain.  I Do not suspect your back pain to be due to kidney given that your most recent abdominal CT was negative for any kidney problems.  Today we will recheck your white blood cell count but I think this is normal white count level. It has historically been this high. Will keep an eye on it.  Please follow-up with our pharmacy team for medication reconciliation and blood pressure  If you have any questions or concerns, please do not hesitate to contact us via phone or MyChart message.   Jerre Simon, MD [facility]

## 2022-09-14 NOTE — Assessment & Plan Note (Signed)
BP today is appropriate.  Unfortunately patient continues to forget to keep a blood pressure diary to monitor her ambulatory blood pressures.  She endorses compliance to her medication. -Encourage patient to continue home medication -Advised keeping daily ambulatory blood pressure diary -Recommed follows up with pharmacy for complete medication reconciliation.

## 2022-09-14 NOTE — Progress Notes (Signed)
    SUBJECTIVE:   CHIEF COMPLAINT / HPI:   72 year old female presenting today for flank pain.  She reports this has been going on for over a year. Describes pain as dull pain but vague about radiation to RLE (initially described bilateral RE numbness) Recently given tramadol with mild relieved  Cants stand up for a prolonged period of time  Pain is worse in the morning and mild improvement during the day Denies any recent trauma to the back. Establish care with [facility] but mostly for her knees   PERTINENT  PMH / PSH: Reviewed  OBJECTIVE:   BP 128/60 (BP Location: Left Arm, Patient Position: Sitting, Cuff Size: Normal)   Pulse 63   Ht 5\' 4"  (1.626 m)   Wt 197 lb (89.4 kg)   SpO2 98%   BMI 33.81 kg/m    Physical Exam General: Alert, well appearing, NAD Cardiovascular: RRR, No Murmurs, Normal S2/S2 Respiratory: CTAB, No wheezing or Rales Abdomen: No distension or tenderness  Left Hip Exam No deformity. FROM with 5/5 strength except 4/5 hip abduction with pain. No tenderness to palpation within gluteal musculature.  No trochanter, SI joint, piriformis tenderness. No tenderness to palpation over greater trochanter. NVI distally. Negative logroll. Normal gait but slightly hunged    ASSESSMENT/PLAN:   Hypertension BP today is appropriate.  Unfortunately patient continues to forget to keep a blood pressure diary to monitor her ambulatory blood pressures.  She endorses compliance to her medication. -Encourage patient to continue home medication -Advised keeping daily ambulatory blood pressure diary -Recommed follows up with pharmacy for complete medication reconciliation.  Leukocytosis This looks to be a chronic issue.  Chart review show patient has continuously have elevated white counts as far back as 6 years ago. Suspect this could be her baseline with no signs of infection or unexplained weight loss.  She has stable vitals and patient denies any fever or  chills. -Repeat CBC -Provided reassurance -Continue to monitor CBC closely and will need evaluation if WBC greater than 15  Flank pain Overall poor historian but continues to complain of flank pain. Recent abdominal CT was unrevealing for intra-abdominal process or renal causes.  Does not have any red flag symptoms. Suspect pain could be arthralgic and given she has established care with [facility], advised patient to follow-up closely with her orthopedic team.  Will send in refill for tramadol for pain relief until she sees Ortho.  - Refilled tramadol - Plan discussed with patient who verbalized understanding. - Reviewed signs symptoms that would warrant ED visit.     Jerre Simon, MD [facility]

## 2022-09-14 NOTE — Progress Notes (Signed)
Htn & leukocytosis. Med refill - Furosemide Pain on lower L flank radiating to L leg

## 2022-09-15 LAB — CBC WITH DIFFERENTIAL/PLATELET
Basophils Absolute: 0.1 10*3/uL (ref 0.0–0.2)
Basos: 1 %
EOS (ABSOLUTE): 0.3 10*3/uL (ref 0.0–0.4)
Eos: 3 %
Hematocrit: 34.1 % (ref 34.0–46.6)
Hemoglobin: 11.2 g/dL (ref 11.1–15.9)
Immature Grans (Abs): 0 10*3/uL (ref 0.0–0.1)
Immature Granulocytes: 0 %
Lymphocytes Absolute: 1.9 10*3/uL (ref 0.7–3.1)
Lymphs: 18 %
MCH: 30.9 pg (ref 26.6–33.0)
MCHC: 32.8 g/dL (ref 31.5–35.7)
MCV: 94 fL (ref 79–97)
Monocytes Absolute: 0.8 10*3/uL (ref 0.1–0.9)
Monocytes: 7 %
Neutrophils Absolute: 7.9 10*3/uL — ABNORMAL HIGH (ref 1.4–7.0)
Neutrophils: 71 %
Platelets: 302 10*3/uL (ref 150–450)
RBC: 3.63 x10E6/uL — ABNORMAL LOW (ref 3.77–5.28)
RDW: 12.6 % (ref 11.7–15.4)
WBC: 11 10*3/uL — ABNORMAL HIGH (ref 3.4–10.8)

## 2022-09-18 ENCOUNTER — Telehealth: Payer: Self-pay

## 2022-09-18 NOTE — Telephone Encounter (Signed)
[employer] placard placed up front for pick up.   Copy made for batch scanning.   Patient aware.  

## 2022-10-04 ENCOUNTER — Other Ambulatory Visit: Payer: Self-pay | Admitting: Student

## 2022-10-04 DIAGNOSIS — M25552 Pain in left hip: Secondary | ICD-10-CM

## 2022-10-05 ENCOUNTER — Other Ambulatory Visit: Payer: Self-pay | Admitting: Student

## 2022-10-05 ENCOUNTER — Other Ambulatory Visit: Payer: Self-pay

## 2022-10-05 DIAGNOSIS — E876 Hypokalemia: Secondary | ICD-10-CM

## 2022-10-05 MED ORDER — CARVEDILOL 12.5 MG PO TABS: ORAL_TABLET | ORAL | 2 refills | Status: DC

## 2022-10-05 NOTE — Progress Notes (Signed)
Called and verified patient before proceeding with conversation. Patient had called into her pharmacy for refill of her tramadol.  In the conversation patient appeared to have forgotten the plan from previous visits in which we agreed that I will refill tramadol for 1 week with plan to follow-up with her orthopedic specialist.  Patient reports she has not seen or scheduled an appointment with her orthopedic specialist. This is not unusual for patient to forget plans after every visit.  At her last visit I attempted to have a discussion of possibly getting assistance with her medical management however patient was strongly adamant about this option.  This time I informed patient I will refill her tramadol for 2 weeks and hesitant to continue long term opioid however she should see her orthopedic specialist to discuss and evaluate her back pain.  Patient verbalized understanding and agreeable to plan.  Also requested for carvedilol to be refilled.  Sent in prescription for patient's tramadol for 2 weeks and refilled her carvedilol.

## 2022-10-06 ENCOUNTER — Other Ambulatory Visit: Payer: Self-pay | Admitting: Student

## 2022-10-06 DIAGNOSIS — E876 Hypokalemia: Secondary | ICD-10-CM

## 2022-10-06 MED ORDER — POTASSIUM CHLORIDE CRYS ER 20 MEQ PO TBCR
20.0000 meq | EXTENDED_RELEASE_TABLET | Freq: Two times a day (BID) | ORAL | 0 refills | Status: DC
Start: 2022-10-06 — End: 2022-10-09

## 2022-10-11 ENCOUNTER — Encounter: Payer: Self-pay | Admitting: Podiatry

## 2022-10-11 ENCOUNTER — Ambulatory Visit: Payer: 59 | Admitting: Podiatry

## 2022-10-11 DIAGNOSIS — B351 Tinea unguium: Secondary | ICD-10-CM | POA: Diagnosis not present

## 2022-10-11 DIAGNOSIS — M79675 Pain in left toe(s): Secondary | ICD-10-CM

## 2022-10-11 DIAGNOSIS — M79674 Pain in right toe(s): Secondary | ICD-10-CM

## 2022-10-11 NOTE — Progress Notes (Signed)

## 2022-10-12 ENCOUNTER — Other Ambulatory Visit: Payer: Self-pay | Admitting: Student

## 2022-10-12 DIAGNOSIS — M1712 Unilateral primary osteoarthritis, left knee: Secondary | ICD-10-CM

## 2022-11-02 ENCOUNTER — Other Ambulatory Visit: Payer: Self-pay | Admitting: Student

## 2022-11-02 DIAGNOSIS — M1712 Unilateral primary osteoarthritis, left knee: Secondary | ICD-10-CM

## 2022-11-13 DIAGNOSIS — L821 Other seborrheic keratosis: Secondary | ICD-10-CM | POA: Diagnosis not present

## 2022-11-13 DIAGNOSIS — L218 Other seborrheic dermatitis: Secondary | ICD-10-CM | POA: Diagnosis not present

## 2022-12-05 ENCOUNTER — Encounter (HOSPITAL_COMMUNITY): Payer: Self-pay

## 2022-12-05 ENCOUNTER — Ambulatory Visit (HOSPITAL_COMMUNITY)
Admission: EM | Admit: 2022-12-05 | Discharge: 2022-12-05 | Disposition: A | Discharge status: Home / Self Care | Payer: 59 | Attending: Family Medicine | Admitting: Family Medicine

## 2022-12-05 DIAGNOSIS — Z1152 Encounter for screening for COVID-19: Secondary | ICD-10-CM | POA: Diagnosis not present

## 2022-12-05 DIAGNOSIS — J069 Acute upper respiratory infection, unspecified: Secondary | ICD-10-CM | POA: Insufficient documentation

## 2022-12-05 DIAGNOSIS — B349 Viral infection, unspecified: Secondary | ICD-10-CM | POA: Insufficient documentation

## 2022-12-05 LAB — POCT URINALYSIS DIP (MANUAL ENTRY)
Bilirubin, UA: NEGATIVE
Blood, UA: NEGATIVE
Glucose, UA: NEGATIVE mg/dL
Ketones, POC UA: NEGATIVE mg/dL
Leukocytes, UA: NEGATIVE
Nitrite, UA: NEGATIVE
Protein Ur, POC: NEGATIVE mg/dL
Spec Grav, UA: 1.01 (ref 1.010–1.025)
Urobilinogen, UA: 0.2 U/dL
pH, UA: 5 (ref 5.0–8.0)

## 2022-12-05 MED ORDER — LIDOCAINE 4 % EX PTCH: 1.0000 | MEDICATED_PATCH | CUTANEOUS | 0 refills | Status: DC

## 2022-12-05 MED ORDER — BENZONATATE 100 MG PO CAPS: 100.0000 mg | ORAL_CAPSULE | Freq: Three times a day (TID) | ORAL | 0 refills | Status: DC | PRN

## 2022-12-05 NOTE — ED Provider Notes (Signed)
[facility]    CSN: [id number] Arrival date & time: 12/05/22  1551      History   Chief Complaint Chief Complaint  Patient presents with   Diarrhea   Generalized Body Aches    HPI [name] is a 72 y.o. female.    Diarrhea  Here for cough, nasal congestion, diarrhea and myalgia.  She is specifically hurting in her bilateral upper posterior shoulders and knees and lower back.  Symptoms began about 4 days ago.  No nausea or vomiting, but she has had diarrhea about 3 or 4 times a day.  No fever or chills  Past medical history significant for hypertension  She is having some dysuria, but no dysuria.  Past Medical History:  Diagnosis Date   Abnormal EKG 2012   hospitalized for T wave inversion in lateral leads with MSK chest pains, normal ECHO, negative trops, no cardiology consult. recent EKG 7/15 stable T wave inversions.    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   Arthritis 2000   Arthritis 2011   Bilateral leg pain 06/14/2015   Clotting disorder (HCC)    blood clot kidney 2017   Heart murmur    Hyperlipidemia 2011   Hypertension 2011   Left elbow pain 03/31/2020   Meniscus tear 2000   L KNEE   Pelvic pain 10/07/2018   Renal infarct (HCC) 2017   blood clot in the kidney    Patient Active Problem List   Diagnosis Date Noted   Acute cough 08/10/2022   Leukocytosis 08/10/2022   Viral URI 04/20/2022   Scoliosis of lumbar spine 02/28/2022   Candida glabrata infection 02/28/2022   Daytime somnolence 01/12/2021   Atypical chest pain 12/13/2020   Abnormal cardiac CT angiography 12/13/2020   Contact dermatitis 01/14/2019   Vaginal itching 12/05/2018   Leg edema 08/28/2018   Thickened endometrium    Umbilical hernia without obstruction and without gangrene 01/01/2018   Osteopenia 09/05/2017   Closed nondisplaced spiral fracture of shaft of right tibia 03/28/2017   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/30/2015   HLD  (hyperlipidemia)    Aortic atherosclerosis (HCC)    Back pain 07/08/2015   Scotoma of blind spot area in visual field 06/14/2015   Seasonal allergies 03/09/2015   Flank pain 03/09/2015   Hypopigmentation 03/09/2015   Unilateral primary osteoarthritis, left knee 07/08/2014   DJD (degenerative joint disease), lumbar 07/08/2014   Osteoarthritis of both knees 07/08/2014   Urinary frequency 06/11/2014   Allergic rhinoconjunctivitis of both eyes 06/11/2014   Nearsightedness 04/27/2014   Abnormal EKG    Hypertension 12/11/2013    Past Surgical History:  Procedure Laterality Date   ABDOMINAL SURGERY  2010   for bowel blockage   COLON SURGERY  2010   bowel blockage   COLONOSCOPY  09/2014   HYSTEROSCOPY WITH D & C N/A 05/08/2018   Procedure: DILATATION AND CURETTAGE /HYSTEROSCOPY;  Surgeon: Catalina Antigua, MD;  Location: [facility];  Service: Gynecology;  Laterality: N/A;   KNEE ARTHROSCOPY Left 09/25/2013   Procedure: LEFT KNEE ARTHROSCOPY WITH PARTIAL MEDIAL AND LATERAL  MENISCECTOMY/DEBRIDEMENT/MICRO FRACTURE MEDIAL FEMORAL CONDYLE, REMOVAL OF OSTEOCONDRAL FRAGMENT;  Surgeon: Javier Docker, MD;  Location: [facility];  Service: Orthopedics;  Laterality: Left;   KNEE SURGERY Right 1999   LEFT HEART CATH AND CORONARY ANGIOGRAPHY N/A 12/13/2020   Procedure: LEFT HEART CATH AND CORONARY ANGIOGRAPHY;  Surgeon: Yvonne Kendall, MD;  [facility]: [facility];  Service: Cardiovascular;  Laterality: N/A;   POLYPECTOMY     TEE WITHOUT CARDIOVERSION N/A 10/04/2015   Procedure: TRANSESOPHAGEAL ECHOCARDIOGRAM (TEE);  Surgeon: Quintella Reichert, MD;  Location: [facility];  Service: Cardiovascular;  Laterality: N/A;   TUBAL LIGATION  1978    OB History     Gravida  2   Para  2   Term      Preterm      AB      Living  2      SAB      IAB      Ectopic      Multiple      Live Births               Home Medications    Prior to Admission medications    Medication Sig Start Date End Date Taking? Authorizing Provider  benzonatate (TESSALON) 100 MG capsule Take 1 capsule (100 mg total) by mouth 3 (three) times daily as needed for cough. 12/05/22  Yes Zenia Resides, MD  amLODipine (NORVASC) 5 MG tablet TAKE 1 TABLET(5 MG) BY MOUTH DAILY 12/07/21   Jerre Simon, MD  aspirin EC 81 MG tablet Take 1 tablet (81 mg total) daily by mouth. 01/31/17   Loletta Specter, PA-C  atorvastatin (LIPITOR) 80 MG tablet TAKE 1 TABLET(80 MG) BY MOUTH DAILY AT 6 PM 09/04/22   Little Ishikawa, MD  Capsaicin-Menthol-Methyl Sal (CAPSAICIN-METHYL SAL-MENTHOL) 0.025-1-12 % CREA Apply 1 Tube topically daily. 05/05/22   Jerre Simon, MD  carvedilol (COREG) 12.5 MG tablet TAKE 1 TABLET(12.5 MG) BY MOUTH TWICE DAILY WITH A MEAL 10/05/22   Jerre Simon, MD  ciclopirox [facility]) 8 % solution Apply topically at bedtime. Apply over nail and surrounding skin. Apply daily over previous coat. After seven (7) days, may remove with alcohol and continue cycle. 07/13/22   Candelaria Stagers, DPM  diclofenac Sodium (VOLTAREN) 1 % GEL APPLY 2 GRAMS TOPICALLY TO THE AFFECTED AREA FOUR TIMES DAILY 11/03/22   Jerre Simon, MD  ezetimibe (ZETIA) 10 MG tablet Take by mouth. 10/26/21   [provider]  fluticasone (FLONASE) 50 MCG/ACT nasal spray SHAKE LIQUID AND USE 2 SPRAYS IN EACH NOSTRIL TWICE DAILY 07/07/22   Jerre Simon, MD  furosemide (LASIX) 40 MG tablet Take 1 tablet (40 mg total) by mouth daily. 09/14/22 09/14/23  Jerre Simon, MD  gabapentin (NEURONTIN) 300 MG capsule Take 1 capsule (300 mg total) by mouth 2 (two) times daily. 08/03/22 11/01/22  Jerre Simon, MD  hydrocortisone 2.5 % ointment Apply topically 2 (two) times daily. 08/15/22   Maury Dus, MD  isosorbide mononitrate (IMDUR) 60 MG 24 hr tablet TAKE 1 TABLET(60 MG) BY MOUTH DAILY 09/04/22   Little Ishikawa, MD  lidocaine (HM LIDOCAINE PATCH) 4 % Place 1 patch onto the skin daily. 12/05/22   Zenia Resides,  MD  losartan (COZAAR) 100 MG tablet TAKE 1 TABLET(100 MG) BY MOUTH DAILY 08/07/22   Jerre Simon, MD  Multiple Vitamin (MULITIVITAMIN WITH MINERALS) TABS Take 1 tablet by mouth daily.    [provider]  nitroGLYCERIN (NITROSTAT) 0.4 MG SL tablet PLACE 1 TABLET UNDER THE TONGUE EVERY 5 MINUTES AS NEEDED. 07/06/22   Little Ishikawa, MD  potassium chloride SA (KLOR-CON M) 20 MEQ tablet TAKE 1 TABLET(20 MEQ) BY MOUTH TWICE DAILY 10/09/22   Jerre Simon, MD  traMADol (ULTRAM) 50 MG tablet Take 1 tablet (50 mg total) by mouth every 8 (eight) hours as needed for up  to 30 doses. 10/05/22   Jerre Simon, MD    Family History Family History  Problem Relation Age of Onset   Hypertension Mother    Heart disease Mother        has a pacemaker    Alzheimer's disease Mother    Hyperlipidemia Mother    Osteoporosis Mother    Heart failure Father    Hypertension Father    Alzheimer's disease Father    Hypertension Sister    Breast cancer Sister    Hypertension Sister    Gout Sister    Heart disease Son    Alcohol abuse Son    Pancreatitis Son    Hypertension Son    Cancer Neg Hx    Colon cancer Neg Hx    Esophageal cancer Neg Hx    Stomach cancer Neg Hx    Rectal cancer Neg Hx     Social History Social History   Tobacco Use   Smoking status: Former    Current packs/day: 0.00    Types: Cigarettes    Quit date: 07/29/2010    Years since quitting: 12.3    Passive exposure: Past   Smokeless tobacco: Never  Vaping Use   Vaping status: Never Used  Substance Use Topics   Alcohol use: Not Currently   Drug use: Never     Allergies   Shrimp [shellfish allergy], Latex, and Penicillins   Review of Systems Review of Systems  Gastrointestinal:  Positive for diarrhea.     Physical Exam Triage Vital Signs ED Triage Vitals [12/05/22 1649]  Encounter Vitals Group     BP (!) 191/50     Systolic BP Percentile      Diastolic BP Percentile      Pulse Rate 75     Resp 18      Temp 98.4 F (36.9 C)     Temp Source Oral     SpO2 97 %     Weight      Height      Head Circumference      Peak Flow      Pain Score 10     Pain Loc      Pain Education      Exclude from Growth Chart    No data found.  Updated Vital Signs BP (!) 191/50 (BP Location: Left Arm)   Pulse 75   Temp 98.4 F (36.9 C) (Oral)   Resp 18   SpO2 97%   Visual Acuity Right Eye Distance:   Left Eye Distance:   Bilateral Distance:    Right Eye Near:   Left Eye Near:    Bilateral Near:     Physical Exam Vitals reviewed.  Constitutional:      General: She is not in acute distress.    Appearance: She is not toxic-appearing.  HENT:     Right Ear: Tympanic membrane and ear canal normal.     Left Ear: Tympanic membrane and ear canal normal.     Nose: Congestion present.     Mouth/Throat:     Mouth: Mucous membranes are moist.     Pharynx: No oropharyngeal exudate or posterior oropharyngeal erythema.  Eyes:     Extraocular Movements: Extraocular movements intact.     Conjunctiva/sclera: Conjunctivae normal.     Pupils: Pupils are equal, round, and reactive to light.  Cardiovascular:     Rate and Rhythm: Normal rate and regular rhythm.     Heart sounds: No murmur heard.  Pulmonary:     Effort: Pulmonary effort is normal. No respiratory distress.     Breath sounds: No stridor. No wheezing, rhonchi or rales.  Abdominal:     Palpations: Abdomen is soft.     Tenderness: There is no abdominal tenderness.  Musculoskeletal:     Cervical back: Neck supple.  Lymphadenopathy:     Cervical: No cervical adenopathy.  Skin:    Capillary Refill: Capillary refill takes less than 2 seconds.     Coloration: Skin is not jaundiced or pale.  Neurological:     General: No focal deficit present.     Mental Status: She is alert and oriented to person, place, and time.  Psychiatric:        Behavior: Behavior normal.      UC Treatments / Results  Labs (all labs ordered are listed, but  only abnormal results are displayed) Labs Reviewed  SARS CORONAVIRUS 2 (TAT 6-24 HRS)  POCT URINALYSIS DIP (MANUAL ENTRY)    EKG   Radiology No results found.  Procedures Procedures (including critical care time)  Medications Ordered in UC Medications - No data to display  Initial Impression / Assessment and Plan / UC Course  I have reviewed the triage vital signs and the nursing notes.  Pertinent labs & imaging results that were available during my care of the patient were reviewed by me and considered in my medical decision making (see chart for details).     Urinalysis is negative.  COVID swab is done.  If positive we will notify her.  Her last EGFR was 61, but all the other EGFR measurements done in the last 2 years have been in the 1s.  If she is positive for COVID, she should have Paxlovid prescribed with renal dosing.  Tessalon Perles are sent in for cough, and I have recommended Imodium for diarrhea. Final Clinical Impressions(s) / UC Diagnoses   Final diagnoses:  Viral illness  Viral URI     Discharge Instructions      The urinalysis is clear and does not show any signs of urine infection.  On prior blood work, your kidney function is very mildly reduced.  It is supposed to be greater than 60, and years is in the mid upper 76s.  It is still best not to take ibuprofen or Aleve at home.  Take Tylenol as needed for aches  I have also send in a prescription for lidocaine patches to apply to painful muscles.   You have been swabbed for COVID, and the test will result in the next 24 hours. Our staff will call you if positive. If the COVID test is positive, you should quarantine until you are fever free for 24 hours and you are starting to feel better, and then take added precautions for the next 5 days, such as physical distancing/wearing a mask and good hand hygiene/washing.   You can take Imodium over-the-counter for diarrhea.    ED Prescriptions      Medication Sig Dispense Auth. Provider   benzonatate (TESSALON) 100 MG capsule Take 1 capsule (100 mg total) by mouth 3 (three) times daily as needed for cough. 21 capsule Randell Teare, Janace Aris, MD   lidocaine (HM LIDOCAINE PATCH) 4 % Place 1 patch onto the skin daily. 5 patch Guage Efferson, Janace Aris, MD      PDMP not reviewed this encounter.   Zenia Resides, MD 12/05/22 [id number]

## 2022-12-05 NOTE — ED Triage Notes (Signed)
Pt c/o diarrhea, nausea, urinary frequency, lt flank pain, cough, and body aches x4 days. States took ibuprofen with no relief.

## 2022-12-05 NOTE — Discharge Instructions (Addendum)
The urinalysis is clear and does not show any signs of urine infection.  On prior blood work, your kidney function is very mildly reduced.  It is supposed to be greater than 60, and years is in the mid upper 22s.  It is still best not to take ibuprofen or Aleve at home.  Take Tylenol as needed for aches  I have also send in a prescription for lidocaine patches to apply to painful muscles.   You have been swabbed for COVID, and the test will result in the next 24 hours. Our staff will call you if positive. If the COVID test is positive, you should quarantine until you are fever free for 24 hours and you are starting to feel better, and then take added precautions for the next 5 days, such as physical distancing/wearing a mask and good hand hygiene/washing.   You can take Imodium over-the-counter for diarrhea.

## 2022-12-06 LAB — SARS CORONAVIRUS 2 (TAT 6-24 HRS): SARS Coronavirus 2: NEGATIVE

## 2022-12-08 ENCOUNTER — Other Ambulatory Visit: Payer: Self-pay | Admitting: Student

## 2022-12-08 DIAGNOSIS — M1712 Unilateral primary osteoarthritis, left knee: Secondary | ICD-10-CM

## 2022-12-08 DIAGNOSIS — E876 Hypokalemia: Secondary | ICD-10-CM

## 2022-12-12 ENCOUNTER — Telehealth: Payer: Self-pay

## 2022-12-12 DIAGNOSIS — G8929 Other chronic pain: Secondary | ICD-10-CM | POA: Diagnosis not present

## 2022-12-12 DIAGNOSIS — R109 Unspecified abdominal pain: Secondary | ICD-10-CM | POA: Diagnosis not present

## 2022-12-12 DIAGNOSIS — M25561 Pain in right knee: Secondary | ICD-10-CM | POA: Diagnosis not present

## 2022-12-12 DIAGNOSIS — R1030 Lower abdominal pain, unspecified: Secondary | ICD-10-CM | POA: Diagnosis not present

## 2022-12-12 DIAGNOSIS — M25562 Pain in left knee: Secondary | ICD-10-CM | POA: Diagnosis not present

## 2022-12-12 DIAGNOSIS — S335XXA Sprain of ligaments of lumbar spine, initial encounter: Secondary | ICD-10-CM | POA: Diagnosis not present

## 2022-12-12 DIAGNOSIS — X58XXXA Exposure to other specified factors, initial encounter: Secondary | ICD-10-CM | POA: Diagnosis not present

## 2022-12-12 DIAGNOSIS — M170 Bilateral primary osteoarthritis of knee: Secondary | ICD-10-CM | POA: Diagnosis not present

## 2022-12-12 NOTE — Telephone Encounter (Signed)
Patient calls nurse line in regards to Voltaren Gel.   She reports difficulty picking this up at [facility].   Spoke with [facility] and medication is now OTC. Therefore, insurance will not pay for it. She reports she informed the patient of this yesterday.   I called Judia back. She states "I want something my insurance will cover."   Advised will forward to PCP.

## 2022-12-14 ENCOUNTER — Encounter: Payer: Self-pay | Admitting: Student

## 2022-12-15 ENCOUNTER — Ambulatory Visit (INDEPENDENT_AMBULATORY_CARE_PROVIDER_SITE_OTHER): Payer: 59 | Admitting: Student

## 2022-12-15 VITALS — BP 136/68 | HR 62 | Wt 201.0 lb

## 2022-12-15 DIAGNOSIS — M545 Low back pain, unspecified: Secondary | ICD-10-CM

## 2022-12-15 MED ORDER — METHYLPREDNISOLONE ACETATE 40 MG/ML IJ SUSP
20.0000 mg | Freq: Once | INTRAMUSCULAR | Status: AC
Start: 2022-12-15 — End: 2022-12-15
  Administered 2022-12-15: 20 mg via INTRALESIONAL

## 2022-12-15 NOTE — Patient Instructions (Signed)
Ms Garinger,  It is a joy to meet you. I think this pain is from some inflammation where your back muscles meet your hip bone. I did a trigger point injection with some numbing medicine and steroid. Hopefully this will help. In the future, the key to preventing this from coming back is making your back muscles strong. You can try some "superman" exercises (10 times, 3 times daily) to start. If this gets worse rather than better, send Dr. Elliot Gurney a message or call us for another appointmetn. We could think about an X-ray at that time.  Eliezer Mccoy, MD

## 2022-12-15 NOTE — Telephone Encounter (Signed)
Called patient to inform her that the insurance wouldn't cover the voltaren gel. She stated that she would like an alternative if possible, that is covered by insurance.  [name], CMA

## 2022-12-17 NOTE — Assessment & Plan Note (Addendum)
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 Given the hyperlocalized nature of her pain, she is amenable to trial of a trigger point injection today.  A trigger point injection was performed at the site of maximal tenderness using 1 mL 1% plain Lidocaine and 20mg  methylprednisolone. I made several throws with the needle at the point of maximal tenderness to break up any tissue spasm. This was tolerated well.

## 2022-12-17 NOTE — Progress Notes (Signed)
    SUBJECTIVE:   CHIEF COMPLAINT / HPI:   Right Low Back Pain Present for about two weeks now. Pain is located on the right side "on" her "hip bone" (iliac crest). No injury to the area or recent changes in her activity level. Was seen at [facility] and given tizanidine, naproxen and lidocaine patches. These have been minimally helpful. The thing she finds most helpful is walking while leaning forward and supporting herself. No fevers. She does not have any history of cancer.    OBJECTIVE:   BP 136/68   Pulse 62   Wt 201 lb (91.2 kg)   SpO2 99%   BMI 33.45 kg/m   Gen: Uncomfortable appearing but non-toxic, A&O Gait: Antalgic gait, leaning forward  MSK: Low back without deformity. There is no midline tenderness, deformity or paraspinal tenderness. There is an area of point tenderness about 7 cm lateral to the SI joint on the right side at the top of the iliac crest. This is also where she localizes her pain when asked. Pain is exacerbated when she is asked to stand straight. She has a negative SLR, FABER and FADIR exam.   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 Given the hyperlocalized nature of her pain, she is amenable to trial of a trigger point injection today.  A trigger point injection was performed at the site of maximal tenderness using 1 mL 1% plain Lidocaine and 20mg  methylprednisolone. I made several throws with the needle at the point of maximal tenderness to break up any tissue spasm. This was tolerated well.     Eliezer Mccoy, MD [facility]

## 2022-12-25 ENCOUNTER — Encounter: Payer: Self-pay | Admitting: Student

## 2022-12-25 ENCOUNTER — Ambulatory Visit (INDEPENDENT_AMBULATORY_CARE_PROVIDER_SITE_OTHER): Payer: 59 | Admitting: Student

## 2022-12-25 VITALS — BP 130/70 | HR 62 | Ht 65.0 in | Wt 199.0 lb

## 2022-12-25 DIAGNOSIS — I10 Essential (primary) hypertension: Secondary | ICD-10-CM

## 2022-12-25 DIAGNOSIS — L218 Other seborrheic dermatitis: Secondary | ICD-10-CM | POA: Diagnosis not present

## 2022-12-25 DIAGNOSIS — M545 Low back pain, unspecified: Secondary | ICD-10-CM

## 2022-12-25 DIAGNOSIS — L819 Disorder of pigmentation, unspecified: Secondary | ICD-10-CM | POA: Diagnosis not present

## 2022-12-25 DIAGNOSIS — M25551 Pain in right hip: Secondary | ICD-10-CM | POA: Diagnosis not present

## 2022-12-25 MED ORDER — NAPROXEN 500 MG PO TABS
500.0000 mg | ORAL_TABLET | Freq: Two times a day (BID) | ORAL | 0 refills | Status: DC
Start: 2022-12-25 — End: 2023-04-12

## 2022-12-25 NOTE — Assessment & Plan Note (Signed)
BP today was elevated x 1 and improved on recheck.  Patient reported she had not taken her blood pressure medication this morning and does not check BPs at home.  Suspected elevated BP could be attributed to her ongoing pain. -Advised patient to check daily blood pressure and keep a blood pressure logs -Encourage compliance to medication -Will follow-up in 2 weeks to reassess blood pressure logs -BMP ordered

## 2022-12-25 NOTE — Patient Instructions (Addendum)
It was wonderful to see you today. Thank you for allowing me to be a part of your care. Below is a short summary of what we discussed at your visit today:  I suspect your right hip pain could be multiple things including arthritis or possible muscle strain.  I recommend making sure you take the naproxen that was prescribed to you at urgent care.  Also you have a referral to orthopedics.  You can in schedule an appointment with Ortho care who you have seen in the past or call the number below to schedule an appointment to be seen about your joint pain.  [facility] Address: [address], [place], [place] [address] Phone: [phone]  Your blood pressure today was elevated please make sure to take your blood pressure medications as scheduled.  He check your blood pressure daily, keep a blood pressure log and follow-up in 2 weeks lets recheck your blood pressure.  Obtaining labs to check your kidney function and your electrolytes.   If you have any questions or concerns, please do not hesitate to contact us via phone or MyChart message.   Jerre Simon, MD [facility]

## 2022-12-25 NOTE — Progress Notes (Signed)
    SUBJECTIVE:   CHIEF COMPLAINT / HPI:  Patient is a 72 year old female presenting with right-sided hip/loweback pain. Was recently seen by Dr. Marisue Humble who performed a trigger point injection which patient at a time tolerated appropriately.Today patient has returned due to continued pain and reported no improvements with the trigger point injections. She is currently not taking anything for pain other than Gabapentin and was supposed to take naprosyn but was concerned it would affect her BP.  Short course of Tramadol which she was given in the past for knee pain improved pain in the past and patient would like to know if she can get this for this pain.  Denies any shooting pain down her legs.  No incontinence, LE or saddle paresthesia.  It is worse with certain movements and improves with leaning forward while walking.   PERTINENT  PMH / PSH: Reviewed   OBJECTIVE:   BP 130/70   Pulse 62   Ht 5\' 5"  (1.651 m)   Wt 199 lb (90.3 kg)   SpO2 99%   BMI 33.12 kg/m    Physical Exam General: Alert, well appearing, NAD  Hip Exam No deformity. FROM with 5/5 strength except 4/5 hip abduction with pain. Tenderness to palpation within gluteal musculature.  No trochanter, SI joint, piriformis tenderness. NVI distally. Negative logroll. Negative faber, fadir,    ASSESSMENT/PLAN:   Hypertension BP today was elevated x 1 and improved on recheck.  Patient reported she had not taken her blood pressure medication this morning and does not check BPs at home.  Suspected elevated BP could be attributed to her ongoing pain. -Advised patient to check daily blood pressure and keep a blood pressure logs -Encourage compliance to medication -Will follow-up in 2 weeks to reassess blood pressure logs -BMP ordered  Back pain Right-sided lower back/hip pain.  No improvement with previous trigger point injection.  Pain seems unchanged and unclear cause of pain at this time. Suspecting tendinopathy, piriformis  syndrome, bursitis.  However we will treat patient with NSAID and obtain imaging of the hip. -Ordered x-ray of the right hip/pelvics -Rx naproxen 500 mg twice daily for 14 days -Provided patient with contact for her orthopedic to schedule an appointment for reevaluation.     Jerre Simon, MD [facility]

## 2022-12-25 NOTE — Assessment & Plan Note (Signed)
Right-sided lower back/hip pain.  No improvement with previous trigger point injection.  Pain seems unchanged and unclear cause of pain at this time. Suspecting tendinopathy, piriformis syndrome, bursitis.  However we will treat patient with NSAID and obtain imaging of the hip. -Ordered x-ray of the right hip/pelvics -Rx naproxen 500 mg twice daily for 14 days -Provided patient with contact for her orthopedic to schedule an appointment for reevaluation.

## 2022-12-26 LAB — BASIC METABOLIC PANEL WITH GFR
BUN/Creatinine Ratio: 13 (ref 12–28)
BUN: 14 mg/dL (ref 8–27)
CO2: 22 mmol/L (ref 20–29)
Calcium: 9.2 mg/dL (ref 8.7–10.3)
Chloride: 106 mmol/L (ref 96–106)
Creatinine, Ser: 1.07 mg/dL — ABNORMAL HIGH (ref 0.57–1.00)
Glucose: 95 mg/dL (ref 70–99)
Potassium: 4.2 mmol/L (ref 3.5–5.2)
Sodium: 147 mmol/L — ABNORMAL HIGH (ref 134–144)
eGFR: 55 mL/min/1.73 — ABNORMAL LOW (ref >59)

## 2022-12-29 ENCOUNTER — Telehealth: Payer: Self-pay

## 2022-12-29 NOTE — Telephone Encounter (Signed)
Patient calls nurse line regarding continued diarrhea.   She reports that this has been going on for over one week. Reports 3-4 episodes of loose stool per day. Denies blood in stool.   Denies abdominal pain, nausea or vomiting. Denies recent use of antibiotics.   She reports that she has been taking Mylanta since 12/25/22.   She is asking if there is an alternative medication she could try.    Supportive measures discussed.   Forwarding to Dr. Elliot Gurney for further advisement.   Veronda Prude, RN

## 2023-01-02 DIAGNOSIS — R1032 Left lower quadrant pain: Secondary | ICD-10-CM | POA: Diagnosis not present

## 2023-01-02 DIAGNOSIS — M170 Bilateral primary osteoarthritis of knee: Secondary | ICD-10-CM | POA: Diagnosis not present

## 2023-01-02 DIAGNOSIS — M4150 Other secondary scoliosis, site unspecified: Secondary | ICD-10-CM | POA: Diagnosis not present

## 2023-01-02 DIAGNOSIS — R1031 Right lower quadrant pain: Secondary | ICD-10-CM | POA: Diagnosis not present

## 2023-01-02 DIAGNOSIS — R109 Unspecified abdominal pain: Secondary | ICD-10-CM | POA: Diagnosis not present

## 2023-01-02 DIAGNOSIS — M545 Low back pain, unspecified: Secondary | ICD-10-CM | POA: Diagnosis not present

## 2023-01-02 DIAGNOSIS — G8929 Other chronic pain: Secondary | ICD-10-CM | POA: Diagnosis not present

## 2023-01-02 DIAGNOSIS — M25562 Pain in left knee: Secondary | ICD-10-CM | POA: Diagnosis not present

## 2023-01-02 DIAGNOSIS — M25551 Pain in right hip: Secondary | ICD-10-CM | POA: Diagnosis not present

## 2023-01-02 DIAGNOSIS — M4317 Spondylolisthesis, lumbosacral region: Secondary | ICD-10-CM | POA: Diagnosis not present

## 2023-01-02 DIAGNOSIS — M47816 Spondylosis without myelopathy or radiculopathy, lumbar region: Secondary | ICD-10-CM | POA: Diagnosis not present

## 2023-01-02 DIAGNOSIS — M25561 Pain in right knee: Secondary | ICD-10-CM | POA: Diagnosis not present

## 2023-01-05 ENCOUNTER — Ambulatory Visit: Payer: 59 | Admitting: Family Medicine

## 2023-01-05 ENCOUNTER — Other Ambulatory Visit: Payer: Self-pay

## 2023-01-05 ENCOUNTER — Ambulatory Visit: Payer: 59 | Admitting: Student

## 2023-01-05 ENCOUNTER — Encounter: Payer: Self-pay | Admitting: Family Medicine

## 2023-01-05 VITALS — BP 192/83 | HR 59 | Ht 65.0 in | Wt 196.4 lb

## 2023-01-05 DIAGNOSIS — I10 Essential (primary) hypertension: Secondary | ICD-10-CM | POA: Diagnosis not present

## 2023-01-05 DIAGNOSIS — Z23 Encounter for immunization: Secondary | ICD-10-CM | POA: Diagnosis not present

## 2023-01-05 DIAGNOSIS — R109 Unspecified abdominal pain: Secondary | ICD-10-CM | POA: Diagnosis not present

## 2023-01-05 DIAGNOSIS — M170 Bilateral primary osteoarthritis of knee: Secondary | ICD-10-CM

## 2023-01-05 HISTORY — DX: Unspecified abdominal pain: R10.9

## 2023-01-05 MED ORDER — AMLODIPINE BESYLATE 5 MG PO TABS: 5.0000 mg | ORAL_TABLET | Freq: Every day | ORAL | 0 refills | Status: DC

## 2023-01-05 NOTE — Assessment & Plan Note (Signed)
Ongoing knee pain, R>L. Naproxen did not help, tramodol helped in the past. Risks of using tramadol in geriatric patient outweighs benefits. Recommend alternating ibuprofen and tylenol and contacting ortho office for follow up appointment.

## 2023-01-05 NOTE — Assessment & Plan Note (Signed)
Pt with ongoing stomach discomfort and gurgling after recent bout of diarrhea. Was unable to fully characterize during visit today. Recommended follow up with PCP to discuss further.

## 2023-01-05 NOTE — Assessment & Plan Note (Addendum)
BP: (!) 192/83 today. Poorly controlled. Goal of <130/80. Believe knee pain and white coat HTN playing roles, although pressures also elevated at home (150s/70s). Adding amlodipine 5mg  daily today and checking BMP as Cr was slightly elevated at prior check. Follow up in 2 weeks. Consider referral to Dr Raymondo Band moving forward if BP still poorly controlled.  Medication regimen: amlodipine 5mg  daily, losartan 100mg  daily, imdur 60mg  daily, carvedilol 12.5mg  daily

## 2023-01-05 NOTE — Patient Instructions (Addendum)
Thank you for coming in today!  Things we discussed today: For your blood pressure, please start taking amlodipine in addition to your other medications. We would like to see you again in 2 weeks to check your blood pressure again. We can also consider scheduling you to see our pharmacist, Dr Raymondo Band, to help with your blood pressure. 2. For your knee pain, please reach out to your orthopedic doctor to schedule an appointment. You can alternate tylenol and ibuprofen in the meantime. 3. For your ongoing stomach issues, please discuss this further at your next appointment so we can get more details about what is going on.  Please return to clinic in 2 weeks for blood pressure check and to discuss your stomach problems.   Have a great day!

## 2023-01-05 NOTE — Progress Notes (Cosign Needed Addendum)
    SUBJECTIVE:   CHIEF COMPLAINT / HPI:   HTN -taking meds daily (carvedilol 12.5mg  BID, imdur 60mg  daily, losartan 100mg  daily) -checking pressures at home, 150s/70 -no chest pain or blurred vision -no issues with dizziness, lightheadedness  -was taking amlodipine 10mg  in the past but had issues with leg swelling  Knee pain -chronic issue -has had BL knee replacements about 15 years ago -has been taking naproxen for the past 3 weeks, this has not helped -took tramadol prior to this, states this helped more than the naproxen -has not been taking tylenol, was concerned about taking this with the naproxen -has an orthopedist who she has seen in the past  Hip pain -hip pain is much improved, is supposed to go see a spine specialist  Stomach discomfort/gas -reports she had diarrhea that started about 3 weeks ago -diarrhea has subsided but has had ongoing bloating and stomach gurgling -has not tried any medication for this  PERTINENT  PMH / PSH: lumbar DJD, HLD, renal infarct, aortic valve vegetation  OBJECTIVE:   BP (!) 192/83   Pulse (!) 59   Ht 5\' 5"  (1.651 m)   Wt 196 lb 6.4 oz (89.1 kg)   SpO2 100%   BMI 32.68 kg/m   Initial BP 210/85  Physical exam Gen: older female sitting in exam room in NAD, pleasant and conversant CV: RRR, normal S1/S2 Pulm: CTAB, normal WOB on RA Ext: no swelling, erythema, tenderness, or warmth to palpation of bilateral knees  ASSESSMENT/PLAN:   Hypertension BP: (!) 192/83 today. Poorly controlled. Goal of <130/80. Believe knee pain and white coat HTN playing roles, although pressures also elevated at home (150s/70s). Adding amlodipine 5mg  daily today and checking BMP as Cr was slightly elevated at prior check. Follow up in 2 weeks. Consider referral to Dr Raymondo Band moving forward if BP still poorly controlled.  Medication regimen: amlodipine 5mg  daily, losartan 100mg  daily, imdur 60mg  daily, carvedilol 12.5mg  daily  Osteoarthritis of both  knees Ongoing knee pain, R>L. Naproxen did not help, tramodol helped in the past. Risks of using tramadol in geriatric patient outweighs benefits. Recommend alternating ibuprofen and tylenol and contacting ortho office for follow up appointment.  Stomach discomfort Pt with ongoing stomach discomfort and gurgling after recent bout of diarrhea. Was unable to fully characterize during visit today. Recommended follow up with PCP to discuss further.      Lorayne Bender, MD [facility]

## 2023-01-06 LAB — BASIC METABOLIC PANEL
BUN/Creatinine Ratio: 21 (ref 12–28)
BUN: 27 mg/dL (ref 8–27)
CO2: 24 mmol/L (ref 20–29)
Calcium: 9.3 mg/dL (ref 8.7–10.3)
Chloride: 104 mmol/L (ref 96–106)
Creatinine, Ser: 1.28 mg/dL — ABNORMAL HIGH (ref 0.57–1.00)
Glucose: 83 mg/dL (ref 70–99)
Potassium: 4.6 mmol/L (ref 3.5–5.2)
Sodium: 141 mmol/L (ref 134–144)
eGFR: 44 mL/min/{1.73_m2} — ABNORMAL LOW (ref >59)

## 2023-01-08 ENCOUNTER — Telehealth: Payer: Self-pay

## 2023-01-08 NOTE — Telephone Encounter (Signed)
Patient LVM on nurse line returning a phone call.   She reports she received a message over the weekend in regards to results. However, she reports she has some questions.   I attempted to call patient back to discuss results, however no answer.

## 2023-01-09 NOTE — Telephone Encounter (Signed)
Called patient to discuss lab results and encouraged follow-up about stomach concerns.  Reviewed that patient's creatinine was slightly elevated, advised patient to avoid NSAIDs such as naproxen and ibuprofen and informed that we will likely repeat a BMP at her next appointment.  Also advised patient to discuss stomach problems at upcoming appointment with her PCP 10/29 as this problem was not fully explored during visit with me last week.  Patient voiced understanding and had no further questions or concerns.  Will send note to PCP about this as well.

## 2023-01-17 ENCOUNTER — Ambulatory Visit (INDEPENDENT_AMBULATORY_CARE_PROVIDER_SITE_OTHER): Payer: 59 | Admitting: Podiatry

## 2023-01-17 ENCOUNTER — Encounter: Payer: Self-pay | Admitting: Podiatry

## 2023-01-17 VITALS — Ht 65.0 in | Wt 194.6 lb

## 2023-01-17 DIAGNOSIS — B351 Tinea unguium: Secondary | ICD-10-CM | POA: Diagnosis not present

## 2023-01-17 DIAGNOSIS — M79674 Pain in right toe(s): Secondary | ICD-10-CM

## 2023-01-17 DIAGNOSIS — M79675 Pain in left toe(s): Secondary | ICD-10-CM | POA: Diagnosis not present

## 2023-01-17 NOTE — Progress Notes (Signed)

## 2023-01-19 ENCOUNTER — Telehealth: Payer: Self-pay

## 2023-01-19 NOTE — Telephone Encounter (Signed)
Patient LVM on nurse line requesting a call back from a nurse.   I attempted to call patient back, however no answer.   I will be happy to speak with her if/when she calls back.

## 2023-01-22 ENCOUNTER — Ambulatory Visit: Payer: 59 | Admitting: Family Medicine

## 2023-01-23 ENCOUNTER — Ambulatory Visit: Payer: 59 | Admitting: Student

## 2023-01-26 ENCOUNTER — Ambulatory Visit: Payer: 59 | Attending: Cardiology | Admitting: Cardiology

## 2023-01-26 ENCOUNTER — Encounter: Payer: Self-pay | Admitting: Cardiology

## 2023-01-26 VITALS — BP 142/72 | HR 62 | Ht 65.0 in | Wt 194.0 lb

## 2023-01-26 DIAGNOSIS — Z79899 Other long term (current) drug therapy: Secondary | ICD-10-CM | POA: Diagnosis not present

## 2023-01-26 DIAGNOSIS — I2510 Atherosclerotic heart disease of native coronary artery without angina pectoris: Secondary | ICD-10-CM

## 2023-01-26 DIAGNOSIS — E785 Hyperlipidemia, unspecified: Secondary | ICD-10-CM | POA: Diagnosis not present

## 2023-01-26 DIAGNOSIS — I10 Essential (primary) hypertension: Secondary | ICD-10-CM | POA: Diagnosis not present

## 2023-01-26 DIAGNOSIS — I5032 Chronic diastolic (congestive) heart failure: Secondary | ICD-10-CM

## 2023-01-26 DIAGNOSIS — G4733 Obstructive sleep apnea (adult) (pediatric): Secondary | ICD-10-CM

## 2023-01-26 NOTE — Progress Notes (Signed)
Cardiology Office Note:    Date:  01/26/2023   ID:  [name], DOB 1950/04/15, MRN [id number]  PCP:  Jerre Simon, MD  Cardiologist:  Little Ishikawa, MD  Electrophysiologist:  None   Referring MD: Jerre Simon, MD   Chief Complaint  Patient presents with   Coronary Artery Disease    History of Present Illness:    [name] is a 72 y.o. female with a hx of hypertension, hyperlipidemia, renal infarct, ?aortic valve vegetation who presents for follow-up.  She was referred by Dr. Leveda Anna for evaluation of chest pain, initially seen 11/18/2020.  She was admitted to [facility] from 7/6 through 10/04/2015 with lower abdominal pain.  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  She was started on heparin drip.  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  Blood cultures were negative.  She was started on anticoagulation with warfarin.  Echo 10/01/2015 showed EF 65 to 70%, grade 1 diastolic dysfunction.  She completed 6 months of anticoagulation and was then transitioned to aspirin 81 mg daily.  No further episodes.  Coronary CTA on 11/26/2020 showed moderate CAD in mid and distal RCA, calcium score 936 (97th percentile).  CT FFR suggested total occlusion in mid to distal RCA and indeterminate FFR value in distal LAD that may be due to vessel tapering of small caliber distal vessel stenosis.  Cath 12/13/2020 showed chronic subtotal (99%) occlusion of distal RCA with left-to-right collaterals, mild proximal LAD disease.  Zio patch x14 days 11/15/2021 showed no significant arrhythmias.  Since last clinic visit,  she reports she is doing well.  Denies any chest pain, dyspnea, lightheadedness, syncope, or palpitations.  Did have some LE edema.  Does leg exercises and does exercise class twice per week for an hour.    BP Readings from Last 3 Encounters:  01/26/23 (!) 142/72  01/05/23 (!) 192/83   12/25/22 130/70   Wt Readings from Last 3 Encounters:  01/26/23 194 lb (88 kg)  01/17/23 194 lb 9.6 oz (88.3 kg)  01/05/23 196 lb 6.4 oz (89.1 kg)      Past Medical History:  Diagnosis Date   Abnormal EKG 2012   hospitalized for T wave inversion in lateral leads with MSK chest pains, normal ECHO, negative trops, no cardiology consult. recent EKG 7/15 stable T wave inversions.    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   Arthritis 2000   Arthritis 2011   Bilateral leg pain 06/14/2015   Clotting disorder (HCC)    blood clot kidney 2017   Heart murmur    Hyperlipidemia 2011   Hypertension 2011   Left elbow pain 03/31/2020   Meniscus tear 2000   L KNEE   Pelvic pain 10/07/2018   Renal infarct (HCC) 2017   blood clot in the kidney    Past Surgical History:  Procedure Laterality Date   ABDOMINAL SURGERY  2010   for bowel blockage   COLON SURGERY  2010   bowel blockage   COLONOSCOPY  09/2014   HYSTEROSCOPY WITH D & C N/A 05/08/2018   Procedure: DILATATION AND CURETTAGE /HYSTEROSCOPY;  Surgeon: Catalina Antigua, MD;  Location: [facility];  Service: Gynecology;  Laterality: N/A;   KNEE ARTHROSCOPY Left 09/25/2013   Procedure: LEFT KNEE ARTHROSCOPY WITH PARTIAL MEDIAL AND LATERAL  MENISCECTOMY/DEBRIDEMENT/MICRO FRACTURE MEDIAL FEMORAL CONDYLE, REMOVAL OF OSTEOCONDRAL FRAGMENT;  Surgeon: Javier Docker, MD;  Location: [facility];  Service: Orthopedics;  Laterality: Left;   KNEE SURGERY Right 1999   LEFT HEART CATH AND CORONARY ANGIOGRAPHY N/A 12/13/2020   Procedure: LEFT HEART CATH AND CORONARY ANGIOGRAPHY;  Surgeon: Yvonne Kendall, MD;  Location: [facility];  Service: Cardiovascular;  Laterality: N/A;   POLYPECTOMY     TEE WITHOUT CARDIOVERSION N/A 10/04/2015   Procedure: TRANSESOPHAGEAL ECHOCARDIOGRAM (TEE);  Surgeon: Quintella Reichert, MD;  Location: [facility]COPY;  Service: Cardiovascular;  Laterality: N/A;   TUBAL LIGATION  1978    Current Medications: Current  Meds  Medication Sig   amLODipine (NORVASC) 5 MG tablet Take 1 tablet (5 mg total) by mouth at bedtime.   aspirin EC 81 MG tablet Take 1 tablet (81 mg total) daily by mouth.   atorvastatin (LIPITOR) 80 MG tablet TAKE 1 TABLET(80 MG) BY MOUTH DAILY AT 6 PM   Capsaicin-Menthol-Methyl Sal (CAPSAICIN-METHYL SAL-MENTHOL) 0.025-1-12 % CREA Apply 1 Tube topically daily.   carvedilol (COREG) 12.5 MG tablet TAKE 1 TABLET(12.5 MG) BY MOUTH TWICE DAILY WITH A MEAL   ciclopirox (PENLAC) 8 % solution Apply topically at bedtime. Apply over nail and surrounding skin. Apply daily over previous coat. After seven (7) days, may remove with alcohol and continue cycle.   diclofenac Sodium (VOLTAREN) 1 % GEL APPLY 2 GRAMS TOPICALLY TO THE AFFECTED AREA FOUR TIMES DAILY   ezetimibe (ZETIA) 10 MG tablet Take by mouth.   fluticasone (FLONASE) 50 MCG/ACT nasal spray SHAKE LIQUID AND USE 2 SPRAYS IN EACH NOSTRIL TWICE DAILY   furosemide (LASIX) 40 MG tablet Take 1 tablet (40 mg total) by mouth daily.   hydrocortisone 2.5 % ointment Apply topically 2 (two) times daily.   isosorbide mononitrate (IMDUR) 60 MG 24 hr tablet TAKE 1 TABLET(60 MG) BY MOUTH DAILY   ketoconazole (NIZORAL) 2 % cream Apply 1 Application topically as needed.   ketoconazole (NIZORAL) 2 % shampoo Apply 1 Application topically as needed.   lidocaine (HM LIDOCAINE PATCH) 4 % Place 1 patch onto the skin daily.   losartan (COZAAR) 100 MG tablet TAKE 1 TABLET(100 MG) BY MOUTH DAILY   Multiple Vitamin (MULITIVITAMIN WITH MINERALS) TABS Take 1 tablet by mouth daily.   nitroGLYCERIN (NITROSTAT) 0.4 MG SL tablet PLACE 1 TABLET UNDER THE TONGUE EVERY 5 MINUTES AS NEEDED.   potassium chloride SA (KLOR-CON M) 20 MEQ tablet TAKE 1 TABLET(20 MEQ) BY MOUTH TWICE DAILY   predniSONE (STERAPRED UNI-PAK 21 TAB) 10 MG (21) TBPK tablet Take 10 mg by mouth as needed. follow package directions   tizanidine (ZANAFLEX) 2 MG capsule Take 2 mg by mouth as needed.      Allergies:   Shrimp [shellfish allergy], Latex, and Penicillins   Social History   Socioeconomic History   Marital status: Single    Spouse name: Not on file   Number of children: 2   Years of education: 12   Highest education level: 12th grade  Occupational History   Occupation: Custodian    Employer: [employer]    Comment: RETIRED  Tobacco Use   Smoking status: Former    Current packs/day: 0.00    Types: Cigarettes    Quit date: 07/29/2010    Years since quitting: 12.5    Passive exposure: Past   Smokeless tobacco: Never  Vaping Use   Vaping status: Never Used  Substance and Sexual Activity   Alcohol use: Not Currently   Drug use: Never   Sexual activity: Not Currently    Birth control/protection: Surgical  Comment: 1st intercourse 72 yo-Fewer than 5 partners  Other Topics Concern   Not on file  Social History Narrative   Patient and her one of her sons live together in [place].   Patient has another son who resides in [place], [place].   Never married      Patient enjoys spending time at a senior recreational center. Senior activities and dinners.       Patient walks ~15 minutes per day.   Social Determinants of Health   Financial Resource Strain: Low Risk  (09/05/2022)   Overall Financial Resource Strain (CARDIA)    Difficulty of Paying Living Expenses: Not hard at all  Food Insecurity: No Food Insecurity (09/05/2022)   Hunger Vital Sign    Worried About Running Out of Food in the Last Year: Never true    Ran Out of Food in the Last Year: Never true  Transportation Needs: No Transportation Needs (09/05/2022)   PRAPARE - Administrator, Civil Service (Medical): No    Lack of Transportation (Non-Medical): No  Physical Activity: Insufficiently Active (09/05/2022)   Exercise Vital Sign    Days of Exercise per Week: 3 days    Minutes of Exercise per Session: 10 min  Stress: No Stress Concern Present (09/05/2022)   [employer] of  Occupational Health - Occupational Stress Questionnaire    Feeling of Stress : Not at all  Social Connections: Moderately Integrated (09/05/2022)   Social Connection and Isolation Panel [NHANES]    Frequency of Communication with Friends and Family: More than three times a week    Frequency of Social Gatherings with Friends and Family: Twice a week    Attends Religious Services: More than 4 times per year    Active Member of [employer] or Organizations: Yes    Attends Engineer, structural: More than 4 times per year    Marital Status: Never married     Family History: The patient's family history includes Alcohol abuse in her son; Alzheimer's disease in her father and mother; Breast cancer in her sister; Gout in her sister; Heart disease in her mother and son; Heart failure in her father; Hyperlipidemia in her mother; Hypertension in her father, mother, sister, sister, and son; Osteoporosis in her mother; Pancreatitis in her son. There is no history of Cancer, Colon cancer, Esophageal cancer, Stomach cancer, or Rectal cancer.  ROS:   Please see the history of present illness.     All other systems reviewed and are negative.  EKGs/Labs/Other Studies Reviewed:    The following studies were reviewed today:   EKG:   01/26/2023: Normal sinus rhythm, rate 62, LVH with repolarization abnormalities   Recent Labs: 08/07/2022: ALT 15; TSH 1.369 09/14/2022: Hemoglobin 11.2; Platelets 302 01/05/2023: BUN 27; Creatinine, Ser 1.28; Potassium 4.6; Sodium 141  Recent Lipid Panel    Component Value Date/Time   CHOL 189 12/28/2021 1416   TRIG 196 (H) 12/28/2021 1416   HDL 50 12/28/2021 1416   CHOLHDL 3.8 12/28/2021 1416   CHOLHDL 4.7 10/01/2015 0732   VLDL 27 10/01/2015 0732   LDLCALC 105 (H) 12/28/2021 1416    Physical Exam:    VS:  BP (!) 142/72 (BP Location: Left Arm, Patient Position: Sitting, Cuff Size: Normal)   Pulse 62   Ht 5\' 5"  (1.651 m)   Wt 194 lb (88 kg)   SpO2 98%   BMI  32.28 kg/m     Wt Readings from Last 3 Encounters:  01/26/23 194  lb (88 kg)  01/17/23 194 lb 9.6 oz (88.3 kg)  01/05/23 196 lb 6.4 oz (89.1 kg)     GEN:  Well nourished, well developed in no acute distress HEENT: Normal NECK: No JVD; No carotid bruits LYMPHATICS: No lymphadenopathy CARDIAC: RRR,2/6 systolic murmur RESPIRATORY:  Clear to auscultation without rales, wheezing or rhonchi  ABDOMEN: Soft, non-tender, non-distended MUSCULOSKELETAL:  Trace edema; No deformity  SKIN: Warm and dry NEUROLOGIC:  Alert and oriented x 3 PSYCHIATRIC:  Normal affect   ASSESSMENT:    1. CAD in native artery   2. Chronic diastolic heart failure (HCC)   3. Hypertension, unspecified type   4. Hyperlipidemia, unspecified hyperlipidemia type   5. OSA (obstructive sleep apnea)   6. Medication management   7. Sleep disorder      PLAN:    CAD: Reports atypical chest pain, no clear relationship with exertion but does appear to be correlated to stress.  Coronary CTA on 11/26/2020 showed moderate CAD in mid and distal RCA, calcium score 936 (97th percentile).  CT FFR suggested total occlusion in mid to distal RCA and indeterminate FFR value in distal LAD that may be due to vessel tapering of small caliber distal vessel stenosis.  Cath 12/13/2020 showed chronic subtotal (99%) occlusion of distal RCA with left-to-right collaterals, mild proximal LAD disease.  Echocardiogram 12/10/2020 showed normal biventricular function, no significant valvular disease. -Continue aspirin 81 mg daily -Continue atorvastatin 80 mg daily -Continue carvedilol 25 mg twice daily -Continue Imdur 60 mg daily -As needed SL NTG -If refractory angina despite optimized antianginal regimen, could consider PCI to distal RCA.  Currently denies any anginal symptoms  Chronic diastolic heart failure: LVEDP 40-98 on cath 12/13/2020.  Started on Lasix 40 mg daily.  On potassium chloride 20 meq twice daily.  Appears euvolemic.  Check BMET,  magnesium  Heart murmur: Echocardiogram 12/10/2020 showed normal biventricular function, no significant valvular disease.  Renal infarct: Presented with renal infarct in 2017.  TEE showed possible aortic valve vegetation versus Lambls excrescence.  Blood cultures negative.  Completed 6 months of warfarin.  Has not had recurrent evidence of embolic events.  Echocardiogram 12/10/2020 unremarkable.  Zio patch x14 days 11/15/2021 showed no significant arrhythmias.  Hypertension: On amlodipine 5 mg nightly, carvedilol 12.5 mg twice daily, Imdur 60 mg daily, losartan 100 mg daily, Lasix 40 mg daily -Mildly elevated in clinic today.  Suspect untreated OSA contributing, will refer to sleep medicine.  Asked her to check BP twice daily for next week and let us know results  Hyperlipidemia: On atorvastatin 80 mg daily and Zetia 10 mg daily, LDL 105 on 12/28/2021.  Check lipid panel  OSA: Was on CPAP previously but unfortunately was noncompliant and her machine was taken back by her DME company.  Will refer to sleep medicine   RTC in 6 months   Medication Adjustments/Labs and Tests Ordered: Current medicines are reviewed at length with the patient today.  Concerns regarding medicines are outlined above.  Orders Placed This Encounter  Procedures   Basic metabolic panel   Lipid panel   Magnesium   Ambulatory referral to Pulmonology   EKG 12-Lead      No orders of the defined types were placed in this encounter.      Patient Instructions  Medication Instructions:  NO CHANGES *If you need a refill on your cardiac medications before your next appointment, please call your pharmacy*   Lab Work: BMET, LIPID PANEL, AND MAGNESIUM TODAY If you have  labs (blood work) drawn today and your tests are completely normal, you will receive your results only by: MyChart Message (if you have MyChart) OR A paper copy in the mail If you have any lab test that is abnormal or we need to change your treatment,  we will call you to review the results.   Testing/Procedures: NO TESTING   Follow-Up: At [facility], you and your health needs are our priority.  As part of our continuing mission to provide you with exceptional heart care, we have created designated Provider Care Teams.  These Care Teams include your primary Cardiologist (physician) and Advanced Practice Providers (APPs -  Physician Assistants and Nurse Practitioners) who all work together to provide you with the care you need, when you need it.   Your next appointment:   6 month(s)  Provider:   Little Ishikawa, MD   Other Instructions CHECK BLOOD PRESSURE TWICE A DAY AT THE SAME TIME EACH DAY FOR 1 WEEK SEND RECORD OF BLOOD PRESSURE TO PROVIDER VIA MYCHART.  REFERRAL TO [facility] FOR SLEEP MEDICINE.    Signed, Little Ishikawa, MD  01/26/2023 4:53 PM    [facility]

## 2023-01-26 NOTE — Patient Instructions (Addendum)
Medication Instructions:  NO CHANGES *If you need a refill on your cardiac medications before your next appointment, please call your pharmacy*   Lab Work: BMET, LIPID PANEL, AND MAGNESIUM TODAY If you have labs (blood work) drawn today and your tests are completely normal, you will receive your results only by: MyChart Message (if you have MyChart) OR A paper copy in the mail If you have any lab test that is abnormal or we need to change your treatment, we will call you to review the results.   Testing/Procedures: NO TESTING   Follow-Up: At [facility], you and your health needs are our priority.  As part of our continuing mission to provide you with exceptional heart care, we have created designated Provider Care Teams.  These Care Teams include your primary Cardiologist (physician) and Advanced Practice Providers (APPs -  Physician Assistants and Nurse Practitioners) who all work together to provide you with the care you need, when you need it.   Your next appointment:   6 month(s)  Provider:   Little Ishikawa, MD   Other Instructions CHECK BLOOD PRESSURE TWICE A DAY AT THE SAME TIME EACH DAY FOR 1 WEEK SEND RECORD OF BLOOD PRESSURE TO PROVIDER VIA MYCHART.  REFERRAL TO [facility] FOR SLEEP MEDICINE.

## 2023-01-27 LAB — BASIC METABOLIC PANEL
BUN/Creatinine Ratio: 16 (ref 12–28)
BUN: 18 mg/dL (ref 8–27)
CO2: 27 mmol/L (ref 20–29)
Calcium: 9.3 mg/dL (ref 8.7–10.3)
Chloride: 104 mmol/L (ref 96–106)
Creatinine, Ser: 1.14 mg/dL — ABNORMAL HIGH (ref 0.57–1.00)
Glucose: 91 mg/dL (ref 70–99)
Potassium: 4.2 mmol/L (ref 3.5–5.2)
Sodium: 145 mmol/L — ABNORMAL HIGH (ref 134–144)
eGFR: 51 mL/min/{1.73_m2} — ABNORMAL LOW (ref >59)

## 2023-01-27 LAB — MAGNESIUM: Magnesium: 1.9 mg/dL (ref 1.6–2.3)

## 2023-01-27 LAB — LIPID PANEL
Chol/HDL Ratio: 3.5 ratio (ref 0.0–4.4)
Cholesterol, Total: 182 mg/dL (ref 100–199)
HDL: 52 mg/dL (ref >39)
LDL Chol Calc (NIH): 100 mg/dL — ABNORMAL HIGH (ref 0–99)
Triglycerides: 173 mg/dL — ABNORMAL HIGH (ref 0–149)
VLDL Cholesterol Cal: 30 mg/dL (ref 5–40)

## 2023-01-29 ENCOUNTER — Other Ambulatory Visit: Payer: Self-pay

## 2023-01-29 DIAGNOSIS — E785 Hyperlipidemia, unspecified: Secondary | ICD-10-CM

## 2023-01-29 MED ORDER — EZETIMIBE 10 MG PO TABS: 10.0000 mg | ORAL_TABLET | Freq: Every day | ORAL | 3 refills | Status: AC

## 2023-02-19 ENCOUNTER — Other Ambulatory Visit: Payer: Self-pay

## 2023-02-19 ENCOUNTER — Ambulatory Visit (HOSPITAL_COMMUNITY)
Admission: EM | Admit: 2023-02-19 | Discharge: 2023-02-19 | Disposition: A | Discharge status: Home / Self Care | Payer: 59 | Attending: Internal Medicine | Admitting: Internal Medicine

## 2023-02-19 ENCOUNTER — Encounter (HOSPITAL_COMMUNITY): Payer: Self-pay | Admitting: Emergency Medicine

## 2023-02-19 DIAGNOSIS — R197 Diarrhea, unspecified: Secondary | ICD-10-CM

## 2023-02-19 DIAGNOSIS — R1084 Generalized abdominal pain: Secondary | ICD-10-CM | POA: Diagnosis not present

## 2023-02-19 DIAGNOSIS — Z20822 Contact with and (suspected) exposure to covid-19: Secondary | ICD-10-CM

## 2023-02-19 LAB — POCT URINALYSIS DIP (MANUAL ENTRY)
Bilirubin, UA: NEGATIVE
Blood, UA: NEGATIVE
Glucose, UA: NEGATIVE mg/dL
Ketones, POC UA: NEGATIVE mg/dL
Leukocytes, UA: NEGATIVE
Nitrite, UA: NEGATIVE
Protein Ur, POC: NEGATIVE mg/dL
Spec Grav, UA: 1.01 (ref 1.010–1.025)
Urobilinogen, UA: 0.2 U/dL
pH, UA: 5.5 (ref 5.0–8.0)

## 2023-02-19 NOTE — ED Notes (Signed)
Cyto ordered post discharge.  A staff member called patient, patient returned. Patient provided a self swab -cyto.  Patient discharged from [facility]

## 2023-02-19 NOTE — ED Triage Notes (Addendum)
Abdominal pain, no vomiting.  Patient has had diarrhea.  Patient reports vaginal itching.    Patient has had mylanta

## 2023-02-19 NOTE — ED Provider Notes (Addendum)
[facility]    CSN: [id number] Arrival date & time: 02/19/23  1440      History   Chief Complaint Chief Complaint  Patient presents with   Abdominal Pain    HPI [name] is a 72 y.o. female.   Patient presents with abdominal cramping and diarrhea that started this morning.  Denies nausea or vomiting.  Denies any known sick contacts but patient reports that she ate some chicken that did not appear fully cooked yesterday.  She has treated with Mylanta.  She is able to tolerate fluids.  Denies any upper respiratory symptoms or fever.  Denies any recent travel outside the [place].  Blood pressure is elevated but she reports that she has been taking her medication.  She is attributing blood pressure to pain.  Patient is not reporting any chest pain, shortness of breath, headache, dizziness, nausea, vomiting.  Patient denies any pertinent chronic gastrointestinal problems. Denies blood in stool. She is urinating appropriately.  Patient is also reporting some vaginal itching with no discharge that started about a week ago.  Patient denies any exposure or concern for STD.   Abdominal Pain   Past Medical History:  Diagnosis Date   Abnormal EKG 2012   hospitalized for T wave inversion in lateral leads with MSK chest pains, normal ECHO, negative trops, no cardiology consult. recent EKG 7/15 stable T wave inversions.    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   Arthritis 2000   Arthritis 2011   Bilateral leg pain 06/14/2015   Clotting disorder (HCC)    blood clot kidney 2017   Heart murmur    Hyperlipidemia 2011   Hypertension 2011   Left elbow pain 03/31/2020   Meniscus tear 2000   L KNEE   Pelvic pain 10/07/2018   Renal infarct (HCC) 2017   blood clot in the kidney    Patient Active Problem List   Diagnosis Date Noted   Stomach discomfort 01/05/2023   Acute cough 08/10/2022   Leukocytosis 08/10/2022   Viral URI 04/20/2022   Scoliosis of lumbar spine  02/28/2022   Candida glabrata infection 02/28/2022   Daytime somnolence 01/12/2021   Atypical chest pain 12/13/2020   Abnormal cardiac CT angiography 12/13/2020   Contact dermatitis 01/14/2019   Vaginal itching 12/05/2018   Leg edema 08/28/2018   Thickened endometrium    Umbilical hernia without obstruction and without gangrene 01/01/2018   Osteopenia 09/05/2017   Closed nondisplaced spiral fracture of shaft of right tibia 03/28/2017   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/30/2015   HLD (hyperlipidemia)    Aortic atherosclerosis (HCC)    Back pain 07/08/2015   Scotoma of blind spot area in visual field 06/14/2015   Seasonal allergies 03/09/2015   Flank pain 03/09/2015   Hypopigmentation 03/09/2015   Unilateral primary osteoarthritis, left knee 07/08/2014   DJD (degenerative joint disease), lumbar 07/08/2014   Osteoarthritis of both knees 07/08/2014   Urinary frequency 06/11/2014   Allergic rhinoconjunctivitis of both eyes 06/11/2014   Nearsightedness 04/27/2014   Abnormal EKG    Hypertension 12/11/2013    Past Surgical History:  Procedure Laterality Date   ABDOMINAL SURGERY  2010   for bowel blockage   COLON SURGERY  2010   bowel blockage   COLONOSCOPY  09/2014   HYSTEROSCOPY WITH D & C N/A 05/08/2018   Procedure: DILATATION AND CURETTAGE /HYSTEROSCOPY;  Surgeon: Catalina Antigua, MD;  Location: [facility];  Service: Gynecology;  Laterality: N/A;  KNEE ARTHROSCOPY Left 09/25/2013   Procedure: LEFT KNEE ARTHROSCOPY WITH PARTIAL MEDIAL AND LATERAL  MENISCECTOMY/DEBRIDEMENT/MICRO FRACTURE MEDIAL FEMORAL CONDYLE, REMOVAL OF OSTEOCONDRAL FRAGMENT;  Surgeon: Javier Docker, MD;  Location: [facility];  Service: Orthopedics;  Laterality: Left;   KNEE SURGERY Right 1999   LEFT HEART CATH AND CORONARY ANGIOGRAPHY N/A 12/13/2020   Procedure: LEFT HEART CATH AND CORONARY ANGIOGRAPHY;  Surgeon: Yvonne Kendall, MD;  Location: [facility];  Service:  Cardiovascular;  Laterality: N/A;   POLYPECTOMY     TEE WITHOUT CARDIOVERSION N/A 10/04/2015   Procedure: TRANSESOPHAGEAL ECHOCARDIOGRAM (TEE);  Surgeon: Quintella Reichert, MD;  Location: [facility];  Service: Cardiovascular;  Laterality: N/A;   TUBAL LIGATION  1978    OB History     Gravida  2   Para  2   Term      Preterm      AB      Living  2      SAB      IAB      Ectopic      Multiple      Live Births               Home Medications    Prior to Admission medications   Medication Sig Start Date End Date Taking? Authorizing Provider  amLODipine (NORVASC) 5 MG tablet Take 1 tablet (5 mg total) by mouth at bedtime. 01/05/23   Lorayne Bender, MD  aspirin EC 81 MG tablet Take 1 tablet (81 mg total) daily by mouth. 01/31/17   Loletta Specter, PA-C  atorvastatin (LIPITOR) 80 MG tablet TAKE 1 TABLET(80 MG) BY MOUTH DAILY AT 6 PM 09/04/22   Little Ishikawa, MD  Capsaicin-Menthol-Methyl Sal (CAPSAICIN-METHYL SAL-MENTHOL) 0.025-1-12 % CREA Apply 1 Tube topically daily. 05/05/22   Jerre Simon, MD  carvedilol (COREG) 12.5 MG tablet TAKE 1 TABLET(12.5 MG) BY MOUTH TWICE DAILY WITH A MEAL 10/05/22   Jerre Simon, MD  ciclopirox [facility]) 8 % solution Apply topically at bedtime. Apply over nail and surrounding skin. Apply daily over previous coat. After seven (7) days, may remove with alcohol and continue cycle. 07/13/22   Candelaria Stagers, DPM  diclofenac Sodium (VOLTAREN) 1 % GEL APPLY 2 GRAMS TOPICALLY TO THE AFFECTED AREA FOUR TIMES DAILY 12/08/22   Jerre Simon, MD  ezetimibe (ZETIA) 10 MG tablet Take 1 tablet (10 mg total) by mouth daily. 01/29/23 04/29/23  Little Ishikawa, MD  fluticasone (FLONASE) 50 MCG/ACT nasal spray SHAKE LIQUID AND USE 2 SPRAYS IN EACH NOSTRIL TWICE DAILY 07/07/22   Jerre Simon, MD  furosemide (LASIX) 40 MG tablet Take 1 tablet (40 mg total) by mouth daily. 09/14/22 09/14/23  Jerre Simon, MD  gabapentin (NEURONTIN) 300 MG capsule Take 1  capsule (300 mg total) by mouth 2 (two) times daily. 08/03/22 11/01/22  Jerre Simon, MD  hydrocortisone 2.5 % ointment Apply topically 2 (two) times daily. 08/15/22   Maury Dus, MD  isosorbide mononitrate (IMDUR) 60 MG 24 hr tablet TAKE 1 TABLET(60 MG) BY MOUTH DAILY 09/04/22   Little Ishikawa, MD  ketoconazole (NIZORAL) 2 % cream Apply 1 Application topically as needed. 11/13/22   [provider]  ketoconazole (NIZORAL) 2 % shampoo Apply 1 Application topically as needed. 11/13/22   [provider]  lidocaine (HM LIDOCAINE PATCH) 4 % Place 1 patch onto the skin daily. 12/05/22   Zenia Resides, MD  losartan (COZAAR) 100 MG tablet TAKE 1 TABLET(100 MG)  BY MOUTH DAILY 08/07/22   Jerre Simon, MD  Multiple Vitamin (MULITIVITAMIN WITH MINERALS) TABS Take 1 tablet by mouth daily.    [provider]  nitroGLYCERIN (NITROSTAT) 0.4 MG SL tablet PLACE 1 TABLET UNDER THE TONGUE EVERY 5 MINUTES AS NEEDED. 07/06/22   Little Ishikawa, MD  potassium chloride SA (KLOR-CON M) 20 MEQ tablet TAKE 1 TABLET(20 MEQ) BY MOUTH TWICE DAILY 12/08/22   Jerre Simon, MD  predniSONE (STERAPRED UNI-PAK 21 TAB) 10 MG (21) TBPK tablet Take 10 mg by mouth as needed. follow package directions Patient not taking: Reported on 02/19/2023 01/02/23   [provider]  tizanidine (ZANAFLEX) 2 MG capsule Take 2 mg by mouth as needed. 06/25/17   [provider]    Family History Family History  Problem Relation Age of Onset   Hypertension Mother    Heart disease Mother        has a pacemaker    Alzheimer's disease Mother    Hyperlipidemia Mother    Osteoporosis Mother    Heart failure Father    Hypertension Father    Alzheimer's disease Father    Hypertension Sister    Breast cancer Sister    Hypertension Sister    Gout Sister    Heart disease Son    Alcohol abuse Son    Pancreatitis Son    Hypertension Son    Cancer Neg Hx    Colon cancer Neg Hx    Esophageal  cancer Neg Hx    Stomach cancer Neg Hx    Rectal cancer Neg Hx     Social History Social History   Tobacco Use   Smoking status: Former    Current packs/day: 0.00    Types: Cigarettes    Quit date: 07/29/2010    Years since quitting: 12.5    Passive exposure: Past   Smokeless tobacco: Never  Vaping Use   Vaping status: Never Used  Substance Use Topics   Alcohol use: Not Currently   Drug use: Never     Allergies   Shrimp [shellfish allergy], Latex, and Penicillins   Review of Systems Review of Systems Per HPI  Physical Exam Triage Vital Signs ED Triage Vitals  Encounter Vitals Group     BP 02/19/23 1659 (!) 188/86     Systolic BP Percentile --      Diastolic BP Percentile --      Pulse Rate 02/19/23 1659 68     Resp 02/19/23 1659 20     Temp 02/19/23 1659 97.8 F (36.6 C)     Temp Source 02/19/23 1659 Oral     SpO2 02/19/23 1659 98 %     Weight --      Height --      Head Circumference --      Peak Flow --      Pain Score 02/19/23 1656 8     Pain Loc --      Pain Education --      Exclude from Growth Chart --    No data found.  Updated Vital Signs BP (!) 183/70 (BP Location: Left Arm)   Pulse 68   Temp 97.8 F (36.6 C) (Oral)   Resp 20   SpO2 98%   Visual Acuity Right Eye Distance:   Left Eye Distance:   Bilateral Distance:    Right Eye Near:   Left Eye Near:    Bilateral Near:     Physical Exam Constitutional:  General: She is not in acute distress.    Appearance: Normal appearance. She is not toxic-appearing or diaphoretic.  HENT:     Mouth/Throat:     Mouth: Mucous membranes are moist.     Pharynx: No posterior oropharyngeal erythema.  Cardiovascular:     Rate and Rhythm: Normal rate and regular rhythm.     Pulses: Normal pulses.     Heart sounds: Normal heart sounds.  Pulmonary:     Effort: Pulmonary effort is normal. No respiratory distress.     Breath sounds: Normal breath sounds. No stridor. No wheezing, rhonchi or rales.   Abdominal:     General: Bowel sounds are normal. There is no distension.     Palpations: Abdomen is soft.     Tenderness: There is no abdominal tenderness.  Musculoskeletal:        General: Normal range of motion.     Cervical back: Normal range of motion.  Skin:    General: Skin is warm.  Neurological:     General: No focal deficit present.     Mental Status: She is alert and oriented to person, place, and time.  Psychiatric:        Mood and Affect: Mood normal.        Behavior: Behavior normal.      UC Treatments / Results  Labs (all labs ordered are listed, but only abnormal results are displayed) Labs Reviewed  POCT URINALYSIS DIP (MANUAL ENTRY) - Abnormal; Notable for the following components:      Result Value   Clarity, UA cloudy (*)    All other components within normal limits  SARS CORONAVIRUS 2 (TAT 6-24 HRS)  CERVICOVAGINAL ANCILLARY ONLY    EKG   Radiology No results found.  Procedures Procedures (including critical care time)  Medications Ordered in UC Medications - No data to display  Initial Impression / Assessment and Plan / UC Course  I have reviewed the triage vital signs and the nursing notes.  Pertinent labs & imaging results that were available during my care of the patient were reviewed by me and considered in my medical decision making (see chart for details).     Suspect food related illness versus viral illness.  There are no signs of acute abdomen or dehydration on exam that would warrant imaging or emergent evaluation.  Advised supportive care, fluids, rest and strict follow-up with the ER if symptoms persist or worsen.  Blood pressure elevated but this appears baseline for patient and suspect pain can be contributing.  Encouraged patient to monitor blood pressure at home very closely over the next 24 to 48 hours and follow-up if it remains elevated.  Patient requested COVID test so this is pending.  Cervicovaginal swab to test for BV and  yeast pending for vaginal itching.  Awaiting result prior to treatment.  Patient verbalized understanding and was agreeable with plan. Final Clinical Impressions(s) / UC Diagnoses   Final diagnoses:  Generalized abdominal cramping  Diarrhea, unspecified type  Encounter for laboratory testing for COVID-19 virus     Discharge Instructions      Suspect your symptoms are food related versus viral illness that should simply run its course.  Ensure adequate fluid, rest, bland diet as we discussed.  COVID test is pending per your request.  We will call only if it is positive.  Monitor blood pressure closely at home and follow-up with PCP or urgent care if it remains elevated.    ED Prescriptions  None    PDMP not reviewed this encounter.   Gustavus Bryant, [place] 02/19/23 1728    Gustavus Bryant, [place] 02/19/23 1729    Gustavus Bryant, [place] 02/19/23 1733    Gustavus Bryant, [place] 02/19/23 1734

## 2023-02-19 NOTE — Discharge Instructions (Signed)
Suspect your symptoms are food related versus viral illness that should simply run its course.  Ensure adequate fluid, rest, bland diet as we discussed.  COVID test is pending per your request.  We will call only if it is positive.  Monitor blood pressure closely at home and follow-up with PCP or urgent care if it remains elevated.

## 2023-02-20 LAB — CERVICOVAGINAL ANCILLARY ONLY
Bacterial Vaginitis (gardnerella): NEGATIVE
Candida Glabrata: POSITIVE — AB
Candida Vaginitis: NEGATIVE
Comment: NEGATIVE
Comment: NEGATIVE
Comment: NEGATIVE

## 2023-02-20 LAB — SARS CORONAVIRUS 2 (TAT 6-24 HRS): SARS Coronavirus 2: NEGATIVE

## 2023-03-07 NOTE — Congregational Nurse Program (Unsigned)
  Dept: [phone]   Congregational Nurse Program Note  Date of Encounter: 10/29/2022  Past Medical History: Past Medical History:  Diagnosis Date   Abnormal EKG 2012   hospitalized for T wave inversion in lateral leads with [facility] chest pains, normal ECHO, negative trops, no cardiology consult. recent EKG 7/15 stable T wave inversions.    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

## 2023-03-13 ENCOUNTER — Other Ambulatory Visit: Payer: Self-pay | Admitting: Student

## 2023-03-13 DIAGNOSIS — Z1231 Encounter for screening mammogram for malignant neoplasm of breast: Secondary | ICD-10-CM

## 2023-03-24 ENCOUNTER — Emergency Department (HOSPITAL_COMMUNITY): Payer: 59

## 2023-03-24 ENCOUNTER — Encounter (HOSPITAL_COMMUNITY): Payer: Self-pay

## 2023-03-24 ENCOUNTER — Emergency Department (HOSPITAL_COMMUNITY)
Admission: EM | Admit: 2023-03-24 | Discharge: 2023-03-24 | Disposition: A | Discharge status: Home / Self Care | Payer: 59 | Attending: Emergency Medicine | Admitting: Emergency Medicine

## 2023-03-24 ENCOUNTER — Other Ambulatory Visit: Payer: Self-pay

## 2023-03-24 DIAGNOSIS — Z7982 Long term (current) use of aspirin: Secondary | ICD-10-CM | POA: Insufficient documentation

## 2023-03-24 DIAGNOSIS — R109 Unspecified abdominal pain: Secondary | ICD-10-CM | POA: Diagnosis not present

## 2023-03-24 DIAGNOSIS — R079 Chest pain, unspecified: Secondary | ICD-10-CM | POA: Insufficient documentation

## 2023-03-24 DIAGNOSIS — R202 Paresthesia of skin: Secondary | ICD-10-CM | POA: Diagnosis present

## 2023-03-24 DIAGNOSIS — G629 Polyneuropathy, unspecified: Secondary | ICD-10-CM

## 2023-03-24 DIAGNOSIS — B379 Candidiasis, unspecified: Secondary | ICD-10-CM | POA: Insufficient documentation

## 2023-03-24 DIAGNOSIS — G5793 Unspecified mononeuropathy of bilateral lower limbs: Secondary | ICD-10-CM | POA: Insufficient documentation

## 2023-03-24 DIAGNOSIS — Z91040 Latex allergy status: Secondary | ICD-10-CM | POA: Insufficient documentation

## 2023-03-24 DIAGNOSIS — I10 Essential (primary) hypertension: Secondary | ICD-10-CM | POA: Insufficient documentation

## 2023-03-24 DIAGNOSIS — Z79899 Other long term (current) drug therapy: Secondary | ICD-10-CM | POA: Diagnosis not present

## 2023-03-24 DIAGNOSIS — M7989 Other specified soft tissue disorders: Secondary | ICD-10-CM | POA: Diagnosis not present

## 2023-03-24 LAB — URINALYSIS, W/ REFLEX TO CULTURE (INFECTION SUSPECTED)
Bilirubin Urine: NEGATIVE
Glucose, UA: NEGATIVE mg/dL
Hgb urine dipstick: NEGATIVE
Ketones, ur: NEGATIVE mg/dL
Nitrite: NEGATIVE
Protein, ur: NEGATIVE mg/dL
Specific Gravity, Urine: 1.01 (ref 1.005–1.030)
pH: 5 (ref 5.0–8.0)

## 2023-03-24 LAB — CBC
HCT: 40.2 % (ref 36.0–46.0)
Hemoglobin: 12.7 g/dL (ref 12.0–15.0)
MCH: 31.4 pg (ref 26.0–34.0)
MCHC: 31.6 g/dL (ref 30.0–36.0)
MCV: 99.5 fL (ref 80.0–100.0)
Platelets: 316 10*3/uL (ref 150–400)
RBC: 4.04 MIL/uL (ref 3.87–5.11)
RDW: 13.9 % (ref 11.5–15.5)
WBC: 11.6 10*3/uL — ABNORMAL HIGH (ref 4.0–10.5)
nRBC: 0 % (ref 0.0–0.2)

## 2023-03-24 LAB — COMPREHENSIVE METABOLIC PANEL
ALT: 15 U/L (ref 0–44)
AST: 19 U/L (ref 15–41)
Albumin: 3.8 g/dL (ref 3.5–5.0)
Alkaline Phosphatase: 100 U/L (ref 38–126)
Anion gap: 8 (ref 5–15)
BUN: 16 mg/dL (ref 8–23)
CO2: 27 mmol/L (ref 22–32)
Calcium: 9.5 mg/dL (ref 8.9–10.3)
Chloride: 104 mmol/L (ref 98–111)
Creatinine, Ser: 1.2 mg/dL — ABNORMAL HIGH (ref 0.44–1.00)
GFR, Estimated: 48 mL/min — ABNORMAL LOW (ref >60)
Glucose, Bld: 147 mg/dL — ABNORMAL HIGH (ref 70–99)
Potassium: 3.4 mmol/L — ABNORMAL LOW (ref 3.5–5.1)
Sodium: 139 mmol/L (ref 135–145)
Total Bilirubin: 0.5 mg/dL (ref <1.2)
Total Protein: 7.5 g/dL (ref 6.5–8.1)

## 2023-03-24 LAB — TROPONIN I (HIGH SENSITIVITY)
Troponin I (High Sensitivity): 8 ng/L (ref <18)
Troponin I (High Sensitivity): 8 ng/L (ref <18)

## 2023-03-24 MED ORDER — FLUCONAZOLE 150 MG PO TABS: 150.0000 mg | ORAL_TABLET | Freq: Once | ORAL | 0 refills | Status: AC

## 2023-03-24 MED ORDER — HYDROCHLOROTHIAZIDE 25 MG PO TABS: 25.0000 mg | ORAL_TABLET | Freq: Every day | ORAL | 0 refills | Status: DC

## 2023-03-24 MED ORDER — GABAPENTIN 300 MG PO CAPS: 300.0000 mg | ORAL_CAPSULE | Freq: Two times a day (BID) | ORAL | 0 refills | Status: AC

## 2023-03-24 MED ORDER — FLUCONAZOLE 150 MG PO TABS
150.0000 mg | ORAL_TABLET | Freq: Once | ORAL | Status: AC
Start: 2023-03-24 — End: 2023-03-24
  Administered 2023-03-24: 150 mg via ORAL
  Filled 2023-03-24: qty 1

## 2023-03-24 MED ORDER — GABAPENTIN 300 MG PO CAPS
300.0000 mg | ORAL_CAPSULE | Freq: Once | ORAL | Status: AC
  Administered 2023-03-24: 300 mg via ORAL
  Filled 2023-03-24: qty 1

## 2023-03-24 MED ORDER — HYDROCHLOROTHIAZIDE 25 MG PO TABS
25.0000 mg | ORAL_TABLET | Freq: Once | ORAL | Status: AC
  Administered 2023-03-24: 25 mg via ORAL
  Filled 2023-03-24: qty 1

## 2023-03-24 NOTE — ED Provider Triage Note (Signed)
Emergency Medicine Provider Triage Evaluation Note  [name] , a 72 y.o. female  was evaluated in triage.  Pt complains of multiple complaints.  Review of Systems  Positive:  Negative:   Physical Exam  BP (!) 162/79 (BP Location: Left Arm)   Pulse 72   Temp 97.9 F (36.6 C)   Resp 14   Ht 5\' 5"  (1.651 m)   Wt 86.2 kg   SpO2 99%   BMI 31.62 kg/m  Gen:   Awake, no distress   Resp:  Normal effort  MSK:   Moves extremities without difficulty  Other:    Medical Decision Making  Medically screening exam initiated at 5:18 PM.  Appropriate orders placed.  [name] was informed that the remainder of the evaluation will be completed by another provider, this initial triage assessment does not replace that evaluation, and the importance of remaining in the ED until their evaluation is complete.  Right arm pain, left flank pain, swelling and pain in hands and feet, vaginal itching, loose stools. Denies trauma to the areas.   Wants to make sure that her heart is not in danger. Also wants UTI screening.      Dorthy Cooler, [place] 03/24/23 1720

## 2023-03-24 NOTE — Discharge Instructions (Addendum)
As we discussed you have candidal infection in your last wet prep.  You are given a dose of Diflucan in the ER and I prescribed another dose for you to go home with.  You may have medication side effect from your Norvasc so I added hydrochlorothiazide to help you with your leg swelling also to help control your blood pressure  Finally I have prescribed Neurontin to help you with paresthesias  Please follow-up with your doctor next week  Return to ER if you have worse leg swelling or vaginal itchiness or flank pain or abdominal pain

## 2023-03-24 NOTE — ED Triage Notes (Addendum)
Pt reports right arm pain, left flank pain, burning sensations and swelling to bilateral feet and entire legs as well as vaginal itching. She reports she feels bloated and also has loose stools.

## 2023-03-24 NOTE — ED Provider Notes (Signed)
[place] EMERGENCY DEPARTMENT AT [facility] Provider Note   CSN: [id number] Arrival date & time: 03/24/23  1556     History  Chief Complaint  Patient presents with   Multiple Complaints    [name] is a 72 y.o. female history of hypertension, here presenting with vaginal itchiness and paresthesias in her legs and flank pain.  Symptoms going on for several weeks.  Patient states that she went to urgent care several weeks ago for similar complaints.  She had a wet prep done and also urinalysis that were normal.  Patient states that she has worsening symptoms and she wants to get checked out.  Patient states that she has bilateral foot numbness.  She states that she also has some vaginal itchiness that is persistent.  Denies any fevers or chills  The history is provided by the patient.       Home Medications Prior to Admission medications   Medication Sig Start Date End Date Taking? Authorizing Provider  amLODipine (NORVASC) 5 MG tablet Take 1 tablet (5 mg total) by mouth at bedtime. 01/05/23   Lorayne Bender, MD  aspirin EC 81 MG tablet Take 1 tablet (81 mg total) daily by mouth. 01/31/17   Loletta Specter, PA-C  atorvastatin (LIPITOR) 80 MG tablet TAKE 1 TABLET(80 MG) BY MOUTH DAILY AT 6 PM 09/04/22   Little Ishikawa, MD  Capsaicin-Menthol-Methyl Sal (CAPSAICIN-METHYL SAL-MENTHOL) 0.025-1-12 % CREA Apply 1 Tube topically daily. 05/05/22   Jerre Simon, MD  carvedilol (COREG) 12.5 MG tablet TAKE 1 TABLET(12.5 MG) BY MOUTH TWICE DAILY WITH A MEAL 10/05/22   Jerre Simon, MD  ciclopirox [facility]) 8 % solution Apply topically at bedtime. Apply over nail and surrounding skin. Apply daily over previous coat. After seven (7) days, may remove with alcohol and continue cycle. 07/13/22   Candelaria Stagers, DPM  diclofenac Sodium (VOLTAREN) 1 % GEL APPLY 2 GRAMS TOPICALLY TO THE AFFECTED AREA FOUR TIMES DAILY 12/08/22   Jerre Simon, MD  ezetimibe (ZETIA) 10 MG tablet Take 1  tablet (10 mg total) by mouth daily. 01/29/23 04/29/23  Little Ishikawa, MD  fluticasone (FLONASE) 50 MCG/ACT nasal spray SHAKE LIQUID AND USE 2 SPRAYS IN EACH NOSTRIL TWICE DAILY 07/07/22   Jerre Simon, MD  furosemide (LASIX) 40 MG tablet Take 1 tablet (40 mg total) by mouth daily. 09/14/22 09/14/23  Jerre Simon, MD  gabapentin (NEURONTIN) 300 MG capsule Take 1 capsule (300 mg total) by mouth 2 (two) times daily. 08/03/22 11/01/22  Jerre Simon, MD  hydrocortisone 2.5 % ointment Apply topically 2 (two) times daily. 08/15/22   Maury Dus, MD  isosorbide mononitrate (IMDUR) 60 MG 24 hr tablet TAKE 1 TABLET(60 MG) BY MOUTH DAILY 09/04/22   Little Ishikawa, MD  ketoconazole (NIZORAL) 2 % cream Apply 1 Application topically as needed. 11/13/22   [provider]  ketoconazole (NIZORAL) 2 % shampoo Apply 1 Application topically as needed. 11/13/22   [provider]  lidocaine (HM LIDOCAINE PATCH) 4 % Place 1 patch onto the skin daily. 12/05/22   Zenia Resides, MD  losartan (COZAAR) 100 MG tablet TAKE 1 TABLET(100 MG) BY MOUTH DAILY 08/07/22   Jerre Simon, MD  Multiple Vitamin (MULITIVITAMIN WITH MINERALS) TABS Take 1 tablet by mouth daily.    [provider]  nitroGLYCERIN (NITROSTAT) 0.4 MG SL tablet PLACE 1 TABLET UNDER THE TONGUE EVERY 5 MINUTES AS NEEDED. 07/06/22   Little Ishikawa, MD  potassium chloride SA (  KLOR-CON M) 20 MEQ tablet TAKE 1 TABLET(20 MEQ) BY MOUTH TWICE DAILY 12/08/22   Jerre Simon, MD  predniSONE (STERAPRED UNI-PAK 21 TAB) 10 MG (21) TBPK tablet Take 10 mg by mouth as needed. follow package directions Patient not taking: Reported on 02/19/2023 01/02/23   [provider]  tizanidine (ZANAFLEX) 2 MG capsule Take 2 mg by mouth as needed. 06/25/17   [provider]      Allergies    Shrimp [shellfish allergy], Latex, and Penicillins    Review of Systems   Review of Systems  Genitourinary:  Positive for vaginal  pain.  All other systems reviewed and are negative.   Physical Exam Updated Vital Signs BP (!) 162/79 (BP Location: Left Arm)   Pulse 72   Temp 97.9 F (36.6 C)   Resp 14   Ht 5\' 5"  (1.651 m)   Wt 86.2 kg   SpO2 99%   BMI 31.62 kg/m  Physical Exam Vitals and nursing note reviewed.  Constitutional:      Comments: Chronically ill but not acutely ill  HENT:     Head: Normocephalic.     Nose: Nose normal.     Mouth/Throat:     Mouth: Mucous membranes are moist.  Eyes:     Extraocular Movements: Extraocular movements intact.     Pupils: Pupils are equal, round, and reactive to light.  Cardiovascular:     Rate and Rhythm: Normal rate and regular rhythm.     Pulses: Normal pulses.     Heart sounds: Normal heart sounds.  Pulmonary:     Effort: Pulmonary effort is normal.     Breath sounds: Normal breath sounds.  Abdominal:     General: Abdomen is flat.     Palpations: Abdomen is soft.  Musculoskeletal:     Cervical back: Normal range of motion and neck supple.  Skin:    General: Skin is warm.     Capillary Refill: Capillary refill takes less than 2 seconds.  Neurological:     General: No focal deficit present.     Mental Status: She is oriented to person, place, and time.     Comments: No saddle anesthesia and patient has normal strength and sensation bilateral arms and legs.  Patient has normal gait  Psychiatric:        Mood and Affect: Mood normal.        Behavior: Behavior normal.     ED Results / Procedures / Treatments   Labs (all labs ordered are listed, but only abnormal results are displayed) Labs Reviewed  CBC - Abnormal; Notable for the following components:      Result Value   WBC 11.6 (*)    All other components within normal limits  COMPREHENSIVE METABOLIC PANEL - Abnormal; Notable for the following components:   Potassium 3.4 (*)    Glucose, Bld 147 (*)    Creatinine, Ser 1.20 (*)    GFR, Estimated 48 (*)    All other components within normal  limits  URINALYSIS, W/ REFLEX TO CULTURE (INFECTION SUSPECTED) - Abnormal; Notable for the following components:   APPearance HAZY (*)    Leukocytes,Ua TRACE (*)    Bacteria, UA FEW (*)    All other components within normal limits  TROPONIN I (HIGH SENSITIVITY)  TROPONIN I (HIGH SENSITIVITY)    EKG None  Radiology DG Chest 2 View Result Date: 03/24/2023 CLINICAL DATA:  Chest pain for 2 days EXAM: CHEST - 2 VIEW COMPARISON:  08/09/2022 FINDINGS: Frontal and lateral views of the chest demonstrate an unremarkable cardiac silhouette. No acute airspace disease, effusion, or pneumothorax. Stable right convex lumbar scoliosis. No acute bony abnormality. IMPRESSION: 1. No acute intrathoracic process. Electronically Signed   By: Sharlet Salina M.D.   On: 03/24/2023 18:27    Procedures Procedures    Medications Ordered in ED Medications  gabapentin (NEURONTIN) capsule 300 mg (has no administration in time range)  fluconazole (DIFLUCAN) tablet 150 mg (has no administration in time range)  hydrochlorothiazide (HYDRODIURIL) tablet 25 mg (has no administration in time range)    ED Course/ Medical Decision Making/ A&P                                 Medical Decision Making Damita Ciaccia is a 72 y.o. female here presenting with bilateral leg paresthesias and vaginal itchiness and burning sensation and also bilateral feet swelling.  Patient has constellation of complaints that are subacute in nature.  I reviewed her wet prep from urgent care and its actually positive for Candida.  I wonder if her vaginal itchiness is from Candida.  Will give a course of Diflucan.  Patient also has bilateral leg paresthesias and some leg swelling.  She is on Norvasc and wonder if she has side effect from Norvasc.  Since she is hypertensive still I want to add on hydrochlorothiazide to help with the side effects.  Finally she has some paresthesias in her legs which I think is likely neuropathy.  She initially  complained some vague chest pain so CBC and CMP and troponin x 2 were ordered in triage.  9:27 PM I reviewed her labs and urinalysis.  White blood cell count is 11 which is at baseline.  Creatinine is at baseline as well.  Troponin negative x 2.  Chest x-ray is clear.  At this point patient is stable for discharge.  She was given a dose of Diflucan in the ER and we will give her another dose of Diflucan.  I also will prescribe her hydrochlorothiazide as well.  Finally I will prescribe gabapentin for her neuropathy.   Problems Addressed: Candida infection: acute illness or injury Leg swelling: chronic illness or injury Neuropathy: chronic illness or injury  Amount and/or Complexity of Data Reviewed Labs: ordered. Decision-making details documented in ED Course. Radiology: ordered and independent interpretation performed. Decision-making details documented in ED Course.  Risk Prescription drug management.    Final Clinical Impression(s) / ED Diagnoses Final diagnoses:  None    Rx / DC Orders ED Discharge Orders     None         Charlynne Pander, MD 03/24/23 2128

## 2023-03-26 ENCOUNTER — Telehealth: Payer: Self-pay

## 2023-03-26 NOTE — Telephone Encounter (Signed)
Patient LVM on nurse line regarding results from urine culture.   She states that she ended up going to the ED for UTI symptoms. She is asking why our office did not treat her for this.   I am not seeing any recent visits with our office.   Attempted to call patient to gather more information. She did not answer. LVM asking that she return call to office.   Veronda Prude, RN

## 2023-03-29 ENCOUNTER — Ambulatory Visit: Payer: 59 | Admitting: Student

## 2023-03-29 ENCOUNTER — Encounter: Payer: Self-pay | Admitting: Student

## 2023-03-29 VITALS — BP 136/59 | HR 72 | Ht 65.0 in | Wt 198.4 lb

## 2023-03-29 DIAGNOSIS — M545 Low back pain, unspecified: Secondary | ICD-10-CM | POA: Diagnosis not present

## 2023-03-29 DIAGNOSIS — G8929 Other chronic pain: Secondary | ICD-10-CM | POA: Diagnosis not present

## 2023-03-29 MED ORDER — TRAMADOL HCL 50 MG PO TABS: 50.0000 mg | ORAL_TABLET | Freq: Three times a day (TID) | ORAL | 0 refills | Status: AC | PRN

## 2023-03-29 NOTE — Patient Instructions (Signed)
 It was great to see you! Thank you for allowing me to participate in your care!   I recommend that you always bring your medications to each appointment as this makes it easy to ensure we are on the correct medications and helps us  not miss when refills are needed.  Our plans for today:  - You can take Tramadol  every 8 hours for pain, I have provided a short term 5 day supply. - You can also take 1000 mg of tylenol  every 6 hours for pain - Please call [phone] to schedule an appointment to see your orthopedic surgeon. - I have sent a referral to pain management   Take care and seek immediate care sooner if you develop any concerns. Please remember to show up 15 minutes before your scheduled appointment time!  Gladis Church, DO [facility]

## 2023-03-29 NOTE — Progress Notes (Signed)
   SUBJECTIVE:   CHIEF COMPLAINT / HPI: ED follow-up  ED Follow-up Patient presented to the ED on 03/16/2023 with vaginal pruritus and paresthesias in her legs with bilateral leg edema.  Workup in ED was unremarkable, including troponins and negative chest x-ray.  A wet prep on 02/19/2023 in urgent care, was following hemodialysis however she never received treatment.  She was given a dose of Diflucan  in the ED, and discharged with an additional Diflucan  dose.  She was also prescribed hydrochlorothiazide  for hypertension, in addition to her amlodipine -which multiple providers believe may related to her leg swelling.  Lastly, she was given gabapentin  for neuropathy by ED provider on 12/28.  Today patient is concerned about her left lower back pain.  She has acutely worsening low back pain.  On chart review, this pain has been often described as left flank pain, but with speaking patient this is most consistent with her left lower back pain.  Pain does extend to the flank and hip, however originates from her left lower back.  On review of chart, she does have known moderate lumbar scoliosis as well as osteoarthritis of her knees.  Patient is requesting medication for pain, tramadol  helped her in the past.  She states Tylenol  is not helpful, and she is unable to afford OTC lidocaine  patches and Voltaren  gel.  She is not interested in physical therapy at this time.  She is agreeable to seeing orthopedic surgery or PM&R.  No red flag symptoms.  OBJECTIVE:   BP (!) 136/59   Pulse 72   Ht 5' 5 (1.651 m)   Wt 198 lb 6 oz (90 kg)   SpO2 100%   BMI 33.01 kg/m    General: NAD, pleasant Cardio: RRR, no MRG. Cap Refill <2s. Respiratory: CTAB, normal wob on RA MSK: No gross deformity, no ecchymosis, no swelling of lower back.  TTP of left lower back.  Additionally, TTP diffusely over bilateral hips.  Limited range of motion of left hip.  Overall limited exam due to pain. Skin: Warm and  dry  ASSESSMENT/PLAN:   Assessment & Plan Chronic left-sided low back pain without sciatica Reviewed recent laboratory work, low concern for urinary tract infection, acute renal disease, kidney stone as source of left lower back pain.  Has been seen for this complaint as far back as 2020, possibly earlier. CT abdomen pelvis from April, unremarkable.  Known moderate lumbar scoliosis, and osteoarthritis of knees.  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  Vitals are stable, and there are no red flag symptoms to suggest malignant or CNS pathology as cause.  Based on history, chart review and exam above suspect this pain is related to MSK pathology and recommend physical therapy as well as orthopedic/PM&R referral.  Patient politely declined physical therapy.  She is agreeable to seeing orthopedics, number provided for Ortho care who she saw before.  Given the acute worsening of her chronic back pain, will prescribe short course of tramadol .  Relative contraindication NSAIDs due to CKD.  Patient states she is unable to afford over-the-counter medications including Voltaren  gel and lidocaine  patches. - Tramadol  50 mg every 8 hours as needed (5-day supply) - Patient instructed to follow-up with her orthopedic surgeon - Referral to PM&R  Gladis Church, DO [facility]

## 2023-03-29 NOTE — Assessment & Plan Note (Deleted)
 Reviewed recent laboratory work, low concern for urinary tract infection, acute renal disease, kidney stone as source of left lower back pain.  Has been seen for this complaint as far back as 2020, possibly earlier. CT abdomen pelvis from April, unremarkable.  Known moderate lumbar scoliosis, and osteoarthritis of knees.  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  Vitals are stable, and there are no red flag symptoms to suggest malignant or CNS pathology as cause.  Based on history, chart review and exam above suspect this pain is related to MSK pathology and recommend physical therapy as well as orthopedic/PM&R referral.  Patient politely declined physical therapy.  She is agreeable to seeing orthopedics, number provided for Ortho care who she saw before.  Given the acute worsening of her chronic back pain, will prescribe short course of tramadol .  Relative contraindication NSAIDs due to CKD.  Patient states she is unable to afford over-the-counter medications including Voltaren  gel and lidocaine  patches. - Tramadol  50 mg every 8 hours as needed (5-day supply) - Patient instructed to follow-up with her orthopedic surgeon - Referral to PM&R

## 2023-04-05 DIAGNOSIS — M25562 Pain in left knee: Secondary | ICD-10-CM | POA: Diagnosis not present

## 2023-04-05 DIAGNOSIS — M170 Bilateral primary osteoarthritis of knee: Secondary | ICD-10-CM | POA: Diagnosis not present

## 2023-04-05 DIAGNOSIS — M25561 Pain in right knee: Secondary | ICD-10-CM | POA: Diagnosis not present

## 2023-04-05 DIAGNOSIS — G8929 Other chronic pain: Secondary | ICD-10-CM | POA: Diagnosis not present

## 2023-04-09 ENCOUNTER — Ambulatory Visit
Admission: RE | Admit: 2023-04-09 | Discharge: 2023-04-09 | Disposition: A | Discharge status: Home / Self Care | Payer: 59 | Source: Ambulatory Visit

## 2023-04-09 DIAGNOSIS — Z1231 Encounter for screening mammogram for malignant neoplasm of breast: Secondary | ICD-10-CM | POA: Diagnosis not present

## 2023-04-11 ENCOUNTER — Other Ambulatory Visit: Payer: Self-pay | Admitting: Student

## 2023-04-11 DIAGNOSIS — M25551 Pain in right hip: Secondary | ICD-10-CM

## 2023-04-13 ENCOUNTER — Encounter: Payer: Self-pay | Admitting: Physical Medicine & Rehabilitation

## 2023-04-28 ENCOUNTER — Other Ambulatory Visit: Payer: Self-pay | Admitting: Cardiology

## 2023-04-28 ENCOUNTER — Other Ambulatory Visit: Payer: Self-pay | Admitting: Student

## 2023-04-28 DIAGNOSIS — I10 Essential (primary) hypertension: Secondary | ICD-10-CM

## 2023-05-04 ENCOUNTER — Telehealth: Payer: Self-pay | Admitting: Cardiology

## 2023-05-04 ENCOUNTER — Encounter: Payer: Self-pay | Admitting: Cardiology

## 2023-05-04 NOTE — Telephone Encounter (Signed)
 Called patient with no answer. Left message to return call.

## 2023-05-04 NOTE — Telephone Encounter (Signed)
 Error

## 2023-05-04 NOTE — Telephone Encounter (Signed)
 Patient is returning call and is requesting call back.

## 2023-05-04 NOTE — Telephone Encounter (Signed)
 Patient wants a call back from RN Latonia regarding questions about her upcoming procedure.

## 2023-05-07 NOTE — Telephone Encounter (Signed)
 Patient identification verified by 2 forms. Hilton Lucky, RN    Called and spoke to patient  Patient states:   -does not know why she needs to do a sleep study  Informed patient:   -per 11/1 OV Dr. Alda Amas placed referral due to previously being on CPAP but machine sent back to DME company   -new referral sent likely to re enroll in getting CPAP machine   -she will be outreached regarding referral  Patient verbalized understanding, no questions at this time

## 2023-05-13 ENCOUNTER — Other Ambulatory Visit: Payer: Self-pay | Admitting: Student

## 2023-05-14 ENCOUNTER — Telehealth: Payer: Self-pay | Admitting: Student

## 2023-05-18 ENCOUNTER — Encounter: Payer: 59 | Admitting: Physical Medicine & Rehabilitation

## 2023-05-21 ENCOUNTER — Encounter: Payer: Self-pay | Admitting: Podiatry

## 2023-05-21 ENCOUNTER — Ambulatory Visit (INDEPENDENT_AMBULATORY_CARE_PROVIDER_SITE_OTHER): Payer: 59 | Admitting: Podiatry

## 2023-05-21 DIAGNOSIS — M79675 Pain in left toe(s): Secondary | ICD-10-CM | POA: Diagnosis not present

## 2023-05-21 DIAGNOSIS — M79674 Pain in right toe(s): Secondary | ICD-10-CM | POA: Diagnosis not present

## 2023-05-21 DIAGNOSIS — B351 Tinea unguium: Secondary | ICD-10-CM | POA: Diagnosis not present

## 2023-05-21 NOTE — Progress Notes (Signed)

## 2023-06-12 ENCOUNTER — Ambulatory Visit (INDEPENDENT_AMBULATORY_CARE_PROVIDER_SITE_OTHER): Admitting: Family Medicine

## 2023-06-12 ENCOUNTER — Encounter: Payer: Self-pay | Admitting: Family Medicine

## 2023-06-12 VITALS — BP 170/81 | HR 66 | Ht 65.0 in | Wt 195.8 lb

## 2023-06-12 DIAGNOSIS — G8929 Other chronic pain: Secondary | ICD-10-CM | POA: Diagnosis not present

## 2023-06-12 DIAGNOSIS — N898 Other specified noninflammatory disorders of vagina: Secondary | ICD-10-CM | POA: Diagnosis not present

## 2023-06-12 DIAGNOSIS — I10 Essential (primary) hypertension: Secondary | ICD-10-CM | POA: Diagnosis not present

## 2023-06-12 DIAGNOSIS — M79604 Pain in right leg: Secondary | ICD-10-CM

## 2023-06-12 DIAGNOSIS — M79605 Pain in left leg: Secondary | ICD-10-CM

## 2023-06-12 DIAGNOSIS — M545 Low back pain, unspecified: Secondary | ICD-10-CM | POA: Diagnosis not present

## 2023-06-12 MED ORDER — PREDNISONE 20 MG PO TABS: 40.0000 mg | ORAL_TABLET | Freq: Every day | ORAL | 0 refills | Status: DC

## 2023-06-12 NOTE — Patient Instructions (Signed)
 Good to see you today - Thank you for coming in  Things we discussed today:  1) For your pain, it is most likely due to irritation of your nerves and chronic degeneration of your spine muscles.  - Take prednisone 40mg  once a day for 5 days - Come back in 1 week with Dr. Elliot Gurney to discuss starting venlafaxine. This can help with chronic pain. We will wait today since your blood pressure is high. - Follow-up with your Orthopedic doctor. You may need physical therapy.  2) I sending a referral for you to see the OB/GYN (women's care) doctors. They can help with your vaginal itch.  3) Your blood pressure was very high today. Please come back in 1 week with your PCP to recheck this.

## 2023-06-12 NOTE — Progress Notes (Unsigned)
    SUBJECTIVE:   CHIEF COMPLAINT / HPI:   [name] is a 73yo F w/ hx of HTN, lumbar degenerative disorder, BL knee OA that p/f acute exacerbation of BL leg pain.   Leg Pain - Feels like she has an acute exacerbation of pain in her BL legs. Also has pressure and pain in her L hip and lower back.  - Pt asks if tramadol can be used for this pain.   Per chart review: - Seen 1/2 for chronic low back pain, sciatica, and hip pain, treated with short course of Tramadol. Seen by Sports Med 1/9, got L knee steroid injection.   Vaginal itching - Did not improve with fluconazole. - Also used a boric acid suppository.  - Reports also using a "vaginal itch ointment", but unsure what it is. - OBGYN  HTN Reports taking all her HTN meds today but says her pain is so uncontrolled that it may be causing her BP to rise.    OBJECTIVE:   BP (!) 170/81   Pulse 66   Ht 5\' 5"  (1.651 m)   Wt 195 lb 12.8 oz (88.8 kg)   SpO2 99%   BMI 32.58 kg/m   General: Alert, pleasant well-appearing woman. NAD. HEENT: NCAT. MMM. CV: RRR, no murmurs. Cap refill <2. Resp: CTAB, no wheezing or crackles. Normal WOB on RA.  Abm: Soft, nontender, nondistended. BS present. Ext: Trace edema in bilateral lower extremity.  Bilateral lower extremities appear symmetric, no swelling, no erythema.  No tenderness to palpation.  Regular gait   ASSESSMENT/PLAN:   Assessment & Plan Pain in both lower extremities Differential includes acute exacerbation of chronic degenerative disc disease and chronic back pain, exacerbation of BL knee OA. Had L knee steroid injection 1/9 w/ sports medicine.  Counseled that tramadol is not an effective long-term medication for this type of pain.  Encouraged to continue with Tylenol and gabapentin. -5-day course of prednisone 40 mg daily -Advised to follow-up with her orthopedic surgeon -Follow-up in 1 week with PCP.  Could consider starting SNRI for neck pain. Vaginal itching Reports persistent  vaginal itching despite fluconazole and boric acid suppository.  It looks like patient was previously referred to OB/GYN for the same issue but patient did not follow-up.  Will resend referral for OB/GYN. Primary hypertension Above goal despite recheck.  Reports adherence to home amlodipine, Coreg, hydrochlorothiazide, Imdur, losartan.  Feels like her pain is causing her blood pressure to be higher today.  Shared decision making with patient, will wait to make medication changes and follow-up in 1 week with PCP.   - If still elevated at that time, consider increasing amlodipine to 10 mg    Lincoln Brigham, MD [facility]

## 2023-06-14 NOTE — Assessment & Plan Note (Signed)
 Above goal despite recheck.  Reports adherence to home amlodipine, Coreg, hydrochlorothiazide, Imdur, losartan.  Feels like her pain is causing her blood pressure to be higher today.  Shared decision making with patient, will wait to make medication changes and follow-up in 1 week with PCP.   - If still elevated at that time, consider increasing amlodipine to 10 mg

## 2023-06-14 NOTE — Assessment & Plan Note (Signed)
 Reports persistent vaginal itching despite fluconazole and boric acid suppository.  It looks like patient was previously referred to OB/GYN for the same issue but patient did not follow-up.  Will resend referral for OB/GYN.

## 2023-06-18 ENCOUNTER — Ambulatory Visit (HOSPITAL_COMMUNITY): Admission: EM | Admit: 2023-06-18 | Discharge: 2023-06-18 | Disposition: A | Discharge status: Home / Self Care

## 2023-06-18 ENCOUNTER — Encounter (HOSPITAL_COMMUNITY): Payer: Self-pay

## 2023-06-18 DIAGNOSIS — N760 Acute vaginitis: Secondary | ICD-10-CM | POA: Insufficient documentation

## 2023-06-18 DIAGNOSIS — J101 Influenza due to other identified influenza virus with other respiratory manifestations: Secondary | ICD-10-CM | POA: Insufficient documentation

## 2023-06-18 LAB — COMPREHENSIVE METABOLIC PANEL
ALT: 22 U/L (ref 0–44)
AST: 22 U/L (ref 15–41)
Albumin: 3.4 g/dL — ABNORMAL LOW (ref 3.5–5.0)
Alkaline Phosphatase: 81 U/L (ref 38–126)
Anion gap: 9 (ref 5–15)
BUN: 17 mg/dL (ref 8–23)
CO2: 24 mmol/L (ref 22–32)
Calcium: 8.9 mg/dL (ref 8.9–10.3)
Chloride: 106 mmol/L (ref 98–111)
Creatinine, Ser: 1.04 mg/dL — ABNORMAL HIGH (ref 0.44–1.00)
GFR, Estimated: 57 mL/min — ABNORMAL LOW (ref >60)
Glucose, Bld: 213 mg/dL — ABNORMAL HIGH (ref 70–99)
Potassium: 3.5 mmol/L (ref 3.5–5.1)
Sodium: 139 mmol/L (ref 135–145)
Total Bilirubin: 0.6 mg/dL (ref 0.0–1.2)
Total Protein: 7.1 g/dL (ref 6.5–8.1)

## 2023-06-18 LAB — POCT URINALYSIS DIP (MANUAL ENTRY)
Bilirubin, UA: NEGATIVE
Blood, UA: NEGATIVE
Glucose, UA: NEGATIVE mg/dL
Ketones, POC UA: NEGATIVE mg/dL
Leukocytes, UA: NEGATIVE
Nitrite, UA: NEGATIVE
Protein Ur, POC: NEGATIVE mg/dL
Spec Grav, UA: 1.01 (ref 1.010–1.025)
Urobilinogen, UA: 0.2 U/dL
pH, UA: 5.5 (ref 5.0–8.0)

## 2023-06-18 LAB — CBC
HCT: 39.5 % (ref 36.0–46.0)
Hemoglobin: 12.7 g/dL (ref 12.0–15.0)
MCH: 31.8 pg (ref 26.0–34.0)
MCHC: 32.2 g/dL (ref 30.0–36.0)
MCV: 98.8 fL (ref 80.0–100.0)
Platelets: 297 10*3/uL (ref 150–400)
RBC: 4 MIL/uL (ref 3.87–5.11)
RDW: 14.5 % (ref 11.5–15.5)
WBC: 10.7 10*3/uL — ABNORMAL HIGH (ref 4.0–10.5)
nRBC: 0 % (ref 0.0–0.2)

## 2023-06-18 LAB — POC COVID19/FLU A&B COMBO
Covid Antigen, POC: NEGATIVE
Influenza A Antigen, POC: POSITIVE — AB
Influenza B Antigen, POC: NEGATIVE

## 2023-06-18 MED ORDER — AZELASTINE HCL 0.1 % NA SOLN: 1.0000 | Freq: Two times a day (BID) | NASAL | 1 refills | Status: AC

## 2023-06-18 MED ORDER — OSELTAMIVIR PHOSPHATE 45 MG PO CAPS: 45.0000 mg | ORAL_CAPSULE | Freq: Two times a day (BID) | ORAL | 0 refills | Status: AC

## 2023-06-18 NOTE — ED Triage Notes (Signed)
 Patient here today with c/o cough, chills, body aches, and nasal congestion X 3 days. She has been taking Tylenol with no relief. Her son has the flu.

## 2023-06-18 NOTE — ED Provider Notes (Signed)
 [facility]  Note:  This document was prepared using Dragon voice recognition software and may include unintentional dictation errors.  MRN: [id number] DOB: 1950/11/17  Subjective:   [name] is a 73 y.o. female presenting for cough, chills, body aches, nasal congestion x 3 days.  Patient states that she has been taking Tylenol with no relief.  Her son was recently diagnosed with influenza.  Patient is concern for having COVID or flu.  Patient also reports mild vaginal itching and irritation, no burning with urination or increased urinary frequency.  Patient would like her urine checked and blood drawn to make sure she does not have serious infection.  No current facility-administered medications for this encounter.  Current Outpatient Medications:    azelastine (ASTELIN) 0.1 % nasal spray, Place 1 spray into both nostrils 2 (two) times daily. Use in each nostril as directed, Disp: 30 mL, Rfl: 1   oseltamivir (TAMIFLU) 45 MG capsule, Take 1 capsule (45 mg total) by mouth 2 (two) times daily for 5 days., Disp: 10 capsule, Rfl: 0   amLODipine (NORVASC) 5 MG tablet, TAKE 1 TABLET(5 MG) BY MOUTH DAILY, Disp: 90 tablet, Rfl: 0   aspirin EC 81 MG tablet, Take 1 tablet (81 mg total) daily by mouth., Disp: 30 tablet, Rfl: 11   atorvastatin (LIPITOR) 80 MG tablet, TAKE 1 TABLET(80 MG) BY MOUTH DAILY AT 6 PM, Disp: 90 tablet, Rfl: 3   Capsaicin-Menthol-Methyl Sal (CAPSAICIN-METHYL SAL-MENTHOL) 0.025-1-12 % CREA, Apply 1 Tube topically daily. (Patient not taking: Reported on 06/18/2023), Disp: 56.6 g, Rfl: 0   carvedilol (COREG) 12.5 MG tablet, TAKE 1 TABLET(12.5 MG) BY MOUTH TWICE DAILY WITH A MEAL, Disp: 60 tablet, Rfl: 2   ciclopirox (PENLAC) 8 % solution, Apply topically at bedtime. Apply over nail and surrounding skin. Apply daily over previous coat. After seven (7) days, may remove with alcohol and continue cycle. (Patient not taking: Reported on 06/18/2023), Disp: 6.6 mL, Rfl: 0    ezetimibe (ZETIA) 10 MG tablet, Take 1 tablet (10 mg total) by mouth daily., Disp: 90 tablet, Rfl: 3   fluticasone (FLONASE) 50 MCG/ACT nasal spray, SHAKE LIQUID AND USE 2 SPRAYS IN EACH NOSTRIL TWICE DAILY, Disp: 16 g, Rfl: 1   furosemide (LASIX) 40 MG tablet, Take 1 tablet (40 mg total) by mouth daily., Disp: 30 tablet, Rfl: 9   gabapentin (NEURONTIN) 300 MG capsule, Take 1 capsule (300 mg total) by mouth 2 (two) times daily., Disp: 30 capsule, Rfl: 0   hydrochlorothiazide (HYDRODIURIL) 25 MG tablet, Take 1 tablet (25 mg total) by mouth daily., Disp: 30 tablet, Rfl: 0   isosorbide mononitrate (IMDUR) 60 MG 24 hr tablet, TAKE 1 TABLET(60 MG) BY MOUTH DAILY, Disp: 90 tablet, Rfl: 3   ketoconazole (NIZORAL) 2 % cream, Apply 1 Application topically as needed., Disp: , Rfl:    ketoconazole (NIZORAL) 2 % shampoo, Apply 1 Application topically as needed., Disp: , Rfl:    losartan (COZAAR) 100 MG tablet, TAKE 1 TABLET(100 MG) BY MOUTH DAILY, Disp: 90 tablet, Rfl: 3   Multiple Vitamin (MULITIVITAMIN WITH MINERALS) TABS, Take 1 tablet by mouth daily., Disp: , Rfl:    nitroGLYCERIN (NITROSTAT) 0.4 MG SL tablet, PLACE 1 TABLET UNDER THE TONGUE EVERY 5 MINUTES AS NEEDED., Disp: 25 tablet, Rfl: 11   potassium chloride SA (KLOR-CON M) 20 MEQ tablet, TAKE 1 TABLET(20 MEQ) BY MOUTH TWICE DAILY, Disp: 180 tablet, Rfl: 0   predniSONE (DELTASONE) 20 MG tablet, Take 2 tablets (40 mg  total) by mouth daily., Disp: 5 tablet, Rfl: 0   Allergies  Allergen Reactions   Shrimp [Shellfish Allergy] Hives and Itching   Latex Itching   Penicillins Itching and Rash    Has patient had a PCN reaction causing immediate rash, facial/tongue/throat swelling, SOB or lightheadedness with hypotension: unknown Has patient had a PCN reaction causing severe rash involving mucus membranes or skin necrosis: unknown Has patient had a PCN reaction that required hospitalization unknown Has patient had a PCN reaction occurring within the last  10 years: No If all of the above answers are "NO", then may proceed with Cephalosporin use.    Past Medical History:  Diagnosis Date   Abnormal EKG 2012   hospitalized for T wave inversion in lateral leads with MSK chest pains, normal ECHO, negative trops, no cardiology consult. recent EKG 7/15 stable T wave inversions.    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   Arthritis 2000   Arthritis 2011   Bilateral leg pain 06/14/2015   Clotting disorder (HCC)    blood clot kidney 2017   Heart murmur    Hyperlipidemia 2011   Hypertension 2011   Left elbow pain 03/31/2020   Meniscus tear 2000   L KNEE   Pelvic pain 10/07/2018   Renal infarct (HCC) 2017   blood clot in the kidney     Past Surgical History:  Procedure Laterality Date   ABDOMINAL SURGERY  2010   for bowel blockage   COLON SURGERY  2010   bowel blockage   COLONOSCOPY  09/2014   HYSTEROSCOPY WITH D & C N/A 05/08/2018   Procedure: DILATATION AND CURETTAGE /HYSTEROSCOPY;  Surgeon: Catalina Antigua, MD;  Location: [facility];  Service: Gynecology;  Laterality: N/A;   KNEE ARTHROSCOPY Left 09/25/2013   Procedure: LEFT KNEE ARTHROSCOPY WITH PARTIAL MEDIAL AND LATERAL  MENISCECTOMY/DEBRIDEMENT/MICRO FRACTURE MEDIAL FEMORAL CONDYLE, REMOVAL OF OSTEOCONDRAL FRAGMENT;  Surgeon: Javier Docker, MD;  Location: [facility];  Service: Orthopedics;  Laterality: Left;   KNEE SURGERY Right 1999   LEFT HEART CATH AND CORONARY ANGIOGRAPHY N/A 12/13/2020   Procedure: LEFT HEART CATH AND CORONARY ANGIOGRAPHY;  Surgeon: Yvonne Kendall, MD;  Location: [facility];  Service: Cardiovascular;  Laterality: N/A;   POLYPECTOMY     TEE WITHOUT CARDIOVERSION N/A 10/04/2015   Procedure: TRANSESOPHAGEAL ECHOCARDIOGRAM (TEE);  Surgeon: Quintella Reichert, MD;  Location[facility];  Service: Cardiovascular;  Laterality: N/A;   TUBAL LIGATION  1978    Family History  Problem Relation Age of Onset   Hypertension Mother    Heart disease Mother         has a pacemaker    Alzheimer's disease Mother    Hyperlipidemia Mother    Osteoporosis Mother    Heart failure Father    Hypertension Father    Alzheimer's disease Father    Hypertension Sister    Breast cancer Sister    Hypertension Sister    Gout Sister    Heart disease Son    Alcohol abuse Son    Pancreatitis Son    Hypertension Son    Cancer Neg Hx    Colon cancer Neg Hx    Esophageal cancer Neg Hx    Stomach cancer Neg Hx    Rectal cancer Neg Hx     Social History   Tobacco Use   Smoking status: Former    Current packs/day: 0.00    Types: Cigarettes    Quit date: 07/29/2010  Years since quitting: 12.8    Passive exposure: Past   Smokeless tobacco: Never  Vaping Use   Vaping status: Never Used  Substance Use Topics   Alcohol use: Not Currently   Drug use: Never    ROS Refer to HPI for ROS details.  Objective:   Vitals: BP (!) 175/73 (BP Location: Left Arm)   Pulse 73   Temp 98.5 F (36.9 C) (Oral)   Resp 16   SpO2 93%   Physical Exam Vitals and nursing note reviewed.  Constitutional:      General: She is not in acute distress.    Appearance: Normal appearance. She is well-developed. She is not ill-appearing or toxic-appearing.  HENT:     Head: Normocephalic.     Nose: Congestion present. No rhinorrhea.  Eyes:     Conjunctiva/sclera: Conjunctivae normal.  Cardiovascular:     Rate and Rhythm: Normal rate.  Pulmonary:     Effort: Pulmonary effort is normal. No respiratory distress.  Skin:    General: Skin is warm and dry.  Neurological:     General: No focal deficit present.     Mental Status: She is alert and oriented to person, place, and time.  Psychiatric:        Mood and Affect: Mood normal.     Procedures  Results for orders placed or performed during the hospital encounter of 06/18/23 (from the past 24 hours)  CBC     Status: Abnormal   Collection Time: 06/18/23  4:18 PM  Result Value Ref Range   WBC 10.7 (H) 4.0 - 10.5  K/uL   RBC 4.00 3.87 - 5.11 MIL/uL   Hemoglobin 12.7 12.0 - 15.0 g/dL   HCT 16.1 09.6 - 04.5 %   MCV 98.8 80.0 - 100.0 fL   MCH 31.8 26.0 - 34.0 pg   MCHC 32.2 30.0 - 36.0 g/dL   RDW 40.9 81.1 - 91.4 %   Platelets 297 150 - 400 K/uL   nRBC 0.0 0.0 - 0.2 %  POC Covid19/Flu A&B Antigen     Status: Abnormal   Collection Time: 06/18/23  4:26 PM  Result Value Ref Range   Influenza A Antigen, POC Positive (A) Negative   Influenza B Antigen, POC Negative Negative   Covid Antigen, POC Negative Negative  POC urinalysis dipstick     Status: None   Collection Time: 06/18/23  4:43 PM  Result Value Ref Range   Color, UA yellow yellow   Clarity, UA clear clear   Glucose, UA negative negative mg/dL   Bilirubin, UA negative negative   Ketones, POC UA negative negative mg/dL   Spec Grav, UA 7.829 5.621 - 1.025   Blood, UA negative negative   pH, UA 5.5 5.0 - 8.0   Protein Ur, POC negative negative mg/dL   Urobilinogen, UA 0.2 0.2 or 1.0 E.U./dL   Nitrite, UA Negative Negative   Leukocytes, UA Negative Negative    Assessment and Plan :   PDMP not reviewed this encounter.  1. Influenza A   2. Acute vaginitis    1. Influenza A (Primary) - POC Covid19/Flu A&B Antigen completed in lab shows positive for influenza A, negative for COVID - CBC & Comprehensive metabolic panel collected in clinic and sent to lab - oseltamivir (TAMIFLU) 45 MG capsule; Take 1 capsule (45 mg total) by mouth 2 (two) times daily for 5 days.  Dispense: 10 capsule; Refill: 0 - azelastine (ASTELIN) 0.1 % nasal spray; Place 1 spray  into both nostrils 2 (two) times daily. Use in each nostril as directed  Dispense: 30 mL; Refill: 1  2. Acute vaginitis - Cervicovaginal collected in UC and sent to lab for further testing - POC urinalysis dipstick completed in UC shows no leukocytes, no nitrite, no blood, no sign of urinary tract infection. -Continue to monitor symptoms for any change in severity if there is any escalation  of current symptoms or development of new symptoms follow-up for further evaluation and management.  Lucky Cowboy   [place], [place], [place] 06/18/23 1724

## 2023-06-18 NOTE — Discharge Instructions (Signed)
 1. Influenza A (Primary) - POC Covid19/Flu A&B Antigen completed in lab shows positive for influenza A, negative for COVID - CBC & Comprehensive metabolic panel collected in clinic and sent to lab - oseltamivir (TAMIFLU) 45 MG capsule; Take 1 capsule (45 mg total) by mouth 2 (two) times daily for 5 days.  Dispense: 10 capsule; Refill: 0 - azelastine (ASTELIN) 0.1 % nasal spray; Place 1 spray into both nostrils 2 (two) times daily. Use in each nostril as directed  Dispense: 30 mL; Refill: 1  2. Acute vaginitis - Cervicovaginal collected in UC and sent to lab for further testing - POC urinalysis dipstick completed in UC shows no leukocytes, no nitrite, no blood, no sign of urinary tract infection. -Continue to monitor symptoms for any change in severity if there is any escalation of current symptoms or development of new symptoms follow-up for further evaluation and management.

## 2023-06-19 ENCOUNTER — Encounter (HOSPITAL_COMMUNITY): Payer: Self-pay

## 2023-06-19 DIAGNOSIS — B3731 Acute candidiasis of vulva and vagina: Secondary | ICD-10-CM

## 2023-06-19 LAB — CERVICOVAGINAL ANCILLARY ONLY
Bacterial Vaginitis (gardnerella): NEGATIVE
Candida Glabrata: POSITIVE — AB
Candida Vaginitis: NEGATIVE
Comment: NEGATIVE
Comment: NEGATIVE
Comment: NEGATIVE
Comment: NEGATIVE
Trichomonas: NEGATIVE

## 2023-06-19 MED ORDER — FLUCONAZOLE 150 MG PO TABS: 150.0000 mg | ORAL_TABLET | Freq: Every day | ORAL | 0 refills | Status: DC

## 2023-06-19 NOTE — Telephone Encounter (Signed)
-----   Message from Nurse Harlow Ohms sent at 06/19/2023  4:36 PM EDT ----- Hello, there are no protocol meds for Glabrata. Please advise regarding treatment. Thanks

## 2023-07-05 ENCOUNTER — Encounter: Payer: Self-pay | Admitting: Cardiology

## 2023-07-05 ENCOUNTER — Ambulatory Visit: Payer: 59 | Attending: Cardiology | Admitting: Cardiology

## 2023-07-05 VITALS — BP 152/60 | HR 78 | Ht 65.0 in | Wt 195.0 lb

## 2023-07-05 DIAGNOSIS — I5032 Chronic diastolic (congestive) heart failure: Secondary | ICD-10-CM

## 2023-07-05 DIAGNOSIS — G4733 Obstructive sleep apnea (adult) (pediatric): Secondary | ICD-10-CM | POA: Diagnosis not present

## 2023-07-05 DIAGNOSIS — E785 Hyperlipidemia, unspecified: Secondary | ICD-10-CM | POA: Diagnosis not present

## 2023-07-05 DIAGNOSIS — I10 Essential (primary) hypertension: Secondary | ICD-10-CM | POA: Diagnosis not present

## 2023-07-05 DIAGNOSIS — I2510 Atherosclerotic heart disease of native coronary artery without angina pectoris: Secondary | ICD-10-CM

## 2023-07-05 NOTE — Progress Notes (Signed)
 Cardiology Office Note:    Date:  07/05/2023   ID:  [name], DOB 1950-05-06, MRN [id number]  PCP:  Jerre Simon, MD  Cardiologist:  Little Ishikawa, MD  Electrophysiologist:  None   Referring MD: Jerre Simon, MD   Chief Complaint  Patient presents with   Coronary Artery Disease    History of Present Illness:    [name] is a 73 y.o. female with a hx of CAD, hypertension, hyperlipidemia, renal infarct, ?aortic valve vegetation who presents for follow-up.  She was referred by Dr. Leveda Anna for evaluation of chest pain, initially seen 11/18/2020.  She was admitted to [facility] from 7/6 through 10/04/2015 with lower abdominal pain.  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  She was started on heparin drip.  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  Blood cultures were negative.  She was started on anticoagulation with warfarin.  Echo 10/01/2015 showed EF 65 to 70%, grade 1 diastolic dysfunction.  She completed 6 months of anticoagulation and was then transitioned to aspirin 81 mg daily.  No further episodes.  Coronary CTA on 11/26/2020 showed moderate CAD in mid and distal RCA, calcium score 936 (97th percentile).  CT FFR suggested total occlusion in mid to distal RCA and indeterminate FFR value in distal LAD that may be due to vessel tapering of small caliber distal vessel stenosis.  Cath 12/13/2020 showed chronic subtotal (99%) occlusion of distal RCA with left-to-right collaterals, mild proximal LAD disease.  Zio patch x14 days 11/15/2021 showed no significant arrhythmias.  Since last clinic visit, she reports he is doing well.  Denies any chest pain, dyspnea, lightheadedness, syncope, or palpitations.  Does report some left lower extremity edema.  She does stretching and leg exercises each day and walks 2 days/week for 30 minutes.  Denies exertional symptoms.    BP Readings from Last  3 Encounters:  07/05/23 (!) 152/60  06/18/23 (!) 175/73  06/12/23 (!) 170/81   Wt Readings from Last 3 Encounters:  07/05/23 195 lb (88.5 kg)  06/12/23 195 lb 12.8 oz (88.8 kg)  03/29/23 198 lb 6 oz (90 kg)      Past Medical History:  Diagnosis Date   Abnormal EKG 2012   hospitalized for T wave inversion in lateral leads with MSK chest pains, normal ECHO, negative trops, no cardiology consult. recent EKG 7/15 stable T wave inversions.    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   Arthritis 2000   Arthritis 2011   Bilateral leg pain 06/14/2015   Clotting disorder (HCC)    blood clot kidney 2017   Heart murmur    Hyperlipidemia 2011   Hypertension 2011   Left elbow pain 03/31/2020   Meniscus tear 2000   L KNEE   Pelvic pain 10/07/2018   Renal infarct (HCC) 2017   blood clot in the kidney    Past Surgical History:  Procedure Laterality Date   ABDOMINAL SURGERY  2010   for bowel blockage   COLON SURGERY  2010   bowel blockage   COLONOSCOPY  09/2014   HYSTEROSCOPY WITH D & C N/A 05/08/2018   Procedure: DILATATION AND CURETTAGE /HYSTEROSCOPY;  Surgeon: Catalina Antigua, MD;  Location: [facility];  Service: Gynecology;  Laterality: N/A;   KNEE ARTHROSCOPY Left 09/25/2013   Procedure: LEFT KNEE ARTHROSCOPY WITH PARTIAL MEDIAL AND LATERAL  MENISCECTOMY/DEBRIDEMENT/MICRO FRACTURE MEDIAL FEMORAL CONDYLE, REMOVAL OF OSTEOCONDRAL FRAGMENT;  Surgeon: Lacretia Leigh  Shelle Iron, MD;  Location: [facility];  Service: Orthopedics;  Laterality: Left;   KNEE SURGERY Right 1999   LEFT HEART CATH AND CORONARY ANGIOGRAPHY N/A 12/13/2020   Procedure: LEFT HEART CATH AND CORONARY ANGIOGRAPHY;  Surgeon: Yvonne Kendall, MD;  Location: [facility];  Service: Cardiovascular;  Laterality: N/A;   POLYPECTOMY     TEE WITHOUT CARDIOVERSION N/A 10/04/2015   Procedure: TRANSESOPHAGEAL ECHOCARDIOGRAM (TEE);  Surgeon: Quintella Reichert, MD;  Location: [facility];  Service: Cardiovascular;  Laterality: N/A;    TUBAL LIGATION  1978    Current Medications: Current Meds  Medication Sig   amLODipine (NORVASC) 5 MG tablet TAKE 1 TABLET(5 MG) BY MOUTH DAILY   aspirin EC 81 MG tablet Take 1 tablet (81 mg total) daily by mouth.   atorvastatin (LIPITOR) 80 MG tablet TAKE 1 TABLET(80 MG) BY MOUTH DAILY AT 6 PM   azelastine (ASTELIN) 0.1 % nasal spray Place 1 spray into both nostrils 2 (two) times daily. Use in each nostril as directed   Capsaicin-Menthol-Methyl Sal (CAPSAICIN-METHYL SAL-MENTHOL) 0.025-1-12 % CREA Apply 1 Tube topically daily.   carvedilol (COREG) 12.5 MG tablet TAKE 1 TABLET(12.5 MG) BY MOUTH TWICE DAILY WITH A MEAL   ciclopirox (PENLAC) 8 % solution Apply topically at bedtime. Apply over nail and surrounding skin. Apply daily over previous coat. After seven (7) days, may remove with alcohol and continue cycle.   fluconazole (DIFLUCAN) 150 MG tablet Take 1 tablet (150 mg total) by mouth daily. If symptoms persist take second dose of Diflucan 150 mg on day 3 to treat vaginal Candida.   fluticasone (FLONASE) 50 MCG/ACT nasal spray SHAKE LIQUID AND USE 2 SPRAYS IN EACH NOSTRIL TWICE DAILY   furosemide (LASIX) 40 MG tablet Take 1 tablet (40 mg total) by mouth daily.   gabapentin (NEURONTIN) 300 MG capsule Take 1 capsule (300 mg total) by mouth 2 (two) times daily.   hydrochlorothiazide (HYDRODIURIL) 25 MG tablet Take 1 tablet (25 mg total) by mouth daily.   isosorbide mononitrate (IMDUR) 60 MG 24 hr tablet TAKE 1 TABLET(60 MG) BY MOUTH DAILY   ketoconazole (NIZORAL) 2 % cream Apply 1 Application topically as needed.   ketoconazole (NIZORAL) 2 % shampoo Apply 1 Application topically as needed.   losartan (COZAAR) 100 MG tablet TAKE 1 TABLET(100 MG) BY MOUTH DAILY   Multiple Vitamin (MULITIVITAMIN WITH MINERALS) TABS Take 1 tablet by mouth daily.   nitroGLYCERIN (NITROSTAT) 0.4 MG SL tablet PLACE 1 TABLET UNDER THE TONGUE EVERY 5 MINUTES AS NEEDED.   potassium chloride SA (KLOR-CON M) 20 MEQ  tablet TAKE 1 TABLET(20 MEQ) BY MOUTH TWICE DAILY   predniSONE (DELTASONE) 20 MG tablet Take 2 tablets (40 mg total) by mouth daily.     Allergies:   Shrimp [shellfish allergy], Latex, and Penicillins   Social History   Socioeconomic History   Marital status: Single    Spouse name: Not on file   Number of children: 2   Years of education: 12   Highest education level: 12th grade  Occupational History   Occupation: Custodian    Employer: [employer]    Comment: RETIRED  Tobacco Use   Smoking status: Former    Current packs/day: 0.00    Types: Cigarettes    Quit date: 07/29/2010    Years since quitting: 12.9    Passive exposure: Past   Smokeless tobacco: Never  Vaping Use   Vaping status: Never Used  Substance and Sexual Activity  Alcohol use: Not Currently   Drug use: Never   Sexual activity: Not Currently    Birth control/protection: Surgical    Comment: 1st intercourse 73 yo-Fewer than 5 partners  Other Topics Concern   Not on file  Social History Narrative   Patient and her one of her sons live together in [place].   Patient has another son who resides in [place], [place].   Never married      Patient enjoys spending time at a senior recreational center. Senior activities and dinners.       Patient walks ~15 minutes per day.   Social Drivers of Corporate investment banker Strain: Low Risk  (09/05/2022)   Overall Financial Resource Strain (CARDIA)    Difficulty of Paying Living Expenses: Not hard at all  Food Insecurity: No Food Insecurity (09/05/2022)   Hunger Vital Sign    Worried About Running Out of Food in the Last Year: Never true    Ran Out of Food in the Last Year: Never true  Transportation Needs: No Transportation Needs (09/05/2022)   PRAPARE - Administrator, Civil Service (Medical): No    Lack of Transportation (Non-Medical): No  Physical Activity: Insufficiently Active (09/05/2022)   Exercise Vital Sign    Days of Exercise  per Week: 3 days    Minutes of Exercise per Session: 10 min  Stress: No Stress Concern Present (09/05/2022)   [employer] of Occupational Health - Occupational Stress Questionnaire    Feeling of Stress : Not at all  Social Connections: Moderately Integrated (09/05/2022)   Social Connection and Isolation Panel [NHANES]    Frequency of Communication with Friends and Family: More than three times a week    Frequency of Social Gatherings with Friends and Family: Twice a week    Attends Religious Services: More than 4 times per year    Active Member of [employer] or Organizations: Yes    Attends Engineer, structural: More than 4 times per year    Marital Status: Never married     Family History: The patient's family history includes Alcohol abuse in her son; Alzheimer's disease in her father and mother; Breast cancer in her sister; Gout in her sister; Heart disease in her mother and son; Heart failure in her father; Hyperlipidemia in her mother; Hypertension in her father, mother, sister, sister, and son; Osteoporosis in her mother; Pancreatitis in her son. There is no history of Cancer, Colon cancer, Esophageal cancer, Stomach cancer, or Rectal cancer.  ROS:   Please see the history of present illness.     All other systems reviewed and are negative.  EKGs/Labs/Other Studies Reviewed:    The following studies were reviewed today:   EKG:   01/26/2023: Normal sinus rhythm, rate 62, LVH with repolarization abnormalities   Recent Labs: 08/07/2022: TSH 1.369 01/26/2023: Magnesium 1.9 06/18/2023: ALT 22; BUN 17; Creatinine, Ser 1.04; Hemoglobin 12.7; Platelets 297; Potassium 3.5; Sodium 139  Recent Lipid Panel    Component Value Date/Time   CHOL 182 01/26/2023 1441   TRIG 173 (H) 01/26/2023 1441   HDL 52 01/26/2023 1441   CHOLHDL 3.5 01/26/2023 1441   CHOLHDL 4.7 10/01/2015 0732   VLDL 27 10/01/2015 0732   LDLCALC 100 (H) 01/26/2023 1441    Physical Exam:    VS:  BP (!)  152/60   Pulse 78   Ht 5\' 5"  (1.651 m)   Wt 195 lb (88.5 kg)   SpO2 98%  BMI 32.45 kg/m     Wt Readings from Last 3 Encounters:  07/05/23 195 lb (88.5 kg)  06/12/23 195 lb 12.8 oz (88.8 kg)  03/29/23 198 lb 6 oz (90 kg)     GEN:  Well nourished, well developed in no acute distress HEENT: Normal NECK: No JVD; No carotid bruits LYMPHATICS: No lymphadenopathy CARDIAC: RRR,2/6 systolic murmur RESPIRATORY:  Clear to auscultation without rales, wheezing or rhonchi  ABDOMEN: Soft, non-tender, non-distended MUSCULOSKELETAL:  Trace edema; No deformity  SKIN: Warm and dry NEUROLOGIC:  Alert and oriented x 3 PSYCHIATRIC:  Normal affect   ASSESSMENT:    1. Coronary artery disease involving native coronary artery of native heart without angina pectoris   2. Chronic diastolic heart failure (HCC)   3. OSA (obstructive sleep apnea)   4. Hyperlipidemia, unspecified hyperlipidemia type   5. Hypertension, unspecified type       PLAN:    CAD: Reports atypical chest pain, no clear relationship with exertion but does appear to be correlated to stress.  Coronary CTA on 11/26/2020 showed moderate CAD in mid and distal RCA, calcium score 936 (97th percentile).  CT FFR suggested total occlusion in mid to distal RCA and indeterminate FFR value in distal LAD that may be due to vessel tapering of small caliber distal vessel stenosis.  Cath 12/13/2020 showed chronic subtotal (99%) occlusion of distal RCA with left-to-right collaterals, mild proximal LAD disease.  Echocardiogram 12/10/2020 showed normal biventricular function, no significant valvular disease. -Continue aspirin 81 mg daily -Continue atorvastatin 80 mg daily -Continue carvedilol 12.5 mg twice daily -Continue Imdur 60 mg daily -As needed SL NTG -If refractory angina despite optimized antianginal regimen, could consider PCI to distal RCA.  Currently denies any anginal symptoms  Chronic diastolic heart failure: LVEDP 10-27 on cath 12/13/2020.   Started on Lasix 40 mg daily.  On potassium chloride 20 meq twice daily.  Appears euvolemic.  Check BMET, magnesium  Renal infarct: Presented with renal infarct in 2017.  TEE showed possible aortic valve vegetation versus Lambls excrescence.  Blood cultures negative.  Completed 6 months of warfarin.  Has not had recurrent evidence of embolic events.  Echocardiogram 12/10/2020 unremarkable.  Zio patch x14 days 11/15/2021 showed no significant arrhythmias.  Hypertension: On amlodipine 5 mg nightly, carvedilol 12.5 mg twice daily, Imdur 60 mg daily, losartan 100 mg daily, Lasix 40 mg daily -Suspect untreated OSA contributing, referred to sleep medicine - BP elevated in clinic today, asked to check BP twice daily for next week and let us know results  Hyperlipidemia: On atorvastatin 80 mg daily and Zetia 10 mg daily, LDL 1100 on 01/26/23.  Referred to pharmacy lipid clinic  OSA: Was on CPAP previously but unfortunately was noncompliant and her machine was taken back by her DME company.  Will refer to sleep medicine   RTC in 6 months   Medication Adjustments/Labs and Tests Ordered: Current medicines are reviewed at length with the patient today.  Concerns regarding medicines are outlined above.  Orders Placed This Encounter  Procedures   Basic Metabolic Panel (BMET)   Magnesium   Lipid panel   AMB Referral to [facility]   Ambulatory referral to Pulmonology      No orders of the defined types were placed in this encounter.      Patient Instructions  Medication Instructions:  No Changes  *If you need a refill on your cardiac medications before your next appointment, please call your pharmacy*  Lab Work: Today: Lipid Panel,  BMET, and Magnesium  If you have labs (blood work) drawn today and your tests are completely normal, you will receive your results only by: MyChart Message (if you have MyChart) OR A paper copy in the mail If you have any lab test that is abnormal or we  need to change your treatment, we will call you to review the results.  Follow-Up: At [facility], you and your health needs are our priority.  As part of our continuing mission to provide you with exceptional heart care, our providers are all part of one team.  This team includes your primary Cardiologist (physician) and Advanced Practice Providers or APPs (Physician Assistants and Nurse Practitioners) who all work together to provide you with the care you need, when you need it.  Your next appointment:   6 month(s) (Please call in July 2025 for an October 2025 appointment)  Provider:   Little Ishikawa, MD     Other Instructions Please check your Blood Pressure twice a day for one week, and send [place] your numbers thru a MyChart message or call us.  HOW TO TAKE YOUR BLOOD PRESSURE: Rest 10-15 minutes before taking your blood pressure. Don't smoke or drink caffeinated beverages for at least 30 minutes before. Take your blood pressure before (not after) you eat. Take your BP 1-2 hours after BP meds Ensure your Bladder is empty Sit comfortably with your back supported and both feet on the floor (don't cross your legs). Elevate your arm to heart level on a table or a desk. Use the proper sized cuff. It should fit smoothly and snugly around your bare upper arm. There should be enough room to slip a fingertip under the cuff. The bottom edge of the cuff should be 1 inch above the crease of the elbow.   Please call us or send a MyChart message with any Cardiology related questions/concerns.  [phone].  Thank you!       1st Floor: - Lobby - Registration  - Pharmacy  - Lab - Cafe  2nd Floor: - PV Lab - Diagnostic Testing (echo, CT, nuclear med)  3rd Floor: - Vacant  4th Floor: - TCTS (cardiothoracic surgery) - AFib Clinic - Structural Heart Clinic - Vascular Surgery  - Vascular Ultrasound  5th Floor: - HeartCare Cardiology (general and EP) - Clinical  Pharmacy for coumadin, hypertension, lipid, weight-loss medications, and med management appointments    Valet parking services will be available as well.      Signed, Little Ishikawa, MD  07/05/2023 4:33 PM    [facility]

## 2023-07-05 NOTE — Patient Instructions (Signed)
 Medication Instructions:  No Changes  *If you need a refill on your cardiac medications before your next appointment, please call your pharmacy*  Lab Work: Today: Lipid Panel, BMET, and Magnesium  If you have labs (blood work) drawn today and your tests are completely normal, you will receive your results only by: MyChart Message (if you have MyChart) OR A paper copy in the mail If you have any lab test that is abnormal or we need to change your treatment, we will call you to review the results.  Follow-Up: At [facility], you and your health needs are our priority.  As part of our continuing mission to provide you with exceptional heart care, our providers are all part of one team.  This team includes your primary Cardiologist (physician) and Advanced Practice Providers or APPs (Physician Assistants and Nurse Practitioners) who all work together to provide you with the care you need, when you need it.  Your next appointment:   6 month(s) (Please call in July 2025 for an October 2025 appointment)  Provider:   Little Ishikawa, MD     Other Instructions Please check your Blood Pressure twice a day for one week, and send [place] your numbers thru a MyChart message or call us.  HOW TO TAKE YOUR BLOOD PRESSURE: Rest 10-15 minutes before taking your blood pressure. Don't smoke or drink caffeinated beverages for at least 30 minutes before. Take your blood pressure before (not after) you eat. Take your BP 1-2 hours after BP meds Ensure your Bladder is empty Sit comfortably with your back supported and both feet on the floor (don't cross your legs). Elevate your arm to heart level on a table or a desk. Use the proper sized cuff. It should fit smoothly and snugly around your bare upper arm. There should be enough room to slip a fingertip under the cuff. The bottom edge of the cuff should be 1 inch above the crease of the elbow.   Please call us or send a MyChart message with  any Cardiology related questions/concerns.  [phone].  Thank you!       1st Floor: - Lobby - Registration  - Pharmacy  - Lab - Cafe  2nd Floor: - PV Lab - Diagnostic Testing (echo, CT, nuclear med)  3rd Floor: - Vacant  4th Floor: - TCTS (cardiothoracic surgery) - AFib Clinic - Structural Heart Clinic - Vascular Surgery  - Vascular Ultrasound  5th Floor: - HeartCare Cardiology (general and EP) - Clinical Pharmacy for coumadin, hypertension, lipid, weight-loss medications, and med management appointments    Valet parking services will be available as well.

## 2023-07-06 LAB — BASIC METABOLIC PANEL WITH GFR
BUN/Creatinine Ratio: 17 (ref 12–28)
BUN: 19 mg/dL (ref 8–27)
CO2: 25 mmol/L (ref 20–29)
Calcium: 9.6 mg/dL (ref 8.7–10.3)
Chloride: 105 mmol/L (ref 96–106)
Creatinine, Ser: 1.13 mg/dL — ABNORMAL HIGH (ref 0.57–1.00)
Glucose: 82 mg/dL (ref 70–99)
Potassium: 3.8 mmol/L (ref 3.5–5.2)
Sodium: 145 mmol/L — ABNORMAL HIGH (ref 134–144)
eGFR: 51 mL/min/{1.73_m2} — ABNORMAL LOW (ref >59)

## 2023-07-06 LAB — LIPID PANEL
Chol/HDL Ratio: 3.1 ratio (ref 0.0–4.4)
Cholesterol, Total: 187 mg/dL (ref 100–199)
HDL: 60 mg/dL (ref >39)
LDL Chol Calc (NIH): 105 mg/dL — ABNORMAL HIGH (ref 0–99)
Triglycerides: 126 mg/dL (ref 0–149)
VLDL Cholesterol Cal: 22 mg/dL (ref 5–40)

## 2023-07-06 LAB — MAGNESIUM: Magnesium: 1.6 mg/dL (ref 1.6–2.3)

## 2023-07-10 ENCOUNTER — Other Ambulatory Visit: Payer: Self-pay | Admitting: Student

## 2023-07-10 DIAGNOSIS — E876 Hypokalemia: Secondary | ICD-10-CM

## 2023-07-11 ENCOUNTER — Other Ambulatory Visit: Payer: Self-pay | Admitting: *Deleted

## 2023-07-11 ENCOUNTER — Encounter: Payer: Self-pay | Admitting: *Deleted

## 2023-07-11 DIAGNOSIS — Z79899 Other long term (current) drug therapy: Secondary | ICD-10-CM

## 2023-07-11 DIAGNOSIS — I2510 Atherosclerotic heart disease of native coronary artery without angina pectoris: Secondary | ICD-10-CM

## 2023-07-11 DIAGNOSIS — I10 Essential (primary) hypertension: Secondary | ICD-10-CM

## 2023-07-12 ENCOUNTER — Other Ambulatory Visit: Payer: Self-pay | Admitting: *Deleted

## 2023-07-12 DIAGNOSIS — I2510 Atherosclerotic heart disease of native coronary artery without angina pectoris: Secondary | ICD-10-CM

## 2023-07-12 DIAGNOSIS — E785 Hyperlipidemia, unspecified: Secondary | ICD-10-CM

## 2023-07-16 ENCOUNTER — Telehealth: Payer: Self-pay | Admitting: Cardiology

## 2023-07-16 NOTE — Telephone Encounter (Signed)
 Pt c/o BP issue: STAT if pt c/o blurred vision, one-sided weakness or slurred speech.  STAT if BP is GREATER than 180/120 TODAY.  STAT if BP is LESS than 90/60 and SYMPTOMATIC TODAY  1. What is your BP concern? Elevated   2. Have you taken any BP medication today? N/A  3. What are your last 5 BP readings? Yesterday: 157/80 Today 143/77  4. Are you having any other symptoms (ex. Dizziness, headache, blurred vision, passed out)? Pt states she was told to call and give her BP readings.

## 2023-07-16 NOTE — Telephone Encounter (Signed)
 Patient identification verified by 2 forms.   Called and spoke to patient  Patient states:  -Only has 2 readings   Patient denies:  -Taking blood pressure daily             Interventions/Plan: -Pt will continue to take her blood pressure daily and call back to report new readings  Patient agrees with plan, no questions at this time

## 2023-07-18 ENCOUNTER — Ambulatory Visit (INDEPENDENT_AMBULATORY_CARE_PROVIDER_SITE_OTHER): Admitting: Podiatrist

## 2023-07-18 ENCOUNTER — Encounter: Payer: Self-pay | Admitting: Podiatrist

## 2023-07-18 DIAGNOSIS — L608 Other nail disorders: Secondary | ICD-10-CM | POA: Diagnosis not present

## 2023-07-18 DIAGNOSIS — B351 Tinea unguium: Secondary | ICD-10-CM

## 2023-07-18 DIAGNOSIS — L603 Nail dystrophy: Secondary | ICD-10-CM | POA: Diagnosis not present

## 2023-07-18 NOTE — Progress Notes (Signed)
 Chief Complaint  Patient presents with   Nail Problem    Hallux bilateral - noticing nails are looking white, concern for fungus     HPI: Patient is 73 y.o. female who presents today for concern of fungus left greater than right hallux nails.  She relates the nails are changing in color and starting to look white.  She is concerned there may be fungus present.     Allergies  Allergen Reactions   Shrimp [Shellfish Allergy] Hives and Itching   Latex Itching   Penicillins Itching and Rash    Has patient had a PCN reaction causing immediate rash, facial/tongue/throat swelling, SOB or lightheadedness with hypotension: unknown Has patient had a PCN reaction causing severe rash involving mucus membranes or skin necrosis: unknown Has patient had a PCN reaction that required hospitalization unknown Has patient had a PCN reaction occurring within the last 10 years: No If all of the above answers are "NO", then may proceed with Cephalosporin use.    Review of systems is negative except as noted in the HPI.  Denies nausea/ vomiting/ fevers/ chills or night sweats.   Denies difficulty breathing, denies calf pain or tenderness  Physical Exam  Patient is awake, alert, and oriented x 3.  In no acute distress.    Vascular status is intact with palpable pedal pulses DP and PT bilateral and capillary refill time less than 3 seconds bilateral.  No edema or erythema noted.   Neurological exam reveals epicritic and protective sensation grossly intact bilateral.   Dermatological exam reveals skin is supple and dry to bilateral feet.  Hallux nails do have some discoloration present.  Nails otherwise are mildly thickened.  Dystrophic.   Musculoskeletal exam: Musculature intact with dorsiflexion, plantarflexion, inversion, eversion. Ankle and First MPJ joint range of motion normal.     Assessment:   ICD-10-CM   1. Onychomycosis of great toe  B35.1        Plan: Treatment options for onychomycosis  discussed including topical, laser and oral therapy.  At todays visit a Nail culture was taken of the left hallux nail-  will call with result and decide if treatment is needed based on culture result.

## 2023-07-23 ENCOUNTER — Telehealth: Payer: Self-pay | Admitting: Cardiology

## 2023-07-23 NOTE — Telephone Encounter (Signed)
 Patient wants a call back to discuss upcoming lab test.

## 2023-07-23 NOTE — Telephone Encounter (Addendum)
 Patient identification verified by 2 forms. Denise Duck, RN     Called and spoke to patient  Patient states:  - She could not remember why she was coming back in for labwork.   Patient denies:              Interventions/Plan: - Chart review of encounter note from 4/16 shows patient completed blood work at her last OV. The results showed patient Mag was low and kidney function was not stable.  - Dr Alda Amas prescribed Mag supplement and ordered repeat labs in 2wks.  - Reviewed previous note and recommendations with patient. Patient will come in for labs today to [address] location.    Patient agrees with plan, no further questions at this time

## 2023-07-24 NOTE — Telephone Encounter (Signed)
 Called and unable to reach patient. Left message to call office for any questions.

## 2023-07-25 ENCOUNTER — Ambulatory Visit: Admitting: Podiatry

## 2023-07-25 ENCOUNTER — Ambulatory Visit (HOSPITAL_COMMUNITY)
Admission: EM | Admit: 2023-07-25 | Discharge: 2023-07-25 | Disposition: A | Discharge status: Home / Self Care | Attending: Family Medicine | Admitting: Family Medicine

## 2023-07-25 ENCOUNTER — Encounter (HOSPITAL_COMMUNITY): Payer: Self-pay

## 2023-07-25 DIAGNOSIS — Z79899 Other long term (current) drug therapy: Secondary | ICD-10-CM | POA: Diagnosis not present

## 2023-07-25 DIAGNOSIS — N289 Disorder of kidney and ureter, unspecified: Secondary | ICD-10-CM | POA: Insufficient documentation

## 2023-07-25 DIAGNOSIS — N898 Other specified noninflammatory disorders of vagina: Secondary | ICD-10-CM | POA: Insufficient documentation

## 2023-07-25 DIAGNOSIS — I2510 Atherosclerotic heart disease of native coronary artery without angina pectoris: Secondary | ICD-10-CM | POA: Diagnosis not present

## 2023-07-25 DIAGNOSIS — I10 Essential (primary) hypertension: Secondary | ICD-10-CM | POA: Insufficient documentation

## 2023-07-25 DIAGNOSIS — R109 Unspecified abdominal pain: Secondary | ICD-10-CM | POA: Insufficient documentation

## 2023-07-25 LAB — POCT URINALYSIS DIP (MANUAL ENTRY)
Bilirubin, UA: NEGATIVE
Blood, UA: NEGATIVE
Glucose, UA: NEGATIVE mg/dL
Ketones, POC UA: NEGATIVE mg/dL
Leukocytes, UA: NEGATIVE
Nitrite, UA: NEGATIVE
Protein Ur, POC: NEGATIVE mg/dL
Spec Grav, UA: 1.015 (ref 1.010–1.025)
Urobilinogen, UA: 0.2 U/dL
pH, UA: 5.5 (ref 5.0–8.0)

## 2023-07-25 MED ORDER — PREDNISONE 20 MG PO TABS
40.0000 mg | ORAL_TABLET | Freq: Every morning | ORAL | 0 refills | Status: AC
Start: 2023-07-25 — End: 2023-07-30

## 2023-07-25 MED ORDER — FLUCONAZOLE 150 MG PO TABS
150.0000 mg | ORAL_TABLET | ORAL | 0 refills | Status: AC
Start: 2023-07-25 — End: 2023-07-29

## 2023-07-25 MED ORDER — DEXAMETHASONE SODIUM PHOSPHATE 10 MG/ML IJ SOLN
INTRAMUSCULAR | Status: AC
  Filled 2023-07-25: qty 1

## 2023-07-25 MED ORDER — DICLOFENAC SODIUM 1 % EX GEL: 4.0000 g | Freq: Four times a day (QID) | CUTANEOUS | 0 refills | Status: DC

## 2023-07-25 MED ORDER — DEXAMETHASONE SODIUM PHOSPHATE 10 MG/ML IJ SOLN
10.0000 mg | Freq: Once | INTRAMUSCULAR | Status: AC
Start: 2023-07-25 — End: 2023-07-25
  Administered 2023-07-25: 10 mg via INTRAMUSCULAR

## 2023-07-25 NOTE — ED Triage Notes (Signed)
 Pt c/o lt side pain for months and became worse in past few days. Denies urinary sx's. C/o vagina itching. Denies injury. Taken tylenol  with no relief.

## 2023-07-25 NOTE — ED Provider Notes (Signed)
 [facility]    CSN: [id number] Arrival date & time: 07/25/23  1532      History   Chief Complaint Chief Complaint  Patient presents with   Flank Pain    HPI [name] is a 73 y.o. female.   [name] is a 73 year old female presenting with left flank pain for the past week. She denies any recent injury. The pain is non-radiating and she reports no associated dysuria, urinary frequency, low back pain, leg weakness, paresthesias, or decreased sensation in the left lower extremity. She has only taken Tylenol  for the pain, which has not provided relief. Additionally, she reports ongoing vaginal itching. She was previously seen here in March for similar symptoms and was prescribed Diflucan . Aptima swab returned positive for Candida species. Despite completing the medication, she reports no improvement in the itching. She denies any vaginal discharge and is not sexually active.      Past Medical History:  Diagnosis Date   Abnormal EKG 2012   hospitalized for T wave inversion in lateral leads with MSK chest pains, normal ECHO, negative trops, no cardiology consult. recent EKG 7/15 stable T wave inversions.    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   Arthritis 2000   Arthritis 2011   Bilateral leg pain 06/14/2015   Clotting disorder (HCC)    blood clot kidney 2017   Heart murmur    Hyperlipidemia 2011   Hypertension 2011   Left elbow pain 03/31/2020   Meniscus tear 2000   L KNEE   Pelvic pain 10/07/2018   Renal infarct (HCC) 2017   blood clot in the kidney    Patient Active Problem List   Diagnosis Date Noted   Stomach discomfort 01/05/2023   Acute cough 08/10/2022   Leukocytosis 08/10/2022   Scoliosis of lumbar spine 02/28/2022   Candida glabrata infection 02/28/2022   Daytime somnolence 01/12/2021   Atypical chest pain 12/13/2020   Abnormal cardiac CT angiography 12/13/2020   Contact dermatitis 01/14/2019   Vaginal itching 12/05/2018   Leg edema  08/28/2018   Thickened endometrium    Umbilical hernia without obstruction and without gangrene 01/01/2018   Osteopenia 09/05/2017   Closed nondisplaced spiral fracture of shaft of right tibia 03/28/2017   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/30/2015   HLD (hyperlipidemia)    Aortic atherosclerosis (HCC)    Back pain 07/08/2015   Scotoma of blind spot area in visual field 06/14/2015   Seasonal allergies 03/09/2015   Flank pain 03/09/2015   Hypopigmentation 03/09/2015   Unilateral primary osteoarthritis, left knee 07/08/2014   DJD (degenerative joint disease), lumbar 07/08/2014   Osteoarthritis of both knees 07/08/2014   Urinary frequency 06/11/2014   Nearsightedness 04/27/2014   Abnormal EKG    Hypertension 12/11/2013    Past Surgical History:  Procedure Laterality Date   ABDOMINAL SURGERY  2010   for bowel blockage   COLON SURGERY  2010   bowel blockage   COLONOSCOPY  09/2014   HYSTEROSCOPY WITH D & C N/A 05/08/2018   Procedure: DILATATION AND CURETTAGE /HYSTEROSCOPY;  Surgeon: Verlyn Goad, MD;  Location: [facility];  Service: Gynecology;  Laterality: N/A;   KNEE ARTHROSCOPY Left 09/25/2013   Procedure: LEFT KNEE ARTHROSCOPY WITH PARTIAL MEDIAL AND LATERAL  MENISCECTOMY/DEBRIDEMENT/MICRO FRACTURE MEDIAL FEMORAL CONDYLE, REMOVAL OF OSTEOCONDRAL FRAGMENT;  Surgeon: Loel Ring, MD;  Location: [facility];  Service: Orthopedics;  Laterality: Left;   KNEE SURGERY Right 1999   LEFT HEART CATH AND  CORONARY ANGIOGRAPHY N/A 12/13/2020   Procedure: LEFT HEART CATH AND CORONARY ANGIOGRAPHY;  Surgeon: Sammy Crisp, MD;  Location: [facility];  Service: Cardiovascular;  Laterality: N/A;   POLYPECTOMY     TEE WITHOUT CARDIOVERSION N/A 10/04/2015   Procedure: TRANSESOPHAGEAL ECHOCARDIOGRAM (TEE);  Surgeon: Jacqueline Matsu, MD;  Location: [facility];  Service: Cardiovascular;  Laterality: N/A;   TUBAL LIGATION  1978    OB History      Gravida  2   Para  2   Term      Preterm      AB      Living  2      SAB      IAB      Ectopic      Multiple      Live Births               Home Medications    Prior to Admission medications   Medication Sig Start Date End Date Taking? Authorizing Provider  diclofenac  Sodium (VOLTAREN ) 1 % GEL Apply 4 g topically 4 (four) times daily. Massage to area of pain four times a day 07/25/23  Yes Maryruth Sol, FNP  fluconazole  (DIFLUCAN ) 150 MG tablet Take 1 tablet (150 mg total) by mouth every 3 (three) days for 2 doses. 07/25/23 07/29/23 Yes Layli Capshaw, FNP  predniSONE  (DELTASONE ) 20 MG tablet Take 2 tablets (40 mg total) by mouth every morning for 5 days. 07/25/23 07/30/23 Yes [place] Mielke, FNP  amLODipine  (NORVASC ) 5 MG tablet TAKE 1 TABLET(5 MG) BY MOUTH DAILY 04/30/23   Goble Last, MD  aspirin  EC 81 MG tablet Take 1 tablet (81 mg total) daily by mouth. 01/31/17   Jamas Maywood, PA-C  atorvastatin  (LIPITOR) 80 MG tablet TAKE 1 TABLET(80 MG) BY MOUTH DAILY AT 6 PM 09/04/22   Wendie Hamburg, MD  azelastine  (ASTELIN ) 0.1 % nasal spray Place 1 spray into both nostrils 2 (two) times daily. Use in each nostril as directed 06/18/23   Reddick, Johnathan B, NP  Capsaicin-Menthol-Methyl Sal (CAPSAICIN-METHYL SAL-MENTHOL) 0.025-1-12 % CREA Apply 1 Tube topically daily. 05/05/22   Goble Last, MD  carvedilol  (COREG ) 12.5 MG tablet TAKE 1 TABLET(12.5 MG) BY MOUTH TWICE DAILY WITH A MEAL 05/14/23   Wilhemena Harbour, MD  ciclopirox  (PENLAC ) 8 % solution Apply topically at bedtime. Apply over nail and surrounding skin. Apply daily over previous coat. After seven (7) days, may remove with alcohol and continue cycle. 07/13/22   Velma Ghazi, DPM  ezetimibe  (ZETIA ) 10 MG tablet Take 1 tablet (10 mg total) by mouth daily. 01/29/23 04/29/23  Wendie Hamburg, MD  fluticasone  (FLONASE ) 50 MCG/ACT nasal spray SHAKE LIQUID AND USE 2 SPRAYS IN EACH NOSTRIL TWICE DAILY  07/07/22   Goble Last, MD  furosemide  (LASIX ) 40 MG tablet Take 1 tablet (40 mg total) by mouth daily. 09/14/22 09/14/23  Goble Last, MD  gabapentin  (NEURONTIN ) 300 MG capsule Take 1 capsule (300 mg total) by mouth 2 (two) times daily. 03/24/23   Dalene Duck, MD  hydrochlorothiazide  (HYDRODIURIL ) 25 MG tablet Take 1 tablet (25 mg total) by mouth daily. 03/24/23   Dalene Duck, MD  isosorbide  mononitrate (IMDUR ) 60 MG 24 hr tablet TAKE 1 TABLET(60 MG) BY MOUTH DAILY 09/04/22   Wendie Hamburg, MD  ketoconazole (NIZORAL) 2 % cream Apply 1 Application topically as needed. 11/13/22   [provider]  ketoconazole (NIZORAL) 2 % shampoo Apply 1 Application topically as  needed. 11/13/22   [provider]  losartan  (COZAAR ) 100 MG tablet TAKE 1 TABLET(100 MG) BY MOUTH DAILY 08/07/22   Goble Last, MD  Multiple Vitamin (MULITIVITAMIN WITH MINERALS) TABS Take 1 tablet by mouth daily.    [provider]  nitroGLYCERIN  (NITROSTAT ) 0.4 MG SL tablet PLACE 1 TABLET UNDER THE TONGUE EVERY 5 MINUTES AS NEEDED. 07/06/22   Wendie Hamburg, MD  potassium chloride  SA (KLOR-CON  M) 20 MEQ tablet TAKE 1 TABLET(20 MEQ) BY MOUTH TWICE DAILY 07/10/23   Goble Last, MD    Family History Family History  Problem Relation Age of Onset   Hypertension Mother    Heart disease Mother        has a pacemaker    Alzheimer's disease Mother    Hyperlipidemia Mother    Osteoporosis Mother    Heart failure Father    Hypertension Father    Alzheimer's disease Father    Hypertension Sister    Breast cancer Sister    Hypertension Sister    Gout Sister    Heart disease Son    Alcohol abuse Son    Pancreatitis Son    Hypertension Son    Cancer Neg Hx    Colon cancer Neg Hx    Esophageal cancer Neg Hx    Stomach cancer Neg Hx    Rectal cancer Neg Hx     Social History Social History   Tobacco Use   Smoking status: Former    Current packs/day: 0.00    Types:  Cigarettes    Quit date: 07/29/2010    Years since quitting: 12.9    Passive exposure: Past   Smokeless tobacco: Never  Vaping Use   Vaping status: Never Used  Substance Use Topics   Alcohol use: Not Currently   Drug use: Never     Allergies   Shrimp [shellfish allergy], Latex, and Penicillins   Review of Systems Review of Systems  Constitutional:  Negative for fever.  Gastrointestinal:  Negative for abdominal pain, nausea and vomiting.  Genitourinary:  Positive for flank pain. Negative for dysuria, frequency, genital sores, hematuria, urgency and vaginal discharge.  Musculoskeletal:  Positive for arthralgias. Negative for back pain, gait problem, joint swelling and myalgias.  Neurological:  Negative for dizziness, weakness, numbness and headaches.  All other systems reviewed and are negative.    Physical Exam Triage Vital Signs ED Triage Vitals [07/25/23 1713]  Encounter Vitals Group     BP (!) 189/80     Systolic BP Percentile      Diastolic BP Percentile      Pulse Rate 66     Resp 18     Temp (!) 97.4 F (36.3 C)     Temp Source Oral     SpO2 96 %     Weight      Height      Head Circumference      Peak Flow      Pain Score 10     Pain Loc      Pain Education      Exclude from Growth Chart    No data found.  Updated Vital Signs BP (!) 189/80 (BP Location: Right Arm)   Pulse 66   Temp (!) 97.4 F (36.3 C) (Oral)   Resp 18   SpO2 96%   Visual Acuity Right Eye Distance:   Left Eye Distance:   Bilateral Distance:    Right Eye Near:   Left Eye Near:  Bilateral Near:     Physical Exam Vitals reviewed.  Constitutional:      General: She is awake. She is not in acute distress.    Appearance: Normal appearance. She is well-developed. She is not ill-appearing, toxic-appearing or diaphoretic.  HENT:     Head: Normocephalic.     Nose: Nose normal.     Mouth/Throat:     Mouth: Mucous membranes are moist.  Eyes:     Conjunctiva/sclera:  Conjunctivae normal.  Cardiovascular:     Rate and Rhythm: Normal rate.  Pulmonary:     Effort: Pulmonary effort is normal.  Abdominal:     General: Bowel sounds are normal.     Palpations: Abdomen is soft.     Tenderness: There is no abdominal tenderness. There is no right CVA tenderness or left CVA tenderness.  Genitourinary:    Comments: Deferred; patient performed self-swab for Aptima testing  Musculoskeletal:        General: Normal range of motion.     Cervical back: Normal, normal range of motion and neck supple.     Thoracic back: Normal.     Lumbar back: Normal.       Back:  Skin:    General: Skin is warm and dry.  Neurological:     General: No focal deficit present.     Mental Status: She is alert and oriented to person, place, and time.     Cranial Nerves: Cranial nerves 2-12 are intact. No cranial nerve deficit.     Sensory: Sensation is intact. No sensory deficit.     Motor: Motor function is intact. No weakness.     Coordination: Coordination is intact.     Gait: Gait is intact.  Psychiatric:        Mood and Affect: Mood normal.        Behavior: Behavior normal. Behavior is cooperative.      UC Treatments / Results  Labs (all labs ordered are listed, but only abnormal results are displayed) Labs Reviewed  POCT URINALYSIS DIP (MANUAL ENTRY)  CERVICOVAGINAL ANCILLARY ONLY    EKG   Radiology No results found.  Procedures Procedures (including critical care time)  Medications Ordered in UC Medications  dexamethasone  (DECADRON ) injection 10 mg (10 mg Intramuscular Given 07/25/23 1857)    Initial Impression / Assessment and Plan / UC Course  I have reviewed the triage vital signs and the nursing notes.  Pertinent labs & imaging results that were available during my care of the patient were reviewed by me and considered in my medical decision making (see chart for details).    73 year old female presenting with a one-week history of left flank pain  without history of injury or urinary symptoms. Physical exam is as documented and without focal findings. She also reports persistent vaginal itching despite a prior course of Diflucan  and a previous Aptima swab positive for Candida.  GU exam deferred.  Patient again performed self swab for repeat Aptima testing.  She was prescribed Diflucan  1 tablet every 3 days for two doses for continued symptoms, along with prednisone  and diclofenac  gel for her flank pain. She was advised to continue using extra strength Tylenol  as needed. NSAIDs should be avoided due to her consistently elevated creatinine levels noted since September 2024, including the most recent result from 07/05/2023. She is currently taking hydrochlorothiazide  and furosemide  for hypertension and reports compliance with her medications. She was advised to follow up closely with her primary care provider, as her blood  pressure medications may need to be adjusted if her kidney function continues to decline. The importance of continued medication compliance and maintaining blood pressure control was emphasized, as uncontrolled hypertension can further impact her kidney health.  Today's evaluation has revealed no signs of a dangerous process. Discussed diagnosis with patient and/or guardian. Patient and/or guardian aware of their diagnosis, possible red flag symptoms to watch out for and need for close follow up. Patient and/or guardian understands verbal and written discharge instructions. Patient and/or guardian comfortable with plan and disposition.  Patient and/or guardian has a clear mental status at this time, good insight into illness (after discussion and teaching) and has clear judgment to make decisions regarding their care  Documentation was completed with the aid of voice recognition software. Transcription may contain typographical errors.  Final Clinical Impressions(s) / UC Diagnoses   Final diagnoses:  Left flank pain  Vaginal itching   Essential hypertension  Decreased renal function     Discharge Instructions      You were seen today for left flank pain that has been present for one week. You do not have any urinary symptoms, back pain, fever, or history of injury. Your urinalysis was normal and showed no signs of infection or other concerns. At this time, the cause of your pain is unclear but does not appear to be related to your kidneys or urinary system. You have been prescribed a short course of prednisone  to help reduce inflammation and Voltaren  gel to apply to the affected area for localized relief. You may continue taking extra strength Tylenol  as needed for pain. Do not take any NSAIDs such as ibuprofen , naproxen , or aspirin  due to reduced kidney function. Monitor your symptoms closely, and follow up with your primary care provider if the pain worsens, persists, or if new symptoms develop.  Your kidney function, specifically your creatinine level, has been consistently elevated since September 2024. It is important that you follow up with your primary care provider for a recheck and continued monitoring of your kidney function. You are currently taking hydrochlorothiazide  and furosemide  for blood pressure management. These medications may need to be adjusted or changed if your kidney function remains elevated, in order to help preserve your kidney health. It is also important to maintain good blood pressure control, as high blood pressure can further damage the kidneys over time.  You were also seen today for vaginal itching. This can be caused by several things, including yeast infections, irritation, or other infections. You were treated with Diflucan , an oral antifungal medication commonly used to treat yeast infections. A vaginal swab was collected, and the results are pending. You will be contacted if any follow-up or additional treatment is needed. In the meantime, avoid using scented soaps, sprays, or douches in the  vaginal area, and wear breathable, cotton underwear to reduce irritation. Follow up with your healthcare provider if symptoms worsen or do not improve within a few days.     ED Prescriptions     Medication Sig Dispense Auth. Provider   fluconazole  (DIFLUCAN ) 150 MG tablet Take 1 tablet (150 mg total) by mouth every 3 (three) days for 2 doses. 2 tablet Maryruth Sol, FNP   predniSONE  (DELTASONE ) 20 MG tablet Take 2 tablets (40 mg total) by mouth every morning for 5 days. 10 tablet Maryruth Sol, FNP   diclofenac  Sodium (VOLTAREN ) 1 % GEL Apply 4 g topically 4 (four) times daily. Massage to area of pain four times a day 200 g [name],  Ova Bloomer, FNP      PDMP not reviewed this encounter.   Maryruth Sol, [place] 07/25/23 2052

## 2023-07-25 NOTE — Discharge Instructions (Addendum)
 You were seen today for left flank pain that has been present for one week. You do not have any urinary symptoms, back pain, fever, or history of injury. Your urinalysis was normal and showed no signs of infection or other concerns. At this time, the cause of your pain is unclear but does not appear to be related to your kidneys or urinary system. You have been prescribed a short course of prednisone  to help reduce inflammation and Voltaren  gel to apply to the affected area for localized relief. You may continue taking extra strength Tylenol  as needed for pain. Do not take any NSAIDs such as ibuprofen , naproxen , or aspirin  due to reduced kidney function. Monitor your symptoms closely, and follow up with your primary care provider if the pain worsens, persists, or if new symptoms develop.  Your kidney function, specifically your creatinine level, has been consistently elevated since September 2024. It is important that you follow up with your primary care provider for a recheck and continued monitoring of your kidney function. You are currently taking hydrochlorothiazide  and furosemide  for blood pressure management. These medications may need to be adjusted or changed if your kidney function remains elevated, in order to help preserve your kidney health. It is also important to maintain good blood pressure control, as high blood pressure can further damage the kidneys over time.  You were also seen today for vaginal itching. This can be caused by several things, including yeast infections, irritation, or other infections. You were treated with Diflucan , an oral antifungal medication commonly used to treat yeast infections. A vaginal swab was collected, and the results are pending. You will be contacted if any follow-up or additional treatment is needed. In the meantime, avoid using scented soaps, sprays, or douches in the vaginal area, and wear breathable, cotton underwear to reduce irritation. Follow up with  your healthcare provider if symptoms worsen or do not improve within a few days.

## 2023-07-26 ENCOUNTER — Telehealth: Payer: Self-pay | Admitting: *Deleted

## 2023-07-26 DIAGNOSIS — I10 Essential (primary) hypertension: Secondary | ICD-10-CM

## 2023-07-26 LAB — CERVICOVAGINAL ANCILLARY ONLY
Bacterial Vaginitis (gardnerella): NEGATIVE
Candida Glabrata: POSITIVE — AB
Candida Vaginitis: NEGATIVE
Chlamydia: NEGATIVE
Comment: NEGATIVE
Comment: NEGATIVE
Comment: NEGATIVE
Comment: NEGATIVE
Comment: NEGATIVE
Comment: NORMAL
Neisseria Gonorrhea: NEGATIVE
Trichomonas: NEGATIVE

## 2023-07-26 LAB — BASIC METABOLIC PANEL WITH GFR
BUN/Creatinine Ratio: 15 (ref 12–28)
BUN: 19 mg/dL (ref 8–27)
CO2: 25 mmol/L (ref 20–29)
Calcium: 9.7 mg/dL (ref 8.7–10.3)
Chloride: 101 mmol/L (ref 96–106)
Creatinine, Ser: 1.23 mg/dL — ABNORMAL HIGH (ref 0.57–1.00)
Glucose: 100 mg/dL — ABNORMAL HIGH (ref 70–99)
Potassium: 4 mmol/L (ref 3.5–5.2)
Sodium: 142 mmol/L (ref 134–144)
eGFR: 46 mL/min/{1.73_m2} — ABNORMAL LOW (ref >59)

## 2023-07-26 LAB — MAGNESIUM: Magnesium: 1.6 mg/dL (ref 1.6–2.3)

## 2023-07-26 MED ORDER — MAGNESIUM OXIDE (ELEMENTAL) 400 MG PO TABS
400.0000 mg | ORAL_TABLET | Freq: Two times a day (BID) | ORAL | 3 refills | Status: AC
Start: 2023-07-26 — End: ?

## 2023-07-26 NOTE — Telephone Encounter (Signed)
 Called patient and gave her lab results and recommendations per Dr. Alda Amas to increase magnesium  oxide 400mg  twice a day and repeat lab work BMET and Mg. Patient verbalized understanding.

## 2023-08-01 ENCOUNTER — Telehealth: Payer: Self-pay | Admitting: Cardiology

## 2023-08-01 NOTE — Telephone Encounter (Signed)
 New Message:      Patient said she had lab work last week. She wants to know when is she supposed to have work again please?

## 2023-08-01 NOTE — Telephone Encounter (Signed)
 Called and spoke to pt; She needs to be on Mag Ox 400 mg BID and also recheck labs on 08/26/23. She verbalized understanding.

## 2023-08-02 ENCOUNTER — Other Ambulatory Visit: Payer: Self-pay | Admitting: Podiatrist

## 2023-08-08 ENCOUNTER — Other Ambulatory Visit: Payer: Self-pay | Admitting: Cardiology

## 2023-08-08 ENCOUNTER — Other Ambulatory Visit: Payer: Self-pay | Admitting: Student

## 2023-08-08 ENCOUNTER — Ambulatory Visit: Payer: Self-pay | Admitting: Podiatrist

## 2023-08-08 DIAGNOSIS — R079 Chest pain, unspecified: Secondary | ICD-10-CM

## 2023-08-08 DIAGNOSIS — I10 Essential (primary) hypertension: Secondary | ICD-10-CM

## 2023-08-12 ENCOUNTER — Other Ambulatory Visit: Payer: Self-pay | Admitting: Student

## 2023-08-14 ENCOUNTER — Ambulatory Visit (INDEPENDENT_AMBULATORY_CARE_PROVIDER_SITE_OTHER)

## 2023-08-14 ENCOUNTER — Ambulatory Visit (HOSPITAL_COMMUNITY)
Admission: EM | Admit: 2023-08-14 | Discharge: 2023-08-14 | Disposition: A | Discharge status: Home / Self Care | Attending: Internal Medicine | Admitting: Internal Medicine

## 2023-08-14 ENCOUNTER — Encounter (HOSPITAL_COMMUNITY): Payer: Self-pay

## 2023-08-14 ENCOUNTER — Telehealth (HOSPITAL_COMMUNITY): Payer: Self-pay

## 2023-08-14 DIAGNOSIS — J22 Unspecified acute lower respiratory infection: Secondary | ICD-10-CM

## 2023-08-14 DIAGNOSIS — R051 Acute cough: Secondary | ICD-10-CM | POA: Diagnosis not present

## 2023-08-14 DIAGNOSIS — R059 Cough, unspecified: Secondary | ICD-10-CM | POA: Diagnosis not present

## 2023-08-14 DIAGNOSIS — R9389 Abnormal findings on diagnostic imaging of other specified body structures: Secondary | ICD-10-CM | POA: Diagnosis not present

## 2023-08-14 LAB — POC COVID19/FLU A&B COMBO
Covid Antigen, POC: NEGATIVE
Influenza A Antigen, POC: NEGATIVE
Influenza B Antigen, POC: NEGATIVE

## 2023-08-14 MED ORDER — AZITHROMYCIN 250 MG PO TABS: ORAL_TABLET | ORAL | 0 refills | Status: DC

## 2023-08-14 MED ORDER — PROMETHAZINE-DM 6.25-15 MG/5ML PO SYRP: 5.0000 mL | ORAL_SOLUTION | Freq: Three times a day (TID) | ORAL | 0 refills | Status: DC | PRN

## 2023-08-14 MED ORDER — BENZONATATE 100 MG PO CAPS: 100.0000 mg | ORAL_CAPSULE | Freq: Three times a day (TID) | ORAL | 0 refills | Status: DC

## 2023-08-14 MED ORDER — KETOROLAC TROMETHAMINE 30 MG/ML IJ SOLN
15.0000 mg | Freq: Once | INTRAMUSCULAR | Status: AC
  Administered 2023-08-14: 15 mg via INTRAMUSCULAR

## 2023-08-14 MED ORDER — KETOROLAC TROMETHAMINE 30 MG/ML IJ SOLN
INTRAMUSCULAR | Status: AC
  Filled 2023-08-14: qty 1

## 2023-08-14 NOTE — ED Provider Notes (Addendum)
 [facility]    CSN: [id number] Arrival date & time: 08/14/23  1457      History   Chief Complaint Chief Complaint  Patient presents with   Generalized Body Aches    HPI [name] is a 73 y.o. female.   73 year old female who presents to urgent care with complaints of nonproductive cough, body aches, left posterior thoracic pain(from coughing and muscle strain) and right elbow pain.  She reports that her symptoms started on Sunday.  She reports that she feels like she has the flu.  She is having some congestion as well.  She reports that the elbow pain is more like a generalized body ache and denies any injury to the elbow.  She reports that the cough is constant but is not productive.  She denies any sick contacts, fevers, chills, shortness of breath, chest pain, nausea, vomiting, diarrhea, dysuria, hematuria.  She was at a reunion last weekend but does not recall anyone being sick and is unsure if anyone has been sick since then     Past Medical History:  Diagnosis Date   Abnormal EKG 2012   hospitalized for T wave inversion in lateral leads with MSK chest pains, normal ECHO, negative trops, no cardiology consult. recent EKG 7/15 stable T wave inversions.    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   Arthritis 2000   Arthritis 2011   Bilateral leg pain 06/14/2015   Clotting disorder (HCC)    blood clot kidney 2017   Heart murmur    Hyperlipidemia 2011   Hypertension 2011   Left elbow pain 03/31/2020   Meniscus tear 2000   L KNEE   Pelvic pain 10/07/2018   Renal infarct (HCC) 2017   blood clot in the kidney    Patient Active Problem List   Diagnosis Date Noted   Stomach discomfort 01/05/2023   Acute cough 08/10/2022   Leukocytosis 08/10/2022   Scoliosis of lumbar spine 02/28/2022   Candida glabrata infection 02/28/2022   Daytime somnolence 01/12/2021   Atypical chest pain 12/13/2020   Abnormal cardiac CT angiography 12/13/2020   Contact dermatitis  01/14/2019   Vaginal itching 12/05/2018   Leg edema 08/28/2018   Thickened endometrium    Umbilical hernia without obstruction and without gangrene 01/01/2018   Osteopenia 09/05/2017   Closed nondisplaced spiral fracture of shaft of right tibia 03/28/2017   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/30/2015   HLD (hyperlipidemia)    Aortic atherosclerosis (HCC)    Back pain 07/08/2015   Scotoma of blind spot area in visual field 06/14/2015   Seasonal allergies 03/09/2015   Flank pain 03/09/2015   Hypopigmentation 03/09/2015   Unilateral primary osteoarthritis, left knee 07/08/2014   DJD (degenerative joint disease), lumbar 07/08/2014   Osteoarthritis of both knees 07/08/2014   Urinary frequency 06/11/2014   Nearsightedness 04/27/2014   Abnormal EKG    Hypertension 12/11/2013    Past Surgical History:  Procedure Laterality Date   ABDOMINAL SURGERY  2010   for bowel blockage   COLON SURGERY  2010   bowel blockage   COLONOSCOPY  09/2014   HYSTEROSCOPY WITH D & C N/A 05/08/2018   Procedure: DILATATION AND CURETTAGE /HYSTEROSCOPY;  Surgeon: Verlyn Goad, MD;  Location: [facility];  Service: Gynecology;  Laterality: N/A;   KNEE ARTHROSCOPY Left 09/25/2013   Procedure: LEFT KNEE ARTHROSCOPY WITH PARTIAL MEDIAL AND LATERAL  MENISCECTOMY/DEBRIDEMENT/MICRO FRACTURE MEDIAL FEMORAL CONDYLE, REMOVAL OF OSTEOCONDRAL FRAGMENT;  Surgeon: Loel Ring,  MD;  Location: [facility];  Service: Orthopedics;  Laterality: Left;   KNEE SURGERY Right 1999   LEFT HEART CATH AND CORONARY ANGIOGRAPHY N/A 12/13/2020   Procedure: LEFT HEART CATH AND CORONARY ANGIOGRAPHY;  Surgeon: Sammy Crisp, MD;  Location: [facility];  Service: Cardiovascular;  Laterality: N/A;   POLYPECTOMY     TEE WITHOUT CARDIOVERSION N/A 10/04/2015   Procedure: TRANSESOPHAGEAL ECHOCARDIOGRAM (TEE);  Surgeon: Jacqueline Matsu, MD;  Location: [facility];  Service: Cardiovascular;  Laterality:  N/A;   TUBAL LIGATION  1978    OB History     Gravida  2   Para  2   Term      Preterm      AB      Living  2      SAB      IAB      Ectopic      Multiple      Live Births               Home Medications    Prior to Admission medications   Medication Sig Start Date End Date Taking? Authorizing Provider  azithromycin  (ZITHROMAX ) 250 MG tablet Take first 2 tablets together, then 1 every day until finished. 08/14/23  Yes Elliot Meldrum A, PA-C  promethazine -dextromethorphan (PROMETHAZINE -DM) 6.25-15 MG/5ML syrup Take 5 mLs by mouth every 8 (eight) hours as needed for cough. 08/14/23  Yes Briscoe Daniello A, PA-C  amLODipine  (NORVASC ) 5 MG tablet TAKE 1 TABLET(5 MG) BY MOUTH DAILY 08/08/23   Goble Last, MD  aspirin  EC 81 MG tablet Take 1 tablet (81 mg total) daily by mouth. 01/31/17   Jamas Maywood, PA-C  atorvastatin  (LIPITOR) 80 MG tablet TAKE 1 TABLET(80 MG) BY MOUTH DAILY AT 6 PM 09/04/22   Wendie Hamburg, MD  azelastine  (ASTELIN ) 0.1 % nasal spray Place 1 spray into both nostrils 2 (two) times daily. Use in each nostril as directed 06/18/23   Reddick, Johnathan B, NP  carvedilol  (COREG ) 12.5 MG tablet TAKE 1 TABLET(12.5 MG) BY MOUTH TWICE DAILY WITH A MEAL 08/14/23   Goble Last, MD  ezetimibe  (ZETIA ) 10 MG tablet Take 1 tablet (10 mg total) by mouth daily. 01/29/23 04/29/23  Wendie Hamburg, MD  fluticasone  (FLONASE ) 50 MCG/ACT nasal spray SHAKE LIQUID AND USE 2 SPRAYS IN EACH NOSTRIL TWICE DAILY 07/07/22   Goble Last, MD  furosemide  (LASIX ) 40 MG tablet Take 1 tablet (40 mg total) by mouth daily. 09/14/22 09/14/23  Goble Last, MD  gabapentin  (NEURONTIN ) 300 MG capsule Take 1 capsule (300 mg total) by mouth 2 (two) times daily. 03/24/23   Dalene Duck, MD  hydrochlorothiazide  (HYDRODIURIL ) 25 MG tablet Take 1 tablet (25 mg total) by mouth daily. 03/24/23   Dalene Duck, MD  isosorbide  mononitrate (IMDUR ) 60 MG 24 hr tablet TAKE 1  TABLET(60 MG) BY MOUTH DAILY 09/04/22   Wendie Hamburg, MD  ketoconazole (NIZORAL) 2 % cream Apply 1 Application topically as needed. 11/13/22   [provider]  ketoconazole (NIZORAL) 2 % shampoo Apply 1 Application topically as needed. 11/13/22   [provider]  losartan  (COZAAR ) 100 MG tablet TAKE 1 TABLET(100 MG) BY MOUTH DAILY 08/07/22   Goble Last, MD  Magnesium  Oxide, Elemental, 400 MG TABS Take 400 mg by mouth 2 (two) times daily. 07/26/23   Wendie Hamburg, MD  Multiple Vitamin (MULITIVITAMIN WITH MINERALS) TABS Take 1 tablet by mouth daily.    [provider]  nitroGLYCERIN  (NITROSTAT ) 0.4 MG SL tablet DISSOLVE ONE TABLET UNDER TONGUE AS NEEDED FOR CHEST PAIN EVERY 5 MINUTES 08/08/23   Wendie Hamburg, MD  potassium chloride  SA (KLOR-CON  M) 20 MEQ tablet TAKE 1 TABLET(20 MEQ) BY MOUTH TWICE DAILY 07/10/23   Goble Last, MD    Family History Family History  Problem Relation Age of Onset   Hypertension Mother    Heart disease Mother        has a pacemaker    Alzheimer's disease Mother    Hyperlipidemia Mother    Osteoporosis Mother    Heart failure Father    Hypertension Father    Alzheimer's disease Father    Hypertension Sister    Breast cancer Sister    Hypertension Sister    Gout Sister    Heart disease Son    Alcohol abuse Son    Pancreatitis Son    Hypertension Son    Cancer Neg Hx    Colon cancer Neg Hx    Esophageal cancer Neg Hx    Stomach cancer Neg Hx    Rectal cancer Neg Hx     Social History Social History   Tobacco Use   Smoking status: Former    Current packs/day: 0.00    Types: Cigarettes    Quit date: 07/29/2010    Years since quitting: 13.0    Passive exposure: Past   Smokeless tobacco: Never  Vaping Use   Vaping status: Never Used  Substance Use Topics   Alcohol use: Not Currently   Drug use: Never     Allergies   Shrimp [shellfish allergy], Latex, and Penicillins   Review of  Systems Review of Systems  Constitutional:  Negative for chills and fever.  HENT:  Positive for congestion. Negative for ear pain and sore throat.   Eyes:  Negative for pain and visual disturbance.  Respiratory:  Positive for cough. Negative for shortness of breath.   Cardiovascular:  Negative for chest pain and palpitations.  Gastrointestinal:  Negative for abdominal pain and vomiting.  Genitourinary:  Negative for dysuria and hematuria.  Musculoskeletal:  Negative for arthralgias and back pain.       Left thoracic back pain, right elbow pain, generalized bodyaches  Skin:  Negative for color change and rash.  Neurological:  Negative for seizures, syncope and headaches.  All other systems reviewed and are negative.    Physical Exam Triage Vital Signs ED Triage Vitals  Encounter Vitals Group     BP 08/14/23 1631 (!) 171/77     Systolic BP Percentile --      Diastolic BP Percentile --      Pulse Rate 08/14/23 1631 65     Resp 08/14/23 1631 16     Temp 08/14/23 1631 98.3 F (36.8 C)     Temp Source 08/14/23 1631 Oral     SpO2 08/14/23 1631 97 %     Weight --      Height --      Head Circumference --      Peak Flow --      Pain Score 08/14/23 1625 8     Pain Loc --      Pain Education --      Exclude from Growth Chart --    No data found.  Updated Vital Signs BP (!) 171/77 (BP Location: Left Arm)   Pulse 65   Temp 98.3 F (36.8 C) (Oral)   Resp 16   SpO2 97%  Visual Acuity Right Eye Distance:   Left Eye Distance:   Bilateral Distance:    Right Eye Near:   Left Eye Near:    Bilateral Near:     Physical Exam Vitals and nursing note reviewed.  Constitutional:      General: She is not in acute distress.    Appearance: She is well-developed.  HENT:     Head: Normocephalic and atraumatic.  Eyes:     Conjunctiva/sclera: Conjunctivae normal.  Cardiovascular:     Rate and Rhythm: Normal rate and regular rhythm.     Heart sounds: No murmur heard. Pulmonary:      Effort: Pulmonary effort is normal. No respiratory distress.     Breath sounds: Examination of the right-middle field reveals decreased breath sounds. Examination of the left-middle field reveals decreased breath sounds. Examination of the right-lower field reveals decreased breath sounds. Examination of the left-lower field reveals decreased breath sounds. Decreased breath sounds present. No wheezing or rhonchi.  Abdominal:     Palpations: Abdomen is soft.     Tenderness: There is no abdominal tenderness.  Musculoskeletal:        General: No swelling.     Cervical back: Neck supple.     Thoracic back: Tenderness (mild left lateral) present. No spasms. Normal range of motion.     Lumbar back: No swelling, tenderness or bony tenderness. Normal range of motion.  Skin:    General: Skin is warm and dry.     Capillary Refill: Capillary refill takes less than 2 seconds.  Neurological:     Mental Status: She is alert.  Psychiatric:        Mood and Affect: Mood normal.      UC Treatments / Results  Labs (all labs ordered are listed, but only abnormal results are displayed) Labs Reviewed  POC COVID19/FLU A&B COMBO - Normal    EKG   Radiology DG Chest 2 View Result Date: 08/14/2023 CLINICAL DATA:  Cough. EXAM: CHEST - 2 VIEW COMPARISON:  March 24, 2023. FINDINGS: The heart size and mediastinal contours are within normal limits. Elevated left hemidiaphragm. Both lungs are clear. The visualized skeletal structures are unremarkable. IMPRESSION: No active cardiopulmonary disease. Electronically Signed   By: Rosalene Colon M.D.   On: 08/14/2023 18:29    Procedures Procedures (including critical care time)  Medications Ordered in UC Medications  ketorolac  (TORADOL ) 30 MG/ML injection 15 mg (15 mg Intramuscular Given 08/14/23 1947)    Initial Impression / Assessment and Plan / UC Course  I have reviewed the triage vital signs and the nursing notes.  Pertinent labs & imaging results  that were available during my care of the patient were reviewed by me and considered in my medical decision making (see chart for details).     Acute cough - Plan: DG Chest 2 View, DG Chest 2 View  Lower respiratory infection   Flu A, Flu B and covid testing done today and negative.  Chest x-ray done today.  Final evaluation by the radiologist shows no pneumonia.  Given the symptoms and physical exam findings along with the x-ray findings, concern for a lower respiratory infection.  The left flank pain does seem to be more musculoskeletal in nature and likely has worsened due to coughing.  There are no urinary symptoms or radiating symptoms.  We will treat with the following: Azithromycin  250mg  Take 2 tablets today and the 1 tablet daily for 4 more days. Benzonatate  (tessalon ) 100 mg every 8  hours as needed for cough.  Initially the patient had requested Promethazine  DM but given her elevated blood pressure we have advised her to not pick up the cough syrup medication if it is present at her pharmacy as this medication can cause an elevation in her blood pressure Toradol  injection given today. This is a medication to help with pain. This is not a narcotic.   Monitor your blood pressure at home as it has been elevated here in the office which may likely be from pain.  We have given you a shot today to help with the pain.  If your pressure continues to be high then recommend following up with your primary care physician. May use a heating pad on the back to help with the muscle soreness. May use tylenol  for pain Rest and stay hydrated.   Return to urgent care or PCP if symptoms worsen or fail to resolve.    Final Clinical Impressions(s) / UC Diagnoses   Final diagnoses:  Acute cough  Lower respiratory infection     Discharge Instructions      Flu A, Flu B and covid testing done today and negative.  Chest x-ray done today.  Final evaluation by the radiologist shows no pneumonia.  Given the  symptoms and physical exam findings along with the x-ray findings, we will treat with the following: Azithromycin  250mg  Take 2 tablets today and the 1 tablet daily for 4 more days. Benzonatate  (tessalon ) 100 mg every 8 hours as needed for cough.  Do not pick up the cough syrup medication if it is present at your pharmacy as this medication can cause an elevation in your blood pressure Toradol  injection given today. This is a medication to help with pain. This is not a narcotic.   May use a heating pad on your back to help with the muscle soreness. May use tylenol  for pain Rest and stay hydrated.   Return to urgent care or PCP if symptoms worsen or fail to resolve.     ED Prescriptions     Medication Sig Dispense Auth. Provider   azithromycin  (ZITHROMAX ) 250 MG tablet Take first 2 tablets together, then 1 every day until finished. 6 tablet Liylah Najarro A, PA-C   promethazine -dextromethorphan (PROMETHAZINE -DM) 6.25-15 MG/5ML syrup Take 5 mLs by mouth every 8 (eight) hours as needed for cough. 180 mL Kreg Pesa, [place]      PDMP not reviewed this encounter.   Acie Acosta 08/14/23 1846    Kreg Pesa, PA-C 08/14/23 1947    Kreg Pesa, PA-C 08/14/23 1948

## 2023-08-14 NOTE — ED Triage Notes (Signed)
 Patient here today with c/o body aches, left side flank pain, and cough since last Sunday.

## 2023-08-14 NOTE — Discharge Instructions (Addendum)
 Flu A, Flu B and covid testing done today and negative.  Chest x-ray done today.  Final evaluation by the radiologist shows no pneumonia.  Given the symptoms and physical exam findings along with the x-ray findings, we will treat with the following: Azithromycin  250mg  Take 2 tablets today and the 1 tablet daily for 4 more days. Benzonatate  (tessalon ) 100 mg every 8 hours as needed for cough.  Do not pick up the cough syrup medication if it is present at your pharmacy as this medication can cause an elevation in your blood pressure Toradol  injection given today. This is a medication to help with pain. This is not a narcotic.   May use a heating pad on your back to help with the muscle soreness. May use tylenol  for pain Rest and stay hydrated.   Return to urgent care or PCP if symptoms worsen or fail to resolve.

## 2023-08-15 ENCOUNTER — Telehealth (HOSPITAL_COMMUNITY): Payer: Self-pay | Admitting: *Deleted

## 2023-08-15 NOTE — Telephone Encounter (Signed)
 Pt states that her benzonatate  isnt covered by her insurance and she would like to know if you can call in a different med for cough.

## 2023-08-15 NOTE — Telephone Encounter (Signed)
 Spoke with provider, White, she states that due to hypertension the benzonatate  called in or the OTC coricidin are the only two meds same to use for cough. Pt advised she states she will get OTC meds.

## 2023-08-17 ENCOUNTER — Other Ambulatory Visit: Payer: Self-pay | Admitting: Student

## 2023-08-27 ENCOUNTER — Ambulatory Visit (INDEPENDENT_AMBULATORY_CARE_PROVIDER_SITE_OTHER): Admitting: Podiatry

## 2023-08-27 ENCOUNTER — Encounter: Payer: Self-pay | Admitting: Podiatry

## 2023-08-27 VITALS — Ht 65.0 in | Wt 195.0 lb

## 2023-08-27 DIAGNOSIS — L600 Ingrowing nail: Secondary | ICD-10-CM

## 2023-08-27 NOTE — Progress Notes (Signed)
 Subjective:   Patient ID: [name], female   DOB: 73 y.o.   MRN: [id number]   HPI Patient presents stating that she is concerned about nailbed possible ingrown toenail fifth left and around the left hallux and wants to have this checked and addressed as possible   ROS      Objective:  Physical Exam  Neurovascular status was found to be intact muscle strength adequate range of motion adequate with incurvated left fifth nail medial side tender and left hallux moderate.  No erythema edema or drainage noted     Assessment:  Ingrown toenail deformity that is aggravating localized     Plan:  H&P reviewed at this point I have recommended conservative care with aggressive care courtesy trimming advised on soaks wider shoes and anti-inflammatories over-the-counter as needed.  Patient will be seen back and may require permanent procedure if symptoms persist

## 2023-09-06 ENCOUNTER — Other Ambulatory Visit (HOSPITAL_COMMUNITY): Payer: Self-pay

## 2023-09-06 ENCOUNTER — Telehealth: Payer: Self-pay | Admitting: Pharmacist

## 2023-09-06 ENCOUNTER — Ambulatory Visit: Admitting: Student

## 2023-09-06 ENCOUNTER — Telehealth: Payer: Self-pay | Admitting: Pharmacy Technician

## 2023-09-06 ENCOUNTER — Encounter: Payer: Self-pay | Admitting: Pharmacist

## 2023-09-06 ENCOUNTER — Ambulatory Visit: Attending: Cardiology | Admitting: Pharmacist

## 2023-09-06 VITALS — BP 138/62 | HR 71 | Ht 65.0 in | Wt 207.0 lb

## 2023-09-06 DIAGNOSIS — R931 Abnormal findings on diagnostic imaging of heart and coronary circulation: Secondary | ICD-10-CM

## 2023-09-06 DIAGNOSIS — M25552 Pain in left hip: Secondary | ICD-10-CM

## 2023-09-06 DIAGNOSIS — E785 Hyperlipidemia, unspecified: Secondary | ICD-10-CM | POA: Diagnosis not present

## 2023-09-06 DIAGNOSIS — M25521 Pain in right elbow: Secondary | ICD-10-CM | POA: Diagnosis not present

## 2023-09-06 DIAGNOSIS — I2510 Atherosclerotic heart disease of native coronary artery without angina pectoris: Secondary | ICD-10-CM

## 2023-09-06 NOTE — Progress Notes (Signed)
 SUBJECTIVE:   CHIEF COMPLAINT / HPI:   Left hip pain Chronic low back pain, with worsening left hip pain for 1 week.  Pain radiates down to but does not past the knee.  She reports no increased physical activity, no recent trauma.  No weakness, fevers.  She has been using Tylenol .  Reports infrequent use of her gabapentin , only as needed.  Right elbow pain Acute right elbow pain.  Over olecranon.  Denies recent trauma.  No recent increase in activity.  No new activities.  No weakness, no radiation, no swelling, no radiation to hand.  OBJECTIVE:   BP 138/62   Pulse 71   Ht 5' 5 (1.651 m)   Wt 207 lb (93.9 kg)   SpO2 96%   BMI 34.45 kg/m    General: NAD, pleasant Cardio: RRR, no MRG. Cap Refill <2s. Respiratory: CTAB, normal wob on RA GI: Abdomen is soft, not tender, not distended. BS present MSK: Bilateral hips: No gross deformity. No TTP.  Reduced internal and external rotation bilaterally, with hip pain on left with FADIR/FABER.  Negative straight leg. Low back: No tenderness to spinous processes.  Left-sided paraspinal muscle tenderness.  ASSESSMENT/PLAN:   Assessment & Plan Pain of left hip Strongly suspect hip arthritis.  Differential includes: Lumbar strain, sciatica, IT band syndrome, referred pain from known knee arthritis. - Will obtain hip x-ray, and right hip x-ray for comparison - Unfortunately not a good candidate for NSAIDs - Has had intermittent tramadol  prescribed, but do not believe this is a good long-term solution for her.  After reviewing orthopedic notes, she likely needs injections or replacement. - Recommend Tylenol , lidocaine  patch, Aspercreme - Recommend PT, patient declines - Recommend follow-up with orthopedic surgery for consideration of joint injection Right elbow pain Differential includes: Arthritis, olecranon bursitis.  Lower concern for fracture without trauma. - Recommend therapies as above for pain control - Recommend compression  sleeve - Recommend follow-up with orthopedics    At the end of the visit, patient expressed frustration.  Raised voice at physician.   She expressed frustration that she came here for help, but is not receiving the help that she believes that she needs. When I inquired what she is requesting, she requests stronger pain medications.  I discussed that she is not a good candidate for NSAIDs, and that ultimately I think she will need an injection of her hip and possibly elbow.  I discussed that long-term opioid use would not be a good option for her, she been previously prescribed tramadol  which mask her symptoms.  I recommended OTC therapies as above, and additionally offered referral to physical therapy.  Again, patient refused the services. She stated that she should no longer come here when she is in pain.  I reminded the patient that we are here for her and do care about her pain and that it is always a good idea to be examined for more emergent problems. I stated that if she is having MSK problems related to her chronic MSK disease states, that her orthopedic surgeon is also available to see her and I do recommend follow-up as above. Again patient became very frustrated, would not engage with physician.  Patient crying and raised voice at physician again.  At this time I tried to diffuse the situation, but was ultimately unsuccessful-I then excused myself and handed patient her AVS paperwork with all relevant details circled including the number for orthopedic surgeon and where she can obtain her hip x-rays.  She then slammed her fist against the hallway as she was walking out the door.  There was no physical aggression towards physician or staff.  Lavada Porteous, DO [facility]

## 2023-09-06 NOTE — Patient Instructions (Addendum)
 It was great to see you! Thank you for allowing me to participate in your care!   I recommend that you always bring your medications to each appointment as this makes it easy to ensure we are on the correct medications and helps us  not miss when refills are needed.  Our plans for today:  - I recommend lidocaine  patches that you can pick up at the pharmacy.  Additionally can try Aspercreme for pain. - I recommend a compression sleeve for your elbow - Please follow-up with your orthopedic practice, number provided: [phone]   - You can continue to take your gabapentin  and over-the-counter Tylenol .  You can take 1000 mg Tylenol  every 6 hours. - I have ordered an x-ray, see below - Please follow-up in 2 weeks for your blood pressure  An x-ray was ordered for you---you do not need an appointment to have this completed.  I recommend going to [facility] [address] [place]  If the results are normal,I will send you a letter  I will call you with results if anything is abnormal   Take care and seek immediate care sooner if you develop any concerns. Please remember to show up 15 minutes before your scheduled appointment time!  Lavada Porteous, DO [facility]

## 2023-09-06 NOTE — Patient Instructions (Addendum)
 Good seeing you again  We would like your LDL (bad cholesterol) to be less than 70  Please continue your atorvastatin  80mg  once a day and your ezetimibe  10mg  daily  The medication we discussed today is called Praluent, which is an injection you would take once every 2 weeks  I will complete the prior authorization for you and contact you when it is approved  Once you start the medication we will recheck your fasting lipid panel in 3 months  Please let us  know if you have any questions  Joelene Murrain, PharmD, BCACP, CDCES, CPP [facility] [address] [place], [place] [address] Phone: [phone]; Fax: [phone] 09/06/2023 9:55 AM

## 2023-09-06 NOTE — Telephone Encounter (Signed)
 Pharmacy Patient Advocate Encounter  Received notification from [facility] that Prior Authorization for  Praluent 75MG /ML auto-injectors has been APPROVED from 09/06/23 to 03/26/24. Ran test claim, Copay is $0.00. This test claim was processed through [facility]- copay amounts may vary at other pharmacies due to pharmacy/plan contracts, or as the patient moves through the different stages of their insurance plan.   PA #/Case ID/Reference #:  [id number]

## 2023-09-06 NOTE — Progress Notes (Signed)
 Patient ID: [name]                 DOB: Nov 13, 1950                    MRN: [id number]     HPI: [name] is a 73 y.o. female patient referred to lipid clinic by Dr Alda Amas. PMH is significant for HTN, aortic atherosclerosis, elevated coronary calcium  score, CHF, renal infarct, and HLD.  Patient currently managed on atorvastatin  80mg  once daily and ezetimibe  10mg  once daily.  Had cardiac cath in 2022 which showed CAD and calcium  score of 936.  Feels well today. Denies chest pain or SOB,  Current Medications:  Atorvastatin  80mg  daily Ezetimibe  10mg  daily  Intolerances: N/A  Risk Factors:  CAD Coronary Calcium  CHF  LDL goal: <70  Labs:TC 187, Trigs 126, HDL 60, LDL 105  (07/05/23)  Imaging: MPRESSION: 1. Moderate CAD in mid and distal RCA, CADRADS =3. CT FFR will be performed and reported separately.   2. Coronary calcium  score is 936, which places the patient in the 97th percentile for age and sex matched control.  Past Medical History:  Diagnosis Date   Abnormal EKG 2012   hospitalized for T wave inversion in lateral leads with MSK chest pains, normal ECHO, negative trops, no cardiology consult. recent EKG 7/15 stable T wave inversions.    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   Arthritis 2000   Arthritis 2011   Bilateral leg pain 06/14/2015   Clotting disorder (HCC)    blood clot kidney 2017   Heart murmur    Hyperlipidemia 2011   Hypertension 2011   Left elbow pain 03/31/2020   Meniscus tear 2000   L KNEE   Pelvic pain 10/07/2018   Renal infarct (HCC) 2017   blood clot in the kidney    Current Outpatient Medications on File Prior to Visit  Medication Sig Dispense Refill   amLODipine  (NORVASC ) 5 MG tablet TAKE 1 TABLET(5 MG) BY MOUTH DAILY 90 tablet 0   aspirin  EC 81 MG tablet Take 1 tablet (81 mg total) daily by mouth. 30 tablet 11   atorvastatin  (LIPITOR) 80 MG tablet TAKE 1 TABLET(80 MG) BY MOUTH DAILY AT 6 PM 90 tablet 3   azelastine  (ASTELIN )  0.1 % nasal spray Place 1 spray into both nostrils 2 (two) times daily. Use in each nostril as directed 30 mL 1   azithromycin  (ZITHROMAX ) 250 MG tablet Take first 2 tablets together, then 1 every day until finished. 6 tablet 0   benzonatate  (TESSALON ) 100 MG capsule Take 1 capsule (100 mg total) by mouth every 8 (eight) hours. 21 capsule 0   carvedilol  (COREG ) 12.5 MG tablet TAKE 1 TABLET(12.5 MG) BY MOUTH TWICE DAILY WITH A MEAL 60 tablet 2   ezetimibe  (ZETIA ) 10 MG tablet Take 1 tablet (10 mg total) by mouth daily. 90 tablet 3   fluticasone  (FLONASE ) 50 MCG/ACT nasal spray SHAKE LIQUID AND USE 2 SPRAYS IN EACH NOSTRIL TWICE DAILY 16 g 1   furosemide  (LASIX ) 40 MG tablet Take 1 tablet (40 mg total) by mouth daily. 30 tablet 9   gabapentin  (NEURONTIN ) 300 MG capsule Take 1 capsule (300 mg total) by mouth 2 (two) times daily. 30 capsule 0   hydrochlorothiazide  (HYDRODIURIL ) 25 MG tablet Take 1 tablet (25 mg total) by mouth daily. 30 tablet 0   isosorbide  mononitrate (IMDUR ) 60 MG 24 hr tablet TAKE 1 TABLET(60 MG) BY MOUTH DAILY 90 tablet 3  ketoconazole (NIZORAL) 2 % cream Apply 1 Application topically as needed.     ketoconazole (NIZORAL) 2 % shampoo Apply 1 Application topically as needed.     losartan  (COZAAR ) 100 MG tablet TAKE 1 TABLET(100 MG) BY MOUTH DAILY 90 tablet 3   Magnesium  Oxide, Elemental, 400 MG TABS Take 400 mg by mouth 2 (two) times daily. 90 tablet 3   Multiple Vitamin (MULITIVITAMIN WITH MINERALS) TABS Take 1 tablet by mouth daily.     nitroGLYCERIN  (NITROSTAT ) 0.4 MG SL tablet DISSOLVE ONE TABLET UNDER TONGUE AS NEEDED FOR CHEST PAIN EVERY 5 MINUTES 25 tablet 11   potassium chloride  SA (KLOR-CON  M) 20 MEQ tablet TAKE 1 TABLET(20 MEQ) BY MOUTH TWICE DAILY 180 tablet 0   No current facility-administered medications on file prior to visit.    Allergies  Allergen Reactions   Shrimp [Shellfish Allergy] Hives and Itching   Latex Itching   Penicillins Itching and Rash    Has  patient had a PCN reaction causing immediate rash, facial/tongue/throat swelling, SOB or lightheadedness with hypotension: unknown Has patient had a PCN reaction causing severe rash involving mucus membranes or skin necrosis: unknown Has patient had a PCN reaction that required hospitalization unknown Has patient had a PCN reaction occurring within the last 10 years: No If all of the above answers are NO, then may proceed with Cephalosporin use.    Assessment/Plan:  1. Hyperlipidemia - Patient last LDL 105 which is above goal of <70 despite high intensity statin and ezetimibe . Due to CAD and elevated coronary calcium  score, recommend addition of PCSK9i.  Using demo pen, educated patient on mechanism of action, storage, site selection, administration, and possible adverse effects. Will complete PA for Praluent as patient has a documented latex allergy. Recheck fasting lipid panel in 3 months.  Continue: Atorvastatin  80mg  daily Ezetimibe  10mg  daily Start Praluent 75mg  q 2 weeks Recheck lipid panel in 3 months  Joelene Murrain, PharmD, BCACP, CDCES, CPP [facility] [address] [place], [place] [address] Phone: [phone]; Fax: [phone] 09/06/2023 10:07 AM

## 2023-09-06 NOTE — Telephone Encounter (Signed)
 Please complete PA for Praluent. Patient has latex allergy

## 2023-09-06 NOTE — Telephone Encounter (Signed)
 Pharmacy Patient Advocate Encounter   Received notification from Pt Calls [employer]ages[facility] that prior authorization for Praluent 75mg  is required/requested.   Insurance verification completed.   The patient is insured through [employer] .   Per test claim: PA required; PA submitted to above mentioned insurance via CoverMyMeds Key/confirmation #/EOC [id number] Status is pending

## 2023-09-07 ENCOUNTER — Telehealth: Payer: Self-pay | Admitting: Family Medicine

## 2023-09-07 NOTE — Telephone Encounter (Signed)
-----   Message from Lavada Porteous sent at 09/06/2023  4:57 PM EDT ----- Regarding: Inappropriate patient behavior Please see note from 09/06/2023. Documented an inappropriate behavior from patient. For context she did some similar, but less extreme in January and I prescribed her a 1 time Tramadol  as a result. This time I would not prescribe opiate medication, and her behavior escalated.   Thanks, [employer]

## 2023-09-07 NOTE — Telephone Encounter (Signed)
 In addition to Dr. Genell Ken documentation, the patient submitted a visit complaint form to clinic staff.  She was contacted by phone today. I introduced myself and [name], and the patient confirmed her name and date of birth.  She expressed frustration regarding her pain management plan, which she felt was not adequately addressed. I discussed Dr. Genell Ken feedback with her regarding her behavior; the patient denied punching the wall or acting inappropriately.  I empathized with her, acknowledged her concerns about chronic pain, and explained our clinic's policy on respectful conduct toward staff, providers, and other patients. She verbalized understanding that future inappropriate behavior may lead to dismissal from the practice.  An appointment was scheduled with her PCP to revisit her pain management plan. She expressed appreciation for the outreach.

## 2023-09-10 ENCOUNTER — Ambulatory Visit: Admitting: Student

## 2023-09-13 MED ORDER — PRALUENT 75 MG/ML ~~LOC~~ SOAJ: 75.0000 mg | SUBCUTANEOUS | 5 refills | Status: AC

## 2023-09-13 NOTE — Addendum Note (Signed)
 Addended by: Sunny English on: 09/13/2023 01:20 PM   Modules accepted: Orders

## 2023-09-17 ENCOUNTER — Ambulatory Visit: Payer: 59 | Admitting: Podiatry

## 2023-09-20 ENCOUNTER — Ambulatory Visit

## 2023-09-20 ENCOUNTER — Telehealth: Payer: Self-pay

## 2023-09-20 VITALS — Ht 65.0 in | Wt 197.0 lb

## 2023-09-20 DIAGNOSIS — Z0000 Encounter for general adult medical examination without abnormal findings: Secondary | ICD-10-CM | POA: Diagnosis not present

## 2023-09-20 NOTE — Telephone Encounter (Signed)
 During AWV with patient today, she stated that she was still having pain (level 10) in her back.  Also she stated that she is still having severe vaginal itching and that it is very uncomfortable.  Patient stated that she had a normal pap smear, has tried several measures and no vaginal discharge.  She has even tried boric acid.  Patient would like to speak with pcp personally and thinks that she should be referred to an GYN to evaluate her more.  She is feeling very uncomfortable.  CB# [phone].  Silver Parkey N. Tomie, LPN [facility] Annual Wellness Team Direct Dial: [phone]

## 2023-09-20 NOTE — Patient Instructions (Signed)
 [name] , Thank you for taking time out of your busy schedule to complete your Annual Wellness Visit with me. I enjoyed our conversation and look forward to speaking with you again next year. I, as well as your care team,  appreciate your ongoing commitment to your health goals. Please review the following plan we discussed and let me know if I can assist you in the future. Your Game plan/ To Do List    Referrals: If you haven't heard from the office you've been referred to, please reach out to them at the phone provided.   Follow up Visits: Next Medicare AWV with our clinical staff: 09/25/2024 at 3:30p phone visit with Nurse Health Advisor   Have you seen your provider in the last 6 months (3 months if uncontrolled diabetes)? Yes Next Office Visit with your provider: Patient will call to schedule.  Clinician Recommendations:  Aim for 30 minutes of exercise or brisk walking, 6-8 glasses of water, and 5 servings of fruits and vegetables each day.       This is a list of the screening recommended for you and due dates:  Health Maintenance  Topic Date Due   Zoster (Shingles) Vaccine (1 of 2) Never done   COVID-19 Vaccine (7 - Pfizer risk 2024-25 season) 07/06/2023   Flu Shot  10/26/2023   Mammogram  04/08/2024   Medicare Annual Wellness Visit  09/19/2024   DTaP/Tdap/Td vaccine (2 - Td or Tdap) 09/11/2025   Colon Cancer Screening  12/29/2025   Pneumococcal Vaccine for age over 65  Completed   DEXA scan (bone density measurement)  Completed   Hepatitis C Screening  Completed   Hepatitis B Vaccine  Aged Out   HPV Vaccine  Aged Out   Meningitis B Vaccine  Aged Out    Advanced directives: (Declined) Advance directive discussed with you today. Even though you declined this today, please call our office should you change your mind, and we can give you the proper paperwork for you to fill out. Advance Care Planning is important because it:  [x]  Makes sure you receive the medical care that is  consistent with your values, goals, and preferences  [x]  It provides guidance to your family and loved ones and reduces their decisional burden about whether or not they are making the right decisions based on your wishes.  Follow the link provided in your after visit summary or read over the paperwork we have mailed to you to help you started getting your Advance Directives in place. If you need assistance in completing these, please reach out to us  so that we can help you!  See attachments for Preventive Care and Fall Prevention Tips.

## 2023-09-20 NOTE — Progress Notes (Signed)
 Because this visit was a virtual/telehealth visit,  certain criteria was not obtained, such a blood pressure, CBG if applicable, and timed get up and go. Any medications not marked as taking were not mentioned during the medication reconciliation part of the visit. Any vitals not documented were not able to be obtained due to this being a telehealth visit or patient was unable to self-report a recent blood pressure reading due to a lack of equipment at home via telehealth. Vitals that have been documented are verbally provided by the patient.   Subjective:   [name] is a 73 y.o. who presents for a Medicare Wellness preventive visit.  As a reminder, Annual Wellness Visits don't include a physical exam, and some assessments may be limited, especially if this visit is performed virtually. We may recommend an in-person follow-up visit with your provider if needed.  Visit Complete: Virtual I connected with  [name] on 09/20/23 by a audio enabled telemedicine application and verified that I am speaking with the correct person using two identifiers.  Patient Location: Home  Provider Location: Office/Clinic  I discussed the limitations of evaluation and management by telemedicine. The patient expressed understanding and agreed to proceed.  Vital Signs: Because this visit was a virtual/telehealth visit, some criteria may be missing or patient reported. Any vitals not documented were not able to be obtained and vitals that have been documented are patient reported.  VideoDeclined- This patient declined Librarian, academic. Therefore the visit was completed with audio only.  Persons Participating in Visit: Patient.  AWV Questionnaire: No: Patient Medicare AWV questionnaire was not completed prior to this visit.  Cardiac Risk Factors include: advanced age (>66men, >24 women);dyslipidemia;hypertension;obesity (BMI >30kg/m2);family history of premature  cardiovascular disease     Objective:    Today's Vitals   09/20/23 1611  Weight: 197 lb (89.4 kg)  Height: 5' 5 (1.651 m)  PainSc: 10-Worst pain ever  PainLoc: Generalized   Body mass index is 32.78 kg/m.     09/20/2023    4:15 PM 03/29/2023    2:31 PM 03/24/2023    5:08 PM 09/05/2022    1:08 PM 08/25/2022   11:48 AM 08/15/2022    1:33 PM 08/09/2022   10:01 AM  Advanced Directives  Does Patient Have a Medical Advance Directive? No No No No No No No  Would patient like information on creating a medical advance directive? No - Patient declined No - Patient declined No - Patient declined    No - Patient declined    Current Medications (verified) Outpatient Encounter Medications as of 09/20/2023  Medication Sig   Alirocumab  (PRALUENT ) 75 MG/ML SOAJ Inject 1 mL (75 mg total) into the skin every 14 (fourteen) days.   amLODipine  (NORVASC ) 5 MG tablet TAKE 1 TABLET(5 MG) BY MOUTH DAILY   aspirin  EC 81 MG tablet Take 1 tablet (81 mg total) daily by mouth.   atorvastatin  (LIPITOR) 80 MG tablet TAKE 1 TABLET(80 MG) BY MOUTH DAILY AT 6 PM   azelastine  (ASTELIN ) 0.1 % nasal spray Place 1 spray into both nostrils 2 (two) times daily. Use in each nostril as directed   azithromycin  (ZITHROMAX ) 250 MG tablet Take first 2 tablets together, then 1 every day until finished.   benzonatate  (TESSALON ) 100 MG capsule Take 1 capsule (100 mg total) by mouth every 8 (eight) hours.   carvedilol  (COREG ) 12.5 MG tablet TAKE 1 TABLET(12.5 MG) BY MOUTH TWICE DAILY WITH A MEAL   ezetimibe  (ZETIA )  10 MG tablet Take 1 tablet (10 mg total) by mouth daily.   fluticasone  (FLONASE ) 50 MCG/ACT nasal spray SHAKE LIQUID AND USE 2 SPRAYS IN EACH NOSTRIL TWICE DAILY   furosemide  (LASIX ) 40 MG tablet Take 1 tablet (40 mg total) by mouth daily.   gabapentin  (NEURONTIN ) 300 MG capsule Take 1 capsule (300 mg total) by mouth 2 (two) times daily.   hydrochlorothiazide  (HYDRODIURIL ) 25 MG tablet Take 1 tablet (25 mg total) by  mouth daily.   isosorbide  mononitrate (IMDUR ) 60 MG 24 hr tablet TAKE 1 TABLET(60 MG) BY MOUTH DAILY   ketoconazole (NIZORAL) 2 % cream Apply 1 Application topically as needed.   ketoconazole (NIZORAL) 2 % shampoo Apply 1 Application topically as needed.   losartan  (COZAAR ) 100 MG tablet TAKE 1 TABLET(100 MG) BY MOUTH DAILY   Magnesium  Oxide, Elemental, 400 MG TABS Take 400 mg by mouth 2 (two) times daily.   Multiple Vitamin (MULITIVITAMIN WITH MINERALS) TABS Take 1 tablet by mouth daily.   nitroGLYCERIN  (NITROSTAT ) 0.4 MG SL tablet DISSOLVE ONE TABLET UNDER TONGUE AS NEEDED FOR CHEST PAIN EVERY 5 MINUTES   potassium chloride  SA (KLOR-CON  M) 20 MEQ tablet TAKE 1 TABLET(20 MEQ) BY MOUTH TWICE DAILY   No facility-administered encounter medications on file as of 09/20/2023.    Allergies (verified) Shrimp [shellfish allergy], Latex, and Penicillins   History: Past Medical History:  Diagnosis Date   Abnormal EKG 2012   hospitalized for T wave inversion in lateral leads with MSK chest pains, normal ECHO, negative trops, no cardiology consult. recent EKG 7/15 stable T wave inversions.    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   Arthritis 2000   Arthritis 2011   Bilateral leg pain 06/14/2015   Clotting disorder (HCC)    blood clot kidney 2017   Heart murmur    Hyperlipidemia 2011   Hypertension 2011   Left elbow pain 03/31/2020   Meniscus tear 2000   L KNEE   Pelvic pain 10/07/2018   Renal infarct (HCC) 2017   blood clot in the kidney   Past Surgical History:  Procedure Laterality Date   ABDOMINAL SURGERY  2010   for bowel blockage   COLON SURGERY  2010   bowel blockage   COLONOSCOPY  09/2014   HYSTEROSCOPY WITH D & C N/A 05/08/2018   Procedure: DILATATION AND CURETTAGE /HYSTEROSCOPY;  Surgeon: Alger Gong, MD;  Location: [facility];  Service: Gynecology;  Laterality: N/A;   KNEE ARTHROSCOPY Left 09/25/2013   Procedure: LEFT KNEE ARTHROSCOPY WITH PARTIAL MEDIAL AND  LATERAL  MENISCECTOMY/DEBRIDEMENT/MICRO FRACTURE MEDIAL FEMORAL CONDYLE, REMOVAL OF OSTEOCONDRAL FRAGMENT;  Surgeon: Reyes JAYSON Billing, MD;  Location: [facility];  Service: Orthopedics;  Laterality: Left;   KNEE SURGERY Right 1999   LEFT HEART CATH AND CORONARY ANGIOGRAPHY N/A 12/13/2020   Procedure: LEFT HEART CATH AND CORONARY ANGIOGRAPHY;  Surgeon: Mady Bruckner, MD;  Location: [facility];  Service: Cardiovascular;  Laterality: N/A;   POLYPECTOMY     TEE WITHOUT CARDIOVERSION N/A 10/04/2015   Procedure: TRANSESOPHAGEAL ECHOCARDIOGRAM (TEE);  Surgeon: Wilbert JONELLE Bihari, MD;  Location: [facility];  Service: Cardiovascular;  Laterality: N/A;   TUBAL LIGATION  1978   Family History  Problem Relation Age of Onset   Hypertension Mother    Heart disease Mother        has a pacemaker    Alzheimer's disease Mother    Hyperlipidemia Mother    Osteoporosis Mother    Heart failure Father  Hypertension Father    Alzheimer's disease Father    Hypertension Sister    Breast cancer Sister    Hypertension Sister    Gout Sister    Heart disease Son    Alcohol abuse Son    Pancreatitis Son    Hypertension Son    Cancer Neg Hx    Colon cancer Neg Hx    Esophageal cancer Neg Hx    Stomach cancer Neg Hx    Rectal cancer Neg Hx    Social History   Socioeconomic History   Marital status: Single    Spouse name: Not on file   Number of children: 2   Years of education: 12   Highest education level: 12th grade  Occupational History   Occupation: Custodian    Employer: [employer]    Comment: RETIRED  Tobacco Use   Smoking status: Former    Current packs/day: 0.00    Types: Cigarettes    Quit date: 07/29/2010    Years since quitting: 13.1    Passive exposure: Past   Smokeless tobacco: Never  Vaping Use   Vaping status: Never Used  Substance and Sexual Activity   Alcohol use: Not Currently   Drug use: Never   Sexual activity: Not Currently    Birth control/protection:  Surgical    Comment: 1st intercourse 73 yo-Fewer than 5 partners  Other Topics Concern   Not on file  Social History Narrative   Patient and her one of her sons live together in [place].   Patient has another son who resides in [place], [place].   Never married      Patient enjoys spending time at a senior recreational center. Senior activities and dinners.       Patient walks ~15 minutes per day.   Social Drivers of Corporate investment banker Strain: Low Risk  (09/20/2023)   Overall Financial Resource Strain (CARDIA)    Difficulty of Paying Living Expenses: Not hard at all  Food Insecurity: No Food Insecurity (09/20/2023)   Hunger Vital Sign    Worried About Running Out of Food in the Last Year: Never true    Ran Out of Food in the Last Year: Never true  Transportation Needs: No Transportation Needs (09/20/2023)   PRAPARE - Administrator, Civil Service (Medical): No    Lack of Transportation (Non-Medical): No  Physical Activity: Sufficiently Active (09/20/2023)   Exercise Vital Sign    Days of Exercise per Week: 5 days    Minutes of Exercise per Session: 30 min  Stress: No Stress Concern Present (09/20/2023)   [employer] of Occupational Health - Occupational Stress Questionnaire    Feeling of Stress: Not at all  Social Connections: Moderately Integrated (09/20/2023)   Social Connection and Isolation Panel    Frequency of Communication with Friends and Family: More than three times a week    Frequency of Social Gatherings with Friends and Family: Twice a week    Attends Religious Services: More than 4 times per year    Active Member of [employer] or Organizations: Yes    Attends Engineer, structural: More than 4 times per year    Marital Status: Never married    Tobacco Counseling Counseling given: Not Answered    Clinical Intake:  Pre-visit preparation completed: Yes  Pain : 0-10 Pain Score: 10-Worst pain ever (EVERYDAY WITH PAIN) Pain Type:  Chronic pain Pain Location: Generalized     BMI - recorded:  32.78 Nutritional Status: BMI > 30  Obese Nutritional Risks: None Diabetes: No  Lab Results  Component Value Date   HGBA1C 5.9 01/31/2017   HGBA1C 5.7 04/17/2016   HGBA1C 5.4 09/30/2015     How often do you need to have someone help you when you read instructions, pamphlets, or other written materials from your doctor or pharmacy?: 1 - Never  Interpreter Needed?: No  Information entered by :: Denise Fuller, LPN.   Activities of Daily Living     09/20/2023    4:16 PM  In your present state of health, do you have any difficulty performing the following activities:  Hearing? 0  Vision? 0  Difficulty concentrating or making decisions? 0  Walking or climbing stairs? 0  Dressing or bathing? 0  Doing errands, shopping? 0  Preparing Food and eating ? N  Using the Toilet? N  In the past six months, have you accidently leaked urine? N  Do you have problems with loss of bowel control? N  Managing your Medications? N  Managing your Finances? N  Housekeeping or managing your Housekeeping? N    Patient Care Team: Rosendo Rush, MD as PCP - General (Family Medicine) Kate Lonni CROME, MD as PCP - Cardiology (Cardiology) Burnard Debby LABOR, MD as PCP - Sleep Medicine (Cardiology) Duwayne Purchase, MD as Consulting Physician (Orthopedic Surgery) [facility] as Consulting Physician (Optometry)  I have updated your Care Teams any recent Medical Services you may have received from other providers in the past year.     Assessment:   This is a routine wellness examination for Adysson.  Hearing/Vision screen Hearing Screening - Comments:: Denies hearing difficulties.  Vision Screening - Comments:: Wears rx glasses - up to date with routine eye exams with [facility]     Goals Addressed             This Visit's Progress    09/20/2023: Continue to exercise, eat healthy  and stay independent.         Depression Screen     09/20/2023    4:17 PM 09/06/2023    3:20 PM 03/29/2023    2:32 PM 12/25/2022    3:15 PM 09/14/2022   11:20 AM 09/05/2022    1:15 PM 08/15/2022    1:34 PM  PHQ 2/9 Scores  PHQ - 2 Score 0 0 0 0 0 0 0  PHQ- 9 Score 0 0 0 0 0 0 0    Fall Risk     09/20/2023    4:15 PM 09/06/2023    3:21 PM 06/12/2023    1:35 PM 03/29/2023    2:32 PM 01/05/2023    2:18 PM  Fall Risk   Falls in the past year? 0 0 0 0 0  Number falls in past yr: 0 0 0 0 0  Injury with Fall? 0 0 0 0   Risk for fall due to : No Fall Risks      Follow up Falls evaluation completed Falls evaluation completed       MEDICARE RISK AT HOME:  Medicare Risk at Home Any stairs in or around the home?: Yes If so, are there any without handrails?: No Home free of loose throw rugs in walkways, pet beds, electrical cords, etc?: Yes Adequate lighting in your home to reduce risk of falls?: Yes Life alert?: No Use of a cane, walker or w/c?: Yes Grab bars in the bathroom?: Yes Shower chair or bench in  shower?: Yes Elevated toilet seat or a handicapped toilet?: No  TIMED UP AND GO:  Was the test performed?  No  Cognitive Function: 6CIT completed    09/20/2023    4:17 PM  MMSE - Mini Mental State Exam  Not completed: Unable to complete        09/20/2023    4:15 PM 09/05/2022    1:12 PM 11/01/2021    2:31 PM  6CIT Screen  What Year? 0 points 0 points 0 points  What month? 0 points 0 points 0 points  What time? 0 points 0 points 0 points  Count back from 20 0 points 0 points 0 points  Months in reverse 0 points 0 points 0 points  Repeat phrase 0 points 0 points 0 points  Total Score 0 points 0 points 0 points    Immunizations Immunization History  Administered Date(s) Administered   Fluad Quad(high Dose 65+) 05/04/2021   Fluad Trivalent(High Dose 65+) 01/05/2023   Influenza,inj,Quad PF,6+ Mos 03/09/2015   Influenza-Unspecified 02/08/2014   PFIZER Comirnaty(Gray  Top)Covid-19 Tri-Sucrose Vaccine 05/03/2019, 01/15/2020   PFIZER(Purple Top)SARS-COV-2 Vaccination 05/24/2019   PNEUMOCOCCAL CONJUGATE-20 12/06/2021   Pfizer Covid-19 Vaccine Bivalent Booster 64yrs & up 05/04/2021   Pfizer(Comirnaty)Fall Seasonal Vaccine 12 years and older 05/05/2022, 01/05/2023   Pneumococcal Conjugate-13 03/12/2017   Respiratory Syncytial Virus Vaccine,Recomb Aduvanted(Arexvy) 12/22/2021   Tdap 09/12/2015    Screening Tests Health Maintenance  Topic Date Due   Zoster Vaccines- Shingrix (1 of 2) Never done   COVID-19 Vaccine (7 - Pfizer risk 2024-25 season) 07/06/2023   INFLUENZA VACCINE  10/26/2023   MAMMOGRAM  04/08/2024   Medicare Annual Wellness (AWV)  09/19/2024   DTaP/Tdap/Td (2 - Td or Tdap) 09/11/2025   Colonoscopy  12/29/2025   Pneumococcal Vaccine: 50+ Years  Completed   DEXA SCAN  Completed   Hepatitis C Screening  Completed   Hepatitis B Vaccines  Aged Out   HPV VACCINES  Aged Out   Meningococcal B Vaccine  Aged Out    Health Maintenance  Health Maintenance Due  Topic Date Due   Zoster Vaccines- Shingrix (1 of 2) Never done   COVID-19 Vaccine (7 - Pfizer risk 2024-25 season) 07/06/2023   Health Maintenance Items Addressed: Patient aware of current care gaps.  Immunization record was verified by [facility] and updated in patient's chart.   Additional Screening:  Vision Screening: Recommended annual ophthalmology exams for early detection of glaucoma and other disorders of the eye. Would you like a referral to an eye doctor? No    Dental Screening: Recommended annual dental exams for proper oral hygiene  Community Resource Referral / Chronic Care Management: CRR required this visit?  No   CCM required this visit?  No   Plan:    I have personally reviewed and noted the following in the patient's chart:   Medical and social history Use of alcohol, tobacco or illicit drugs  Current medications and supplements including opioid prescriptions.  Patient is not currently taking opioid prescriptions. Functional ability and status Nutritional status Physical activity Advanced directives List of other physicians Hospitalizations, surgeries, and ER visits in previous 12 months Vitals Screenings to include cognitive, depression, and falls Referrals and appointments  In addition, I have reviewed and discussed with patient certain preventive protocols, quality metrics, and best practice recommendations. A written personalized care plan for preventive services as well as general preventive health recommendations were provided to patient.   [name], [place]   [id number]  After Visit Summary: (MyChart) Due to this being a telephonic visit, the after visit summary with patients personalized plan was offered to patient via MyChart   Notes: Patient aware of current care gaps.  Immunization record was verified by [facility] and updated in patient's chart. Please see additional notes in routine comments.

## 2023-09-24 ENCOUNTER — Other Ambulatory Visit: Payer: Self-pay | Admitting: Student

## 2023-09-24 ENCOUNTER — Other Ambulatory Visit: Payer: Self-pay | Admitting: Cardiology

## 2023-09-24 DIAGNOSIS — I10 Essential (primary) hypertension: Secondary | ICD-10-CM

## 2023-09-25 ENCOUNTER — Other Ambulatory Visit: Payer: Self-pay

## 2023-09-25 ENCOUNTER — Emergency Department (HOSPITAL_COMMUNITY)

## 2023-09-25 ENCOUNTER — Emergency Department (HOSPITAL_COMMUNITY)
Admission: EM | Admit: 2023-09-25 | Discharge: 2023-09-25 | Disposition: A | Discharge status: Home / Self Care | Attending: Emergency Medicine | Admitting: Emergency Medicine

## 2023-09-25 ENCOUNTER — Encounter (HOSPITAL_COMMUNITY): Payer: Self-pay | Admitting: Emergency Medicine

## 2023-09-25 ENCOUNTER — Ambulatory Visit: Admitting: Primary Care

## 2023-09-25 DIAGNOSIS — I10 Essential (primary) hypertension: Secondary | ICD-10-CM | POA: Diagnosis not present

## 2023-09-25 DIAGNOSIS — Z79899 Other long term (current) drug therapy: Secondary | ICD-10-CM | POA: Insufficient documentation

## 2023-09-25 DIAGNOSIS — M47816 Spondylosis without myelopathy or radiculopathy, lumbar region: Secondary | ICD-10-CM | POA: Diagnosis not present

## 2023-09-25 DIAGNOSIS — M25522 Pain in left elbow: Secondary | ICD-10-CM | POA: Diagnosis not present

## 2023-09-25 DIAGNOSIS — M25521 Pain in right elbow: Secondary | ICD-10-CM | POA: Diagnosis not present

## 2023-09-25 DIAGNOSIS — M545 Low back pain, unspecified: Secondary | ICD-10-CM | POA: Insufficient documentation

## 2023-09-25 DIAGNOSIS — Z91040 Latex allergy status: Secondary | ICD-10-CM | POA: Diagnosis not present

## 2023-09-25 DIAGNOSIS — M160 Bilateral primary osteoarthritis of hip: Secondary | ICD-10-CM | POA: Diagnosis not present

## 2023-09-25 DIAGNOSIS — Z7982 Long term (current) use of aspirin: Secondary | ICD-10-CM | POA: Insufficient documentation

## 2023-09-25 DIAGNOSIS — M7989 Other specified soft tissue disorders: Secondary | ICD-10-CM | POA: Diagnosis not present

## 2023-09-25 DIAGNOSIS — M25552 Pain in left hip: Secondary | ICD-10-CM | POA: Diagnosis not present

## 2023-09-25 LAB — BASIC METABOLIC PANEL WITH GFR
Anion gap: 11 (ref 5–15)
BUN: 18 mg/dL (ref 8–23)
CO2: 25 mmol/L (ref 22–32)
Calcium: 9 mg/dL (ref 8.9–10.3)
Chloride: 104 mmol/L (ref 98–111)
Creatinine, Ser: 1.29 mg/dL — ABNORMAL HIGH (ref 0.44–1.00)
GFR, Estimated: 44 mL/min — ABNORMAL LOW (ref >60)
Glucose, Bld: 119 mg/dL — ABNORMAL HIGH (ref 70–99)
Potassium: 3.5 mmol/L (ref 3.5–5.1)
Sodium: 140 mmol/L (ref 135–145)

## 2023-09-25 LAB — CBC
HCT: 39.8 % (ref 36.0–46.0)
Hemoglobin: 12.7 g/dL (ref 12.0–15.0)
MCH: 31.8 pg (ref 26.0–34.0)
MCHC: 31.9 g/dL (ref 30.0–36.0)
MCV: 99.7 fL (ref 80.0–100.0)
Platelets: 317 10*3/uL (ref 150–400)
RBC: 3.99 MIL/uL (ref 3.87–5.11)
RDW: 13.5 % (ref 11.5–15.5)
WBC: 15.7 10*3/uL — ABNORMAL HIGH (ref 4.0–10.5)
nRBC: 0 % (ref 0.0–0.2)

## 2023-09-25 LAB — URINALYSIS, ROUTINE W REFLEX MICROSCOPIC
Bilirubin Urine: NEGATIVE
Glucose, UA: NEGATIVE mg/dL
Hgb urine dipstick: NEGATIVE
Ketones, ur: NEGATIVE mg/dL
Nitrite: NEGATIVE
Protein, ur: NEGATIVE mg/dL
Specific Gravity, Urine: 1.016 (ref 1.005–1.030)
pH: 5 (ref 5.0–8.0)

## 2023-09-25 LAB — WET PREP, GENITAL
Clue Cells Wet Prep HPF POC: NONE SEEN
Sperm: NONE SEEN
Trich, Wet Prep: NONE SEEN
WBC, Wet Prep HPF POC: <10 (ref <10)
Yeast Wet Prep HPF POC: NONE SEEN

## 2023-09-25 MED ORDER — KETOROLAC TROMETHAMINE 60 MG/2ML IM SOLN
30.0000 mg | Freq: Once | INTRAMUSCULAR | Status: AC
  Administered 2023-09-25: 30 mg via INTRAMUSCULAR
  Filled 2023-09-25: qty 2

## 2023-09-25 NOTE — Discharge Instructions (Addendum)
 Your test results today are reassuring.  I suspect that your elbow and hip pain are from arthritis.  In general, movement is good.  Listen to your body and avoid activities that worsen your pain.  Continue Tylenol  as needed for pain.  Occasional ibuprofen  would be okay as well.  Avoid long-term use of ibuprofen , as this can cause stomach and kidney damage.  If you would like to establish care with a gynecologist for management of your chronic vaginal irritation, a telephone number is below.  Return to the emergency department for any new or worsening symptoms of concern.

## 2023-09-25 NOTE — ED Provider Notes (Signed)
 [place] EMERGENCY DEPARTMENT AT [facility] Provider Note   CSN: [id number] Arrival date & time: 09/25/23  9856     Patient presents with: Elbow Pain and Flank Pain   [name] is a 73 y.o. female.    Flank Pain  Patient presents for multiple complaints.  Medical history includes HTN, HLD, arthritis.  She was seen by PCP 3 weeks ago.  At the time, she endorsed chronic low back pain and left hip pain.  She was prescribed Tylenol , lidocaine  patch, and Aspercreme.  She had right elbow pain at the time and recommendations were for pain control, compression sleeve, and orthopedic follow-up.  She has had ongoing pain in bilateral elbows and left hip.  She states that the pain is worse in the morning and improves with ambulation and movement.  She also has had ongoing vaginal irritation over the past several months.  She says that she was given an antibiotic which did temporarily relieve the symptoms.  She denies any vaginal discharge.  She is not sexually active.  Patient states that her son was at the hospital and she came by the ER to get checked out.     Prior to Admission medications   Medication Sig Start Date End Date Taking? Authorizing Provider  Alirocumab  (PRALUENT ) 75 MG/ML SOAJ Inject 1 mL (75 mg total) into the skin every 14 (fourteen) days. 09/13/23   Kate Lonni CROME, MD  amLODipine  (NORVASC ) 5 MG tablet TAKE 1 TABLET(5 MG) BY MOUTH DAILY 08/08/23   Rosendo Rush, MD  aspirin  EC 81 MG tablet Take 1 tablet (81 mg total) daily by mouth. 01/31/17   Kristy Sharolyn Lenis, PA-C  atorvastatin  (LIPITOR) 80 MG tablet TAKE 1 TABLET(80 MG) BY MOUTH DAILY AT 6 PM 09/04/22   Kate Lonni CROME, MD  azelastine  (ASTELIN ) 0.1 % nasal spray Place 1 spray into both nostrils 2 (two) times daily. Use in each nostril as directed 06/18/23   Reddick, Johnathan B, NP  azithromycin  (ZITHROMAX ) 250 MG tablet Take first 2 tablets together, then 1 every day until finished. 08/14/23    White, Elizabeth A, PA-C  benzonatate  (TESSALON ) 100 MG capsule Take 1 capsule (100 mg total) by mouth every 8 (eight) hours. 08/14/23   White, Elizabeth A, PA-C  carvedilol  (COREG ) 12.5 MG tablet TAKE 1 TABLET(12.5 MG) BY MOUTH TWICE DAILY WITH A MEAL 08/14/23   Rosendo Rush, MD  ezetimibe  (ZETIA ) 10 MG tablet Take 1 tablet (10 mg total) by mouth daily. 01/29/23 09/20/23  Kate Lonni CROME, MD  fluticasone  (FLONASE ) 50 MCG/ACT nasal spray SHAKE LIQUID AND USE 2 SPRAYS IN EACH NOSTRIL TWICE DAILY 07/07/22   Rosendo Rush, MD  furosemide  (LASIX ) 40 MG tablet TAKE 1 TABLET(40 MG) BY MOUTH DAILY 09/24/23   Rosendo Rush, MD  gabapentin  (NEURONTIN ) 300 MG capsule Take 1 capsule (300 mg total) by mouth 2 (two) times daily. 03/24/23   Patt Lenis Macho, MD  hydrochlorothiazide  (HYDRODIURIL ) 25 MG tablet Take 1 tablet (25 mg total) by mouth daily. 03/24/23   Patt Lenis Macho, MD  isosorbide  mononitrate (IMDUR ) 60 MG 24 hr tablet TAKE 1 TABLET(60 MG) BY MOUTH DAILY 09/25/23   Kate Lonni CROME, MD  ketoconazole (NIZORAL) 2 % cream Apply 1 Application topically as needed. 11/13/22   [provider]  ketoconazole (NIZORAL) 2 % shampoo Apply 1 Application topically as needed. 11/13/22   [provider]  losartan  (COZAAR ) 100 MG tablet TAKE 1 TABLET(100 MG) BY MOUTH DAILY 08/21/23  Rosendo Rush, MD  Magnesium  Oxide, Elemental, 400 MG TABS Take 400 mg by mouth 2 (two) times daily. 07/26/23   Kate Lonni CROME, MD  Multiple Vitamin (MULITIVITAMIN WITH MINERALS) TABS Take 1 tablet by mouth daily.    [provider]  nitroGLYCERIN  (NITROSTAT ) 0.4 MG SL tablet DISSOLVE ONE TABLET UNDER TONGUE AS NEEDED FOR CHEST PAIN EVERY 5 MINUTES 08/08/23   Kate Lonni CROME, MD  potassium chloride  SA (KLOR-CON  M) 20 MEQ tablet TAKE 1 TABLET(20 MEQ) BY MOUTH TWICE DAILY 07/10/23   Rosendo Rush, MD    Allergies: Shrimp [shellfish allergy], Latex, and Penicillins    Review of Systems   Musculoskeletal:  Positive for arthralgias.  All other systems reviewed and are negative.   Updated Vital Signs BP (!) 161/69   Pulse 60   Temp 98.3 F (36.8 C) (Oral)   Resp 17   Wt 90 kg   SpO2 100%   BMI 33.02 kg/m   Physical Exam Vitals and nursing note reviewed.  Constitutional:      General: She is not in acute distress.    Appearance: Normal appearance. She is well-developed. She is not ill-appearing, toxic-appearing or diaphoretic.  HENT:     Head: Normocephalic and atraumatic.     Right Ear: External ear normal.     Left Ear: External ear normal.     Nose: Nose normal.     Mouth/Throat:     Mouth: Mucous membranes are moist.   Eyes:     Extraocular Movements: Extraocular movements intact.     Conjunctiva/sclera: Conjunctivae normal.    Cardiovascular:     Rate and Rhythm: Normal rate and regular rhythm.  Pulmonary:     Effort: Pulmonary effort is normal. No respiratory distress.  Abdominal:     General: There is no distension.     Palpations: Abdomen is soft.     Tenderness: There is no abdominal tenderness.   Musculoskeletal:        General: No swelling, deformity or signs of injury.     Cervical back: Normal range of motion and neck supple.   Skin:    General: Skin is warm and dry.     Coloration: Skin is not jaundiced or pale.   Neurological:     General: No focal deficit present.     Mental Status: She is alert and oriented to person, place, and time.   Psychiatric:        Mood and Affect: Mood normal.        Behavior: Behavior normal.     (all labs ordered are listed, but only abnormal results are displayed) Labs Reviewed  URINALYSIS, ROUTINE W REFLEX MICROSCOPIC - Abnormal; Notable for the following components:      Result Value   APPearance HAZY (*)    Leukocytes,Ua TRACE (*)    Bacteria, UA RARE (*)    All other components within normal limits  BASIC METABOLIC PANEL WITH GFR - Abnormal; Notable for the following components:    Glucose, Bld 119 (*)    Creatinine, Ser 1.29 (*)    GFR, Estimated 44 (*)    All other components within normal limits  CBC - Abnormal; Notable for the following components:   WBC 15.7 (*)    All other components within normal limits  WET PREP, GENITAL    EKG: None  Radiology: DG Hip Unilat W or Wo Pelvis 2-3 Views Left Result Date: 09/25/2023 CLINICAL DATA:  Left hip pain. EXAM: DG HIP (  WITH OR WITHOUT PELVIS) 2-3V LEFT COMPARISON:  CT abdomen/pelvis dated 08/28/2022. FINDINGS: No acute fracture or dislocation. Femoral heads are seated within the acetabula. Mild joint space narrowing of the bilateral hips. Sacroiliac joints and pubic symphysis are anatomically aligned. Redemonstrated levocurvature with degenerative changes of the visualized lower lumbar spine. Calcified uterine fibroids project over the central pelvis. IMPRESSION: 1. No acute osseous abnormality. 2. Mild osteoarthritis of the bilateral hips. Electronically Signed   By: Harrietta Sherry M.D.   On: 09/25/2023 11:06   DG Elbow Complete Right Result Date: 09/25/2023 CLINICAL DATA:  Right elbow pain. EXAM: RIGHT ELBOW - COMPLETE 3+ VIEW COMPARISON:  None Available. FINDINGS: There is no evidence of fracture, dislocation, or joint effusion. There is no evidence of significant arthropathy or other focal bone abnormality. Mild soft tissue swelling is seen along the posterior aspect of the right olecranon process. IMPRESSION: Mild posterior soft tissue swelling without evidence of an acute osseous abnormality. Electronically Signed   By: Suzen Dials M.D.   On: 09/25/2023 02:10     Procedures   Medications Ordered in the ED  ketorolac  (TORADOL ) injection 30 mg (30 mg Intramuscular Given 09/25/23 9076)                                    Medical Decision Making Amount and/or Complexity of Data Reviewed Labs: ordered. Radiology: ordered.  Risk Prescription drug management.   Patient presenting for multiple complaints.   She has had ongoing pain in bilateral elbows as well as left hip.  She was seen by PCP about this.  She was prescribed Tylenol  and Aspercreme.  On exam, she does not have any swelling or skin changes.  Range of motion is fully intact.  Exam findings not consistent with bursitis.  I suspect arthritis.  Dose of Toradol  was ordered.  Although triage noted flank pain, patient's pain is more in her left hip.  Again, range of motion is intact.  She has no areas of numbness or weakness.  This pain improves with ambulation.  Patient also describes a vaginal irritation that has been ongoing for the past several months.  She states that it improved shortly following course of antibiotics.  She states that the antibiotic started with an F.  I suspect she was treated for BV.  Will check wet prep today.  Alternatively, patient may have ongoing vaginal atrophy and may benefit from PCP/gynecology follow-up for consideration of estrogen cream.  Patient declines pelvic exam and elects to self swab.  Wet prep was negative.  X-ray of left hip and right elbow did not show acute findings.  Patient was given Toradol  for analgesia.  She was discharged in stable condition.     Final diagnoses:  Pain of both elbows  Left hip pain    ED Discharge Orders     None          Melvenia Motto, MD 09/25/23 [phone]

## 2023-09-25 NOTE — ED Triage Notes (Signed)
 Patient c/o right elbow pain and left flank pain x 2 weeks.  Patient also endorses vaginal itching x 6 months.  Patient has been seen by PCP for all complaints.  Patient gives verbal consent for MSE.

## 2023-09-26 NOTE — Telephone Encounter (Signed)
 Called patient to FU on concern. Patient had LVM on nurse line.  Patient did not answer. Will await her return call or mychart message.

## 2023-10-17 ENCOUNTER — Telehealth: Payer: Self-pay

## 2023-10-17 NOTE — Telephone Encounter (Signed)
 Pt calling referral line asking for a Rx or an order for a shower chair.  Please give her a call at [phone] and let her know when this has been done.  Margit Dimes, CMA

## 2023-10-18 ENCOUNTER — Other Ambulatory Visit: Payer: Self-pay | Admitting: Student

## 2023-10-18 DIAGNOSIS — M545 Low back pain, unspecified: Secondary | ICD-10-CM

## 2023-10-18 NOTE — Progress Notes (Signed)
 Placed DME order for shower stool at patient reque\st. Request deemed appropriate given patient history of moderate-sever chronic back pain.

## 2023-10-25 ENCOUNTER — Telehealth: Admitting: Student

## 2023-10-25 ENCOUNTER — Ambulatory Visit (INDEPENDENT_AMBULATORY_CARE_PROVIDER_SITE_OTHER): Admitting: Student

## 2023-10-25 ENCOUNTER — Telehealth: Payer: Self-pay

## 2023-10-25 ENCOUNTER — Telehealth: Payer: Self-pay | Admitting: Student

## 2023-10-25 ENCOUNTER — Encounter: Payer: Self-pay | Admitting: Student

## 2023-10-25 DIAGNOSIS — G8929 Other chronic pain: Secondary | ICD-10-CM

## 2023-10-25 DIAGNOSIS — M545 Low back pain, unspecified: Secondary | ICD-10-CM | POA: Diagnosis not present

## 2023-10-25 NOTE — Telephone Encounter (Signed)
 Called patient to follow up about her virtual visit appointment, however unable to reach patient and lefty a generic VM. This visit was for DME order to get shower stool given patient history of chronic back pain that made ambulation difficult. Face to face needed for insurance.

## 2023-10-25 NOTE — Progress Notes (Signed)
 Virtual Visit via Telephone Note  I connected with [name] on 10/25/23 at  4:10 PM EDT by telephone and verified that I am speaking with the correct person using two identifiers.  Location: Patient: [name] Provider: Norleen April, MD.   I discussed the limitations, risks, security and privacy concerns of performing an evaluation and management service by telephone and the availability of in person appointments. I also discussed with the patient that there may be a patient responsible charge related to this service. The patient expressed understanding and agreed to proceed.   History of Present Illness: 73 year old female with history of chronic lower back pain had a virtual phone visit today requesting a shower chair.  Patient reports due to her chronic lower back pain she has been unable to stand for a period of time and also has had limitation with ambulation due to her back pain.  Given difficulty with her lower back pain and standing for long period of time this has affected her when showering.   Of note patient follows with orthopedic specialist, has not had any recent visit due to issues making her appointment given difficulty with ambulation.   Observations/Objective: Doing well, not in active distress, pleasant and good spirt   Assessment and Plan: Given patient's history of chronic low back pain affecting ambulation and prolong standing, agree that a shower chair to assist patient is appropriate. DME order for sower stool placed. Patient encouraged to follow up with her orthopedic doctor.   Follow Up Instructions:    I discussed the assessment and treatment plan with the patient. The patient was provided an opportunity to ask questions and all were answered and for safety reasons. The patient agreed with the plan and demonstrated an understanding of the instructions.   The patient was advised to call back or seek an in-person evaluation if the symptoms worsen or if  the condition fails to improve as anticipated.  I provided 10 minutes of non-face-to-face time during this encounter.   Norleen April, MD

## 2023-10-25 NOTE — Telephone Encounter (Signed)
 Community message sent to [employer] for shower chair.   Receipt confirmed by [employer].   Chiquita JAYSON English, RN

## 2023-11-01 ENCOUNTER — Other Ambulatory Visit: Payer: Self-pay | Admitting: Student

## 2023-11-01 DIAGNOSIS — I10 Essential (primary) hypertension: Secondary | ICD-10-CM

## 2023-11-16 ENCOUNTER — Other Ambulatory Visit: Payer: Self-pay

## 2023-11-19 MED ORDER — CARVEDILOL 12.5 MG PO TABS: ORAL_TABLET | ORAL | 2 refills | Status: AC

## 2023-11-20 ENCOUNTER — Other Ambulatory Visit: Payer: Self-pay | Admitting: Cardiology

## 2023-11-20 DIAGNOSIS — E785 Hyperlipidemia, unspecified: Secondary | ICD-10-CM

## 2023-11-22 DIAGNOSIS — M25561 Pain in right knee: Secondary | ICD-10-CM | POA: Diagnosis not present

## 2023-11-22 DIAGNOSIS — G8929 Other chronic pain: Secondary | ICD-10-CM | POA: Diagnosis not present

## 2023-11-22 DIAGNOSIS — M778 Other enthesopathies, not elsewhere classified: Secondary | ICD-10-CM | POA: Diagnosis not present

## 2023-11-22 DIAGNOSIS — M170 Bilateral primary osteoarthritis of knee: Secondary | ICD-10-CM | POA: Diagnosis not present

## 2023-11-22 DIAGNOSIS — M25562 Pain in left knee: Secondary | ICD-10-CM | POA: Diagnosis not present

## 2023-12-04 ENCOUNTER — Ambulatory Visit: Admitting: Primary Care

## 2023-12-13 DIAGNOSIS — M25561 Pain in right knee: Secondary | ICD-10-CM | POA: Diagnosis not present

## 2023-12-13 DIAGNOSIS — M170 Bilateral primary osteoarthritis of knee: Secondary | ICD-10-CM | POA: Diagnosis not present

## 2023-12-13 DIAGNOSIS — M25562 Pain in left knee: Secondary | ICD-10-CM | POA: Diagnosis not present

## 2023-12-13 DIAGNOSIS — G8929 Other chronic pain: Secondary | ICD-10-CM | POA: Diagnosis not present

## 2024-01-07 ENCOUNTER — Ambulatory Visit: Admitting: Family Medicine

## 2024-01-07 ENCOUNTER — Encounter: Payer: Self-pay | Admitting: Family Medicine

## 2024-01-07 VITALS — BP 159/72 | Ht 65.0 in

## 2024-01-07 DIAGNOSIS — Z23 Encounter for immunization: Secondary | ICD-10-CM

## 2024-01-07 DIAGNOSIS — R350 Frequency of micturition: Secondary | ICD-10-CM | POA: Diagnosis not present

## 2024-01-07 DIAGNOSIS — J302 Other seasonal allergic rhinitis: Secondary | ICD-10-CM | POA: Diagnosis not present

## 2024-01-07 DIAGNOSIS — J069 Acute upper respiratory infection, unspecified: Secondary | ICD-10-CM | POA: Diagnosis not present

## 2024-01-07 DIAGNOSIS — I5030 Unspecified diastolic (congestive) heart failure: Secondary | ICD-10-CM | POA: Insufficient documentation

## 2024-01-07 LAB — POC SOFIA 2 FLU + SARS ANTIGEN FIA
Influenza A, POC: NEGATIVE
Influenza B, POC: NEGATIVE
SARS Coronavirus 2 Ag: NEGATIVE

## 2024-01-07 MED ORDER — FLUTICASONE PROPIONATE 50 MCG/ACT NA SUSP: NASAL | 1 refills | Status: DC

## 2024-01-07 NOTE — Patient Instructions (Signed)
 It was great to see you today! Thank you for choosing [facility] for your primary care.  Today we addressed: 1. Viral infection You likely have a virus.  Please make sure to keep drinking lots of fluids.  2. Urinary frequency We are checking your urine today for infection.  You will get a MyChart message or a letter if results are normal. Otherwise, you will get a call from us .  If you had a referral placed, they will call you to set up an appointment. Please give us  a call if you don't hear back in the next 2 weeks.  You should return to our clinic .  Thank you for coming to see us  at [facility] and for the opportunity to care for you! Toma, Jahmani Staup, MD 01/07/2024, 3:24 PM

## 2024-01-07 NOTE — Assessment & Plan Note (Signed)
Refilled fluticasone.  

## 2024-01-07 NOTE — Progress Notes (Signed)
   SUBJECTIVE:   CHIEF COMPLAINT / HPI:  [name] is a 73 y.o. female with a pertinent past medical history of HTN, HLD, HFpEF, and history of aortic valve vegetation presenting to the clinic for acute onset of bodyaches and chills.  Body aches, chills Began:  3 days ago Degree:  No fevers Treatments:  None Associated Symptoms:  Runny nose, chills over the weekend, myalgias Exposure to illnesses: No known Abnormal Urine:  Endorses frequency Skin rash:  None Oral Intake:  Normal, working on getting extra water  Follows closely with ortho, will discuss knee surgery.  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   OBJECTIVE:   BP (!) 159/72   Ht 5' 5 (1.651 m)   BMI 33.02 kg/m   General: Age-appropriate, resting comfortably in chair, NAD, alert and at baseline. HEENT: Moderate rhinorrhea.  MMM. Cardiovascular: Regular rate and rhythm. Normal S1/S2. No murmurs, rubs, or gallops appreciated. Pulmonary: Clear bilaterally to ascultation. No wheezes, crackles, or rhonchi. Normal WOB on room air. No accessory muscle use. Abdominal: No CVA tenderness.  No tenderness to deep or light palpation. No rebound or guarding. No HSM. Skin: Warm and dry. Extremities: Capillary refill <2 seconds. MSK: No spinal or paraspinal tenderness to palpation.   ASSESSMENT/PLAN:   Assessment & Plan Urinary frequency Patient does take Lasix , so difficult to evaluate if true urinary frequency.  Reasonable to test.  No CVA tenderness, no concern for pyelonephritis.  Well-appearing overall. - UA inaccurate given that patient took AZO - Follow urine culture Viral URI COVID and influenza negative.  Favor with myalgias in setting of viral illness. - Recommend  supportive care, adequate hydration - Return precautions provided Seasonal allergies - Refilled fluticasone  Encounter for immunization Influenza and COVID administered.  Denise Goede Toma, MD [facility]

## 2024-01-07 NOTE — Assessment & Plan Note (Addendum)
 Patient does take Lasix , so difficult to evaluate if true urinary frequency.  Reasonable to test.  No CVA tenderness, no concern for pyelonephritis.  Well-appearing overall. - UA inaccurate given that patient took AZO - Follow urine culture

## 2024-01-09 ENCOUNTER — Ambulatory Visit: Payer: Self-pay | Admitting: Family Medicine

## 2024-01-09 LAB — URINE CULTURE

## 2024-01-15 ENCOUNTER — Ambulatory Visit (INDEPENDENT_AMBULATORY_CARE_PROVIDER_SITE_OTHER): Admitting: Primary Care

## 2024-01-15 ENCOUNTER — Encounter: Payer: Self-pay | Admitting: Primary Care

## 2024-01-15 VITALS — BP 164/71 | HR 64 | Temp 97.7°F | Ht 65.0 in | Wt 204.4 lb

## 2024-01-15 DIAGNOSIS — G4733 Obstructive sleep apnea (adult) (pediatric): Secondary | ICD-10-CM | POA: Diagnosis not present

## 2024-01-15 NOTE — Patient Instructions (Addendum)
  Need repeat sleep study in lab  Follow-up 3 months with [facility]

## 2024-01-15 NOTE — Progress Notes (Signed)
 @Patient  ID: [name], female    DOB: [birth year], 73 y.o.   MRN: [id number]  Chief Complaint  Patient presents with   Consult    [facility]- DX x2022. No longer has a machine as of 2022. Pt states she does not have issues with her sleeping. Pt states the only reason she gets up through out the night is due to her fluid pill. Pt will complain of daytime sleepines    Referring provider: Kate Lonni CROME*  HPI: 73 year old female, former smoker quit in 2012. PMH significant for heart failure, aortic atherosclerosis, HTN, osteoarthritis, HLD, daytime somnolence, seasonal allergies.   01/15/2024 Discussed the use of AI scribe software for clinical note transcription with the patient, who gave verbal consent to proceed.  History of Present Illness [name] is a 73 year old female with moderate sleep apnea who presents for evaluation of sleep apnea management. She was referred by her cardiologist for evaluation of sleep apnea management.  She has a history of moderate sleep apnea diagnosed in August 2022, with a split night sleep study showing an average of 21 apneic events per hour. She was previously prescribed a CPAP machine but discontinued its use after about a month due to issues with the machine's readings and frequent nocturnal awakenings to urinate, possibly related to diuretic use. The CPAP machine was subsequently returned.  She experiences daytime sleepiness, which has improved over time, and denies current daytime somnolence. She is unsure if she snores, as no one has mentioned it to her, and she has not experienced waking up choking or gasping. Her sleep pattern includes going to bed between 9 and 10 PM, taking about 30 minutes to fall asleep, waking up 2-3 times per night, and starting her day around 9 AM. No snoring, choking, or gasping during sleep and she reports sleeping well without tossing and turning.  Her weight has increased from 190 pounds during her last sleep  study in 2022 to 204 pounds currently. She acknowledges this weight gain and has been attempting to change her eating habits.   Allergies  Allergen Reactions   Shrimp [Shellfish Allergy] Hives and Itching   Latex Itching   Penicillins Itching and Rash    Has patient had a PCN reaction causing immediate rash, facial/tongue/throat swelling, SOB or lightheadedness with hypotension: unknown Has patient had a PCN reaction causing severe rash involving mucus membranes or skin necrosis: unknown Has patient had a PCN reaction that required hospitalization unknown Has patient had a PCN reaction occurring within the last 10 years: No If all of the above answers are NO, then may proceed with Cephalosporin use.    Immunization History  Administered Date(s) Administered   Fluad Quad(high Dose 65+) 05/04/2021   Fluad Trivalent(High Dose 65+) 01/05/2023   INFLUENZA, HIGH DOSE SEASONAL PF 01/07/2024   Influenza,inj,Quad PF,6+ Mos 03/09/2015   Influenza-Unspecified 02/08/2014   PFIZER Comirnaty(Gray Top)Covid-19 Tri-Sucrose Vaccine 05/03/2019, 01/15/2020   PFIZER(Purple Top)SARS-COV-2 Vaccination 05/24/2019   PNEUMOCOCCAL CONJUGATE-20 12/06/2021   Pfizer Covid-19 Vaccine Bivalent Booster 64yrs & up 05/04/2021   Pfizer(Comirnaty)Fall Seasonal Vaccine 12 years and older 05/05/2022, 01/05/2023, 01/07/2024   Pneumococcal Conjugate-13 03/12/2017   Respiratory Syncytial Virus Vaccine,Recomb Aduvanted(Arexvy) 12/22/2021   Tdap 09/12/2015    Past Medical History:  Diagnosis Date   Abnormal EKG 2012   hospitalized for T wave inversion in lateral leads with MSK chest pains, normal ECHO, negative trops, no cardiology consult. recent EKG 7/15 stable T wave inversions.    Abnormal  EKG    hospitalized for T wave inversion in lateral leads with MSK chest pains, normal ECHO, negative trops, no cardiology consult. recent EKG 7/15 stable T wave inversions.      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   Arthritis 2000   Arthritis 2011   Atypical chest pain 12/13/2020   Bilateral leg pain 06/14/2015   Clotting disorder    blood clot kidney 2017   Contact dermatitis 01/14/2019   Heart murmur    Hyperlipidemia 2011   Hypertension 2011   Left elbow pain 03/31/2020   Leukocytosis 08/10/2022   Meniscus tear 2000   L KNEE   Pelvic pain 10/07/2018   Renal infarct 2017   blood clot in the kidney   Stomach discomfort 01/05/2023   Thickened endometrium    Vaginal itching 12/05/2018    Tobacco History: Social History   Tobacco Use  Smoking Status Former   Current packs/day: 0.00   Types: Cigarettes   Quit date: 07/29/2010   Years since quitting: 13.4   Passive exposure: Past  Smokeless Tobacco Never   Counseling given: Not Answered   Outpatient Medications Prior to Visit  Medication Sig Dispense Refill   Alirocumab  (PRALUENT ) 75 MG/ML SOAJ Inject 1 mL (75 mg total) into the skin every 14 (fourteen) days. 2 mL 5   amLODipine  (NORVASC ) 5 MG tablet TAKE 1 TABLET(5 MG) BY MOUTH DAILY 90 tablet 0   aspirin  EC 81 MG tablet Take 1 tablet (81 mg total) daily by mouth. 30 tablet 11   atorvastatin  (LIPITOR) 80 MG tablet TAKE 1 TABLET(80 MG) BY MOUTH DAILY AT 6 PM 90 tablet 3   azelastine  (ASTELIN ) 0.1 % nasal spray Place 1 spray into both nostrils 2 (two) times daily. Use in each nostril as directed 30 mL 1   carvedilol  (COREG ) 12.5 MG tablet TAKE 1 TABLET(12.5 MG) BY MOUTH TWICE DAILY WITH A MEAL 180 tablet 2   ezetimibe  (ZETIA ) 10 MG tablet Take 1 tablet (10 mg total) by mouth daily. 90 tablet 3   fluticasone  (FLONASE ) 50 MCG/ACT nasal spray SHAKE LIQUID AND USE 2 SPRAYS IN EACH NOSTRIL TWICE DAILY 16 g 1   furosemide  (LASIX ) 40 MG tablet TAKE 1 TABLET(40 MG) BY MOUTH DAILY 30 tablet 9   gabapentin  (NEURONTIN ) 300 MG capsule Take 1 capsule (300 mg total) by mouth 2 (two) times daily. 30 capsule 0   hydrochlorothiazide  (HYDRODIURIL ) 25 MG tablet Take 1 tablet (25 mg total) by  mouth daily. 30 tablet 0   isosorbide  mononitrate (IMDUR ) 60 MG 24 hr tablet TAKE 1 TABLET(60 MG) BY MOUTH DAILY 90 tablet 3   ketoconazole (NIZORAL) 2 % cream Apply 1 Application topically as needed.     ketoconazole (NIZORAL) 2 % shampoo Apply 1 Application topically as needed.     losartan  (COZAAR ) 100 MG tablet TAKE 1 TABLET(100 MG) BY MOUTH DAILY 90 tablet 3   Magnesium  Oxide, Elemental, 400 MG TABS Take 400 mg by mouth 2 (two) times daily. 90 tablet 3   Multiple Vitamin (MULITIVITAMIN WITH MINERALS) TABS Take 1 tablet by mouth daily.     nitroGLYCERIN  (NITROSTAT ) 0.4 MG SL tablet DISSOLVE ONE TABLET UNDER TONGUE AS NEEDED FOR CHEST PAIN EVERY 5 MINUTES 25 tablet 11   potassium chloride  SA (KLOR-CON  M) 20 MEQ tablet TAKE 1 TABLET(20 MEQ) BY MOUTH TWICE DAILY 180 tablet 0   azithromycin  (ZITHROMAX ) 250 MG tablet Take first 2 tablets together, then 1 every day until finished. (  Patient not taking: Reported on 01/15/2024) 6 tablet 0   benzonatate  (TESSALON ) 100 MG capsule Take 1 capsule (100 mg total) by mouth every 8 (eight) hours. (Patient not taking: Reported on 01/15/2024) 21 capsule 0   No facility-administered medications prior to visit.      Review of Systems  Review of Systems  Constitutional:  Positive for fatigue.  Respiratory: Negative.    Cardiovascular: Negative.   Psychiatric/Behavioral: Negative.  Negative for sleep disturbance.    Physical Exam  BP (!) 164/71   Pulse 64   Temp 97.7 F (36.5 C)   Ht 5' 5 (1.651 m)   Wt 204 lb 6.4 oz (92.7 kg)   SpO2 97% Comment: ra  BMI 34.01 kg/m  Physical Exam Constitutional:      Appearance: Normal appearance. She is well-developed. She is obese.  HENT:     Head: Normocephalic and atraumatic.     Mouth/Throat:     Mouth: Mucous membranes are moist.     Pharynx: Oropharynx is clear.  Eyes:     Pupils: Pupils are equal, round, and reactive to light.  Cardiovascular:     Rate and Rhythm: Normal rate and regular rhythm.      Heart sounds: Normal heart sounds. No murmur heard. Pulmonary:     Effort: Pulmonary effort is normal. No respiratory distress.     Breath sounds: Normal breath sounds. No wheezing or rhonchi.  Abdominal:     Tenderness: There is no abdominal tenderness.  Musculoskeletal:        General: Normal range of motion.     Cervical back: Normal range of motion and neck supple.  Skin:    General: Skin is warm and dry.     Findings: No erythema or rash.  Neurological:     General: No focal deficit present.     Mental Status: She is alert and oriented to person, place, and time. Mental status is at baseline.  Psychiatric:        Mood and Affect: Mood normal.        Behavior: Behavior normal.        Thought Content: Thought content normal.        Judgment: Judgment normal.     Lab Results:  CBC    Component Value Date/Time   WBC 15.7 (H) 09/25/2023 0156   RBC 3.99 09/25/2023 0156   HGB 12.7 09/25/2023 0156   HGB 11.2 09/14/2022 1643   HCT 39.8 09/25/2023 0156   HCT 34.1 09/14/2022 1643   PLT 317 09/25/2023 0156   PLT 302 09/14/2022 1643   MCV 99.7 09/25/2023 0156   MCV 94 09/14/2022 1643   MCH 31.8 09/25/2023 0156   MCHC 31.9 09/25/2023 0156   RDW 13.5 09/25/2023 0156   RDW 12.6 09/14/2022 1643   LYMPHSABS 1.9 09/14/2022 1643   MONOABS 0.8 08/07/2022 1911   EOSABS 0.3 09/14/2022 1643   BASOSABS 0.1 09/14/2022 1643    BMET    Component Value Date/Time   NA 140 09/25/2023 0156   NA 142 07/25/2023 1515   K 3.5 09/25/2023 0156   CL 104 09/25/2023 0156   CO2 25 09/25/2023 0156   GLUCOSE 119 (H) 09/25/2023 0156   BUN 18 09/25/2023 0156   BUN 19 07/25/2023 1515   CREATININE 1.29 (H) 09/25/2023 0156   CREATININE 0.74 10/14/2015 0959   CALCIUM  9.0 09/25/2023 0156   GFRNONAA 44 (L) 09/25/2023 0156   GFRNONAA 87 12/11/2013 1747   GFRAA >60 10/04/2019  2300   GFRAA >89 12/11/2013 1747    BNP No results found for: BNP  ProBNP No results found for:  PROBNP  Imaging: No results found.   Assessment & Plan:   1. Severe obstructive sleep apnea (Primary) - Split night study; Future  Assessment and Plan Assessment & Plan Moderate obstructive sleep apnea Moderate obstructive sleep apnea diagnosed in August 2022 with an average of 21 apnea events per hour. Increased risk for cardiac disease, arrhythmias, stroke, diabetes, and pulmonary hypertension. Previous CPAP therapy was discontinued after one month due to issues with usage and insurance requirements. Current symptoms include daytime somnolence, which has improved. Weight gain since last sleep study may exacerbate sleep apnea. Need repeat sleep study due to break in therapy, likely needs pressure settings adjusted due to previous tolerance issues. - Order repeat sleep study with split night titration - Retry CPAP therapy with potential adjustments to pressure settings and mask fit - Consider Inspire device if CPAP is not tolerated, pending updated sleep study - Discuss weight loss with GLP medication as a potential method to improve sleep apnea  Obesity BMI of 34 with a weight increase from 190 lbs in 2022 to 204 lbs currently. Obesity may worsen obstructive sleep apnea and increase health risks. Discussed potential for weight loss to improve sleep apnea symptoms. Consideration of weight loss medications such Zepbound, which may be covered by Medicare given the diagnosis of sleep apnea and obesity.   Denise LELON Ferrari, NP 01/15/2024

## 2024-02-05 ENCOUNTER — Ambulatory Visit (HOSPITAL_COMMUNITY)
Admission: EM | Admit: 2024-02-05 | Discharge: 2024-02-05 | Disposition: A | Discharge status: Home / Self Care | Attending: Emergency Medicine | Admitting: Emergency Medicine

## 2024-02-05 ENCOUNTER — Encounter (HOSPITAL_COMMUNITY): Payer: Self-pay

## 2024-02-05 DIAGNOSIS — J069 Acute upper respiratory infection, unspecified: Secondary | ICD-10-CM

## 2024-02-05 DIAGNOSIS — R051 Acute cough: Secondary | ICD-10-CM | POA: Diagnosis not present

## 2024-02-05 LAB — POC COVID19/FLU A&B COMBO
Covid Antigen, POC: NEGATIVE
Influenza A Antigen, POC: NEGATIVE
Influenza B Antigen, POC: NEGATIVE

## 2024-02-05 MED ORDER — ACETAMINOPHEN 325 MG PO TABS: 650.0000 mg | ORAL_TABLET | Freq: Four times a day (QID) | ORAL | 0 refills | Status: AC | PRN

## 2024-02-05 MED ORDER — BENZONATATE 200 MG PO CAPS: 200.0000 mg | ORAL_CAPSULE | Freq: Three times a day (TID) | ORAL | 0 refills | Status: DC

## 2024-02-05 NOTE — ED Triage Notes (Signed)
 Presenting with productive cough, chills, runny nose, congestion onset 3 days ago. No known sick exposure.   Patient tried tylenol  with little relief.

## 2024-02-05 NOTE — ED Provider Notes (Signed)
 [facility]    CSN: [id number] Arrival date & time: 02/05/24  1349      History   Chief Complaint Chief Complaint  Patient presents with   Cough    HPI [name] is a 73 y.o. female.   Patient presents to clinic over concern of cough, chills, nasal congestion and rhinorrhea for the past 3 days.  Cough has clear sputum.  Denies wheezing or shortness of breath.  Denies chest pain.  Reports one of her girlfriends was recently sick with similar symptoms.  Has not had fevers.  Did take Tylenol  without much improvement.  Without history of asthma or COPD.  History of smoking.  Did take blood pressure medication today.  The history is provided by the patient and medical records.  Cough   Past Medical History:  Diagnosis Date   Abnormal EKG 2012   hospitalized for T wave inversion in lateral leads with MSK chest pains, normal ECHO, negative trops, no cardiology consult. recent EKG 7/15 stable T wave inversions.    Abnormal EKG    hospitalized for T wave inversion in lateral leads with MSK chest pains, normal ECHO, negative trops, no cardiology consult. recent EKG 7/15 stable T wave inversions.      Acute cough 08/10/2022   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   Arthritis 2000   Arthritis 2011   Atypical chest pain 12/13/2020   Bilateral leg pain 06/14/2015   Clotting disorder    blood clot kidney 2017   Contact dermatitis 01/14/2019   Heart murmur    Hyperlipidemia 2011   Hypertension 2011   Left elbow pain 03/31/2020   Leukocytosis 08/10/2022   Meniscus tear 2000   L KNEE   Pelvic pain 10/07/2018   Renal infarct 2017   blood clot in the kidney   Stomach discomfort 01/05/2023   Thickened endometrium    Vaginal itching 12/05/2018    Patient Active Problem List   Diagnosis Date Noted   (HFpEF) heart failure with preserved ejection fraction (HCC) 01/07/2024   Agatston coronary artery calcium  score greater than 400 09/06/2023   Scoliosis of lumbar  spine 02/28/2022   Candida glabrata infection 02/28/2022   Daytime somnolence 01/12/2021   Abnormal cardiac CT angiography 12/13/2020   Leg edema 08/28/2018   Umbilical hernia without obstruction and without gangrene 01/01/2018   Osteopenia 09/05/2017   Closed nondisplaced spiral fracture of shaft of right tibia 03/28/2017   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/30/2015   HLD (hyperlipidemia)    Aortic atherosclerosis    Back pain 07/08/2015   Scotoma of blind spot area in visual field 06/14/2015   Seasonal allergies 03/09/2015   Flank pain 03/09/2015   Hypopigmentation 03/09/2015   Unilateral primary osteoarthritis, left knee 07/08/2014   DJD (degenerative joint disease), lumbar 07/08/2014   Osteoarthritis of both knees 07/08/2014   Urinary frequency 06/11/2014   Nearsightedness 04/27/2014   Hypertension 12/11/2013    Past Surgical History:  Procedure Laterality Date   ABDOMINAL SURGERY  2010   for bowel blockage   COLON SURGERY  2010   bowel blockage   COLONOSCOPY  09/2014   HYSTEROSCOPY WITH D & C N/A 05/08/2018   Procedure: DILATATION AND CURETTAGE /HYSTEROSCOPY;  Surgeon: Alger Gong, MD;  Location: [facility];  Service: Gynecology;  Laterality: N/A;   KNEE ARTHROSCOPY Left 09/25/2013   Procedure: LEFT KNEE ARTHROSCOPY WITH PARTIAL MEDIAL AND LATERAL  MENISCECTOMY/DEBRIDEMENT/MICRO FRACTURE MEDIAL FEMORAL CONDYLE, REMOVAL OF OSTEOCONDRAL FRAGMENT;  Surgeon: Reyes JAYSON Billing, MD;  Location: [facility];  Service: Orthopedics;  Laterality: Left;   KNEE SURGERY Right 1999   LEFT HEART CATH AND CORONARY ANGIOGRAPHY N/A 12/13/2020   Procedure: LEFT HEART CATH AND CORONARY ANGIOGRAPHY;  Surgeon: Mady Bruckner, MD;  Location: [facility];  Service: Cardiovascular;  Laterality: N/A;   POLYPECTOMY     TEE WITHOUT CARDIOVERSION N/A 10/04/2015   Procedure: TRANSESOPHAGEAL ECHOCARDIOGRAM (TEE);  Surgeon: Wilbert JONELLE Bihari, MD;  Location: [facility];   Service: Cardiovascular;  Laterality: N/A;   TUBAL LIGATION  1978    OB History     Gravida  2   Para  2   Term      Preterm      AB      Living  2      SAB      IAB      Ectopic      Multiple      Live Births               Home Medications    Prior to Admission medications   Medication Sig Start Date End Date Taking? Authorizing Provider  acetaminophen  (TYLENOL ) 325 MG tablet Take 2 tablets (650 mg total) by mouth every 6 (six) hours as needed. 02/05/24  Yes Lillie Portner  N, FNP  Alirocumab  (PRALUENT ) 75 MG/ML SOAJ Inject 1 mL (75 mg total) into the skin every 14 (fourteen) days. 09/13/23  Yes Kate Bruckner CROME, MD  amLODipine  (NORVASC ) 5 MG tablet TAKE 1 TABLET(5 MG) BY MOUTH DAILY 11/01/23  Yes Rosendo Rush, MD  aspirin  EC 81 MG tablet Take 1 tablet (81 mg total) daily by mouth. 01/31/17  Yes Kristy Sharolyn Lenis, PA-C  atorvastatin  (LIPITOR) 80 MG tablet TAKE 1 TABLET(80 MG) BY MOUTH DAILY AT 6 PM 11/21/23  Yes Kate Bruckner CROME, MD  azelastine  (ASTELIN ) 0.1 % nasal spray Place 1 spray into both nostrils 2 (two) times daily. Use in each nostril as directed 06/18/23  Yes Reddick, Johnathan B, NP  benzonatate  (TESSALON ) 200 MG capsule Take 1 capsule (200 mg total) by mouth every 8 (eight) hours. 02/05/24  Yes Saburo Luger  N, FNP  carvedilol  (COREG ) 12.5 MG tablet TAKE 1 TABLET(12.5 MG) BY MOUTH TWICE DAILY WITH A MEAL 11/19/23  Yes Rosendo Rush, MD  ezetimibe  (ZETIA ) 10 MG tablet Take 1 tablet (10 mg total) by mouth daily. 01/29/23 02/05/24 Yes Kate Bruckner CROME, MD  fluticasone  (FLONASE ) 50 MCG/ACT nasal spray SHAKE LIQUID AND USE 2 SPRAYS IN EACH NOSTRIL TWICE DAILY 01/07/24  Yes Shitarev, Dimitry, MD  furosemide  (LASIX ) 40 MG tablet TAKE 1 TABLET(40 MG) BY MOUTH DAILY 09/24/23  Yes Rosendo Rush, MD  gabapentin  (NEURONTIN ) 300 MG capsule Take 1 capsule (300 mg total) by mouth 2 (two) times daily. 03/24/23  Yes Patt Lenis Macho, MD   hydrochlorothiazide  (HYDRODIURIL ) 25 MG tablet Take 1 tablet (25 mg total) by mouth daily. 03/24/23  Yes Patt Lenis Macho, MD  isosorbide  mononitrate (IMDUR ) 60 MG 24 hr tablet TAKE 1 TABLET(60 MG) BY MOUTH DAILY 09/25/23  Yes Kate Bruckner CROME, MD  ketoconazole (NIZORAL) 2 % cream Apply 1 Application topically as needed. 11/13/22  Yes [provider]  ketoconazole (NIZORAL) 2 % shampoo Apply 1 Application topically as needed. 11/13/22  Yes [provider]  losartan  (COZAAR ) 100 MG tablet TAKE 1 TABLET(100 MG) BY MOUTH DAILY 08/21/23  Yes Rosendo Rush, MD  Magnesium  Oxide, Elemental, 400 MG TABS Take 400 mg by  mouth 2 (two) times daily. 07/26/23  Yes Kate Lonni CROME, MD  Multiple Vitamin (MULITIVITAMIN WITH MINERALS) TABS Take 1 tablet by mouth daily.   Yes [provider]  nitroGLYCERIN  (NITROSTAT ) 0.4 MG SL tablet DISSOLVE ONE TABLET UNDER TONGUE AS NEEDED FOR CHEST PAIN EVERY 5 MINUTES 08/08/23  Yes Kate Lonni CROME, MD  potassium chloride  SA (KLOR-CON  M) 20 MEQ tablet TAKE 1 TABLET(20 MEQ) BY MOUTH TWICE DAILY 07/10/23  Yes Rosendo Rush, MD    Family History Family History  Problem Relation Age of Onset   Hypertension Mother    Heart disease Mother        has a pacemaker    Alzheimer's disease Mother    Hyperlipidemia Mother    Osteoporosis Mother    Heart failure Father    Hypertension Father    Alzheimer's disease Father    Hypertension Sister    Breast cancer Sister    Hypertension Sister    Gout Sister    Heart disease Son    Alcohol abuse Son    Pancreatitis Son    Hypertension Son    Cancer Neg Hx    Colon cancer Neg Hx    Esophageal cancer Neg Hx    Stomach cancer Neg Hx    Rectal cancer Neg Hx     Social History Social History   Tobacco Use   Smoking status: Former    Current packs/day: 0.00    Types: Cigarettes    Quit date: 07/29/2010    Years since quitting: 13.5    Passive exposure: Past   Smokeless tobacco:  Never  Vaping Use   Vaping status: Never Used  Substance Use Topics   Alcohol use: Not Currently   Drug use: Never     Allergies   Shrimp [shellfish allergy], Latex, and Penicillins   Review of Systems Review of Systems  Per HPI  Physical Exam Triage Vital Signs ED Triage Vitals  Encounter Vitals Group     BP 02/05/24 1433 (!) 180/69     Girls Systolic BP Percentile --      Girls Diastolic BP Percentile --      Boys Systolic BP Percentile --      Boys Diastolic BP Percentile --      Pulse Rate 02/05/24 1433 70     Resp 02/05/24 1433 18     Temp 02/05/24 1433 98.8 F (37.1 C)     Temp Source 02/05/24 1433 Oral     SpO2 02/05/24 1433 94 %     Weight --      Height 02/05/24 1433 5' 5 (1.651 m)     Head Circumference --      Peak Flow --      Pain Score 02/05/24 1428 0     Pain Loc --      Pain Education --      Exclude from Growth Chart --    No data found.  Updated Vital Signs BP (!) 180/69 (BP Location: Left Arm)   Pulse 70   Temp 98.8 F (37.1 C) (Oral)   Resp 18   Ht 5' 5 (1.651 m)   SpO2 94%   BMI 34.01 kg/m   Visual Acuity Right Eye Distance:   Left Eye Distance:   Bilateral Distance:    Right Eye Near:   Left Eye Near:    Bilateral Near:     Physical Exam Vitals and nursing note reviewed.  Constitutional:  Appearance: Normal appearance.  HENT:     Head: Normocephalic and atraumatic.     Right Ear: External ear normal.     Left Ear: External ear normal.     Nose: Congestion present.     Mouth/Throat:     Mouth: Mucous membranes are moist.     Pharynx: Posterior oropharyngeal erythema present.  Eyes:     Conjunctiva/sclera: Conjunctivae normal.  Cardiovascular:     Rate and Rhythm: Normal rate and regular rhythm.     Heart sounds: Normal heart sounds. No murmur heard. Pulmonary:     Effort: Pulmonary effort is normal. No respiratory distress.     Breath sounds: Normal breath sounds.  Skin:    General: Skin is warm and dry.   Neurological:     General: No focal deficit present.     Mental Status: She is alert and oriented to person, place, and time.  Psychiatric:        Mood and Affect: Mood normal.        Behavior: Behavior normal.      UC Treatments / Results  Labs (all labs ordered are listed, but only abnormal results are displayed) Labs Reviewed  POC COVID19/FLU A&B COMBO    EKG   Radiology No results found.  Procedures Procedures (including critical care time)  Medications Ordered in UC Medications - No data to display  Initial Impression / Assessment and Plan / UC Course  I have reviewed the triage vital signs and the nursing notes.  Pertinent labs & imaging results that were available during my care of the patient were reviewed by me and considered in my medical decision making (see chart for details).  Vitals and triage reviewed, patient is hemodynamically stable.  Congestion and postnasal drip present on physical exam.  Lungs vesicular, heart with regular rate and rhythm.  COVID and flu testing negative, suspect other viral URI.  Low concern for bacterial ideology at this time such as pneumonia, afebrile, vesicular lung sounds.  Hypertensive in clinic, reports compliance with blood pressure medication.  Symptomatic management for viral illness discussed.  Tessalon  sent in for cough.  Plan of care, follow-up care return precautions given, no questions at this time.     Final Clinical Impressions(s) / UC Diagnoses   Final diagnoses:  Acute cough  Viral URI with cough     Discharge Instructions      COVID and flu testing were negative, he was likely have a different viral illness.  You take the Tessalon  Perles every 8 hours as needed for cough.  Take Tylenol  every 6 hours as needed for pain.  Ensure you are drinking at least 64 ounces of water daily and getting plenty of rest.  Viral illnesses typically last 5 to 7 days in duration.  If you develop any new concerning  symptoms or have prolonged symptoms please follow-up with your primary care provider or return to clinic for reevaluation.   ED Prescriptions     Medication Sig Dispense Auth. Provider   benzonatate  (TESSALON ) 200 MG capsule Take 1 capsule (200 mg total) by mouth every 8 (eight) hours. 30 capsule Dreama, Graeme Menees  N, FNP   acetaminophen  (TYLENOL ) 325 MG tablet Take 2 tablets (650 mg total) by mouth every 6 (six) hours as needed. 30 tablet Dreama, Minnetta Sandora  N, FNP      PDMP not reviewed this encounter.   Dreama Marilyne SAILOR, FNP 02/05/24 1519

## 2024-02-05 NOTE — Discharge Instructions (Addendum)
 COVID and flu testing were negative, he was likely have a different viral illness.  You take the Tessalon  Perles every 8 hours as needed for cough.  Take Tylenol  every 6 hours as needed for pain.  Ensure you are drinking at least 64 ounces of water daily and getting plenty of rest.  Viral illnesses typically last 5 to 7 days in duration.  If you develop any new concerning symptoms or have prolonged symptoms please follow-up with your primary care provider or return to clinic for reevaluation.

## 2024-02-07 ENCOUNTER — Telehealth (HOSPITAL_COMMUNITY): Payer: Self-pay | Admitting: *Deleted

## 2024-02-07 MED ORDER — BENZONATATE 100 MG PO CAPS
100.0000 mg | ORAL_CAPSULE | Freq: Three times a day (TID) | ORAL | 0 refills | Status: AC
Start: 2024-02-07 — End: ?

## 2024-02-07 NOTE — Telephone Encounter (Signed)
 Pt would like benzonate 100mg  since its cheaper than 200mg 

## 2024-02-19 ENCOUNTER — Other Ambulatory Visit: Payer: Self-pay | Admitting: Family Medicine

## 2024-02-19 ENCOUNTER — Other Ambulatory Visit: Payer: Self-pay | Admitting: Student

## 2024-02-19 DIAGNOSIS — I10 Essential (primary) hypertension: Secondary | ICD-10-CM

## 2024-02-19 DIAGNOSIS — J302 Other seasonal allergic rhinitis: Secondary | ICD-10-CM

## 2024-03-07 ENCOUNTER — Encounter: Payer: Self-pay | Admitting: Family Medicine

## 2024-03-07 ENCOUNTER — Ambulatory Visit: Admitting: Family Medicine

## 2024-03-07 VITALS — BP 173/86 | HR 83 | Ht 65.0 in | Wt 207.8 lb

## 2024-03-07 DIAGNOSIS — I10 Essential (primary) hypertension: Secondary | ICD-10-CM

## 2024-03-07 DIAGNOSIS — N898 Other specified noninflammatory disorders of vagina: Secondary | ICD-10-CM

## 2024-03-07 DIAGNOSIS — R600 Localized edema: Secondary | ICD-10-CM

## 2024-03-07 LAB — POCT WET PREP (WET MOUNT)
Clue Cells Wet Prep Whiff POC: NEGATIVE
Trichomonas Wet Prep HPF POC: ABSENT

## 2024-03-07 MED ORDER — FLUCONAZOLE 150 MG PO TABS: 150.0000 mg | ORAL_TABLET | Freq: Once | ORAL | 0 refills | Status: AC

## 2024-03-07 NOTE — Patient Instructions (Addendum)
 It was wonderful to see you today! Thank you for choosing [facility].   Please bring ALL of your medications with you to every visit.   Today we talked about:  Please double your Lasix  for the next 3 days and take an extra dose in the evening to help with the extra volume.  I would also like you to take 1 extra pill of your potassium to ensure you do not get too low with extra diuresis.  I am also going to order an ultrasound of your heart to be done since you have not had 1 in quite some time.  If you stop eating or if you feel like you are gaining more weight and volume rapidly please return to care sooner for more urgent evaluation particularly if you develop chest pain or shortness of breath. We are swabbing you for vaginal infections today, we will provide treatment to help with your symptoms.  Please avoid any shaving or fragranced body products near your vagina as this can further irritate your symptoms.  I am suspicious that you might need some vaginal estrogen as the irritation could be due to friction in the vaginal folds.  I will follow-up with you regarding the results of the swab and we can discuss additional treatment at that time.  Please follow up in 4 days on 12/16   We are checking some labs today. If they are abnormal, I will call you. If they are normal, I will send you a MyChart message (if it is active) or a letter in the mail. If you do not hear about your labs in the next 2 weeks, please call the office.  Call the clinic at [phone] if your symptoms worsen or you have any concerns.  Please be sure to schedule follow up at the front desk before you leave today.   Izetta Nap, DO Family Medicine

## 2024-03-07 NOTE — Progress Notes (Signed)
° ° °  SUBJECTIVE:   CHIEF COMPLAINT / HPI:   Discussed the use of AI scribe software for clinical note transcription with the patient, who gave verbal consent to proceed.  Peripheral edema and abdominal fullness - Bilateral leg swelling present since Monday - Associated with bloating and abdominal fullness - Similar episodes have occurred in the distant past - Currently taking Lasix  40 mg once daily; took dose today - No chest pain or shortness of breath  Vaginal irritation - Severe vaginal itching causing significant discomfort and local irritation - No new sexual partners in the past three months      PERTINENT  PMH / PSH: CKD3a, HTN, CAD, HFpEF  OBJECTIVE:   BP (!) 173/86   Pulse 83   Ht 5' 5 (1.651 m)   Wt 207 lb 12.8 oz (94.3 kg)   SpO2 97%   BMI 34.58 kg/m    General: NAD, pleasant, able to participate in exam HEENT: No prominent JVD noted. Cardiac: RRR, no murmurs. Respiratory: CTAB, normal effort, No wheezes, rales or rhonchi Abdomen: Bowel sounds present, nontender, nondistended Extremities: Trace peripheral edema bilaterally.  Nonpitting. Skin: warm and dry, no rashes noted Neuro: alert, no obvious focal deficits Psych: Normal affect and mood Pelvic exam: normal external genitalia, vulva, vagina, cervix, uterus and adnexa, VAGINA: normal appearing vagina with normal color and discharge, no lesions, WET MOUNT done - results: hyphae, exam chaperoned by Dese[place], CMA.   ASSESSMENT/PLAN:   Assessment & Plan Peripheral edema Minimal upon exam, appears to be around 3 pounds above dry weight.  Last echo in 2022, EF 65 to 70%, will obtain repeat echo.  Recommend doubling Lasix  and taking extra dose of potassium x 4 days and assess for symptomatic improvement in weight next week.  ED return precautions discussed. -Increase to Lasix  40 mg twice daily x 4 days -Increase to potassium 60 mill equivalents daily x 4 days -Echo -Follow-up scheduled for 12/16 for continued  evaluation Vaginal irritation Wet prep positive for yeast, will treat symptomatically.  Recommended against shaving and fragrance body products.  May benefit from vaginal estrogen in the future given atrophy upon exam. -Fluconazole  150 mg x 1 Primary hypertension Uncontrolled during visit today, has been previously poorly controlled on current regimen.  Denied additional management today due to vaginal irritation/pain.  Recommend discussing medication adjustment at follow-up.   Dr. Izetta Nap, DO [facility]

## 2024-03-07 NOTE — Assessment & Plan Note (Signed)
 Uncontrolled during visit today, has been previously poorly controlled on current regimen.  Denied additional management today due to vaginal irritation/pain.  Recommend discussing medication adjustment at follow-up.

## 2024-03-08 ENCOUNTER — Ambulatory Visit: Payer: Self-pay | Admitting: Family Medicine

## 2024-03-08 LAB — BASIC METABOLIC PANEL WITH GFR
BUN/Creatinine Ratio: 12 (ref 12–28)
BUN: 12 mg/dL (ref 8–27)
CO2: 26 mmol/L (ref 20–29)
Calcium: 9.6 mg/dL (ref 8.7–10.3)
Chloride: 103 mmol/L (ref 96–106)
Creatinine, Ser: 1.03 mg/dL — ABNORMAL HIGH (ref 0.57–1.00)
Glucose: 95 mg/dL (ref 70–99)
Potassium: 3.5 mmol/L (ref 3.5–5.2)
Sodium: 144 mmol/L (ref 134–144)
eGFR: 57 mL/min/1.73 — ABNORMAL LOW (ref >59)

## 2024-03-11 ENCOUNTER — Ambulatory Visit: Admitting: Family Medicine

## 2024-03-11 ENCOUNTER — Encounter: Payer: Self-pay | Admitting: Family Medicine

## 2024-03-11 VITALS — BP 170/76 | HR 72 | Ht 65.0 in | Wt 202.0 lb

## 2024-03-11 DIAGNOSIS — I5032 Chronic diastolic (congestive) heart failure: Secondary | ICD-10-CM | POA: Diagnosis not present

## 2024-03-11 DIAGNOSIS — R600 Localized edema: Secondary | ICD-10-CM | POA: Diagnosis not present

## 2024-03-11 DIAGNOSIS — R10A2 Flank pain, left side: Secondary | ICD-10-CM

## 2024-03-11 DIAGNOSIS — I10 Essential (primary) hypertension: Secondary | ICD-10-CM

## 2024-03-11 DIAGNOSIS — N898 Other specified noninflammatory disorders of vagina: Secondary | ICD-10-CM | POA: Diagnosis not present

## 2024-03-11 DIAGNOSIS — E785 Hyperlipidemia, unspecified: Secondary | ICD-10-CM | POA: Diagnosis not present

## 2024-03-11 MED ORDER — AMLODIPINE-VALSARTAN-HCTZ 10-160-25 MG PO TABS: 1.0000 | ORAL_TABLET | Freq: Every day | ORAL | 0 refills | Status: AC

## 2024-03-11 MED ORDER — ESTRADIOL 0.01 % VA CREA: 1.0000 | TOPICAL_CREAM | Freq: Every day | VAGINAL | 12 refills | Status: AC

## 2024-03-11 MED ORDER — DICLOFENAC SODIUM 1 % EX GEL: 2.0000 g | Freq: Four times a day (QID) | CUTANEOUS | 1 refills | Status: DC | PRN

## 2024-03-11 MED ORDER — BLOOD PRESSURE CUFF MISC: 0 refills | Status: AC

## 2024-03-11 NOTE — Patient Instructions (Addendum)
 Thank you for coming in today! Here is a summary of what we discussed:  I am switching your blood pressure meds a bit- you will start taking a new pill that is a combination of 3 medicines.  START taking the new pill (hydrochlorothiazide , amlodipine , valsartan  combination) STOP taking the amlodipine  (norvasc ), losartan  (cozaar ), and hydrochlorothiazide  tablets.   I sent a referral to our pharmacist, Dr Koval. Please schedule an appointment with him in the next few weeks.  Please check your blood pressure at home and keep a log for your next appt.  You can use voltaren  cream for your side pain. Please schedule a follow up if it does not improve or if it gets worse.  Please use the vaginal estrogen nightly. After a few weeks you can switch to every other night or 2-3 times per week.  We are checking some labs today. If they are abnormal, I will call you. If they are normal, I will send you a MyChart message (if it is active) or a letter in the mail. If you do not hear about your labs in the next 2 weeks, please call the office.   Please call the clinic at [phone] if your symptoms worsen or you have any concerns.  Best, Dr Adele

## 2024-03-11 NOTE — Assessment & Plan Note (Signed)
 Bilateral lower extremity edema much improved since last visit and not volume overloaded on exam.  Will check CMP to ensure stable renal function and potassium after taking increased dose of Lasix  and potassium supplements in last few days.  Scheduled for repeat echo in 10 days to ensure no worsening of heart failure. - Comprehensive metabolic panel with GFR

## 2024-03-11 NOTE — Assessment & Plan Note (Signed)
 Unclear etiology, no suspicion for UTI, traumatic injury, GI etiology.  May be musculoskeletal.  Recommended Voltaren  or IcyHot as needed.  Will obtain CMP to ensure no renal or electrolyte abnormalities. - diclofenac  Sodium (VOLTAREN ) 1 % GEL; Apply 2 g topically 4 (four) times daily as needed (pain).  Dispense: 150 g; Refill: 1 - Comprehensive metabolic panel with GFR

## 2024-03-11 NOTE — Assessment & Plan Note (Signed)
 Continues to have elevated readings on 5 agents.  Will simplify regimen with triple combination pill.  Referring to [facility] pharmacy team for further management.  May need to send to cardiology hypertension clinic moving forward if remains poorly controlled. - amLODIPine -Valsartan -HCTZ 10-160-25 MG TABS; Take 1 tablet by mouth daily.  Dispense: 90 tablet; Refill: 0 - AMB Referral VBCI Care Management - Blood Pressure Monitoring (BLOOD PRESSURE CUFF) MISC; Check blood pressure once daily  Dispense: 1 each; Refill: 0

## 2024-03-11 NOTE — Progress Notes (Signed)
 SUBJECTIVE:   CHIEF COMPLAINT / HPI:   BLE edema f/u -Took Lasix  40 mg twice daily x 4 days -Took potassium 60 mill equivalents daily x 4 days -No SOB, chest pain, no orthopnea -states BLE edema has improved -echo scheduled for 12/22  HTN -amlodipine  5mg , coreg  12.5mg , imdur  60mg , losartan  100mg , hydrochlorothiazide  25mg -- has been taking daily -home readings 160s-170s/70-80s -no headaches or blurred vision  Pain in the L side -started 2 weeks ago -constant, aching -no change in activity, has not felt like moving much due to pain -no dysuria -no diarrhea or constipation -no N/V -no trauma -has tried tylenol , not much help -normal PO intake  Vaginal itching -- Was prescribed a single tablet of diflucan  at last appointment, states this did not work --no vaginal discharge --has vaginal dryness --would like to try vaginal estrogen  HLD -- Not discussed during visit but noted that patient had future lipid panel ordered but has not been completed.  Also noticed that Zetia  has expired. -- Patient also prescribed 80 mg atorvastatin    PERTINENT  PMH / PSH: reviewed  OBJECTIVE:   BP (!) 170/76   Pulse 72   Ht 5' 5 (1.651 m)   Wt 202 lb (91.6 kg)   SpO2 97%   BMI 33.61 kg/m   General: Awake and conversant, no acute distress CV: Normal S1/S2, RRR, no M/R/G Pulm: CTAB, normal work of breathing on room air, no W/R/R Abd: normal BS, soft, nontender, nondistended MSK: no CVA tenderness BL Ext: trace edema to BLE Neuro: No focal deficits Psych: Appropriate mood and affect   ASSESSMENT/PLAN:   Assessment & Plan Chronic heart failure with preserved ejection fraction (HFpEF) (HCC) Leg edema Bilateral lower extremity edema much improved since last visit and not volume overloaded on exam.  Will check CMP to ensure stable renal function and potassium after taking increased dose of Lasix  and potassium supplements in last few days.  Scheduled for repeat echo in 10 days  to ensure no worsening of heart failure. - Comprehensive metabolic panel with GFR Vaginal irritation Given ongoing symptoms after Diflucan  and lack of vaginal discharge, suspect vaginal atrophy and postmenopausal patient.  Will initiate vaginal estradiol .  Advised patient to use daily for a few weeks then switch to every other day and then a few times a week. - estradiol  (ESTRACE ) 0.01 % CREA vaginal cream; Place 1 Applicatorful vaginally at bedtime.  Dispense: 42.5 g; Refill: 12 Primary hypertension Continues to have elevated readings on 5 agents.  Will simplify regimen with triple combination pill.  Referring to [facility] pharmacy team for further management.  May need to send to cardiology hypertension clinic moving forward if remains poorly controlled. - amLODIPine -Valsartan -HCTZ 10-160-25 MG TABS; Take 1 tablet by mouth daily.  Dispense: 90 tablet; Refill: 0 - AMB Referral VBCI Care Management - Blood Pressure Monitoring (BLOOD PRESSURE CUFF) MISC; Check blood pressure once daily  Dispense: 1 each; Refill: 0 Left flank pain Unclear etiology, no suspicion for UTI, traumatic injury, GI etiology.  May be musculoskeletal.  Recommended Voltaren  or IcyHot as needed.  Will obtain CMP to ensure no renal or electrolyte abnormalities. - diclofenac  Sodium (VOLTAREN ) 1 % GEL; Apply 2 g topically 4 (four) times daily as needed (pain).  Dispense: 150 g; Refill: 1 - Comprehensive metabolic panel with GFR Hyperlipidemia, unspecified hyperlipidemia type Prescribed 80 mg Lipitor and 10 mg Zetia  daily.  Zetia  has expired so unlikely that patient has been taking this. Unfortunately did not obtain lipid panel  today.  Recommend this at follow-up appointment.     Rea Raring, MD [facility]

## 2024-03-11 NOTE — Assessment & Plan Note (Signed)
 Prescribed 80 mg Lipitor and 10 mg Zetia  daily.  Zetia  has expired so unlikely that patient has been taking this. Unfortunately did not obtain lipid panel today.  Recommend this at follow-up appointment.

## 2024-03-12 ENCOUNTER — Other Ambulatory Visit: Payer: Self-pay | Admitting: Family Medicine

## 2024-03-12 ENCOUNTER — Encounter: Payer: Self-pay | Admitting: Podiatry

## 2024-03-12 ENCOUNTER — Ambulatory Visit (INDEPENDENT_AMBULATORY_CARE_PROVIDER_SITE_OTHER): Admitting: Podiatry

## 2024-03-12 DIAGNOSIS — B351 Tinea unguium: Secondary | ICD-10-CM | POA: Diagnosis not present

## 2024-03-12 DIAGNOSIS — M79675 Pain in left toe(s): Secondary | ICD-10-CM | POA: Diagnosis not present

## 2024-03-12 DIAGNOSIS — M79674 Pain in right toe(s): Secondary | ICD-10-CM

## 2024-03-12 DIAGNOSIS — Z1231 Encounter for screening mammogram for malignant neoplasm of breast: Secondary | ICD-10-CM

## 2024-03-12 LAB — COMPREHENSIVE METABOLIC PANEL WITH GFR
ALT: 13 IU/L (ref 0–32)
AST: 18 IU/L (ref 0–40)
Albumin: 4.2 g/dL (ref 3.8–4.8)
Alkaline Phosphatase: 122 IU/L (ref 49–135)
BUN/Creatinine Ratio: 13 (ref 12–28)
BUN: 16 mg/dL (ref 8–27)
Bilirubin Total: 0.4 mg/dL (ref 0.0–1.2)
CO2: 26 mmol/L (ref 20–29)
Calcium: 9.6 mg/dL (ref 8.7–10.3)
Chloride: 101 mmol/L (ref 96–106)
Creatinine, Ser: 1.19 mg/dL — ABNORMAL HIGH (ref 0.57–1.00)
Globulin, Total: 2.6 g/dL (ref 1.5–4.5)
Glucose: 103 mg/dL — ABNORMAL HIGH (ref 70–99)
Potassium: 3.9 mmol/L (ref 3.5–5.2)
Sodium: 142 mmol/L (ref 134–144)
Total Protein: 6.8 g/dL (ref 6.0–8.5)
eGFR: 48 mL/min/1.73 — ABNORMAL LOW (ref >59)

## 2024-03-12 NOTE — Progress Notes (Signed)

## 2024-03-13 ENCOUNTER — Telehealth: Payer: Self-pay | Admitting: Pharmacist

## 2024-03-13 NOTE — Telephone Encounter (Signed)
 Patient contacted for follow-up of need for scheduling blood pressure follow-up  Since last contact patient reports she has picked up the new 3 in 1 blood pressure pill.  She asks if she was supposed to stop any thing or continue all the previous medication.   I clarified amlodipine , losartan  and hydrochlorothiazide  individual agent were replaced the the combination 3 in 1 and she should stop all three of those agent in individual form.   Asked her to continue checking and recording home blood pressure readings ~ 5 per week until visit scheduled with me on Tuesday 04/01/2024  Total time with patient call and documentation of interaction: 12 minutes.

## 2024-03-14 ENCOUNTER — Ambulatory Visit: Payer: Self-pay | Admitting: Family Medicine

## 2024-03-17 ENCOUNTER — Ambulatory Visit (HOSPITAL_COMMUNITY)
Admission: RE | Admit: 2024-03-17 | Discharge: 2024-03-17 | Disposition: A | Discharge status: Home / Self Care | Source: Ambulatory Visit | Attending: Family Medicine | Admitting: Family Medicine

## 2024-03-17 DIAGNOSIS — I358 Other nonrheumatic aortic valve disorders: Secondary | ICD-10-CM | POA: Insufficient documentation

## 2024-03-17 DIAGNOSIS — E785 Hyperlipidemia, unspecified: Secondary | ICD-10-CM | POA: Insufficient documentation

## 2024-03-17 DIAGNOSIS — I350 Nonrheumatic aortic (valve) stenosis: Secondary | ICD-10-CM | POA: Diagnosis not present

## 2024-03-17 DIAGNOSIS — R600 Localized edema: Secondary | ICD-10-CM | POA: Insufficient documentation

## 2024-03-17 DIAGNOSIS — I509 Heart failure, unspecified: Secondary | ICD-10-CM | POA: Diagnosis not present

## 2024-03-17 DIAGNOSIS — I110 Hypertensive heart disease with heart failure: Secondary | ICD-10-CM | POA: Insufficient documentation

## 2024-03-17 MED ORDER — PERFLUTREN LIPID MICROSPHERE
1.0000 mL | INTRAVENOUS | Status: AC | PRN
  Administered 2024-03-17: 3 mL via INTRAVENOUS

## 2024-03-18 ENCOUNTER — Telehealth: Payer: Self-pay

## 2024-03-18 LAB — ECHOCARDIOGRAM COMPLETE
AR max vel: 1.94 cm2
AV Area VTI: 1.88 cm2
AV Area mean vel: 1.88 cm2
AV Mean grad: 7.7 mmHg
AV Peak grad: 15.1 mmHg
Ao pk vel: 1.94 m/s
Area-P 1/2: 2.42 cm2
S' Lateral: 2 cm

## 2024-03-18 NOTE — Telephone Encounter (Signed)
 Patient calls nurse line requesting results from ECHO.  Will froward to provider who saw patient for concern.

## 2024-03-19 NOTE — Telephone Encounter (Signed)
 Discussed results with patient

## 2024-03-24 ENCOUNTER — Ambulatory Visit

## 2024-03-24 VITALS — BP 173/73 | HR 79 | Ht 65.0 in | Wt 201.6 lb

## 2024-03-24 DIAGNOSIS — M25562 Pain in left knee: Secondary | ICD-10-CM | POA: Diagnosis not present

## 2024-03-24 DIAGNOSIS — M25561 Pain in right knee: Secondary | ICD-10-CM

## 2024-03-24 DIAGNOSIS — R1030 Lower abdominal pain, unspecified: Secondary | ICD-10-CM | POA: Diagnosis not present

## 2024-03-24 MED ORDER — LIDOCAINE 5 % EX PTCH: 2.0000 | MEDICATED_PATCH | CUTANEOUS | 0 refills | Status: AC

## 2024-03-24 MED ORDER — DICLOFENAC SODIUM 1 % EX GEL: 4.0000 g | Freq: Four times a day (QID) | CUTANEOUS | 1 refills | Status: AC | PRN

## 2024-03-24 MED ORDER — ACETAMINOPHEN 325 MG PO TABS: 975.0000 mg | ORAL_TABLET | Freq: Four times a day (QID) | ORAL | Status: AC

## 2024-03-24 NOTE — Addendum Note (Signed)
 Addended by: CORALEE ELDER on: 03/24/2024 04:42 PM   Modules accepted: Orders

## 2024-03-24 NOTE — Progress Notes (Signed)
" ° ° °  SUBJECTIVE:   CHIEF COMPLAINT / HPI:   [name] is a 73 YO female present at the [facility] with following concerns  Abdominal pain Patient states she has abdominal pain and knee pain starting about 2 weeks ago which prompt her to come visit the [facility] clinic on 03/11/24 with c/o kidney injury per patient. Pt states she has been w/ this abdominal pain w/ especially on the right lower abdomen which radiate to her right groin. Patient with history of  bowel blockage  2010 which required colon surgery. Patient states her symptoms this time seems similar to what she had previously during bowel blockage episode. Last BM was this morning after taking laxative. She denies N/V or blood in stool.    Bilateral Knee pain Patient c/o swollen knees and pain with ambulation she has been taking Tylenol  325 mg as needed which not seem to help improved her pain  PERTINENT  PMH / PSH:  HTN Bilateral knee OA DJD HLD Clotting disorder       OBJECTIVE:   BP (!) 173/73   Pulse 79   Ht 5' 5 (1.651 m)   Wt 201 lb 9.6 oz (91.4 kg)   SpO2 100%   BMI 33.55 kg/m   Physical Exam Constitutional:      Appearance: She is well-developed.  Cardiovascular:     Rate and Rhythm: Normal rate.  Pulmonary:     Effort: Pulmonary effort is normal.     Breath sounds: Normal breath sounds.  Abdominal:     Palpations: Abdomen is soft.     Tenderness: There is abdominal tenderness in the right lower quadrant and left lower quadrant.  Neurological:     Mental Status: She is alert and oriented to person, place, and time.      ASSESSMENT/PLAN:   Assessment & Plan Lower abdominal pain Pt hemodynamically stable but c/o severe abdominal pain which radiate to the groin. Labs 2 weeks ago w/o c/o acute infection but w/ Hx of abdominal surgery - bowel blockage is in differential. Pt also w/ hx of D&C no vaginal bleeding  - Place STAT order for CT abdominal w/ contrast. Pending scheduling.  - Recommending patient to  visit ED if develop fever, N/V, SOB or worsening s/s - f/u in 1 week Acute pain of both knees Her knees pain could radiated from groins/abdominal pain. I hesitate to give her anything stronger / narcotic to help control her knee pains as she may have bowel blockage - Increase Tylenol  to 975 mg Q6H schedule - Prescribed lidocaine  patches for knee pain     Houston Samuels, DO PGY 1 Family Medicine Resident  [facility] "

## 2024-03-24 NOTE — Patient Instructions (Signed)
" ° °  It was great to see you!  Our plans for today:  - Please waiting for a call from radiology to make a schedule for your CT abdominal scan  - Take Tylenol  925 mg every 6 hrs for pain control - Apply Lidocaine  patch on both knees with heat pack to help with knee pain - follow up on Monday next week - If your abdominal pain become worse or if you experiencing SOB, nauseated, or throwing up, or fever please visit Emergency Room   Take care and seek immediate care sooner if you develop any concerns.    [name] HAS PGY 1 Family Medicine Resident [facility]  [address] [place], [place] [address] Fax [phone] Phone [phone] 03/24/2024, 3:26 PM  "

## 2024-03-25 ENCOUNTER — Ambulatory Visit (HOSPITAL_COMMUNITY)

## 2024-03-26 ENCOUNTER — Telehealth: Payer: Self-pay

## 2024-03-26 ENCOUNTER — Other Ambulatory Visit (HOSPITAL_COMMUNITY): Payer: Self-pay

## 2024-03-26 NOTE — Telephone Encounter (Signed)
 Patient presents to clinic regarding issues with upcoming imaging. She reports concerns with picking up contrast for imaging appointment on 03/28/24.  She states that her vision is too blurry to drive to [facility] to pick up contrast solution. She reports that vision has been blurry for about one month. She is not sure how she is going to get to GI for the contrast. Patient tearful and frustrated in front office. Advised patient that she should not be driving due to reported severe blurry vision.  Spoke with preceptor, Dr. Delores regarding patient. Advised that we recommend OV follow up for concerns.   Offered patient same day appt this afternoon. Patient became upset at this, stating that this is ridiculous. She then threw her hands up and left the building.   Dr. Delores notified of patient departure.   Chiquita JAYSON English, RN

## 2024-03-26 NOTE — Telephone Encounter (Signed)
(  Submitted PA under [employer] - insurance didn't recognize prescribing provider [Suknaim, Kulkaew B, DO] name & NPI)  Pharmacy Patient Advocate Encounter   Received notification from CoverMyMeds that prior authorization for LIDOCAINE  5%PATCHES is required/requested.   Insurance verification completed.   The patient is insured through [facility].   PA required; PA submitted to above mentioned insurance via Latent Key/confirmation #/EOC [id number]. Status is pending

## 2024-03-28 ENCOUNTER — Inpatient Hospital Stay: Admission: RE | Admit: 2024-03-28 | Source: Ambulatory Visit

## 2024-03-28 NOTE — Telephone Encounter (Signed)
 Pharmacy Patient Advocate Encounter  Received notification from [employer] that Prior Authorization for LIDOCAINE  5% PATCHES has been DENIED.  Full denial letter will be uploaded to the media tab. See denial reason below.  LIDOCAINE  DIS 5% PATCH is not FDA approved for your medical condition(s): Acute pain on both knees. These condition(s) are not supported by one of the accepted references. Therefore your drug is denied because it is not being used for a medically accepted indication.  LIDOCAINE  4% PATCHES AVAILABLE OTC  PA #/Case ID/Reference #: [id number]

## 2024-03-30 ENCOUNTER — Ambulatory Visit (HOSPITAL_BASED_OUTPATIENT_CLINIC_OR_DEPARTMENT_OTHER): Attending: Primary Care | Admitting: Pulmonary Disease

## 2024-04-01 ENCOUNTER — Ambulatory Visit: Admitting: Pharmacist

## 2024-04-04 ENCOUNTER — Encounter: Payer: Self-pay | Admitting: Pharmacist

## 2024-04-09 ENCOUNTER — Ambulatory Visit
Admission: RE | Admit: 2024-04-09 | Discharge: 2024-04-09 | Disposition: A | Discharge status: Home / Self Care | Source: Ambulatory Visit | Attending: Family Medicine | Admitting: Family Medicine

## 2024-04-09 DIAGNOSIS — Z1231 Encounter for screening mammogram for malignant neoplasm of breast: Secondary | ICD-10-CM

## 2024-04-11 ENCOUNTER — Ambulatory Visit: Payer: Self-pay | Admitting: Family Medicine

## 2024-06-10 ENCOUNTER — Ambulatory Visit: Admitting: Podiatry

## 2024-09-25 ENCOUNTER — Encounter
